# Patient Record
Sex: Male | Born: 1955
Health system: Southern US, Community
[De-identification: ages and names within clinical notes are randomized; demographics above are authoritative.]

## PROBLEM LIST (undated history)

## (undated) DIAGNOSIS — I10 Essential (primary) hypertension: Secondary | ICD-10-CM

## (undated) DIAGNOSIS — E785 Hyperlipidemia, unspecified: Secondary | ICD-10-CM

## (undated) DIAGNOSIS — R200 Anesthesia of skin: Secondary | ICD-10-CM

## (undated) DIAGNOSIS — I2510 Atherosclerotic heart disease of native coronary artery without angina pectoris: Secondary | ICD-10-CM

## (undated) DIAGNOSIS — R351 Nocturia: Secondary | ICD-10-CM

## (undated) DIAGNOSIS — E119 Type 2 diabetes mellitus without complications: Secondary | ICD-10-CM

## (undated) DIAGNOSIS — I519 Heart disease, unspecified: Secondary | ICD-10-CM

## (undated) DIAGNOSIS — N400 Enlarged prostate without lower urinary tract symptoms: Secondary | ICD-10-CM

## (undated) DIAGNOSIS — C189 Malignant neoplasm of colon, unspecified: Secondary | ICD-10-CM

## (undated) DIAGNOSIS — I219 Acute myocardial infarction, unspecified: Secondary | ICD-10-CM

## (undated) DIAGNOSIS — D649 Anemia, unspecified: Secondary | ICD-10-CM

## (undated) DIAGNOSIS — G629 Polyneuropathy, unspecified: Secondary | ICD-10-CM

## (undated) DIAGNOSIS — R911 Solitary pulmonary nodule: Secondary | ICD-10-CM

## (undated) DIAGNOSIS — K859 Acute pancreatitis without necrosis or infection, unspecified: Secondary | ICD-10-CM

## (undated) DIAGNOSIS — Z9289 Personal history of other medical treatment: Secondary | ICD-10-CM

## (undated) HISTORY — PX: CORONARY ANGIOPLASTY: SHX604

## (undated) HISTORY — DX: Malignant neoplasm of colon, unspecified: C18.9

## (undated) HISTORY — PX: COLONOSCOPY: SHX174

## (undated) HISTORY — PX: APPENDECTOMY: SHX54

## (undated) HISTORY — DX: Heart disease, unspecified: I51.9

## (undated) HISTORY — PX: OTHER SURGICAL HISTORY: SHX169

---

## 1989-12-24 HISTORY — PX: HERNIA REPAIR: SHX51

## 2012-12-24 DIAGNOSIS — I219 Acute myocardial infarction, unspecified: Secondary | ICD-10-CM

## 2012-12-24 HISTORY — DX: Acute myocardial infarction, unspecified: I21.9

## 2013-06-27 HISTORY — PX: CORONARY STENT PLACEMENT: SHX1402

## 2014-12-24 DIAGNOSIS — C189 Malignant neoplasm of colon, unspecified: Secondary | ICD-10-CM

## 2014-12-24 HISTORY — PX: LAPAROSCOPIC RIGHT COLECTOMY: SHX5925

## 2014-12-24 HISTORY — DX: Malignant neoplasm of colon, unspecified: C18.9

## 2015-03-07 ENCOUNTER — Encounter: Payer: Self-pay | Admitting: Neurology

## 2015-03-07 ENCOUNTER — Ambulatory Visit (INDEPENDENT_AMBULATORY_CARE_PROVIDER_SITE_OTHER): Payer: Managed Care, Other (non HMO) | Admitting: Neurology

## 2015-03-07 VITALS — BP 116/67 | HR 76 | Ht 75.0 in | Wt 183.0 lb

## 2015-03-07 DIAGNOSIS — M5416 Radiculopathy, lumbar region: Secondary | ICD-10-CM | POA: Diagnosis not present

## 2015-03-07 DIAGNOSIS — G609 Hereditary and idiopathic neuropathy, unspecified: Secondary | ICD-10-CM

## 2015-03-07 MED ORDER — GABAPENTIN 300 MG PO CAPS: 300.0000 mg | ORAL_CAPSULE | Freq: Three times a day (TID) | ORAL | Status: DC

## 2015-03-07 NOTE — Patient Instructions (Addendum)
Overall you are doing fairly well but I do want to suggest a few things today:   Remember to drink plenty of fluid, eat healthy meals and do not skip any meals. Try to eat protein with a every meal and eat a healthy snack such as fruit or nuts in between meals. Try to keep a regular sleep-wake schedule and try to exercise daily, particularly in the form of walking, 20-30 minutes a day, if you can.   As far as your medications are concerned, I would like to suggest: Neurontin 300mg  up to three times daily  As far as diagnostic testing: MRI of the lumbar spine  I would like to see you back in 4 months, sooner if we need to. Please call us with any interim questions, concerns, problems, updates or refill requests.   Please discuss right carotid with cardiologist  Please also call us for any test results so we can go over those with you on the phone.  My clinical assistant and will answer any of your questions and relay your messages to me and also relay most of my messages to you.   Our phone number is [phone]. We also have an after hours call service for urgent matters and there is a physician on-call for urgent questions. For any emergencies you know to call 911 or go to the nearest emergency room

## 2015-03-07 NOTE — Progress Notes (Signed)
[facility]    Provider:  Dr Jaynee Eagles Referring Provider: [name], MD Primary Care Physician:  [name], MD  CC:  Feet feel cold  HPI:  Allen Glenn is a 59 y.o. male here as a referral from Dr. Olen Pel for neuropathy, PMHx CAD s/p MI and an LAD stent on plavix, HTN.   His left foot feels cold, like on a block of ice at the toes. Doesn't feel cold to the touch. Color stays good. Right foot is fine. Started a year ago, getting worse. He tries to keep 3-4 socks on, doesn't help, heating pad doesn't help. Worse at night. Denies weakness. He has low back pain for years since a teenager. Progressing. Worsening weakness. He is going to a Restaurant manager, fast food. He has mid-upper back pain which is new. No burning, tingling, numbness. Some cramping in the hands last year, resolved. No cramping in the feet. Balance is good. +urinary frequency. +radicular symptoms. No significant weight loss. No FHx of neuropathy or neuromuscular disorders.   Review of Systems: Patient complains of symptoms per HPI as well as the following symptoms: chills, weight loss, fatigue, easy bruising, easy bleeding, feeling cold, joint pain, aching muscles, itching, runny nose, weakness, dizziness, insomnia, decreased energy. Pertinent negatives per HPI. All others negative.   History   Social History  . Marital Status: Married    Spouse Name: [name]  . Number of Children: 2  . Years of Education: Bachelor   Occupational History  . Minister    Social History Main Topics  . Smoking status: Never Smoker   . Smokeless tobacco: Not on file  . Alcohol Use: No  . Drug Use: No  . Sexual Activity: Not on file   Other Topics Concern  . Not on file   Social History Narrative   Live at home with wife.   Caffeine: very little soda, occasional tea. 12oz-16oz/per    History reviewed. No pertinent family history.  Past Medical History  Diagnosis Date  . Heart disease     Past Surgical History    Procedure Laterality Date  . Coronary stent placement  06-27-2013    Current Outpatient Prescriptions  Medication Sig Dispense Refill  . aspirin EC 81 MG tablet Take 81 mg by mouth daily.    . clopidogrel (PLAVIX) 75 MG tablet Take 75 mg by mouth daily.    . Gabapentin, Once-Daily, 600 MG TABS Take 600 mg by mouth at bedtime. 30 tablet 6  . lisinopril (PRINIVIL,ZESTRIL) 2.5 MG tablet Take 2.5 mg by mouth daily.    . Multiple Vitamin (MULTIVITAMIN) capsule Take 1 capsule by mouth.    . multivitamin (METANX) 3-35-2 MG TABS tablet Take 1 tablet by mouth 2 (two) times daily. 60 tablet 6  . nitroGLYCERIN (NITROSTAT) 0.4 MG SL tablet 1 TAB UNDER TONGUE EVERY 5 MINUTES FOR 3 DOSES AS NEEDED FOR CHEST PAIN. CALL 911 IF PAIN PERSISTS    . tamsulosin (FLOMAX) 0.4 MG CAPS capsule Take 0.4 mg by mouth daily.    Marland Kitchen zolpidem (AMBIEN) 10 MG tablet Take 10 mg by mouth daily.     No current facility-administered medications for this visit.    Allergies as of 03/07/2015  . (No Known Allergies)    Vitals: BP 116/67 mmHg  Pulse 76  Ht 6\' 3"  (1.905 m)  Wt 183 lb (83.008 kg)  BMI 22.87 kg/m2 Last Weight:  Wt Readings from Last 1 Encounters:  03/07/15 183 lb (83.008 kg)   Last Height:  Ht Readings from Last 1 Encounters:  03/07/15 6\' 3"  (1.905 m)   Physical exam: Exam: Gen: NAD, conversant, well nourised, well groomed                     CV: RRR, no MRG. Right carotid bruit. No peripheral edema, warm, nontender Eyes: Conjunctivae clear without exudates or hemorrhage  Neuro: Detailed Neurologic Exam  Speech:    Speech is normal; fluent and spontaneous with normal comprehension.  Cognition:    The patient is oriented to person, place, and time;     recent and remote memory intact;     language fluent;     normal attention, concentration,     fund of knowledge Cranial Nerves:    The pupils are equal, round, and reactive to light. The fundi are flat. Visual fields are full to finger  confrontation. Extraocular movements are intact. Trigeminal sensation is intact and the muscles of mastication are normal. The face is symmetric. The palate elevates in the midline. Hearing intact. Voice is normal. Shoulder shrug is normal. The tongue has normal motion without fasciculations.   Coordination:    Normal finger to nose and heel to shin.   Gait:    Heel-toe intact, difficulty with tandem  Motor Observation:    No asymmetry, no atrophy, and no involuntary movements noted. Tone:    Normal muscle tone.    Posture:    Posture is normal.    Strength:    Strength is V/V in the upper and lower limbs.      Sensation: Decreased to all modalities in glove-and stocking distribution     Reflex Exam:  DTR's:    Absent achilles Toes:    The toes are downgoing bilaterally.   Clonus:    Clonus is absent.    Assessment/Plan:    Allen Glenn is a 59 y.o. male here as a referral from Dr. Olen Pel for neuropathy, PMHx CAD s/p MI and an LAD stent on plavix, HTN. 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 However symptoms per patient are asymmetric with chronic low back pain so should evaluate MRi of the lumbar spine for radiculopathy.   Will order a serum neuropathy panel MRI of the lumbar spine Will see if Neurontin will help paresthesias Metanx and alpha-lipoic acid may help if diabetic polyneuropathy Right carotid bruit heard, patient will follow up with his cardiologist about it, he has an upcoming appointment.  Sarina Ill, MD  [facility] [address] [place], [place] [address]  Phone [phone] Fax [phone]

## 2015-03-08 LAB — PAN-ANCA
ANCA Proteinase 3: <3.5 U/mL (ref 0.0–3.5)
Atypical pANCA: <1:20 {titer}
C-ANCA: <1:20 {titer}
Myeloperoxidase Ab: <9 U/mL (ref 0.0–9.0)
P-ANCA: <1:20 {titer}

## 2015-03-08 LAB — B. BURGDORFI ANTIBODIES: Lyme IgG/IgM Ab: <0.91 {ISR} (ref 0.00–0.90)

## 2015-03-10 LAB — COMPREHENSIVE METABOLIC PANEL
A/G RATIO: 1.9 (ref 1.1–2.5)
ALBUMIN: 4.4 g/dL (ref 3.5–5.5)
ALT: 15 IU/L (ref 0–44)
AST: 14 IU/L (ref 0–40)
Alkaline Phosphatase: 71 IU/L (ref 39–117)
BUN/Creatinine Ratio: 15 (ref 9–20)
BUN: 12 mg/dL (ref 6–24)
Bilirubin Total: 0.3 mg/dL (ref 0.0–1.2)
CALCIUM: 8.6 mg/dL — AB (ref 8.7–10.2)
CO2: 23 mmol/L (ref 18–29)
CREATININE: 0.8 mg/dL (ref 0.76–1.27)
Chloride: 103 mmol/L (ref 97–108)
GFR calc Af Amer: 114 mL/min/{1.73_m2} (ref >59)
GFR calc non Af Amer: 98 mL/min/{1.73_m2} (ref >59)
GLOBULIN, TOTAL: 2.3 g/dL (ref 1.5–4.5)
Glucose: 114 mg/dL — ABNORMAL HIGH (ref 65–99)
Potassium: 4.6 mmol/L (ref 3.5–5.2)
Sodium: 141 mmol/L (ref 134–144)
Total Protein: 6.7 g/dL (ref 6.0–8.5)

## 2015-03-10 LAB — B12 AND FOLATE PANEL
Folate: >20 ng/mL (ref >3.0)
Vitamin B-12: 421 pg/mL (ref 211–946)

## 2015-03-10 LAB — VITAMIN B1: Thiamine: 126.5 nmol/L (ref 66.5–200.0)

## 2015-03-10 LAB — MULTIPLE MYELOMA PANEL, SERUM
ALPHA2 GLOB SERPL ELPH-MCNC: 0.6 g/dL (ref 0.4–1.2)
Albumin SerPl Elph-Mcnc: 4 g/dL (ref 3.2–5.6)
Albumin/Glob SerPl: 1.5 (ref 0.7–2.0)
Alpha 1: 0.3 g/dL (ref 0.1–0.4)
B-Globulin SerPl Elph-Mcnc: 1.1 g/dL (ref 0.6–1.3)
GAMMA GLOB SERPL ELPH-MCNC: 0.8 g/dL (ref 0.5–1.6)
Globulin, Total: 2.7 g/dL (ref 2.0–4.5)
IgA/Immunoglobulin A, Serum: 105 mg/dL (ref 90–386)
IgG (Immunoglobin G), Serum: 842 mg/dL (ref 700–1600)
IgM (Immunoglobulin M), Srm: 16 mg/dL — ABNORMAL LOW (ref 20–172)

## 2015-03-10 LAB — ANGIOTENSIN CONVERTING ENZYME: Angio Convert Enzyme: 18 U/L (ref 14–82)

## 2015-03-10 LAB — HEPATITIS C ANTIBODY: Hep C Virus Ab: <0.1 s/co ratio (ref 0.0–0.9)

## 2015-03-10 LAB — ANA W/REFLEX: ANA: NEGATIVE

## 2015-03-10 LAB — HEMOGLOBIN A1C
ESTIMATED AVERAGE GLUCOSE: 137 mg/dL
Hgb A1c MFr Bld: 6.4 % — ABNORMAL HIGH (ref 4.8–5.6)

## 2015-03-10 LAB — VITAMIN B6: Vitamin B6: 13.7 ug/L (ref 5.3–46.7)

## 2015-03-10 LAB — HIV ANTIBODY (ROUTINE TESTING W REFLEX): HIV SCREEN 4TH GENERATION: NONREACTIVE

## 2015-03-10 LAB — TSH: TSH: 1.77 u[IU]/mL (ref 0.450–4.500)

## 2015-03-10 LAB — RPR: RPR: NONREACTIVE

## 2015-03-10 LAB — RHEUMATOID FACTOR: RHEUMATOID FACTOR: 9 [IU]/mL (ref 0.0–13.9)

## 2015-03-12 ENCOUNTER — Other Ambulatory Visit: Payer: Self-pay | Admitting: Neurology

## 2015-03-12 ENCOUNTER — Encounter: Payer: Self-pay | Admitting: Neurology

## 2015-03-12 ENCOUNTER — Telehealth: Payer: Self-pay | Admitting: Neurology

## 2015-03-12 DIAGNOSIS — E0842 Diabetes mellitus due to underlying condition with diabetic polyneuropathy: Secondary | ICD-10-CM

## 2015-03-12 DIAGNOSIS — E539 Vitamin B deficiency, unspecified: Secondary | ICD-10-CM

## 2015-03-12 MED ORDER — GABAPENTIN (ONCE-DAILY) 600 MG PO TABS: 600.0000 mg | ORAL_TABLET | Freq: Every day | ORAL | Status: DC

## 2015-03-12 MED ORDER — METANX 3-35-2 MG PO TABS: 1.0000 | ORAL_TABLET | Freq: Two times a day (BID) | ORAL | Status: DC

## 2015-03-12 NOTE — Telephone Encounter (Signed)
Spoke with patient. Labwork negative except HgbA1c 6.4 which can be contributing to neuropathy. Neurontin is helping, difficult to take 3x a day and wears off in between. Will try Gralise extended release. Will also prescribe metanx for diabetic neuropathy. And advised alha-lipoic acid 400-600mg  daily.  Allen Glenn - can you please follow up on his MRI approval and also schedule a follow up in 4 months when you call him to let him know about the MRI. Thank you.

## 2015-03-14 ENCOUNTER — Telehealth: Payer: Self-pay | Admitting: Neurology

## 2015-03-14 NOTE — Telephone Encounter (Signed)
[facility] - Would you please call patient and let them know the insurance denied MRI of the lumbar spine. They want [name] to do an emg.ncs first which I am happy to do on his left leg. He has polyneuropathy but an emg/ncs  can provide additional information on whether there is a pinched nerve on the left contributing to his symptoms. If we want an mrI of the lumbar spine, insurance says we need to do this test first. Please ask patient. It involves some electrical stimuli to examine the nerves in his legs and then I take a small needle/filament and examine muscles to see if I can find a pinched nerve in his back. Up to the patient. I can call and discuss if he likes as well. Just let him know. thanks

## 2015-03-14 NOTE — Telephone Encounter (Signed)
Talked with patient about insurance not covering the MRI lumbar/spine because they would like him to do the emg/ncs first. I talked with his about the ncs/emg procedure per Dr. Jaynee Eagles notes to see if he was interested and he was. But he wants [name] to check and see if insurance will cover it first before he agrees to it. I told him I would check for him and call him back tomorrow. Pt verbalized understanding.

## 2015-03-14 NOTE — Telephone Encounter (Signed)
John, Pharmacist with [facility] @ [phone], questioning Rx for GABAPENTIN 600 mg 1 tab daily.  Questioning if Extended Release or Immediate Release is needed.  Please call and advise.

## 2015-03-14 NOTE — Telephone Encounter (Signed)
Last phone note says: Neurontin is helping, difficult to take 3x a day and wears off in between. Will try Gralise extended release I called the pharmacy back.  Spoke with Joe.  Relayed providers message.  He will note patient file.

## 2015-03-14 NOTE — Telephone Encounter (Signed)
Talked with patient and told him I got the diagnosis codes so he could call his insurance company to find out the cost. The diagnosis code for EMG is 95886. The diagnosis codes range from [address]10, [place], [phone], and [phone] depending on how many nerves they test. Pt said he would call back tomorrow to get the codes because he was on the road right now. Please give diagnosis codes if he calls back.

## 2015-03-15 ENCOUNTER — Other Ambulatory Visit: Payer: Self-pay | Admitting: Neurology

## 2015-03-15 DIAGNOSIS — R202 Paresthesia of skin: Secondary | ICD-10-CM

## 2015-03-15 NOTE — Telephone Encounter (Signed)
Thank you. I ordered the emg/ncs just in case. So he can call and schedule at his convenience if insurance will pay.

## 2015-03-16 ENCOUNTER — Telehealth: Payer: Self-pay | Admitting: *Deleted

## 2015-03-16 NOTE — Telephone Encounter (Signed)
Called pt and gave him the diagnosis codes to call insurance company and see how much ncs/emg will cost. I told him to call back with any questions. Pt verbalized understanding.

## 2015-03-28 ENCOUNTER — Telehealth: Payer: Self-pay | Admitting: Neurology

## 2015-03-28 NOTE — Telephone Encounter (Signed)
Talked with pt and advised him to continue taking gabapentin 600 per Dr. Jaynee Eagles. Pt verbalized understanding.

## 2015-03-28 NOTE — Telephone Encounter (Signed)
Patient is calling in regard to NCV-EMG test tomorrow and would like to know if he should discontinue Rx Gabapentin 600.  Please call.

## 2015-03-28 NOTE — Telephone Encounter (Signed)
Agreed, Thank you

## 2015-03-29 ENCOUNTER — Ambulatory Visit (INDEPENDENT_AMBULATORY_CARE_PROVIDER_SITE_OTHER): Payer: Self-pay | Admitting: Neurology

## 2015-03-29 ENCOUNTER — Ambulatory Visit (INDEPENDENT_AMBULATORY_CARE_PROVIDER_SITE_OTHER): Payer: Managed Care, Other (non HMO) | Admitting: Neurology

## 2015-03-29 DIAGNOSIS — R202 Paresthesia of skin: Secondary | ICD-10-CM

## 2015-03-29 DIAGNOSIS — M5416 Radiculopathy, lumbar region: Secondary | ICD-10-CM

## 2015-03-29 DIAGNOSIS — R0989 Other specified symptoms and signs involving the circulatory and respiratory systems: Secondary | ICD-10-CM

## 2015-03-29 MED ORDER — PREGABALIN 150 MG PO CAPS: ORAL_CAPSULE | ORAL | Status: DC

## 2015-03-29 NOTE — Patient Instructions (Addendum)
Overall you are doing fairly well but I do want to suggest a few things today:   Remember to drink plenty of fluid, eat healthy meals and do not skip any meals. Try to eat protein with a every meal and eat a healthy snack such as fruit or nuts in between meals. Try to keep a regular sleep-wake schedule and try to exercise daily, particularly in the form of walking, 20-30 minutes a day, if you can.   As far as your medications are concerned, I would like to suggest: try lyrica 100 - 150mg  twice daily. Do not take with gabapentin.   As far as diagnostic testing: carotid dopplers, mri lumbar spine. [facility].   I would like to see you back as needed, sooner if we need to. Please call us with any interim questions, concerns, problems, updates or refill requests.   Please also call us for any test results so we can go over those with you on the phone.  My clinical assistant and will answer any of your questions and relay your messages to me and also relay most of my messages to you.   Our phone number is [phone]. We also have an after hours call service for urgent matters and there is a physician on-call for urgent questions. For any emergencies you know to call 911 or go to the nearest emergency room

## 2015-03-29 NOTE — Procedures (Signed)
  [facility]    Provider:  Dr Jaynee Eagles Referring Provider: [name], MD Primary Care Physician:  [name], MD   HPI: Allen Glenn is a 59 y.o. male here as a referral from Dr. Olen Pel for neuropathy, PMHx CAD s/p MI and an LAD stent on plavix, HTN.   His left foot feels cold, like on a block of ice at the toes. Doesn't feel cold to the touch. Color stays good. Right foot is fine. Started a year ago, getting worse. He tries to keep 3-4 socks on, doesn't help, heating pad doesn't help. Worse at night. Denies weakness. He has low back pain for years since a teenager. Progressing. Worsening weakness. He is going to a Restaurant manager, fast food. He has mid-upper back pain which is new. No burning, tingling, numbness. Some cramping in the hands last year, resolved. No cramping in the feet. Balance is good. +urinary frequency. +radicular symptoms. No significant weight loss. No FHx of neuropathy or neuromuscular disorders.   Summary: Nerve conduction studies were performed on the bilateral lower extremities.  Bilateral Peroneal and Tibial motor conductions were within normal limits with normal F Wave latencies.  Bilateral Sural and Peroneal sensory conductions were within normal limits Bilateral H Reflexes were within normal limits  EMG needle study was performed on selected left lower extremity muscles: The left  Gastrocnemius showed mildly increased spontaneous activity and reduced motor unit recruitment. The left Vastus Medialis, left Anterior Tibialis, left Extensor Hallucis Longus, Left Abductor Hallucis,  left Biceps Femoris (long  head), left Gluteus Medius, left Gluteus maximus muscles were within normal limits. Could not test the paraspinal deep muscles as patient is on Plavix and Aspirin which increases risk of bleeding.    Conclusion: This is an abnormal study. There are acute/ongoing denervation and chronic neurogenic changes seen in the left Medial Gastrocnemius. This  implies possible left radiculopathy with  S1 localization or may be due to neuropathy. Because of multiple anti-platelet agents, could not perform EMG needle study on paraspinal muscles to confirm radiculopathy. Suggest MRI of the lumbar spine. Clinical correlation recommended.     Sarina Ill, MD  [facility] [address] [place], [place] [address]  Phone [phone] Fax [phone]

## 2015-03-29 NOTE — Progress Notes (Signed)
  [facility]    Provider:  Dr Jaynee Eagles Referring Provider: [name], MD Primary Care Physician:  [name], MD   HPI: Allen Glenn is a 59 y.o. male here as a referral from Dr. Olen Pel for neuropathy, PMHx CAD s/p MI and an LAD stent on plavix, HTN.   His left foot feels cold, like on a block of ice at the toes. Doesn't feel cold to the touch. Color stays good. Right foot is fine. Started a year ago, getting worse. He tries to keep 3-4 socks on, doesn't help, heating pad doesn't help. Worse at night. Denies weakness. He has low back pain for years since a teenager. Progressing. Worsening weakness. He is going to a Restaurant manager, fast food. He has mid-upper back pain which is new. No burning, tingling, numbness. Some cramping in the hands last year, resolved. No cramping in the feet. Balance is good. +urinary frequency. +radicular symptoms. No significant weight loss. No FHx of neuropathy or neuromuscular disorders.   Summary: Nerve conduction studies were performed on the bilateral lower extremities.  Bilateral Peroneal and Tibial motor conductions were within normal limits with normal F Wave latencies.  Bilateral Sural and Peroneal sensory conductions were within normal limits Bilateral H Reflexes were within normal limits  EMG needle study was performed on selected left lower extremity muscles: The left  Gastrocnemius showed mildly increased spontaneous activity and reduced motor unit recruitment. The left Vastus Medialis, left Anterior Tibialis, left Extensor Hallucis Longus, Left Abductor Hallucis,  left Biceps Femoris (long  head), left Gluteus Medius, left Gluteus maximus muscles were within normal limits. Could not test the paraspinal deep muscles as patient is on Plavix and Aspirin which increases risk of bleeding.    Conclusion: This is an abnormal study. There are acute/ongoing denervation and chronic neurogenic changes seen in the left Medial Gastrocnemius. This  implies possible left radiculopathy with  S1 localization or may be due to neuropathy. Because of multiple anti-platelet agents, could not perform EMG needle study on paraspinal muscles to confirm radiculopathy. Suggest MRI of the lumbar spine. Clinical correlation recommended.     Sarina Ill, MD  [facility] [address] [place], [place] [address]  Phone [phone] Fax [phone]

## 2015-03-29 NOTE — Progress Notes (Signed)
See procedure note.

## 2015-04-06 ENCOUNTER — Ambulatory Visit (INDEPENDENT_AMBULATORY_CARE_PROVIDER_SITE_OTHER): Payer: Managed Care, Other (non HMO)

## 2015-04-06 DIAGNOSIS — R0989 Other specified symptoms and signs involving the circulatory and respiratory systems: Secondary | ICD-10-CM

## 2015-04-06 DIAGNOSIS — R202 Paresthesia of skin: Secondary | ICD-10-CM

## 2015-04-06 DIAGNOSIS — M5416 Radiculopathy, lumbar region: Secondary | ICD-10-CM | POA: Diagnosis not present

## 2015-04-09 ENCOUNTER — Observation Stay (HOSPITAL_COMMUNITY)
Admission: EM | Admit: 2015-04-09 | Discharge: 2015-04-10 | Disposition: A | Discharge status: Home / Self Care | Payer: Managed Care, Other (non HMO) | Attending: Internal Medicine | Admitting: Internal Medicine

## 2015-04-09 ENCOUNTER — Encounter (HOSPITAL_COMMUNITY): Payer: Self-pay | Admitting: Nurse Practitioner

## 2015-04-09 DIAGNOSIS — Z7982 Long term (current) use of aspirin: Secondary | ICD-10-CM | POA: Insufficient documentation

## 2015-04-09 DIAGNOSIS — Z955 Presence of coronary angioplasty implant and graft: Secondary | ICD-10-CM | POA: Insufficient documentation

## 2015-04-09 DIAGNOSIS — D649 Anemia, unspecified: Secondary | ICD-10-CM | POA: Insufficient documentation

## 2015-04-09 DIAGNOSIS — T39395A Adverse effect of other nonsteroidal anti-inflammatory drugs [NSAID], initial encounter: Secondary | ICD-10-CM

## 2015-04-09 DIAGNOSIS — D509 Iron deficiency anemia, unspecified: Secondary | ICD-10-CM | POA: Diagnosis not present

## 2015-04-09 DIAGNOSIS — K922 Gastrointestinal hemorrhage, unspecified: Secondary | ICD-10-CM | POA: Diagnosis not present

## 2015-04-09 DIAGNOSIS — I252 Old myocardial infarction: Secondary | ICD-10-CM | POA: Diagnosis not present

## 2015-04-09 DIAGNOSIS — R112 Nausea with vomiting, unspecified: Secondary | ICD-10-CM | POA: Diagnosis present

## 2015-04-09 DIAGNOSIS — I2510 Atherosclerotic heart disease of native coronary artery without angina pectoris: Secondary | ICD-10-CM | POA: Diagnosis present

## 2015-04-09 DIAGNOSIS — G609 Hereditary and idiopathic neuropathy, unspecified: Secondary | ICD-10-CM | POA: Diagnosis not present

## 2015-04-09 DIAGNOSIS — R5383 Other fatigue: Secondary | ICD-10-CM | POA: Diagnosis present

## 2015-04-09 DIAGNOSIS — I10 Essential (primary) hypertension: Secondary | ICD-10-CM | POA: Diagnosis not present

## 2015-04-09 DIAGNOSIS — R0683 Snoring: Secondary | ICD-10-CM | POA: Insufficient documentation

## 2015-04-09 HISTORY — DX: Atherosclerotic heart disease of native coronary artery without angina pectoris: I25.10

## 2015-04-09 HISTORY — DX: Acute myocardial infarction, unspecified: I21.9

## 2015-04-09 LAB — CBC
HCT: 28.6 % — ABNORMAL LOW (ref 39.0–52.0)
Hemoglobin: 7.4 g/dL — ABNORMAL LOW (ref 13.0–17.0)
MCH: 16.2 pg — ABNORMAL LOW (ref 26.0–34.0)
MCHC: 25.9 g/dL — ABNORMAL LOW (ref 30.0–36.0)
MCV: 62.6 fL — ABNORMAL LOW (ref 78.0–100.0)
Platelets: 250 10*3/uL (ref 150–400)
RBC: 4.57 MIL/uL (ref 4.22–5.81)
RDW: 17.8 % — ABNORMAL HIGH (ref 11.5–15.5)
WBC: 4 10*3/uL (ref 4.0–10.5)

## 2015-04-09 LAB — POC OCCULT BLOOD, ED: FECAL OCCULT BLD: NEGATIVE

## 2015-04-09 LAB — RETICULOCYTES
RBC.: 4.57 MIL/uL (ref 4.22–5.81)
RETIC CT PCT: 1.3 % (ref 0.4–3.1)
Retic Count, Absolute: 59.4 10*3/uL (ref 19.0–186.0)

## 2015-04-09 LAB — COMPREHENSIVE METABOLIC PANEL
ALBUMIN: 3.9 g/dL (ref 3.5–5.2)
ALK PHOS: 63 U/L (ref 39–117)
ALT: 15 U/L (ref 0–53)
AST: 25 U/L (ref 0–37)
Anion gap: 9 (ref 5–15)
BILIRUBIN TOTAL: 0.5 mg/dL (ref 0.3–1.2)
BUN: 14 mg/dL (ref 6–23)
CALCIUM: 8.5 mg/dL (ref 8.4–10.5)
CHLORIDE: 103 mmol/L (ref 96–112)
CO2: 24 mmol/L (ref 19–32)
Creatinine, Ser: 0.76 mg/dL (ref 0.50–1.35)
GFR calc Af Amer: >90 mL/min (ref >90)
GFR calc non Af Amer: >90 mL/min (ref >90)
Glucose, Bld: 102 mg/dL — ABNORMAL HIGH (ref 70–99)
POTASSIUM: 4.1 mmol/L (ref 3.5–5.1)
SODIUM: 136 mmol/L (ref 135–145)
Total Protein: 6.7 g/dL (ref 6.0–8.3)

## 2015-04-09 LAB — IRON AND TIBC
IRON: 24 ug/dL — AB (ref 42–165)
Saturation Ratios: 5 % — ABNORMAL LOW (ref 20–55)
TIBC: 501 ug/dL — AB (ref 215–435)
UIBC: 477 ug/dL — ABNORMAL HIGH (ref 125–400)

## 2015-04-09 LAB — VITAMIN B12: Vitamin B-12: 420 pg/mL (ref 211–911)

## 2015-04-09 LAB — FOLATE

## 2015-04-09 LAB — PREPARE RBC (CROSSMATCH)

## 2015-04-09 LAB — ABO/RH: ABO/RH(D): A NEG

## 2015-04-09 LAB — FERRITIN: Ferritin: 4 ng/mL — ABNORMAL LOW (ref 22–322)

## 2015-04-09 MED ORDER — SODIUM CHLORIDE 0.9 % IV SOLN: 10.0000 mL/h | Freq: Once | INTRAVENOUS | Status: DC

## 2015-04-09 MED ORDER — PREGABALIN 75 MG PO CAPS: 150.0000 mg | ORAL_CAPSULE | Freq: Every evening | ORAL | Status: DC | PRN

## 2015-04-09 MED ORDER — ASPIRIN EC 81 MG PO TBEC
81.0000 mg | DELAYED_RELEASE_TABLET | Freq: Every day | ORAL | Status: DC
  Administered 2015-04-10: 81 mg via ORAL
  Filled 2015-04-09 (×3): qty 1

## 2015-04-09 MED ORDER — GABAPENTIN (ONCE-DAILY) 600 MG PO TABS: 600.0000 mg | ORAL_TABLET | Freq: Every day | ORAL | Status: DC

## 2015-04-09 MED ORDER — METANX 3-35-2 MG PO TABS: 1.0000 | ORAL_TABLET | Freq: Two times a day (BID) | ORAL | Status: DC

## 2015-04-09 MED ORDER — CLOPIDOGREL BISULFATE 75 MG PO TABS
75.0000 mg | ORAL_TABLET | Freq: Every day | ORAL | Status: DC
  Administered 2015-04-10: 75 mg via ORAL
  Filled 2015-04-09 (×3): qty 1

## 2015-04-09 MED ORDER — TROLAMINE SALICYLATE 10 % EX CREA: 1.0000 "application " | TOPICAL_CREAM | Freq: Every day | CUTANEOUS | Status: DC

## 2015-04-09 MED ORDER — TAMSULOSIN HCL 0.4 MG PO CAPS
0.4000 mg | ORAL_CAPSULE | Freq: Every day | ORAL | Status: DC
  Administered 2015-04-10: 0.4 mg via ORAL
  Filled 2015-04-09 (×3): qty 1

## 2015-04-09 MED ORDER — ZOLPIDEM TARTRATE 5 MG PO TABS
10.0000 mg | ORAL_TABLET | Freq: Every day | ORAL | Status: DC
  Administered 2015-04-09: 10 mg via ORAL
  Filled 2015-04-09: qty 2

## 2015-04-09 MED ORDER — LISINOPRIL 2.5 MG PO TABS
2.5000 mg | ORAL_TABLET | Freq: Every day | ORAL | Status: DC
  Administered 2015-04-10: 2.5 mg via ORAL
  Filled 2015-04-09 (×3): qty 1

## 2015-04-09 MED ORDER — SODIUM CHLORIDE 0.9 % IJ SOLN: 3.0000 mL | Freq: Two times a day (BID) | INTRAMUSCULAR | Status: DC

## 2015-04-09 MED ORDER — PANTOPRAZOLE SODIUM 40 MG PO TBEC
40.0000 mg | DELAYED_RELEASE_TABLET | Freq: Two times a day (BID) | ORAL | Status: DC
  Administered 2015-04-09 – 2015-04-10 (×3): 40 mg via ORAL
  Filled 2015-04-09 (×2): qty 1

## 2015-04-09 NOTE — ED Notes (Signed)
PA at bedside.

## 2015-04-09 NOTE — ED Notes (Signed)
Patient presents to ED for further evaluation of hemoglobin of 6.9 that was obtained in pre-cardiac catheterization blood work. Cath is scheduled for Friday for evaluation for intermittent chest pain, and worsening dyspnea and fatigue. Patient states symptoms have been progressive over the past five months. Patient endorses intermittent dizziness with position changes. Patient pale, able to speak full sentences, skin warm and dry    Had 1 stent placed in 2015

## 2015-04-09 NOTE — ED Notes (Signed)
Attempted report 

## 2015-04-09 NOTE — ED Provider Notes (Signed)
CSN: [id number]     Arrival date & time 04/09/15  0940 History   First MD Initiated Contact with Patient 04/09/15 [phone]     Chief Complaint  Patient presents with  . low hemoglobin      (Consider location/radiation/quality/duration/timing/severity/associated sxs/prior Treatment) The history is provided by the patient and medical records.    This is a 59 year old male with past medical history significant for coronary artery disease, MI status post stenting, hypertension, presenting to the ED for low hemoglobin. Patient states he was seen earlier this morning for pre-cardiac catheterization blood work and was noted that his hemoglobin was low at 6.9. His baseline is around 12-13.  He denies any melena or bright red blood per rectum. He is on aspirin and Plavix. No issues with anemia in the past. He had a colonoscopy a few years ago which was normal per his reports. Patient and wife report that over the past 2 months he has had generalized weakness, fatigue, and low energy.  He is having cardiac cath for exertional chest pain and dyspnea which is been ongoing for the past 2 years.  He denies any current chest pain, shortness of breath, abdominal pain, nausea, or vomiting.  Past Medical History  Diagnosis Date  . Heart disease   . Coronary artery disease   . Acute MI 2014    acute ST elevation MI   Past Surgical History  Procedure Laterality Date  . Coronary stent placement  06-27-2013   Family History  Problem Relation Age of Onset  . Neuropathy Neg Hx    History  Substance Use Topics  . Smoking status: Never Smoker   . Smokeless tobacco: Not on file  . Alcohol Use: No    Review of Systems  Constitutional: Positive for activity change and fatigue.  Neurological: Positive for weakness (general).  All other systems reviewed and are negative.     Allergies  Review of patient's allergies indicates no known allergies.  Home Medications   Prior to Admission medications    Medication Sig Start Date End Date Taking? Authorizing Provider  ALPHA LIPOIC ACID PO Take 1 capsule by mouth daily.   Yes Historical Provider, MD  aspirin EC 81 MG tablet Take 81 mg by mouth daily. 06/30/13  Yes Historical Provider, MD  Calcium-Magnesium-Vitamin D (CALCIUM MAGNESIUM PO) Take 1 tablet by mouth daily.   Yes Historical Provider, MD  clopidogrel (PLAVIX) 75 MG tablet Take 75 mg by mouth daily. 02/16/15  Yes Historical Provider, MD  lisinopril (PRINIVIL,ZESTRIL) 2.5 MG tablet Take 2.5 mg by mouth daily. 02/16/15  Yes Historical Provider, MD  Multiple Vitamin (MULTIVITAMIN) capsule Take 1 capsule by mouth.   Yes Historical Provider, MD  pregabalin (LYRICA) 50 MG capsule Take 50-150 mg by mouth 2 (two) times daily.   Yes Historical Provider, MD  tamsulosin (FLOMAX) 0.4 MG CAPS capsule Take 0.4 mg by mouth daily. 02/24/15  Yes Historical Provider, MD  trolamine salicylate (ASPERCREME) 10 % cream Apply 1 application topically daily. To back   Yes Historical Provider, MD  zolpidem (AMBIEN) 10 MG tablet Take 10 mg by mouth daily. 12/27/14  Yes Historical Provider, MD  Gabapentin, Once-Daily, 600 MG TABS Take 600 mg by mouth at bedtime. Patient not taking: Reported on 04/09/2015 03/12/15   Melvenia Beam, MD  multivitamin (METANX) 3-35-2 MG TABS tablet Take 1 tablet by mouth 2 (two) times daily. Patient not taking: Reported on 04/09/2015 03/12/15   Melvenia Beam, MD  nitroGLYCERIN (NITROSTAT) 0.4  MG SL tablet 1 TAB UNDER TONGUE EVERY 5 MINUTES FOR 3 DOSES AS NEEDED FOR CHEST PAIN. CALL 911 IF PAIN PERSISTS 11/24/14   Historical Provider, MD  pregabalin (LYRICA) 150 MG capsule Take 50 to 150mg  twice daily Patient not taking: Reported on 04/09/2015 03/29/15   Melvenia Beam, MD   BP 121/59 mmHg  Pulse 75  Temp(Src) 98 F (36.7 C) (Oral)  Resp 16  Ht 6\' 2"  (1.88 m)  Wt 182 lb (82.555 kg)  BMI 23.36 kg/m2  SpO2 100%   Physical Exam  Constitutional: He is oriented to person, place, and time. He  appears well-developed and well-nourished. No distress.  HENT:  Head: Normocephalic and atraumatic.  Mouth/Throat: Oropharynx is clear and moist.  Eyes: Conjunctivae and EOM are normal. Pupils are equal, round, and reactive to light.  Conjunctiva pale  Neck: Normal range of motion. Neck supple.  Cardiovascular: Normal rate, regular rhythm and normal heart sounds.   Pulmonary/Chest: Effort normal and breath sounds normal. No respiratory distress. He has no wheezes.  Abdominal: Soft. Bowel sounds are normal. There is no tenderness. There is no guarding.  Musculoskeletal: Normal range of motion. He exhibits no edema.  Neurological: He is alert and oriented to person, place, and time.  Skin: Skin is warm and dry. He is not diaphoretic.  Psychiatric: He has a normal mood and affect.  Nursing note and vitals reviewed.   ED Course  Procedures (including critical care time)  CRITICAL CARE Performed by: Larene Pickett   Total critical care time: 35  Critical care time was exclusive of separately billable procedures and treating other patients.  Critical care was necessary to treat or prevent imminent or life-threatening deterioration.  Critical care was time spent personally by me on the following activities: development of treatment plan with patient and/or surrogate as well as nursing, discussions with consultants, evaluation of patient's response to treatment, examination of patient, obtaining history from patient or surrogate, ordering and performing treatments and interventions, ordering and review of laboratory studies, ordering and review of radiographic studies, pulse oximetry and re-evaluation of patient's condition.  Medications  0.9 %  sodium chloride infusion (not administered)    Labs Review Labs Reviewed  CBC - Abnormal; Notable for the following:    Hemoglobin 7.4 (*)    HCT 28.6 (*)    MCV 62.6 (*)    MCH 16.2 (*)    MCHC 25.9 (*)    RDW 17.8 (*)    All other  components within normal limits  COMPREHENSIVE METABOLIC PANEL - Abnormal; Notable for the following:    Glucose, Bld 102 (*)    All other components within normal limits  RETICULOCYTES  VITAMIN B12  FOLATE  IRON AND TIBC  FERRITIN  POC OCCULT BLOOD, ED  TYPE AND SCREEN  ABO/RH  PREPARE RBC (CROSSMATCH)    Imaging Review No results found.   EKG Interpretation   Date/Time:  Saturday April 09 2015 09:48:52 EDT Ventricular Rate:  73 PR Interval:  201 QRS Duration: 93 QT Interval:  389 QTC Calculation: 429 R Axis:   49 Text Interpretation:  Poor data quality Sinus rhythm Baseline wander  Artifact No old tracing to compare Confirmed by [facility]  MD, Nunzio Cory  [phone]) on 04/09/2015 10:18:04 AM      MDM   Final diagnoses:  Symptomatic anemia  Other fatigue   59 year old male with low hemoglobin on outpatient labs prior to cardiac catherization. Hemoglobin was noted to be 6.9, his baseline  is around 12-13 based on prior lab values in chart. Patient denies any blood in stool. He does report fatigue, generalized weakness, and decreased energy. Labwork as above, hemoglobin 7.4. Hemoccult is negative. Patient is not orthostatic. Anemia panel pending. Given the patient is symptomatic and is a precipitous drop from his baseline, feel he would benefit from blood transfusion and admission for further management. This was discussed with patient and his wife who are in agreement. Questions regarding blood transfusion were answered, consent obtained.  2 units type and screened.  Case discussed with hospitalist, Dr. Verlon Au who will admit for observation.  Temp admission orders placed, VS remain stable.  Larene Pickett, PA-C 04/09/15 [place], PA-C 04/09/15 [place], DO 04/10/15 1356

## 2015-04-09 NOTE — H&P (Signed)
[facility] History and Physical  Allen Glenn MWN:[id number] DOB: 1956/01/01 DOA: 04/09/2015  Referring physician: ED PCP: [name], MD  Specialists:  none  Chief Complaint:  59 y/o ? 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 Congenital diastematomyelia + LDDD-follwed by Dr. Jaynee Eagles of Neurology [W/u in process] Went to [facility]. [facility] for pre-cardiac cath labs. He is been feeling weak and tired loss of energy and washed out with exertion over the past 6 months. He is been seen by another physician Dr. Maureen Chatters 2015 and had some lab work done which is not available. He actually underwent a stress test in December 2015 which was within normal limits His wife is an Therapist, sports and gives supplementary history. She states that he has been tired for "long time"  Hb 7.4, MCV 62 Chem-7 wnl   Review of Systems:  - Chest pain + fatigue as above - Hematemesis, - diarrhea, - blurred vision, - double vision, - unilateral loss of strength, - fever, - chills, - sinus issues,  - Lower extremity swelling, - orthopnea, - PND   + Occasional nausea-has come to the point where he has emesis 1-2 times a day for the past 6 months + Aversion to foods and smell causes the nausea - Dysphagia, - odynophagia, - dysphagia to various types of foods, - sore throat   Past Medical History  Diagnosis Date  . Heart disease   . Coronary artery disease   . Acute MI 2014    acute ST elevation MI   Past Surgical History  Procedure Laterality Date  . Coronary stent placement  06-27-2013   Social History:  History   Social History Narrative   Live at home with wife.   Caffeine: very little soda, occasional tea. 12oz-16oz/per    No Known Allergies  Family History  Problem Relation Age of Onset  . Neuropathy Neg Hx     Prior to Admission medications   Medication Sig Start Date End Date  Taking? Authorizing Provider  ALPHA LIPOIC ACID PO Take 1 capsule by mouth daily.   Yes Historical Provider, MD  aspirin EC 81 MG tablet Take 81 mg by mouth daily. 06/30/13  Yes Historical Provider, MD  Calcium-Magnesium-Vitamin D (CALCIUM MAGNESIUM PO) Take 1 tablet by mouth daily.   Yes Historical Provider, MD  clopidogrel (PLAVIX) 75 MG tablet Take 75 mg by mouth daily. 02/16/15  Yes Historical Provider, MD  lisinopril (PRINIVIL,ZESTRIL) 2.5 MG tablet Take 2.5 mg by mouth daily. 02/16/15  Yes Historical Provider, MD  Multiple Vitamin (MULTIVITAMIN) capsule Take 1 capsule by mouth.   Yes Historical Provider, MD  pregabalin (LYRICA) 50 MG capsule Take 50-150 mg by mouth 2 (two) times daily.   Yes Historical Provider, MD  tamsulosin (FLOMAX) 0.4 MG CAPS capsule Take 0.4 mg by mouth daily. 02/24/15  Yes Historical Provider, MD  trolamine salicylate (ASPERCREME) 10 % cream Apply 1 application topically daily. To back   Yes Historical Provider, MD  zolpidem (AMBIEN) 10 MG tablet Take 10 mg by mouth daily. 12/27/14  Yes Historical Provider, MD  Gabapentin, Once-Daily, 600 MG TABS Take 600 mg by mouth at bedtime. Patient not taking: Reported on 04/09/2015 03/12/15   Melvenia Beam, MD  multivitamin (METANX) 3-35-2 MG TABS tablet Take 1 tablet by mouth 2 (two) times daily. Patient not taking: Reported on 04/09/2015 03/12/15   Melvenia Beam, MD  nitroGLYCERIN (NITROSTAT)  0.4 MG SL tablet 1 TAB UNDER TONGUE EVERY 5 MINUTES FOR 3 DOSES AS NEEDED FOR CHEST PAIN. CALL 911 IF PAIN PERSISTS 11/24/14   Historical Provider, MD  pregabalin (LYRICA) 150 MG capsule Take 50 to 150mg  twice daily Patient not taking: Reported on 04/09/2015 03/29/15   Melvenia Beam, MD   Physical Exam: Filed Vitals:   04/09/15 0950 04/09/15 1100 04/09/15 1236 04/09/15 1238  BP: 121/59  126/52   Pulse: 75  71   Temp: 98 F (36.7 C)   97.8 F (36.6 C)  TempSrc: Oral Oral  Oral  Resp: 16  20   Height: 6\' 2"  (1.88 m)     Weight: 82.555 kg  (182 lb)     SpO2: 100%  100%     EOMI ruddy complexion, mild pallor, no icterus Neck soft supple, no bruit, no JVD S1-S2?murmur left lower sternal edge,  CTA B no added sound Abdomen soft nontender nondistended no rebound no epigastric tenderness no splenomegaly no organomegaly bowel sounds heard Lower extremities is soft supple  Labs on Admission:  Basic Metabolic Panel:  Recent Labs Lab 04/09/15 0957  NA 136  K 4.1  CL 103  CO2 24  GLUCOSE 102*  BUN 14  CREATININE 0.76  CALCIUM 8.5   Liver Function Tests:  Recent Labs Lab 04/09/15 0957  AST 25  ALT 15  ALKPHOS 63  BILITOT 0.5  PROT 6.7  ALBUMIN 3.9   No results for input(s): LIPASE, AMYLASE in the last 168 hours. No results for input(s): AMMONIA in the last 168 hours. CBC:  Recent Labs Lab 04/09/15 0957  WBC 4.0  HGB 7.4*  HCT 28.6*  MCV 62.6*  PLT 250   Cardiac Enzymes: No results for input(s): CKTOTAL, CKMB, CKMBINDEX, TROPONINI in the last 168 hours.  BNP (last 3 results) No results for input(s): BNP in the last 8760 hours.  ProBNP (last 3 results) No results for input(s): PROBNP in the last 8760 hours.  CBG: No results for input(s): GLUCAP in the last 168 hours.  Radiological Exams on Admission: No results found.  EKG: Independently reviewed. Sinus rhythm, PR interval 0.20, QRS axis 50, poor quality EKG and wandering baseline,  Assessment/Plan Principal Problem:   GI bleed due to NSAIDs-probably secondary to use of Plavix and aspirin. He does not take any other nonsteroidal's that I'm able to tell. We will transfuse him 2 units packed red blood cells over 4 hours each unit repeat labs in the morning I will place him on Protonix twice a day 40 mg to see if this makes any difference If he still has nausea or other issues he may need outpatient workup with gastroenterology for this nausea His LFTs are normal and I do not think he has any current concerns for GI or hepatic pathology such as  cholecystitis Active Problems:   Hereditary and idiopathic peripheral neuropathy--MRI copy from recent workup given to wife by nursing   CAD s/p PCI LAD 06/2013 [facility] continue lisinopril 2.5, Plavix, aspirin-consider outpatient metoprolol initiation if thought necessary   Nausea & vomiting--potential PUD with pyloric ulcer given that he states to me that  his symptoms are 4 at his hunger and discomfort and then he vomits 1-2 hours subsequent .    Snores--However on Lyrica/Ambien--outpatient follow-up    65 minutes  Full CODE STATUS  Long and detailed discussion with wife who is an Barista, [facility]   If 7PM-7AM, please contact night-coverage [email or web address] Password  [email or web address] 04/09/2015, 12:56 PM

## 2015-04-10 DIAGNOSIS — D509 Iron deficiency anemia, unspecified: Secondary | ICD-10-CM

## 2015-04-10 DIAGNOSIS — I25118 Atherosclerotic heart disease of native coronary artery with other forms of angina pectoris: Secondary | ICD-10-CM | POA: Diagnosis not present

## 2015-04-10 DIAGNOSIS — R112 Nausea with vomiting, unspecified: Secondary | ICD-10-CM | POA: Diagnosis not present

## 2015-04-10 DIAGNOSIS — G609 Hereditary and idiopathic neuropathy, unspecified: Secondary | ICD-10-CM

## 2015-04-10 LAB — TYPE AND SCREEN
ABO/RH(D): A NEG
Antibody Screen: NEGATIVE
Unit division: 0
Unit division: 0

## 2015-04-10 LAB — CBC
HCT: 29.1 % — ABNORMAL LOW (ref 39.0–52.0)
Hemoglobin: 8.1 g/dL — ABNORMAL LOW (ref 13.0–17.0)
MCH: 18.2 pg — ABNORMAL LOW (ref 26.0–34.0)
MCHC: 27.8 g/dL — ABNORMAL LOW (ref 30.0–36.0)
MCV: 65.4 fL — ABNORMAL LOW (ref 78.0–100.0)
Platelets: 205 10*3/uL (ref 150–400)
RBC: 4.45 MIL/uL (ref 4.22–5.81)
RDW: 20.2 % — ABNORMAL HIGH (ref 11.5–15.5)
WBC: 4.8 10*3/uL (ref 4.0–10.5)

## 2015-04-10 LAB — PROTIME-INR
INR: 1.03 (ref 0.00–1.49)
PROTHROMBIN TIME: 13.6 s (ref 11.6–15.2)

## 2015-04-10 MED ORDER — SODIUM CHLORIDE 0.9 % IV SOLN
25.0000 mg | Freq: Once | INTRAVENOUS | Status: AC
  Administered 2015-04-10: 25 mg via INTRAVENOUS
  Filled 2015-04-10: qty 0.5

## 2015-04-10 MED ORDER — IRON POLYSACCH CMPLX-B12-FA 150-0.025-1 MG PO CAPS: 1.0000 | ORAL_CAPSULE | Freq: Every day | ORAL | Status: DC

## 2015-04-10 MED ORDER — SODIUM CHLORIDE 0.9 % IV SOLN
100.0000 mg | Freq: Once | INTRAVENOUS | Status: DC
  Administered 2015-04-10: 100 mg via INTRAVENOUS
  Filled 2015-04-10: qty 2

## 2015-04-10 MED ORDER — SODIUM CHLORIDE 0.9 % IV SOLN
750.0000 mg | Freq: Every day | INTRAVENOUS | Status: AC
  Administered 2015-04-10 (×2): 750 mg via INTRAVENOUS
  Filled 2015-04-10: qty 15

## 2015-04-10 MED ORDER — OMEPRAZOLE MAGNESIUM 20 MG PO TBEC: 20.0000 mg | DELAYED_RELEASE_TABLET | Freq: Every day | ORAL | Status: DC

## 2015-04-10 NOTE — Progress Notes (Signed)
Discharged to home with family office visits in place teaching done  

## 2015-04-10 NOTE — Progress Notes (Signed)
UR completed 

## 2015-04-10 NOTE — Discharge Summary (Signed)
Discharge Summary  Allen Glenn [id number] DOB: [birth year]  PCP: [name], MD  Admit date: 04/09/2015 Discharge date: 04/10/2015  Time spent: 35 minutes  Recommendations for Outpatient Follow-up:  1. New medication: Prilosec 20 mg by mouth daily 2. New medication: Niferex 150 mg by mouth daily 3. Patient has a scheduled appointment with his PCP Dr. Doyle Askew tomorrow, 4/18. Recommending for patient to get referral for gastroenterology and possibly hematology workup.   Discharge Diagnoses:  Active Hospital Problems   Diagnosis Date Noted  . Iron deficiency anemia 04/09/2015  . CAD s/p PCI LAD 06/2013 [facility] 04/09/2015  . Nausea & vomiting 04/09/2015  . Hereditary and idiopathic peripheral neuropathy 03/07/2015    Resolved Hospital Problems   Diagnosis Date Noted Date Resolved  No resolved problems to display.    Discharge Condition: Improved, being discharged home  Diet recommendation: Heart healthy  Filed Weights   04/09/15 0950 04/10/15 0536  Weight: 82.555 kg (182 lb) 81 kg (178 lb 9.2 oz)    History of present illness:  Patient is a 59 year old male with past history of CAD status post PCI 2 years ago who according to the last documented hemoglobin in December 2014 had normal hemoglobin with normal MCV. Patient started to complain of overall feeling of fatigue and decreased exercise tolerance and overall functional decline in the last 6 months. Wife felt like he was getting more pale in the past few months. Labs were done including basic metabolic panel and liver function tests which were unrevealing last month. Patient*having chest pain and followed up with his cardiologist at [facility]. At that time CBC was done and patient's hemoglobin came back at 6.9. This was called and the patient and he was sent over to the emergency room. There hemoglobin was confirmed to be low at 7.4. Is also noted patient's MCV was 63. The rest of his labs including white blood cell  count, platelet count, electrolytes and renal function were all normal as well as. Patient's blood pressure and heart rate were also stable, indicating that this was more of a chronic process and not necessarily bleed. Hemoccult negative.  Hospital Course:  Principal Problem:   Iron deficiency anemia: Patient transfused 2 units packed red blood cells. His hemoglobin only improved at 8.1. Iron studies noted a low iron of 24, ferritin of 4 and TIBC of 501 CONSISTENT with severe iron deficiency. The patient underwent a colonoscopy 8 years ago which was normal other than a benign polyp. Suspect that either patient had a slow chronic bleeding process over the last year which is depleted his iron deficiency or this is more of a production problem. Spoke with hematology and prior to discharge, patient received IV iron infusion and will be started on an iron supplement. He has a follow-up  scheduled with his PCP tomorrow which he should keep, and at that time can get a referral to a gastroenterologist here. Prior to any hematology workup, patient needs a repeat scope to confirm no bleeding, even subacute or chronic.  If this is unrevealing, hematology workup would be in order. I am less concerned about cancer given benign screening 8 years ago. Also suspect the patient's chest pain may be from critical anemia causing mild ischemia. With blood transfusion and reversal, he may not have any further cardiac issues, but this can be decided by his cardiologist. Given no signs of acute GI bleeding, first keep patient on patent his aspirin and Plavix. Nevertheless, we'll add PPI   Active Problems:  Hereditary and idiopathic peripheral neuropathy: Annual problem that is in the process of being worked up. In the end, it may be that anemia may cause patient to have some vasospasm issues and secondary sensation of neuropathy. Would be interesting to see if his neuropathic symptoms resolve as his hemoglobin trends upward over  time   CAD s/p PCI LAD 06/2013 [facility]: As above   Nausea & vomiting: Likely secondary to critical anemia   Procedures:  None  Consultations:  None  Discharge Exam: BP 122/58 mmHg  Pulse 70  Temp(Src) 98.6 F (37 C) (Oral)  Resp 20  Ht 6\' 2"  (1.88 m)  Wt 81 kg (178 lb 9.2 oz)  BMI 22.92 kg/m2  SpO2 98%  General: Alert and oriented 3, no acute distress Cardiovascular: Regular rate and rhythm, S1-S2 Respiratory: Clear to auscultation bilaterally  Discharge Instructions You were cared for by a hospitalist during your hospital stay. If you have any questions about your discharge medications or the care you received while you were in the hospital after you are discharged, you can call the unit and asked to speak with the hospitalist on call if the hospitalist that took care of you is not available. Once you are discharged, your primary care physician will handle any further medical issues. Please note that NO REFILLS for any discharge medications will be authorized once you are discharged, as it is imperative that you return to your primary care physician (or establish a relationship with a primary care physician if you do not have one) for your aftercare needs so that they can reassess your need for medications and monitor your lab values.  Discharge Instructions    Diet - low sodium heart healthy    Complete by:  As directed      Increase activity slowly    Complete by:  As directed             Medication List    STOP taking these medications        Gabapentin (Once-Daily) 600 MG Tabs     multivitamin 3-35-2 MG Tabs tablet     pregabalin 150 MG capsule  Commonly known as:  LYRICA     pregabalin 50 MG capsule  Commonly known as:  LYRICA     trolamine salicylate 10 % cream  Commonly known as:  ASPERCREME      TAKE these medications        ALPHA LIPOIC ACID PO  Take 1 capsule by mouth daily.     aspirin EC 81 MG tablet  Take 81 mg by mouth daily.      CALCIUM MAGNESIUM PO  Take 1 tablet by mouth daily.     clopidogrel 75 MG tablet  Commonly known as:  PLAVIX  Take 75 mg by mouth daily.     Iron Polysacch Cmplx-B12-FA 150-0.025-1 MG Caps  Take 1 tablet by mouth daily.     lisinopril 2.5 MG tablet  Commonly known as:  PRINIVIL,ZESTRIL  Take 2.5 mg by mouth daily.     multivitamin capsule  Take 1 capsule by mouth.     nitroGLYCERIN 0.4 MG SL tablet  Commonly known as:  NITROSTAT  1 TAB UNDER TONGUE EVERY 5 MINUTES FOR 3 DOSES AS NEEDED FOR CHEST PAIN. CALL 911 IF PAIN PERSISTS     omeprazole 20 MG tablet  Commonly known as:  PRILOSEC OTC  Take 1 tablet (20 mg total) by mouth daily.     tamsulosin 0.4 MG Caps  capsule  Commonly known as:  FLOMAX  Take 0.4 mg by mouth daily.     zolpidem 10 MG tablet  Commonly known as:  AMBIEN  Take 10 mg by mouth daily.       No Known Allergies    The results of significant diagnostics from this hospitalization (including imaging, microbiology, ancillary and laboratory) are listed below for reference.    Significant Diagnostic Studies: Mr Lumbar Spine Wo Contrast  04/07/2015   [facility]  NEUROIMAGING REPORT   STUDY DATE: 04/06/15 PATIENT NAME: Allen Glenn DOB: Jan 29, 1956 MRN: [id number]  ORDERING CLINICIAN: Sarina Ill, MD  CLINICAL HISTORY: 59 year old male with back pain and numbness.  EXAM: MRI lumbar spine (without)  TECHNIQUE: MRI of the lumbar spine was obtained utilizing 4 mm sagittal  slices from Y77-41 down to the lower sacrum with T1, T2 and inversion  recovery views. In addition 4 mm axial slices from O8-7 down to L5-S1  level were included with T1 and T2 weighted views. CONTRAST: no IMAGING SITE: [facility] [address] (1.5 Tesla MRI)   FINDINGS:  On sagittal views the vertebral bodies have normal height and alignment.  Mild disc bulging from L1-2 to L5-S1. Small annular tears at L4-5 and  L5-S1. The conus medullaris terminates at the level of T12-L1. On  axial  views, there is suggestion of diastematomyelia (series 6 image 1) but this  is not definite. Small hemangioma at L3.   On axial views:  L1-2: no spinal stenosis or foraminal narrowing  L2-3: no spinal stenosis or foraminal narrowing  L3-4: no spinal stenosis or foraminal narrowing  L4-5: disc bulging and facet hypertrophy with mild biforaminal stenosis  L5-S1: disc bulging and facet hypertrophy with moderate right and mild  left foraminal stenosis   Limited views of the aorta, kidneys, iliopsoas muscles and sacroiliac  joints are unremarkable.    04/07/2015   Abnormal MRI lumbar spine (without) demonstrating: 1. At L5-S1: disc bulging and facet hypertrophy with moderate right and  mild left foraminal stenosis  2. At L4-5: disc bulging and facet hypertrophy with mild biforaminal  stenosis  3. 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 No  evidence of tethered cord, fatty filum terminale or other spinal  dysraphism.    INTERPRETING PHYSICIAN:  Penni Bombard, MD Certified in Neurology, Neurophysiology and Neuroimaging  [facility] [address] [place], [place] [address] [phone]    Microbiology: No results found for this or any previous visit (from the past 240 hour(s)).   Labs: Basic Metabolic Panel:  Recent Labs Lab 04/09/15 0957  NA 136  K 4.1  CL 103  CO2 24  GLUCOSE 102*  BUN 14  CREATININE 0.76  CALCIUM 8.5   Liver Function Tests:  Recent Labs Lab 04/09/15 0957  AST 25  ALT 15  ALKPHOS 63  BILITOT 0.5  PROT 6.7  ALBUMIN 3.9   No results for input(s): LIPASE, AMYLASE in the last 168 hours. No results for input(s): AMMONIA in the last 168 hours. CBC:  Recent Labs Lab 04/09/15 0957 04/10/15 0530  WBC 4.0 4.8  HGB 7.4* 8.1*  HCT 28.6* 29.1*  MCV 62.6* 65.4*  PLT 250 205   Cardiac Enzymes: No results for input(s): CKTOTAL,  CKMB, CKMBINDEX, TROPONINI in the last 168 hours. BNP: BNP (last 3 results) No results for input(s): BNP in the last 8760  hours.  ProBNP (last 3 results) No results for input(s): PROBNP in the last 8760 hours.  CBG: No results for input(s): GLUCAP in the last 168 hours.     Signed:  Annita Brod  [facility] 04/10/2015, 2:21 PM

## 2015-04-13 ENCOUNTER — Telehealth: Payer: Self-pay | Admitting: Neurology

## 2015-04-13 ENCOUNTER — Other Ambulatory Visit: Payer: Self-pay | Admitting: Neurology

## 2015-04-13 MED ORDER — PREGABALIN 50 MG PO CAPS: 50.0000 mg | ORAL_CAPSULE | Freq: Three times a day (TID) | ORAL | Status: DC

## 2015-04-13 NOTE — Telephone Encounter (Signed)
Discussed MRI l-spine. With patient   IMPRESSION:  Abnormal MRI lumbar spine (without) demonstrating: 1. At L5-S1: disc bulging and facet hypertrophy with moderate right and mild left foraminal stenosis  2. At L4-5: disc bulging and facet hypertrophy with mild biforaminal stenosis  3. 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 No evidence of tethered cord, fatty filum terminale or other spinal dysraphism.  The paresthesias in his left foot likely neuropathy. He is still having low back pain but without radiculopathy. Will increase lyrica.

## 2015-04-13 NOTE — Telephone Encounter (Signed)
Dr. Leonie Man - This patient had a carotid Doppler completed on the 13th. Do we have results yet? Thank you.

## 2015-04-14 NOTE — Telephone Encounter (Signed)
Faxed Rx for Lyrica 50mg  to pt pharmacy at 8:20am on 04/14/15. Received fax confirmation.

## 2015-04-21 ENCOUNTER — Other Ambulatory Visit: Payer: Managed Care, Other (non HMO)

## 2015-04-26 ENCOUNTER — Telehealth: Payer: Self-pay | Admitting: *Deleted

## 2015-04-26 NOTE — Telephone Encounter (Signed)
Spoke with patient about normal carotid duplex results/no plaque. Pt verbalized understanding. Pt also wanted to let Dr. Jaynee Eagles know he was recently diagnosed with colon cancer. He said he is scheduled for surgery. Dr. Jaynee Eagles is aware. I told patient if he needed anything to call and we would be happy to help. Pt verbalized understanding.

## 2015-05-03 DIAGNOSIS — I10 Essential (primary) hypertension: Secondary | ICD-10-CM | POA: Diagnosis present

## 2015-09-28 HISTORY — PX: LIVER LOBECTOMY: SHX1976

## 2015-11-10 ENCOUNTER — Encounter: Payer: Self-pay | Admitting: *Deleted

## 2015-11-10 ENCOUNTER — Other Ambulatory Visit: Payer: Self-pay | Admitting: Neurology

## 2015-11-10 NOTE — Telephone Encounter (Signed)
Dr Jaynee Eagles is out of the office.  Request forwarded to [facility] for review.

## 2015-11-10 NOTE — Progress Notes (Signed)
Lyrica rx. up front [facility]

## 2015-11-18 ENCOUNTER — Other Ambulatory Visit: Payer: Self-pay | Admitting: Neurology

## 2015-11-23 ENCOUNTER — Other Ambulatory Visit: Payer: Self-pay

## 2015-12-29 ENCOUNTER — Telehealth: Payer: Self-pay | Admitting: *Deleted

## 2015-12-29 NOTE — Telephone Encounter (Signed)
Rx for Lyrica printed 11/10/15 - never picked up by patient - script destroyed.

## 2016-04-13 MED FILL — TAMSULOSIN HCL 0.4 MG CAP: 0.4 | 30 days supply | Qty: 30 | Fill #0

## 2016-04-13 MED FILL — LYRICA 75 MG CAPSULE: 75 | 30 days supply | Qty: 60 | Fill #0

## 2016-04-13 MED FILL — LISINOPRIL 2.5 MG TABLET: 2.5 | 90 days supply | Qty: 90 | Fill #0

## 2016-04-13 MED FILL — CLOPIDOGREL 75 MG TABLET: 75 | 90 days supply | Qty: 90 | Fill #0

## 2016-04-17 ENCOUNTER — Other Ambulatory Visit (HOSPITAL_COMMUNITY): Payer: Self-pay | Admitting: Physician Assistant

## 2016-04-17 DIAGNOSIS — C189 Malignant neoplasm of colon, unspecified: Secondary | ICD-10-CM

## 2016-04-17 DIAGNOSIS — C787 Secondary malignant neoplasm of liver and intrahepatic bile duct: Principal | ICD-10-CM

## 2016-04-23 DIAGNOSIS — J309 Allergic rhinitis, unspecified: Secondary | ICD-10-CM | POA: Diagnosis not present

## 2016-04-30 DIAGNOSIS — N35013 Post-traumatic anterior urethral stricture: Secondary | ICD-10-CM | POA: Diagnosis not present

## 2016-04-30 DIAGNOSIS — N359 Urethral stricture, unspecified: Secondary | ICD-10-CM | POA: Diagnosis not present

## 2016-04-30 DIAGNOSIS — R39198 Other difficulties with micturition: Secondary | ICD-10-CM | POA: Diagnosis not present

## 2016-05-07 ENCOUNTER — Ambulatory Visit (HOSPITAL_COMMUNITY): Payer: Managed Care, Other (non HMO)

## 2016-05-07 ENCOUNTER — Ambulatory Visit (HOSPITAL_COMMUNITY)
Admission: RE | Admit: 2016-05-07 | Discharge: 2016-05-07 | Disposition: A | Discharge status: Home / Self Care | Payer: 59 | Source: Ambulatory Visit | Attending: Physician Assistant | Admitting: Physician Assistant

## 2016-05-07 ENCOUNTER — Encounter (HOSPITAL_COMMUNITY): Payer: Self-pay

## 2016-05-07 DIAGNOSIS — R160 Hepatomegaly, not elsewhere classified: Secondary | ICD-10-CM | POA: Diagnosis not present

## 2016-05-07 DIAGNOSIS — N289 Disorder of kidney and ureter, unspecified: Secondary | ICD-10-CM | POA: Insufficient documentation

## 2016-05-07 DIAGNOSIS — C787 Secondary malignant neoplasm of liver and intrahepatic bile duct: Secondary | ICD-10-CM | POA: Insufficient documentation

## 2016-05-07 DIAGNOSIS — K8020 Calculus of gallbladder without cholecystitis without obstruction: Secondary | ICD-10-CM | POA: Diagnosis not present

## 2016-05-07 DIAGNOSIS — I2510 Atherosclerotic heart disease of native coronary artery without angina pectoris: Secondary | ICD-10-CM | POA: Diagnosis not present

## 2016-05-07 DIAGNOSIS — C189 Malignant neoplasm of colon, unspecified: Secondary | ICD-10-CM | POA: Diagnosis not present

## 2016-05-07 DIAGNOSIS — K7689 Other specified diseases of liver: Secondary | ICD-10-CM | POA: Diagnosis not present

## 2016-05-07 DIAGNOSIS — R918 Other nonspecific abnormal finding of lung field: Secondary | ICD-10-CM | POA: Diagnosis not present

## 2016-05-07 MED ORDER — IOPAMIDOL (ISOVUE-300) INJECTION 61%
INTRAVENOUS | Status: AC
  Administered 2016-05-07: 30 mL
  Filled 2016-05-07: qty 75

## 2016-05-07 MED ORDER — IOPAMIDOL (ISOVUE-300) INJECTION 61%
INTRAVENOUS | Status: AC
  Administered 2016-05-07: 75 mL
  Filled 2016-05-07: qty 30

## 2016-05-14 DIAGNOSIS — C787 Secondary malignant neoplasm of liver and intrahepatic bile duct: Secondary | ICD-10-CM | POA: Diagnosis not present

## 2016-05-14 DIAGNOSIS — C189 Malignant neoplasm of colon, unspecified: Secondary | ICD-10-CM | POA: Diagnosis not present

## 2016-05-23 ENCOUNTER — Encounter: Payer: Self-pay | Admitting: Interventional Cardiology

## 2016-05-23 DIAGNOSIS — Z23 Encounter for immunization: Secondary | ICD-10-CM | POA: Diagnosis not present

## 2016-05-23 DIAGNOSIS — D649 Anemia, unspecified: Secondary | ICD-10-CM | POA: Diagnosis not present

## 2016-05-23 DIAGNOSIS — C787 Secondary malignant neoplasm of liver and intrahepatic bile duct: Secondary | ICD-10-CM | POA: Diagnosis not present

## 2016-05-23 DIAGNOSIS — C189 Malignant neoplasm of colon, unspecified: Secondary | ICD-10-CM | POA: Diagnosis not present

## 2016-05-23 DIAGNOSIS — E782 Mixed hyperlipidemia: Secondary | ICD-10-CM | POA: Diagnosis not present

## 2016-05-23 DIAGNOSIS — N400 Enlarged prostate without lower urinary tract symptoms: Secondary | ICD-10-CM | POA: Diagnosis not present

## 2016-05-23 DIAGNOSIS — I2510 Atherosclerotic heart disease of native coronary artery without angina pectoris: Secondary | ICD-10-CM | POA: Diagnosis not present

## 2016-05-23 DIAGNOSIS — M792 Neuralgia and neuritis, unspecified: Secondary | ICD-10-CM | POA: Diagnosis not present

## 2016-05-23 DIAGNOSIS — I10 Essential (primary) hypertension: Secondary | ICD-10-CM | POA: Diagnosis not present

## 2016-06-11 ENCOUNTER — Other Ambulatory Visit (HOSPITAL_COMMUNITY): Payer: Self-pay | Admitting: Hematology and Oncology

## 2016-06-11 DIAGNOSIS — C189 Malignant neoplasm of colon, unspecified: Secondary | ICD-10-CM

## 2016-06-11 DIAGNOSIS — C787 Secondary malignant neoplasm of liver and intrahepatic bile duct: Principal | ICD-10-CM

## 2016-06-12 NOTE — Progress Notes (Signed)
Cardiology Office Note    Date:  06/13/2016   ID:  Allen Glenn, DOB [birth year], MRN [id number]  PCP:  [name], MD  Cardiologist: Sinclair Grooms, MD   Chief Complaint  Patient presents with  . Coronary Artery Disease    History of Present Illness:  Allen Glenn is a 60 y.o. 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  The patient suffered acute anterior non-ST elevation myocardial infarction 06/27/2013. He was treated at [facility] by Dr. Clovia Cuff where he received a 3.0 x 16 Promus Premier DES reducing a 98% stenosis to 0% with TIMI grade 3 flow. 60% nonobstructive LAD lesion was noted just distal to the second diagonal. LVEF was 35% acutely but according to the wife improved to greater than 50% prior to discharge from the hospital by echo.  The patient has been asymptomatic since the acute intervention. He subsequently developed severe anemia which was worked up and found to be related to colon cancer. He underwent resection of the cancer but was also noted to have a solitary liver metastasis. This was treated with resection and chemotherapy. Last treatments were in November 2016. No recurrence of anemia. His physical activities are increasing. Appetite is normal. He has stopped taking statin therapy and is not been following a prudent diet.    Past Medical History  Diagnosis Date  . Heart disease   . Coronary artery disease   . Acute MI ([place]) 2014    acute ST elevation MI  . Colon cancer ([place]) 06/13/2016    Status post resection of colon mass is well as he panic metastasis.     Past Surgical History  Procedure Laterality Date  . Coronary stent placement  06-27-2013    Current Medications: Outpatient Prescriptions Prior to Visit  Medication Sig Dispense Refill  . aspirin  EC 81 MG tablet Take 81 mg by mouth daily.    . Calcium-Magnesium-Vitamin D (CALCIUM MAGNESIUM PO) Take 1 tablet by mouth daily.    . clopidogrel (PLAVIX) 75 MG tablet Take 75 mg by mouth daily.    . Iron Polysacch Cmplx-B12-FA 150-0.025-1 MG CAPS Take 1 tablet by mouth daily. 30 each 3  . lisinopril (PRINIVIL,ZESTRIL) 2.5 MG tablet Take 2.5 mg by mouth daily.    Marland Kitchen LYRICA 50 MG capsule TAKE ONE CAPSULE BY MOUTH 3 TIMES A DAY 90 capsule 1  . Multiple Vitamin (MULTIVITAMIN) capsule Take 1 capsule by mouth.    . nitroGLYCERIN (NITROSTAT) 0.4 MG SL tablet 1 TAB UNDER TONGUE EVERY 5 MINUTES FOR 3 DOSES AS NEEDED FOR CHEST PAIN. CALL 911 IF PAIN PERSISTS    . tamsulosin (FLOMAX) 0.4 MG CAPS capsule Take 0.4 mg by mouth daily.    . ALPHA LIPOIC ACID PO Take 1 capsule by mouth daily. Reported on 06/13/2016    . omeprazole (PRILOSEC OTC) 20 MG tablet Take 1 tablet (20 mg total) by mouth daily. (Patient not taking: Reported on 06/13/2016) 30 tablet 1  . zolpidem (AMBIEN) 10 MG tablet Take 10 mg by mouth daily. Reported on 06/13/2016     No facility-administered medications prior to visit.     Allergies:   Review of patient's allergies indicates no known allergies.   Social History   Social History  . Marital Status: Married    Spouse Name: [name]  .  Number of Children: 2  . Years of Education: Bachelor   Occupational History  . Minister    Social History Main Topics  . Smoking status: Never Smoker   . Smokeless tobacco: Never Used  . Alcohol Use: No  . Drug Use: No  . Sexual Activity: Not Asked   Other Topics Concern  . None   Social History Narrative   Live at home with wife.   Caffeine: very little soda, occasional tea. 12oz-16oz/per     Family History:  The patient's family history includes Heart disease in his father and mother; Hypertension in his father and mother. There is no history of Neuropathy.   ROS:   Please see the history of present illness.    Muscle aches and statin  therapy  All other systems reviewed and are negative.   PHYSICAL EXAM:   VS:  BP 116/68 mmHg  Pulse 75  Ht 6\' 3"  (1.905 m)  Wt 202 lb 12.8 oz (91.989 kg)  BMI 25.35 kg/m2   GEN: Well nourished, well developed, in no acute distress HEENT: normal Neck: no JVD, carotid bruits, or masses Cardiac: RRR; no murmurs, rubs, or gallops,no edema  Respiratory:  clear to auscultation bilaterally, normal work of breathing GI: soft, nontender, nondistended, + BS MS: no deformity or atrophy Skin: warm and dry, no rash Neuro:  Alert and Oriented x 3, Strength and sensation are intact Psych: euthymic mood, full affect  Wt Readings from Last 3 Encounters:  06/13/16 202 lb 12.8 oz (91.989 kg)  04/10/15 178 lb 9.2 oz (81 kg)  04/05/15 183 lb (83.008 kg)      Studies/Labs Reviewed:   EKG:  EKG  Normal sinus rhythm with overall normal appearance.  Recent Labs: No results found for requested labs within last 365 days.   Lipid Panel No results found for: CHOL, TRIG, HDL, CHOLHDL, VLDL, LDLCALC, LDLDIRECT  Additional studies/ records that were reviewed today include:   The most recent lipid panel performed on 05/23/16 [facility] a direct LDL cholesterol of 126 total cholesterol of 246, triglyceride of 526, an HDL of 41. Creatinine is normal at 0.77    ASSESSMENT:    1. Coronary artery disease involving native coronary artery with other forms of angina pectoris ([place])   2. Hyperlipidemia   3. Malignant neoplasm of ascending colon ([place])   4. Chronic diastolic heart failure (HCC)      PLAN:  In order of problems listed above:  1. Aerobic activity, aspirin 81 mg per day, and statin therapy needs to be reinstituted. He will also try diet alone before committing to statin therapy. His heart is been stressed by hemoglobin less than 5 and several gastrointestinal operations within the past 8 months without any cardiovascular complications. Overall left fill his heart is been doing quite well.  Plavix is been discontinued for quite some time. 2. Liver and lipid panel will be done fasting in 6 weeks. If LDL is still greater than 100*low-dose statin therapy and follow-up. 4.   No evidence of volume overload. No recent LVEF assessment.LVEF prior to discharge from the hospital was 55% by transthoracic echo haven't improved from 35% acutely.    Medication Adjustments/Labs and Tests Ordered: Current medicines are reviewed at length with the patient today.  Concerns regarding medicines are outlined above.  Medication changes, Labs and Tests ordered today are listed in the Patient Instructions below. Patient Instructions  Medication Instructions:   NO CHANGE  Labwork:  Your physician recommends that you  return for lab work in: 6 WEEKS= DO NOT EAT PRIOR TO LAB WORK  Follow-Up:  Your physician wants you to follow-up in: [place] will receive a reminder letter in the mail two months in advance. If you don't receive a letter, please call our office to schedule the follow-up appointment.          Signed, Sinclair Grooms, MD  06/13/2016 4:47 PM    [facility] [place], [place], [place]  [address] Phone: [phone]; Fax: [phone]

## 2016-06-13 ENCOUNTER — Encounter: Payer: Self-pay | Admitting: Interventional Cardiology

## 2016-06-13 ENCOUNTER — Ambulatory Visit (INDEPENDENT_AMBULATORY_CARE_PROVIDER_SITE_OTHER): Payer: 59 | Admitting: Interventional Cardiology

## 2016-06-13 VITALS — BP 116/68 | HR 75 | Ht 75.0 in | Wt 202.8 lb

## 2016-06-13 DIAGNOSIS — I5032 Chronic diastolic (congestive) heart failure: Secondary | ICD-10-CM | POA: Insufficient documentation

## 2016-06-13 DIAGNOSIS — E782 Mixed hyperlipidemia: Secondary | ICD-10-CM | POA: Insufficient documentation

## 2016-06-13 DIAGNOSIS — C189 Malignant neoplasm of colon, unspecified: Secondary | ICD-10-CM | POA: Insufficient documentation

## 2016-06-13 DIAGNOSIS — I25118 Atherosclerotic heart disease of native coronary artery with other forms of angina pectoris: Secondary | ICD-10-CM | POA: Diagnosis not present

## 2016-06-13 DIAGNOSIS — C182 Malignant neoplasm of ascending colon: Secondary | ICD-10-CM | POA: Diagnosis not present

## 2016-06-13 DIAGNOSIS — E785 Hyperlipidemia, unspecified: Secondary | ICD-10-CM | POA: Diagnosis not present

## 2016-06-13 NOTE — Patient Instructions (Signed)
Medication Instructions:   NO CHANGE  Labwork:  Your physician recommends that you return for lab work in: 6 WEEKS= DO NOT EAT PRIOR TO LAB WORK  Follow-Up:  Your physician wants you to follow-up in: [place] will receive a reminder letter in the mail two months in advance. If you don't receive a letter, please call our office to schedule the follow-up appointment.

## 2016-06-18 ENCOUNTER — Encounter (HOSPITAL_COMMUNITY): Payer: Self-pay | Admitting: Emergency Medicine

## 2016-06-18 ENCOUNTER — Ambulatory Visit (HOSPITAL_COMMUNITY)
Admission: EM | Admit: 2016-06-18 | Discharge: 2016-06-18 | Disposition: A | Discharge status: Home / Self Care | Payer: 59 | Attending: Family Medicine | Admitting: Family Medicine

## 2016-06-18 DIAGNOSIS — T148 Other injury of unspecified body region: Secondary | ICD-10-CM

## 2016-06-18 DIAGNOSIS — W57XXXA Bitten or stung by nonvenomous insect and other nonvenomous arthropods, initial encounter: Secondary | ICD-10-CM

## 2016-06-18 MED ORDER — FLUTICASONE PROPIONATE 0.05 % EX CREA: TOPICAL_CREAM | Freq: Two times a day (BID) | CUTANEOUS | Status: DC

## 2016-06-18 NOTE — ED Provider Notes (Signed)
CSN: [id number]     Arrival date & time 06/18/16  1534 History   None    Chief Complaint  Patient presents with  . Insect Bite  . Rash   (Consider location/radiation/quality/duration/timing/severity/associated sxs/prior Treatment) Patient is a 60 y.o. male presenting with rash. The history is provided by the patient and the spouse.  Rash Location:  Head/neck Head/neck rash location:  L neck Quality: blistering, dryness and itchiness   Severity:  Mild Onset quality:  Sudden Duration:  1 day Progression:  Spreading Chronicity:  New Context: insect bite/sting   Relieved by:  None tried Worsened by:  Nothing tried Ineffective treatments:  None tried Associated symptoms: no fever, no throat swelling and no tongue swelling     Past Medical History  Diagnosis Date  . Heart disease   . Coronary artery disease   . Acute MI ([place]) 2014    acute ST elevation MI  . Colon cancer ([place]) 06/13/2016    Status post resection of colon mass is well as he panic metastasis.    Past Surgical History  Procedure Laterality Date  . Coronary stent placement  06-27-2013   Family History  Problem Relation Age of Onset  . Neuropathy Neg Hx   . Hypertension Mother   . Heart disease Mother   . Hypertension Father   . Heart disease Father    Social History  Substance Use Topics  . Smoking status: Never Smoker   . Smokeless tobacco: Never Used  . Alcohol Use: No    Review of Systems  Constitutional: Negative.  Negative for fever.  Skin: Positive for rash.  All other systems reviewed and are negative.   Allergies  Review of patient's allergies indicates no known allergies.  Home Medications   Prior to Admission medications   Medication Sig Start Date End Date Taking? Authorizing Provider  aspirin EC 81 MG tablet Take 81 mg by mouth daily. 06/30/13  Yes Historical Provider, MD  clopidogrel (PLAVIX) 75 MG tablet Take 75 mg by mouth daily. 02/16/15  Yes Historical Provider, MD  lisinopril  (PRINIVIL,ZESTRIL) 2.5 MG tablet Take 2.5 mg by mouth daily. 02/16/15  Yes Historical Provider, MD  LYRICA 50 MG capsule TAKE ONE CAPSULE BY MOUTH 3 TIMES A DAY 11/10/15  Yes Marcial Pacas, MD  tamsulosin (FLOMAX) 0.4 MG CAPS capsule Take 0.4 mg by mouth daily. 02/24/15  Yes Historical Provider, MD  Calcium-Magnesium-Vitamin D (CALCIUM MAGNESIUM PO) Take 1 tablet by mouth daily.    Historical Provider, MD  fluticasone (CUTIVATE) 0.05 % cream Apply topically 2 (two) times daily. 06/18/16   Billy Fischer, MD  Iron Polysacch Cmplx-B12-FA 150-0.025-1 MG CAPS Take 1 tablet by mouth daily. 04/10/15   Annita Brod, MD  Multiple Vitamin (MULTIVITAMIN) capsule Take 1 capsule by mouth.    Historical Provider, MD  nitroGLYCERIN (NITROSTAT) 0.4 MG SL tablet 1 TAB UNDER TONGUE EVERY 5 MINUTES FOR 3 DOSES AS NEEDED FOR CHEST PAIN. CALL 911 IF PAIN PERSISTS 11/24/14   Historical Provider, MD   Meds Ordered and Administered this Visit  Medications - No data to display  BP 137/75 mmHg  Pulse 70  Temp(Src) 98 F (36.7 C) (Oral)  Resp 16  SpO2 97% No data found.   Physical Exam  Constitutional: He is oriented to person, place, and time. He appears well-developed and well-nourished. No distress.  Pulmonary/Chest: Effort normal and breath sounds normal.  Abdominal: Soft. Bowel sounds are normal.  Musculoskeletal: Normal range of motion.  Neurological:  He is alert and oriented to person, place, and time.  Skin: Skin is warm and dry. Rash noted. There is erythema.  Papular patchy dermatitis to left side of neck, no infection  Nursing note and vitals reviewed.   ED Course  Procedures (including critical care time)  Labs Review Labs Reviewed - No data to display  Imaging Review No results found.   Visual Acuity Review  Right Eye Distance:   Left Eye Distance:   Bilateral Distance:    Right Eye Near:   Left Eye Near:    Bilateral Near:         MDM   1. Multiple insect bites         Billy Fischer, MD 06/18/16 [phone]

## 2016-06-18 NOTE — ED Notes (Signed)
The patient presented to the [facility] with a complaint of a possible bug bite and associated rash that occurred on the back of his neck yesterday.

## 2016-07-09 ENCOUNTER — Ambulatory Visit (HOSPITAL_COMMUNITY)
Admission: RE | Admit: 2016-07-09 | Discharge: 2016-07-09 | Disposition: A | Discharge status: Home / Self Care | Payer: 59 | Source: Ambulatory Visit | Attending: Hematology and Oncology | Admitting: Hematology and Oncology

## 2016-07-09 DIAGNOSIS — N281 Cyst of kidney, acquired: Secondary | ICD-10-CM | POA: Insufficient documentation

## 2016-07-09 DIAGNOSIS — R160 Hepatomegaly, not elsewhere classified: Secondary | ICD-10-CM | POA: Insufficient documentation

## 2016-07-09 DIAGNOSIS — R932 Abnormal findings on diagnostic imaging of liver and biliary tract: Secondary | ICD-10-CM | POA: Insufficient documentation

## 2016-07-09 DIAGNOSIS — C787 Secondary malignant neoplasm of liver and intrahepatic bile duct: Secondary | ICD-10-CM | POA: Insufficient documentation

## 2016-07-09 DIAGNOSIS — C189 Malignant neoplasm of colon, unspecified: Secondary | ICD-10-CM | POA: Insufficient documentation

## 2016-07-09 LAB — POCT I-STAT CREATININE: Creatinine, Ser: 0.9 mg/dL (ref 0.61–1.24)

## 2016-07-09 MED ORDER — GADOBENATE DIMEGLUMINE 529 MG/ML IV SOLN
20.0000 mL | Freq: Once | INTRAVENOUS | Status: AC
  Administered 2016-07-09: 20 mL via INTRAVENOUS

## 2016-07-12 DIAGNOSIS — C787 Secondary malignant neoplasm of liver and intrahepatic bile duct: Secondary | ICD-10-CM | POA: Diagnosis not present

## 2016-07-12 DIAGNOSIS — C189 Malignant neoplasm of colon, unspecified: Secondary | ICD-10-CM | POA: Diagnosis not present

## 2016-07-25 ENCOUNTER — Encounter (INDEPENDENT_AMBULATORY_CARE_PROVIDER_SITE_OTHER): Payer: Self-pay

## 2016-07-25 ENCOUNTER — Other Ambulatory Visit (INDEPENDENT_AMBULATORY_CARE_PROVIDER_SITE_OTHER): Payer: 59

## 2016-07-25 ENCOUNTER — Telehealth: Payer: Self-pay | Admitting: Interventional Cardiology

## 2016-07-25 DIAGNOSIS — I25118 Atherosclerotic heart disease of native coronary artery with other forms of angina pectoris: Secondary | ICD-10-CM | POA: Diagnosis not present

## 2016-07-25 LAB — LIPID PANEL
Cholesterol: 203 mg/dL — ABNORMAL HIGH (ref 125–200)
HDL: 44 mg/dL (ref >=40)
LDL CALC: 123 mg/dL (ref <130)
Total CHOL/HDL Ratio: 4.6 Ratio (ref <=5.0)
Triglycerides: 178 mg/dL — ABNORMAL HIGH (ref <150)
VLDL: 36 mg/dL — ABNORMAL HIGH (ref <30)

## 2016-07-25 NOTE — Telephone Encounter (Signed)
New message         The pt wants to speak with a nurse about the lab results.

## 2016-07-26 ENCOUNTER — Telehealth: Payer: Self-pay | Admitting: Interventional Cardiology

## 2016-07-26 DIAGNOSIS — E785 Hyperlipidemia, unspecified: Secondary | ICD-10-CM

## 2016-07-26 MED ORDER — ATORVASTATIN CALCIUM 10 MG PO TABS: 10.0000 mg | ORAL_TABLET | Freq: Every day | ORAL | 6 refills | Status: DC

## 2016-07-26 NOTE — Telephone Encounter (Signed)
We forward back to Dr. Tamala Julian. Dr. Tamala Julian can you please put findings in this phone note  -- we cannot see your comments for lipid panel.  Please route comments to triage when completed. Thanks

## 2016-07-26 NOTE — Telephone Encounter (Signed)
Please see results from yesterday's tests. I've commented on them

## 2016-07-26 NOTE — Telephone Encounter (Signed)
Spoke with pt and reviewed results and recommendations.  Pt in agreement with plan.  Verified pharmacy and sent prescription.  Labs scheduled for 9/19.

## 2016-07-26 NOTE — Telephone Encounter (Signed)
New message     The pt is calling to get his lab results from yesterday

## 2016-07-26 NOTE — Telephone Encounter (Signed)
Advised patient that results not reviewed by physician. Pt given preliminary results. He understands someone will call him back with Dr. Thompson Caul review/advisement. Will forward to Dr. Tamala Julian to review lab result.

## 2016-07-26 NOTE — Telephone Encounter (Signed)
Start atorvastatin 10 mg per day. Repeat liver and lipid panel in 6-8 weeks.

## 2016-07-27 MED FILL — ATORVASTATIN 10 MG TABLET: 10 | 30 days supply | Qty: 30 | Fill #0

## 2016-07-27 NOTE — Telephone Encounter (Signed)
Pt has already been made aware of his lab results and Dr.Smith's recommendation. Rx has been sent to his pharmacy and 6 wk fasting lab appt has been scheduled.  Nothing further needed at this time

## 2016-07-27 NOTE — Telephone Encounter (Signed)
-----   Message from [name], MD sent at 07/26/2016  1:59 PM EDT ----- Regarding: Lipids I accidentally closed his lab without comment. He has elevated LDL and should be on statin, Atorvastatin 10 mg daily. Repeat a liver and lipid panel in 6 weeks.

## 2016-08-13 DIAGNOSIS — I10 Essential (primary) hypertension: Secondary | ICD-10-CM | POA: Diagnosis not present

## 2016-08-13 DIAGNOSIS — N281 Cyst of kidney, acquired: Secondary | ICD-10-CM | POA: Diagnosis not present

## 2016-08-13 DIAGNOSIS — Z7982 Long term (current) use of aspirin: Secondary | ICD-10-CM | POA: Diagnosis not present

## 2016-08-13 DIAGNOSIS — I252 Old myocardial infarction: Secondary | ICD-10-CM | POA: Diagnosis not present

## 2016-08-13 DIAGNOSIS — G629 Polyneuropathy, unspecified: Secondary | ICD-10-CM | POA: Diagnosis not present

## 2016-08-13 DIAGNOSIS — C189 Malignant neoplasm of colon, unspecified: Secondary | ICD-10-CM | POA: Diagnosis not present

## 2016-08-13 DIAGNOSIS — I2510 Atherosclerotic heart disease of native coronary artery without angina pectoris: Secondary | ICD-10-CM | POA: Diagnosis not present

## 2016-08-13 DIAGNOSIS — C787 Secondary malignant neoplasm of liver and intrahepatic bile duct: Secondary | ICD-10-CM | POA: Diagnosis not present

## 2016-08-13 DIAGNOSIS — R21 Rash and other nonspecific skin eruption: Secondary | ICD-10-CM | POA: Diagnosis not present

## 2016-08-13 DIAGNOSIS — G609 Hereditary and idiopathic neuropathy, unspecified: Secondary | ICD-10-CM | POA: Diagnosis not present

## 2016-08-14 MED FILL — CLOPIDOGREL 75 MG TABLET: 75 | 90 days supply | Qty: 90 | Fill #0

## 2016-08-20 MED FILL — TAMSULOSIN HCL 0.4 MG CAP: 0.4 | 90 days supply | Qty: 90 | Fill #0

## 2016-08-28 MED FILL — ATORVASTATIN 10 MG TABLET: 10 | 90 days supply | Qty: 90 | Fill #1

## 2016-09-11 ENCOUNTER — Other Ambulatory Visit: Payer: 59

## 2016-09-12 MED FILL — GAVILYTE-G SOLUTION: 236 | 1 days supply | Qty: 4000 | Fill #0

## 2016-09-17 MED FILL — LISINOPRIL 2.5 MG TABLET: 2.5 | 90 days supply | Qty: 90 | Fill #0

## 2016-09-19 DIAGNOSIS — I2510 Atherosclerotic heart disease of native coronary artery without angina pectoris: Secondary | ICD-10-CM | POA: Diagnosis not present

## 2016-09-19 DIAGNOSIS — I10 Essential (primary) hypertension: Secondary | ICD-10-CM | POA: Diagnosis not present

## 2016-09-19 DIAGNOSIS — D500 Iron deficiency anemia secondary to blood loss (chronic): Secondary | ICD-10-CM | POA: Diagnosis not present

## 2016-09-19 DIAGNOSIS — Z1211 Encounter for screening for malignant neoplasm of colon: Secondary | ICD-10-CM | POA: Diagnosis not present

## 2016-09-19 DIAGNOSIS — I252 Old myocardial infarction: Secondary | ICD-10-CM | POA: Diagnosis not present

## 2016-09-19 DIAGNOSIS — Z85038 Personal history of other malignant neoplasm of large intestine: Secondary | ICD-10-CM | POA: Diagnosis not present

## 2016-09-19 DIAGNOSIS — E785 Hyperlipidemia, unspecified: Secondary | ICD-10-CM | POA: Diagnosis not present

## 2016-09-19 DIAGNOSIS — C189 Malignant neoplasm of colon, unspecified: Secondary | ICD-10-CM | POA: Diagnosis not present

## 2016-09-19 DIAGNOSIS — N400 Enlarged prostate without lower urinary tract symptoms: Secondary | ICD-10-CM | POA: Diagnosis not present

## 2016-09-19 DIAGNOSIS — Z7982 Long term (current) use of aspirin: Secondary | ICD-10-CM | POA: Diagnosis not present

## 2016-10-05 DIAGNOSIS — Z23 Encounter for immunization: Secondary | ICD-10-CM | POA: Diagnosis not present

## 2016-10-09 ENCOUNTER — Other Ambulatory Visit: Payer: 59

## 2016-10-17 ENCOUNTER — Other Ambulatory Visit: Payer: 59

## 2016-10-23 ENCOUNTER — Encounter (INDEPENDENT_AMBULATORY_CARE_PROVIDER_SITE_OTHER): Payer: Self-pay

## 2016-10-23 ENCOUNTER — Other Ambulatory Visit: Payer: 59 | Admitting: *Deleted

## 2016-10-23 DIAGNOSIS — E785 Hyperlipidemia, unspecified: Secondary | ICD-10-CM

## 2016-10-23 LAB — HEPATIC FUNCTION PANEL
ALBUMIN: 4.7 g/dL (ref 3.6–5.1)
ALK PHOS: 65 U/L (ref 40–115)
ALT: 30 U/L (ref 9–46)
AST: 25 U/L (ref 10–35)
BILIRUBIN INDIRECT: 0.4 mg/dL (ref 0.2–1.2)
Bilirubin, Direct: 0.1 mg/dL (ref <=0.2)
TOTAL PROTEIN: 7 g/dL (ref 6.1–8.1)
Total Bilirubin: 0.5 mg/dL (ref 0.2–1.2)

## 2016-10-23 LAB — LIPID PANEL
CHOLESTEROL: 150 mg/dL (ref 125–200)
HDL: 42 mg/dL (ref >=40)
LDL Cholesterol: 66 mg/dL (ref <130)
TRIGLYCERIDES: 211 mg/dL — AB (ref <150)
Total CHOL/HDL Ratio: 3.6 Ratio (ref <=5.0)
VLDL: 42 mg/dL — ABNORMAL HIGH (ref <30)

## 2016-10-24 ENCOUNTER — Other Ambulatory Visit: Payer: Self-pay

## 2016-10-24 DIAGNOSIS — E7849 Other hyperlipidemia: Secondary | ICD-10-CM

## 2016-10-30 ENCOUNTER — Other Ambulatory Visit (HOSPITAL_COMMUNITY): Payer: Self-pay | Admitting: Hematology and Oncology

## 2016-10-30 DIAGNOSIS — C189 Malignant neoplasm of colon, unspecified: Secondary | ICD-10-CM

## 2016-11-08 ENCOUNTER — Ambulatory Visit (HOSPITAL_COMMUNITY)
Admission: RE | Admit: 2016-11-08 | Discharge: 2016-11-08 | Disposition: A | Discharge status: Home / Self Care | Payer: 59 | Source: Ambulatory Visit | Attending: Hematology and Oncology | Admitting: Hematology and Oncology

## 2016-11-08 ENCOUNTER — Encounter (HOSPITAL_COMMUNITY): Payer: Self-pay

## 2016-11-08 ENCOUNTER — Other Ambulatory Visit (HOSPITAL_COMMUNITY): Payer: Self-pay | Admitting: Hematology and Oncology

## 2016-11-08 DIAGNOSIS — K769 Liver disease, unspecified: Secondary | ICD-10-CM | POA: Diagnosis not present

## 2016-11-08 DIAGNOSIS — I700 Atherosclerosis of aorta: Secondary | ICD-10-CM | POA: Insufficient documentation

## 2016-11-08 DIAGNOSIS — I2510 Atherosclerotic heart disease of native coronary artery without angina pectoris: Secondary | ICD-10-CM | POA: Diagnosis not present

## 2016-11-08 DIAGNOSIS — C189 Malignant neoplasm of colon, unspecified: Secondary | ICD-10-CM

## 2016-11-08 DIAGNOSIS — R911 Solitary pulmonary nodule: Secondary | ICD-10-CM | POA: Insufficient documentation

## 2016-11-08 LAB — CREATININE, SERUM: CREATININE: 0.81 mg/dL (ref 0.61–1.24)

## 2016-11-08 MED ORDER — GADOBENATE DIMEGLUMINE 529 MG/ML IV SOLN
20.0000 mL | Freq: Once | INTRAVENOUS | Status: AC
  Administered 2016-11-08: 19 mL via INTRAVENOUS

## 2016-11-08 MED ORDER — IOPAMIDOL (ISOVUE-300) INJECTION 61%
75.0000 mL | Freq: Once | INTRAVENOUS | Status: AC | PRN
  Administered 2016-11-08: 75 mL via INTRAVENOUS

## 2016-11-08 MED ORDER — IOPAMIDOL (ISOVUE-300) INJECTION 61%
INTRAVENOUS | Status: DC
Start: 2016-11-08 — End: 2016-11-09
  Filled 2016-11-08: qty 75

## 2016-11-12 ENCOUNTER — Other Ambulatory Visit (HOSPITAL_COMMUNITY): Payer: Self-pay | Admitting: Hematology and Oncology

## 2016-11-12 DIAGNOSIS — C787 Secondary malignant neoplasm of liver and intrahepatic bile duct: Secondary | ICD-10-CM | POA: Diagnosis not present

## 2016-11-12 DIAGNOSIS — C189 Malignant neoplasm of colon, unspecified: Secondary | ICD-10-CM | POA: Diagnosis not present

## 2016-11-13 ENCOUNTER — Institutional Professional Consult (permissible substitution) (INDEPENDENT_AMBULATORY_CARE_PROVIDER_SITE_OTHER): Payer: 59 | Admitting: Cardiothoracic Surgery

## 2016-11-13 ENCOUNTER — Other Ambulatory Visit: Payer: Self-pay | Admitting: *Deleted

## 2016-11-13 ENCOUNTER — Encounter: Payer: Self-pay | Admitting: Cardiothoracic Surgery

## 2016-11-13 VITALS — BP 127/75 | HR 71 | Resp 16 | Ht 75.0 in | Wt 200.0 lb

## 2016-11-13 DIAGNOSIS — Z85038 Personal history of other malignant neoplasm of large intestine: Secondary | ICD-10-CM | POA: Diagnosis not present

## 2016-11-13 DIAGNOSIS — R911 Solitary pulmonary nodule: Secondary | ICD-10-CM

## 2016-11-13 DIAGNOSIS — K769 Liver disease, unspecified: Secondary | ICD-10-CM

## 2016-11-13 DIAGNOSIS — D491 Neoplasm of unspecified behavior of respiratory system: Secondary | ICD-10-CM | POA: Diagnosis not present

## 2016-11-13 NOTE — Progress Notes (Signed)
[address]       [place] [address]             [phone]                    Platon Wroblewski [place] Medical Record [id number] Date of Birth: [birth year]  Referring: [name], MD Primary Care: [name], MD Oncologist: Dr  Rushie Nyhan Chief Complaint:    Chief Complaint  Patient presents with  . Lung Lesion    LUL..CT CHEST 11/08/16.Marland KitchenMarland KitchenPET has been scheduled    History of Present Illness:    Allen Glenn 60 y.o. male is seen in the office  today for a new just under 1 cm pulmonary nodule in the lingula. This area was not present on a CT scan of the chest done in May 2017. The patient was being followed by oncology after a RIGHT HEMICOLECTOMY LAPAROSCOPIC N/A 05/04/2015  and HEPATECTOMY PARTIAL Right 09/28/2015.  Following his colon resection in May 2016 he underwent 4 cycles of chemotherapy . He is currently under surveillance without active treatment.   The patient also has a history of acute anterior myocardial infarction treated with a stent to the LAD in 2014.  Current Activity/ Functional Status:  Patient is independent with mobility/ambulation, transfers, ADL's, IADL's.   Zubrod Score: At the time of surgery this patient's most appropriate activity status/level should be described as: [x]     0    Normal activity, no symptoms []     1    Restricted in physical strenuous activity but ambulatory, able to do out light work []     2    Ambulatory and capable of self care, unable to do work activities, up and about               >50 % of waking hours                              []     3    Only limited self care, in bed greater than 50% of waking hours []     4    Completely disabled, no self care, confined to bed or chair []     5    Moribund   Past Medical History:  Diagnosis Date  . Acute MI 2014   acute ST elevation MI  . Colon cancer ([facility]) 06/13/2016   Status post resection of colon mass is well as he panic metastasis.   . Coronary artery  disease   . Heart disease     Past Surgical History:  Procedure Laterality Date  . CORONARY STENT PLACEMENT  06-27-2013   . COLON SURGERY  . COLONOSCOPY  . CORONARY ANGIOPLASTY WITH STENT PLACEMENT 2014  . HEPATECTOMY PARTIAL Right 09/28/2015  Procedure: HEPATECTOMY PARTIAL LAPAROSCOPIC ASSISTED; Surgeon: Patsy Baltimore, MD; Location: [facility]; Service: General; Laterality: Right;  . HERNIA REPAIR 1991  left inguinal  . LIVER RESECTION  . RIGHT HEMICOLECTOMY LAPAROSCOPIC N/A 05/04/2015  Procedure: RIGHT HEMICOLECTOMY LAPAROSCOPIC; Surgeon: Marland Kitchen, MD; Location: [facility] MAIN OR; Service: General; Laterality: N/A;  . UPPER GASTROINTESTINAL ENDOSCOPY       Family History  Problem Relation Age of Onset  . Neuropathy Neg Hx   . Hypertension Mother   . Heart disease Mother   . Hypertension Father   . Heart disease Father     Social History   Social History  .  Marital status: Married    Spouse name: [name]  . Number of children: 2  . Years of education: Bachelor   Occupational History  . Minister    Social History Main Topics  . Smoking status: Never Smoker  . Smokeless tobacco: Never Used  . Alcohol use No  . Drug use: No  . Sexual activity: Not on file   Other Topics Concern  . Not on file   Social History Narrative   Live at home with wife.   Caffeine: very little soda, occasional tea. 12oz-16oz/per    History  Smoking Status  . Never Smoker  Smokeless Tobacco  . Never Used    History  Alcohol Use No     No Known Allergies  Current Outpatient Prescriptions  Medication Sig Dispense Refill  . aspirin EC 81 MG tablet Take 81 mg by mouth daily.    Marland Kitchen atorvastatin (LIPITOR) 10 MG tablet Take 1 tablet (10 mg total) by mouth daily. 30 tablet 6  . Calcium-Magnesium-Vitamin D (CALCIUM MAGNESIUM PO) Take 1 tablet by mouth daily.    . clopidogrel (PLAVIX) 75 MG tablet Take 75 mg by mouth daily.    Marland Kitchen lisinopril (PRINIVIL,ZESTRIL) 2.5 MG tablet  Take 2.5 mg by mouth daily.    . Multiple Vitamin (MULTIVITAMIN) capsule Take 1 capsule by mouth.    . nitroGLYCERIN (NITROSTAT) 0.4 MG SL tablet 1 TAB UNDER TONGUE EVERY 5 MINUTES FOR 3 DOSES AS NEEDED FOR CHEST PAIN. CALL 911 IF PAIN PERSISTS    . tamsulosin (FLOMAX) 0.4 MG CAPS capsule Take 0.4 mg by mouth daily.     No current facility-administered medications for this visit.       Review of Systems:     Cardiac Review of Systems: Y or N  Chest Pain [  n  ]  Resting SOB [n   ] Exertional SOB  [n  ]  Orthopnea Florencio.Farrier  ]   Pedal Edema [  n ]    Palpitations [ n ] Syncope  [ n]   Presyncope [   n]  General Review of Systems: [Y] = yes [  ]=no Constitional: recent weight change n[  ];  Wt loss over the last 3 months [   ] anorexia [  ]; fatigue [  ]; nausea [  ]; night sweats [  ]; fever [  ]; or chills [  ];          Dental: poor dentition[  ]; Last Dentist visit:   Eye : blurred vision [  ]; diplopia [   ]; vision changes [  ];  Amaurosis fugax[  ]; Resp: cough [n  ];  wheezing[ n ];  hemoptysis[ n ]; shortness of breath[ n ]; paroxysmal nocturnal dyspnea[ n ]; dyspnea on exertion[ n ]; or orthopnea[  ];  GI:  gallstones[  ], vomiting[  ];  dysphagia[  ]; melena[  ];  hematochezia [  ]; heartburn[  ];   Hx of  Colonoscopy[  ]; GU: kidney stones [  ]; hematuria[  ];   dysuria [  ];  nocturia[  ];  history of     obstruction [  ]; urinary frequency [  ]             Skin: rash, swelling[  ];, hair loss[  ];  peripheral edema[  ];  or itching[  ]; Musculosketetal: myalgias[  ];  joint swelling[  ];  joint erythema[  ];  joint  pain[  ];  back pain[  ];  Heme/Lymph: bruising[n  ];  bleeding[  ];  anemia[  ];  Neuro: TIA[  n];  headaches[  ];  stroke[  ];  vertigo[  ];  seizures[n  ];   paresthesias[  ];  difficulty walkingn[  ];  Psych:depression[  ]; anxiety[  ];  Endocrine: diabetes[y  ];  thyroid dysfunction[ n ];  Immunizations: Flu up to date [  ]; Pneumococcal up to date [   ];  Other:  Physical Exam: BP 127/75 (BP Location: Left Arm, Patient Position: Sitting, Cuff Size: Large)   Pulse 71   Resp 16   Ht 6\' 3"  (1.905 m)   Wt 200 lb (90.7 kg)   SpO2 95% Comment: ON RA  BMI 25.00 kg/m   PHYSICAL EXAMINATION: General appearance: alert, cooperative and no distress Head: Normocephalic, without obvious abnormality, atraumatic Neck: no adenopathy, no carotid bruit, no JVD, supple, symmetrical, trachea midline and thyroid not enlarged, symmetric, no tenderness/mass/nodules Lymph nodes: Cervical, supraclavicular, and axillary nodes normal. Resp: clear to auscultation bilaterally Back: symmetric, no curvature. ROM normal. No CVA tenderness. Cardio: regular rate and rhythm, S1, S2 normal, no murmur, click, rub or gallop GI: soft, non-tender; bowel sounds normal; no masses,  no organomegaly and Patient has well healed mid abdominal incision and multiple port sites Extremities: extremities normal, atraumatic, no cyanosis or edema Neurologic: Grossly normal  Diagnostic Studies & Laboratory data:     Recent Radiology Findings:   Ct Chest W Contrast  Result Date: 11/09/2016 CLINICAL DATA:  Current history of colon cancer. EXAM: CT CHEST WITH CONTRAST TECHNIQUE: Multidetector CT imaging of the chest was performed during intravenous contrast administration. CONTRAST:  33mL ISOVUE-300 IOPAMIDOL (ISOVUE-300) INJECTION 61% COMPARISON:  CT scan of May 07, 2016. FINDINGS: Cardiovascular: Atherosclerosis of thoracic aorta is noted without aneurysm or dissection. Coronary artery calcifications are noted. Mediastinum/Nodes: No significant mediastinal mass or adenopathy is noted. Lungs/Pleura: No pneumothorax or pleural effusion is noted. Stable mild bibasilar scarring is noted. New 9 mm nodule is noted in left perihilar region in left upper lobe best seen on image number 82 of series 5. Upper Abdomen: Stable 2.2 cm low density is seen in dome of right hepatic lobe. Musculoskeletal:  No significant osseous abnormality is noted. IMPRESSION: Aortic atherosclerosis. Coronary artery calcifications are noted suggesting coronary artery disease. Stable 2.2 cm low density seen in right hepatic lobe concerning for possible metastatic disease. New 9 mm nodule seen in left upper lobe concerning for metastatic disease. These results will be called to the ordering clinician or representative by the Radiologist Assistant, and communication documented in the PACS or zVision Dashboard. Electronically Signed   By: Marijo Conception, M.D.   On: 11/09/2016 08:43   Mr Abdomen Wwo Contrast  Result Date: 11/09/2016 CLINICAL DATA:  Colon carcinoma. Follow-up indeterminate hepatic lesion seen on previous exams. EXAM: MRI ABDOMEN WITHOUT AND WITH CONTRAST TECHNIQUE: Multiplanar multisequence MR imaging of the abdomen was performed both before and after the administration of intravenous contrast. CONTRAST:  58mL MULTIHANCE GADOBENATE DIMEGLUMINE 529 MG/ML IV SOLN COMPARISON:  MRI on 07/09/2016 and CT on 05/07/2016 FINDINGS: Lower chest: No acute findings. Hepatobiliary: A well-circumscribed hypovascular lesion is again seen in the anterior liver dome near the junction of the right and left hepatic lobes. This measures 1.6 x 2.6 cm on image 38/1502, and is not significantly changed in size compared to prior exams. This shows mild internal enhancement and peripheral rim enhancement, but shows  near T2 isointensity to normal hepatic parenchyma. This has nonspecific characteristics. Although interval stability suggests a benign etiology, liver metastasis cannot be excluded. No other liver masses are identified. Pancreas: No mass or inflammatory changes. No evidence of pancreatic ductal dilatation. Incidental note is made of pancreas divisum. Spleen:  Within normal limits in size and appearance. Adrenals/Urinary Tract: No masses identified. Stable tiny benign right renal cyst. No evidence of hydronephrosis. Stomach/Bowel:  Visualized portions within the abdomen are unremarkable. Vascular/Lymphatic: No pathologically enlarged lymph nodes identified. No abdominal aortic aneurysm demonstrated. Other:  None. Musculoskeletal:  No suspicious bone lesions identified. IMPRESSION: Stable 2.6 cm hypovascular lesion in the anterior liver dome, which has indeterminate characteristics. Although this remains stable compared to recent studies, liver metastasis cannot definitely be excluded. This is in a difficult location to perform percutaneous biopsy ; consider continued followup by MRI in 6 months. No other liver lesions or evidence of metastatic disease within the abdomen. Electronically Signed   By: Earle Gell M.D.   On: 11/09/2016 10:14     I have independently reviewed the above radiologic studies.  Recent Lab Findings: Lab Results  Component Value Date   WBC 4.8 04/10/2015   HGB 8.1 (L) 04/10/2015   HCT 29.1 (L) 04/10/2015   PLT 205 04/10/2015   GLUCOSE 102 (H) 04/09/2015   CHOL 150 10/23/2016   TRIG 211 (H) 10/23/2016   HDL 42 10/23/2016   LDLCALC 66 10/23/2016   ALT 30 10/23/2016   AST 25 10/23/2016   NA 136 04/09/2015   K 4.1 04/09/2015   CL 103 04/09/2015   CREATININE 0.81 11/08/2016   BUN 14 04/09/2015   CO2 24 04/09/2015   TSH 1.770 03/07/2015   INR 1.03 04/10/2015   HGBA1C 6.4 (H) 03/07/2015      Assessment / Plan:     1/Stage IV carcinoma the colon, following colon resection and liver resection now with 9 mm nodule in the lingula medially Suspicious for metastatic disease .  2/Stable 2.6 cm hypovascular lesion in the anterior liver dome, which has indeterminate characteristics.- This may be persistent scarred area of the liver   from the previous resection.  3/history of previous anterior myocardial infarction treated acutely with stenting of the LAD 2014 currently on aspirin and Plavix   On review of the patient's current scans and previous history, the lesion in the lingula/ left lung is most  likely metastatic colon cancer. I reviewed the  Findings with the patient and his wife. He will keep his appointment early next week for PET scan, we'll also obtain pulmonary function studies at that time. Depending on the findings of the PET scan we proceed with navigation bronchoscopy and attempted biopsy of the left lung mass. I discussed with the patient treatment options if this was a metastatic deposit including possible stereotactic radiotherapy or lingulectomy. Patient's scans and history but presented at the multidisciplinary thoracic oncology conference also. The patient would like to tentatively proceed with biopsy on Wednesday, December 6  I  spent 40 minutes counseling the patient face to face and 50% or more the  time was spent in counseling and coordination of care. The total time spent in the appointment was 60 minutes.  [name] MD      [address] [place] [address] Office [phone]   Beeper [phone]  11/13/2016 3:04 PM

## 2016-11-19 ENCOUNTER — Ambulatory Visit (HOSPITAL_COMMUNITY)
Admission: RE | Admit: 2016-11-19 | Discharge: 2016-11-19 | Disposition: A | Discharge status: Home / Self Care | Payer: 59 | Source: Ambulatory Visit | Attending: Cardiothoracic Surgery | Admitting: Cardiothoracic Surgery

## 2016-11-19 ENCOUNTER — Encounter (HOSPITAL_COMMUNITY)
Admission: RE | Admit: 2016-11-19 | Discharge: 2016-11-19 | Disposition: A | Discharge status: Home / Self Care | Payer: 59 | Source: Ambulatory Visit | Attending: Hematology and Oncology | Admitting: Hematology and Oncology

## 2016-11-19 DIAGNOSIS — I700 Atherosclerosis of aorta: Secondary | ICD-10-CM | POA: Insufficient documentation

## 2016-11-19 DIAGNOSIS — R911 Solitary pulmonary nodule: Secondary | ICD-10-CM | POA: Insufficient documentation

## 2016-11-19 DIAGNOSIS — K8020 Calculus of gallbladder without cholecystitis without obstruction: Secondary | ICD-10-CM | POA: Insufficient documentation

## 2016-11-19 DIAGNOSIS — C189 Malignant neoplasm of colon, unspecified: Secondary | ICD-10-CM | POA: Diagnosis not present

## 2016-11-19 DIAGNOSIS — I2510 Atherosclerotic heart disease of native coronary artery without angina pectoris: Secondary | ICD-10-CM | POA: Diagnosis not present

## 2016-11-19 LAB — PULMONARY FUNCTION TEST
DL/VA % pred: 97 %
DL/VA: 4.7 ml/min/mmHg/L
DLCO unc % pred: 70 %
DLCO unc: 26.57 ml/min/mmHg
FEF 25-75 Post: 2.58 L/sec
FEF 25-75 Pre: 2.08 L/sec
FEF2575-%Change-Post: 23 %
FEF2575-%Pred-Post: 76 %
FEF2575-%Pred-Pre: 61 %
FEV1-%Change-Post: 4 %
FEV1-%Pred-Post: 73 %
FEV1-%Pred-Pre: 69 %
FEV1-Post: 3.03 L
FEV1-Pre: 2.9 L
FEV1FVC-%Change-Post: 5 %
FEV1FVC-%Pred-Pre: 95 %
FEV6-%Change-Post: 0 %
FEV6-%Pred-Post: 74 %
FEV6-%Pred-Pre: 74 %
FEV6-Post: 3.89 L
FEV6-Pre: 3.89 L
FEV6FVC-%Change-Post: 0 %
FEV6FVC-%Pred-Post: 103 %
FEV6FVC-%Pred-Pre: 102 %
FVC-%Change-Post: -1 %
FVC-%Pred-Post: 72 %
FVC-%Pred-Pre: 72 %
FVC-Post: 3.94 L
FVC-Pre: 3.98 L
Post FEV1/FVC ratio: 77 %
Post FEV6/FVC ratio: 99 %
Pre FEV1/FVC ratio: 73 %
Pre FEV6/FVC Ratio: 98 %
RV % pred: 78 %
RV: 1.93 L
TLC % pred: 77 %
TLC: 6.05 L

## 2016-11-19 LAB — GLUCOSE, CAPILLARY: GLUCOSE-CAPILLARY: 147 mg/dL — AB (ref 65–99)

## 2016-11-19 MED ORDER — FLUDEOXYGLUCOSE F - 18 (FDG) INJECTION
9.7100 | Freq: Once | INTRAVENOUS | Status: AC | PRN
  Administered 2016-11-19: 9.71 via INTRAVENOUS

## 2016-11-19 MED ORDER — ALBUTEROL SULFATE (2.5 MG/3ML) 0.083% IN NEBU
2.5000 mg | INHALATION_SOLUTION | Freq: Once | RESPIRATORY_TRACT | Status: AC
  Administered 2016-11-19: 2.5 mg via RESPIRATORY_TRACT

## 2016-11-20 ENCOUNTER — Telehealth: Payer: Self-pay | Admitting: Cardiothoracic Surgery

## 2016-11-20 ENCOUNTER — Other Ambulatory Visit: Payer: Self-pay | Admitting: *Deleted

## 2016-11-20 DIAGNOSIS — R911 Solitary pulmonary nodule: Secondary | ICD-10-CM

## 2016-11-20 DIAGNOSIS — J019 Acute sinusitis, unspecified: Secondary | ICD-10-CM | POA: Diagnosis not present

## 2016-11-20 MED FILL — AMOX-CLAV 875-125 MG TABLET: 875-125 | 10 days supply | Qty: 20 | Fill #0

## 2016-11-20 NOTE — Telephone Encounter (Signed)
Called and reviewed PET scan results with patient. Will continue with plan for ENB and EBUS dec 6 [name] MD      [address] [place] [address] Office [phone]   [place]

## 2016-11-23 MED FILL — ATORVASTATIN 10 MG TABLET: 10 | 90 days supply | Qty: 90 | Fill #2

## 2016-11-23 MED FILL — CLOPIDOGREL 75 MG TABLET: 75 | 90 days supply | Qty: 90 | Fill #1

## 2016-11-23 MED FILL — TAMSULOSIN HCL 0.4 MG CAP: 0.4 | 90 days supply | Qty: 90 | Fill #1

## 2016-11-27 ENCOUNTER — Encounter (HOSPITAL_COMMUNITY): Payer: Self-pay

## 2016-11-27 ENCOUNTER — Encounter (HOSPITAL_COMMUNITY)
Admission: RE | Admit: 2016-11-27 | Discharge: 2016-11-27 | Disposition: A | Discharge status: Home / Self Care | Payer: 59 | Source: Ambulatory Visit | Attending: Cardiothoracic Surgery | Admitting: Cardiothoracic Surgery

## 2016-11-27 ENCOUNTER — Other Ambulatory Visit: Payer: Self-pay

## 2016-11-27 ENCOUNTER — Ambulatory Visit (HOSPITAL_COMMUNITY)
Admission: RE | Admit: 2016-11-27 | Discharge: 2016-11-27 | Disposition: A | Discharge status: Home / Self Care | Payer: 59 | Source: Ambulatory Visit | Attending: Cardiothoracic Surgery | Admitting: Cardiothoracic Surgery

## 2016-11-27 DIAGNOSIS — J95811 Postprocedural pneumothorax: Secondary | ICD-10-CM | POA: Diagnosis not present

## 2016-11-27 DIAGNOSIS — J984 Other disorders of lung: Secondary | ICD-10-CM | POA: Diagnosis not present

## 2016-11-27 DIAGNOSIS — Z01812 Encounter for preprocedural laboratory examination: Secondary | ICD-10-CM

## 2016-11-27 DIAGNOSIS — R911 Solitary pulmonary nodule: Secondary | ICD-10-CM

## 2016-11-27 DIAGNOSIS — N400 Enlarged prostate without lower urinary tract symptoms: Secondary | ICD-10-CM | POA: Diagnosis not present

## 2016-11-27 DIAGNOSIS — I252 Old myocardial infarction: Secondary | ICD-10-CM | POA: Diagnosis not present

## 2016-11-27 DIAGNOSIS — D649 Anemia, unspecified: Secondary | ICD-10-CM | POA: Diagnosis not present

## 2016-11-27 DIAGNOSIS — C787 Secondary malignant neoplasm of liver and intrahepatic bile duct: Secondary | ICD-10-CM | POA: Diagnosis not present

## 2016-11-27 DIAGNOSIS — Z01818 Encounter for other preprocedural examination: Secondary | ICD-10-CM | POA: Diagnosis not present

## 2016-11-27 DIAGNOSIS — E785 Hyperlipidemia, unspecified: Secondary | ICD-10-CM | POA: Diagnosis not present

## 2016-11-27 DIAGNOSIS — Z01810 Encounter for preprocedural cardiovascular examination: Secondary | ICD-10-CM

## 2016-11-27 DIAGNOSIS — K8020 Calculus of gallbladder without cholecystitis without obstruction: Secondary | ICD-10-CM | POA: Diagnosis not present

## 2016-11-27 DIAGNOSIS — G629 Polyneuropathy, unspecified: Secondary | ICD-10-CM | POA: Diagnosis not present

## 2016-11-27 DIAGNOSIS — I700 Atherosclerosis of aorta: Secondary | ICD-10-CM | POA: Diagnosis not present

## 2016-11-27 DIAGNOSIS — Z955 Presence of coronary angioplasty implant and graft: Secondary | ICD-10-CM | POA: Diagnosis not present

## 2016-11-27 DIAGNOSIS — Z7982 Long term (current) use of aspirin: Secondary | ICD-10-CM | POA: Diagnosis not present

## 2016-11-27 DIAGNOSIS — I10 Essential (primary) hypertension: Secondary | ICD-10-CM | POA: Diagnosis not present

## 2016-11-27 DIAGNOSIS — I2510 Atherosclerotic heart disease of native coronary artery without angina pectoris: Secondary | ICD-10-CM | POA: Diagnosis not present

## 2016-11-27 DIAGNOSIS — Z9049 Acquired absence of other specified parts of digestive tract: Secondary | ICD-10-CM | POA: Diagnosis not present

## 2016-11-27 DIAGNOSIS — Z85038 Personal history of other malignant neoplasm of large intestine: Secondary | ICD-10-CM | POA: Diagnosis not present

## 2016-11-27 DIAGNOSIS — Z7902 Long term (current) use of antithrombotics/antiplatelets: Secondary | ICD-10-CM | POA: Diagnosis not present

## 2016-11-27 HISTORY — DX: Polyneuropathy, unspecified: G62.9

## 2016-11-27 HISTORY — DX: Essential (primary) hypertension: I10

## 2016-11-27 HISTORY — DX: Anemia, unspecified: D64.9

## 2016-11-27 HISTORY — DX: Nocturia: R35.1

## 2016-11-27 HISTORY — DX: Hyperlipidemia, unspecified: E78.5

## 2016-11-27 HISTORY — DX: Solitary pulmonary nodule: R91.1

## 2016-11-27 HISTORY — DX: Anesthesia of skin: R20.0

## 2016-11-27 HISTORY — DX: Benign prostatic hyperplasia without lower urinary tract symptoms: N40.0

## 2016-11-27 HISTORY — DX: Personal history of other medical treatment: Z92.89

## 2016-11-27 LAB — COMPREHENSIVE METABOLIC PANEL
ALT: 32 U/L (ref 17–63)
AST: 24 U/L (ref 15–41)
Albumin: 3.9 g/dL (ref 3.5–5.0)
Alkaline Phosphatase: 72 U/L (ref 38–126)
Anion gap: 8 (ref 5–15)
BUN: 12 mg/dL (ref 6–20)
CO2: 24 mmol/L (ref 22–32)
Calcium: 8.5 mg/dL — ABNORMAL LOW (ref 8.9–10.3)
Chloride: 105 mmol/L (ref 101–111)
Creatinine, Ser: 0.73 mg/dL (ref 0.61–1.24)
GFR calc Af Amer: >60 mL/min (ref >60)
GFR calc non Af Amer: >60 mL/min (ref >60)
Glucose, Bld: 116 mg/dL — ABNORMAL HIGH (ref 65–99)
Potassium: 3.9 mmol/L (ref 3.5–5.1)
Sodium: 137 mmol/L (ref 135–145)
Total Bilirubin: 0.6 mg/dL (ref 0.3–1.2)
Total Protein: 7 g/dL (ref 6.5–8.1)

## 2016-11-27 LAB — PROTIME-INR
INR: 0.94
Prothrombin Time: 12.5 seconds (ref 11.4–15.2)

## 2016-11-27 LAB — APTT: aPTT: 31 seconds (ref 24–36)

## 2016-11-27 LAB — CBC
HCT: 44.2 % (ref 39.0–52.0)
Hemoglobin: 14.9 g/dL (ref 13.0–17.0)
MCH: 30.1 pg (ref 26.0–34.0)
MCHC: 33.7 g/dL (ref 30.0–36.0)
MCV: 89.3 fL (ref 78.0–100.0)
Platelets: 210 10*3/uL (ref 150–400)
RBC: 4.95 MIL/uL (ref 4.22–5.81)
RDW: 12.3 % (ref 11.5–15.5)
WBC: 8.5 10*3/uL (ref 4.0–10.5)

## 2016-11-27 NOTE — Progress Notes (Addendum)
Cardiologist is Dr.H Tamala Julian with last visit in epic from 06-12-16  Medical Md is Dr.Stephen Olen Pel  Echo report in epic from 2014  Stress test in epic from 2015  Heart cath in epic from 2014  EKG in epic from 06-13-16

## 2016-11-27 NOTE — Pre-Procedure Instructions (Signed)
Allen Glenn  11/27/2016      [id number] - [place], [place] - [address] [place]  [address] Phone: [phone] Fax: [place], [place] - [address] [facility]. [address] [place] [address] Phone: [phone] Fax: [phone]    Your procedure is scheduled on Wed, Dec 6 @ 8:30 AM  Report to [facility] at 6:30 AM  Call this number if you have problems the morning of surgery:  [phone]   Remember:  Do not eat food or drink liquids after midnight.  Take these medicines the morning of surgery with A SIP OF WATER Flomax(Tamsulosin) and Claritin(Loratadine-if needed)             No Goody's,BC's,Aleve,Advil,Motrin,Ibuprofen,Fish Oil,or any Herbal Medications.    Do not wear jewelry.  Do not wear lotions, powders,colognes or deoderant.             Men may shave face and neck.  Do not bring valuables to the hospital.  [facility] is not responsible for any belongings or valuables.  Contacts, dentures or bridgework may not be worn into surgery.  Leave your suitcase in the car.  After surgery it may be brought to your room.  For patients admitted to the hospital, discharge time will be determined by your treatment team.  Patients discharged the day of surgery will not be allowed to drive home.   [facility] Pre[facility]  Before surgery, you can play an important role.  Because skin is not sterile, your skin needs to be as free of germs as possible.  You can reduce the number of germs on you skin by washing with CHG (chlorahexidine gluconate) soap before surgery.  CHG is an antiseptic cleaner which kills germs and bonds with the skin to continue killing germs even after washing.  Please DO NOT use if you have an allergy to CHG or antibacterial soaps.  If your skin becomes reddened/irritated stop using the CHG and inform  your nurse when you arrive at Short Stay.  Do not shave (including legs and underarms) for at least 48 hours prior to the first CHG shower.  You may shave your face.  Please follow these instructions carefully:   1.  Shower with CHG Soap the night before surgery and the                                morning of Surgery.  2.  If you choose to wash your hair, wash your hair first as usual with your       normal shampoo.  3.  After you shampoo, rinse your hair and body thoroughly to remove the                      Shampoo.  4.  Use CHG as you would any other liquid soap.  You can apply chg directly       to the skin and wash gently with scrungie or a clean washcloth.  5.  Apply the CHG Soap to your body ONLY FROM THE NECK DOWN.        Do not use on open wounds or open sores.  Avoid contact with your eyes,       ears, mouth and genitals (private parts).  Wash genitals (  private parts)       with your normal soap.  6.  Wash thoroughly, paying special attention to the area where your surgery        will be performed.  7.  Thoroughly rinse your body with warm water from the neck down.  8.  DO NOT shower/wash with your normal soap after using and rinsing off       the CHG Soap.  9.  Pat yourself dry with a clean towel.            10.  Wear clean pajamas.            11.  Place clean sheets on your bed the night of your first shower and do not        sleep with pets.  Day of Surgery  Do not apply any lotions/deoderants the morning of surgery.  Please wear clean clothes to the hospital/surgery center.    Please read over the following fact sheets that you were given. Coughing and Deep Breathing

## 2016-11-27 NOTE — Progress Notes (Signed)
Anesthesia Chart Review:  Pt is a 60 year old male scheduled for video bronchoscopy with endobronchial navigation and endobronchial ultrasound on 11/28/2016 with Lanelle Bal, M.D.  - Cardiologist is Daneen Schick, MD, last office visit 06/12/16 - PCP is [name], MD  PMH includes:  CAD (DES to LAD 2014), HTN, hyperlipidemia, colon cancer, lung nodule. Never smoker. BMI 25  Medications include: ASA, lipitor, plavix, lisinopril. Pt reports he stopped plavix about 6 days ago.   Preoperative labs reviewed.    CXR 11/27/16: No active cardiopulmonary disease.  EKG 06/13/16: NSR  Carotid duplex 04/06/15: This study is consistent with abnormally elevated blood flow velocity seen in the right/left proximal CCAs, mid/distal ICAs, right proximal ICA, left bulb, and left ECA inconsistent with stenotic narrowing of these vessels, but this finding was not supported by presence of plaques.  Nuclear stress test 12/15/14 (care everywhere): There appears to be homogenous distribution of Cardiolite on the stress images. There is no evidence of ischemia. There were no wall motion abnormalities noted. Ejection fraction is calculated at 66 %.  Echo 06/28/13 (care everywhere): - LV size is normal. Normal LV wall thickness. LV systolic function is normal. LV ejection fraction = 55%. LV filling pattern is normal. - Very mild distal anteroseptal and aplical hypokinesis. - RV is normal in size and function. - IVC size was mildly dilated. - There is no pericardial effusion.  Cardiac cath 06/27/13 (care everywhere): - LM: distal 20%  - LAD: proximal 98% thrombus, 30% ostial. Mid 60% just distal to second diagonal branch. S/p DES to proximal LAD - CX: Proximal 30% ostial. - RCA: Proximal 30% ostial.  If no changes, I anticipate pt can proceed with surgery as scheduled.   Willeen Cass, FNP-BC [facility] Stay Surgical [facility]/Anesthesiology Phone: [phone] 11/27/2016 2:29 PM

## 2016-11-28 ENCOUNTER — Encounter (HOSPITAL_COMMUNITY): Payer: Self-pay | Admitting: Urology

## 2016-11-28 ENCOUNTER — Ambulatory Visit (HOSPITAL_COMMUNITY): Payer: 59 | Admitting: Anesthesiology

## 2016-11-28 ENCOUNTER — Observation Stay (HOSPITAL_COMMUNITY): Payer: 59

## 2016-11-28 ENCOUNTER — Observation Stay (HOSPITAL_COMMUNITY)
Admission: RE | Admit: 2016-11-28 | Discharge: 2016-11-29 | Disposition: A | Discharge status: Home / Self Care | Payer: 59 | Source: Ambulatory Visit | Attending: Cardiothoracic Surgery | Admitting: Cardiothoracic Surgery

## 2016-11-28 ENCOUNTER — Ambulatory Visit (HOSPITAL_COMMUNITY): Payer: 59

## 2016-11-28 ENCOUNTER — Telehealth: Payer: Self-pay | Admitting: *Deleted

## 2016-11-28 ENCOUNTER — Encounter (HOSPITAL_COMMUNITY)
Admission: RE | Disposition: A | Discharge status: Home / Self Care | Payer: Self-pay | Source: Ambulatory Visit | Attending: Cardiothoracic Surgery

## 2016-11-28 ENCOUNTER — Ambulatory Visit (HOSPITAL_COMMUNITY): Payer: 59 | Admitting: Emergency Medicine

## 2016-11-28 DIAGNOSIS — M5416 Radiculopathy, lumbar region: Secondary | ICD-10-CM | POA: Diagnosis not present

## 2016-11-28 DIAGNOSIS — J939 Pneumothorax, unspecified: Secondary | ICD-10-CM | POA: Diagnosis not present

## 2016-11-28 DIAGNOSIS — D649 Anemia, unspecified: Secondary | ICD-10-CM | POA: Insufficient documentation

## 2016-11-28 DIAGNOSIS — R911 Solitary pulmonary nodule: Secondary | ICD-10-CM | POA: Diagnosis not present

## 2016-11-28 DIAGNOSIS — Z85038 Personal history of other malignant neoplasm of large intestine: Secondary | ICD-10-CM | POA: Insufficient documentation

## 2016-11-28 DIAGNOSIS — G629 Polyneuropathy, unspecified: Secondary | ICD-10-CM | POA: Insufficient documentation

## 2016-11-28 DIAGNOSIS — Z9049 Acquired absence of other specified parts of digestive tract: Secondary | ICD-10-CM | POA: Insufficient documentation

## 2016-11-28 DIAGNOSIS — E785 Hyperlipidemia, unspecified: Secondary | ICD-10-CM | POA: Insufficient documentation

## 2016-11-28 DIAGNOSIS — I2510 Atherosclerotic heart disease of native coronary artery without angina pectoris: Secondary | ICD-10-CM | POA: Insufficient documentation

## 2016-11-28 DIAGNOSIS — R05 Cough: Secondary | ICD-10-CM | POA: Diagnosis not present

## 2016-11-28 DIAGNOSIS — N400 Enlarged prostate without lower urinary tract symptoms: Secondary | ICD-10-CM | POA: Diagnosis not present

## 2016-11-28 DIAGNOSIS — J95811 Postprocedural pneumothorax: Secondary | ICD-10-CM | POA: Insufficient documentation

## 2016-11-28 DIAGNOSIS — Z7982 Long term (current) use of aspirin: Secondary | ICD-10-CM | POA: Insufficient documentation

## 2016-11-28 DIAGNOSIS — R0602 Shortness of breath: Secondary | ICD-10-CM | POA: Diagnosis not present

## 2016-11-28 DIAGNOSIS — C787 Secondary malignant neoplasm of liver and intrahepatic bile duct: Secondary | ICD-10-CM | POA: Insufficient documentation

## 2016-11-28 DIAGNOSIS — I700 Atherosclerosis of aorta: Secondary | ICD-10-CM | POA: Insufficient documentation

## 2016-11-28 DIAGNOSIS — J984 Other disorders of lung: Secondary | ICD-10-CM | POA: Diagnosis not present

## 2016-11-28 DIAGNOSIS — K8020 Calculus of gallbladder without cholecystitis without obstruction: Secondary | ICD-10-CM | POA: Insufficient documentation

## 2016-11-28 DIAGNOSIS — Z7902 Long term (current) use of antithrombotics/antiplatelets: Secondary | ICD-10-CM | POA: Insufficient documentation

## 2016-11-28 DIAGNOSIS — Z419 Encounter for procedure for purposes other than remedying health state, unspecified: Secondary | ICD-10-CM

## 2016-11-28 DIAGNOSIS — Z955 Presence of coronary angioplasty implant and graft: Secondary | ICD-10-CM | POA: Insufficient documentation

## 2016-11-28 DIAGNOSIS — I10 Essential (primary) hypertension: Secondary | ICD-10-CM | POA: Insufficient documentation

## 2016-11-28 DIAGNOSIS — J60 Coalworker's pneumoconiosis: Secondary | ICD-10-CM | POA: Diagnosis not present

## 2016-11-28 DIAGNOSIS — Z09 Encounter for follow-up examination after completed treatment for conditions other than malignant neoplasm: Secondary | ICD-10-CM

## 2016-11-28 DIAGNOSIS — I252 Old myocardial infarction: Secondary | ICD-10-CM | POA: Insufficient documentation

## 2016-11-28 HISTORY — PX: VIDEO BRONCHOSCOPY WITH ENDOBRONCHIAL NAVIGATION: SHX6175

## 2016-11-28 HISTORY — PX: LUNG BIOPSY: SHX5088

## 2016-11-28 HISTORY — PX: VIDEO BRONCHOSCOPY WITH ENDOBRONCHIAL ULTRASOUND: SHX6177

## 2016-11-28 SURGERY — VIDEO BRONCHOSCOPY WITH ENDOBRONCHIAL NAVIGATION
Anesthesia: General | Site: Chest

## 2016-11-28 MED ORDER — PROPOFOL 10 MG/ML IV BOLUS
INTRAVENOUS | Status: DC | PRN
  Administered 2016-11-28: 170 mg via INTRAVENOUS

## 2016-11-28 MED ORDER — POLYETHYLENE GLYCOL 3350 17 G PO PACK
17.0000 g | PACK | Freq: Every day | ORAL | Status: DC | PRN
Start: 2016-11-28 — End: 2016-11-29

## 2016-11-28 MED ORDER — LIDOCAINE HCL 4 % MT SOLN
OROMUCOSAL | Status: DC | PRN
  Administered 2016-11-28: 4 mL via TOPICAL

## 2016-11-28 MED ORDER — AMOXICILLIN 250 MG/5ML PO SUSR
875.0000 mg | Freq: Two times a day (BID) | ORAL | Status: DC
  Administered 2016-11-28: 875 mg via ORAL
  Filled 2016-11-28 (×2): qty 20

## 2016-11-28 MED ORDER — METOCLOPRAMIDE HCL 5 MG/ML IJ SOLN
INTRAMUSCULAR | Status: DC | PRN
  Administered 2016-11-28: 10 mg via INTRAVENOUS

## 2016-11-28 MED ORDER — ROCURONIUM BROMIDE 10 MG/ML (PF) SYRINGE
PREFILLED_SYRINGE | INTRAVENOUS | Status: AC
  Filled 2016-11-28: qty 10

## 2016-11-28 MED ORDER — ONDANSETRON HCL 4 MG/2ML IJ SOLN
4.0000 mg | Freq: Four times a day (QID) | INTRAMUSCULAR | Status: DC | PRN
Start: 2016-11-28 — End: 2016-11-29

## 2016-11-28 MED ORDER — SUGAMMADEX SODIUM 200 MG/2ML IV SOLN
INTRAVENOUS | Status: AC
  Filled 2016-11-28: qty 2

## 2016-11-28 MED ORDER — ARTIFICIAL TEARS OP OINT
TOPICAL_OINTMENT | OPHTHALMIC | Status: AC
  Filled 2016-11-28: qty 3.5

## 2016-11-28 MED ORDER — FENTANYL CITRATE (PF) 100 MCG/2ML IJ SOLN
INTRAMUSCULAR | Status: DC | PRN
  Administered 2016-11-28 (×2): 50 ug via INTRAVENOUS

## 2016-11-28 MED ORDER — METOCLOPRAMIDE HCL 5 MG/ML IJ SOLN
INTRAMUSCULAR | Status: AC
  Filled 2016-11-28: qty 2

## 2016-11-28 MED ORDER — LIDOCAINE 2% (20 MG/ML) 5 ML SYRINGE
INTRAMUSCULAR | Status: AC
  Filled 2016-11-28: qty 10

## 2016-11-28 MED ORDER — PROMETHAZINE HCL 25 MG/ML IJ SOLN: 6.2500 mg | INTRAMUSCULAR | Status: DC | PRN

## 2016-11-28 MED ORDER — FENTANYL CITRATE (PF) 100 MCG/2ML IJ SOLN
INTRAMUSCULAR | Status: AC
  Filled 2016-11-28: qty 2

## 2016-11-28 MED ORDER — 0.9 % SODIUM CHLORIDE (POUR BTL) OPTIME
TOPICAL | Status: DC | PRN
Start: 2016-11-28 — End: 2016-11-28
  Administered 2016-11-28: 1000 mL

## 2016-11-28 MED ORDER — ROCURONIUM BROMIDE 100 MG/10ML IV SOLN
INTRAVENOUS | Status: DC | PRN
  Administered 2016-11-28: 50 mg via INTRAVENOUS
  Administered 2016-11-28: 20 mg via INTRAVENOUS

## 2016-11-28 MED ORDER — PROPOFOL 10 MG/ML IV BOLUS
INTRAVENOUS | Status: AC
  Filled 2016-11-28: qty 20

## 2016-11-28 MED ORDER — TAMSULOSIN HCL 0.4 MG PO CAPS
0.4000 mg | ORAL_CAPSULE | Freq: Every day | ORAL | Status: DC
  Administered 2016-11-28: 0.4 mg via ORAL
  Filled 2016-11-28: qty 1

## 2016-11-28 MED ORDER — GUAIFENESIN ER 600 MG PO TB12: 600.0000 mg | ORAL_TABLET | Freq: Two times a day (BID) | ORAL | Status: DC | PRN

## 2016-11-28 MED ORDER — ACETAMINOPHEN 650 MG RE SUPP: 650.0000 mg | Freq: Four times a day (QID) | RECTAL | Status: DC | PRN

## 2016-11-28 MED ORDER — MIDAZOLAM HCL 5 MG/5ML IJ SOLN
INTRAMUSCULAR | Status: DC | PRN
  Administered 2016-11-28: 2 mg via INTRAVENOUS

## 2016-11-28 MED ORDER — DEXAMETHASONE SODIUM PHOSPHATE 10 MG/ML IJ SOLN
INTRAMUSCULAR | Status: AC
  Filled 2016-11-28: qty 1

## 2016-11-28 MED ORDER — LISINOPRIL 2.5 MG PO TABS
2.5000 mg | ORAL_TABLET | Freq: Every day | ORAL | Status: DC
  Administered 2016-11-28: 2.5 mg via ORAL
  Filled 2016-11-28: qty 1

## 2016-11-28 MED ORDER — TRAMADOL HCL 50 MG PO TABS
50.0000 mg | ORAL_TABLET | Freq: Four times a day (QID) | ORAL | Status: DC | PRN
  Administered 2016-11-28: 50 mg via ORAL
  Filled 2016-11-28: qty 1

## 2016-11-28 MED ORDER — SODIUM CHLORIDE 0.9% FLUSH
3.0000 mL | Freq: Two times a day (BID) | INTRAVENOUS | Status: DC
  Administered 2016-11-28: 3 mL via INTRAVENOUS

## 2016-11-28 MED ORDER — AMOXICILLIN 250 MG PO CHEW
875.0000 mg | CHEWABLE_TABLET | Freq: Two times a day (BID) | ORAL | Status: DC
  Filled 2016-11-28: qty 4

## 2016-11-28 MED ORDER — AMOXICILLIN 875 MG PO TABS: 875.0000 mg | ORAL_TABLET | Freq: Two times a day (BID) | ORAL | Status: DC

## 2016-11-28 MED ORDER — LIDOCAINE HCL (CARDIAC) 20 MG/ML IV SOLN
INTRAVENOUS | Status: DC | PRN
  Administered 2016-11-28: 60 mg via INTRAVENOUS

## 2016-11-28 MED ORDER — SODIUM CHLORIDE 0.9 % IV SOLN: 250.0000 mL | INTRAVENOUS | Status: DC | PRN

## 2016-11-28 MED ORDER — ARTIFICIAL TEARS OP OINT
TOPICAL_OINTMENT | OPHTHALMIC | Status: DC | PRN
  Administered 2016-11-28: 1 via OPHTHALMIC

## 2016-11-28 MED ORDER — FENTANYL CITRATE (PF) 100 MCG/2ML IJ SOLN: 25.0000 ug | INTRAMUSCULAR | Status: DC | PRN

## 2016-11-28 MED ORDER — SUGAMMADEX SODIUM 200 MG/2ML IV SOLN
INTRAVENOUS | Status: DC | PRN
  Administered 2016-11-28: 200 mg via INTRAVENOUS

## 2016-11-28 MED ORDER — SODIUM CHLORIDE 0.9% FLUSH: 3.0000 mL | INTRAVENOUS | Status: DC | PRN

## 2016-11-28 MED ORDER — ASPIRIN EC 81 MG PO TBEC: 81.0000 mg | DELAYED_RELEASE_TABLET | Freq: Every day | ORAL | Status: DC

## 2016-11-28 MED ORDER — ONDANSETRON HCL 4 MG/2ML IJ SOLN
INTRAMUSCULAR | Status: DC | PRN
Start: 2016-11-28 — End: 2016-11-28
  Administered 2016-11-28: 4 mg via INTRAVENOUS

## 2016-11-28 MED ORDER — LIDOCAINE 2% (20 MG/ML) 5 ML SYRINGE
INTRAMUSCULAR | Status: AC
  Filled 2016-11-28: qty 5

## 2016-11-28 MED ORDER — ONDANSETRON HCL 4 MG/2ML IJ SOLN
INTRAMUSCULAR | Status: AC
  Filled 2016-11-28: qty 2

## 2016-11-28 MED ORDER — ONDANSETRON HCL 4 MG PO TABS: 4.0000 mg | ORAL_TABLET | Freq: Four times a day (QID) | ORAL | Status: DC | PRN

## 2016-11-28 MED ORDER — ACETAMINOPHEN 325 MG PO TABS: 650.0000 mg | ORAL_TABLET | Freq: Four times a day (QID) | ORAL | Status: DC | PRN

## 2016-11-28 MED ORDER — MIDAZOLAM HCL 2 MG/2ML IJ SOLN
INTRAMUSCULAR | Status: AC
  Filled 2016-11-28: qty 2

## 2016-11-28 MED ORDER — CLOPIDOGREL BISULFATE 75 MG PO TABS: 75.0000 mg | ORAL_TABLET | Freq: Every day | ORAL | Status: DC

## 2016-11-28 MED ORDER — DEXAMETHASONE SODIUM PHOSPHATE 10 MG/ML IJ SOLN
INTRAMUSCULAR | Status: DC | PRN
  Administered 2016-11-28: 10 mg via INTRAVENOUS

## 2016-11-28 MED ORDER — LACTATED RINGERS IV SOLN
INTRAVENOUS | Status: DC | PRN
  Administered 2016-11-28 (×2): via INTRAVENOUS

## 2016-11-28 MED ORDER — ATORVASTATIN CALCIUM 10 MG PO TABS
10.0000 mg | ORAL_TABLET | Freq: Every day | ORAL | Status: DC
  Administered 2016-11-28: 10 mg via ORAL
  Filled 2016-11-28: qty 1

## 2016-11-28 SURGICAL SUPPLY — 39 items
ADAPTER BRONCH F/PENTAX (ADAPTER) ×3 IMPLANT
BRUSH BIOPSY BRONCH 10 SDTNB (MISCELLANEOUS) ×3 IMPLANT
BRUSH CYTOL CELLEBRITY 1.5X140 (MISCELLANEOUS) IMPLANT
BRUSH SUPERTRAX BIOPSY (INSTRUMENTS) IMPLANT
BRUSH SUPERTRAX NDL-TIP CYTO (INSTRUMENTS) IMPLANT
CANISTER SUCTION 2500CC (MISCELLANEOUS) ×6 IMPLANT
CHANNEL WORK EXTEND EDGE 180 (KITS) IMPLANT
CHANNEL WORK EXTEND EDGE 45 (KITS) IMPLANT
CHANNEL WORK EXTEND EDGE 90 (KITS) IMPLANT
CONT SPEC 4OZ CLIKSEAL STRL BL (MISCELLANEOUS) ×3 IMPLANT
COVER DOME SNAP 22 D (MISCELLANEOUS) ×3 IMPLANT
COVER TABLE BACK 60X90 (DRAPES) ×3 IMPLANT
DRSG AQUACEL AG ADV 3.5X14 (GAUZE/BANDAGES/DRESSINGS) ×3 IMPLANT
FILTER STRAW FLUID ASPIR (MISCELLANEOUS) IMPLANT
FORCEPS BIOP RJ4 1.8 (CUTTING FORCEPS) IMPLANT
FORCEPS BIOP SUPERTRX PREMAR (INSTRUMENTS) IMPLANT
GAUZE SPONGE 4X4 12PLY STRL (GAUZE/BANDAGES/DRESSINGS) ×6 IMPLANT
GLOVE BIO SURGEON STRL SZ 6.5 (GLOVE) ×6 IMPLANT
KIT CLEAN ENDO COMPLIANCE (KITS) ×12 IMPLANT
KIT PROCEDURE EDGE 180 (KITS) IMPLANT
KIT PROCEDURE EDGE 45 (KITS) IMPLANT
KIT PROCEDURE EDGE 90 (KITS) ×3 IMPLANT
KIT ROOM TURNOVER OR (KITS) ×6 IMPLANT
MARKER SKIN DUAL TIP RULER LAB (MISCELLANEOUS) ×3 IMPLANT
NEEDLE BIOPSY TRANSBRONCH 21G (NEEDLE) IMPLANT
NEEDLE BLUNT 18X1 FOR OR ONLY (NEEDLE) IMPLANT
NEEDLE EBUS SONO TIP PENTAX (NEEDLE) ×3 IMPLANT
NEEDLE SUPERTRX PREMARK BIOPSY (NEEDLE) IMPLANT
NS IRRIG 1000ML POUR BTL (IV SOLUTION) ×3 IMPLANT
OIL SILICONE PENTAX (PARTS (SERVICE/REPAIRS)) ×3 IMPLANT
PAD ARMBOARD 7.5X6 YLW CONV (MISCELLANEOUS) ×12 IMPLANT
PATCHES PATIENT (LABEL) ×9 IMPLANT
SYR 20CC LL (SYRINGE) ×3 IMPLANT
SYR 20ML ECCENTRIC (SYRINGE) ×3 IMPLANT
TOWEL OR 17X24 6PK STRL BLUE (TOWEL DISPOSABLE) ×3 IMPLANT
TRAP SPECIMEN MUCOUS 40CC (MISCELLANEOUS) ×3 IMPLANT
TUBE CONNECTING 20X1/4 (TUBING) ×3 IMPLANT
UNDERPAD 30X30 (UNDERPADS AND DIAPERS) IMPLANT
WATER STERILE IRR 1000ML POUR (IV SOLUTION) ×3 IMPLANT

## 2016-11-28 NOTE — Anesthesia Procedure Notes (Addendum)
Procedure Name: Intubation Date/Time: 11/28/2016 8:40 AM Performed by: Jacquiline Doe A Pre-anesthesia Checklist: Patient identified, Emergency Drugs available, Suction available and Patient being monitored Patient Re-evaluated:Patient Re-evaluated prior to inductionOxygen Delivery Method: Circle System Utilized and Circle system utilized Preoxygenation: Pre-oxygenation with 100% oxygen Intubation Type: IV induction and Cricoid Pressure applied Ventilation: Mask ventilation without difficulty Laryngoscope Size: Mac, 4 and Glidescope Grade View: Grade I Tube type: Oral Tube size: 8.5 mm Number of attempts: 1 Airway Equipment and Method: Oral airway,  Rigid stylet and Video-laryngoscopy Placement Confirmation: ETT inserted through vocal cords under direct vision,  positive ETCO2 and breath sounds checked- equal and bilateral Secured at: 23 cm Tube secured with: Tape Dental Injury: Teeth and Oropharynx as per pre-operative assessment  Difficulty Due To: Difficulty was unanticipated, Difficult Airway- due to reduced neck mobility, Difficult Airway- due to dentition and Difficult Airway- due to anterior larynx Future Recommendations: Recommend- induction with short-acting agent, and alternative techniques readily available Comments: Post induction , observed previous Anesthesia record 08/2015 , different campus ,  documenting difficult intubation requiring Glydescope for intubation . Recommend Glydescope for future intubation .

## 2016-11-28 NOTE — Anesthesia Preprocedure Evaluation (Signed)
Anesthesia Evaluation  Patient identified by MRN, date of birth, ID band Patient awake    Reviewed: Allergy & Precautions, NPO status , Patient's Chart, lab work & pertinent test results  Airway Mallampati: II  TM Distance: >3 FB Neck ROM: Full    Dental no notable dental hx.    Pulmonary neg pulmonary ROS,    Pulmonary exam normal breath sounds clear to auscultation       Cardiovascular hypertension, + CAD, + Past MI and + Cardiac Stents  negative cardio ROS Normal cardiovascular exam Rhythm:Regular Rate:Normal     Neuro/Psych negative neurological ROS  negative psych ROS   GI/Hepatic negative GI ROS, Neg liver ROS,   Endo/Other  negative endocrine ROS  Renal/GU negative Renal ROS  negative genitourinary   Musculoskeletal negative musculoskeletal ROS (+)   Abdominal   Peds negative pediatric ROS (+)  Hematology negative hematology ROS (+)   Anesthesia Other Findings   Reproductive/Obstetrics negative OB ROS                             Anesthesia Physical Anesthesia Plan  ASA: III  Anesthesia Plan: General   Post-op Pain Management:    Induction: Intravenous  Airway Management Planned: Oral ETT  Additional Equipment:   Intra-op Plan:   Post-operative Plan: Extubation in OR  Informed Consent: I have reviewed the patients History and Physical, chart, labs and discussed the procedure including the risks, benefits and alternatives for the proposed anesthesia with the patient or authorized representative who has indicated his/her understanding and acceptance.   Dental advisory given  Plan Discussed with: CRNA and Surgeon  Anesthesia Plan Comments:         Anesthesia Quick Evaluation

## 2016-11-28 NOTE — Addendum Note (Signed)
Addendum  created 11/28/16 1252 by Fidela Juneau, CRNA   Anesthesia Intra LDAs edited, LDA properties accepted

## 2016-11-28 NOTE — H&P (Signed)
[address]       [place] [address]             [phone]                                                   Allen Glenn [facility] Medical Record [id number] Date of Birth: 1956/10/09  Referring: No ref. provider found Primary Care: [name], MD Oncologist: Dr  Rushie Nyhan Chief Complaint:    Lung mass  History of Present Illness:    Allen Glenn 60 y.o. male is seen in the office for a new just under 1 cm pulmonary nodule in the lingula. This area was not present on a CT scan of the chest done in May 2017. The patient was being followed by oncology after a RIGHT HEMICOLECTOMY LAPAROSCOPIC N/A 05/04/2015  and HEPATECTOMY PARTIAL Right 09/28/2015.  Following his colon resection in May 2016 he underwent 4 cycles of chemotherapy . He is currently under surveillance without active treatment.   The patient also has a history of acute anterior myocardial infarction treated with a stent to the LAD in 2014.  Current Activity/ Functional Status:  Patient is independent with mobility/ambulation, transfers, ADL's, IADL's.   Zubrod Score: At the time of surgery this patient's most appropriate activity status/level should be described as: [x]     0    Normal activity, no symptoms []     1    Restricted in physical strenuous activity but ambulatory, able to do out light work []     2    Ambulatory and capable of self care, unable to do work activities, up and about               >50 % of waking hours                              []     3    Only limited self care, in bed greater than 50% of waking hours []     4    Completely disabled, no self care, confined to bed or chair []     5    Moribund       Past Medical History:  Diagnosis Date  . Acute MI 2014   acute ST elevation MI  . Anemia   . Colon cancer ([facility] [place]) 06/13/2016   Status post resection of colon mass is well as he panic metastasis.   . Coronary artery disease   . Enlarged prostate    slightly  and takes Flomax daily  . Heart disease   . History of blood transfusion   . Hyperlipidemia    takes Lipitor daily  . Hypertension    takes Lisinopril daily  . Lung nodule    left  . Nocturia   . Numbness    left foot  . Peripheral neuropathy [facility])          Past Surgical History:  Procedure Laterality Date  . 1/8 of liver removed    . APPENDECTOMY    . COLONOSCOPY    . CORONARY ANGIOPLASTY     1 stent  . CORONARY STENT PLACEMENT  06-27-2013  . HERNIA REPAIR Left 1991  . partial coloectomy     . COLON SURGERY  . COLONOSCOPY  .  CORONARY ANGIOPLASTY WITH STENT PLACEMENT 2014  . HEPATECTOMY PARTIAL Right 09/28/2015  Procedure: HEPATECTOMY PARTIAL LAPAROSCOPIC ASSISTED; Surgeon: Patsy Baltimore, MD; Location: [facility]; Service: General; Laterality: Right;  . HERNIA REPAIR 1991  left inguinal  . LIVER RESECTION  . RIGHT HEMICOLECTOMY LAPAROSCOPIC N/A 05/04/2015  Procedure: RIGHT HEMICOLECTOMY LAPAROSCOPIC; Surgeon: Marland Kitchen, MD; Location: [facility] MAIN OR; Service: General; Laterality: N/A;  . UPPER GASTROINTESTINAL ENDOSCOPY            Family History  Problem Relation Age of Onset  . Hypertension Mother   . Heart disease Mother   . Hypertension Father   . Heart disease Father   . Neuropathy Neg Hx     Social History        Social History  . Marital status: Married    Spouse name: [name]  . Number of children: 2  . Years of education: Bachelor       Occupational History  . Minister        Social History Main Topics  . Smoking status: Never Smoker  . Smokeless tobacco: Never Used  . Alcohol use No  . Drug use: No  . Sexual activity: Not on file       Other Topics Concern  . Not on file      Social History Narrative   Live at home with wife.   Caffeine: very little soda, occasional tea. 12oz-16oz/per    History  Smoking Status  . Never Smoker  Smokeless Tobacco  . Never Used         History  Alcohol Use No         Allergies  Allergen Reactions  . No Known Allergies     No current facility-administered medications for this encounter.       Review of Systems:               Cardiac Review of Systems: Y or N             Chest Pain [  n  ]         Resting SOB [n   ]      Exertional SOB  [n  ]   Orthopnea Florencio.Farrier  ]             Pedal Edema [  n ]      Palpitations [ n ]          Syncope  [ n]              Presyncope [   n]             General Review of Systems: [Y] = yes [  ]=no Constitional: recent weight change n[  ];  Wt loss over the last 3 months [   ] anorexia [  ]; fatigue [  ]; nausea [  ]; night sweats [  ]; fever [  ]; or chills [  ];          Dental: poor dentition[  ]; Last Dentist visit:              Eye : blurred vision [  ]; diplopia [   ]; vision changes [  ];  Amaurosis fugax[  ]; Resp: cough [n  ];  wheezing[ n ];  hemoptysis[ n ]; shortness of breath[ n ]; paroxysmal nocturnal dyspnea[ n ]; dyspnea on exertion[ n ]; or orthopnea[  ];  GI:  gallstones[  ],  vomiting[  ];  dysphagia[  ]; melena[  ];  hematochezia [  ]; heartburn[  ];   Hx of  Colonoscopy[  ]; GU: kidney stones [  ]; hematuria[  ];   dysuria [  ];  nocturia[  ];  history of     obstruction [  ]; urinary frequency [  ]             Skin: rash, swelling[  ];, hair loss[  ];  peripheral edema[  ];  or itching[  ]; Musculosketetal: myalgias[  ];  joint swelling[  ];  joint erythema[  ];  joint pain[  ];  back pain[  ];             Heme/Lymph: bruising[n  ];  bleeding[  ];  anemia[  ];  Neuro: TIA[  n];  headaches[  ];  stroke[  ];  vertigo[  ];  seizures[n  ];   paresthesias[  ];  difficulty walkingn[  ];             Psych:depression[  ]; anxiety[  ];             Endocrine: diabetes[y  ];  thyroid dysfunction[ n ];             Immunizations: Flu up to date [  ]; Pneumococcal up to date [  ];             Other:  Physical Exam: BP (!) 142/66   Pulse 65   Temp 98.2 F (36.8  C)   Resp 20   Wt 200 lb (90.7 kg)   SpO2 100%   BMI 25.00 kg/m   PHYSICAL EXAMINATION: General appearance: alert, cooperative and no distress Head: Normocephalic, without obvious abnormality, atraumatic Neck: no adenopathy, no carotid bruit, no JVD, supple, symmetrical, trachea midline and thyroid not enlarged, symmetric, no tenderness/mass/nodules Lymph nodes: Cervical, supraclavicular, and axillary nodes normal. Resp: clear to auscultation bilaterally Back: symmetric, no curvature. ROM normal. No CVA tenderness. Cardio: regular rate and rhythm, S1, S2 normal, no murmur, click, rub or gallop GI: soft, non-tender; bowel sounds normal; no masses,  no organomegaly and Patient has well healed mid abdominal incision and multiple port sites Extremities: extremities normal, atraumatic, no cyanosis or edema Neurologic: Grossly normal  Diagnostic Studies & Laboratory data:     Recent Radiology Findings:  Dg Chest 2 View  Result Date: 11/27/2016 CLINICAL DATA:  Pre admission prior to lung biopsy EXAM: CHEST  2 VIEW COMPARISON:  PET-CT November 19, 2016 FINDINGS: The heart size and mediastinal contours are within normal limits. Both lungs are clear. The PET-CT noted a hypermetabolic nodule in the left lung is not seen on the chest x-ray. Degenerative joint changes of the spine are noted. IMPRESSION: No active cardiopulmonary disease. Electronically Signed   By: Abelardo Diesel M.D.   On: 11/27/2016 14:08   Ct Chest Wo Contrast  Result Date: 11/27/2016 CLINICAL DATA:  Known left upper lobe mass lesion. EXAM: CT CHEST WITHOUT CONTRAST TECHNIQUE: Multidetector CT imaging of the chest was performed following the standard protocol without IV contrast. COMPARISON:  11/08/2016, 11/19/2016 FINDINGS: Cardiovascular: Somewhat limited with the lack of IV contrast. Aortic calcifications are seen without aneurysmal dilatation. Coronary calcifications are noted. Mediastinum/Nodes: The thoracic inlet is  within normal limits. There is again noted a pericardial lymph node best seen on image number 104 series 3 which measures 17 by 10 mm in greatest dimension. This is stable from the recent  PET-CT. Few small lymph nodes are noted adjacent to the carina and in the precarinal region which are stable from recent CT. No hilar adenopathy is noted. Lungs/Pleura: The lungs are again well aerated without focal infiltrate or sizable effusion. A left upper lobe nodule is again identified measuring 1 cm in greatest dimension. This corresponds to hypermetabolic lesions seen on recent PET-CT. No other focal nodules are noted. Upper Abdomen: The hypodense lesion in the anterior aspect of the liver is again visualized but less well so due to the lack of IV contrast. The remainder the upper abdomen appears within normal limits. Musculoskeletal: Degenerative changes of the thoracic spine are noted. IMPRESSION: Stable mediastinal and pericardial lymph nodes. Stable 1 cm left upper lobe nodule. Stable but less well visualized hypodense lesion within the liver. No new focal abnormality is seen. Electronically Signed   By: Inez Catalina M.D.   On: 11/27/2016 15:03    Nm Pet Image Restag (ps) Skull Base To Thigh  Result Date: 11/19/2016 CLINICAL DATA:  Initial treatment strategy for colon cancer. EXAM: NUCLEAR MEDICINE PET SKULL BASE TO THIGH TECHNIQUE: 9.7 mCi F-18 FDG was injected intravenously. Full-ring PET imaging was performed from the skull base to thigh after the radiotracer. CT data was obtained and used for attenuation correction and anatomic localization. FASTING BLOOD GLUCOSE:  Value: 147 mg/dl COMPARISON:  CT chest 11/08/2016, MR abdomen 11/08/2016 and 07/09/2016 and CT chest abdomen pelvis 05/07/2016. FINDINGS: NECK No hypermetabolic lymph nodes in the neck. There are air-fluid levels in the maxillary sinuses with scattered opacification of ethmoid air cells. CHEST Low left paratracheal lymph node measures 8 mm (CT  image 68) with an SUV max of 4.1. Infrahilar lingular nodule measures 11 mm (CT image 40) with an SUV max of 3.3, new from 05/07/2016. Pre pericardiac lymph node measures 7 mm (CT image 100), with an SUV max 4.3, new from 05/07/2016. No additional hypermetabolic mediastinal, hilar or axillary lymph nodes. CT images show atherosclerotic calcification of the arterial vasculature, including coronary arteries. No pericardial or pleural effusion. ABDOMEN/PELVIS There is hypermetabolism associated with a hypo attenuating lesion in the dome of the liver which measures roughly 2.0 cm (CT image 96) with SUV max of 6.4 (compared to background liver activity of 4.8). No abnormal hypermetabolism in the adrenal glands, spleen or pancreas. No hypermetabolic lymph nodes. Tiny stones are seen in the gallbladder. Adrenal glands, kidneys, spleen, pancreas, stomach and bowel are grossly unremarkable. There are postoperative changes of right hemicolectomy. SKELETON No abnormal osseous hypermetabolism. IMPRESSION: 1. Hypermetabolic left upper lobe nodule and pre pericardiac lymph node, new from 05/07/2016 and indicative of metastatic disease. 2. Hypermetabolism associated with a hypoattenuating lesion in the dome of the liver, also worrisome for metastatic disease. 3. Aortic atherosclerosis (ICD10-170.0). Coronary artery calcification. 4. Air-fluid levels in the maxillary sinuses with scattered opacification of the ethmoid air cells. 5. Cholelithiasis. Electronically Signed   By: Lorin Picket M.D.   On: 11/19/2016 09:26    Ct Chest W Contrast  Result Date: 11/09/2016 CLINICAL DATA:  Current history of colon cancer. EXAM: CT CHEST WITH CONTRAST TECHNIQUE: Multidetector CT imaging of the chest was performed during intravenous contrast administration. CONTRAST:  29mL ISOVUE-300 IOPAMIDOL (ISOVUE-300) INJECTION 61% COMPARISON:  CT scan of May 07, 2016. FINDINGS: Cardiovascular: Atherosclerosis of thoracic aorta is noted without  aneurysm or dissection. Coronary artery calcifications are noted. Mediastinum/Nodes: No significant mediastinal mass or adenopathy is noted. Lungs/Pleura: No pneumothorax or pleural effusion is noted. Stable mild bibasilar scarring  is noted. New 9 mm nodule is noted in left perihilar region in left upper lobe best seen on image number 82 of series 5. Upper Abdomen: Stable 2.2 cm low density is seen in dome of right hepatic lobe. Musculoskeletal: No significant osseous abnormality is noted. IMPRESSION: Aortic atherosclerosis. Coronary artery calcifications are noted suggesting coronary artery disease. Stable 2.2 cm low density seen in right hepatic lobe concerning for possible metastatic disease. New 9 mm nodule seen in left upper lobe concerning for metastatic disease. These results will be called to the ordering clinician or representative by the Radiologist Assistant, and communication documented in the PACS or zVision Dashboard. Electronically Signed   By: Marijo Conception, M.D.   On: 11/09/2016 08:43   Mr Abdomen Wwo Contrast  Result Date: 11/09/2016 CLINICAL DATA:  Colon carcinoma. Follow-up indeterminate hepatic lesion seen on previous exams. EXAM: MRI ABDOMEN WITHOUT AND WITH CONTRAST TECHNIQUE: Multiplanar multisequence MR imaging of the abdomen was performed both before and after the administration of intravenous contrast. CONTRAST:  63mL MULTIHANCE GADOBENATE DIMEGLUMINE 529 MG/ML IV SOLN COMPARISON:  MRI on 07/09/2016 and CT on 05/07/2016 FINDINGS: Lower chest: No acute findings. Hepatobiliary: A well-circumscribed hypovascular lesion is again seen in the anterior liver dome near the junction of the right and left hepatic lobes. This measures 1.6 x 2.6 cm on image 38/1502, and is not significantly changed in size compared to prior exams. This shows mild internal enhancement and peripheral rim enhancement, but shows near T2 isointensity to normal hepatic parenchyma. This has nonspecific  characteristics. Although interval stability suggests a benign etiology, liver metastasis cannot be excluded. No other liver masses are identified. Pancreas: No mass or inflammatory changes. No evidence of pancreatic ductal dilatation. Incidental note is made of pancreas divisum. Spleen:  Within normal limits in size and appearance. Adrenals/Urinary Tract: No masses identified. Stable tiny benign right renal cyst. No evidence of hydronephrosis. Stomach/Bowel: Visualized portions within the abdomen are unremarkable. Vascular/Lymphatic: No pathologically enlarged lymph nodes identified. No abdominal aortic aneurysm demonstrated. Other:  None. Musculoskeletal:  No suspicious bone lesions identified. IMPRESSION: Stable 2.6 cm hypovascular lesion in the anterior liver dome, which has indeterminate characteristics. Although this remains stable compared to recent studies, liver metastasis cannot definitely be excluded. This is in a difficult location to perform percutaneous biopsy ; consider continued followup by MRI in 6 months. No other liver lesions or evidence of metastatic disease within the abdomen. Electronically Signed   By: Earle Gell M.D.   On: 11/09/2016 10:14     I have independently reviewed the above radiologic studies.  Recent Lab Findings: Recent Labs  Lab Results  Component Value Date   WBC 8.5 11/27/2016   HGB 14.9 11/27/2016   HCT 44.2 11/27/2016   PLT 210 11/27/2016   GLUCOSE 116 (H) 11/27/2016   CHOL 150 10/23/2016   TRIG 211 (H) 10/23/2016   HDL 42 10/23/2016   LDLCALC 66 10/23/2016   ALT 32 11/27/2016   AST 24 11/27/2016   NA 137 11/27/2016   K 3.9 11/27/2016   CL 105 11/27/2016   CREATININE 0.73 11/27/2016   BUN 12 11/27/2016   CO2 24 11/27/2016   TSH 1.770 03/07/2015   INR 0.94 11/27/2016   HGBA1C 6.4 (H) 03/07/2015        Assessment / Plan:     1/Stage IV carcinoma the colon, following colon resection and liver resection now with 9 mm  nodule in the lingula medially Suspicious for metastatic disease .  2/Stable 2.6 cm hypovascular lesion in the anterior liver dome, which has indeterminate characteristics.- This may be persistent scarred area of the liver   from the previous resection.  3/history of previous anterior myocardial infarction treated acutely with stenting of the LAD 2014 currently on aspirin and Plavix   On review of the patient's current scans and previous history, the lesion in the lingula/ left lung is most likely metastatic colon cancer. I reviewed the  Will  proceed with navigation bronchoscopy EBUSD  and attempted biopsy of the left lung mass. I discussed with the patient treatment options if this was a metastatic deposit including possible stereotactic radiotherapy or lingulectomy. Patient's scans and history but presented at the multidisciplinary thoracic oncology conference also.   The goals risks and alternatives of the planned surgical procedure Bronch, navigation and ebus with biopsy   have been discussed with the patient in detail. The risks of the procedure including death, infection, stroke, myocardial infarction, bleeding, blood transfusion have all been discussed specifically.  I have quoted Allen Glenn a 1 % of perioperative mortality and a complication rate as high as 10 %. The patient's questions have been answered.Allen Glenn is willing  to proceed with the planned procedure.   [name] MD      [address] [place] [address] Office [phone]                Beeper [phone]  11/28/2016 6:44 AM

## 2016-11-28 NOTE — Progress Notes (Signed)
[address]       [place] [address]             [phone]                 Day of Surgery Procedure(s) (LRB): VIDEO BRONCHOSCOPY WITH ENDOBRONCHIAL NAVIGATION (N/A) VIDEO BRONCHOSCOPY WITH ENDOBRONCHIAL ULTRASOUND (N/A) LUNG BIOPSY, left upper lobe (Left)  LOS: 0 days   Subjective: Patient stable after bronch ebus, navigation bronchoscopy and bx, PTX noted on xray  Objective: Vital signs in last 24 hours: Patient Vitals for the past 24 hrs:  BP Temp Pulse Resp SpO2 Weight  11/28/16 1244 - - 68 14 96 % -  11/28/16 1232 - - 79 16 97 % -  11/28/16 1215 - - 78 16 98 % -  11/28/16 1145 - - 73 16 97 % -  11/28/16 1115 140/77 - 77 16 98 % -  11/28/16 1100 (!) 123/91 98 F (36.7 C) 71 12 97 % -  11/28/16 1045 129/72 - 68 15 98 % -  11/28/16 1031 124/65 97.6 F (36.4 C) - - - -  11/28/16 0639 - 98.2 F (36.8 C) - - - -  11/28/16 [id number] (!) 142/66 - 65 20 100 % 200 lb (90.7 kg)    Filed Weights   11/28/16 0638  Weight: 200 lb (90.7 kg)    Hemodynamic parameters for last 24 hours:    Intake/Output from previous day: No intake/output data recorded. Intake/Output this shift: Total I/O In: 1200 [I.V.:1200] Out: 300 [Urine:300]  Scheduled Meds: Continuous Infusions: PRN Meds:.fentaNYL (SUBLIMAZE) injection, promethazine  General appearance: alert and cooperative Neurologic: intact Heart: regular rate and rhythm, S1, S2 normal, no murmur, click, rub or gallop Lungs: clear to auscultation bilaterally Abdomen: soft, non-tender; bowel sounds normal; no masses,  no organomegaly Extremities: extremities normal, atraumatic, no cyanosis or edema and Homans sign is negative, no sign of DVT  Lab Results: CBC: Recent Labs  11/27/16 1324  WBC 8.5  HGB 14.9  HCT 44.2  PLT 210   BMET:  Recent Labs  11/27/16 1324  NA 137  K 3.9  CL 105  CO2 24  GLUCOSE 116*  BUN 12  CREATININE 0.73  CALCIUM 8.5*    PT/INR:  Recent Labs  11/27/16 1324    LABPROT 12.5  INR 0.94     Radiology Dg Chest 2 View  Result Date: 11/28/2016 CLINICAL DATA:  Shortness of breath, cough. EXAM: CHEST  2 VIEW COMPARISON:  11/28/2016 FINDINGS: Left pneumothorax has enlarged somewhat since prior study. There is left base atelectasis. Right lung is clear. Heart is normal size. IMPRESSION: Enlarging left pneumothorax, now approximately 25-30%. Critical Value/emergent results were called by telephone at the time of interpretation on 11/28/2016 at 12:32 pm to Dr. Lanelle Bal , who verbally acknowledged these results. Electronically Signed   By: Rolm Baptise M.D.   On: 11/28/2016 12:36   Ct Chest Wo Contrast  Result Date: 11/27/2016 CLINICAL DATA:  Known left upper lobe mass lesion. EXAM: CT CHEST WITHOUT CONTRAST TECHNIQUE: Multidetector CT imaging of the chest was performed following the standard protocol without IV contrast. COMPARISON:  11/08/2016, 11/19/2016 FINDINGS: Cardiovascular: Somewhat limited with the lack of IV contrast. Aortic calcifications are seen without aneurysmal dilatation. Coronary calcifications are noted. Mediastinum/Nodes: The thoracic inlet is within normal limits. There is again noted a pericardial lymph node best seen on image number 104 series 3 which measures 17 by 10 mm in  greatest dimension. This is stable from the recent PET-CT. Few small lymph nodes are noted adjacent to the carina and in the precarinal region which are stable from recent CT. No hilar adenopathy is noted. Lungs/Pleura: The lungs are again well aerated without focal infiltrate or sizable effusion. A left upper lobe nodule is again identified measuring 1 cm in greatest dimension. This corresponds to hypermetabolic lesions seen on recent PET-CT. No other focal nodules are noted. Upper Abdomen: The hypodense lesion in the anterior aspect of the liver is again visualized but less well so due to the lack of IV contrast. The remainder the upper abdomen appears within normal  limits. Musculoskeletal: Degenerative changes of the thoracic spine are noted. IMPRESSION: Stable mediastinal and pericardial lymph nodes. Stable 1 cm left upper lobe nodule. Stable but less well visualized hypodense lesion within the liver. No new focal abnormality is seen. Electronically Signed   By: Inez Catalina M.D.   On: 11/27/2016 15:03   Dg Chest Port 1 View  Result Date: 11/28/2016 CLINICAL DATA:  Post bronchoscopy with biopsy. EXAM: PORTABLE CHEST 1 VIEW COMPARISON:  11/27/2016 FINDINGS: Left base opacity, likely atelectasis. Right lung is clear. Heart is normal size. There is moderate-sized left pneumothorax noted at the left base and over the left upper lobe/apex. This is approximately 15-20%. IMPRESSION: Moderate-sized left pneumothorax, 15-20%. These results will be called to the ordering clinician or representative by the Radiologist Assistant, and communication documented in the PACS or zVision Dashboard. Electronically Signed   By: Rolm Baptise M.D.   On: 11/28/2016 11:21     Assessment/Plan: S/P Procedure(s) (LRB): VIDEO BRONCHOSCOPY WITH ENDOBRONCHIAL NAVIGATION (N/A) VIDEO BRONCHOSCOPY WITH ENDOBRONCHIAL ULTRASOUND (N/A) LUNG BIOPSY, left upper lobe (Left)  Post op PTX on portable and pa lat film shows PTX , patient without symptoms  On review of films likely not much change other then position  Will keep patient overnight for follow up films observation status . No chest tube at this point     [name] MD 11/28/2016 1:03 PM

## 2016-11-28 NOTE — Anesthesia Postprocedure Evaluation (Signed)
Anesthesia Post Note  Patient: Allen Glenn  Procedure(s) Performed: Procedure(s) (LRB): VIDEO BRONCHOSCOPY WITH ENDOBRONCHIAL NAVIGATION (N/A) VIDEO BRONCHOSCOPY WITH ENDOBRONCHIAL ULTRASOUND (N/A) LUNG BIOPSY, left upper lobe (Left)  Patient location during evaluation: PACU Anesthesia Type: General Level of consciousness: awake and alert Pain management: pain level controlled Vital Signs Assessment: post-procedure vital signs reviewed and stable Respiratory status: spontaneous breathing, nonlabored ventilation, respiratory function stable and patient connected to nasal cannula oxygen Cardiovascular status: blood pressure returned to baseline and stable Postop Assessment: no signs of nausea or vomiting Anesthetic complications: no    Last Vitals:  Vitals:   11/28/16 1100 11/28/16 1115  BP: (!) 123/91 140/77  Pulse: 71 77  Resp: 12 16  Temp: 36.7 C     Last Pain: There were no vitals filed for this visit.               Kirin Brandenburger S

## 2016-11-28 NOTE — Discharge Summary (Signed)
Physician Discharge Summary       [address]       [place] [address]             [phone]    Patient ID: Allen Glenn MRN: [id number] DOB/AGE: Nov 25, 1956 60 y.o.  Admit date: 11/28/2016 Discharge date: 11/29/2016  Admission Diagnoses: 1. Left upper lobe nodule 2. Colon cancer ([place])  Active Diagnoses:  1. Acute MI 2. Anemia 3. Coronary artery disease 4. Enlarged prostate 5. Hyperlipidemia 6. Hypertension 7. Peripheral neuropathy (HCC)   Procedure (s):  VIDEO BRONCHOSCOPY WITH ENDOBRONCHIAL NAVIGATION (N/A) VIDEO BRONCHOSCOPY WITH ENDOBRONCHIAL ULTRASOUND (N/A) With lung Biopsy by Dr. Servando Snare on 11/28/2016.  Pathology: Results pending  History of Presenting Illness: Allen Glenn y.o.maleis seen in the office for a new just under 1 cm pulmonary nodule in the lingula. This area was not present on a CT scan of the chest done in May 2017. The patient was being followed by oncology after a RIGHT HEMICOLECTOMY LAPAROSCOPIC N/A 05/04/2015 and HEPATECTOMY PARTIAL Right 09/28/2015. Following his colon resection in May 2016 he underwent 4 cycles of chemotherapy . He is currently under surveillance without active treatment.   The patient also has a history of acute anterior myocardial infarction treated with a stent to the LAD in 2014. 1/Stage IV carcinoma the colon, following colon resection and liver resection now with 9 mm nodule in the lingula medially Suspicious for metastatic disease . 2/Stable 2.6 cm hypovascular lesion in the anterior liver dome, which has indeterminate characteristics.- This may be persistent scarred area of the liver from the previous resection. 3/history of previous anterior myocardial infarction treated acutely with stenting of the LAD 2014 currently on aspirin and Plavix   On review of the patient's current scans and previous history, the lesion in the lingula/ left lung is most likely metastatic colon cancer. I reviewed  the  Dr. Servando Snare recommended proceeding with navigation bronchoscopy, EBUS, and attempted biopsy of the left lung mass. He discussed with the patient treatment options if this was a metastatic deposit including possible stereotactic radiotherapy or lingulectomy. Patient's scans and history but presented at the multidisciplinary thoracic oncology conference also. He presented to [facility] on [facility] in order to undergo the aforementioned procedure.   Brief Hospital Course:  He was found to have a left pneumothorax post operatively. Chest x ray later early afternoon showed an increase in this left pneumothorax. As a result, he is going to be kept overnight for observation. We will obtain another chest x ray at 1630 today. Another chest x ray has been ordered for the morning. This was stable in appearance.  The patient remains asymptomatic, will plan to discharge home today.    Latest Vital Signs: Blood pressure (!) 124/55, pulse 81, temperature 97.7 F (36.5 C), temperature source Oral, resp. rate 18, height 6\' 2"  (1.88 m), weight 203 lb 11.3 oz (92.4 kg), SpO2 97 %.  Physical Exam: General appearance: alert and cooperative Neurologic: intact Heart: regular rate and rhythm, S1, S2 normal, no murmur, click, rub or gallop Lungs: clear to auscultation bilaterally Abdomen: soft, non-tender; bowel sounds normal; no masses,  no organomegaly Extremities: extremities normal, atraumatic, no cyanosis or edema and Homans sign is negative, no sign of DVT  Discharge Condition:Stable and discharged to home.  Recent laboratory studies:  Lab Results  Component Value Date   WBC 8.5 11/27/2016   HGB 14.9 11/27/2016   HCT 44.2 11/27/2016   MCV 89.3 11/27/2016   PLT 210  11/27/2016   Lab Results  Component Value Date   NA 137 11/27/2016   K 3.9 11/27/2016   CL 105 11/27/2016   CO2 24 11/27/2016   CREATININE 0.73 11/27/2016   GLUCOSE 116 (H) 11/27/2016      Diagnostic Studies: X-ray Chest Pa And  Lateral  Result Date: 11/28/2016 CLINICAL DATA:  Follow-up left pneumothorax. EXAM: CHEST  2 VIEW COMPARISON:  Earlier this day at 1221 hour FINDINGS: Left pneumothorax has decreased in size, approximately 15-20% currently. Basilar component is no longer visualized. Left lung base atelectasis again seen. No mediastinal shift. Right lung is clear. Normal heart size. IMPRESSION: Decreasing size of moderate left pneumothorax since exam 6 hours earlier. Electronically Signed   By: Jeb Levering M.D.   On: 11/28/2016 19:54   Dg Chest 2 View  Result Date: 11/28/2016 CLINICAL DATA:  Shortness of breath, cough. EXAM: CHEST  2 VIEW COMPARISON:  11/28/2016 FINDINGS: Left pneumothorax has enlarged somewhat since prior study. There is left base atelectasis. Right lung is clear. Heart is normal size. IMPRESSION: Enlarging left pneumothorax, now approximately 25-30%. Critical Value/emergent results were called by telephone at the time of interpretation on 11/28/2016 at 12:32 pm to Dr. Lanelle Bal , who verbally acknowledged these results. Electronically Signed   By: Rolm Baptise M.D.   On: 11/28/2016 12:36   Dg Chest 2 View  Result Date: 11/27/2016 CLINICAL DATA:  Pre admission prior to lung biopsy EXAM: CHEST  2 VIEW COMPARISON:  PET-CT November 19, 2016 FINDINGS: The heart size and mediastinal contours are within normal limits. Both lungs are clear. The PET-CT noted a hypermetabolic nodule in the left lung is not seen on the chest x-ray. Degenerative joint changes of the spine are noted. IMPRESSION: No active cardiopulmonary disease. Electronically Signed   By: Abelardo Diesel M.D.   On: 11/27/2016 14:08   Ct Chest Wo Contrast  Result Date: 11/27/2016 CLINICAL DATA:  Known left upper lobe mass lesion. EXAM: CT CHEST WITHOUT CONTRAST TECHNIQUE: Multidetector CT imaging of the chest was performed following the standard protocol without IV contrast. COMPARISON:  11/08/2016, 11/19/2016 FINDINGS: Cardiovascular:  Somewhat limited with the lack of IV contrast. Aortic calcifications are seen without aneurysmal dilatation. Coronary calcifications are noted. Mediastinum/Nodes: The thoracic inlet is within normal limits. There is again noted a pericardial lymph node best seen on image number 104 series 3 which measures 17 by 10 mm in greatest dimension. This is stable from the recent PET-CT. Few small lymph nodes are noted adjacent to the carina and in the precarinal region which are stable from recent CT. No hilar adenopathy is noted. Lungs/Pleura: The lungs are again well aerated without focal infiltrate or sizable effusion. A left upper lobe nodule is again identified measuring 1 cm in greatest dimension. This corresponds to hypermetabolic lesions seen on recent PET-CT. No other focal nodules are noted. Upper Abdomen: The hypodense lesion in the anterior aspect of the liver is again visualized but less well so due to the lack of IV contrast. The remainder the upper abdomen appears within normal limits. Musculoskeletal: Degenerative changes of the thoracic spine are noted. IMPRESSION: Stable mediastinal and pericardial lymph nodes. Stable 1 cm left upper lobe nodule. Stable but less well visualized hypodense lesion within the liver. No new focal abnormality is seen. Electronically Signed   By: Inez Catalina M.D.   On: 11/27/2016 15:03   Ct Chest W Contrast  Result Date: 11/09/2016 CLINICAL DATA:  Current history of colon cancer. EXAM: CT  CHEST WITH CONTRAST TECHNIQUE: Multidetector CT imaging of the chest was performed during intravenous contrast administration. CONTRAST:  41mL ISOVUE-300 IOPAMIDOL (ISOVUE-300) INJECTION 61% COMPARISON:  CT scan of May 07, 2016. FINDINGS: Cardiovascular: Atherosclerosis of thoracic aorta is noted without aneurysm or dissection. Coronary artery calcifications are noted. Mediastinum/Nodes: No significant mediastinal mass or adenopathy is noted. Lungs/Pleura: No pneumothorax or pleural  effusion is noted. Stable mild bibasilar scarring is noted. New 9 mm nodule is noted in left perihilar region in left upper lobe best seen on image number 82 of series 5. Upper Abdomen: Stable 2.2 cm low density is seen in dome of right hepatic lobe. Musculoskeletal: No significant osseous abnormality is noted. IMPRESSION: Aortic atherosclerosis. Coronary artery calcifications are noted suggesting coronary artery disease. Stable 2.2 cm low density seen in right hepatic lobe concerning for possible metastatic disease. New 9 mm nodule seen in left upper lobe concerning for metastatic disease. These results will be called to the ordering clinician or representative by the Radiologist Assistant, and communication documented in the PACS or zVision Dashboard. Electronically Signed   By: Marijo Conception, M.D.   On: 11/09/2016 08:43   Mr Abdomen Wwo Contrast  Result Date: 11/09/2016 CLINICAL DATA:  Colon carcinoma. Follow-up indeterminate hepatic lesion seen on previous exams. EXAM: MRI ABDOMEN WITHOUT AND WITH CONTRAST TECHNIQUE: Multiplanar multisequence MR imaging of the abdomen was performed both before and after the administration of intravenous contrast. CONTRAST:  33mL MULTIHANCE GADOBENATE DIMEGLUMINE 529 MG/ML IV SOLN COMPARISON:  MRI on 07/09/2016 and CT on 05/07/2016 FINDINGS: Lower chest: No acute findings. Hepatobiliary: A well-circumscribed hypovascular lesion is again seen in the anterior liver dome near the junction of the right and left hepatic lobes. This measures 1.6 x 2.6 cm on image 38/1502, and is not significantly changed in size compared to prior exams. This shows mild internal enhancement and peripheral rim enhancement, but shows near T2 isointensity to normal hepatic parenchyma. This has nonspecific characteristics. Although interval stability suggests a benign etiology, liver metastasis cannot be excluded. No other liver masses are identified. Pancreas: No mass or inflammatory changes. No  evidence of pancreatic ductal dilatation. Incidental note is made of pancreas divisum. Spleen:  Within normal limits in size and appearance. Adrenals/Urinary Tract: No masses identified. Stable tiny benign right renal cyst. No evidence of hydronephrosis. Stomach/Bowel: Visualized portions within the abdomen are unremarkable. Vascular/Lymphatic: No pathologically enlarged lymph nodes identified. No abdominal aortic aneurysm demonstrated. Other:  None. Musculoskeletal:  No suspicious bone lesions identified. IMPRESSION: Stable 2.6 cm hypovascular lesion in the anterior liver dome, which has indeterminate characteristics. Although this remains stable compared to recent studies, liver metastasis cannot definitely be excluded. This is in a difficult location to perform percutaneous biopsy ; consider continued followup by MRI in 6 months. No other liver lesions or evidence of metastatic disease within the abdomen. Electronically Signed   By: Earle Gell M.D.   On: 11/09/2016 10:14   Nm Pet Image Restag (ps) Skull Base To Thigh  Result Date: 11/19/2016 CLINICAL DATA:  Initial treatment strategy for colon cancer. EXAM: NUCLEAR MEDICINE PET SKULL BASE TO THIGH TECHNIQUE: 9.7 mCi F-18 FDG was injected intravenously. Full-ring PET imaging was performed from the skull base to thigh after the radiotracer. CT data was obtained and used for attenuation correction and anatomic localization. FASTING BLOOD GLUCOSE:  Value: 147 mg/dl COMPARISON:  CT chest 11/08/2016, MR abdomen 11/08/2016 and 07/09/2016 and CT chest abdomen pelvis 05/07/2016. FINDINGS: NECK No hypermetabolic lymph nodes in the neck.  There are air-fluid levels in the maxillary sinuses with scattered opacification of ethmoid air cells. CHEST Low left paratracheal lymph node measures 8 mm (CT image 68) with an SUV max of 4.1. Infrahilar lingular nodule measures 11 mm (CT image 40) with an SUV max of 3.3, new from 05/07/2016. Pre pericardiac lymph node measures 7 mm  (CT image 100), with an SUV max 4.3, new from 05/07/2016. No additional hypermetabolic mediastinal, hilar or axillary lymph nodes. CT images show atherosclerotic calcification of the arterial vasculature, including coronary arteries. No pericardial or pleural effusion. ABDOMEN/PELVIS There is hypermetabolism associated with a hypo attenuating lesion in the dome of the liver which measures roughly 2.0 cm (CT image 96) with SUV max of 6.4 (compared to background liver activity of 4.8). No abnormal hypermetabolism in the adrenal glands, spleen or pancreas. No hypermetabolic lymph nodes. Tiny stones are seen in the gallbladder. Adrenal glands, kidneys, spleen, pancreas, stomach and bowel are grossly unremarkable. There are postoperative changes of right hemicolectomy. SKELETON No abnormal osseous hypermetabolism. IMPRESSION: 1. Hypermetabolic left upper lobe nodule and pre pericardiac lymph node, new from 05/07/2016 and indicative of metastatic disease. 2. Hypermetabolism associated with a hypoattenuating lesion in the dome of the liver, also worrisome for metastatic disease. 3. Aortic atherosclerosis (ICD10-170.0). Coronary artery calcification. 4. Air-fluid levels in the maxillary sinuses with scattered opacification of the ethmoid air cells. 5. Cholelithiasis. Electronically Signed   By: Lorin Picket M.D.   On: 11/19/2016 09:26   Dg Chest Port 1 View  Result Date: 11/28/2016 CLINICAL DATA:  Post bronchoscopy with biopsy. EXAM: PORTABLE CHEST 1 VIEW COMPARISON:  11/27/2016 FINDINGS: Left base opacity, likely atelectasis. Right lung is clear. Heart is normal size. There is moderate-sized left pneumothorax noted at the left base and over the left upper lobe/apex. This is approximately 15-20%. IMPRESSION: Moderate-sized left pneumothorax, 15-20%. These results will be called to the ordering clinician or representative by the Radiologist Assistant, and communication documented in the PACS or zVision Dashboard.  Electronically Signed   By: Rolm Baptise M.D.   On: 11/28/2016 11:21    Discharge Medications:   Medication List    TAKE these medications   acetaminophen 325 MG tablet Commonly known as:  TYLENOL Take 2 tablets (650 mg total) by mouth every 6 (six) hours as needed for mild pain (or Fever >/= 101).   amoxicillin 875 MG tablet Commonly known as:  AMOXIL Take 875 mg by mouth 2 (two) times daily. Has 7 days left to take given for sinus infection   aspirin EC 81 MG tablet Take 81 mg by mouth daily.   atorvastatin 10 MG tablet Commonly known as:  LIPITOR Take 10 mg by mouth daily at 6 PM. What changed:  Another medication with the same name was removed. Continue taking this medication, and follow the directions you see here.   CALCIUM MAGNESIUM PO Take 1 tablet by mouth daily.   clopidogrel 75 MG tablet Commonly known as:  PLAVIX Take 75 mg by mouth daily.   lisinopril 2.5 MG tablet Commonly known as:  PRINIVIL,ZESTRIL Take 2.5 mg by mouth daily.   loratadine 10 MG tablet Commonly known as:  CLARITIN Take 10 mg by mouth daily as needed for allergies or rhinitis.   MUCINEX PO Take 2 tablets by mouth 2 (two) times daily as needed (congestion).   multivitamin capsule Take 1 capsule by mouth.   nitroGLYCERIN 0.4 MG SL tablet Commonly known as:  NITROSTAT Place 0.4 mg under the tongue every  5 (five) minutes as needed for chest pain. CALL 911 IF PAIN PERSISTS   tamsulosin 0.4 MG Caps capsule Commonly known as:  FLOMAX Take 0.4 mg by mouth daily.   vitamin C 1000 MG tablet Take 1,000 mg by mouth daily.   Zinc 50 MG Caps Take 50 mg by mouth daily.       Follow Up Appointments: Follow-up Information    [name], MD. Schedule an appointment as soon as possible for a visit on 12/06/2016.   Specialty:  Cardiothoracic Surgery Why:  Appointment is at 9:00 Contact information: [address] [place] [address] [phone]            Signed: Cinda Quest 11/29/2016, 7:51 AM

## 2016-11-28 NOTE — Progress Notes (Signed)
Received patient from PACU. Patient placed on telemetry. CCMD notified. Patient oriented to room and equipment. Call bell in place. RN will continue to monitor. Cyndia Bent RN

## 2016-11-28 NOTE — Progress Notes (Signed)
[address]       [place] [address]             [phone]                    Karey Gosch [place] Medical Record [id number] Date of Birth: [birth year]  Referring: No ref. provider found Primary Care: [name], MD Oncologist: Dr  Rushie Nyhan Chief Complaint:    Lung mass  History of Present Illness:    Allen Glenn 60 y.o. male is seen in the office for a new just under 1 cm pulmonary nodule in the lingula. This area was not present on a CT scan of the chest done in May 2017. The patient was being followed by oncology after a RIGHT HEMICOLECTOMY LAPAROSCOPIC N/A 05/04/2015  and HEPATECTOMY PARTIAL Right 09/28/2015.  Following his colon resection in May 2016 he underwent 4 cycles of chemotherapy . He is currently under surveillance without active treatment.   The patient also has a history of acute anterior myocardial infarction treated with a stent to the LAD in 2014.  Current Activity/ Functional Status:  Patient is independent with mobility/ambulation, transfers, ADL's, IADL's.   Zubrod Score: At the time of surgery this patient's most appropriate activity status/level should be described as: [x]     0    Normal activity, no symptoms []     1    Restricted in physical strenuous activity but ambulatory, able to do out light work []     2    Ambulatory and capable of self care, unable to do work activities, up and about               >50 % of waking hours                              []     3    Only limited self care, in bed greater than 50% of waking hours []     4    Completely disabled, no self care, confined to bed or chair []     5    Moribund   Past Medical History:  Diagnosis Date  . Acute MI 2014   acute ST elevation MI  . Anemia   . Colon cancer ([place]) 06/13/2016   Status post resection of colon mass is well as he panic metastasis.   . Coronary artery disease   . Enlarged prostate    slightly and takes Flomax daily  . Heart disease   .  History of blood transfusion   . Hyperlipidemia    takes Lipitor daily  . Hypertension    takes Lisinopril daily  . Lung nodule    left  . Nocturia   . Numbness    left foot  . Peripheral neuropathy [facility])     Past Surgical History:  Procedure Laterality Date  . 1/8 of liver removed    . APPENDECTOMY    . COLONOSCOPY    . CORONARY ANGIOPLASTY     1 stent  . CORONARY STENT PLACEMENT  06-27-2013  . HERNIA REPAIR Left 1991  . partial coloectomy     . COLON SURGERY  . COLONOSCOPY  . CORONARY ANGIOPLASTY WITH STENT PLACEMENT 2014  . HEPATECTOMY PARTIAL Right 09/28/2015  Procedure: HEPATECTOMY PARTIAL LAPAROSCOPIC ASSISTED; Surgeon: Patsy Baltimore, MD; Location: [place]; Service: General; Laterality: Right;  . [place]  left  inguinal  . LIVER RESECTION  . RIGHT HEMICOLECTOMY LAPAROSCOPIC N/A 05/04/2015  Procedure: RIGHT HEMICOLECTOMY LAPAROSCOPIC; Surgeon: Marland Kitchen, MD; Location[facility] MAIN OR; Service: General; Laterality: N/A;  . UPPER GASTROINTESTINAL ENDOSCOPY       Family History  Problem Relation Age of Onset  . Hypertension Mother   . Heart disease Mother   . Hypertension Father   . Heart disease Father   . Neuropathy Neg Hx     Social History   Social History  . Marital status: Married    Spouse name: [name]  . Number of children: 2  . Years of education: Bachelor   Occupational History  . Minister    Social History Main Topics  . Smoking status: Never Smoker  . Smokeless tobacco: Never Used  . Alcohol use No  . Drug use: No  . Sexual activity: Not on file   Other Topics Concern  . Not on file   Social History Narrative   Live at home with wife.   Caffeine: very little soda, occasional tea. 12oz-16oz/per    History  Smoking Status  . Never Smoker  Smokeless Tobacco  . Never Used    History  Alcohol Use No     Allergies  Allergen Reactions  . No Known Allergies     No current facility-administered  medications for this encounter.       Review of Systems:     Cardiac Review of Systems: Y or N  Chest Pain [  n  ]  Resting SOB [n   ] Exertional SOB  [n  ]  Orthopnea Florencio.[facility]  ]   Pedal Edema [  n ]    Palpitations [ n ] Syncope  [ n]   Presyncope [   n]  General Review of Systems: [Y] = yes [  ]=no Constitional: recent weight change n[  ];  Wt loss over the last 3 months [   ] anorexia [  ]; fatigue [  ]; nausea [  ]; night sweats [  ]; fever [  ]; or chills [  ];          Dental: poor dentition[  ]; Last Dentist visit:   Eye : blurred vision [  ]; diplopia [   ]; vision changes [  ];  Amaurosis fugax[  ]; Resp: cough [n  ];  wheezing[ n ];  hemoptysis[ n ]; shortness of breath[ n ]; paroxysmal nocturnal dyspnea[ n ]; dyspnea on exertion[ n ]; or orthopnea[  ];  GI:  gallstones[  ], vomiting[  ];  dysphagia[  ]; melena[  ];  hematochezia [  ]; heartburn[  ];   Hx of  Colonoscopy[  ]; GU: kidney stones [  ]; hematuria[  ];   dysuria [  ];  nocturia[  ];  history of     obstruction [  ]; urinary frequency [  ]             Skin: rash, swelling[  ];, hair loss[  ];  peripheral edema[  ];  or itching[  ]; Musculosketetal: myalgias[  ];  joint swelling[  ];  joint erythema[  ];  joint pain[  ];  back pain[  ];  Heme/Lymph: bruising[n  ];  bleeding[  ];  anemia[  ];  Neuro: TIA[  n];  headaches[  ];  stroke[  ];  vertigo[  ];  seizures[n  ];   paresthesias[  ];  difficulty walkingn[  ];  Psych:depression[  ]; anxiety[  ];  Endocrine: diabetes[y  ];  thyroid dysfunction[ n ];  Immunizations: Flu up to date [  ]; Pneumococcal up to date [  ];  Other:  Physical Exam: BP (!) 142/66   Pulse 65   Temp 98.2 F (36.8 C)   Resp 20   Wt 200 lb (90.7 kg)   SpO2 100%   BMI 25.00 kg/m   PHYSICAL EXAMINATION: General appearance: alert, cooperative and no distress Head: Normocephalic, without obvious abnormality, atraumatic Neck: no adenopathy, no carotid bruit, no JVD, supple, symmetrical, trachea  midline and thyroid not enlarged, symmetric, no tenderness/mass/nodules Lymph nodes: Cervical, supraclavicular, and axillary nodes normal. Resp: clear to auscultation bilaterally Back: symmetric, no curvature. ROM normal. No CVA tenderness. Cardio: regular rate and rhythm, S1, S2 normal, no murmur, click, rub or gallop GI: soft, non-tender; bowel sounds normal; no masses,  no organomegaly and Patient has well healed mid abdominal incision and multiple port sites Extremities: extremities normal, atraumatic, no cyanosis or edema Neurologic: Grossly normal  Diagnostic Studies & Laboratory data:     Recent Radiology Findings:  Dg Chest 2 View  Result Date: 11/27/2016 CLINICAL DATA:  Pre admission prior to lung biopsy EXAM: CHEST  2 VIEW COMPARISON:  PET-CT November 19, 2016 FINDINGS: The heart size and mediastinal contours are within normal limits. Both lungs are clear. The PET-CT noted a hypermetabolic nodule in the left lung is not seen on the chest x-ray. Degenerative joint changes of the spine are noted. IMPRESSION: No active cardiopulmonary disease. Electronically Signed   By: Abelardo Diesel M.D.   On: 11/27/2016 14:08   Ct Chest Wo Contrast  Result Date: 11/27/2016 CLINICAL DATA:  Known left upper lobe mass lesion. EXAM: CT CHEST WITHOUT CONTRAST TECHNIQUE: Multidetector CT imaging of the chest was performed following the standard protocol without IV contrast. COMPARISON:  11/08/2016, 11/19/2016 FINDINGS: Cardiovascular: Somewhat limited with the lack of IV contrast. Aortic calcifications are seen without aneurysmal dilatation. Coronary calcifications are noted. Mediastinum/Nodes: The thoracic inlet is within normal limits. There is again noted a pericardial lymph node best seen on image number 104 series 3 which measures 17 by 10 mm in greatest dimension. This is stable from the recent PET-CT. Few small lymph nodes are noted adjacent to the carina and in the precarinal region which are stable  from recent CT. No hilar adenopathy is noted. Lungs/Pleura: The lungs are again well aerated without focal infiltrate or sizable effusion. A left upper lobe nodule is again identified measuring 1 cm in greatest dimension. This corresponds to hypermetabolic lesions seen on recent PET-CT. No other focal nodules are noted. Upper Abdomen: The hypodense lesion in the anterior aspect of the liver is again visualized but less well so due to the lack of IV contrast. The remainder the upper abdomen appears within normal limits. Musculoskeletal: Degenerative changes of the thoracic spine are noted. IMPRESSION: Stable mediastinal and pericardial lymph nodes. Stable 1 cm left upper lobe nodule. Stable but less well visualized hypodense lesion within the liver. No new focal abnormality is seen. Electronically Signed   By: Inez Catalina M.D.   On: 11/27/2016 15:03    Nm Pet Image Restag (ps) Skull Base To Thigh  Result Date: 11/19/2016 CLINICAL DATA:  Initial treatment strategy for colon cancer. EXAM: NUCLEAR MEDICINE PET SKULL BASE TO THIGH TECHNIQUE: 9.7 mCi F-18 FDG was injected intravenously. Full-ring PET imaging was performed from the skull base to thigh after the radiotracer. CT data was obtained  and used for attenuation correction and anatomic localization. FASTING BLOOD GLUCOSE:  Value: 147 mg/dl COMPARISON:  CT chest 11/08/2016, MR abdomen 11/08/2016 and 07/09/2016 and CT chest abdomen pelvis 05/07/2016. FINDINGS: NECK No hypermetabolic lymph nodes in the neck. There are air-fluid levels in the maxillary sinuses with scattered opacification of ethmoid air cells. CHEST Low left paratracheal lymph node measures 8 mm (CT image 68) with an SUV max of 4.1. Infrahilar lingular nodule measures 11 mm (CT image 40) with an SUV max of 3.3, new from 05/07/2016. Pre pericardiac lymph node measures 7 mm (CT image 100), with an SUV max 4.3, new from 05/07/2016. No additional hypermetabolic mediastinal, hilar or axillary lymph  nodes. CT images show atherosclerotic calcification of the arterial vasculature, including coronary arteries. No pericardial or pleural effusion. ABDOMEN/PELVIS There is hypermetabolism associated with a hypo attenuating lesion in the dome of the liver which measures roughly 2.0 cm (CT image 96) with SUV max of 6.4 (compared to background liver activity of 4.8). No abnormal hypermetabolism in the adrenal glands, spleen or pancreas. No hypermetabolic lymph nodes. Tiny stones are seen in the gallbladder. Adrenal glands, kidneys, spleen, pancreas, stomach and bowel are grossly unremarkable. There are postoperative changes of right hemicolectomy. SKELETON No abnormal osseous hypermetabolism. IMPRESSION: 1. Hypermetabolic left upper lobe nodule and pre pericardiac lymph node, new from 05/07/2016 and indicative of metastatic disease. 2. Hypermetabolism associated with a hypoattenuating lesion in the dome of the liver, also worrisome for metastatic disease. 3. Aortic atherosclerosis (ICD10-170.0). Coronary artery calcification. 4. Air-fluid levels in the maxillary sinuses with scattered opacification of the ethmoid air cells. 5. Cholelithiasis. Electronically Signed   By: Lorin Picket M.D.   On: 11/19/2016 09:26    Ct Chest W Contrast  Result Date: 11/09/2016 CLINICAL DATA:  Current history of colon cancer. EXAM: CT CHEST WITH CONTRAST TECHNIQUE: Multidetector CT imaging of the chest was performed during intravenous contrast administration. CONTRAST:  82mL ISOVUE-300 IOPAMIDOL (ISOVUE-300) INJECTION 61% COMPARISON:  CT scan of May 07, 2016. FINDINGS: Cardiovascular: Atherosclerosis of thoracic aorta is noted without aneurysm or dissection. Coronary artery calcifications are noted. Mediastinum/Nodes: No significant mediastinal mass or adenopathy is noted. Lungs/Pleura: No pneumothorax or pleural effusion is noted. Stable mild bibasilar scarring is noted. New 9 mm nodule is noted in left perihilar region in left  upper lobe best seen on image number 82 of series 5. Upper Abdomen: Stable 2.2 cm low density is seen in dome of right hepatic lobe. Musculoskeletal: No significant osseous abnormality is noted. IMPRESSION: Aortic atherosclerosis. Coronary artery calcifications are noted suggesting coronary artery disease. Stable 2.2 cm low density seen in right hepatic lobe concerning for possible metastatic disease. New 9 mm nodule seen in left upper lobe concerning for metastatic disease. These results will be called to the ordering clinician or representative by the Radiologist Assistant, and communication documented in the PACS or zVision Dashboard. Electronically Signed   By: Marijo Conception, M.D.   On: 11/09/2016 08:43   Mr Abdomen Wwo Contrast  Result Date: 11/09/2016 CLINICAL DATA:  Colon carcinoma. Follow-up indeterminate hepatic lesion seen on previous exams. EXAM: MRI ABDOMEN WITHOUT AND WITH CONTRAST TECHNIQUE: Multiplanar multisequence MR imaging of the abdomen was performed both before and after the administration of intravenous contrast. CONTRAST:  24mL MULTIHANCE GADOBENATE DIMEGLUMINE 529 MG/ML IV SOLN COMPARISON:  MRI on 07/09/2016 and CT on 05/07/2016 FINDINGS: Lower chest: No acute findings. Hepatobiliary: A well-circumscribed hypovascular lesion is again seen in the anterior liver dome near the junction  of the right and left hepatic lobes. This measures 1.6 x 2.6 cm on image 38/1502, and is not significantly changed in size compared to prior exams. This shows mild internal enhancement and peripheral rim enhancement, but shows near T2 isointensity to normal hepatic parenchyma. This has nonspecific characteristics. Although interval stability suggests a benign etiology, liver metastasis cannot be excluded. No other liver masses are identified. Pancreas: No mass or inflammatory changes. No evidence of pancreatic ductal dilatation. Incidental note is made of pancreas divisum. Spleen:  Within normal limits in  size and appearance. Adrenals/Urinary Tract: No masses identified. Stable tiny benign right renal cyst. No evidence of hydronephrosis. Stomach/Bowel: Visualized portions within the abdomen are unremarkable. Vascular/Lymphatic: No pathologically enlarged lymph nodes identified. No abdominal aortic aneurysm demonstrated. Other:  None. Musculoskeletal:  No suspicious bone lesions identified. IMPRESSION: Stable 2.6 cm hypovascular lesion in the anterior liver dome, which has indeterminate characteristics. Although this remains stable compared to recent studies, liver metastasis cannot definitely be excluded. This is in a difficult location to perform percutaneous biopsy ; consider continued followup by MRI in 6 months. No other liver lesions or evidence of metastatic disease within the abdomen. Electronically Signed   By: Earle Gell M.D.   On: 11/09/2016 10:14     I have independently reviewed the above radiologic studies.  Recent Lab Findings: Lab Results  Component Value Date   WBC 8.5 11/27/2016   HGB 14.9 11/27/2016   HCT 44.2 11/27/2016   PLT 210 11/27/2016   GLUCOSE 116 (H) 11/27/2016   CHOL 150 10/23/2016   TRIG 211 (H) 10/23/2016   HDL 42 10/23/2016   LDLCALC 66 10/23/2016   ALT 32 11/27/2016   AST 24 11/27/2016   NA 137 11/27/2016   K 3.9 11/27/2016   CL 105 11/27/2016   CREATININE 0.73 11/27/2016   BUN 12 11/27/2016   CO2 24 11/27/2016   TSH 1.770 03/07/2015   INR 0.94 11/27/2016   HGBA1C 6.4 (H) 03/07/2015      Assessment / Plan:     1/Stage IV carcinoma the colon, following colon resection and liver resection now with 9 mm nodule in the lingula medially Suspicious for metastatic disease .  2/Stable 2.6 cm hypovascular lesion in the anterior liver dome, which has indeterminate characteristics.- This may be persistent scarred area of the liver   from the previous resection.  3/history of previous anterior myocardial infarction treated acutely with stenting of the LAD 2014  currently on aspirin and Plavix   On review of the patient's current scans and previous history, the lesion in the lingula/ left lung is most likely metastatic colon cancer. I reviewed the  Will  proceed with navigation bronchoscopy EBUSD  and attempted biopsy of the left lung mass. I discussed with the patient treatment options if this was a metastatic deposit including possible stereotactic radiotherapy or lingulectomy. Patient's scans and history but presented at the multidisciplinary thoracic oncology conference also. The patient would like to tentatively proceed with biopsy on Wednesday, December 6 The goals risks and alternatives of the planned surgical procedure Bronch, navigation and ebus with biopsy   have been discussed with the patient in detail. The risks of the procedure including death, infection, stroke, myocardial infarction, bleeding, blood transfusion have all been discussed specifically.  I have quoted Allen Glenn a 1 % of perioperative mortality and a complication rate as high as 10 %. The patient's questions have been answered.Allen Glenn is willing  to proceed with the planned procedure.  [name] MD      [facility] [place] [address] Office [phone]   Beeper [phone]  11/28/2016 6:44 AM

## 2016-11-28 NOTE — Progress Notes (Signed)
Patient returned from xray via wheelchair.

## 2016-11-28 NOTE — Addendum Note (Signed)
Addendum  created 11/28/16 1232 by Fidela Juneau, CRNA   Anesthesia Intra Flowsheets edited

## 2016-11-28 NOTE — Progress Notes (Signed)
Patient ID: Allen Glenn, male   DOB: May 16, 1956, 60 y.o.   MRN: [id number]     [address]       ,Allen Glenn [address]             [phone]                                                   Allen Glenn Medical Record [id number] Date of Birth: [birth year]  Referring: No ref. provider found Primary Care: [name], MD Oncologist: Dr  Rushie Nyhan Chief Complaint:    Lung mass  History of Present Illness:    Allen Glenn 60 y.o. male is seen in the office for a new just under 1 cm pulmonary nodule in the lingula. This area was not present on a CT scan of the chest done in May 2017. The patient was being followed by oncology after a RIGHT HEMICOLECTOMY LAPAROSCOPIC N/A 05/04/2015  and HEPATECTOMY PARTIAL Right 09/28/2015.  Following his colon resection in May 2016 he underwent 4 cycles of chemotherapy . He is currently under surveillance without active treatment.   The patient also has a history of acute anterior myocardial infarction treated with a stent to the LAD in 2014.  Current Activity/ Functional Status:  Patient is independent with mobility/ambulation, transfers, ADL's, IADL's.   Zubrod Score: At the time of surgery this patient's most appropriate activity status/level should be described as: [x]     0    Normal activity, no symptoms []     1    Restricted in physical strenuous activity but ambulatory, able to do out light work []     2    Ambulatory and capable of self care, unable to do work activities, up and about               >50 % of waking hours                              []     3    Only limited self care, in bed greater than 50% of waking hours []     4    Completely disabled, no self care, confined to bed or chair []     5    Moribund       Past Medical History:  Diagnosis Date  . Acute MI 2014   acute ST elevation MI  . Anemia   . Colon cancer ([place]) 06/13/2016   Status post resection of colon mass is well as he panic  metastasis.   . Coronary artery disease   . Enlarged prostate    slightly and takes Flomax daily  . Heart disease   . History of blood transfusion   . Hyperlipidemia    takes Lipitor daily  . Hypertension    takes Lisinopril daily  . Lung nodule    left  . Nocturia   . Numbness    left foot  . Peripheral neuropathy [facility])          Past Surgical History:  Procedure Laterality Date  . 1/8 of liver removed    . APPENDECTOMY    . COLONOSCOPY    . CORONARY ANGIOPLASTY     1 stent  . CORONARY STENT PLACEMENT  06-27-2013  . HERNIA REPAIR Left  1991  . partial coloectomy     . COLON SURGERY  . COLONOSCOPY  . CORONARY ANGIOPLASTY WITH STENT PLACEMENT 2014  . HEPATECTOMY PARTIAL Right 09/28/2015  Procedure: HEPATECTOMY PARTIAL LAPAROSCOPIC ASSISTED; Surgeon: Patsy Baltimore, MD; Location: [place]; Service: General; Laterality: Right;  . HERNIA REPAIR 1991  left inguinal  . LIVER RESECTION  . RIGHT HEMICOLECTOMY LAPAROSCOPIC N/A 05/04/2015  Procedure: RIGHT HEMICOLECTOMY LAPAROSCOPIC; Surgeon: Marland Kitchen, MD; Location[facility] MAIN OR; Service: General; Laterality: N/A;  . UPPER GASTROINTESTINAL ENDOSCOPY            Family History  Problem Relation Age of Onset  . Hypertension Mother   . Heart disease Mother   . Hypertension Father   . Heart disease Father   . Neuropathy Neg Hx     Social History        Social History  . Marital status: Married    Spouse name: [name]  . Number of children: 2  . Years of education: Bachelor       Occupational History  . Minister        Social History Main Topics  . Smoking status: Never Smoker  . Smokeless tobacco: Never Used  . Alcohol use No  . Drug use: No  . Sexual activity: Not on file       Other Topics Concern  . Not on file      Social History Narrative   Live at home with wife.   Caffeine: very little soda, occasional tea. 12oz-16oz/per     History  Smoking Status  . Never Smoker  Smokeless Tobacco  . Never Used       History  Alcohol Use No         Allergies  Allergen Reactions  . No Known Allergies     No current facility-administered medications for this encounter.       Review of Systems:               Cardiac Review of Systems: Y or N             Chest Pain [  n  ]         Resting SOB [n   ]      Exertional SOB  [n  ]   Orthopnea Florencio.Farrier  ]             Pedal Edema [  n ]      Palpitations [ n ]          Syncope  [ n]              Presyncope [   n]             General Review of Systems: [Y] = yes [  ]=no Constitional: recent weight change n[  ];  Wt loss over the last 3 months [   ] anorexia [  ]; fatigue [  ]; nausea [  ]; night sweats [  ]; fever [  ]; or chills [  ];          Dental: poor dentition[  ]; Last Dentist visit:              Eye : blurred vision [  ]; diplopia [   ]; vision changes [  ];  Amaurosis fugax[  ]; Resp: cough [n  ];  wheezing[ n ];  hemoptysis[ n ]; shortness of breath[ n ]; paroxysmal nocturnal dyspnea[ n ];  dyspnea on exertion[ n ]; or orthopnea[  ];  GI:  gallstones[  ], vomiting[  ];  dysphagia[  ]; melena[  ];  hematochezia [  ]; heartburn[  ];   Hx of  Colonoscopy[  ]; GU: kidney stones [  ]; hematuria[  ];   dysuria [  ];  nocturia[  ];  history of     obstruction [  ]; urinary frequency [  ]             Skin: rash, swelling[  ];, hair loss[  ];  peripheral edema[  ];  or itching[  ]; Musculosketetal: myalgias[  ];  joint swelling[  ];  joint erythema[  ];  joint pain[  ];  back pain[  ];             Heme/Lymph: bruising[n  ];  bleeding[  ];  anemia[  ];  Neuro: TIA[  n];  headaches[  ];  stroke[  ];  vertigo[  ];  seizures[n  ];   paresthesias[  ];  difficulty walkingn[  ];             Psych:depression[  ]; anxiety[  ];             Endocrine: diabetes[y  ];  thyroid dysfunction[ n ];             Immunizations: Flu up to date [  ]; Pneumococcal up to date [   ];             Other:  Physical Exam: BP (!) 142/66   Pulse 65   Temp 98.2 F (36.8 C)   Resp 20   Wt 200 lb (90.7 kg)   SpO2 100%   BMI 25.00 kg/m   PHYSICAL EXAMINATION: General appearance: alert, cooperative and no distress Head: Normocephalic, without obvious abnormality, atraumatic Neck: no adenopathy, no carotid bruit, no JVD, supple, symmetrical, trachea midline and thyroid not enlarged, symmetric, no tenderness/mass/nodules Lymph nodes: Cervical, supraclavicular, and axillary nodes normal. Resp: clear to auscultation bilaterally Back: symmetric, no curvature. ROM normal. No CVA tenderness. Cardio: regular rate and rhythm, S1, S2 normal, no murmur, click, rub or gallop GI: soft, non-tender; bowel sounds normal; no masses,  no organomegaly and Patient has well healed mid abdominal incision and multiple port sites Extremities: extremities normal, atraumatic, no cyanosis or edema Neurologic: Grossly normal  Diagnostic Studies & Laboratory data:     Recent Radiology Findings:  Dg Chest 2 View  Result Date: 11/27/2016 CLINICAL DATA:  Pre admission prior to lung biopsy EXAM: CHEST  2 VIEW COMPARISON:  PET-CT November 19, 2016 FINDINGS: The heart size and mediastinal contours are within normal limits. Both lungs are clear. The PET-CT noted a hypermetabolic nodule in the left lung is not seen on the chest x-ray. Degenerative joint changes of the spine are noted. IMPRESSION: No active cardiopulmonary disease. Electronically Signed   By: Abelardo Diesel M.D.   On: 11/27/2016 14:08   Ct Chest Wo Contrast  Result Date: 11/27/2016 CLINICAL DATA:  Known left upper lobe mass lesion. EXAM: CT CHEST WITHOUT CONTRAST TECHNIQUE: Multidetector CT imaging of the chest was performed following the standard protocol without IV contrast. COMPARISON:  11/08/2016, 11/19/2016 FINDINGS: Cardiovascular: Somewhat limited with the lack of IV contrast. Aortic calcifications are seen without aneurysmal  dilatation. Coronary calcifications are noted. Mediastinum/Nodes: The thoracic inlet is within normal limits. There is again noted a pericardial lymph node best seen on image number 104 series 3  which measures 17 by 10 mm in greatest dimension. This is stable from the recent PET-CT. Few small lymph nodes are noted adjacent to the carina and in the precarinal region which are stable from recent CT. No hilar adenopathy is noted. Lungs/Pleura: The lungs are again well aerated without focal infiltrate or sizable effusion. A left upper lobe nodule is again identified measuring 1 cm in greatest dimension. This corresponds to hypermetabolic lesions seen on recent PET-CT. No other focal nodules are noted. Upper Abdomen: The hypodense lesion in the anterior aspect of the liver is again visualized but less well so due to the lack of IV contrast. The remainder the upper abdomen appears within normal limits. Musculoskeletal: Degenerative changes of the thoracic spine are noted. IMPRESSION: Stable mediastinal and pericardial lymph nodes. Stable 1 cm left upper lobe nodule. Stable but less well visualized hypodense lesion within the liver. No new focal abnormality is seen. Electronically Signed   By: Inez Catalina M.D.   On: 11/27/2016 15:03    Nm Pet Image Restag (ps) Skull Base To Thigh  Result Date: 11/19/2016 CLINICAL DATA:  Initial treatment strategy for colon cancer. EXAM: NUCLEAR MEDICINE PET SKULL BASE TO THIGH TECHNIQUE: 9.7 mCi F-18 FDG was injected intravenously. Full-ring PET imaging was performed from the skull base to thigh after the radiotracer. CT data was obtained and used for attenuation correction and anatomic localization. FASTING BLOOD GLUCOSE:  Value: 147 mg/dl COMPARISON:  CT chest 11/08/2016, MR abdomen 11/08/2016 and 07/09/2016 and CT chest abdomen pelvis 05/07/2016. FINDINGS: NECK No hypermetabolic lymph nodes in the neck. There are air-fluid levels in the maxillary sinuses with scattered  opacification of ethmoid air cells. CHEST Low left paratracheal lymph node measures 8 mm (CT image 68) with an SUV max of 4.1. Infrahilar lingular nodule measures 11 mm (CT image 40) with an SUV max of 3.3, new from 05/07/2016. Pre pericardiac lymph node measures 7 mm (CT image 100), with an SUV max 4.3, new from 05/07/2016. No additional hypermetabolic mediastinal, hilar or axillary lymph nodes. CT images show atherosclerotic calcification of the arterial vasculature, including coronary arteries. No pericardial or pleural effusion. ABDOMEN/PELVIS There is hypermetabolism associated with a hypo attenuating lesion in the dome of the liver which measures roughly 2.0 cm (CT image 96) with SUV max of 6.4 (compared to background liver activity of 4.8). No abnormal hypermetabolism in the adrenal glands, spleen or pancreas. No hypermetabolic lymph nodes. Tiny stones are seen in the gallbladder. Adrenal glands, kidneys, spleen, pancreas, stomach and bowel are grossly unremarkable. There are postoperative changes of right hemicolectomy. SKELETON No abnormal osseous hypermetabolism. IMPRESSION: 1. Hypermetabolic left upper lobe nodule and pre pericardiac lymph node, new from 05/07/2016 and indicative of metastatic disease. 2. Hypermetabolism associated with a hypoattenuating lesion in the dome of the liver, also worrisome for metastatic disease. 3. Aortic atherosclerosis (ICD10-170.0). Coronary artery calcification. 4. Air-fluid levels in the maxillary sinuses with scattered opacification of the ethmoid air cells. 5. Cholelithiasis. Electronically Signed   By: Lorin Picket M.D.   On: 11/19/2016 09:26    Ct Chest W Contrast  Result Date: 11/09/2016 CLINICAL DATA:  Current history of colon cancer. EXAM: CT CHEST WITH CONTRAST TECHNIQUE: Multidetector CT imaging of the chest was performed during intravenous contrast administration. CONTRAST:  79mL ISOVUE-300 IOPAMIDOL (ISOVUE-300) INJECTION 61% COMPARISON:  CT scan of  May 07, 2016. FINDINGS: Cardiovascular: Atherosclerosis of thoracic aorta is noted without aneurysm or dissection. Coronary artery calcifications are noted. Mediastinum/Nodes: No significant mediastinal mass or  adenopathy is noted. Lungs/Pleura: No pneumothorax or pleural effusion is noted. Stable mild bibasilar scarring is noted. New 9 mm nodule is noted in left perihilar region in left upper lobe best seen on image number 82 of series 5. Upper Abdomen: Stable 2.2 cm low density is seen in dome of right hepatic lobe. Musculoskeletal: No significant osseous abnormality is noted. IMPRESSION: Aortic atherosclerosis. Coronary artery calcifications are noted suggesting coronary artery disease. Stable 2.2 cm low density seen in right hepatic lobe concerning for possible metastatic disease. New 9 mm nodule seen in left upper lobe concerning for metastatic disease. These results will be called to the ordering clinician or representative by the Radiologist Assistant, and communication documented in the PACS or zVision Dashboard. Electronically Signed   By: Marijo Conception, M.D.   On: 11/09/2016 08:43   Mr Abdomen Wwo Contrast  Result Date: 11/09/2016 CLINICAL DATA:  Colon carcinoma. Follow-up indeterminate hepatic lesion seen on previous exams. EXAM: MRI ABDOMEN WITHOUT AND WITH CONTRAST TECHNIQUE: Multiplanar multisequence MR imaging of the abdomen was performed both before and after the administration of intravenous contrast. CONTRAST:  61mL MULTIHANCE GADOBENATE DIMEGLUMINE 529 MG/ML IV SOLN COMPARISON:  MRI on 07/09/2016 and CT on 05/07/2016 FINDINGS: Lower chest: No acute findings. Hepatobiliary: A well-circumscribed hypovascular lesion is again seen in the anterior liver dome near the junction of the right and left hepatic lobes. This measures 1.6 x 2.6 cm on image 38/1502, and is not significantly changed in size compared to prior exams. This shows mild internal enhancement and peripheral rim enhancement, but  shows near T2 isointensity to normal hepatic parenchyma. This has nonspecific characteristics. Although interval stability suggests a benign etiology, liver metastasis cannot be excluded. No other liver masses are identified. Pancreas: No mass or inflammatory changes. No evidence of pancreatic ductal dilatation. Incidental note is made of pancreas divisum. Spleen:  Within normal limits in size and appearance. Adrenals/Urinary Tract: No masses identified. Stable tiny benign right renal cyst. No evidence of hydronephrosis. Stomach/Bowel: Visualized portions within the abdomen are unremarkable. Vascular/Lymphatic: No pathologically enlarged lymph nodes identified. No abdominal aortic aneurysm demonstrated. Other:  None. Musculoskeletal:  No suspicious bone lesions identified. IMPRESSION: Stable 2.6 cm hypovascular lesion in the anterior liver dome, which has indeterminate characteristics. Although this remains stable compared to recent studies, liver metastasis cannot definitely be excluded. This is in a difficult location to perform percutaneous biopsy ; consider continued followup by MRI in 6 months. No other liver lesions or evidence of metastatic disease within the abdomen. Electronically Signed   By: Earle Gell M.D.   On: 11/09/2016 10:14     I have independently reviewed the above radiologic studies.  Recent Lab Findings: Recent Labs  Lab Results  Component Value Date   WBC 8.5 11/27/2016   HGB 14.9 11/27/2016   HCT 44.2 11/27/2016   PLT 210 11/27/2016   GLUCOSE 116 (H) 11/27/2016   CHOL 150 10/23/2016   TRIG 211 (H) 10/23/2016   HDL 42 10/23/2016   LDLCALC 66 10/23/2016   ALT 32 11/27/2016   AST 24 11/27/2016   NA 137 11/27/2016   K 3.9 11/27/2016   CL 105 11/27/2016   CREATININE 0.73 11/27/2016   BUN 12 11/27/2016   CO2 24 11/27/2016   TSH 1.770 03/07/2015   INR 0.94 11/27/2016   HGBA1C 6.4 (H) 03/07/2015        Assessment / Plan:     1/Stage IV  carcinoma the colon, following colon resection and liver resection  now with 9 mm nodule in the lingula medially Suspicious for metastatic disease .  2/Stable 2.6 cm hypovascular lesion in the anterior liver dome, which has indeterminate characteristics.- This may be persistent scarred area of the liver   from the previous resection.  3/history of previous anterior myocardial infarction treated acutely with stenting of the LAD 2014 currently on aspirin and Plavix   On review of the patient's current scans and previous history, the lesion in the lingula/ left lung is most likely metastatic colon cancer. I reviewed the  Will  proceed with navigation bronchoscopy EBUSD  and attempted biopsy of the left lung mass. I discussed with the patient treatment options if this was a metastatic deposit including possible stereotactic radiotherapy or lingulectomy. Patient's scans and history but presented at the multidisciplinary thoracic oncology conference also. The patient would like to tentatively proceed with biopsy on Wednesday, December 6 The goals risks and alternatives of the planned surgical procedure Bronch, navigation and ebus with biopsy   have been discussed with the patient in detail. The risks of the procedure including death, infection, stroke, myocardial infarction, bleeding, blood transfusion have all been discussed specifically.  I have quoted [name] Ruffner a 1 % of perioperative mortality and a complication rate as high as 10 %. The patient's questions have been answered.Jahan Landino is willing  to proceed with the planned procedure.   [name] MD      [address] [place] [address] Office [phone]                Beeper [phone]  11/28/2016 6:44 AM

## 2016-11-28 NOTE — Brief Op Note (Signed)
      [address]       [place] [id number]             [phone]      11/28/2016  10:32 AM  PATIENT:  Allen Glenn  60 y.o. male  PRE-OPERATIVE DIAGNOSIS:  Left Upper Lobe Lung  nodule  POST-OPERATIVE DIAGNOSIS:  Left Upper Lobe Lung  nodule  PROCEDURE:  Procedure(s): VIDEO BRONCHOSCOPY WITH ENDOBRONCHIAL NAVIGATION (N/A) VIDEO BRONCHOSCOPY WITH ENDOBRONCHIAL ULTRASOUND (N/A) With lung Biopsy    SURGEON:  Surgeon(s) and Role:    * [name], MD - Primary   ANESTHESIA:   general  EBL:  Total I/O In: 1000 [I.V.:1000] Out: 0   BLOOD ADMINISTERED:none  DRAINS: none   LOCAL MEDICATIONS USED:  NONE  SPECIMEN:  Source of Specimen:  lingular lunf nodule and 4l node   DISPOSITION OF SPECIMEN:  PATHOLOGY  COUNTS:  YES  TOURNIQUET:  * No tourniquets in log *  DICTATION: .Dragon Dictation  PLAN OF CARE: Discharge to home after PACU  PATIENT DISPOSITION:  PACU - hemodynamically stable.   Delay start of Pharmacological VTE agent (>24hrs) due to surgical blood loss or risk of bleeding: yes

## 2016-11-28 NOTE — Progress Notes (Signed)
Patient transported to x-ray via transport staff from x-ray

## 2016-11-28 NOTE — Telephone Encounter (Signed)
Oncology Nurse Navigator Documentation  Oncology Nurse Navigator Flowsheets 11/28/2016  Navigator Location [facility]  Referral date to RadOnc/MedOnc 11/28/2016--transfer care from [facility] Encounter Type Introductory phone call  Confirmed Diagnosis Date 04/25/2015  Surgery Date 05/04/2015  Treatment Initiated Date 06/13/2015 w/ last chemo 02/16/16 at [facility] with Mrs. Bauman and provided new patient appointment for 12/04/16 at 2pm with [name]. Informed of location of [place], valet service, and registration process. Reminded to bring photo ID,  insurance cards and a current medication list, including supplements. wife verbalizes understanding. Is transferring care from [facility] to [name] due to logistics. They see Dr. Rushie Nyhan on 12/11 and she will requested needed records and films on CD when they are there and will bring them with them to the appointment. Emailed her a list of documents to obtain since they are not yet on Mychart. Inquired if he still has his port and she states it was removed and expressed surprise that his cancer has returned. Appointment scheduled and notified HIM to do initiate intake process.

## 2016-11-28 NOTE — Transfer of Care (Signed)
Immediate Anesthesia Transfer of Care Note  Patient: Allen Glenn  Procedure(s) Performed: Procedure(s): VIDEO BRONCHOSCOPY WITH ENDOBRONCHIAL NAVIGATION (N/A) VIDEO BRONCHOSCOPY WITH ENDOBRONCHIAL ULTRASOUND (N/A) LUNG BIOPSY, left upper lobe (Left)  Patient Location: PACU  Anesthesia Type:General  Level of Consciousness: awake, oriented, sedated, patient cooperative and responds to stimulation  Airway & Oxygen Therapy: Patient Spontanous Breathing and Patient connected to nasal cannula oxygen  Post-op Assessment: Report given to RN, Post -op Vital signs reviewed and stable, Patient moving all extremities and Patient moving all extremities X 4  Post vital signs: Reviewed and stable  Last Vitals:  Vitals:   11/28/16 0639 11/28/16 1031  BP:  124/65  Pulse:    Resp:    Temp: 36.8 C 36.4 C    Last Pain: There were no vitals filed for this visit.       Complications: No apparent anesthesia complications

## 2016-11-29 ENCOUNTER — Encounter (HOSPITAL_COMMUNITY): Payer: Self-pay | Admitting: Cardiothoracic Surgery

## 2016-11-29 ENCOUNTER — Observation Stay (HOSPITAL_COMMUNITY): Payer: 59

## 2016-11-29 DIAGNOSIS — J984 Other disorders of lung: Secondary | ICD-10-CM | POA: Diagnosis not present

## 2016-11-29 DIAGNOSIS — R911 Solitary pulmonary nodule: Secondary | ICD-10-CM | POA: Diagnosis not present

## 2016-11-29 DIAGNOSIS — E785 Hyperlipidemia, unspecified: Secondary | ICD-10-CM | POA: Diagnosis not present

## 2016-11-29 DIAGNOSIS — I2510 Atherosclerotic heart disease of native coronary artery without angina pectoris: Secondary | ICD-10-CM | POA: Diagnosis not present

## 2016-11-29 DIAGNOSIS — N400 Enlarged prostate without lower urinary tract symptoms: Secondary | ICD-10-CM | POA: Diagnosis not present

## 2016-11-29 DIAGNOSIS — J95811 Postprocedural pneumothorax: Secondary | ICD-10-CM | POA: Diagnosis not present

## 2016-11-29 DIAGNOSIS — J939 Pneumothorax, unspecified: Secondary | ICD-10-CM | POA: Diagnosis not present

## 2016-11-29 DIAGNOSIS — D649 Anemia, unspecified: Secondary | ICD-10-CM | POA: Diagnosis not present

## 2016-11-29 DIAGNOSIS — I252 Old myocardial infarction: Secondary | ICD-10-CM | POA: Diagnosis not present

## 2016-11-29 DIAGNOSIS — C787 Secondary malignant neoplasm of liver and intrahepatic bile duct: Secondary | ICD-10-CM | POA: Diagnosis not present

## 2016-11-29 MED ORDER — ACETAMINOPHEN 325 MG PO TABS: 650.0000 mg | ORAL_TABLET | Freq: Four times a day (QID) | ORAL | Status: DC | PRN

## 2016-11-29 NOTE — Progress Notes (Addendum)
Allen SheldrakeSuite 411       ,[place] [address]             [phone]      1 Day Post-Op Procedure(s) (LRB): VIDEO BRONCHOSCOPY WITH ENDOBRONCHIAL NAVIGATION (N/A) VIDEO BRONCHOSCOPY WITH ENDOBRONCHIAL ULTRASOUND (N/A) LUNG BIOPSY, left upper lobe (Left)   Subjective:  Allen Glenn says he is doing as well as a "frog's hair split 10 ways."   Objective: Vital signs in last 24 hours: Temp:  [97.6 F (36.4 C)-98.4 F (36.9 C)] 97.7 F (36.5 C) (12/07 0550) Pulse Rate:  [68-98] 81 (12/07 0550) Cardiac Rhythm: Normal sinus rhythm (12/06 1921) Resp:  [12-18] 18 (12/07 0550) BP: (123-144)/(55-91) 124/55 (12/07 0550) SpO2:  [87 %-98 %] 97 % (12/07 0550) Weight:  [203 lb 11.3 oz (92.4 kg)] 203 lb 11.3 oz (92.4 kg) (12/06 1445)   Intake/Output from previous day: 12/06 0701 - 12/07 0700 In: 1680 [P.O.:480; I.V.:1200] Out: 600 [Urine:600]  General appearance: alert, cooperative and no distress Heart: regular rate and rhythm Lungs: clear to auscultation bilaterally Abdomen: soft, non-tender; bowel sounds normal; no masses,  no organomegaly Extremities: extremities normal, atraumatic, no cyanosis or edema  Lab Results:  Recent Labs  11/27/16 1324  WBC 8.5  HGB 14.9  HCT 44.2  PLT 210   BMET:  Recent Labs  11/27/16 1324  NA 137  K 3.9  CL 105  CO2 24  GLUCOSE 116*  BUN 12  CREATININE 0.73  CALCIUM 8.5*    PT/INR:  Recent Labs  11/27/16 1324  LABPROT 12.5  INR 0.94   ABG No results found for: PHART, HCO3, TCO2, ACIDBASEDEF, O2SAT CBG (last 3)  No results for input(s): GLUCAP in the last 72 hours.  Dg Chest 2 View  Result Date: 11/29/2016 CLINICAL DATA:  Left lung biopsy yesterday, followup of left-sided pneumothorax EXAM: CHEST  2 VIEW COMPARISON:  Chest x-ray of 11/28/2016 FINDINGS: The volume of the left apical pneumothorax has not changed significantly. Bibasilar atelectasis remains. Heart size is stable. IMPRESSION: No change in left  apical pneumothorax. Electronically Signed   By: Ivar Drape M.D.   On: 11/29/2016 08:01   X-ray Chest Pa And Lateral  Result Date: 11/28/2016 CLINICAL DATA:  Follow-up left pneumothorax. EXAM: CHEST  2 VIEW COMPARISON:  Earlier this day at 1221 hour FINDINGS: Left pneumothorax has decreased in size, approximately 15-20% currently. Basilar component is no longer visualized. Left lung base atelectasis again seen. No mediastinal shift. Right lung is clear. Normal heart size. IMPRESSION: Decreasing size of moderate left pneumothorax since exam 6 hours earlier. Electronically Signed   By: Jeb Levering M.D.   On: 11/28/2016 19:54   Dg Chest 2 View  Result Date: 11/28/2016 CLINICAL DATA:  Shortness of breath, cough. EXAM: CHEST  2 VIEW COMPARISON:  11/28/2016 FINDINGS: Left pneumothorax has enlarged somewhat since prior study. There is left base atelectasis. Right lung is clear. Heart is normal size. IMPRESSION: Enlarging left pneumothorax, now approximately 25-30%. Critical Value/emergent results were called by telephone at the time of interpretation on 11/28/2016 at 12:32 pm to Dr. Lanelle Bal , who verbally acknowledged these results. Electronically Signed   By: Rolm Baptise M.D.   On: 11/28/2016 12:36   Dg Chest Port 1 View  Result Date: 11/28/2016 CLINICAL DATA:  Post bronchoscopy with biopsy. EXAM: PORTABLE CHEST 1 VIEW COMPARISON:  11/27/2016 FINDINGS: Left base opacity, likely atelectasis. Right lung is clear. Heart is normal size. There is moderate-sized  left pneumothorax noted at the left base and over the left upper lobe/apex. This is approximately 15-20%. IMPRESSION: Moderate-sized left pneumothorax, 15-20%. These results will be called to the ordering clinician or representative by the Radiologist Assistant, and communication documented in the PACS or zVision Dashboard. Electronically Signed   By: Rolm Baptise M.D.   On: 11/28/2016 11:21    Assessment/Plan: S/P Procedure(s)  (LRB): VIDEO BRONCHOSCOPY WITH ENDOBRONCHIAL NAVIGATION (N/A) VIDEO BRONCHOSCOPY WITH ENDOBRONCHIAL ULTRASOUND (N/A) LUNG BIOPSY, left upper lobe (Left)  1. Pneumothorax- post biopsy, CXR is stable- will plan to d/c patient home today 2. Dispo- patient stable, will d/c home today, f/u in 1 week   LOS: 0 days    BARRETT, ERIN 11/29/2016  Chest xray stable, patient asymptomatic Path still pending  I have seen and examined Allen Glenn and agree with the above assessment  and plan.  [name] MD Bee[facility] [phone] Office [phone] 11/29/2016 8:18 AM

## 2016-11-29 NOTE — Progress Notes (Signed)
Patient discharged per MD order. IV removed, telemetry d/c, CCMD notified. AVS completed and went over with patient and spouse. No further questions at this time. To home with wife.  Cyndia Bent  RN

## 2016-11-29 NOTE — Discharge Instructions (Signed)
Flexible Bronchoscopy, Care After Refer to this sheet in the next few weeks. These instructions provide you with information on caring for yourself after your procedure. Your health care provider may also give you more specific instructions. Your treatment has been planned according to current medical practices, but problems sometimes occur. Call your health care provider if you have any problems or questions after your procedure. What can I expect after the procedure? It is normal to have the following symptoms for 24-48 hours after the procedure:  Increased cough.  Low-grade fever.  Sore throat or hoarse voice.  Small streaks of blood in your thick spit (sputum) if tissue samples were taken (biopsy). Follow these instructions at home:  Do not eat or drink anything for 2 hours after your procedure. Your nose and throat were numbed by medicine. If you try to eat or drink before the medicine wears off, food or drink could go into your lungs or you could burn yourself. After the numbness is gone and your cough and gag reflexes have returned, you may eat soft food and drink liquids slowly.  The day after the procedure, you can go back to your normal diet.  You may resume normal activities.  Keep all follow-up visits as directed by your health care provider. It is important to keep all your appointments, especially if tissue samples were taken for testing (biopsy). Get help right away if:  You have increasing shortness of breath.  You become light-headed or faint.  You have chest pain.  You have any new concerning symptoms.  You cough up more than a small amount of blood.  The amount of blood you cough up increases. This information is not intended to replace advice given to you by your health care provider. Make sure you discuss any questions you have with your health care provider. Document Released: 06/29/2005 Document Revised: 05/17/2016 Document Reviewed: 08/14/2013 Elsevier  Interactive Patient Education  2017 [employer]   Pneumothorax Introduction A pneumothorax, commonly called a collapsed lung, is a condition in which air leaks from a lung and builds up in the space between the lung and the chest wall (pleural space). The air in a pneumothorax is trapped outside the lung and takes up space, preventing the lung from fully expanding. This is a condition that usually occurs suddenly. The buildup of air may be small or large. A small pneumothorax may go away on its own. When a pneumothorax is larger, it will often require medical treatment and hospitalization. What are the causes? A pneumothorax can sometimes happen quickly with no apparent cause. People with underlying lung problems, particularly COPD or emphysema, are at higher risk of pneumothorax. However, pneumothorax can happen quickly even in people with no prior known lung problems. Trauma, surgery, medical procedures, or injury to the chest wall can also cause a pneumothorax. What are the signs or symptoms? Sometimes a pneumothorax will have no symptoms. When symptoms are present, they can include:  Chest pain.  Shortness of breath.  Increased rate of breathing.  Bluish color to your lips or skin (cyanosis). How is this diagnosed? Pneumothorax is usually diagnosed by a chest X-ray or chest CT scan. Your health care provider will also take a medical history and perform a physical exam to determine why you may have a pneumothorax. How is this treated? A small pneumothorax may go away on its own without treatment. Extra oxygen can sometimes help a small pneumothorax go away more quickly. For a larger pneumothorax or a  pneumothorax that is causing symptoms, a procedure is usually needed to drain the air.In some cases, the health care provider may drain the air using a needle. In other cases, a chest tube may be inserted into the pleural space. A chest tube is a small tube placed between the ribs and into  the pleural space. This removes the extra air and allows the lung to expand back to its normal size. A large pneumothorax will usually require a hospital stay. If there is ongoing air leakage into the pleural space, then the chest tube may need to remain in place for several days until the air leak has healed. In some cases, surgery may be needed. Follow these instructions at home:  Only take over-the-counter or prescription medicines as directed by your health care provider.  If a cough or pain makes it difficult for you to sleep at night, try sleeping in a semi-upright position in a recliner or by using 2 or 3 pillows.  Rest and limit activity as directed by your health care provider.  If you had a chest tube and it was removed, ask your health care provider when it is okay to remove the dressing. Until your health care provider says you can remove the dressing, do not allow it to get wet.  Do not smoke. Smoking is a risk factor for pneumothorax.  Do not fly in an airplane or scuba dive until your health care provider says it is okay.  Follow up with your health care provider as directed. Get help right away if:  You have increasing chest pain or shortness of breath.  You have a cough that is not controlled with suppressants.  You begin coughing up blood.  You have pain that is getting worse or is not controlled with medicines.  You cough up thick, discolored mucus (sputum) that is yellow to green in color.  You have redness, increasing pain, or discharge at the site where a chest tube had been in place (if your pneumothorax was treated with a chest tube).  The site where your chest tube was located opens up.  You feel air coming out of the site where the chest tube was placed.  You have a fever or persistent symptoms for more than 2-3 days.  You have a fever and your symptoms suddenly get worse. This information is not intended to replace advice given to you by your health  care provider. Make sure you discuss any questions you have with your health care provider. Document Released: 12/10/2005 Document Revised: 05/17/2016 Document Reviewed: 05/05/2014  2017 Elsevier

## 2016-12-03 DIAGNOSIS — C189 Malignant neoplasm of colon, unspecified: Secondary | ICD-10-CM | POA: Diagnosis not present

## 2016-12-03 DIAGNOSIS — C787 Secondary malignant neoplasm of liver and intrahepatic bile duct: Secondary | ICD-10-CM | POA: Diagnosis not present

## 2016-12-03 DIAGNOSIS — G629 Polyneuropathy, unspecified: Secondary | ICD-10-CM | POA: Diagnosis not present

## 2016-12-04 ENCOUNTER — Encounter: Payer: Self-pay | Admitting: *Deleted

## 2016-12-04 ENCOUNTER — Ambulatory Visit (HOSPITAL_BASED_OUTPATIENT_CLINIC_OR_DEPARTMENT_OTHER): Payer: 59 | Admitting: Oncology

## 2016-12-04 ENCOUNTER — Encounter: Payer: Self-pay | Admitting: Radiation Oncology

## 2016-12-04 VITALS — BP 135/61 | HR 83 | Temp 98.4°F | Resp 18 | Ht 74.0 in | Wt 202.3 lb

## 2016-12-04 DIAGNOSIS — R911 Solitary pulmonary nodule: Secondary | ICD-10-CM | POA: Diagnosis not present

## 2016-12-04 DIAGNOSIS — C182 Malignant neoplasm of ascending colon: Secondary | ICD-10-CM

## 2016-12-04 DIAGNOSIS — C787 Secondary malignant neoplasm of liver and intrahepatic bile duct: Secondary | ICD-10-CM

## 2016-12-04 NOTE — Op Note (Signed)
NAMEOLOF, FADELY                  ACCOUNT NO.:  [id number]  MEDICAL RECORD NO.:  [id number]  LOCATION:                                 FACILITY:  PHYSICIAN:  Lanelle Bal, MD    DATE OF BIRTH:  11-17-1956  DATE OF PROCEDURE:  11/28/2016 DATE OF DISCHARGE:  11/29/2016                              OPERATIVE REPORT   PREOPERATIVE DIAGNOSES:  Left upper lobe lung nodule.  History of stage IV carcinoma of the colon.  POSTOPERATIVE DIAGNOSES:  Left upper lobe lung nodule.  History of stage IV carcinoma of the colon.  SURGICAL PROCEDURE:  Video bronchoscopy with navigation bronchoscopy and biopsy and also endobronchial ultrasound with node biopsy.  SURGEON:  Lanelle Bal, MD.  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  On screening CT, he was noted to have development of a new 1 cm lingular nodule that was not present on previous scans.  A followup PET scan showed this area to be hypermetabolic.  In addition, his retrosternal lymph node was also hypermetabolic and mild hypermetabolic activity along the left side of the trachea though the nodes in this area were not enlarged on CT.  To obtain a tissue diagnosis, the patient was referred to Thoracic Surgery.  It was recommended to the patient that we proceed with bronchoscopy and navigation bronchoscopy with biopsy with the target of the lingular nodule and also EBUS.  Risks and options were discussed with the patient who agreed and signed informed consent.  DESCRIPTION OF PROCEDURE:  The patient underwent general endotracheal anesthesia without incident.  Appropriate time-out was performed and then we proceeded with a 2.8-mm video bronchoscope through the endotracheal tube and examined the tracheobronchial tree to the subsegmental level, both the left and right tracheobronchial trees. There were no obvious endobronchial  lesions.  A previous plan based on the Super D CT of the chest had been developed with 2 pathways to the target in the lingula.  We proceeded with registration of the plan with the Super D computer.  The registration went well.  We then proceeded with the working channel and used pathway to the lingual nodule.  A working channel was placed within just less than 1 cm from the target with fluoroscopic confirmation.  We then proceeded with needle brush of the area several passes.  In addition, a triple brush was placed. Intermittently, we re-calibrated our navigation.  Cytologic smears were made.  We also then proceeded with small biopsy forceps through the working channel and biopsied that area.  To obtain further tissue, we then re-navigated using a second pathway to the same lesion and again passed the triple brush and created cytologic slides.  With numerous passes made we then removed the working channel and navigation equipment and proceeded with placing the EBUS scope.  We were targeting a 4 L lymph node though not particularly enlarged was slightly hypermetabolic. With EBUS scope, lymph nodes along the left trachea as the left upper lobe bronchus comes off were identified and were not particularly enlarged, but we did do a transbronchial  needle biopsy with several passes to this area labeled 4 L nodes.  Initial with minimal blood loss and the patient stable, the scopes were removed.  Fluoroscopy did not demonstrate evidence of a pneumothorax. The patient was extubated in the operating room and transferred to the recovery room for further postoperative care.  He tolerated the procedure without obvious complications.  Initial smears were negative for tumor.     Lanelle Bal, MD     EG/MEDQ  D:  12/03/2016  T:  12/04/2016  Job:  [id number]

## 2016-12-04 NOTE — Progress Notes (Signed)
Oncology Nurse Navigator Documentation  Oncology Nurse Navigator Flowsheets 12/04/2016  Navigator Location [facility]  Referral date to RadOnc/MedOnc -  Navigator Encounter Type Initial MedOnc  Abnormal Finding Date 04/25/2015  Confirmed Diagnosis Date 04/25/2015  Surgery Date -  Treatment Initiated Date -  Patient Visit Type MedOnc;Initial  Treatment Phase Abnormal Scans  Barriers/Navigation Needs Education;Coordination of Care  Education Accessing Care/ Finding Providers  Interventions Coordination of Care--referral to radiation oncology  Coordination of Care Appts--scheduled w/Dr. Lisbeth Renshaw on 12/20/16 at 0830/0900  Support Groups/Services GI Support Group;[place]  Acuity Level 2  Time Spent with Patient 36  Met with patient and wife, [name] during new patient visit. Explained the role of the GI Nurse Navigator and provided New Patient Packet with information on: 1.Support groups 2. Advanced Directives 3. Fall Safety Plan Answered questions, reviewed current treatment plan using TEACH back and provided emotional support. Provided copy of current treatment plan. [name] is likely transferring his care from [place] to [place] since they now live in [place]. He and his wife [name] have one son who has ulcerative colitis. 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 He has resumed fairly normal activity now and has been going back to the gym. He had a MI w/stent in 2014 and is still on Plavix. Is in process of changing cardiologist as well. Case will be presented at tumor board on 12/27 prior to visit with Dr. Lisbeth Renshaw and return to [name]. Scheduled his follow up and printed his calendar.  Merceda Elks, RN, BSN GI Oncology [place]

## 2016-12-04 NOTE — Patient Instructions (Signed)
Care Plan Summary- 12/04/2016 Name: Allen Glenn       DOB: [birth year] Your Medical Team:  Medical Oncologist:  Dr. Ma Rings Radiation Oncologist:  Dr. Kyung Rudd  Surgeon:   Dr. Prudence Davidson. Allen Glenn Type of Cancer: Metastatic Colon Cancer--adenocarcinoma  Stage/Grade: Stage IV *Exact staging of your cancer is based on size of the tumor, depth of invasion, involvement of lymph nodes or not, and whether or not the cancer has spread beyond the primary site.   Recommendations: Based on information available as of today's consult. Recommendations may change depending on the results of further tests or exams. 1) Referral to Dr. Kyung Rudd to consider stereotactic radiosurgery       2)  Rescan in Jan/Feb       3) 2nd line chemotherapy with FOLFIRI/Avastin       4) [name] will discuss possibility of mediastinoscopy to biopsy node _______________________________________________________________________ Next Steps:  1) Dr. Lisbeth Renshaw on 12/20/16 at 0830/0900 2) [name] on 12/26/16 at [place] 3) We will present case in tumor board on 12/19/16 Questions? Merceda Elks, RN, BSN at [phone]. Allen Glenn is your Oncology Nurse Navigator and is available to assist you while you're receiving your [place].

## 2016-12-04 NOTE — Patient Instructions (Signed)
Care Plan Summary- 12/04/2016 Name: Allen Glenn       DOB: 1956-02-05 Your Medical Team:  Medical Oncologist:  Dr. Ma Rings Radiation Oncologist:  Dr. Kyung Rudd  Surgeon:   Dr. Prudence Davidson. Carlis Abbott Type of Cancer: Metastatic Colon Cancer--adenocarcinoma  Stage/Grade: Stage IV *Exact staging of your cancer is based on size of the tumor, depth of invasion, involvement of lymph nodes or not, and whether or not the cancer has spread beyond the primary site.   Recommendations: Based on information available as of today's consult. Recommendations may change depending on the results of further tests or exams. 1) Referral to Dr. Kyung Rudd to consider stereotactic radiosurgery       2)  Rescan in Jan/Feb       3) 2nd line chemotherapy with FOLFIRI/Avastin       4) [name] will discuss possibility of mediastinoscopy to biopsy node _______________________________________________________________________ Next Steps:  1) Dr. Lisbeth Renshaw on 12/20/16 at 0830/0900 2) [name] on 12/26/16 at [place] 3) We will present case in tumor board on 12/19/16 Questions? Merceda Elks, RN, BSN at [phone]. Allen Glenn is your Oncology Nurse Navigator and is available to assist you while you're receiving your [place].

## 2016-12-04 NOTE — Progress Notes (Signed)
[place] Patient Consult   Referring MD: Deklin Bieler 60 y.o.  03-Nov-1956    Reason for Referral: Colon cancer   HPI: Mr. Kluth reports he was found to have severe anemia when he underwent preprocedure labs for a cardiac catheterization. He developed chest pain and malaise prior to being found to have a hemoglobin of 6.9. He was transfused with red blood cells and received IV iron. A colonoscopy on 04/25/2015 revealed a mass in the right colon. He was referred to [name]. CT images revealed a solitary 2.9 cm liver metastasis. He was taken to operating room 05/04/2015 and underwent a right colectomy and liver biopsy. The pathology revealed a moderately differentiated adenocarcinoma of the right colon. Tumor invaded into the subserosal adipose tissue. Lymphovascular invasion was present. Metastatic carcinoma was identified in 7 of 22 lymph nodes. The liver biopsy was consistent with metastatic adenocarcinoma from a colon primary. One of 2 additional ileocolic lymph nodes contained metastatic carcinoma. He was seen by medical oncology a PET scan revealed an isolated segment 4A liver metastasis. No other evidence of metastatic disease. He was treated with FOLFOX beginning 06/13/2015. Restaging CTs after 4 cycles revealed an interval decrease in the size of the hepatic metastasis. No new lesions. A borderline enlarged ileocolic node decreased in size. He completed a fifth cycle of FOLFOX and was referred to [name] for a liver resection procedure. He was taken the operating room for a laparoscopic assisted left partial hepatectomy on 09/28/2015. The pathology returned consistent with metastatic colon cancer. The resection margin was negative.  He restarted adjuvant FOLFOX 11/08/2015 with oxaliplatin eliminated beginning 11/29 2016 secondary to neuropathy. He completed adjuvant therapy 02/16/2016.  Mr. Canady reports feeling well. A restaging chest CT  11/08/2016 compared to 05/07/2016 revealed a new 9 mm nodule in the left upper lobe. A stable 2.2cm density was seen in the right hepatic lobe. A PET scan 11/19/2016 confirmed a hypermetabolic left upper lobe nodule, left peritracheal node, and a hypermetabolic pre-cardiac lymph node, new from 05/07/2016. Hypermetabolism was also associated with the hypoattenuating lesion in the dome of the liver.  He was referred to Dr. Servando Snare and was taken to the operating for a bronchoscopy and EBUS biopsy on 11/29/2016. He left lingula nodule was biopsied as was a level 4L node no endobronchial lesions.  The biopsies from the left lingula revealed no evidence of malignancy. Biopsies of the level 4L node revealed no malignant cells. Bronchial washings were negative.  Mr. Jerger would like to transfer his Oncology care to [facility]. He feels well at present.   Past Medical History:  Diagnosis Date  . Acute MI 2014   acute ST elevation MI  . Anemia May 2016   . Colon cancer (HCC)-Stage IV  06/13/2016   Status post resection of colon mass is well as Hepatic metastasis.   . Coronary artery disease   . Enlarged prostate    slightly and takes Flomax daily  .    Marland Kitchen History of blood transfusion   . Hyperlipidemia    takes Lipitor daily  . Hypertension    takes Lisinopril daily  . Lung nodule    left  . Nocturia   . Numbness    left foot  . Peripheral neuropathy [facility])     Past Surgical History:  Procedure Laterality Date  . 1/8 of liver removed    . APPENDECTOMY    . COLONOSCOPY    . CORONARY ANGIOPLASTY  1 stent  . CORONARY STENT PLACEMENT  06-27-2013  . HERNIA REPAIR Left 1991  . LUNG BIOPSY Left 11/28/2016   Procedure: LUNG BIOPSY, left upper lobe;  Surgeon: [name], MD;  Location: [place];  Service: Thoracic;  Laterality: Left;  . partial coloectomy    . VIDEO BRONCHOSCOPY WITH ENDOBRONCHIAL NAVIGATION N/A 11/28/2016   Procedure: VIDEO BRONCHOSCOPY WITH ENDOBRONCHIAL NAVIGATION;   Surgeon: [name], MD;  Location: [place];  Service: Thoracic;  Laterality: N/A;  . VIDEO BRONCHOSCOPY WITH ENDOBRONCHIAL ULTRASOUND N/A 11/28/2016   Procedure: VIDEO BRONCHOSCOPY WITH ENDOBRONCHIAL ULTRASOUND;  Surgeon: [name], MD;  Location: [place];  Service: Thoracic;  Laterality: N/A;    Medications: Reviewed  Allergies:  Allergies  Allergen Reactions  . No Known Allergies     Family history: His paternal grandmother had "cancer ". A maternal cousin has pancreas cancer. No other family history of cancer.  Social History:   He lives with his wife in [place]. He is a Company secretary. He is currently disabled. He does not use cigarettes or alcohol. No risk factor for HIV or hepatitis.    ROS:   Positives include: Sinus infection 3 weeks ago  A complete ROS was otherwise negative.  Physical Exam:  Blood pressure 135/61, pulse 83, temperature 98.4 F (36.9 C), temperature source Oral, resp. rate 18, height 6' 2" (1.88 m), weight 202 lb 4.8 oz (91.8 kg), SpO2 99 %.  HEENT: Oropharynx without visible mass, neck without mass Lungs: Clear bilaterally Cardiac: Regular rate and rhythm Abdomen: No hepatosplenomegaly, no mass, nontender GU: Testes without mass  Vascular: No leg edema Lymph nodes: No cervical, supraclavicular, axillary, or inguinal nodes Neurologic: Alert and oriented, the motor exam appears intact in the upper and lower extremities Skin: No rash Musculoskeletal: No spine tenderness   LAB:  CBC  Lab Results  Component Value Date   WBC 8.5 11/27/2016   HGB 14.9 11/27/2016   HCT 44.2 11/27/2016   MCV 89.3 11/27/2016   PLT 210 11/27/2016     CMP      Component Value Date/Time   NA 137 11/27/2016 1324   NA 141 03/07/2015 1103   K 3.9 11/27/2016 1324   CL 105 11/27/2016 1324   CO2 24 11/27/2016 1324   GLUCOSE 116 (H) 11/27/2016 1324   BUN 12 11/27/2016 1324   BUN 12 03/07/2015 1103   CREATININE 0.73 11/27/2016 1324   CALCIUM 8.5 (L)  11/27/2016 1324   PROT 7.0 11/27/2016 1324   PROT 6.7 03/07/2015 1103   ALBUMIN 3.9 11/27/2016 1324   ALBUMIN 4.4 03/07/2015 1103   AST 24 11/27/2016 1324   ALT 32 11/27/2016 1324   ALKPHOS 72 11/27/2016 1324   BILITOT 0.6 11/27/2016 1324   BILITOT 0.3 03/07/2015 1103   GFRNONAA >60 11/27/2016 1324   GFRAA >60 11/27/2016 1324    11/12/2016: CEA-2.4   Imaging:  As per history of present illness, I reviewed the 11/19/2016 PET images with Mr. Vore and his wife   Assessment/Plan:   1. Stage IV (pT3,pN2b,M1) sees moderately differentiated adenocarcinoma of the right colon, status post a right colectomy 05/04/2015, Foundation 1 testing-MSI-stable, K-ras G12C mutations. No BRAF mutation  Liver biopsy 05/04/2015-metastatic adenocarcinoma consistent with a colon primary  Staging PET scan 06/08/2015-isolated segment 4A liver lesion  Initiation of adjuvant FOLFOX 06/13/2015  Restaging CT 08/09/2015 revealed a slight decrease in a borderline ileocolic node, decrease in the hepatic dome metastasis, no new lesions  Liver resection 09/28/2015-pathology  consistent with metastatic colon cancer, negative margins  Adjuvant FOLFOX resumed 11/08/2015, oxaliplatin eliminated beginning 11/22/2015 secondary to neuropathy. He completed adjuvant chemotherapy 02/16/2016  Restaging chest CT 11/08/2016, compared to 05/07/2016 revealed a new 9 mm left upper lobe nodule, stable 2.2 cm right hepatic lesion  PET scan 11/19/2016 confirmed a hypermetabolic left upper lobe nodule, hypermetabolic left paratracheal and pericardiac lymph nodes, and hypermetabolism associated with the hypoattenuating lesion in the dome of the liver  Status post EBUS biopsies of the left lingula nodule and a level 4L node on 11/28/2016-no evidence of bleeding as he   2.    Coronary artery disease status post a myocardial infarction in 2014  3.    Hypertension  4.    Hyperlipidemia  Disposition:   Mr. Ryant has metastatic  colon cancer. He was diagnosed with metastatic disease when he presented with a right colon mass in May 2016. He underwent resection of the right colon and an isolated liver metastases. He completed an adjuvant course of FOLFOX chemotherapy.  A restaging CT and PET evaluation reveals evidence of recurrent disease involving a lingula nodule and chest lymph nodes. I suspect the "lesion" noted in the liver dome represents the metastectomy site.  An EBUS biopsy of the lingula nodule and a mediastinal lymph node was nondiagnostic.  I discussed treatment options at length with Mr. Nephew and his wife. He understands  the PET findings likely represent progression of the metastatic colon cancer. The differential diagnosis includes a new lung primary, but he is a never smoker.  He will be referred to Dr. Lisbeth Renshaw to consider SRS to the lingula nodule, but this would not be curative if the chest lymph nodes harbor metastatic disease.  I think it is very unlikely he will undergo curative therapy based on the initial clinical presentation with a right colon tumor with multiple positive lymph nodes and a liver metastasis.  I recommend observation for now. He will see Dr. Lisbeth Renshaw and I will present his case at the GI tumor conference. We can discuss the possibility of a diagnostic mediastinoscopy with Dr. Servando Snare. I favor observation and a three-month interval follow-up CT.  The tumor is K-ras mutated. I will recommend FOLFIRI/Avastin if he require systemic therapy in the future.  Approximately 60 minutes were spent with the patient today. The majority of the time was used for counseling and coordination of care.  Betsy Coder, MD  12/04/2016, 2:28 PM

## 2016-12-05 ENCOUNTER — Other Ambulatory Visit: Payer: Self-pay | Admitting: Cardiothoracic Surgery

## 2016-12-05 DIAGNOSIS — R911 Solitary pulmonary nodule: Secondary | ICD-10-CM

## 2016-12-06 ENCOUNTER — Ambulatory Visit
Admission: RE | Admit: 2016-12-06 | Discharge: 2016-12-06 | Disposition: A | Discharge status: Home / Self Care | Payer: 59 | Source: Ambulatory Visit | Attending: Oncology | Admitting: Oncology

## 2016-12-06 ENCOUNTER — Encounter: Payer: Self-pay | Admitting: Cardiothoracic Surgery

## 2016-12-06 ENCOUNTER — Ambulatory Visit
Admission: RE | Admit: 2016-12-06 | Discharge: 2016-12-06 | Disposition: A | Discharge status: Home / Self Care | Payer: 59 | Source: Ambulatory Visit | Attending: Cardiothoracic Surgery | Admitting: Cardiothoracic Surgery

## 2016-12-06 ENCOUNTER — Other Ambulatory Visit: Payer: Self-pay | Admitting: Oncology

## 2016-12-06 ENCOUNTER — Ambulatory Visit (INDEPENDENT_AMBULATORY_CARE_PROVIDER_SITE_OTHER): Payer: 59 | Admitting: Cardiothoracic Surgery

## 2016-12-06 VITALS — BP 124/71 | HR 82 | Resp 16 | Ht 74.0 in | Wt 202.0 lb

## 2016-12-06 DIAGNOSIS — D491 Neoplasm of unspecified behavior of respiratory system: Secondary | ICD-10-CM | POA: Diagnosis not present

## 2016-12-06 DIAGNOSIS — C801 Malignant (primary) neoplasm, unspecified: Secondary | ICD-10-CM

## 2016-12-06 DIAGNOSIS — K769 Liver disease, unspecified: Secondary | ICD-10-CM | POA: Diagnosis not present

## 2016-12-06 DIAGNOSIS — Z85038 Personal history of other malignant neoplasm of large intestine: Secondary | ICD-10-CM

## 2016-12-06 DIAGNOSIS — R911 Solitary pulmonary nodule: Secondary | ICD-10-CM

## 2016-12-06 DIAGNOSIS — J939 Pneumothorax, unspecified: Secondary | ICD-10-CM | POA: Diagnosis not present

## 2016-12-06 NOTE — Progress Notes (Signed)
[address]       [place] [address]             [phone]                    Shadi Rickey D[place] Medical Record [id number] Date of Birth: 03-25-1956  Referring: Hoover Browns* Primary Care: [name], MD Oncologist: Dr  Rushie Nyhan Chief Complaint:    Chief Complaint  Patient presents with  . Routine Post Op    s/p BRONCH/EBUS/BX 11/28/16    History of Present Illness:    Allen Glenn 60 y.o. male is seen in the office  today for a new just under 1 cm pulmonary nodule in the lingula. This area was not present on a CT scan of the chest done in May 2017. The patient was being followed by oncology after a RIGHT HEMICOLECTOMY LAPAROSCOPIC N/A 05/04/2015  and HEPATECTOMY PARTIAL Right 09/28/2015.  Following his colon resection in May 2016 he underwent 4 cycles of chemotherapy . He is currently under surveillance without active treatment.   The patient also has a history of acute anterior myocardial infarction treated with a stent to the LAD in 2014.   Patient returns to office today after recent navigation bronchoscopy and ebus of the left lung nodule and mass- unfortunately no definitive tissue diagnosis could be made on the limited material obtained. The patient did develop a pneumothorax after surgery and was kept for observation overnight. Since then he has had no difficulties with breathing. He comes in today for follow-up chest x-ray. Since discharge he said been seen by his oncologist at [facility] and also by [name].   Current Activity/ Functional Status:  Patient is independent with mobility/ambulation, transfers, ADL's, IADL's.   Zubrod Score: At the time of surgery this patient's most appropriate activity status/level should be described as: [x]     0    Normal activity, no symptoms []     1    Restricted in physical strenuous activity but ambulatory, able to do out light work []     2    Ambulatory and capable of self care, unable to  do work activities, up and about               >50 % of waking hours                              []     3    Only limited self care, in bed greater than 50% of waking hours []     4    Completely disabled, no self care, confined to bed or chair []     5    Moribund   Past Medical History:  Diagnosis Date  . Acute MI 2014   acute ST elevation MI  . Anemia   . Colon cancer ([place]) 06/13/2016   Status post resection of colon mass is well as he panic metastasis.   . Coronary artery disease   . Enlarged prostate    slightly and takes Flomax daily  . Heart disease   . History of blood transfusion   . Hyperlipidemia    takes Lipitor daily  . Hypertension    takes Lisinopril daily  . Lung nodule    left  . Nocturia   . Numbness    left foot  . Peripheral neuropathy [facility])     Past Surgical History:  Procedure Laterality Date  . 1/8 of liver removed    . APPENDECTOMY    . COLONOSCOPY    . CORONARY ANGIOPLASTY     1 stent  . CORONARY STENT PLACEMENT  06-27-2013  . HERNIA REPAIR Left 1991  . LUNG BIOPSY Left 11/28/2016   Procedure: LUNG BIOPSY, left upper lobe;  Surgeon: [name], MD;  Location: [place];  Service: Thoracic;  Laterality: Left;  . partial coloectomy    . VIDEO BRONCHOSCOPY WITH ENDOBRONCHIAL NAVIGATION N/A 11/28/2016   Procedure: VIDEO BRONCHOSCOPY WITH ENDOBRONCHIAL NAVIGATION;  Surgeon: [name], MD;  Location: [place];  Service: Thoracic;  Laterality: N/A;  . VIDEO BRONCHOSCOPY WITH ENDOBRONCHIAL ULTRASOUND N/A 11/28/2016   Procedure: VIDEO BRONCHOSCOPY WITH ENDOBRONCHIAL ULTRASOUND;  Surgeon: [name], MD;  Location: [place];  Service: Thoracic;  Laterality: N/A;   . COLON SURGERY  . COLONOSCOPY  . CORONARY ANGIOPLASTY WITH STENT PLACEMENT 2014  . HEPATECTOMY PARTIAL Right 09/28/2015  Procedure: HEPATECTOMY PARTIAL LAPAROSCOPIC ASSISTED; Surgeon: Patsy Baltimore, MD; Location: [place]; Service: General; Laterality: Right;  . HERNIA REPAIR  1991  left inguinal  . LIVER RESECTION  . RIGHT HEMICOLECTOMY LAPAROSCOPIC N/A 05/04/2015  Procedure: RIGHT HEMICOLECTOMY LAPAROSCOPIC; Surgeon: Marland Kitchen, MD; Location: [facility] MAIN OR; Service: General; Laterality: N/A;  . UPPER GASTROINTESTINAL ENDOSCOPY       Family History  Problem Relation Age of Onset  . Hypertension Mother   . Heart disease Mother   . Hypertension Father   . Heart disease Father   . Neuropathy Neg Hx     Social History   Social History  . Marital status: Married    Spouse name: [name]  . Number of children: 2  . Years of education: Bachelor   Occupational History  . Minister    Social History Main Topics  . Smoking status: Never Smoker  . Smokeless tobacco: Never Used  . Alcohol use No  . Drug use: No  . Sexual activity: Not on file   Other Topics Concern  . Not on file   Social History Narrative   Live at home with wife, [name]   Patient is a [employer] manager, active and goes to gym   Caffeine: very little soda, occasional tea. 12oz-16oz/per    History  Smoking Status  . Never Smoker  Smokeless Tobacco  . Never Used    History  Alcohol Use No     Allergies  Allergen Reactions  . No Known Allergies     Current Outpatient Prescriptions  Medication Sig Dispense Refill  . aspirin EC 81 MG tablet Take 81 mg by mouth daily.    Marland Kitchen atorvastatin (LIPITOR) 10 MG tablet Take 10 mg by mouth daily at 6 PM.     . Calcium-Magnesium-Vitamin D (CALCIUM MAGNESIUM PO) Take 1 tablet by mouth daily.    . clopidogrel (PLAVIX) 75 MG tablet Take 75 mg by mouth daily.    . GuaiFENesin (MUCINEX PO) Take 2 tablets by mouth 2 (two) times daily as needed (congestion).    Marland Kitchen lisinopril (PRINIVIL,ZESTRIL) 2.5 MG tablet Take 2.5 mg by mouth daily.    . Multiple Vitamin (MULTIVITAMIN) capsule Take 1 capsule by mouth.    . nitroGLYCERIN (NITROSTAT) 0.4 MG SL tablet Place 0.4 mg under the tongue every 5 (five) minutes as needed for chest pain. CALL 911  IF PAIN PERSISTS    . tamsulosin (FLOMAX) 0.4 MG CAPS capsule Take 0.4 mg by mouth daily.    Marland Kitchen  Zinc 50 MG CAPS Take 50 mg by mouth daily.     No current facility-administered medications for this visit.       Review of Systems:     Cardiac Review of Systems: Y or N  Chest Pain [  n  ]  Resting SOB [n   ] Exertional SOB  [n  ]  Orthopnea Florencio.Farrier  ]   Pedal Edema [  n ]    Palpitations [ n ] Syncope  [ n]   Presyncope [   n]  General Review of Systems: [Y] = yes [  ]=no Constitional: recent weight change n[  ];  Wt loss over the last 3 months [   ] anorexia [  ]; fatigue [  ]; nausea [  ]; night sweats [  ]; fever [  ]; or chills [  ];          Dental: poor dentition[  ]; Last Dentist visit:   Eye : blurred vision [  ]; diplopia [   ]; vision changes [  ];  Amaurosis fugax[  ]; Resp: cough [n  ];  wheezing[ n ];  hemoptysis[ n ]; shortness of breath[ n ]; paroxysmal nocturnal dyspnea[ n ]; dyspnea on exertion[ n ]; or orthopnea[  ];  GI:  gallstones[  ], vomiting[  ];  dysphagia[  ]; melena[  ];  hematochezia [  ]; heartburn[  ];   Hx of  Colonoscopy[  ]; GU: kidney stones [  ]; hematuria[  ];   dysuria [  ];  nocturia[  ];  history of     obstruction [  ]; urinary frequency [  ]             Skin: rash, swelling[  ];, hair loss[  ];  peripheral edema[  ];  or itching[  ]; Musculosketetal: myalgias[  ];  joint swelling[  ];  joint erythema[  ];  joint pain[  ];  back pain[  ];  Heme/Lymph: bruising[n  ];  bleeding[  ];  anemia[  ];  Neuro: TIA[  n];  headaches[  ];  stroke[  ];  vertigo[  ];  seizures[n  ];   paresthesias[  ];  difficulty walkingn[  ];  Psych:depression[  ]; anxiety[  ];  Endocrine: diabetes[y  ];  thyroid dysfunction[ n ];  Immunizations: Flu up to date [  ]; Pneumococcal up to date [  ];  Other:  Physical Exam: BP 124/71   Pulse 82   Resp 16   SpO2 96% Comment: ON RA  PHYSICAL EXAMINATION: General appearance: alert, cooperative and no distress Head: Normocephalic,  without obvious abnormality, atraumatic Neck: no adenopathy, no carotid bruit, no JVD, supple, symmetrical, trachea midline and thyroid not enlarged, symmetric, no tenderness/mass/nodules Lymph nodes: Cervical, supraclavicular, and axillary nodes normal. Resp: clear to auscultation bilaterally Back: symmetric, no curvature. ROM normal. No CVA tenderness. Cardio: regular rate and rhythm, S1, S2 normal, no murmur, click, rub or gallop GI: soft, non-tender; bowel sounds normal; no masses,  no organomegaly and Patient has well healed mid abdominal incision and multiple port sites Extremities: extremities normal, atraumatic, no cyanosis or edema Neurologic: Grossly normal  Diagnostic Studies & Laboratory data:     Recent Radiology Findings:   Ct Chest W Contrast  Result Date: 11/09/2016 CLINICAL DATA:  Current history of colon cancer. EXAM: CT CHEST WITH CONTRAST TECHNIQUE: Multidetector CT imaging of the chest was performed during intravenous contrast administration. CONTRAST:  40mL ISOVUE-300 IOPAMIDOL (ISOVUE-300) INJECTION 61% COMPARISON:  CT scan of May 07, 2016. FINDINGS: Cardiovascular: Atherosclerosis of thoracic aorta is noted without aneurysm or dissection. Coronary artery calcifications are noted. Mediastinum/Nodes: No significant mediastinal mass or adenopathy is noted. Lungs/Pleura: No pneumothorax or pleural effusion is noted. Stable mild bibasilar scarring is noted. New 9 mm nodule is noted in left perihilar region in left upper lobe best seen on image number 82 of series 5. Upper Abdomen: Stable 2.2 cm low density is seen in dome of right hepatic lobe. Musculoskeletal: No significant osseous abnormality is noted. IMPRESSION: Aortic atherosclerosis. Coronary artery calcifications are noted suggesting coronary artery disease. Stable 2.2 cm low density seen in right hepatic lobe concerning for possible metastatic disease. New 9 mm nodule seen in left upper lobe concerning for metastatic  disease. These results will be called to the ordering clinician or representative by the Radiologist Assistant, and communication documented in the PACS or zVision Dashboard. Electronically Signed   By: Marijo Conception, M.D.   On: 11/09/2016 08:43   Mr Abdomen Wwo Contrast  Result Date: 11/09/2016 CLINICAL DATA:  Colon carcinoma. Follow-up indeterminate hepatic lesion seen on previous exams. EXAM: MRI ABDOMEN WITHOUT AND WITH CONTRAST TECHNIQUE: Multiplanar multisequence MR imaging of the abdomen was performed both before and after the administration of intravenous contrast. CONTRAST:  7mL MULTIHANCE GADOBENATE DIMEGLUMINE 529 MG/ML IV SOLN COMPARISON:  MRI on 07/09/2016 and CT on 05/07/2016 FINDINGS: Lower chest: No acute findings. Hepatobiliary: A well-circumscribed hypovascular lesion is again seen in the anterior liver dome near the junction of the right and left hepatic lobes. This measures 1.6 x 2.6 cm on image 38/1502, and is not significantly changed in size compared to prior exams. This shows mild internal enhancement and peripheral rim enhancement, but shows near T2 isointensity to normal hepatic parenchyma. This has nonspecific characteristics. Although interval stability suggests a benign etiology, liver metastasis cannot be excluded. No other liver masses are identified. Pancreas: No mass or inflammatory changes. No evidence of pancreatic ductal dilatation. Incidental note is made of pancreas divisum. Spleen:  Within normal limits in size and appearance. Adrenals/Urinary Tract: No masses identified. Stable tiny benign right renal cyst. No evidence of hydronephrosis. Stomach/Bowel: Visualized portions within the abdomen are unremarkable. Vascular/Lymphatic: No pathologically enlarged lymph nodes identified. No abdominal aortic aneurysm demonstrated. Other:  None. Musculoskeletal:  No suspicious bone lesions identified. IMPRESSION: Stable 2.6 cm hypovascular lesion in the anterior liver dome, which  has indeterminate characteristics. Although this remains stable compared to recent studies, liver metastasis cannot definitely be excluded. This is in a difficult location to perform percutaneous biopsy ; consider continued followup by MRI in 6 months. No other liver lesions or evidence of metastatic disease within the abdomen. Electronically Signed   By: Earle Gell M.D.   On: 11/09/2016 10:14     I have independently reviewed the above radiologic studies.  Recent Lab Findings: Lab Results  Component Value Date   WBC 8.5 11/27/2016   HGB 14.9 11/27/2016   HCT 44.2 11/27/2016   PLT 210 11/27/2016   GLUCOSE 116 (H) 11/27/2016   CHOL 150 10/23/2016   TRIG 211 (H) 10/23/2016   HDL 42 10/23/2016   LDLCALC 66 10/23/2016   ALT 32 11/27/2016   AST 24 11/27/2016   NA 137 11/27/2016   K 3.9 11/27/2016   CL 105 11/27/2016   CREATININE 0.73 11/27/2016   BUN 12 11/27/2016   CO2 24 11/27/2016   TSH 1.770 03/07/2015  INR 0.94 11/27/2016   HGBA1C 6.4 (H) 03/07/2015      Assessment / Plan:     1/Stage IV carcinoma the colon, following colon resection and liver resection now with 9 mm nodule in the lingula medially Suspicious for metastatic disease .  2/Stable 2.6 cm hypovascular lesion in the anterior liver dome, which has indeterminate characteristics.- This may be persistent scarred area of the liver   from the previous resection.  3/history of previous anterior myocardial infarction treated acutely with stenting of the LAD 2014 currently on aspirin and Plavix   On review of the patient's current scans and previous history, the lesion in the lingula/ left lung is most likely metastatic colon cancer. I reviewed the Patient the fact that the pathology was negative, but in this situation a negative biopsy is not by any means definitive. The patient has seen [name] and considering options of chemotherapy and/or stereotactic radiotherapy to the left upper lobe lesion and monitoring the  nodal situation.   The postop pneumothorax is completely resolved and is without any sequelae.  I'll see the patient acted his or Dr. [name] requested anytime  [name] MD      [place] [place] [address] Office [phone]   Beeper [phone]  12/06/2016 9:26 AM

## 2016-12-10 ENCOUNTER — Encounter (INDEPENDENT_AMBULATORY_CARE_PROVIDER_SITE_OTHER): Payer: Self-pay

## 2016-12-10 ENCOUNTER — Ambulatory Visit (INDEPENDENT_AMBULATORY_CARE_PROVIDER_SITE_OTHER): Payer: 59 | Admitting: Interventional Cardiology

## 2016-12-10 ENCOUNTER — Encounter: Payer: Self-pay | Admitting: Interventional Cardiology

## 2016-12-10 VITALS — BP 126/78 | HR 76 | Ht 74.0 in | Wt 199.8 lb

## 2016-12-10 DIAGNOSIS — E7849 Other hyperlipidemia: Secondary | ICD-10-CM

## 2016-12-10 DIAGNOSIS — C182 Malignant neoplasm of ascending colon: Secondary | ICD-10-CM

## 2016-12-10 DIAGNOSIS — I25118 Atherosclerotic heart disease of native coronary artery with other forms of angina pectoris: Secondary | ICD-10-CM | POA: Diagnosis not present

## 2016-12-10 DIAGNOSIS — I5032 Chronic diastolic (congestive) heart failure: Secondary | ICD-10-CM

## 2016-12-10 DIAGNOSIS — E784 Other hyperlipidemia: Secondary | ICD-10-CM

## 2016-12-10 NOTE — Progress Notes (Signed)
Cardiology Office Note    Date:  12/10/2016   ID:  Allen Glenn, DOB [birth year], MRN [id number]  PCP:  [name], MD  Cardiologist: Sinclair Grooms, MD   Chief Complaint  Patient presents with  . Coronary Artery Disease    History of Present Illness:  Allen Glenn is a 60 y.o. 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  He is having progression of metastatic colon cancer. He has no cardiac complaints. Had a recent lung biopsy with pneumothorax. Plavix was discontinued for week prior to the procedure and started 4 days after the procedure. No bleeding complications on Plavix.  Past Medical History:  Diagnosis Date  . Acute MI 2014   acute ST elevation MI  . Anemia   . Colon cancer ([place]) 06/13/2016   Status post resection of colon mass is well as he panic metastasis.   . Coronary artery disease   . Enlarged prostate    slightly and takes Flomax daily  . Heart disease   . History of blood transfusion   . Hyperlipidemia    takes Lipitor daily  . Hypertension    takes Lisinopril daily  . Lung nodule    left  . Nocturia   . Numbness    left foot  . Peripheral neuropathy [facility])     Past Surgical History:  Procedure Laterality Date  . 1/8 of liver removed    . APPENDECTOMY    . COLONOSCOPY    . CORONARY ANGIOPLASTY     1 stent  . CORONARY STENT PLACEMENT  06-27-2013  . HERNIA REPAIR Left 1991  . LUNG BIOPSY Left 11/28/2016   Procedure: LUNG BIOPSY, left upper lobe;  Surgeon: [name], MD;  Location: [facility];  Service: Thoracic;  Laterality: Left;  . partial coloectomy    . VIDEO BRONCHOSCOPY WITH ENDOBRONCHIAL NAVIGATION N/A 11/28/2016   Procedure: VIDEO BRONCHOSCOPY WITH ENDOBRONCHIAL NAVIGATION;  Surgeon: [name], MD;  Location: [facility];  Service: Thoracic;  Laterality: N/A;    . VIDEO BRONCHOSCOPY WITH ENDOBRONCHIAL ULTRASOUND N/A 11/28/2016   Procedure: VIDEO BRONCHOSCOPY WITH ENDOBRONCHIAL ULTRASOUND;  Surgeon: [name], MD;  Location: [facility];  Service: Thoracic;  Laterality: N/A;    Current Medications: Outpatient Medications Prior to Visit  Medication Sig Dispense Refill  . aspirin EC 81 MG tablet Take 81 mg by mouth daily.    Marland Kitchen atorvastatin (LIPITOR) 10 MG tablet Take 10 mg by mouth daily at 6 PM.     . Calcium-Magnesium-Vitamin D (CALCIUM MAGNESIUM PO) Take 1 tablet by mouth daily.    . GuaiFENesin (MUCINEX PO) Take 2 tablets by mouth 2 (two) times daily as needed (congestion).    Marland Kitchen lisinopril (PRINIVIL,ZESTRIL) 2.5 MG tablet Take 2.5 mg by mouth daily.    . Multiple Vitamin (MULTIVITAMIN) capsule Take 1 capsule by mouth daily.     . nitroGLYCERIN (NITROSTAT) 0.4 MG SL tablet Place 0.4 mg under the tongue every 5 (five) minutes as needed for chest pain. CALL 911 IF PAIN PERSISTS    . tamsulosin (FLOMAX) 0.4 MG CAPS capsule Take 0.4 mg by mouth daily.    . Zinc 50 MG CAPS Take 50 mg by mouth daily.    . clopidogrel (PLAVIX) 75 MG tablet Take 75 mg by mouth daily.     No facility-administered medications  prior to visit.      Allergies:   No known allergies   Social History   Social History  . Marital status: Married    Spouse name: [name]  . Number of children: 2  . Years of education: Bachelor   Occupational History  . Minister    Social History Main Topics  . Smoking status: Never Smoker  . Smokeless tobacco: Never Used  . Alcohol use No  . Drug use: No  . Sexual activity: Not Asked   Other Topics Concern  . None   Social History Narrative   Live at home with wife, [name]   Patient is a [employer] manager, active and goes to gym   Caffeine: very little soda, occasional tea. 12oz-16oz/per     Family History:  The patient's family history includes Heart disease in his father and mother; Hypertension in his father and mother.   ROS:    Please see the history of present illness.    He has no complaints. A spot is been found in his lungs on chest CT. This led to lung biopsy. It was negative but they are still suspicious that it is metastatic.  All other systems reviewed and are negative.   PHYSICAL EXAM:   VS:  BP 126/78 (BP Location: Left Arm)   Pulse 76   Ht 6\' 2"  (1.88 m)   Wt 199 lb 12.8 oz (90.6 kg)   BMI 25.65 kg/m    GEN: Well nourished, well developed, in no acute distress  HEENT: normal  Neck: no JVD, carotid bruits, or masses Cardiac: RRR; no murmurs, rubs, or gallops,no edema  Respiratory:  clear to auscultation bilaterally, normal work of breathing GI: soft, nontender, nondistended, + BS MS: no deformity or atrophy  Skin: warm and dry, no rash Neuro:  Alert and Oriented x 3, Strength and sensation are intact Psych: euthymic mood, full affect  Wt Readings from Last 3 Encounters:  12/10/16 199 lb 12.8 oz (90.6 kg)  12/06/16 202 lb (91.6 kg)  12/04/16 202 lb 4.8 oz (91.8 kg)      Studies/Labs Reviewed:   EKG:  EKG  Not repeated  Recent Labs: 11/27/2016: ALT 32; BUN 12; Creatinine, Ser 0.73; Hemoglobin 14.9; Platelets 210; Potassium 3.9; Sodium 137   Lipid Panel    Component Value Date/Time   CHOL 150 10/23/2016 0807   TRIG 211 (H) 10/23/2016 0807   HDL 42 10/23/2016 0807   CHOLHDL 3.6 10/23/2016 0807   VLDL 42 (H) 10/23/2016 0807   LDLCALC 66 10/23/2016 0807    Additional studies/ records that were reviewed today include:  No new data    ASSESSMENT:    1. Coronary artery disease of native artery of native heart with stable angina pectoris ([place])   2. Chronic diastolic heart failure (HCC)   3. Other hyperlipidemia   4. Malignant neoplasm of ascending colon (HCC)      PLAN:  In order of problems listed above:  1. He has mid LAD drug-eluting stent placed in 2014. There are no cardiopulmonary complaints. We will discontinue Plavix. He should continue baby aspirin daily. 2. No  evidence of volume overload. 3. Continue statin therapy. 4. Metastatic. Now being followed by [name].    Medication Adjustments/Labs and Tests Ordered: Current medicines are reviewed at length with the patient today.  Concerns regarding medicines are outlined above.  Medication changes, Labs and Tests ordered today are listed in the Patient Instructions below. Patient Instructions  Medication Instructions:  1)  DISCONTINUE Plavix  Labwork: None  Testing/Procedures: None  Follow-Up: Your physician wants you to follow-up in: 9-12 months with Dr. Tamala Julian.  You will receive a reminder letter in the mail two months in advance. If you don't receive a letter, please call our office to schedule the follow-up appointment.   Any Other Special Instructions Will Be Listed Below (If Applicable).     If you need a refill on your cardiac medications before your next appointment, please call your pharmacy.      Signed, Sinclair Grooms, MD  12/10/2016 11:46 AM    [facility] [place], [place], [place]  [address] Phone: [phone]; Fax: [phone]

## 2016-12-10 NOTE — Patient Instructions (Signed)
Medication Instructions:  1) DISCONTINUE Plavix  Labwork: None  Testing/Procedures: None  Follow-Up: Your physician wants you to follow-up in: 9-12 months with Dr. Tamala Julian.  You will receive a reminder letter in the mail two months in advance. If you don't receive a letter, please call our office to schedule the follow-up appointment.   Any Other Special Instructions Will Be Listed Below (If Applicable).     If you need a refill on your cardiac medications before your next appointment, please call your pharmacy.

## 2016-12-11 ENCOUNTER — Institutional Professional Consult (permissible substitution): Payer: 59 | Admitting: Pulmonary Disease

## 2016-12-11 DIAGNOSIS — J019 Acute sinusitis, unspecified: Secondary | ICD-10-CM | POA: Diagnosis not present

## 2016-12-11 MED FILL — levoFLOXacin 500 MG TABS: 500 | 10 days supply | Qty: 10 | Fill #0

## 2016-12-12 MED FILL — LISINOPRIL 2.5 MG TABLET: 2.5 | 90 days supply | Qty: 90 | Fill #1

## 2016-12-13 NOTE — Progress Notes (Signed)
GI Location of Tumor / Histology: Metastatic colon cancer   Progression,    Now Biopsy  Left lingula nodule  Level 4 node no endobronchial lesions  Allen Glenn presented months ago with symptoms of: none  Biopsies of Diagnosis 11/28/2016: BRONCHIAL WASHING (D), LEFT LUNG (SPECIMEN 4 OF 4 COLLECTED 11-28-2016) NO MALIGNANT CELLS IDENTIFIED Diagnosis FINE NEEDLE ASPIRATION, ENDOSCOPIC EBUS (C), 4L NODE (SPECIMEN 3 OF 4 COLLECTED 11-28-2016) NO MALIGNANT CELLS IDENTIFIED Diagnosis TRANSBRONCHIAL NEEDLE ASPIRATION SPECIMEN B, NAVIGATION, LEFT LINGULAR MASS BRUSHING #2 (SPECIMEN 2 OF 4, COLLECTED ON 11/28/2016): NO MALIGNANT CELLS IDENTIFIED Diagnosis Lung, biopsy, Left Lingular - BENIGN LUNG PARENCHYMA WITH MILD INFLAMMATION, FIBROSIS, AND ANTHRACOSIS. - THERE IS NO EVIDENCE OF MALIGNANCY Diagnosis TRANSBRONCHIAL NEEDLE ASPIRATION SPECIMEN A, NAVIGATION, LEFT LINGULAR MASS BRUSHING (SPECIMEN 1 OF 4, COLLECTED ON 11/28/2016): NO MALIGNANT CELLS IDENTIFIED   Past/Anticipated interventions by surgeon, if any: Dr. Servando Snare  EBUS /bronchoscopy biopsy 11/28/2016 05/04/2015 Right colectomy and liver bx=liver mets,       Past/Anticipated interventions by medical oncology, if any: [name] note 12/04/16 , FOLFOX  chemotherapy,and oxlip latin, completed February 2017 Referral to Dr. Lisbeth Renshaw to consider SRS to the lingula nodule,but not curative if chest lymph nodes harbor metastatic disease, recommend observation    Weight changes, if any: no  Bowel/Bladder complaints, if any: normal bowels and bladder, last colonoscopy May 2017, clear   Nausea / Vomiting, if any: no  Pain issues, if any:  no   SAFETY ISSUES: yes  Prior radiation? NO  Pacemaker/ICD? NO  Is the patient on methotrexate? NO  Current Complaints/Details:Gi Conference,  Married, Company secretary, disabled, HX MI, with stent, 214, coronary angioplasty,  1/8 of liver removed , enlarged prostate Paternal grandmother cancer,  maternal cousin pancreas cancer, BP 137/72 (BP Location: Right Arm, Patient Position: Sitting, Cuff Size: Normal)   Pulse 72   Temp 97.6 F (36.4 C) (Oral)   Resp 20   Ht 6\' 2"  (1.88 m)   Wt 200 lb 9.6 oz (91 kg)   SpO2 100% Comment: room air  BMI 25.76 kg/m   Wt Readings from Last 3 Encounters:  12/20/16 200 lb 9.6 oz (91 kg)  12/10/16 199 lb 12.8 oz (90.6 kg)  12/06/16 202 lb (91.6 kg)

## 2016-12-20 ENCOUNTER — Ambulatory Visit
Admission: RE | Admit: 2016-12-20 | Discharge: 2016-12-20 | Disposition: A | Discharge status: Home / Self Care | Payer: 59 | Source: Ambulatory Visit | Attending: Radiation Oncology | Admitting: Radiation Oncology

## 2016-12-20 ENCOUNTER — Encounter: Payer: Self-pay | Admitting: Radiation Oncology

## 2016-12-20 VITALS — BP 137/72 | HR 72 | Temp 97.6°F | Resp 20 | Ht 74.0 in | Wt 200.6 lb

## 2016-12-20 DIAGNOSIS — C787 Secondary malignant neoplasm of liver and intrahepatic bile duct: Secondary | ICD-10-CM | POA: Insufficient documentation

## 2016-12-20 DIAGNOSIS — Z9049 Acquired absence of other specified parts of digestive tract: Secondary | ICD-10-CM | POA: Diagnosis not present

## 2016-12-20 DIAGNOSIS — I10 Essential (primary) hypertension: Secondary | ICD-10-CM | POA: Insufficient documentation

## 2016-12-20 DIAGNOSIS — I252 Old myocardial infarction: Secondary | ICD-10-CM | POA: Diagnosis not present

## 2016-12-20 DIAGNOSIS — Z9221 Personal history of antineoplastic chemotherapy: Secondary | ICD-10-CM | POA: Insufficient documentation

## 2016-12-20 DIAGNOSIS — Z9889 Other specified postprocedural states: Secondary | ICD-10-CM | POA: Diagnosis not present

## 2016-12-20 DIAGNOSIS — Z955 Presence of coronary angioplasty implant and graft: Secondary | ICD-10-CM | POA: Insufficient documentation

## 2016-12-20 DIAGNOSIS — C182 Malignant neoplasm of ascending colon: Secondary | ICD-10-CM | POA: Insufficient documentation

## 2016-12-20 DIAGNOSIS — Z79899 Other long term (current) drug therapy: Secondary | ICD-10-CM | POA: Diagnosis not present

## 2016-12-20 DIAGNOSIS — G629 Polyneuropathy, unspecified: Secondary | ICD-10-CM | POA: Diagnosis not present

## 2016-12-20 DIAGNOSIS — Z510 Encounter for antineoplastic radiation therapy: Secondary | ICD-10-CM | POA: Diagnosis not present

## 2016-12-20 DIAGNOSIS — Z7982 Long term (current) use of aspirin: Secondary | ICD-10-CM | POA: Diagnosis not present

## 2016-12-20 DIAGNOSIS — C7802 Secondary malignant neoplasm of left lung: Secondary | ICD-10-CM

## 2016-12-20 DIAGNOSIS — Z8249 Family history of ischemic heart disease and other diseases of the circulatory system: Secondary | ICD-10-CM | POA: Insufficient documentation

## 2016-12-20 DIAGNOSIS — E785 Hyperlipidemia, unspecified: Secondary | ICD-10-CM | POA: Diagnosis not present

## 2016-12-20 DIAGNOSIS — I2510 Atherosclerotic heart disease of native coronary artery without angina pectoris: Secondary | ICD-10-CM | POA: Insufficient documentation

## 2016-12-20 DIAGNOSIS — N400 Enlarged prostate without lower urinary tract symptoms: Secondary | ICD-10-CM | POA: Insufficient documentation

## 2016-12-20 DIAGNOSIS — C7800 Secondary malignant neoplasm of unspecified lung: Secondary | ICD-10-CM | POA: Insufficient documentation

## 2016-12-20 DIAGNOSIS — Z85038 Personal history of other malignant neoplasm of large intestine: Secondary | ICD-10-CM | POA: Diagnosis not present

## 2016-12-20 NOTE — Progress Notes (Signed)
Radiation Oncology         ([phone]) [phone] ________________________________  Name: Allen Glenn MRN: [id number]  Date: 12/20/2016  DOB: 04/16/1956  [id number], Allen Main, MD  Allen Pier, MD     REFERRING PHYSICIAN: Ladell Pier, MD   DIAGNOSIS: The encounter diagnosis was Malignant neoplasm metastatic to left lung [facility]).  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 No BRAF mutation  HISTORY OF PRESENT ILLNESS::Allen Glenn is a 60 y.o. male who is seen for an initial consultation visit regarding the patient's diagnosis of metastatic colon cancer.  The patient was found to have severe anemia when he underwent pre-procedure labs for a cardiac catheterization. He developed chest pain and malaise prior to this pre-procedure and was found to have a hemoglobin of 6.9. He was transfused with red blood cells and received IV iron. A colonoscopy on 04/25/2015 revealed a mass in the right colon and he was subsequently referred to Dr. Morton Glenn. CT images revealed a solitary 2.9 cm liver metastasis.  He was taken to operating room 05/04/2015 and underwent a right colectomy and liver biopsy. The pathology revealed moderately differentiated adenocarcinoma of the right colon. The tumor invaded into the subserosal adipose tissue and lymphovascular invasion was present. Metastatic carcinoma was identified in 7 of 22 lymph nodes. The liver biopsy was consistent with metastatic adenocarcinoma from a colon primary. One of 2 additional ileocolic lymph nodes contained metastatic carcinoma. He was seen by medical oncology and a PET scan on 06/08/2015 revealed an isolated segment 4A liver metastasis. No other evidence of metastatic disease was noted.  He was treated with FOLFOX beginning 06/13/2015. Restaging CTs after 4 cycles revealed an interval decrease in the size of the hepatic  metastasis. No new lesions. A borderline enlarged ileocolic node decreased in size. He completed a fifth cycle of FOLFOX and was referred to Dr. Carlis Glenn for a liver resection procedure. He was taken the operating room for a laparoscopic assisted left partial hepatectomy on 09/28/2015. The pathology returned consistent with metastatic colon cancer. The resection margin was negative.  He restarted adjuvant FOLFOX 11/08/2015 with oxaliplatin eliminated beginning 11/22/2015 secondary to neuropathy. He completed adjuvant therapy on 02/16/2016.  A restaging chest CT on 11/08/2016 compared to 05/07/2016 revealed a new 0.9 cm nodule in the left upper lobe. A stable 2.2 cm density was seen in the right hepatic lobe. A PET scan 11/19/2016 confirmed a hypermetabolic infrahilar lingular nodule, left peritracheal node, and a hypermetabolic pre-cardiac lymph node, new from 05/07/2016. Hypermetabolism was also associated with a hypoattenuating lesion in the dome of the liver.  He was referred to Dr. Servando Glenn and underwent a bronchoscopy and EBUS biopsy on 11/28/2016. A left lingular nodule was biopsied as was a level 4L node. The biopsies from the left lingula revealed no evidence of malignancy. Biopsy of the level 4L node revealed no malignant cells. A left lung bronchial washing was negative.  Allen Glenn decided to transfer his oncology care to [facility] and had a consultation with Dr. Benay Glenn on 12/04/2016. Dr. Benay Glenn discussed the PET scan and that the findings suggest progression of the metastatic colon cancer. The differential diagnosis could be a new lung primary, but the patient was never a smoker. He recommended FOLFIRI/Avastin if the patient required systemic therapy in the future, but observation for now and a three month interval follow-up CT.  The patient has been referred today to discuss stereotactic  body radiation therapy (SBRT) to the lingular nodule. He presents today with his wife. The patient's  case was presented in GI tumor conference.  PREVIOUS RADIATION THERAPY: No   PAST MEDICAL HISTORY:  has a past medical history of Acute MI (2014); Anemia; Colon cancer ([place]) (06/13/2016); Coronary artery disease; Enlarged prostate; Heart disease; History of blood transfusion; Hyperlipidemia; Hypertension; Lung nodule; Nocturia; Numbness; and Peripheral neuropathy ([place]).     PAST SURGICAL HISTORY: Past Surgical History:  Procedure Laterality Date  . 1/8 of liver removed    . APPENDECTOMY    . COLONOSCOPY    . CORONARY ANGIOPLASTY     1 stent  . CORONARY STENT PLACEMENT  06-27-2013  . HERNIA REPAIR Left 1991  . LUNG BIOPSY Left 11/28/2016   Procedure: LUNG BIOPSY, left upper lobe;  Surgeon: Allen Isaac, MD;  Location: [place];  Service: Thoracic;  Laterality: Left;  . partial coloectomy    . VIDEO BRONCHOSCOPY WITH ENDOBRONCHIAL NAVIGATION N/A 11/28/2016   Procedure: VIDEO BRONCHOSCOPY WITH ENDOBRONCHIAL NAVIGATION;  Surgeon: Allen Isaac, MD;  Location: [facility] OR;  Service: Thoracic;  Laterality: N/A;  . VIDEO BRONCHOSCOPY WITH ENDOBRONCHIAL ULTRASOUND N/A 11/28/2016   Procedure: VIDEO BRONCHOSCOPY WITH ENDOBRONCHIAL ULTRASOUND;  Surgeon: Allen Isaac, MD;  Location: [place];  Service: Thoracic;  Laterality: N/A;     FAMILY HISTORY: family history includes Heart disease in his father and mother; Hypertension in his father and mother.   SOCIAL HISTORY:  reports that he has never smoked. He has never used smokeless tobacco. He reports that he does not drink alcohol or use drugs.   ALLERGIES: No known allergies   MEDICATIONS:  Current Outpatient Prescriptions  Medication Sig Dispense Refill  . aspirin EC 81 MG tablet Take 81 mg by mouth daily.    Marland Kitchen atorvastatin (LIPITOR) 10 MG tablet Take 10 mg by mouth daily at 6 PM.     . levofloxacin (LEVAQUIN) 500 MG tablet Take 500 mg by mouth daily.    Marland Kitchen lisinopril (PRINIVIL,ZESTRIL) 2.5 MG tablet Take 2.5 mg by mouth daily.    .  tamsulosin (FLOMAX) 0.4 MG CAPS capsule Take 0.4 mg by mouth daily.    . Calcium-Magnesium-Vitamin D (CALCIUM MAGNESIUM PO) Take 1 tablet by mouth daily.    . GuaiFENesin (MUCINEX PO) Take 2 tablets by mouth 2 (two) times daily as needed (congestion).    . Multiple Vitamin (MULTIVITAMIN) capsule Take 1 capsule by mouth daily.     . nitroGLYCERIN (NITROSTAT) 0.4 MG SL tablet Place 0.4 mg under the tongue every 5 (five) minutes as needed for chest pain. CALL 911 IF PAIN PERSISTS    . Zinc 50 MG CAPS Take 50 mg by mouth daily.     No current facility-administered medications for this encounter.      REVIEW OF SYSTEMS:  A 15 point review of systems is documented in the electronic medical record. This was obtained by the nursing staff. However, I reviewed this with the patient to discuss relevant findings and make appropriate changes.  Pertinent items noted in HPI and remainder of comprehensive ROS otherwise negative.   The patient states he feels that he is doing well. Denies abdominal pain or difficulty breathing. He fells better now that he had in years. States chemo was "ok" and that the side effects were manageable.  PHYSICAL EXAM:  height is 6' 2"  (1.88 m) and weight is 200 lb 9.6 oz (91 kg). His oral temperature is 97.6 F (36.4 C).  His blood pressure is 137/72 and his pulse is 72. His respiration is 20 and oxygen saturation is 100%.   General: Well-developed, in no acute distress HEENT: Normocephalic, atraumatic; oral cavity clear Neck: Supple without any lymphadenopathy Cardiovascular: Regular rate and rhythm Respiratory: Clear to auscultation bilaterally GI: Soft, nontender, normal bowel sounds Extremities: No edema present Neuro: No focal deficits  ECOG = 0  0 - Asymptomatic (Fully active, able to carry on all predisease activities without restriction)  1 - Symptomatic but completely ambulatory (Restricted in physically strenuous activity but ambulatory and able to carry out work  of a light or sedentary nature. For example, light housework, office work)  2 - Symptomatic, <50% in bed during the day (Ambulatory and capable of all self care but unable to carry out any work activities. Up and about more than 50% of waking hours)  3 - Symptomatic, >50% in bed, but not bedbound (Capable of only limited self-care, confined to bed or chair 50% or more of waking hours)  4 - Bedbound (Completely disabled. Cannot carry on any self-care. Totally confined to bed or chair)  5 - Death   Eustace Pen MM, Creech RH, Tormey DC, et al. [phone]). "Toxicity and response criteria of the [facility]". [place] Oncol. 5 (6): 649-55   LABORATORY DATA:  Lab Results  Component Value Date   WBC 8.5 11/27/2016   HGB 14.9 11/27/2016   HCT 44.2 11/27/2016   MCV 89.3 11/27/2016   PLT 210 11/27/2016   Lab Results  Component Value Date   NA 137 11/27/2016   K 3.9 11/27/2016   CL 105 11/27/2016   CO2 24 11/27/2016   Lab Results  Component Value Date   ALT 32 11/27/2016   AST 24 11/27/2016   ALKPHOS 72 11/27/2016   BILITOT 0.6 11/27/2016      RADIOGRAPHY: Dg Chest 2 View  Result Date: 12/06/2016 CLINICAL DATA:  Pulmonary nodule, history of lung biopsy EXAM: CHEST  2 VIEW COMPARISON:  11/29/2016 FINDINGS: Normal heart size, mediastinal contours, and pulmonary vascularity. Atherosclerotic calcification aorta. Lungs demonstrate emphysematous changes but are clear. Resolved LEFT basilar atelectasis since previous exam. Resolved LEFT apex pneumothorax since previous exam. No acute infiltrate, pleural effusion or pneumothorax. No acute osseous findings. IMPRESSION: Resolution of previously identified LEFT apex pneumothorax and LEFT bibasilar atelectasis. Electronically Signed   By: Lavonia Dana M.D.   On: 12/06/2016 08:44   Dg Chest 2 View  Result Date: 11/29/2016 CLINICAL DATA:  Left lung biopsy yesterday, followup of left-sided pneumothorax EXAM: CHEST  2 VIEW  COMPARISON:  Chest x-ray of 11/28/2016 FINDINGS: The volume of the left apical pneumothorax has not changed significantly. Bibasilar atelectasis remains. Heart size is stable. IMPRESSION: No change in left apical pneumothorax. Electronically Signed   By: Ivar Drape M.D.   On: 11/29/2016 08:01   X-ray Chest Pa And Lateral  Result Date: 11/28/2016 CLINICAL DATA:  Follow-up left pneumothorax. EXAM: CHEST  2 VIEW COMPARISON:  Earlier this day at 1221 hour FINDINGS: Left pneumothorax has decreased in size, approximately 15-20% currently. Basilar component is no longer visualized. Left lung base atelectasis again seen. No mediastinal shift. Right lung is clear. Normal heart size. IMPRESSION: Decreasing size of moderate left pneumothorax since exam 6 hours earlier. Electronically Signed   By: Jeb Levering M.D.   On: 11/28/2016 19:54   Dg Chest 2 View  Result Date: 11/28/2016 CLINICAL DATA:  Shortness of breath, cough. EXAM: CHEST  2 VIEW COMPARISON:  11/28/2016 FINDINGS: Left pneumothorax has enlarged somewhat since prior study. There is left base atelectasis. Right lung is clear. Heart is normal size. IMPRESSION: Enlarging left pneumothorax, now approximately 25-30%. Critical Value/emergent results were called by telephone at the time of interpretation on 11/28/2016 at 12:32 pm to Dr. Lanelle Bal , who verbally acknowledged these results. Electronically Signed   By: Rolm Baptise M.D.   On: 11/28/2016 12:36   Dg Chest 2 View  Result Date: 11/27/2016 CLINICAL DATA:  Pre admission prior to lung biopsy EXAM: CHEST  2 VIEW COMPARISON:  PET-CT November 19, 2016 FINDINGS: The heart size and mediastinal contours are within normal limits. Both lungs are clear. The PET-CT noted a hypermetabolic nodule in the left lung is not seen on the chest x-ray. Degenerative joint changes of the spine are noted. IMPRESSION: No active cardiopulmonary disease. Electronically Signed   By: Abelardo Diesel M.D.   On: 11/27/2016  14:08   Ct Chest Wo Contrast  Result Date: 11/27/2016 CLINICAL DATA:  Known left upper lobe mass lesion. EXAM: CT CHEST WITHOUT CONTRAST TECHNIQUE: Multidetector CT imaging of the chest was performed following the standard protocol without IV contrast. COMPARISON:  11/08/2016, 11/19/2016 FINDINGS: Cardiovascular: Somewhat limited with the lack of IV contrast. Aortic calcifications are seen without aneurysmal dilatation. Coronary calcifications are noted. Mediastinum/Nodes: The thoracic inlet is within normal limits. There is again noted a pericardial lymph node best seen on image number 104 series 3 which measures 17 by 10 mm in greatest dimension. This is stable from the recent PET-CT. Few small lymph nodes are noted adjacent to the carina and in the precarinal region which are stable from recent CT. No hilar adenopathy is noted. Lungs/Pleura: The lungs are again well aerated without focal infiltrate or sizable effusion. A left upper lobe nodule is again identified measuring 1 cm in greatest dimension. This corresponds to hypermetabolic lesions seen on recent PET-CT. No other focal nodules are noted. Upper Abdomen: The hypodense lesion in the anterior aspect of the liver is again visualized but less well so due to the lack of IV contrast. The remainder the upper abdomen appears within normal limits. Musculoskeletal: Degenerative changes of the thoracic spine are noted. IMPRESSION: Stable mediastinal and pericardial lymph nodes. Stable 1 cm left upper lobe nodule. Stable but less well visualized hypodense lesion within the liver. No new focal abnormality is seen. Electronically Signed   By: Inez Catalina M.D.   On: 11/27/2016 15:03   Dg Chest Port 1 View  Result Date: 11/28/2016 CLINICAL DATA:  Post bronchoscopy with biopsy. EXAM: PORTABLE CHEST 1 VIEW COMPARISON:  11/27/2016 FINDINGS: Left base opacity, likely atelectasis. Right lung is clear. Heart is normal size. There is moderate-sized left pneumothorax  noted at the left base and over the left upper lobe/apex. This is approximately 15-20%. IMPRESSION: Moderate-sized left pneumothorax, 15-20%. These results will be called to the ordering clinician or representative by the Radiologist Assistant, and communication documented in the PACS or zVision Dashboard. Electronically Signed   By: Rolm Baptise M.D.   On: 11/28/2016 11:21       IMPRESSION: Stage IV (pT3, pN2b, M1) moderately differentiated adenocarcinoma of the right colon, status post a right colectomy 05/04/2015, Foundation 1 testing-MSI-stable, K-ras G12C mutations. No BRAF mutation  We discussed that it is likely the suspicious lingular nodule is metastatic disease, even though the biopsies were non-diagnostic. SBRT might be an option since it is small. However, the patient also has a hypermetabolic left peritracheal node and  a hypermetabolic pre-cardiac lymph node. Providing coverage to the single lingular nodule would provide some benefit, but not as well if all were treated. If the other nodes were also treated, then the patient would have 5 fractions of SBRT.  We also discussed that if I provided radiation to all of these areas and Dr. Benay Glenn then decides to provide systemic treatment, then it would be difficult to tell if one or the other affected the disease if the spots decreased in size. It would be helpful to coordinate with Dr. Benay Glenn on how radiation would benefit in terms of working around systemic treatment if it's given.  PLAN: The patient will have a CT scan scheduled by Dr. Benay Glenn in January. I will coordinate with Dr. Benay Glenn and further consider SBRT to the lung after his next scan. He will follow-up therefore after his next set of scans - tentatively scheduled in 6 weeks but this can be changed if necessary. The patient expressed a possible interest in SBRT, and ideally would be interested in treatment to lymph nodes as well. This may be feasible, but would likely require a  longer course of radiation than an anticipated 5 fraction course to the left lung tumor. ________________________________   Jodelle Gross, MD, PhD  This document serves as a record of services personally performed by Kyung Rudd, MD. It was created on his behalf by Darcus Austin, a trained medical scribe. The creation of this record is based on the scribe's personal observations and the provider's statements to them. This document has been checked and approved by the attending provider.

## 2016-12-20 NOTE — Addendum Note (Signed)
Encounter addended by: Doreen Beam, RN on: 12/20/2016 10:13 AM<BR>    Actions taken: Visit diagnoses modified

## 2016-12-20 NOTE — Progress Notes (Signed)
Please see the Nurse Progress Note in the MD Initial Consult Encounter for this patient. 

## 2016-12-20 NOTE — Addendum Note (Signed)
Encounter addended by: Doreen Beam, RN on: 12/20/2016 10:13 AM<BR>    Actions taken: Charge Capture section accepted

## 2016-12-26 ENCOUNTER — Ambulatory Visit (HOSPITAL_BASED_OUTPATIENT_CLINIC_OR_DEPARTMENT_OTHER): Payer: 59 | Admitting: Oncology

## 2016-12-26 ENCOUNTER — Telehealth: Payer: Self-pay | Admitting: Oncology

## 2016-12-26 VITALS — BP 147/68 | HR 79 | Temp 98.2°F | Resp 19 | Ht 74.0 in | Wt 199.9 lb

## 2016-12-26 DIAGNOSIS — R911 Solitary pulmonary nodule: Secondary | ICD-10-CM | POA: Diagnosis not present

## 2016-12-26 DIAGNOSIS — I2510 Atherosclerotic heart disease of native coronary artery without angina pectoris: Secondary | ICD-10-CM

## 2016-12-26 DIAGNOSIS — E785 Hyperlipidemia, unspecified: Secondary | ICD-10-CM

## 2016-12-26 DIAGNOSIS — C182 Malignant neoplasm of ascending colon: Secondary | ICD-10-CM | POA: Diagnosis not present

## 2016-12-26 DIAGNOSIS — I252 Old myocardial infarction: Secondary | ICD-10-CM

## 2016-12-26 DIAGNOSIS — C787 Secondary malignant neoplasm of liver and intrahepatic bile duct: Secondary | ICD-10-CM | POA: Diagnosis not present

## 2016-12-26 DIAGNOSIS — I10 Essential (primary) hypertension: Secondary | ICD-10-CM

## 2016-12-26 NOTE — Progress Notes (Signed)
  [facility] OFFICE PROGRESS NOTE   Diagnosis: Colon cancer  INTERVAL HISTORY:   Allen Glenn returns as scheduled. He feels well. No complaint. He saw Dr. Lisbeth Renshaw to discuss radiation options.  Objective:  Vital signs in last 24 hours:  Blood pressure (!) 147/68, pulse 79, temperature 98.2 F (36.8 C), temperature source Oral, resp. rate 19, height _0  (1.88 m), weight 199 lb 14.4 oz (90.7 kg), SpO2 100 %.    HEENT: Neck without mass Resp: Lungs clear bilaterally Cardio: Regular rate and rhythm GI: No hepatomegaly, nontender Vascular: No leg edema   Medications: I have reviewed the patient's current medications.  Assessment/Plan: 1. Stage IV (pT3,pN2b,M1) sees moderately differentiated adenocarcinoma of the right colon, status post a right colectomy 05/04/2015, Foundation 1 testing-MSI-stable, K-ras G12C mutations. No BRAF mutation  Liver biopsy 05/04/2015-metastatic adenocarcinoma consistent with a colon primary ? Staging PET scan 06/08/2015-isolated segment 4A liver lesion ? Initiation of adjuvant FOLFOX 06/13/2015 ? Restaging CT 08/09/2015 revealed a slight decrease in a borderline ileocolic node, decrease in the hepatic dome metastasis, no new lesions ? Liver resection 09/28/2015-pathology consistent with metastatic colon cancer, negative margins ? Adjuvant FOLFOX resumed 11/08/2015, oxaliplatin eliminated beginning 11/22/2015 secondary to neuropathy. He completed adjuvant chemotherapy 02/16/2016 ? Restaging chest CT 11/08/2016, compared to 05/07/2016 revealed a new 9 mm left upper lobe nodule, stable 2.2 cm right hepatic lesion ? PET scan 11/19/2016 confirmed a hypermetabolic left upper lobe nodule, hypermetabolic left paratracheal and pericardiac lymph nodes, and hypermetabolism associated with the hypoattenuating lesion in the dome of the liver ? Status post EBUS biopsies of the left lingula nodule and a level 4L node on 11/28/2016-no evidence of  malignancy   2.    Coronary artery disease status post a myocardial infarction in 2014  3.    Hypertension  4.    Hyperlipidemia   Disposition:  [name] appears stable. He understands the high likelihood of the lung nodule and chest lymph nodes representing metastatic colon cancer. There is a very small chance any therapy will be curative. We decided to continue observation and obtain a restaging chest CT 02/11/2017. He will return for an office visit 02/13/2017. We will follow-up on the Foundation 1 results obtained at [place], [place], MD  12/26/2016  9:18 AM

## 2016-12-26 NOTE — Telephone Encounter (Signed)
Appointments scheduled per 12/26/16 los.  Patient was given a copy of the AVS report and appointment schedule, per 12/26/16 los. °

## 2017-01-21 DIAGNOSIS — I10 Essential (primary) hypertension: Secondary | ICD-10-CM | POA: Diagnosis not present

## 2017-01-21 DIAGNOSIS — N400 Enlarged prostate without lower urinary tract symptoms: Secondary | ICD-10-CM | POA: Diagnosis not present

## 2017-01-21 DIAGNOSIS — I2510 Atherosclerotic heart disease of native coronary artery without angina pectoris: Secondary | ICD-10-CM | POA: Diagnosis not present

## 2017-01-21 DIAGNOSIS — E782 Mixed hyperlipidemia: Secondary | ICD-10-CM | POA: Diagnosis not present

## 2017-01-21 DIAGNOSIS — C189 Malignant neoplasm of colon, unspecified: Secondary | ICD-10-CM | POA: Diagnosis not present

## 2017-01-21 DIAGNOSIS — C787 Secondary malignant neoplasm of liver and intrahepatic bile duct: Secondary | ICD-10-CM | POA: Diagnosis not present

## 2017-01-31 ENCOUNTER — Ambulatory Visit: Payer: Self-pay | Admitting: Radiation Oncology

## 2017-02-08 ENCOUNTER — Telehealth: Payer: Self-pay | Admitting: *Deleted

## 2017-02-08 NOTE — Telephone Encounter (Signed)
Message from [place] at [facility]. Pt needs BUN and creatinine for chest CT on 2/19. Reviewed with [name]: Verbal order given for BUN and creatinine to be done at [facility]. No other labs needed.

## 2017-02-11 ENCOUNTER — Ambulatory Visit
Admission: RE | Admit: 2017-02-11 | Discharge: 2017-02-11 | Disposition: A | Discharge status: Home / Self Care | Payer: 59 | Source: Ambulatory Visit | Attending: Oncology | Admitting: Oncology

## 2017-02-11 DIAGNOSIS — R911 Solitary pulmonary nodule: Secondary | ICD-10-CM | POA: Diagnosis not present

## 2017-02-11 DIAGNOSIS — C189 Malignant neoplasm of colon, unspecified: Secondary | ICD-10-CM | POA: Diagnosis not present

## 2017-02-11 DIAGNOSIS — C182 Malignant neoplasm of ascending colon: Secondary | ICD-10-CM

## 2017-02-11 MED ORDER — IOPAMIDOL (ISOVUE-300) INJECTION 61%
75.0000 mL | Freq: Once | INTRAVENOUS | Status: AC | PRN
  Administered 2017-02-11: 75 mL via INTRAVENOUS

## 2017-02-11 NOTE — Progress Notes (Signed)
Follow up New Consult   Metastatic Stage IV  Colon to Lung   CT Chest 02/11/17"  FINDINGS: Cardiovascular: The heart size appears normal. No pericardial effusion. Aortic atherosclerosis. Metallic stent within the LAD coronary artery noted.  Mediastinum/Nodes: The trachea appears patent and is midline. Normal appearance of the esophagus. 8 mm left paratracheal lymph node identified. Stable from previous exam. Epicardial lymph node measures 2.6 x 1.0 cm, image 89 of series 3. On the previous exam this measured 1.7 x 0.9 cm.  Lungs/Pleura: There is a 1.2 cm nodule within the central left upper lobe, image 62 of series 4. Previously 1 cm.  Upper Abdomen: Along the anterior dome of the liver is a 2.1 cm low-attenuation, indeterminate structure which is unchanged from 11/08/2016, image 91 of series 3. No acute abnormality identified within the upper abdomen.  Musculoskeletal: There is no aggressive lytic or sclerotic bone lesions identified. Degenerative disc disease identified within the thoracic spine.  IMPRESSION: 1. The pulmonary nodule in the central left upper lobe has increased in size from previous exam. Epicardial lymph node is also increased in size in the interval.   [name] appt 02/13/17 @ 9am Vitals done I Med/Onc 98.3,129/72,61,18 , weight=198.4lb Wt Readings from Last 3 Encounters:  02/13/17 198 lb 4.8 oz (89.9 kg)  12/26/16 199 lb 14.4 oz (90.7 kg)  12/20/16 200 lb 9.6 oz (91 kg)

## 2017-02-13 ENCOUNTER — Ambulatory Visit
Admission: RE | Admit: 2017-02-13 | Discharge: 2017-02-13 | Disposition: A | Discharge status: Home / Self Care | Payer: 59 | Source: Ambulatory Visit | Attending: Radiation Oncology | Admitting: Radiation Oncology

## 2017-02-13 ENCOUNTER — Ambulatory Visit (HOSPITAL_BASED_OUTPATIENT_CLINIC_OR_DEPARTMENT_OTHER): Payer: 59 | Admitting: Oncology

## 2017-02-13 VITALS — BP 129/72 | HR 61 | Temp 98.0°F | Resp 18 | Ht 73.0 in | Wt 198.3 lb

## 2017-02-13 DIAGNOSIS — E785 Hyperlipidemia, unspecified: Secondary | ICD-10-CM | POA: Diagnosis not present

## 2017-02-13 DIAGNOSIS — Z510 Encounter for antineoplastic radiation therapy: Secondary | ICD-10-CM | POA: Diagnosis not present

## 2017-02-13 DIAGNOSIS — C787 Secondary malignant neoplasm of liver and intrahepatic bile duct: Secondary | ICD-10-CM | POA: Diagnosis not present

## 2017-02-13 DIAGNOSIS — I10 Essential (primary) hypertension: Secondary | ICD-10-CM

## 2017-02-13 DIAGNOSIS — C182 Malignant neoplasm of ascending colon: Secondary | ICD-10-CM | POA: Diagnosis not present

## 2017-02-13 DIAGNOSIS — R590 Localized enlarged lymph nodes: Secondary | ICD-10-CM | POA: Diagnosis not present

## 2017-02-13 DIAGNOSIS — Z85038 Personal history of other malignant neoplasm of large intestine: Secondary | ICD-10-CM | POA: Diagnosis not present

## 2017-02-13 DIAGNOSIS — C7800 Secondary malignant neoplasm of unspecified lung: Secondary | ICD-10-CM

## 2017-02-13 DIAGNOSIS — C7802 Secondary malignant neoplasm of left lung: Secondary | ICD-10-CM | POA: Diagnosis not present

## 2017-02-13 DIAGNOSIS — N400 Enlarged prostate without lower urinary tract symptoms: Secondary | ICD-10-CM | POA: Diagnosis not present

## 2017-02-13 DIAGNOSIS — I2510 Atherosclerotic heart disease of native coronary artery without angina pectoris: Secondary | ICD-10-CM | POA: Diagnosis not present

## 2017-02-13 DIAGNOSIS — R911 Solitary pulmonary nodule: Secondary | ICD-10-CM

## 2017-02-13 DIAGNOSIS — I252 Old myocardial infarction: Secondary | ICD-10-CM | POA: Diagnosis not present

## 2017-02-13 DIAGNOSIS — Z9049 Acquired absence of other specified parts of digestive tract: Secondary | ICD-10-CM | POA: Diagnosis not present

## 2017-02-13 NOTE — Progress Notes (Signed)
Please see the Nurse Progress Note in the MD Initial Consult Encounter for this patient. 

## 2017-02-13 NOTE — Progress Notes (Signed)
[place] OFFICE PROGRESS NOTE   Diagnosis: Colon cancer  INTERVAL HISTORY:   Allen Glenn returns as scheduled. He feels well. No complaint. Good appetite and energy level.  Objective:  Vital signs in last 24 hours:  Blood pressure 129/72, pulse 61, temperature 98 F (36.7 C), temperature source Oral, resp. rate 18, height 6' 1"  (1.854 m), weight 198 lb 4.8 oz (89.9 kg), SpO2 99 %.    HEENT: Neck without mass Lymphatics: No cervical, supraclavicular, axillary, or inguinal nodes Resp: Lungs clear bilaterally Cardio: Regular rate and rhythm GI: No hepatosplenomegaly, no mass, nontender Vascular: No leg edema     Imaging:  Ct Chest W Contrast  Result Date: 02/11/2017 CLINICAL DATA:  Followup colon cancer.  Pulmonary nodule. EXAM: CT CHEST WITH CONTRAST TECHNIQUE: Multidetector CT imaging of the chest was performed during intravenous contrast administration. CONTRAST:  29m ISOVUE-300 IOPAMIDOL (ISOVUE-300) INJECTION 61% Creat[place] [facility] [place] [facility] [address] Results: Creatinine 0.6 mg/dL. COMPARISON:  11/27/2016. FINDINGS: Cardiovascular: The heart size appears normal. No pericardial effusion. Aortic atherosclerosis. Metallic stent within the LAD coronary artery noted. Mediastinum/Nodes: The trachea appears patent and is midline. Normal appearance of the esophagus. 8 mm left paratracheal lymph node identified. Stable from previous exam. Epicardial lymph node measures 2.6 x 1.0 cm, image 89 of series 3. On the previous exam this measured 1.7 x 0.9 cm. Lungs/Pleura: There is a 1.2 cm nodule within the central left upper lobe, image 62 of series 4. Previously 1 cm. Upper Abdomen: Along the anterior dome of the liver is a 2.1 cm low-attenuation, indeterminate structure which is unchanged from 11/08/2016, image 91 of series 3. No acute abnormality identified within the upper abdomen. Musculoskeletal: There is no aggressive lytic or  sclerotic bone lesions identified. Degenerative disc disease identified within the thoracic spine. IMPRESSION: 1. The pulmonary nodule in the central left upper lobe has increased in size from previous exam. Epicardial lymph node is also increased in size in the interval. Electronically Signed   By: TKerby MoorsM.D.   On: 02/11/2017 09:50    Medications: I have reviewed the patient's current medications.  Assessment/Plan: 1. 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 No BRAFmutation  Liver biopsy 05/04/2015-metastatic adenocarcinoma consistent with a colon primary ? Staging PET scan 06/08/2015-isolated segment 4A liver lesion ? Initiation of adjuvant FOLFOX 06/13/2015 ? Restaging CT 08/09/2015 revealed a slight decrease in a borderline ileocolic node, decrease in the hepatic dome metastasis, no new lesions ? Liver resection 09/28/2015-pathology consistent with metastatic colon cancer, negative margins ? Adjuvant FOLFOX resumed 11/08/2015, oxaliplatin eliminated beginning 11/22/2015 secondary to neuropathy. He completed adjuvant chemotherapy 02/16/2016 ? Restaging chest CT 11/08/2016, compared to 05/07/2016 revealed a new 9 mm left upper lobe nodule, stable 2.2 cm right hepatic lesion ? PET scan 11/19/2016 confirmed a hypermetabolic left upper lobe nodule, hypermetabolic left paratracheal and pericardiac lymph nodes, and hypermetabolism associated with the hypoattenuating lesion in the dome of the liver ? Status post EBUSbiopsies of the left lingula nodule and a level 4Lnode on 11/28/2016-no evidence of malignancy ? CT chest 02/11/2017-increase in size of the left pulmonary nodule and epicardial lymph node   2. Coronary artery disease status post a myocardial infarction in 2014  3. Hypertension  4.  Hyperlipidemia    Disposition:  Mr. HKinzlerhas metastatic colon cancer. He appears asymptomatic. I reviewed the chest CT  images and discussed treatment options with Allen Glenn and his wife. I explained no therapy will be curative. We discussed observation, systemic therapy, and palliative radiation. I do not recommend chemotherapy at present with his lack of symptoms, the limited tumor burden, and the potential for toxicity with chemotherapy. He will see Dr. Lisbeth Renshaw today to consider the indication for radiation to the left lung nodule and epicardial lymph node. This treatment will not be curative.  Allen Glenn will return for an office visit in 3 months. I will see him sooner as needed. We will schedule a restaging chest CT based on the decision for radiation.  25 minutes were spent with the patient today. The majority of the time was used for counseling and coordination of care.  Betsy Coder, MD  02/13/2017  9:25 AM

## 2017-02-13 NOTE — Progress Notes (Addendum)
Radiation Oncology         [phone]) [phone] ________________________________  Name: Allen Glenn MRN: [id number]  Date: 02/13/2017  DOB: June 26, 1956  [id number], Annie Main, MD  Ladell Pier, MD     REFERRING PHYSICIAN: Ladell Pier, MD   DIAGNOSIS: The primary encounter diagnosis was Malignant neoplasm of ascending colon ([place]). A diagnosis of Malignant neoplasm metastatic to lung, unspecified laterality [facility]) was also pertinent to this visit. Stage IV (pT3, pN2b, M1) moderately differentiated adenocarcinoma of the right colon  HISTORY OF PRESENT ILLNESS:Allen Glenn is a 61 y.o. male with a history of metastatic colon cancer. The patient was diagnosed with stage IV disease in 2016 surgery when he was found to have liver involvement. He has received FOLFOX chemotherapy between the summer of 2016 and the spring of 2017. In November 2017 he underwent surveillance imaging which revealed a new 0.9 cm nodule in the left upper lobe. A stable 2.2 cm density was seen in the right hepatic lobe. A PET scan 11/19/2016 confirmed a hypermetabolic infrahilar lingular nodule, left peritracheal node, and a hypermetabolic pre-cardiac lymph node, new from 05/07/2016. Hypermetabolism was also associated with a hypoattenuating lesion in the dome of the liver. He was referred to Dr. Servando Snare and underwent EBUS with biopsy on 11/28/2016. A left lingular nodule was biopsied as was a level 4L node. The biopsies from the left lingula revealed no evidence of malignancy. Biopsy of the level 4L node revealed no malignant cells. A left lung bronchial washing was negative.  He transferred care to [name] in December 2017, and recommended palliative chemotherapy versus continued observation for now and a three month interval follow-up scans. He elected for surveillance, and had a repeat  CT on 02/11/17. This revealed an increase in size of the pulmonary nodule from 1.0 cm to 1.2 cm, as well as an increase in size of the  epicardial lymph node from 1.7 x 0.9 cm to 2.6 x 1.0 cm. He comes today to discuss the options for treatment.    PREVIOUS RADIATION THERAPY: No   Past Medical History:  Past Medical History:  Diagnosis Date  . Acute MI 2014   acute ST elevation MI  . Anemia   . Colon cancer ([place]) 06/13/2016   Status post resection of colon mass is well as he panic metastasis.   . Coronary artery disease   . Enlarged prostate    slightly and takes Flomax daily  . Heart disease   . History of blood transfusion   . Hyperlipidemia    takes Lipitor daily  . Hypertension    takes Lisinopril daily  . Lung nodule    left  . Nocturia   . Numbness    left foot  . Peripheral neuropathy [facility])     Past Surgical History: Past Surgical History:  Procedure Laterality Date  . 1/8 of liver removed    . APPENDECTOMY    . COLONOSCOPY    . CORONARY ANGIOPLASTY     1 stent  . CORONARY STENT PLACEMENT  06-27-2013  . HERNIA REPAIR Left 1991  . LUNG BIOPSY Left 11/28/2016   Procedure: LUNG BIOPSY, left upper lobe;  Surgeon: [name], MD;  Location: [place];  Service: Thoracic;  Laterality: Left;  . partial coloectomy    . VIDEO BRONCHOSCOPY WITH ENDOBRONCHIAL NAVIGATION N/A 11/28/2016   Procedure: VIDEO BRONCHOSCOPY WITH ENDOBRONCHIAL NAVIGATION;  Surgeon: [name], MD;  Location: [place];  Service: Thoracic;  Laterality: N/A;  . VIDEO  BRONCHOSCOPY WITH ENDOBRONCHIAL ULTRASOUND N/A 11/28/2016   Procedure: VIDEO BRONCHOSCOPY WITH ENDOBRONCHIAL ULTRASOUND;  Surgeon: [name], MD;  Location: [place];  Service: Thoracic;  Laterality: N/A;    Social History:  Social History   Social History  . Marital status: Married    Spouse name: Allen Glenn  . Number of children: 2  . Years of education: Bachelor   Occupational History  . Minister    Social History Main Topics  . Smoking status: Never Smoker  . Smokeless tobacco: Never Used  . Alcohol use No  . Drug use: No  . Sexual activity: Not  on file   Other Topics Concern  . Not on file   Social History Narrative   Live at home with wife, Allen Glenn   Patient is a [employer] manager, active and goes to gym   Caffeine: very little soda, occasional tea. 12oz-16oz/per    Family History: Family History  Problem Relation Age of Onset  . Hypertension Mother   . Heart disease Mother   . Hypertension Father   . Heart disease Father   . Neuropathy Neg Hx     ALLERGIES: No known allergies   MEDICATIONS:  Current Outpatient Prescriptions  Medication Sig Dispense Refill  . aspirin EC 81 MG tablet Take 81 mg by mouth daily.    Marland Kitchen atorvastatin (LIPITOR) 10 MG tablet Take 10 mg by mouth daily at 6 PM.     . Calcium-Magnesium-Vitamin D (CALCIUM MAGNESIUM PO) Take 1 tablet by mouth daily.    Marland Kitchen lisinopril (PRINIVIL,ZESTRIL) 2.5 MG tablet Take 2.5 mg by mouth daily.    . Multiple Vitamin (MULTIVITAMIN) capsule Take 1 capsule by mouth daily.     . nitroGLYCERIN (NITROSTAT) 0.4 MG SL tablet Place 0.4 mg under the tongue every 5 (five) minutes as needed for chest pain. CALL 911 IF PAIN PERSISTS    . tamsulosin (FLOMAX) 0.4 MG CAPS capsule Take 0.4 mg by mouth daily.    . Zinc 50 MG CAPS Take 50 mg by mouth daily.     No current facility-administered medications for this encounter.      REVIEW OF SYSTEMS:  On review of systems, the patient reports that he is doing well overall. He denies any chest pain, shortness of breath, cough, fevers, chills, night sweats, unintended weight changes. He denies any bowel or bladder disturbances, and denies abdominal pain, nausea or vomiting. He denies any new musculoskeletal or joint aches or pains, new skin lesions or concerns. A complete review of systems is obtained and is otherwise negative.   PHYSICAL EXAM:  Vitals with BMI 02/13/2017  Height 6\' 1"   Weight 198 lbs 5 oz  BMI [id number]  Systolic [id number]  Diastolic 72  Pulse 61  Respirations 18  In general this is a well appearing caucasian male in no acute  distress. He is alert and oriented x4 and appropriate throughout the examination. HEENT reveals that the patient is normocephalic, atraumatic. EOMs are intact. PERRLA. Skin is intact without any evidence of gross lesions. Cardiovascular exam reveals a regular rate and rhythm, no clicks rubs or murmurs are auscultated. Chest is clear to auscultation bilaterally. Lymphatic assessment is performed and does not reveal any adenopathy in the cervical, supraclavicular, axillary, or inguinal chains. Abdomen has active bowel sounds in all quadrants and is intact. The abdomen is soft, non tender, non distended. Lower extremities are negative for pretibial pitting edema, deep calf tenderness, cyanosis or clubbing.   ECOG = 0  0 - Asymptomatic (  Fully active, able to carry on all predisease activities without restriction)  1 - Symptomatic but completely ambulatory (Restricted in physically strenuous activity but ambulatory and able to carry out work of a light or sedentary nature. For example, light housework, office work)  2 - Symptomatic, <50% in bed during the day (Ambulatory and capable of all self care but unable to carry out any work activities. Up and about more than 50% of waking hours)  3 - Symptomatic, >50% in bed, but not bedbound (Capable of only limited self-care, confined to bed or chair 50% or more of waking hours)  4 - Bedbound (Completely disabled. Cannot carry on any self-care. Totally confined to bed or chair)  5 - Death   Eustace Pen MM, Creech RH, Tormey DC, et al. [phone]). "Toxicity and response criteria of the [facility]". [place] Oncol. 5 (6): 649-55   LABORATORY DATA:  Lab Results  Component Value Date   WBC 8.5 11/27/2016   HGB 14.9 11/27/2016   HCT 44.2 11/27/2016   MCV 89.3 11/27/2016   PLT 210 11/27/2016   Lab Results  Component Value Date   NA 137 11/27/2016   K 3.9 11/27/2016   CL 105 11/27/2016   CO2 24 11/27/2016   Lab Results  Component  Value Date   ALT 32 11/27/2016   AST 24 11/27/2016   ALKPHOS 72 11/27/2016   BILITOT 0.6 11/27/2016      RADIOGRAPHY: Ct Chest W Contrast  Result Date: 02/11/2017 CLINICAL DATA:  Followup colon cancer.  Pulmonary nodule. EXAM: CT CHEST WITH CONTRAST TECHNIQUE: Multidetector CT imaging of the chest was performed during intravenous contrast administration. CONTRAST:  80mL ISOVUE-300 IOPAMIDOL (ISOVUE-300) INJECTION 61% Creatinine was obtained on site at [place] at [address] Results: Creatinine 0.6 mg/dL. COMPARISON:  11/27/2016. FINDINGS: Cardiovascular: The heart size appears normal. No pericardial effusion. Aortic atherosclerosis. Metallic stent within the LAD coronary artery noted. Mediastinum/Nodes: The trachea appears patent and is midline. Normal appearance of the esophagus. 8 mm left paratracheal lymph node identified. Stable from previous exam. Epicardial lymph node measures 2.6 x 1.0 cm, image 89 of series 3. On the previous exam this measured 1.7 x 0.9 cm. Lungs/Pleura: There is a 1.2 cm nodule within the central left upper lobe, image 62 of series 4. Previously 1 cm. Upper Abdomen: Along the anterior dome of the liver is a 2.1 cm low-attenuation, indeterminate structure which is unchanged from 11/08/2016, image 91 of series 3. No acute abnormality identified within the upper abdomen. Musculoskeletal: There is no aggressive lytic or sclerotic bone lesions identified. Degenerative disc disease identified within the thoracic spine. IMPRESSION: 1. The pulmonary nodule in the central left upper lobe has increased in size from previous exam. Epicardial lymph node is also increased in size in the interval. Electronically Signed   By: Kerby Moors M.D.   On: 02/11/2017 09:50       IMPRESSION/PLAN: 1.  Stage IV pT3, pN2b, M1, moderately differentiated adenocarcinoma of the right colon with nodal, and pulmonary metastases. The patient's case was discussed in conference, and Dr.  Lisbeth Renshaw reviews his imaging studies to date. He offers the patient the option to consider either continued evaluation versus definitive treatment with SBRT to the left pulmonary lesion as well as the epicardial node. Although his pathology from EBUS in December did not confirm disease, the spread pattern is most consistent with his known history of colon cancer, although early stage lung cancer could also be  the underlying diagnosis. As SBRT would be an appropriate way to manage either etiology, we discussed proceeding with treatment without additional need for tissue sampling. We discussed the risks, benefits, short, and long term effects of therapy. We reviewed the delivery and logistics of treatment and Dr. Lisbeth Renshaw has recommended a course of 5 fractions.  He is interested in proceeding and has signed written consent. He is scheduled for simulation on Monday 02/18/17.  The above documentation reflects my direct findings during this shared patient visit. Please see the separate note by Dr. Lisbeth Renshaw on this date for the remainder of the patient's plan of care.     Carola Rhine, PAC   This document serves as a record of services personally performed by Shona Simpson, PA-C and Kyung Rudd, MD. It was created on their behalf by Bethann Humble, a trained medical scribe. The creation of this record is based on the scribe's personal observations and the provider's statements to them. This document has been checked and approved by the attending provider.

## 2017-02-14 ENCOUNTER — Telehealth: Payer: Self-pay | Admitting: Radiation Oncology

## 2017-02-14 ENCOUNTER — Other Ambulatory Visit: Payer: Self-pay | Admitting: Radiation Oncology

## 2017-02-14 MED FILL — OSELTAMIVIR PHOSPHATE 75 MG: 75 | 10 days supply | Qty: 10 | Fill #0

## 2017-02-14 NOTE — Telephone Encounter (Signed)
I spoke with the patient's wife today. Yesterday evening they saw a family member who today was diagnosed with the flu. They requested our perspective on tamiflu. I called back and let her know that we would not recommend taking this unless he had symptoms, taking into consideration the risks and side effects of the medication as he is currently asymptomatic. His PCP did call this in for him, and they will fill this and let us know if he has had symptoms prior to his next visit.

## 2017-02-18 ENCOUNTER — Ambulatory Visit
Admission: RE | Admit: 2017-02-18 | Discharge: 2017-02-18 | Disposition: A | Discharge status: Home / Self Care | Payer: 59 | Source: Ambulatory Visit | Attending: Radiation Oncology | Admitting: Radiation Oncology

## 2017-02-18 ENCOUNTER — Encounter: Payer: Self-pay | Admitting: Radiation Oncology

## 2017-02-18 DIAGNOSIS — C7802 Secondary malignant neoplasm of left lung: Secondary | ICD-10-CM

## 2017-02-18 DIAGNOSIS — I252 Old myocardial infarction: Secondary | ICD-10-CM | POA: Diagnosis not present

## 2017-02-18 DIAGNOSIS — Z9049 Acquired absence of other specified parts of digestive tract: Secondary | ICD-10-CM | POA: Diagnosis not present

## 2017-02-18 DIAGNOSIS — C182 Malignant neoplasm of ascending colon: Secondary | ICD-10-CM | POA: Diagnosis not present

## 2017-02-18 DIAGNOSIS — Z510 Encounter for antineoplastic radiation therapy: Secondary | ICD-10-CM | POA: Diagnosis not present

## 2017-02-18 DIAGNOSIS — C787 Secondary malignant neoplasm of liver and intrahepatic bile duct: Secondary | ICD-10-CM | POA: Diagnosis not present

## 2017-02-18 DIAGNOSIS — N400 Enlarged prostate without lower urinary tract symptoms: Secondary | ICD-10-CM | POA: Diagnosis not present

## 2017-02-18 DIAGNOSIS — I2510 Atherosclerotic heart disease of native coronary artery without angina pectoris: Secondary | ICD-10-CM | POA: Diagnosis not present

## 2017-02-18 DIAGNOSIS — I10 Essential (primary) hypertension: Secondary | ICD-10-CM | POA: Diagnosis not present

## 2017-02-25 MED FILL — TAMSULOSIN HCL 0.4 MG CAP: 0.4 | 90 days supply | Qty: 90 | Fill #0

## 2017-02-27 DIAGNOSIS — I10 Essential (primary) hypertension: Secondary | ICD-10-CM | POA: Diagnosis not present

## 2017-02-27 DIAGNOSIS — Z9049 Acquired absence of other specified parts of digestive tract: Secondary | ICD-10-CM | POA: Diagnosis not present

## 2017-02-27 DIAGNOSIS — C787 Secondary malignant neoplasm of liver and intrahepatic bile duct: Secondary | ICD-10-CM | POA: Diagnosis not present

## 2017-02-27 DIAGNOSIS — I252 Old myocardial infarction: Secondary | ICD-10-CM | POA: Diagnosis not present

## 2017-02-27 DIAGNOSIS — I2510 Atherosclerotic heart disease of native coronary artery without angina pectoris: Secondary | ICD-10-CM | POA: Diagnosis not present

## 2017-02-27 DIAGNOSIS — Z510 Encounter for antineoplastic radiation therapy: Secondary | ICD-10-CM | POA: Diagnosis not present

## 2017-02-27 DIAGNOSIS — N400 Enlarged prostate without lower urinary tract symptoms: Secondary | ICD-10-CM | POA: Diagnosis not present

## 2017-02-27 DIAGNOSIS — C182 Malignant neoplasm of ascending colon: Secondary | ICD-10-CM | POA: Diagnosis not present

## 2017-02-27 DIAGNOSIS — C7802 Secondary malignant neoplasm of left lung: Secondary | ICD-10-CM | POA: Diagnosis not present

## 2017-03-04 ENCOUNTER — Ambulatory Visit
Admission: RE | Admit: 2017-03-04 | Discharge: 2017-03-04 | Disposition: A | Discharge status: Home / Self Care | Payer: 59 | Source: Ambulatory Visit | Attending: Radiation Oncology | Admitting: Radiation Oncology

## 2017-03-04 DIAGNOSIS — N400 Enlarged prostate without lower urinary tract symptoms: Secondary | ICD-10-CM | POA: Diagnosis not present

## 2017-03-04 DIAGNOSIS — Z9049 Acquired absence of other specified parts of digestive tract: Secondary | ICD-10-CM | POA: Diagnosis not present

## 2017-03-04 DIAGNOSIS — C7802 Secondary malignant neoplasm of left lung: Secondary | ICD-10-CM | POA: Diagnosis not present

## 2017-03-04 DIAGNOSIS — C182 Malignant neoplasm of ascending colon: Secondary | ICD-10-CM | POA: Diagnosis not present

## 2017-03-04 DIAGNOSIS — I252 Old myocardial infarction: Secondary | ICD-10-CM | POA: Diagnosis not present

## 2017-03-04 DIAGNOSIS — I2510 Atherosclerotic heart disease of native coronary artery without angina pectoris: Secondary | ICD-10-CM | POA: Diagnosis not present

## 2017-03-04 DIAGNOSIS — Z510 Encounter for antineoplastic radiation therapy: Secondary | ICD-10-CM | POA: Diagnosis not present

## 2017-03-04 DIAGNOSIS — C787 Secondary malignant neoplasm of liver and intrahepatic bile duct: Secondary | ICD-10-CM | POA: Diagnosis not present

## 2017-03-04 DIAGNOSIS — I10 Essential (primary) hypertension: Secondary | ICD-10-CM | POA: Diagnosis not present

## 2017-03-06 ENCOUNTER — Ambulatory Visit
Admission: RE | Admit: 2017-03-06 | Discharge: 2017-03-06 | Disposition: A | Discharge status: Home / Self Care | Payer: 59 | Source: Ambulatory Visit | Attending: Radiation Oncology | Admitting: Radiation Oncology

## 2017-03-06 DIAGNOSIS — I2510 Atherosclerotic heart disease of native coronary artery without angina pectoris: Secondary | ICD-10-CM | POA: Diagnosis not present

## 2017-03-06 DIAGNOSIS — Z9049 Acquired absence of other specified parts of digestive tract: Secondary | ICD-10-CM | POA: Diagnosis not present

## 2017-03-06 DIAGNOSIS — I10 Essential (primary) hypertension: Secondary | ICD-10-CM | POA: Diagnosis not present

## 2017-03-06 DIAGNOSIS — C787 Secondary malignant neoplasm of liver and intrahepatic bile duct: Secondary | ICD-10-CM | POA: Diagnosis not present

## 2017-03-06 DIAGNOSIS — N400 Enlarged prostate without lower urinary tract symptoms: Secondary | ICD-10-CM | POA: Diagnosis not present

## 2017-03-06 DIAGNOSIS — Z510 Encounter for antineoplastic radiation therapy: Secondary | ICD-10-CM | POA: Diagnosis not present

## 2017-03-06 DIAGNOSIS — I252 Old myocardial infarction: Secondary | ICD-10-CM | POA: Diagnosis not present

## 2017-03-06 DIAGNOSIS — C182 Malignant neoplasm of ascending colon: Secondary | ICD-10-CM | POA: Diagnosis not present

## 2017-03-06 DIAGNOSIS — C7802 Secondary malignant neoplasm of left lung: Secondary | ICD-10-CM | POA: Diagnosis not present

## 2017-03-08 ENCOUNTER — Ambulatory Visit
Admission: RE | Admit: 2017-03-08 | Discharge: 2017-03-08 | Disposition: A | Discharge status: Home / Self Care | Payer: 59 | Source: Ambulatory Visit | Attending: Radiation Oncology | Admitting: Radiation Oncology

## 2017-03-08 DIAGNOSIS — I10 Essential (primary) hypertension: Secondary | ICD-10-CM | POA: Diagnosis not present

## 2017-03-08 DIAGNOSIS — C7802 Secondary malignant neoplasm of left lung: Secondary | ICD-10-CM

## 2017-03-08 DIAGNOSIS — I252 Old myocardial infarction: Secondary | ICD-10-CM | POA: Diagnosis not present

## 2017-03-08 DIAGNOSIS — C787 Secondary malignant neoplasm of liver and intrahepatic bile duct: Secondary | ICD-10-CM | POA: Diagnosis not present

## 2017-03-08 DIAGNOSIS — C182 Malignant neoplasm of ascending colon: Secondary | ICD-10-CM | POA: Diagnosis not present

## 2017-03-08 DIAGNOSIS — N400 Enlarged prostate without lower urinary tract symptoms: Secondary | ICD-10-CM | POA: Diagnosis not present

## 2017-03-08 DIAGNOSIS — Z9049 Acquired absence of other specified parts of digestive tract: Secondary | ICD-10-CM | POA: Diagnosis not present

## 2017-03-08 DIAGNOSIS — Z510 Encounter for antineoplastic radiation therapy: Secondary | ICD-10-CM | POA: Diagnosis not present

## 2017-03-08 DIAGNOSIS — I2510 Atherosclerotic heart disease of native coronary artery without angina pectoris: Secondary | ICD-10-CM | POA: Diagnosis not present

## 2017-03-08 NOTE — Progress Notes (Deleted)
[facility]iation [facility] Simulation and Treatment Planning Note   Name:  Samarth Ogle MRN: [id number]   Date: 03/08/2017  DOB: 1956/08/21  Status:outpatient    DIAGNOSIS:    ICD-9-CM ICD-10-CM   1. Malignant neoplasm metastatic to left lung ([place]) 197.0 C78.02      CONSENT VERIFIED:yes   SET UP: Patient is setup supine   IMMOBILIZATION: The patient was immobilized using a Vac Loc bag and Abdominal Compression.   NARRATIVE:The patient was brought to the [facility].  Identity was confirmed.  All relevant records and images related to the planned course of therapy were reviewed.  Then, the patient was positioned in a stable reproducible clinical set-up for radiation therapy. Abdominal compression was applied by me.  4D CT images were obtained and reproducible breathing pattern was confirmed. Free breathing CT images were obtained.  Skin markings were placed.  The CT images were loaded into the planning software where the target and avoidance structures were contoured.  The radiation prescription was entered and confirmed.    TREATMENT PLANNING NOTE:  Treatment planning then occurred. I have requested : MLC's, isodose plan, basic dose calculation.  3 dimensional simulation is performed and dose volume histogram of the gross tumor volume, planning tumor volume and criticial normal structures including the spinal cord and lungs were analyzed and requested.  Special treatment procedure was performed due to high dose per fraction.  The patient will be monitored for increased risk of toxicity.  Daily imaging using cone beam CT will be used for target localization.  I anticipate that the patient will receive 60 Gy in 5 fractions to target volume Within the left lung and a dose of 50 gray in 5 fractions to the anterior mediastinum tumor. Further adjustments will be made based on the planning process is  necessary.  ------------------------------------------------  Jodelle Gross, MD, PhD

## 2017-03-08 NOTE — Progress Notes (Signed)
Progress Notes Date of Service: 02/18/2017 4:44 PM Kyung Rudd, MD  Radiation Oncology    [] Hide copied text [] Hover for attribution information [facility] Radiation Oncology Simulation and Treatment Planning Note   Name:  Allen Glenn     MRN: [id number]              Date: 02/18/2017                      DOB: [birth year]  Status:outpatient    DIAGNOSIS:    ICD-9-CM ICD-10-CM   1. Malignant neoplasm metastatic to left lung ([place]) 197.0 C78.02      CONSENT VERIFIED:yes   SET UP: Patient is setup supine   IMMOBILIZATION: The patient was immobilized using a Vac Loc bag and Abdominal Compression.   NARRATIVE:The patient was brought to the [facility]. Identity was confirmed. All relevant records and images related to the planned course of therapy were reviewed. Then, the patient was positioned in a stable reproducible clinical set-up for radiation therapy.Abdominal compression was applied by me.  4D CT images were obtained and reproducible breathing pattern was confirmed. Free breathing CT images were obtained. Skin markings were placed. The CT images were loaded into the planning software where the target and avoidance structures were contoured. The radiation prescription was entered and confirmed.    TREATMENT PLANNING NOTE:  Treatment planning then occurred. I have requested : MLC's, isodose plan, basic dose calculation.  3 dimensional simulation is performed and dose volume histogram of the gross tumor volume, planning tumor volume and criticial normal structures including the spinal cord and lungs were analyzed and requested.  Special treatment procedure was performed due to high dose per fraction.  The patient will be monitored for increased risk of toxicity.  Daily imaging using cone beam CT will be used for target localization.  I anticipate that the patient will receive 60 Gy in 5 fractions to target volume  Within the left lung and a dose of 50 gray in 5 fractions to the anterior mediastinum tumor. Further adjustments will be made based on the planning process is necessary.  ------------------------------------------------  Jodelle Gross, MD, PhD

## 2017-03-08 NOTE — Addendum Note (Signed)
Encounter addended by: Kyung Rudd, MD on: 03/08/2017  4:49 PM<BR>    Actions taken: Delete clinical note

## 2017-03-11 ENCOUNTER — Ambulatory Visit: Payer: 59 | Admitting: Radiation Oncology

## 2017-03-12 ENCOUNTER — Ambulatory Visit
Admission: RE | Admit: 2017-03-12 | Discharge: 2017-03-12 | Disposition: A | Discharge status: Home / Self Care | Payer: 59 | Source: Ambulatory Visit | Attending: Radiation Oncology | Admitting: Radiation Oncology

## 2017-03-12 DIAGNOSIS — I10 Essential (primary) hypertension: Secondary | ICD-10-CM | POA: Diagnosis not present

## 2017-03-12 DIAGNOSIS — N400 Enlarged prostate without lower urinary tract symptoms: Secondary | ICD-10-CM | POA: Diagnosis not present

## 2017-03-12 DIAGNOSIS — C7802 Secondary malignant neoplasm of left lung: Secondary | ICD-10-CM | POA: Diagnosis not present

## 2017-03-12 DIAGNOSIS — C787 Secondary malignant neoplasm of liver and intrahepatic bile duct: Secondary | ICD-10-CM | POA: Diagnosis not present

## 2017-03-12 DIAGNOSIS — I2510 Atherosclerotic heart disease of native coronary artery without angina pectoris: Secondary | ICD-10-CM | POA: Diagnosis not present

## 2017-03-12 DIAGNOSIS — Z9049 Acquired absence of other specified parts of digestive tract: Secondary | ICD-10-CM | POA: Diagnosis not present

## 2017-03-12 DIAGNOSIS — C182 Malignant neoplasm of ascending colon: Secondary | ICD-10-CM | POA: Diagnosis not present

## 2017-03-12 DIAGNOSIS — I252 Old myocardial infarction: Secondary | ICD-10-CM | POA: Diagnosis not present

## 2017-03-12 DIAGNOSIS — Z510 Encounter for antineoplastic radiation therapy: Secondary | ICD-10-CM | POA: Diagnosis not present

## 2017-03-13 ENCOUNTER — Ambulatory Visit: Payer: 59 | Admitting: Radiation Oncology

## 2017-03-13 MED FILL — LISINOPRIL 2.5 MG TABLET: 2.5 | 90 days supply | Qty: 90 | Fill #0

## 2017-03-14 ENCOUNTER — Ambulatory Visit
Admission: RE | Admit: 2017-03-14 | Discharge: 2017-03-14 | Disposition: A | Discharge status: Home / Self Care | Payer: 59 | Source: Ambulatory Visit | Attending: Radiation Oncology | Admitting: Radiation Oncology

## 2017-03-14 ENCOUNTER — Encounter: Payer: Self-pay | Admitting: Radiation Oncology

## 2017-03-14 DIAGNOSIS — N400 Enlarged prostate without lower urinary tract symptoms: Secondary | ICD-10-CM | POA: Diagnosis not present

## 2017-03-14 DIAGNOSIS — I252 Old myocardial infarction: Secondary | ICD-10-CM | POA: Diagnosis not present

## 2017-03-14 DIAGNOSIS — I10 Essential (primary) hypertension: Secondary | ICD-10-CM | POA: Diagnosis not present

## 2017-03-14 DIAGNOSIS — Z510 Encounter for antineoplastic radiation therapy: Secondary | ICD-10-CM | POA: Diagnosis not present

## 2017-03-14 DIAGNOSIS — C7802 Secondary malignant neoplasm of left lung: Secondary | ICD-10-CM | POA: Diagnosis not present

## 2017-03-14 DIAGNOSIS — C787 Secondary malignant neoplasm of liver and intrahepatic bile duct: Secondary | ICD-10-CM | POA: Diagnosis not present

## 2017-03-14 DIAGNOSIS — I2510 Atherosclerotic heart disease of native coronary artery without angina pectoris: Secondary | ICD-10-CM | POA: Diagnosis not present

## 2017-03-14 DIAGNOSIS — C182 Malignant neoplasm of ascending colon: Secondary | ICD-10-CM | POA: Diagnosis not present

## 2017-03-14 DIAGNOSIS — Z9049 Acquired absence of other specified parts of digestive tract: Secondary | ICD-10-CM | POA: Diagnosis not present

## 2017-03-18 NOTE — Progress Notes (Signed)
  Radiation Oncology         [phone] ________________________________  Name: Allen Glenn MRN: [id number]  Date: 03/14/2017  DOB: [birth year]  End of Treatment Note  Diagnosis:   Stage IV (pT3, pN2b, M1) moderately differentiated adenocarcinoma of the right colon with nodal and pulmonary metastases  Indication for treatment:  Palliative   Radiation treatment dates:   03/04/17 - 03/14/17  Site/dose:    1) Left Lung: 60 Gy in 5 fractions 2) Left Chest: 50 Gy in 5 fractions  Beams/energy:    1) SBRT/SRT-3D // 6FFF Photon 2) SBRT/SRT-3D // 10FFF Photon  Narrative: The patient tolerated radiation treatment relatively well. During treatment, the patient denies cough, difficulty swallowing, or pain. He reported mild fatigue and leg cramps. He had a good appetite.  Plan: The patient has completed radiation treatment. The patient will return to radiation oncology clinic for routine followup in one month. I advised them to call or return sooner if they have any questions or concerns related to their recovery or treatment.  ------------------------------------------------  Jodelle Gross, MD, PhD  This document serves as a record of services personally performed by Kyung Rudd, MD. It was created on his behalf by Darcus Austin, a trained medical scribe. The creation of this record is based on the scribe's personal observations and the provider's statements to them. This document has been checked and approved by the attending provider.

## 2017-04-15 NOTE — Progress Notes (Signed)
Allen Glenn 35 y,o.  man with adenocarcinoma of the right colon with nodal and pulmonary metastases radiation completed 03-14-17, one month F.U.  Weight changes, if any: Wt Readings from Last 3 Encounters:  04/16/17 204 lb (92.5 kg)  02/13/17 198 lb 4.8 oz (89.9 kg)  12/26/16 199 lb 14.4 oz (90.7 kg)   Respiratory complaints, if any: Denies any respiratory issues. Hemoptysis, if any: none  Swallowing Problems/Pain/Difficulty swallowing:None Smoking Tobacco/Marijuana/Snuff/ETOH use: Never a smoker, no drug or alcohol usage Appetite :Good, eating three meals per day with snacks Pain:None When is next chemo scheduled?:No Lab work from of chart:N/A Imaging:N/A 02-13-17 Saw Dr. Betsy Coder  Will return for an office visit in 3 months BP 135/70   Pulse 84   Temp 98.2 F (36.8 C) (Oral)   Resp 18   Ht 6\' 1"  (1.854 m)   Wt 204 lb (92.5 kg)   SpO2 98%   BMI 26.91 kg/m

## 2017-04-16 ENCOUNTER — Ambulatory Visit
Admission: RE | Admit: 2017-04-16 | Discharge: 2017-04-16 | Disposition: A | Discharge status: Home / Self Care | Payer: 59 | Source: Ambulatory Visit | Attending: Radiation Oncology | Admitting: Radiation Oncology

## 2017-04-16 ENCOUNTER — Encounter: Payer: Self-pay | Admitting: Radiation Oncology

## 2017-04-16 VITALS — BP 135/70 | HR 84 | Temp 98.2°F | Resp 18 | Ht 73.0 in | Wt 204.0 lb

## 2017-04-16 DIAGNOSIS — C7802 Secondary malignant neoplasm of left lung: Secondary | ICD-10-CM

## 2017-04-16 DIAGNOSIS — C779 Secondary and unspecified malignant neoplasm of lymph node, unspecified: Secondary | ICD-10-CM | POA: Insufficient documentation

## 2017-04-16 DIAGNOSIS — C182 Malignant neoplasm of ascending colon: Secondary | ICD-10-CM | POA: Insufficient documentation

## 2017-04-16 DIAGNOSIS — Z7982 Long term (current) use of aspirin: Secondary | ICD-10-CM | POA: Insufficient documentation

## 2017-04-16 DIAGNOSIS — C7800 Secondary malignant neoplasm of unspecified lung: Secondary | ICD-10-CM | POA: Insufficient documentation

## 2017-04-16 NOTE — Addendum Note (Signed)
Encounter addended by: Malena Edman, RN on: 04/16/2017  3:27 PM<BR>    Actions taken: Charge Capture section accepted

## 2017-04-16 NOTE — Progress Notes (Signed)
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 Left Lung: 60 Gy in 5 fractions 2. Left Chest: 50 Gy in 5 fractions  Narrative:  The patient returns today for routine follow-up. The patient tolerated radiotherapy well without disruption in his course. He is complaining follow-up with [name]. He has questions regarding when his first set of imaging studies will be ordered.                          On review of systems, the patient states he is doing very well overall and denies any concerns with shortness of breath or chest pain. He denies any fevers, or skin disruption. No other complaints or verbalized.  ALLERGIES:  is allergic to no known allergies.  Meds: Current Outpatient Prescriptions  Medication Sig Dispense Refill  . aspirin EC 81 MG tablet Take 81 mg by mouth daily.    Marland Kitchen atorvastatin (LIPITOR) 10 MG tablet Take 10 mg by mouth daily at 6 PM.     . Calcium-Magnesium-Vitamin D (CALCIUM MAGNESIUM PO) Take 1 tablet by mouth daily.    Marland Kitchen lisinopril (PRINIVIL,ZESTRIL) 2.5 MG tablet Take 2.5 mg by mouth daily.    . Multiple Vitamin (MULTIVITAMIN) capsule Take 1 capsule by mouth daily.     . tamsulosin (FLOMAX) 0.4 MG CAPS capsule Take 0.4 mg by mouth daily.    . Zinc 50 MG CAPS Take 50 mg by mouth daily.    . nitroGLYCERIN (NITROSTAT) 0.4 MG SL tablet Place 0.4 mg under the tongue every 5 (five) minutes as needed for chest pain. CALL 911 IF PAIN PERSISTS     No current facility-administered medications for this encounter.     Physical Findings:  height is 6\' 1"  (1.854 m) and weight is 204 lb (92.5  kg). His oral temperature is 98.2 F (36.8 C). His blood pressure is 135/70 and his pulse is 84. His respiration is 18 and oxygen saturation is 98%.  Pain Assessment Pain Score: 0-No pain/10 In general this is a well appearing caucasian male in no acute distress. He's alert and oriented x4 and appropriate throughout the examination. Cardiopulmonary assessment is negative for acute distress and he exhibits normal effort.   Lab Findings: Lab Results  Component Value Date   WBC 8.5 11/27/2016   HGB 14.9 11/27/2016   HCT 44.2 11/27/2016   MCV 89.3 11/27/2016   PLT 210 11/27/2016     Radiographic Findings: No results found.  Impression/Plan: 1.  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 The patient has tolerated radiotherapy well and will move forward in surveillance with [name]. I will contact [name] to determine when the patient will move forward with CT imaging as a new post treatment baseline of his lungs. The patient was counseled that we would be happy to see him back if he has questions or concerns regarding his previous treatment, and was given precautions to call if he develops symptoms of pneumonitis.     Carola Rhine, PAC

## 2017-04-29 ENCOUNTER — Telehealth: Payer: Self-pay

## 2017-04-29 NOTE — Telephone Encounter (Signed)
Pt saw Allen Glenn in April. [name] told them that Dr Benay Spice will probably want a CT CAP before pt's visit with Dr Benay Spice on 5/22. No CT has been ordered. Pt would prefer GI at [facility] b/c wife works at [employer] and can walk across the street when he has it done.

## 2017-04-29 NOTE — Telephone Encounter (Signed)
Timing of cts per Dr. Lisbeth Renshaw Usually at least 3-4 months after xrt

## 2017-04-30 NOTE — Telephone Encounter (Signed)
Nunzio Cory,  Can you let him know Dr. Bernette Redbird thoughts for 3-4 months after treatment? I don't plan to follow him long term so he can discuss with [name] when he sees him next. Thanks, Bryson Ha

## 2017-05-02 NOTE — Telephone Encounter (Signed)
s/w wife per Dr Benay Spice and Worthy Flank responses.

## 2017-05-14 ENCOUNTER — Ambulatory Visit (HOSPITAL_BASED_OUTPATIENT_CLINIC_OR_DEPARTMENT_OTHER): Payer: 59 | Admitting: Oncology

## 2017-05-14 ENCOUNTER — Other Ambulatory Visit (HOSPITAL_BASED_OUTPATIENT_CLINIC_OR_DEPARTMENT_OTHER): Payer: 59

## 2017-05-14 ENCOUNTER — Telehealth: Payer: Self-pay | Admitting: Oncology

## 2017-05-14 VITALS — BP 125/74 | HR 72 | Temp 97.7°F | Resp 18 | Ht 73.0 in | Wt 203.1 lb

## 2017-05-14 DIAGNOSIS — R911 Solitary pulmonary nodule: Secondary | ICD-10-CM | POA: Diagnosis not present

## 2017-05-14 DIAGNOSIS — C182 Malignant neoplasm of ascending colon: Secondary | ICD-10-CM

## 2017-05-14 DIAGNOSIS — I252 Old myocardial infarction: Secondary | ICD-10-CM

## 2017-05-14 DIAGNOSIS — C787 Secondary malignant neoplasm of liver and intrahepatic bile duct: Secondary | ICD-10-CM

## 2017-05-14 DIAGNOSIS — I2510 Atherosclerotic heart disease of native coronary artery without angina pectoris: Secondary | ICD-10-CM | POA: Diagnosis not present

## 2017-05-14 DIAGNOSIS — I10 Essential (primary) hypertension: Secondary | ICD-10-CM

## 2017-05-14 DIAGNOSIS — E785 Hyperlipidemia, unspecified: Secondary | ICD-10-CM

## 2017-05-14 LAB — BASIC METABOLIC PANEL
Anion Gap: 11 mEq/L (ref 3–11)
BUN: 12.5 mg/dL (ref 7.0–26.0)
CHLORIDE: 103 meq/L (ref 98–109)
CO2: 25 meq/L (ref 22–29)
Calcium: 9.3 mg/dL (ref 8.4–10.4)
Creatinine: 0.9 mg/dL (ref 0.7–1.3)
Glucose: 102 mg/dl (ref 70–140)
POTASSIUM: 4.6 meq/L (ref 3.5–5.1)
SODIUM: 140 meq/L (ref 136–145)

## 2017-05-14 NOTE — Telephone Encounter (Signed)
Gave patient AVS and calender per 5/22 los. GI - [facility] scheduled appt for CT -

## 2017-05-14 NOTE — Progress Notes (Signed)
  [facility] OFFICE PROGRESS NOTE   Diagnosis: Colon cancer  INTERVAL HISTORY:   Mr. Girvan returns as scheduled. He completed SBRT to the left lung and mediastinum on 03/14/2017. He reports tolerating treatment well. No dyspnea, cough, or fever. Good appetite. No specific complaint.  Objective:  Vital signs in last 24 hours:  Blood pressure 125/74, pulse 72, temperature 97.7 F (36.5 C), temperature source Oral, resp. rate 18, height _0  (1.854 m), weight 203 lb 1.6 oz (92.1 kg), SpO2 99 %.    HEENT: Neck without mass Lymphatics: No cervical, supraclavicular, axillary, or inguinal nodes Resp: Lungs clear bilaterally Cardio: Regular rate and rhythm GI: No hepatosplenomegaly, no mass, nontender Vascular: No leg edema  Medications: I have reviewed the patient's current medications.  Assessment/Plan: 1. 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 No BRAFmutation  Liver biopsy 05/04/2015-metastatic adenocarcinoma consistent with a colon primary ? Staging PET scan 06/08/2015-isolated segment 4A liver lesion ? Initiation of adjuvant FOLFOX 06/13/2015 ? Restaging CT 08/09/2015 revealed a slight decrease in a borderline ileocolic node, decrease in the hepatic dome metastasis, no new lesions ? Liver resection 09/28/2015-pathology consistent with metastatic colon cancer, negative margins ? Adjuvant FOLFOX resumed 11/08/2015, oxaliplatin eliminated beginning 11/22/2015 secondary to neuropathy. He completed adjuvant chemotherapy 02/16/2016 ? Restaging chest CT 11/08/2016, compared to 05/07/2016 revealed a new 9 mm left upper lobe nodule, stable 2.2 cm right hepatic lesion ? PET scan 11/19/2016 confirmed a hypermetabolic left upper lobe nodule, hypermetabolic left paratracheal and pericardiac lymph nodes, and hypermetabolism associated  with the hypoattenuating lesion in the dome of the liver ? Status post EBUSbiopsies of the left lingula nodule and a level 4Lnode on 11/28/2016-no evidence of malignancy ? CT chest 02/11/2017-increase in size of the left pulmonary nodule and epicardial lymph node ? Status post SBRT 2 the left lung nodule and mediastinum completed 03/14/2017    2. Coronary artery disease status post a myocardial infarction in 2014  3. Hypertension  4. Hyperlipidemia   Disposition:  Mr. Yarberry tolerated the radiation well. There is no clinical evidence of disease progression. He will undergo a restaging CT evaluation in July. We checked the CEA today. He will return for an office visit a few days after the restaging CTs.  15 minutes were spent with the patient today. The majority of the time was used for counseling and coordination of care.   Betsy Coder, MD  05/14/2017  3:35 PM

## 2017-05-15 LAB — CEA (IN HOUSE-CHCC): CEA (CHCC-IN HOUSE): 2.3 ng/mL (ref 0.00–5.00)

## 2017-05-24 MED FILL — NITROGLYCERIN 0.4 MG TAB SL: 0.4 | 25 days supply | Qty: 25 | Fill #0

## 2017-05-24 MED FILL — ATORVASTATIN 10 MG TABLET: 10 | 90 days supply | Qty: 90 | Fill #0

## 2017-06-06 MED FILL — TAMSULOSIN HCL 0.4 MG CAP: 0.4 | 90 days supply | Qty: 90 | Fill #1

## 2017-06-12 MED FILL — LISINOPRIL 2.5 MG TABLET: 2.5 | 90 days supply | Qty: 90 | Fill #1

## 2017-06-28 ENCOUNTER — Telehealth: Payer: Self-pay | Admitting: *Deleted

## 2017-06-28 NOTE — Telephone Encounter (Signed)
"  We're in [place] on vacation.  We rescheduled the Scans to July 04, 2017.  We've been trying to call to reschedule Dr. [name] appointment from 07-02-2017 to after scans are done on 07-04-2017.  We also need to figure out how to get the contrast for the scans."  Answered questions in reference to provider's next available appointment.    They have called, rescheduled scans to 07-01-2017 in order to keep F/U on 07-02-2017.

## 2017-07-01 ENCOUNTER — Ambulatory Visit
Admission: RE | Admit: 2017-07-01 | Discharge: 2017-07-01 | Disposition: A | Discharge status: Home / Self Care | Payer: 59 | Source: Ambulatory Visit | Attending: Oncology | Admitting: Oncology

## 2017-07-01 ENCOUNTER — Other Ambulatory Visit: Payer: 59

## 2017-07-01 DIAGNOSIS — C182 Malignant neoplasm of ascending colon: Secondary | ICD-10-CM

## 2017-07-01 DIAGNOSIS — C189 Malignant neoplasm of colon, unspecified: Secondary | ICD-10-CM | POA: Diagnosis not present

## 2017-07-01 DIAGNOSIS — R911 Solitary pulmonary nodule: Secondary | ICD-10-CM | POA: Diagnosis not present

## 2017-07-01 MED ORDER — IOPAMIDOL (ISOVUE-300) INJECTION 61%
125.0000 mL | Freq: Once | INTRAVENOUS | Status: AC | PRN
  Administered 2017-07-01: 100 mL via INTRAVENOUS

## 2017-07-02 ENCOUNTER — Ambulatory Visit (HOSPITAL_BASED_OUTPATIENT_CLINIC_OR_DEPARTMENT_OTHER): Payer: 59 | Admitting: Oncology

## 2017-07-02 ENCOUNTER — Telehealth: Payer: Self-pay | Admitting: Oncology

## 2017-07-02 VITALS — BP 145/77 | HR 65 | Temp 98.5°F | Resp 20 | Ht 73.0 in | Wt 205.1 lb

## 2017-07-02 DIAGNOSIS — E785 Hyperlipidemia, unspecified: Secondary | ICD-10-CM

## 2017-07-02 DIAGNOSIS — C787 Secondary malignant neoplasm of liver and intrahepatic bile duct: Secondary | ICD-10-CM

## 2017-07-02 DIAGNOSIS — R911 Solitary pulmonary nodule: Secondary | ICD-10-CM

## 2017-07-02 DIAGNOSIS — I10 Essential (primary) hypertension: Secondary | ICD-10-CM

## 2017-07-02 DIAGNOSIS — I2510 Atherosclerotic heart disease of native coronary artery without angina pectoris: Secondary | ICD-10-CM

## 2017-07-02 DIAGNOSIS — I252 Old myocardial infarction: Secondary | ICD-10-CM | POA: Diagnosis not present

## 2017-07-02 DIAGNOSIS — C182 Malignant neoplasm of ascending colon: Secondary | ICD-10-CM

## 2017-07-02 NOTE — Telephone Encounter (Signed)
Scheduled appt per 7/10 los - Gave patient AVS and calender per los. Lab and f/u in 2 months.

## 2017-07-02 NOTE — Progress Notes (Signed)
[facility] OFFICE PROGRESS NOTE   Diagnosis: Colon cancer  INTERVAL HISTORY:   Allen Glenn returns as scheduled. He feels well. No complaint. Good appetite and energy level.  Objective:  Vital signs in last 24 hours:  Blood pressure (!) 145/77, pulse 65, temperature 98.5 F (36.9 C), temperature source Oral, resp. rate 20, height 6' 1"  (1.854 m), weight 205 lb 1.6 oz (93 kg), SpO2 100 %.    HEENT: Neck without mass Lymphatics: No cervical, supraclavicular, axillary, or inguinal nodes Resp: Lungs clear bilaterally Cardio: Regular rate and rhythm GI: No hepatosplenomegaly, no mass, nontender Vascular: No leg edema   Lab Results:    Lab Results  Component Value Date   CEA1 2.30 05/14/2017     Imaging:  Ct Chest W Contrast  Result Date: 07/01/2017 CLINICAL DATA:  Colon cancer restaging. EXAM: CT CHEST, ABDOMEN, AND PELVIS WITH CONTRAST TECHNIQUE: Multidetector CT imaging of the chest, abdomen and pelvis was performed following the standard protocol during bolus administration of intravenous contrast. CONTRAST:  115m ISOVUE-300 IOPAMIDOL (ISOVUE-300) INJECTION 61% COMPARISON:  PET-CT 11/19/2016. Chest CT 11/08/2016. Chest abdomen and pelvis CT 05/07/2016 FINDINGS: CT CHEST FINDINGS Cardiovascular: The heart size is normal. No pericardial effusion. Coronary artery calcification is noted. Atherosclerotic calcification is noted in the wall of the thoracic aorta. Mediastinum/Nodes: No mediastinal lymphadenopathy. There is no hilar lymphadenopathy. The esophagus has normal imaging features. There is no axillary lymphadenopathy. The pre pericardial lymph node is stable when comparing to prior diagnostic CT of 11/08/2016 , measuring 7-8 mm short axis. Lungs/Pleura: The posterior left upper lobe pulmonary nodule measured previously at 11 mm is not discernible today as it has become incorporated into interstitial and alveolar lung opacity compatible with s b r t. no new  pulmonary nodule or mass. No pleural effusion. Musculoskeletal: Bone windows reveal no worrisome lytic or sclerotic osseous lesions. CT ABDOMEN PELVIS FINDINGS Hepatobiliary: The subcapsular lesion in the dome of the left liver is similar to prior studies. Eight measures today which is slightly larger than on the prior study but this lesion was measured at 2.5 cm on the exam from 05/07/2016. Given the subcapsular, oblique position, differential slice acquisition can impact lesion measurement. If the lesion is reviewed on sagittal reconstructions, it measures smaller than it did on 05/07/2016. For example, compare image 54 of sagittal series 602 on today's exam to sagittal image 65 of series 5 on the diagnostic CT of 05/07/2016. A new 9 mm focus of soft tissue is identified along the capsule of the inferior right liver (image 71 series 3) not seen on prior imaging studies. Pancreas: No focal mass lesion. No dilatation of the main duct. No intraparenchymal cyst. No peripancreatic edema. Spleen: No splenomegaly. No focal mass lesion. Adrenals/Urinary Tract: No adrenal nodule or mass. 8 mm probable cyst lower pole right kidney stable since 05/07/2016. Left kidney unremarkable. No evidence for hydroureter. The urinary bladder appears normal for the degree of distention. Stomach/Bowel: Stomach is nondistended. No gastric wall thickening. No evidence of outlet obstruction. Duodenum is normally positioned as is the ligament of Treitz. No small bowel wall thickening. No small bowel dilatation. Status post right hemicolectomy. Vascular/Lymphatic: There is abdominal aortic atherosclerosis without aneurysm. There is no gastrohepatic or hepatoduodenal ligament lymphadenopathy. No intraperitoneal or retroperitoneal lymphadenopathy. Small lymph nodes in the root of the small bowel mesentery are stable No pelvic sidewall lymphadenopathy. Reproductive: The prostate gland and seminal vesicles have normal imaging features. Other: No  intraperitoneal free fluid. Musculoskeletal: Bone  windows reveal no worrisome lytic or sclerotic osseous lesions. IMPRESSION: 1. Posterior left upper lobe pulmonary nodule now incorporated into evolving post radiation change. The pre pericardial lymph node identified on prior imaging studies is stable. No new or progressive findings in the chest. 2. New 9 mm focus of soft tissue along the capsule of the inferior right liver, nonspecific, but close attention on follow-up imaging recommended. 3. The low-density lesion in the subcapsular aspect of the left hepatic dome measures similar size on axial imaging to the study from 05/07/2016, but when sagittal images are reviewed, the lesion measures smaller. 4.  Aortic Atherosclerois (ICD10-170.0) Electronically Signed   By: Misty Stanley M.D.   On: 07/01/2017 17:09   Ct Abdomen Pelvis W Contrast  Result Date: 07/01/2017 CLINICAL DATA:  Colon cancer restaging. EXAM: CT CHEST, ABDOMEN, AND PELVIS WITH CONTRAST TECHNIQUE: Multidetector CT imaging of the chest, abdomen and pelvis was performed following the standard protocol during bolus administration of intravenous contrast. CONTRAST:  127m ISOVUE-300 IOPAMIDOL (ISOVUE-300) INJECTION 61% COMPARISON:  PET-CT 11/19/2016. Chest CT 11/08/2016. Chest abdomen and pelvis CT 05/07/2016 FINDINGS: CT CHEST FINDINGS Cardiovascular: The heart size is normal. No pericardial effusion. Coronary artery calcification is noted. Atherosclerotic calcification is noted in the wall of the thoracic aorta. Mediastinum/Nodes: No mediastinal lymphadenopathy. There is no hilar lymphadenopathy. The esophagus has normal imaging features. There is no axillary lymphadenopathy. The pre pericardial lymph node is stable when comparing to prior diagnostic CT of 11/08/2016 , measuring 7-8 mm short axis. Lungs/Pleura: The posterior left upper lobe pulmonary nodule measured previously at 11 mm is not discernible today as it has become incorporated into  interstitial and alveolar lung opacity compatible with s b r t. no new pulmonary nodule or mass. No pleural effusion. Musculoskeletal: Bone windows reveal no worrisome lytic or sclerotic osseous lesions. CT ABDOMEN PELVIS FINDINGS Hepatobiliary: The subcapsular lesion in the dome of the left liver is similar to prior studies. Eight measures today which is slightly larger than on the prior study but this lesion was measured at 2.5 cm on the exam from 05/07/2016. Given the subcapsular, oblique position, differential slice acquisition can impact lesion measurement. If the lesion is reviewed on sagittal reconstructions, it measures smaller than it did on 05/07/2016. For example, compare image 54 of sagittal series 602 on today's exam to sagittal image 65 of series 5 on the diagnostic CT of 05/07/2016. A new 9 mm focus of soft tissue is identified along the capsule of the inferior right liver (image 71 series 3) not seen on prior imaging studies. Pancreas: No focal mass lesion. No dilatation of the main duct. No intraparenchymal cyst. No peripancreatic edema. Spleen: No splenomegaly. No focal mass lesion. Adrenals/Urinary Tract: No adrenal nodule or mass. 8 mm probable cyst lower pole right kidney stable since 05/07/2016. Left kidney unremarkable. No evidence for hydroureter. The urinary bladder appears normal for the degree of distention. Stomach/Bowel: Stomach is nondistended. No gastric wall thickening. No evidence of outlet obstruction. Duodenum is normally positioned as is the ligament of Treitz. No small bowel wall thickening. No small bowel dilatation. Status post right hemicolectomy. Vascular/Lymphatic: There is abdominal aortic atherosclerosis without aneurysm. There is no gastrohepatic or hepatoduodenal ligament lymphadenopathy. No intraperitoneal or retroperitoneal lymphadenopathy. Small lymph nodes in the root of the small bowel mesentery are stable No pelvic sidewall lymphadenopathy. Reproductive: The  prostate gland and seminal vesicles have normal imaging features. Other: No intraperitoneal free fluid. Musculoskeletal: Bone windows reveal no worrisome lytic or sclerotic osseous  lesions. IMPRESSION: 1. Posterior left upper lobe pulmonary nodule now incorporated into evolving post radiation change. The pre pericardial lymph node identified on prior imaging studies is stable. No new or progressive findings in the chest. 2. New 9 mm focus of soft tissue along the capsule of the inferior right liver, nonspecific, but close attention on follow-up imaging recommended. 3. The low-density lesion in the subcapsular aspect of the left hepatic dome measures similar size on axial imaging to the study from 05/07/2016, but when sagittal images are reviewed, the lesion measures smaller. 4.  Aortic Atherosclerois (ICD10-170.0) Electronically Signed   By: Misty Stanley M.D.   On: 07/01/2017 17:09    Medications: I have reviewed the patient's current medications.  Assessment/Plan: 1. 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 No BRAFmutation  Liver biopsy 05/04/2015-metastatic adenocarcinoma consistent with a colon primary ? Staging PET scan 06/08/2015-isolated segment 4A liver lesion ? Initiation of adjuvant FOLFOX 06/13/2015 ? Restaging CT 08/09/2015 revealed a slight decrease in a borderline ileocolic node, decrease in the hepatic dome metastasis, no new lesions ? Liver resection 09/28/2015-pathology consistent with metastatic colon cancer, negative margins ? Adjuvant FOLFOX resumed 11/08/2015, oxaliplatin eliminated beginning 11/22/2015 secondary to neuropathy. He completed adjuvant chemotherapy 02/16/2016 ? Restaging chest CT 11/08/2016, compared to 05/07/2016 revealed a new 9 mm left upper lobe nodule, stable 2.2 cm right hepatic lesion ? PET scan 11/19/2016 confirmed  a hypermetabolic left upper lobe nodule, hypermetabolic left paratracheal and pericardiac lymph nodes, and hypermetabolism associated with the hypoattenuating lesion in the dome of the liver ? Status post EBUSbiopsies of the left lingula nodule and a level 4Lnode on 11/28/2016-no evidence of malignancy ? CT chest 02/11/2017-increase in size of the left pulmonary nodule and epicardial lymph node ? Status post SBRT 2 the left lung nodule and mediastinum completed 03/14/2017  ? CTs 07/01/2017-new 9 mm focus along the right liver capsule, stable left subcapsular liver lesion, radiation change at site of left upper lobe nodule, stable pericardial and   2. Coronary artery disease status post a myocardial infarction in 2014  3. Hypertension  4. Hyperlipidemia   Disposition:  Allen Glenn appears well. The restaging CTs revealed no clear evidence of disease progression. The plan is to continue observation. I discussed the CT findings with Allen Glenn and his wife. He will return for an office visit and CEA in 2 months.  15 minutes were spent with the patient today. The majority of the time was used for counseling and coordination of care.  Donneta Romberg, MD  07/02/2017  8:31 AM

## 2017-07-04 ENCOUNTER — Other Ambulatory Visit: Payer: 59

## 2017-07-04 ENCOUNTER — Telehealth: Payer: Self-pay | Admitting: *Deleted

## 2017-07-04 NOTE — Telephone Encounter (Signed)
Message from pt's wife reporting a draining lesion above his ear. Requesting office visit to have it evaluated. Returned call, she reports area is "puffy and pink" with clear drainage. Instructed her to contact PCP to have this evaluated. She agreed to do so.

## 2017-07-18 ENCOUNTER — Telehealth: Payer: Self-pay | Admitting: Interventional Cardiology

## 2017-07-18 DIAGNOSIS — E7849 Other hyperlipidemia: Secondary | ICD-10-CM

## 2017-07-18 NOTE — Telephone Encounter (Signed)
Wife states that pt would prefer to come before appt so labs will be available to discuss at appt.  Scheduled pt to have fasting labs 09/03/17.  Wife verbalized understanding and was in agreement with this plan.

## 2017-07-18 NOTE — Telephone Encounter (Signed)
New message    Pt wife wants to know if pt needs fasting labs before appt in September?

## 2017-07-18 NOTE — Telephone Encounter (Signed)
F/U Call:  Patient wife calling, states that patient would like to come another time to have a labs completed. Patient wife requesting a call back.

## 2017-07-18 NOTE — Telephone Encounter (Signed)
Left detailed message, ok per DPR.  Left message stating fasting labs were done in October and did not necessarily need to be done prior to appt.  Advised pt can come to appt fasting and we can do labs or if they prefer to come before then just call back and let me know.

## 2017-07-19 DIAGNOSIS — C785 Secondary malignant neoplasm of large intestine and rectum: Secondary | ICD-10-CM | POA: Diagnosis not present

## 2017-07-19 DIAGNOSIS — I2510 Atherosclerotic heart disease of native coronary artery without angina pectoris: Secondary | ICD-10-CM | POA: Diagnosis not present

## 2017-07-19 DIAGNOSIS — N400 Enlarged prostate without lower urinary tract symptoms: Secondary | ICD-10-CM | POA: Diagnosis not present

## 2017-07-19 DIAGNOSIS — E782 Mixed hyperlipidemia: Secondary | ICD-10-CM | POA: Diagnosis not present

## 2017-07-19 DIAGNOSIS — I10 Essential (primary) hypertension: Secondary | ICD-10-CM | POA: Diagnosis not present

## 2017-08-23 NOTE — Progress Notes (Addendum)
  Radiation Oncology         [phone]) [phone] ________________________________  Name: Allen Glenn MRN: [id number]  Date: 02/18/2017  DOB: 1956-09-13  Letter of medical necessity  To whom it may concern,  The patient has been seen regarding his diagnosis of stage IV colon cancer. The patient previously was seen for suspicion of pulmonary metastasis. Biopsy at that time did not conclusively demonstrate what was suspected to be the case based on his prior imaging including a PET scan which showed hypermetabolic activity within the chest consistent with metastasis. We discussed at that time that his pathology likely represented a false negative report, with cancer still likely being the most likely diagnosis. We discussed possible options at that time and ultimately decided to confirm this suspicion with repeat imaging.   The patient's recent CT imaging demonstrates two enlarging areas of concern consistent with our prior conversation, related to a pulmonary nodule within the left lung as well as an anterior mediastinal lymph node. Otherwise, systemically and clinically, the patient is doing well. We discussed that there is not a viable alternative explanation for this continued growth pattern other than pulmonary metastasis. With further growth, this will ultimately compromise his breathing with compression of the pulmonary structures on the left and also may cause pain and encroachment into the mediastinal structures in terms of the anterior lymph node within the chest. The patient therefore on clinical grounds has a diagnosis of pulmonary metastasis, representing oligometastatic disease, and he wishes to proceed with aggressive treatment to this for the prevention of further growth and local problems at these sites. The patient therefore is an appropriate candidate for stereotactic body radiation treatment. The diagnosis of metastatic colon cancer has been pathologically proven previously, and his imaging  confirms pulmonary metastasis. He will receive 5 treatments to a dose of 60 Gy to the left lung pulmonary metastasis and also receive 50 Gy in 5 fractions to the anterior mediastinal nodal metastasis.   ------------------------------------------------  Jodelle Gross, MD, PhD

## 2017-09-03 ENCOUNTER — Telehealth: Payer: Self-pay

## 2017-09-03 ENCOUNTER — Other Ambulatory Visit: Payer: 59

## 2017-09-03 ENCOUNTER — Other Ambulatory Visit (HOSPITAL_BASED_OUTPATIENT_CLINIC_OR_DEPARTMENT_OTHER): Payer: 59

## 2017-09-03 ENCOUNTER — Ambulatory Visit (HOSPITAL_BASED_OUTPATIENT_CLINIC_OR_DEPARTMENT_OTHER): Payer: 59 | Admitting: Oncology

## 2017-09-03 VITALS — BP 120/59 | HR 51 | Temp 97.8°F | Resp 18 | Ht 73.0 in | Wt 205.2 lb

## 2017-09-03 DIAGNOSIS — E785 Hyperlipidemia, unspecified: Secondary | ICD-10-CM

## 2017-09-03 DIAGNOSIS — I10 Essential (primary) hypertension: Secondary | ICD-10-CM | POA: Diagnosis not present

## 2017-09-03 DIAGNOSIS — C787 Secondary malignant neoplasm of liver and intrahepatic bile duct: Secondary | ICD-10-CM

## 2017-09-03 DIAGNOSIS — I252 Old myocardial infarction: Secondary | ICD-10-CM | POA: Diagnosis not present

## 2017-09-03 DIAGNOSIS — C182 Malignant neoplasm of ascending colon: Secondary | ICD-10-CM

## 2017-09-03 DIAGNOSIS — I2510 Atherosclerotic heart disease of native coronary artery without angina pectoris: Secondary | ICD-10-CM

## 2017-09-03 DIAGNOSIS — E784 Other hyperlipidemia: Secondary | ICD-10-CM | POA: Diagnosis not present

## 2017-09-03 DIAGNOSIS — E7849 Other hyperlipidemia: Secondary | ICD-10-CM

## 2017-09-03 LAB — CEA (IN HOUSE-CHCC): CEA (CHCC-In House): 2.87 ng/mL (ref 0.00–5.00)

## 2017-09-03 LAB — HEPATIC FUNCTION PANEL
ALBUMIN: 4.4 g/dL (ref 3.6–4.8)
ALT: 39 IU/L (ref 0–44)
AST: 24 IU/L (ref 0–40)
Alkaline Phosphatase: 81 IU/L (ref 39–117)
Bilirubin Total: 0.3 mg/dL (ref 0.0–1.2)
Bilirubin, Direct: 0.1 mg/dL (ref 0.00–0.40)
TOTAL PROTEIN: 6.5 g/dL (ref 6.0–8.5)

## 2017-09-03 LAB — LIPID PANEL
CHOL/HDL RATIO: 5 ratio (ref 0.0–5.0)
Cholesterol, Total: 159 mg/dL (ref 100–199)
HDL: 32 mg/dL — AB (ref >39)
LDL Calculated: 60 mg/dL (ref 0–99)
Triglycerides: 337 mg/dL — ABNORMAL HIGH (ref 0–149)
VLDL CHOLESTEROL CAL: 67 mg/dL — AB (ref 5–40)

## 2017-09-03 MED FILL — LISINOPRIL 2.5 MG TABLET: 2.5 | 90 days supply | Qty: 90 | Fill #0

## 2017-09-03 NOTE — Telephone Encounter (Signed)
Scheduled appointment per 9/11 los. Printed avs and calender for 11/30 12/4

## 2017-09-03 NOTE — Progress Notes (Signed)
  [place] OFFICE PROGRESS NOTE   Diagnosis: Colon cancer  INTERVAL HISTORY:   Mr. Urieta returns as scheduled. He feels well. Good appetite. He had 4 days of diarrhea last week. This has resolved. No nausea or vomiting.  Objective:  Vital signs in last 24 hours:  Blood pressure (!) 120/59, pulse (!) 51, temperature 97.8 F (36.6 C), temperature source Oral, resp. rate 18, height '6\' 1"'$  (1.854 m), weight 205 lb 3.2 oz (93.1 kg), SpO2 100 %.    HEENT: Neck without mass Lymphatics: No cervical, supraclavicular, or axillary nodes Resp: Lungs clear bilaterally Cardio: Regular rate and rhythm GI: No mass, nontender, no hepatosplenomegaly Vascular: No leg edema   Lab Results:    Lab Results  Component Value Date   CEA1 2.87 09/03/2017     Medications: I have reviewed the patient's current medications.  Assessment/Plan: 1. 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 No BRAFmutation  Liver biopsy 05/04/2015-metastatic adenocarcinoma consistent with a colon primary ? Staging PET scan 06/08/2015-isolated segment 4A liver lesion ? Initiation of adjuvant FOLFOX 06/13/2015 ? Restaging CT 08/09/2015 revealed a slight decrease in a borderline ileocolic node, decrease in the hepatic dome metastasis, no new lesions ? Liver resection 09/28/2015-pathology consistent with metastatic colon cancer, negative margins ? Adjuvant FOLFOX resumed 11/08/2015, oxaliplatin eliminated beginning 11/22/2015 secondary to neuropathy. He completed adjuvant chemotherapy 02/16/2016 ? Restaging chest CT 11/08/2016, compared to 05/07/2016 revealed a new 9 mm left upper lobe nodule, stable 2.2 cm right hepatic lesion ? PET scan 11/19/2016 confirmed a hypermetabolic left upper lobe nodule, hypermetabolic left paratracheal and pericardiac lymph nodes, and  hypermetabolism associated with the hypoattenuating lesion in the dome of the liver ? Status post EBUSbiopsies of the left lingula nodule and a level 4Lnode on 11/28/2016-no evidence of malignancy ? CT chest 02/11/2017-increase in size of the left pulmonary nodule and epicardial lymph node ? Status post SBRT 2 the left lung nodule and mediastinum completed 03/14/2017 ? CTs 07/01/2017-new 9 mm focus along the right liver capsule, stable left subcapsular liver lesion, radiation change at site of left upper lobe nodule, stable pericardial lymph node   2. Coronary artery disease status post a myocardial infarction in 2014  3. Hypertension  4. Hyperlipidemia   Disposition:  Mr. Dogan appears asymptomatic from the metastatic colon cancer. No clinical evidence of disease progression. He will return for an office visit and restaging CT in 3 months.  15 minutes were spent with the patient today. The majority of the time was used for counseling and coordination of care.  Donneta Romberg, MD  09/03/2017  4:10 PM

## 2017-09-04 ENCOUNTER — Encounter: Payer: Self-pay | Admitting: Oncology

## 2017-09-04 NOTE — Telephone Encounter (Signed)
Pt's wife called for CEA result.  Informed that it was negative.

## 2017-09-06 ENCOUNTER — Telehealth: Payer: Self-pay | Admitting: *Deleted

## 2017-09-06 DIAGNOSIS — Z79899 Other long term (current) drug therapy: Secondary | ICD-10-CM

## 2017-09-06 MED ORDER — OMEGA-3-ACID ETHYL ESTERS 1 G PO CAPS: 1.0000 g | ORAL_CAPSULE | Freq: Every day | ORAL | 3 refills | Status: DC

## 2017-09-06 NOTE — Telephone Encounter (Signed)
-----   Message from [name], MD sent at 09/05/2017  5:20 PM EDT ----- Let the patient know there is adequate control of cholesterol with LDL of 60. Triglycerides little high. 1 g of omega-3 Fish all per day may be helpful for the elevated triglycerides. Recheck lipid and liver panel in one year. A copy will be sent to [name], MD

## 2017-09-09 NOTE — Progress Notes (Signed)
Cardiology Office Note    Date:  09/10/2017   ID:  Allen Glenn, DOB 1956/01/12, MRN [id number]  PCP:  [name], MD  Cardiologist: Sinclair Grooms, MD   Chief Complaint  Patient presents with  . Coronary Artery Disease    History of Present Illness:  Allen Glenn is a 61 y.o. male with anterior non-ST elevation myocardial infarction in July 2014 treated with LAD DES hyperlipidemia, recent history of colon cancer metastatic to the liver, and severe anemia associated with the colon cancer  He is doing well. His wife has a question about diastolic dysfunction that was noted on his echo report. He denies shortness of breath. He has not had orthopnea or lower extremity swelling. Adequate exertional tolerance allows him to have a good quality of life. He denies angina. No palpitations or syncope has occurred.   Past Medical History:  Diagnosis Date  . Acute MI ([facility]) 2014   acute ST elevation MI  . Anemia   . Colon cancer ([facility]) 06/13/2016   Status post resection of colon mass is well as he panic metastasis.   . Coronary artery disease   . Enlarged prostate    slightly and takes Flomax daily  . Heart disease   . History of blood transfusion   . Hyperlipidemia    takes Lipitor daily  . Hypertension    takes Lisinopril daily  . Lung nodule    left  . Nocturia   . Numbness    left foot  . Peripheral neuropathy     Past Surgical History:  Procedure Laterality Date  . 1/8 of liver removed    . APPENDECTOMY    . COLONOSCOPY    . CORONARY ANGIOPLASTY     1 stent  . CORONARY STENT PLACEMENT  06-27-2013  . HERNIA REPAIR Left 1991  . LUNG BIOPSY Left 11/28/2016   Procedure: LUNG BIOPSY, left upper lobe;  Surgeon: [name], MD;  Location: [facility];  Service: Thoracic;  Laterality: Left;  . partial coloectomy    . VIDEO BRONCHOSCOPY WITH ENDOBRONCHIAL NAVIGATION N/A 11/28/2016   Procedure: VIDEO BRONCHOSCOPY WITH ENDOBRONCHIAL NAVIGATION;  Surgeon: [name],  MD;  Location: [facility];  Service: Thoracic;  Laterality: N/A;  . VIDEO BRONCHOSCOPY WITH ENDOBRONCHIAL ULTRASOUND N/A 11/28/2016   Procedure: VIDEO BRONCHOSCOPY WITH ENDOBRONCHIAL ULTRASOUND;  Surgeon: [name], MD;  Location: [facility];  Service: Thoracic;  Laterality: N/A;    Current Medications: Outpatient Medications Prior to Visit  Medication Sig Dispense Refill  . aspirin EC 81 MG tablet Take 81 mg by mouth daily.    Marland Kitchen atorvastatin (LIPITOR) 10 MG tablet Take 10 mg by mouth daily at 6 PM.     . Calcium-Magnesium-Vitamin D (CALCIUM MAGNESIUM PO) Take 1 tablet by mouth daily.    Marland Kitchen lisinopril (PRINIVIL,ZESTRIL) 2.5 MG tablet Take 2.5 mg by mouth daily.    . Multiple Vitamin (MULTIVITAMIN) capsule Take 1 capsule by mouth daily.     . nitroGLYCERIN (NITROSTAT) 0.4 MG SL tablet Place 0.4 mg under the tongue every 5 (five) minutes as needed for chest pain. CALL 911 IF PAIN PERSISTS    . omega-3 acid ethyl esters (LOVAZA) 1 g capsule Take 1 capsule (1 g total) by mouth daily. 90 capsule 3  . tamsulosin (FLOMAX) 0.4 MG CAPS capsule Take 0.4 mg by mouth daily.    . Zinc 50 MG CAPS Take 50 mg by mouth daily.     No facility-administered medications  prior to visit.      Allergies:   No known allergies   Social History   Social History  . Marital status: Married    Spouse name: [name]  . Number of children: 2  . Years of education: Bachelor   Occupational History  . Minister    Social History Main Topics  . Smoking status: Never Smoker  . Smokeless tobacco: Never Used  . Alcohol use No  . Drug use: No  . Sexual activity: Not Asked   Other Topics Concern  . None   Social History Narrative   Live at home with wife, [name]   Patient is a [employer] manager, active and goes to gym   Caffeine: very little soda, occasional tea. 12oz-16oz/per     Family History:  The patient's family history includes Heart disease in his father and mother; Hypertension in his father and mother.    ROS:   Please see the history of present illness.    Appetite is stable. Weight is been stable. There is no peripheral edema. He has not had hemoptysis or blood in his stool. He has metastatic colon cancer. CEA is 2.87. Cancer process has been stable.  All other systems reviewed and are negative.   PHYSICAL EXAM:   VS:  BP 124/62   Pulse 60   Ht 6\' 2"  (1.88 m)   Wt 204 lb (92.5 kg)   SpO2 98%   BMI 26.19 kg/m    GEN: Well nourished, well developed, in no acute distress  HEENT: normal  Neck: no JVD, carotid bruits, or masses Cardiac: RRR; no murmurs, rubs, or gallops,no edema  Respiratory:  clear to auscultation bilaterally, normal work of breathing GI: soft, nontender, nondistended, + BS MS: no deformity or atrophy  Skin: warm and dry, no rash Neuro:  Alert and Oriented x 3, Strength and sensation are intact Psych: euthymic mood, full affect  Wt Readings from Last 3 Encounters:  09/10/17 204 lb (92.5 kg)  09/03/17 205 lb 3.2 oz (93.1 kg)  07/02/17 205 lb 1.6 oz (93 kg)      Studies/Labs Reviewed:   EKG:  EKG  Sinus rhythm. Poor R-wave progression. No change compared to prior.  Recent Labs: 11/27/2016: Hemoglobin 14.9; Platelets 210 05/14/2017: BUN 12.5; Creatinine 0.9; Potassium 4.6; Sodium 140 09/03/2017: ALT 39   Lipid Panel    Component Value Date/Time   CHOL 159 09/03/2017 0804   TRIG 337 (H) 09/03/2017 0804   HDL 32 (L) 09/03/2017 0804   CHOLHDL 5.0 09/03/2017 0804   CHOLHDL 3.6 10/23/2016 0807   VLDL 42 (H) 10/23/2016 0807   LDLCALC 60 09/03/2017 0804    Additional studies/ records that were reviewed today include:  None    ASSESSMENT:    1. Coronary artery disease of native artery of native heart with stable angina pectoris ([facility])   2. Chronic diastolic heart failure (H[facility])   3. Other hyperlipidemia   4. Other iron deficiency anemia   5. Malignant neoplasm of ascending colon ([facility])      PLAN:  In order of problems listed above:  1. Coronary  artery disease is stable without recurrent angina. Status post PCI greater than 5 years ago. Maintain an active lifestyle and report any symptoms. 2. Stable without volume overload or evidence of heart failure. 3. LDL cholesterol less than or equal to 60 when evaluated earlier this month. 4. Not addressed 5. Not addressed other than to discuss with patient his overall clinical state.  Clinical follow-up  in one year. No medication changes other than discontinuation of Lovaza and okayed the use of over-the-counter omega-3 fatty acids.    Medication Adjustments/Labs and Tests Ordered: Current medicines are reviewed at length with the patient today.  Concerns regarding medicines are outlined above.  Medication changes, Labs and Tests ordered today are listed in the Patient Instructions below. There are no Patient Instructions on file for this visit.   Signed, Sinclair Grooms, MD  09/10/2017 8:41 AM    [facility] [address], [place], [place]  [address] Phone: [phone]; Fax: [phone]

## 2017-09-10 ENCOUNTER — Ambulatory Visit (INDEPENDENT_AMBULATORY_CARE_PROVIDER_SITE_OTHER): Payer: 59 | Admitting: Interventional Cardiology

## 2017-09-10 ENCOUNTER — Encounter: Payer: Self-pay | Admitting: Interventional Cardiology

## 2017-09-10 VITALS — BP 124/62 | HR 60 | Ht 74.0 in | Wt 204.0 lb

## 2017-09-10 DIAGNOSIS — I25118 Atherosclerotic heart disease of native coronary artery with other forms of angina pectoris: Secondary | ICD-10-CM | POA: Diagnosis not present

## 2017-09-10 DIAGNOSIS — I5032 Chronic diastolic (congestive) heart failure: Secondary | ICD-10-CM | POA: Diagnosis not present

## 2017-09-10 DIAGNOSIS — E7849 Other hyperlipidemia: Secondary | ICD-10-CM

## 2017-09-10 DIAGNOSIS — C182 Malignant neoplasm of ascending colon: Secondary | ICD-10-CM

## 2017-09-10 DIAGNOSIS — D508 Other iron deficiency anemias: Secondary | ICD-10-CM

## 2017-09-10 DIAGNOSIS — E784 Other hyperlipidemia: Secondary | ICD-10-CM

## 2017-09-10 NOTE — Patient Instructions (Signed)

## 2017-09-11 MED FILL — TAMSULOSIN HCL 0.4 MG CAP: 0.4 | 90 days supply | Qty: 90 | Fill #0

## 2017-10-01 DIAGNOSIS — J069 Acute upper respiratory infection, unspecified: Secondary | ICD-10-CM | POA: Diagnosis not present

## 2017-10-01 MED FILL — AZITHROMYCIN 250 MG TABLET: 250 | 5 days supply | Qty: 6 | Fill #0

## 2017-10-01 MED FILL — FLUTICASONE PROP 50 MCG SPR: 50 | 30 days supply | Qty: 16 | Fill #0

## 2017-10-07 MED FILL — ATORVASTATIN 10 MG TABLET: 10 | 90 days supply | Qty: 90 | Fill #1

## 2017-10-24 DIAGNOSIS — Z23 Encounter for immunization: Secondary | ICD-10-CM | POA: Diagnosis not present

## 2017-10-25 IMAGING — CR DG CHEST 1V PORT
1 series · 1 of 1 positions shown · non-contrast
Comparison: 11/27/2016

CLINICAL DATA: Post bronchoscopy with biopsy.

EXAM:
PORTABLE CHEST 1 VIEW

[portable]
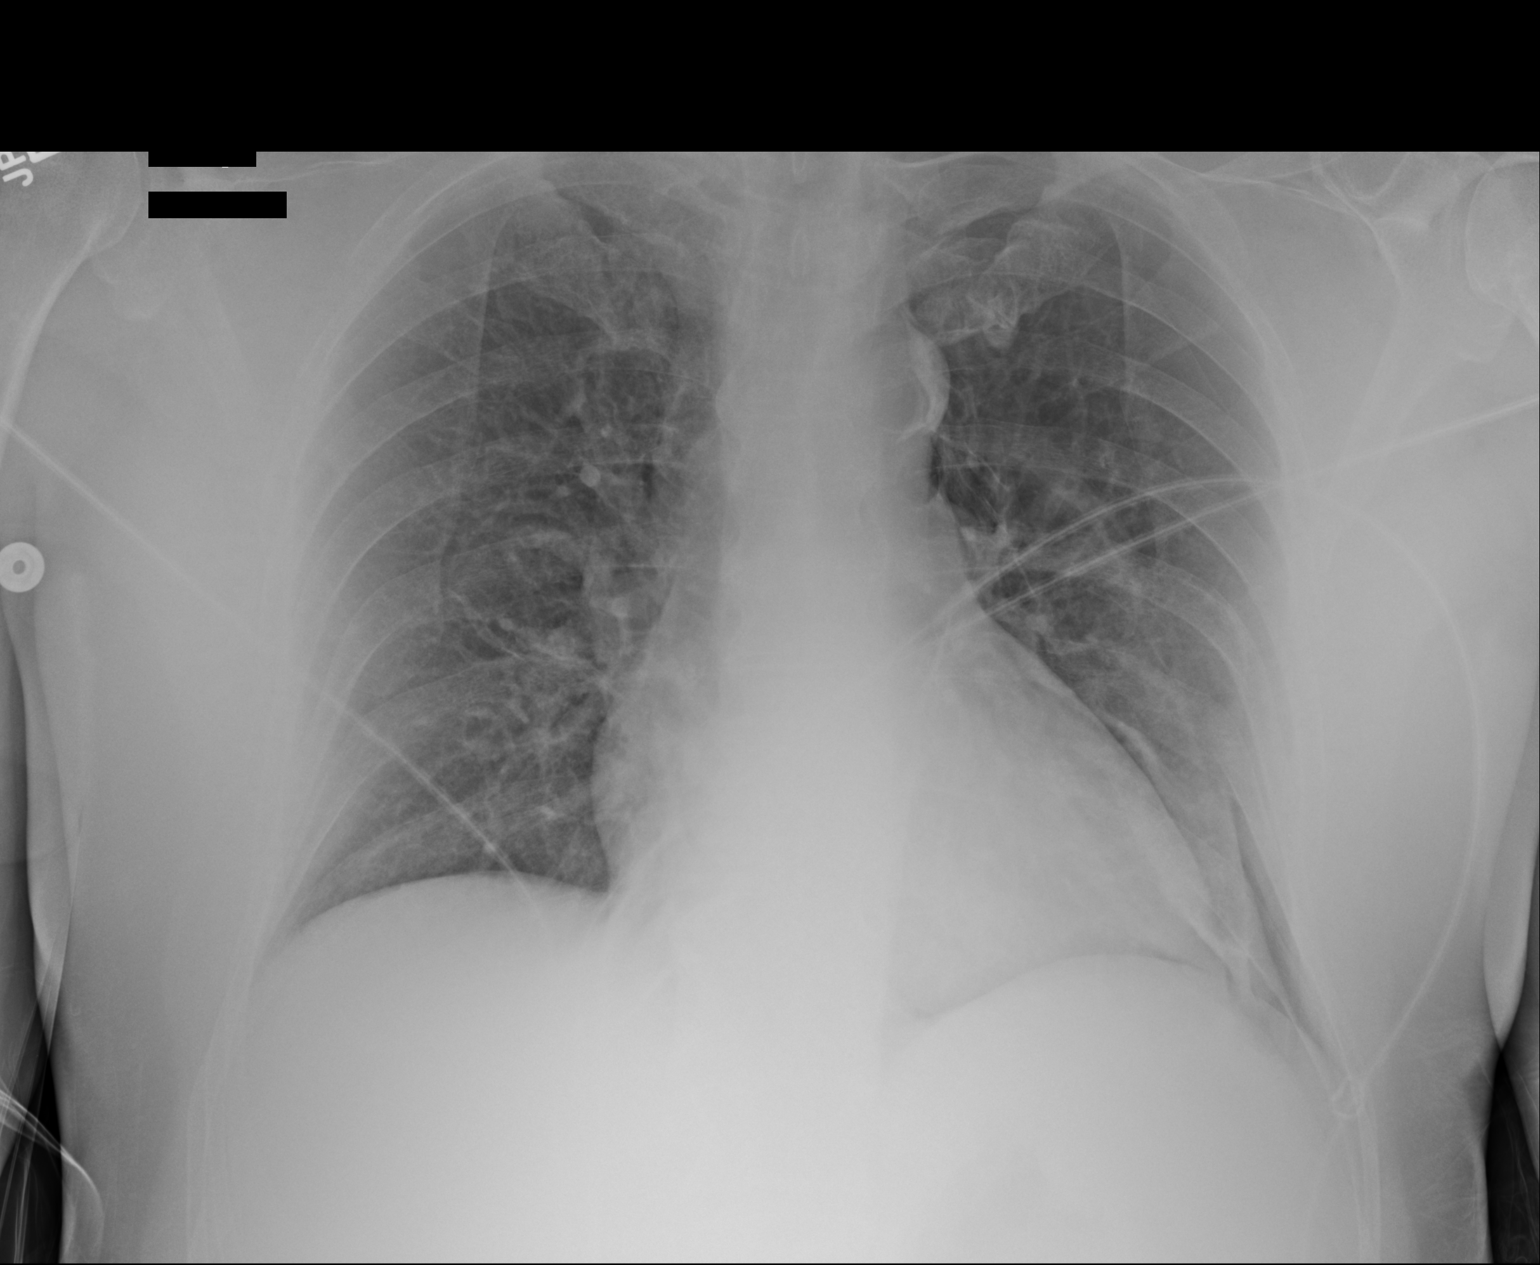

[1 of 1 positions shown; findings below may reference images not displayed]

FINDINGS: Left base opacity, likely atelectasis. Right lung is clear. Heart is
normal size. There is moderate-sized left pneumothorax noted at the
left base and over the left upper lobe/apex. This is approximately
15-20%.
IMPRESSION: Moderate-sized left pneumothorax, 15-20%.

These results will be called to the ordering clinician or
representative by the Radiologist Assistant, and communication
documented in the PACS or zVision Dashboard.

## 2017-10-26 IMAGING — DX DG CHEST 2V
2 series · 2 of 2 positions shown · non-contrast
Comparison: Chest x-ray of 11/28/2016

CLINICAL DATA: Left lung biopsy yesterday, followup of left-sided
pneumothorax

EXAM:
CHEST  2 VIEW

[chest pa]
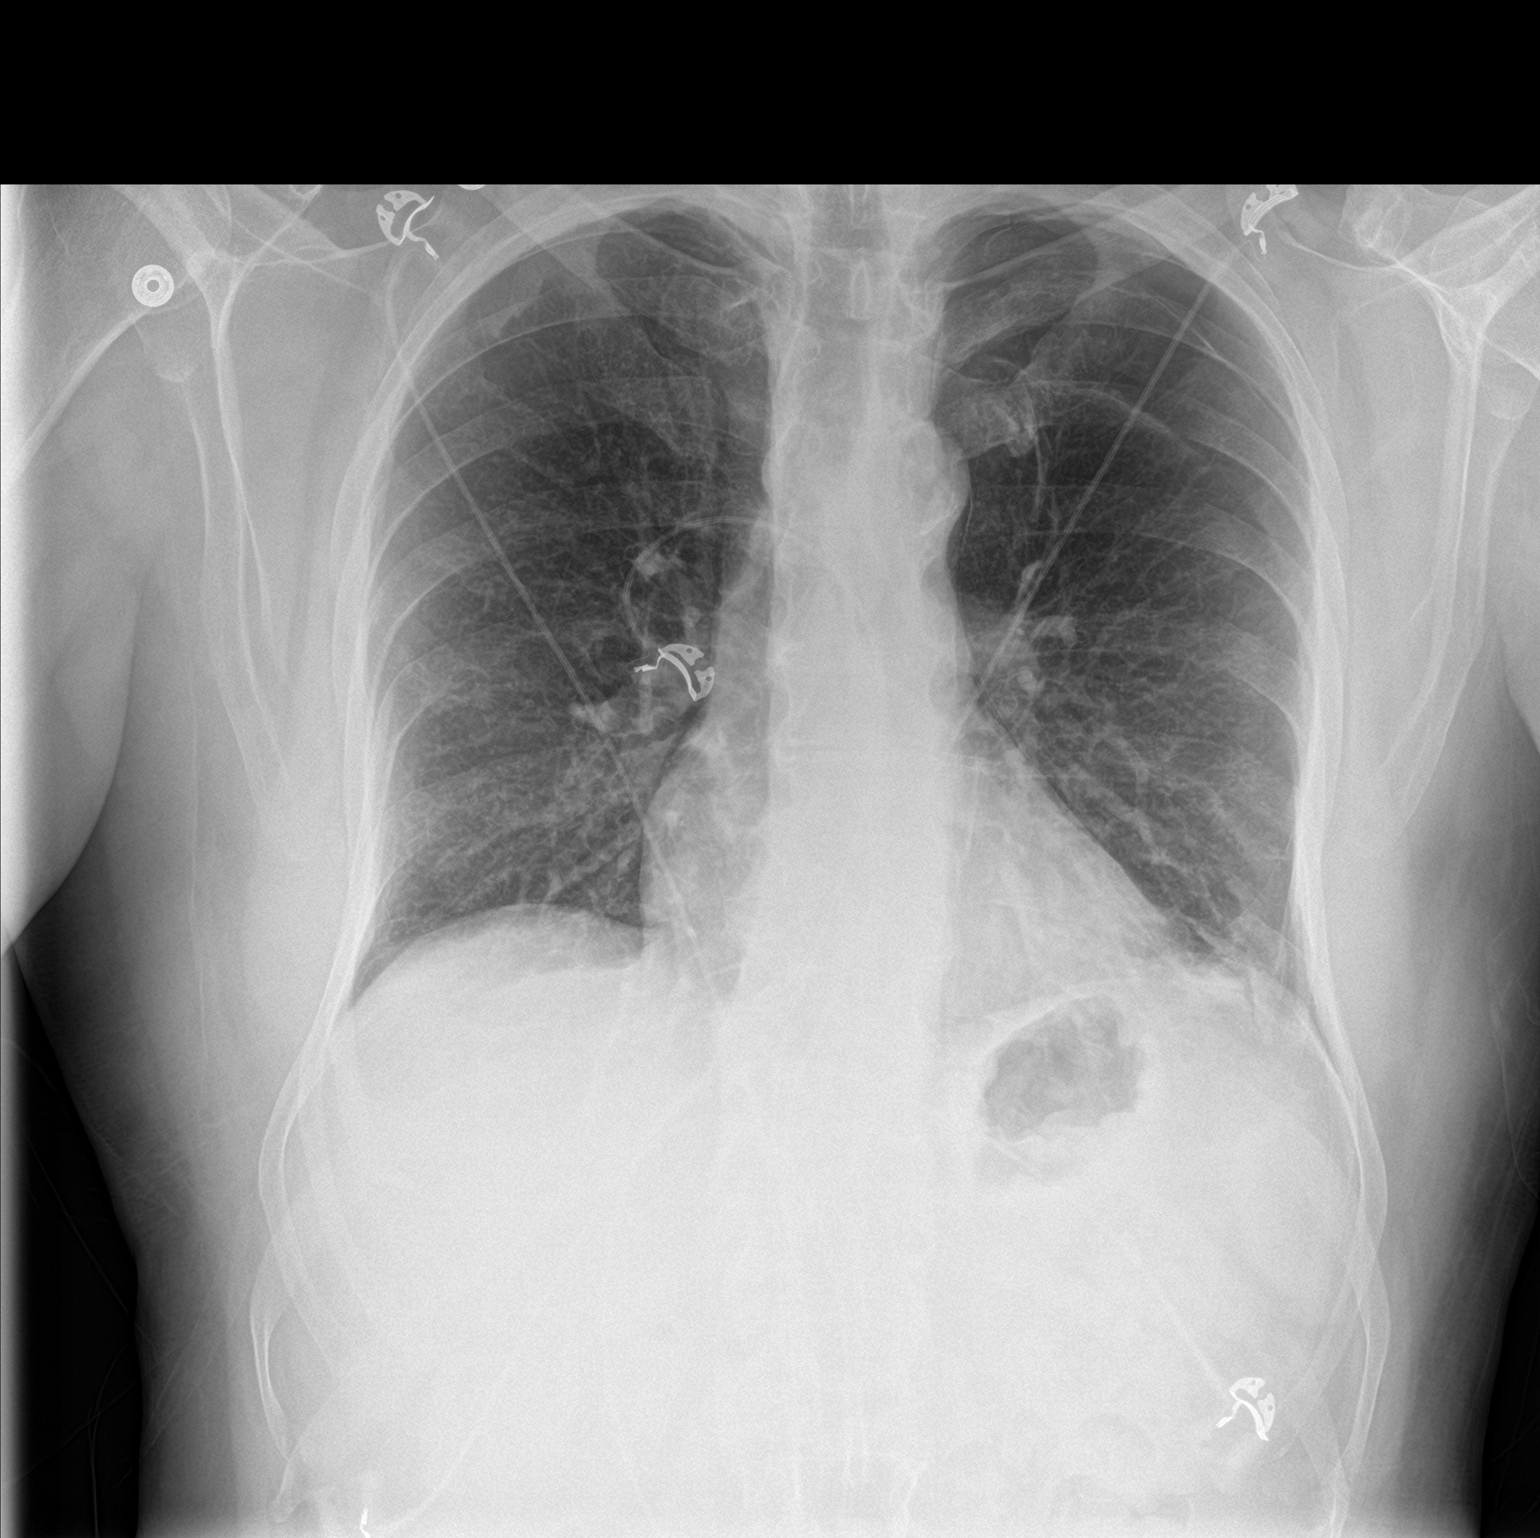

[chest lat]
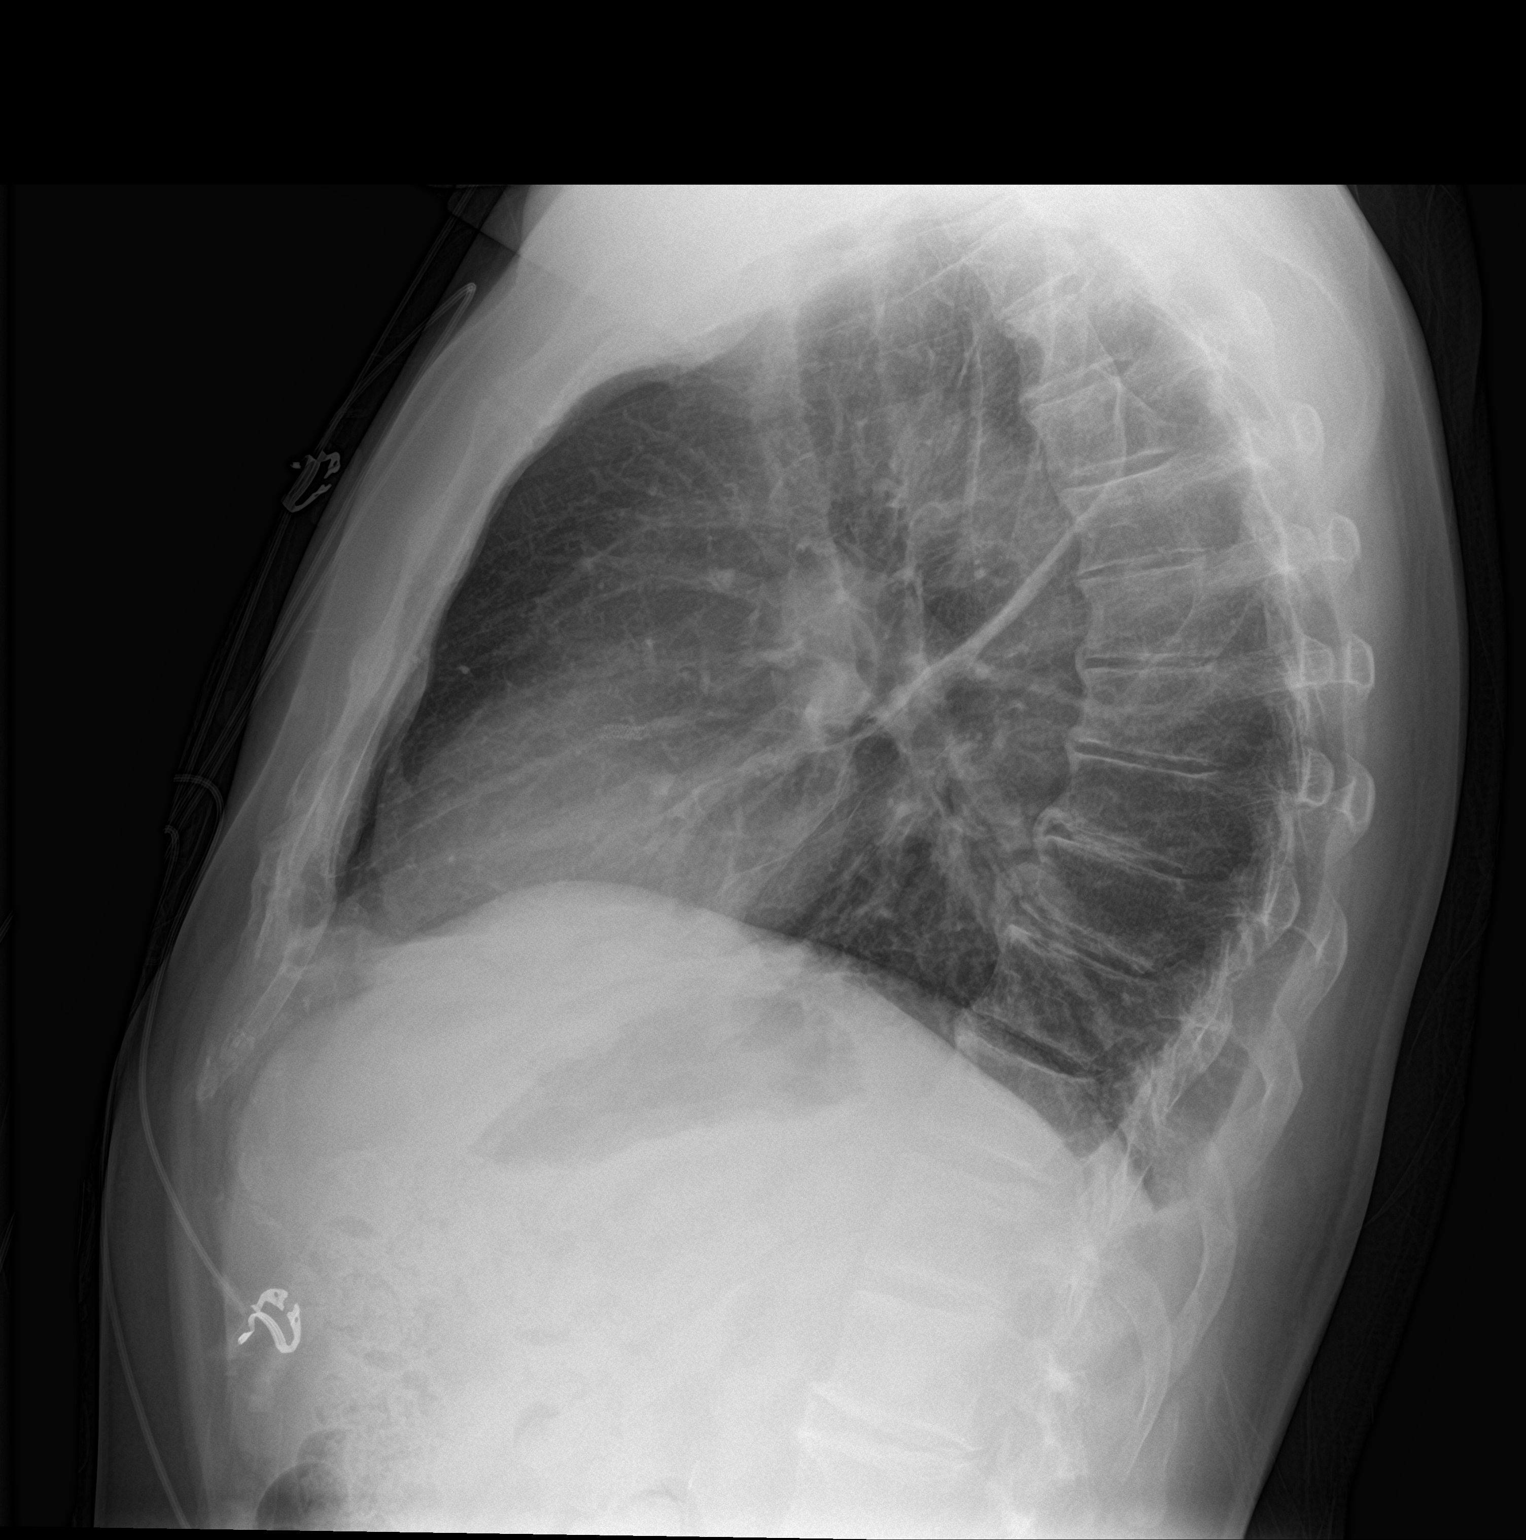

[2 of 2 positions shown; findings below may reference images not displayed]

FINDINGS: The volume of the left apical pneumothorax has not changed
significantly. Bibasilar atelectasis remains. Heart size is stable.
IMPRESSION: No change in left apical pneumothorax.

## 2017-11-12 ENCOUNTER — Encounter: Payer: Self-pay | Admitting: Oncology

## 2017-11-12 ENCOUNTER — Ambulatory Visit (HOSPITAL_BASED_OUTPATIENT_CLINIC_OR_DEPARTMENT_OTHER): Payer: 59 | Admitting: Oncology

## 2017-11-12 VITALS — BP 157/81 | HR 88 | Temp 97.7°F | Resp 18 | Ht 74.0 in | Wt 209.7 lb

## 2017-11-12 DIAGNOSIS — R911 Solitary pulmonary nodule: Secondary | ICD-10-CM | POA: Diagnosis not present

## 2017-11-12 DIAGNOSIS — R079 Chest pain, unspecified: Secondary | ICD-10-CM | POA: Diagnosis not present

## 2017-11-12 DIAGNOSIS — C182 Malignant neoplasm of ascending colon: Secondary | ICD-10-CM | POA: Diagnosis not present

## 2017-11-12 DIAGNOSIS — C787 Secondary malignant neoplasm of liver and intrahepatic bile duct: Secondary | ICD-10-CM

## 2017-11-12 NOTE — Progress Notes (Signed)
[facility] OFFICE PROGRESS NOTE   Diagnosis: Colon cancer  INTERVAL HISTORY:   Allen Glenn returns prior to his scheduled visit.  He complains of a dull constant discomfort at the mid anterior chest for the past 2 weeks.  The pain is worse with a deep breath and certain movements.  Aleve and antacids have not helped.  No nausea or vomiting.  No fever.  No shortness of breath.  The pain began after he fertilized his yard.  He lifted a 40 pound bags of fertilizer.  No known trauma.  Objective:  Vital signs in last 24 hours:  Blood pressure (!) 157/81, pulse 88, temperature 97.7 F (36.5 C), temperature source Oral, resp. rate 18, height _0  (1.88 m), weight 209 lb 11.2 oz (95.1 kg), SpO2 98 %.    HEENT: Neck without mass Lymphatics: No cervical or supraclavicular nodes Resp: Lungs with end inspiratory rhonchi at the right posterior base, otherwise clear, no respiratory distress Cardio: Regular rate and rhythm GI: No hepatomegaly, no mass, nontender Vascular: No leg edema Musculoskeletal: No rash, mass, or tenderness at the anterior chest wall, the area of discomfort localizes to the lower sternum    Lab Results:  Lab Results  Component Value Date   WBC 8.5 11/27/2016   HGB 14.9 11/27/2016   HCT 44.2 11/27/2016   MCV 89.3 11/27/2016   PLT 210 11/27/2016    CMP     Component Value Date/Time   NA 140 05/14/2017 1605   K 4.6 05/14/2017 1605   CL 105 11/27/2016 1324   CO2 25 05/14/2017 1605   GLUCOSE 102 05/14/2017 1605   BUN 12.5 05/14/2017 1605   CREATININE 0.9 05/14/2017 1605   CALCIUM 9.3 05/14/2017 1605   PROT 6.5 09/03/2017 0804   ALBUMIN 4.4 09/03/2017 0804   AST 24 09/03/2017 0804   ALT 39 09/03/2017 0804   ALKPHOS 81 09/03/2017 0804   BILITOT 0.3 09/03/2017 0804   GFRNONAA >60 11/27/2016 1324   GFRAA >60 11/27/2016 1324    Lab Results  Component Value Date   CEA1 2.87 09/03/2017    Lab Results  Component Value Date   INR 0.94  11/27/2016     Medications: I have reviewed the patient's current medications.  Assessment/Plan: 1. 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 No BRAFmutation  Liver biopsy 05/04/2015-metastatic adenocarcinoma consistent with a colon primary ? Staging PET scan 06/08/2015-isolated segment 4A liver lesion ? Initiation of adjuvant FOLFOX 06/13/2015 ? Restaging CT 08/09/2015 revealed a slight decrease in a borderline ileocolic node, decrease in the hepatic dome metastasis, no new lesions ? Liver resection 09/28/2015-pathology consistent with metastatic colon cancer, negative margins ? Adjuvant FOLFOX resumed 11/08/2015, oxaliplatin eliminated beginning 11/22/2015 secondary to neuropathy. He completed adjuvant chemotherapy 02/16/2016 ? Restaging chest CT 11/08/2016, compared to 05/07/2016 revealed a new 9 mm left upper lobe nodule, stable 2.2 cm right hepatic lesion ? PET scan 11/19/2016 confirmed a hypermetabolic left upper lobe nodule, hypermetabolic left paratracheal and pericardiac lymph nodes, and hypermetabolism associated with the hypoattenuating lesion in the dome of the liver ? Status post EBUSbiopsies of the left lingula nodule and a level 4Lnode on 11/28/2016-no evidence of malignancy ? CT chest 02/11/2017-increase in size of the left pulmonary nodule and epicardial lymph node ? Status post SBRT 2 the left lung nodule and mediastinum completed 03/14/2017 ? CTs 07/01/2017-new 9 mm focus along the right liver  capsule, stable left subcapsular liver lesion, radiation change at site of left upper lobe nodule, stable pericardial lymph node  Disposition:  [name] appears unchanged.  The chest discomfort is most likely related to a benign musculoskeletal condition, potentially secondary to an injury experienced while working in his yard.  The pain  may be related to toxicity from radiation.  He will contact us for increased pain or new symptoms.  He will return as scheduled for an office visit and restaging CT evaluation in 2 weeks.  It is possible the pain is related to metastatic colon cancer, but I think this is unlikely.  15 minutes were spent with the patient today.  The majority of the time was used for counseling and coordination of care.  Betsy Coder, MD  11/12/2017  11:51 AM

## 2017-11-12 NOTE — Telephone Encounter (Signed)
Called pt, he reports mid sternal pain began about a week ago. He notices it on deep inspiration or coughing. Denies fever or dyspnea. He has tried Aleve for past few days with no relief. Reviewed with [name], will work pt in to be seen today.

## 2017-11-19 ENCOUNTER — Other Ambulatory Visit (HOSPITAL_BASED_OUTPATIENT_CLINIC_OR_DEPARTMENT_OTHER): Payer: 59

## 2017-11-19 ENCOUNTER — Other Ambulatory Visit: Payer: Self-pay | Admitting: Emergency Medicine

## 2017-11-19 DIAGNOSIS — C182 Malignant neoplasm of ascending colon: Secondary | ICD-10-CM

## 2017-11-19 LAB — COMPREHENSIVE METABOLIC PANEL
ALK PHOS: 94 U/L (ref 40–150)
ALT: 58 U/L — ABNORMAL HIGH (ref 0–55)
AST: 34 U/L (ref 5–34)
Albumin: 4.1 g/dL (ref 3.5–5.0)
Anion Gap: 10 mEq/L (ref 3–11)
BUN: 13 mg/dL (ref 7.0–26.0)
CO2: 24 meq/L (ref 22–29)
Calcium: 8.9 mg/dL (ref 8.4–10.4)
Chloride: 103 mEq/L (ref 98–109)
Creatinine: 0.8 mg/dL (ref 0.7–1.3)
GLUCOSE: 189 mg/dL — AB (ref 70–140)
POTASSIUM: 4.2 meq/L (ref 3.5–5.1)
SODIUM: 137 meq/L (ref 136–145)
Total Bilirubin: 0.42 mg/dL (ref 0.20–1.20)
Total Protein: 7.1 g/dL (ref 6.4–8.3)

## 2017-11-19 LAB — CEA (IN HOUSE-CHCC): CEA (CHCC-In House): 4.66 ng/mL (ref 0.00–5.00)

## 2017-11-22 ENCOUNTER — Other Ambulatory Visit: Payer: 59

## 2017-11-22 MED FILL — TAMSULOSIN HCL 0.4 MG CAP: 0.4 | 90 days supply | Qty: 90 | Fill #1

## 2017-11-25 ENCOUNTER — Ambulatory Visit
Admission: RE | Admit: 2017-11-25 | Discharge: 2017-11-25 | Disposition: A | Discharge status: Home / Self Care | Payer: 59 | Source: Ambulatory Visit | Attending: Oncology | Admitting: Oncology

## 2017-11-25 DIAGNOSIS — R911 Solitary pulmonary nodule: Secondary | ICD-10-CM | POA: Diagnosis not present

## 2017-11-25 DIAGNOSIS — K8020 Calculus of gallbladder without cholecystitis without obstruction: Secondary | ICD-10-CM | POA: Diagnosis not present

## 2017-11-25 DIAGNOSIS — C182 Malignant neoplasm of ascending colon: Secondary | ICD-10-CM

## 2017-11-25 MED ORDER — IOPAMIDOL (ISOVUE-300) INJECTION 61%
100.0000 mL | Freq: Once | INTRAVENOUS | Status: AC | PRN
  Administered 2017-11-25: 100 mL via INTRAVENOUS

## 2017-11-26 ENCOUNTER — Telehealth: Payer: Self-pay | Admitting: Oncology

## 2017-11-26 ENCOUNTER — Ambulatory Visit (HOSPITAL_BASED_OUTPATIENT_CLINIC_OR_DEPARTMENT_OTHER): Payer: 59 | Admitting: Oncology

## 2017-11-26 ENCOUNTER — Encounter: Payer: Self-pay | Admitting: Oncology

## 2017-11-26 ENCOUNTER — Other Ambulatory Visit: Payer: Self-pay

## 2017-11-26 VITALS — BP 133/70 | HR 82 | Temp 97.8°F | Resp 18 | Ht 74.0 in | Wt 207.1 lb

## 2017-11-26 DIAGNOSIS — C182 Malignant neoplasm of ascending colon: Secondary | ICD-10-CM

## 2017-11-26 DIAGNOSIS — C787 Secondary malignant neoplasm of liver and intrahepatic bile duct: Secondary | ICD-10-CM | POA: Diagnosis not present

## 2017-11-26 DIAGNOSIS — E785 Hyperlipidemia, unspecified: Secondary | ICD-10-CM

## 2017-11-26 DIAGNOSIS — C7800 Secondary malignant neoplasm of unspecified lung: Secondary | ICD-10-CM | POA: Diagnosis not present

## 2017-11-26 DIAGNOSIS — I10 Essential (primary) hypertension: Secondary | ICD-10-CM

## 2017-11-26 DIAGNOSIS — I2510 Atherosclerotic heart disease of native coronary artery without angina pectoris: Secondary | ICD-10-CM

## 2017-11-26 DIAGNOSIS — I252 Old myocardial infarction: Secondary | ICD-10-CM

## 2017-11-26 NOTE — Progress Notes (Signed)
Patient here for follow up and results no changes since last appt.

## 2017-11-26 NOTE — Telephone Encounter (Signed)
Gave avs and calendar for March 2019 °

## 2017-11-26 NOTE — Progress Notes (Signed)
[facility] OFFICE PROGRESS NOTE   Diagnosis: Colon cancer  INTERVAL HISTORY:   [facility] Glenn returns as scheduled.  The chest discomfort he experienced last month has resolved.  He feels well.  No complaint.  Objective:  Vital signs in last 24 hours:  Blood pressure 133/70, pulse 82, temperature 97.8 F (36.6 C), temperature source Oral, resp. rate 18, height _0  (1.88 m), weight 207 lb 1.6 oz (93.9 kg), SpO2 100 %.    HEENT: Neck without mass Resp: Lungs clear bilaterally Cardio: Regular rate and rhythm GI: No hepatomegaly, nontender Vascular: No leg edema  Lab Results:   CMP     Component Value Date/Time   NA 137 11/19/2017 0822   K 4.2 11/19/2017 0822   CL 105 11/27/2016 1324   CO2 24 11/19/2017 0822   GLUCOSE 189 (H) 11/19/2017 0822   BUN 13.0 11/19/2017 0822   CREATININE 0.8 11/19/2017 0822   CALCIUM 8.9 11/19/2017 0822   PROT 7.1 11/19/2017 0822   ALBUMIN 4.1 11/19/2017 0822   AST 34 11/19/2017 0822   ALT 58 (H) 11/19/2017 0822   ALKPHOS 94 11/19/2017 0822   BILITOT 0.42 11/19/2017 0822   GFRNONAA >60 11/27/2016 1324   GFRAA >60 11/27/2016 1324    Lab Results  Component Value Date   CEA1 4.66 11/19/2017     Imaging:  Ct Chest W Contrast  Result Date: 11/25/2017 CLINICAL DATA:  Ascending colonic neoplasm. Left upper lobe lung nodule. Liver lesion. Colon resection 06/13/2016. Radiation therapy in 2018. Partial hepatectomy. EXAM: CT CHEST, ABDOMEN, AND PELVIS WITH CONTRAST TECHNIQUE: Multidetector CT imaging of the chest, abdomen and pelvis was performed following the standard protocol during bolus administration of intravenous contrast. CONTRAST:  124m ISOVUE-300 IOPAMIDOL (ISOVUE-300) INJECTION 61% COMPARISON:  07/01/2017 FINDINGS: CT CHEST FINDINGS Cardiovascular: Aortic and branch vessel atherosclerosis. Normal heart size, without pericardial effusion. Lad stent. No central pulmonary embolism, on this non-dedicated study.  Mediastinum/Nodes: No supraclavicular adenopathy. No mediastinal or hilar adenopathy. Tiny hiatal hernia. Left cardiophrenic angle node measures 7 mm today versus 8 mm on the prior. Lungs/Pleura: No pleural fluid. Mild centrilobular emphysema. Progressive inferior left upper lobe radiation change with volume loss. A new lingular 5 mm pulmonary nodule on image 77/ series 6. Musculoskeletal: No acute osseous abnormality. CT ABDOMEN PELVIS FINDINGS Hepatobiliary: Moderate to marked hepatic steatosis. Hepatomegaly at 21.5 cm. This makes evaluation for focal liver lesions challenging. Capsular based right liver lobe lesion is enlarged, 1.7 cm on image 110/series 2. Compare 11 mm on the prior exam (when remeasured). Immediately inferior capsular based right liver lobe lesion measures 12 mm on image 108/series 2 and is new. Possible capsular based lesions along the hepatic dome including on image 66/series 2. Tiny gallstone without acute cholecystitis or biliary duct dilatation. Pancreas: Normal, without mass or ductal dilatation. Spleen: Normal in size, without focal abnormality. Adrenals/Urinary Tract: Normal adrenal glands. An interpolar right renal lesion is too small to characterize at 9 mm but likely a cyst or minimally complex cyst. Normal left kidney. Apparent mild bladder wall thickening is favored to be due to underdistention. Stomach/Bowel: Normal remainder of the stomach. Right hemicolectomy. Normal small bowel. Vascular/Lymphatic: Aortic and branch vessel atherosclerosis. No abdominopelvic adenopathy. Reproductive: Normal prostate. Other: No significant free fluid. Musculoskeletal: No acute osseous abnormality. IMPRESSION: CT CHEST IMPRESSION 1. Evolving radiation change in the inferior left upper lobe. A lingular 5 mm nodule is new since the prior exam and highly suspicious for new pulmonary metastasis. 2. No  thoracic adenopathy. 3. Coronary artery atherosclerosis. Aortic Atherosclerosis (ICD10-I70.0). 4.   Tiny hiatal hernia. CT ABDOMEN AND PELVIS IMPRESSION 1. Progressive capsular based hepatic metastasis. 2. Cholelithiasis. 3. Hepatic steatosis and hepatomegaly. This makes evaluation of focal liver lesions challenging. If optimal surveillance for liver metastatic burden is desired, the test of choice is pre and postcontrast MRI. Electronically Signed   By: Abigail Miyamoto M.D.   On: 11/25/2017 08:48   Ct Abdomen Pelvis W Contrast  Result Date: 11/25/2017 CLINICAL DATA:  Ascending colonic neoplasm. Left upper lobe lung nodule. Liver lesion. Colon resection 06/13/2016. Radiation therapy in 2018. Partial hepatectomy. EXAM: CT CHEST, ABDOMEN, AND PELVIS WITH CONTRAST TECHNIQUE: Multidetector CT imaging of the chest, abdomen and pelvis was performed following the standard protocol during bolus administration of intravenous contrast. CONTRAST:  164m ISOVUE-300 IOPAMIDOL (ISOVUE-300) INJECTION 61% COMPARISON:  07/01/2017 FINDINGS: CT CHEST FINDINGS Cardiovascular: Aortic and branch vessel atherosclerosis. Normal heart size, without pericardial effusion. Lad stent. No central pulmonary embolism, on this non-dedicated study. Mediastinum/Nodes: No supraclavicular adenopathy. No mediastinal or hilar adenopathy. Tiny hiatal hernia. Left cardiophrenic angle node measures 7 mm today versus 8 mm on the prior. Lungs/Pleura: No pleural fluid. Mild centrilobular emphysema. Progressive inferior left upper lobe radiation change with volume loss. A new lingular 5 mm pulmonary nodule on image 77/ series 6. Musculoskeletal: No acute osseous abnormality. CT ABDOMEN PELVIS FINDINGS Hepatobiliary: Moderate to marked hepatic steatosis. Hepatomegaly at 21.5 cm. This makes evaluation for focal liver lesions challenging. Capsular based right liver lobe lesion is enlarged, 1.7 cm on image 110/series 2. Compare 11 mm on the prior exam (when remeasured). Immediately inferior capsular based right liver lobe lesion measures 12 mm on image  108/series 2 and is new. Possible capsular based lesions along the hepatic dome including on image 66/series 2. Tiny gallstone without acute cholecystitis or biliary duct dilatation. Pancreas: Normal, without mass or ductal dilatation. Spleen: Normal in size, without focal abnormality. Adrenals/Urinary Tract: Normal adrenal glands. An interpolar right renal lesion is too small to characterize at 9 mm but likely a cyst or minimally complex cyst. Normal left kidney. Apparent mild bladder wall thickening is favored to be due to underdistention. Stomach/Bowel: Normal remainder of the stomach. Right hemicolectomy. Normal small bowel. Vascular/Lymphatic: Aortic and branch vessel atherosclerosis. No abdominopelvic adenopathy. Reproductive: Normal prostate. Other: No significant free fluid. Musculoskeletal: No acute osseous abnormality. IMPRESSION: CT CHEST IMPRESSION 1. Evolving radiation change in the inferior left upper lobe. A lingular 5 mm nodule is new since the prior exam and highly suspicious for new pulmonary metastasis. 2. No thoracic adenopathy. 3. Coronary artery atherosclerosis. Aortic Atherosclerosis (ICD10-I70.0). 4.  Tiny hiatal hernia. CT ABDOMEN AND PELVIS IMPRESSION 1. Progressive capsular based hepatic metastasis. 2. Cholelithiasis. 3. Hepatic steatosis and hepatomegaly. This makes evaluation of focal liver lesions challenging. If optimal surveillance for liver metastatic burden is desired, the test of choice is pre and postcontrast MRI. Electronically Signed   By: KAbigail MiyamotoM.D.   On: 11/25/2017 08:48    Medications: I have reviewed the patient's current medications.  Assessment/Plan: 1. 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 No BRAFmutation  Liver biopsy 05/04/2015-metastatic adenocarcinoma consistent with a colon primary ? Staging PET scan  06/08/2015-isolated segment 4A liver lesion ? Initiation of adjuvant FOLFOX 06/13/2015 ? Restaging CT 08/09/2015 revealed a slight decrease in a borderline ileocolic node, decrease in the hepatic dome metastasis, no new lesions ?  Liver resection 09/28/2015-pathology consistent with metastatic colon cancer, negative margins ? Adjuvant FOLFOX resumed 11/08/2015, oxaliplatin eliminated beginning 11/22/2015 secondary to neuropathy. He completed adjuvant chemotherapy 02/16/2016 ? Restaging chest CT 11/08/2016, compared to 05/07/2016 revealed a new 9 mm left upper lobe nodule, stable 2.2 cm right hepatic lesion ? PET scan 11/19/2016 confirmed a hypermetabolic left upper lobe nodule, hypermetabolic left paratracheal and pericardiac lymph nodes, and hypermetabolism associated with the hypoattenuating lesion in the dome of the liver ? Status post EBUSbiopsies of the left lingula nodule and a level 4Lnode on 11/28/2016-no evidence of malignancy ? CT chest 02/11/2017-increase in size of the left pulmonary nodule and epicardial lymph node ? Status post SBRT 2 the left lung nodule and mediastinum completed 03/14/2017 ? CTs 07/01/2017-new 9 mm focus along the right liver capsule, stable left subcapsular liver lesion, radiation change at site of left upper lobe nodule, stable pericardiallymph node ? CTs 11/25/2017-new 5 mm lingular nodule, enlargement of capsular-based right liver lesion, new capsular based right liver lesion capsular lesion at the hepatic dome 2. Coronary artery disease status post a myocardial infarction in 2014  3. Hypertension  4. Hyperlipidemia     Disposition:  Allen Glenn has metastatic colon cancer.  The CT 11/25/2017 reveals evidence of disease progression in the lungs and liver.  He is asymptomatic and has a small tumor burden.  I reviewed the CT images and discussed treatment options with Allen Glenn and his wife.  There are multiple systemic treatment options, though no  therapy will be curative.  We decided to follow him with observation.  He will return for an office visit and restaging CT evaluation in 3 months.  He will contact us in the interim for new symptoms.  25 minutes were spent with the patient today.  The majority of the time was used for counseling and coordination of care.  Betsy Coder, MD  11/26/2017  8:49 AM

## 2017-11-28 DIAGNOSIS — J069 Acute upper respiratory infection, unspecified: Secondary | ICD-10-CM | POA: Diagnosis not present

## 2017-11-28 MED FILL — AMOXICILLIN 500 MG CAPSULE: 500 | 10 days supply | Qty: 20 | Fill #0

## 2017-12-03 MED FILL — LISINOPRIL 2.5 MG TABLET: 2.5 | 90 days supply | Qty: 90 | Fill #1

## 2017-12-09 DIAGNOSIS — H52223 Regular astigmatism, bilateral: Secondary | ICD-10-CM | POA: Diagnosis not present

## 2018-01-09 MED FILL — ATORVASTATIN 10 MG TABLET: 10 | 90 days supply | Qty: 90 | Fill #0

## 2018-01-09 MED FILL — NITROGLYCERIN 0.4 MG TAB SL: 0.4 | 25 days supply | Qty: 25 | Fill #0

## 2018-02-03 ENCOUNTER — Encounter: Payer: Self-pay | Admitting: Oncology

## 2018-02-03 ENCOUNTER — Encounter: Payer: Self-pay | Admitting: Interventional Cardiology

## 2018-02-24 ENCOUNTER — Inpatient Hospital Stay: Payer: 59 | Attending: Oncology

## 2018-02-24 DIAGNOSIS — C787 Secondary malignant neoplasm of liver and intrahepatic bile duct: Secondary | ICD-10-CM | POA: Insufficient documentation

## 2018-02-24 DIAGNOSIS — I252 Old myocardial infarction: Secondary | ICD-10-CM | POA: Insufficient documentation

## 2018-02-24 DIAGNOSIS — I2510 Atherosclerotic heart disease of native coronary artery without angina pectoris: Secondary | ICD-10-CM | POA: Insufficient documentation

## 2018-02-24 DIAGNOSIS — C182 Malignant neoplasm of ascending colon: Secondary | ICD-10-CM | POA: Insufficient documentation

## 2018-02-24 DIAGNOSIS — E785 Hyperlipidemia, unspecified: Secondary | ICD-10-CM | POA: Diagnosis not present

## 2018-02-24 DIAGNOSIS — I10 Essential (primary) hypertension: Secondary | ICD-10-CM | POA: Insufficient documentation

## 2018-02-24 LAB — COMPREHENSIVE METABOLIC PANEL
ALK PHOS: 121 U/L (ref 40–150)
ALT: 35 U/L (ref 0–55)
ANION GAP: 11 (ref 3–11)
AST: 23 U/L (ref 5–34)
Albumin: 4 g/dL (ref 3.5–5.0)
BUN: 11 mg/dL (ref 7–26)
CALCIUM: 9.5 mg/dL (ref 8.4–10.4)
CO2: 25 mmol/L (ref 22–29)
CREATININE: 0.93 mg/dL (ref 0.70–1.30)
Chloride: 101 mmol/L (ref 98–109)
Glucose, Bld: 206 mg/dL — ABNORMAL HIGH (ref 70–140)
Potassium: 4.5 mmol/L (ref 3.5–5.1)
Sodium: 137 mmol/L (ref 136–145)
Total Bilirubin: 0.5 mg/dL (ref 0.2–1.2)
Total Protein: 7.2 g/dL (ref 6.4–8.3)

## 2018-02-24 LAB — CEA (IN HOUSE-CHCC): CEA (CHCC-IN HOUSE): 9.4 ng/mL — AB (ref 0.00–5.00)

## 2018-02-25 ENCOUNTER — Ambulatory Visit
Admission: RE | Admit: 2018-02-25 | Discharge: 2018-02-25 | Disposition: A | Discharge status: Home / Self Care | Payer: 59 | Source: Ambulatory Visit | Attending: Oncology | Admitting: Oncology

## 2018-02-25 DIAGNOSIS — K8020 Calculus of gallbladder without cholecystitis without obstruction: Secondary | ICD-10-CM | POA: Diagnosis not present

## 2018-02-25 DIAGNOSIS — C182 Malignant neoplasm of ascending colon: Secondary | ICD-10-CM

## 2018-02-25 DIAGNOSIS — R918 Other nonspecific abnormal finding of lung field: Secondary | ICD-10-CM | POA: Diagnosis not present

## 2018-02-25 MED ORDER — IOPAMIDOL (ISOVUE-300) INJECTION 61%
100.0000 mL | Freq: Once | INTRAVENOUS | Status: AC | PRN
  Administered 2018-02-25: 100 mL via INTRAVENOUS

## 2018-02-27 ENCOUNTER — Inpatient Hospital Stay (HOSPITAL_BASED_OUTPATIENT_CLINIC_OR_DEPARTMENT_OTHER): Payer: 59 | Admitting: Oncology

## 2018-02-27 ENCOUNTER — Telehealth: Payer: Self-pay | Admitting: Oncology

## 2018-02-27 VITALS — BP 138/84 | HR 83 | Temp 98.0°F | Resp 18 | Ht 74.0 in | Wt 199.5 lb

## 2018-02-27 DIAGNOSIS — I10 Essential (primary) hypertension: Secondary | ICD-10-CM | POA: Diagnosis not present

## 2018-02-27 DIAGNOSIS — C182 Malignant neoplasm of ascending colon: Secondary | ICD-10-CM | POA: Diagnosis not present

## 2018-02-27 DIAGNOSIS — C787 Secondary malignant neoplasm of liver and intrahepatic bile duct: Secondary | ICD-10-CM | POA: Diagnosis not present

## 2018-02-27 DIAGNOSIS — E785 Hyperlipidemia, unspecified: Secondary | ICD-10-CM

## 2018-02-27 DIAGNOSIS — I2510 Atherosclerotic heart disease of native coronary artery without angina pectoris: Secondary | ICD-10-CM | POA: Diagnosis not present

## 2018-02-27 DIAGNOSIS — I252 Old myocardial infarction: Secondary | ICD-10-CM | POA: Diagnosis not present

## 2018-02-27 NOTE — Progress Notes (Signed)
[facility] OFFICE PROGRESS NOTE   Diagnosis: Colon cancer  INTERVAL HISTORY:   [facility] Glenn returns as scheduled.  He reports feeling well aside from a current "cold ".  Good appetite and energy level.  He reports intentional weight loss.  Objective:  Vital signs in last 24 hours:  Blood pressure 138/84, pulse 83, temperature 98 F (36.7 C), temperature source Oral, resp. rate 18, height '6\' 2"'$  (1.88 m), weight 199 lb 8 oz (90.5 kg), SpO2 100 %.    HEENT: Neck without mass Lymphatics: No cervical or supraclavicular nodes Resp: Lungs clear bilaterally Cardio: Regular rate and rhythm GI: No hepatomegaly, no mass, nontender Vascular: No leg edema   Lab Results:    Lab Results  Component Value Date   CEA1 9.40 (H) 02/24/2018    Imaging:  Ct Chest W Contrast  Result Date: 02/25/2018 CLINICAL DATA:  Patient with history of colon cancer. Follow-up evaluation. EXAM: CT CHEST, ABDOMEN, AND PELVIS WITH CONTRAST TECHNIQUE: Multidetector CT imaging of the chest, abdomen and pelvis was performed following the standard protocol during bolus administration of intravenous contrast. Creatinine was obtained on site at [facility] at [address] Results: Creatinine 0.8 mg/dL. CONTRAST:  156m ISOVUE-300 IOPAMIDOL (ISOVUE-300) INJECTION 61% COMPARISON:  CT CAP 11/25/2017. FINDINGS: CT CHEST FINDINGS Cardiovascular: Normal heart size. No pericardial effusion. Coronary arterial vascular calcifications. Thoracic aortic vascular calcifications. Mediastinum/Nodes: No enlarged axillary, mediastinal or hilar lymphadenopathy. Small hiatal hernia. Normal esophagus. Lungs/Pleura: Central airways are patent. Mild centrilobular emphysematous change. We demonstrated volume loss within the lingula. Interval increase in size of lingular nodule measuring 7 mm (image 87; series 6), previously 4 mm. New 3 mm subpleural left upper lobe nodule (image 66; series 6). Similar 2 mm left upper  lobe nodule (image 78; series 6). Dependent atelectasis right lower lobe. Interval increase in size of 5 mm right lower lobe nodule (image 68; series 6), previously 2 mm. Interval increase in size of right upper lobe nodule measuring 6 mm (image 60; series 6), previously 3 mm. No pleural effusion or pneumothorax. Musculoskeletal: Thoracic spine degenerative changes. No aggressive or acute appearing osseous lesions. CT ABDOMEN PELVIS FINDINGS Hepatobiliary: Slight interval increase in size of subcapsular right hepatic lobe lesion measuring 2.3 cm (image 67; series 2), previously 1.7 cm. Interval increase in size of subcapsular lesion overlying the left hepatic lobe measuring 2.0 x 1.9 cm (image 46; series 2), previously 1.3 x 1.1 cm. Interval increase in size of lesion within the hepatic dome measuring 1.7 x 1.8 cm (image 45; series 2), previously 1.7 x 1.2 cm. Pancreas: Small stones within the gallbladder lumen. No intrahepatic or extrahepatic biliary ductal dilatation. Spleen: Unremarkable Adrenals/Urinary Tract: Adrenal glands are normal. Kidneys enhance symmetrically with contrast. No hydronephrosis. Stable subcentimeter low-attenuation lesion inferior pole right kidney. Urinary bladder is unremarkable. No hydronephrosis. Stomach/Bowel: Oral contrast material to the level of the rectum. Patient status post right hemicolectomy. Normal stomach. No free fluid or free intraperitoneal air. Vascular/Lymphatic: Normal caliber abdominal aorta. Peripheral calcified atherosclerotic plaque. No retroperitoneal lymphadenopathy. Slight interval increase in size of right lower quadrant mesenteric node measuring 9 mm (image 60; series 3), previously 7 mm. Reproductive: Unremarkable prostate. Other: Small bilateral fat containing inguinal hernias. Musculoskeletal: Lumbar spine degenerative changes. No aggressive or acute appearing osseous lesions. IMPRESSION: 1. Interval increase in size of bilateral pulmonary nodules concerning  for metastatic disease. 2. Interval increase in size of capsular based hepatic metastatic disease. 3. Mild interval increase in size of  right lower quadrant mesenteric lymph node. Recommend attention on follow-up. 4. Cholelithiasis. Electronically Signed   By: Lovey Newcomer M.D.   On: 02/25/2018 10:23   Ct Abdomen Pelvis W Contrast  Result Date: 02/25/2018 CLINICAL DATA:  Patient with history of colon cancer. Follow-up evaluation. EXAM: CT CHEST, ABDOMEN, AND PELVIS WITH CONTRAST TECHNIQUE: Multidetector CT imaging of the chest, abdomen and pelvis was performed following the standard protocol during bolus administration of intravenous contrast. Creatinine was obtained on site at [facility] at [address] Results: Creatinine 0.8 mg/dL. CONTRAST:  149m ISOVUE-300 IOPAMIDOL (ISOVUE-300) INJECTION 61% COMPARISON:  CT CAP 11/25/2017. FINDINGS: CT CHEST FINDINGS Cardiovascular: Normal heart size. No pericardial effusion. Coronary arterial vascular calcifications. Thoracic aortic vascular calcifications. Mediastinum/Nodes: No enlarged axillary, mediastinal or hilar lymphadenopathy. Small hiatal hernia. Normal esophagus. Lungs/Pleura: Central airways are patent. Mild centrilobular emphysematous change. We demonstrated volume loss within the lingula. Interval increase in size of lingular nodule measuring 7 mm (image 87; series 6), previously 4 mm. New 3 mm subpleural left upper lobe nodule (image 66; series 6). Similar 2 mm left upper lobe nodule (image 78; series 6). Dependent atelectasis right lower lobe. Interval increase in size of 5 mm right lower lobe nodule (image 68; series 6), previously 2 mm. Interval increase in size of right upper lobe nodule measuring 6 mm (image 60; series 6), previously 3 mm. No pleural effusion or pneumothorax. Musculoskeletal: Thoracic spine degenerative changes. No aggressive or acute appearing osseous lesions. CT ABDOMEN PELVIS FINDINGS Hepatobiliary: Slight interval  increase in size of subcapsular right hepatic lobe lesion measuring 2.3 cm (image 67; series 2), previously 1.7 cm. Interval increase in size of subcapsular lesion overlying the left hepatic lobe measuring 2.0 x 1.9 cm (image 46; series 2), previously 1.3 x 1.1 cm. Interval increase in size of lesion within the hepatic dome measuring 1.7 x 1.8 cm (image 45; series 2), previously 1.7 x 1.2 cm. Pancreas: Small stones within the gallbladder lumen. No intrahepatic or extrahepatic biliary ductal dilatation. Spleen: Unremarkable Adrenals/Urinary Tract: Adrenal glands are normal. Kidneys enhance symmetrically with contrast. No hydronephrosis. Stable subcentimeter low-attenuation lesion inferior pole right kidney. Urinary bladder is unremarkable. No hydronephrosis. Stomach/Bowel: Oral contrast material to the level of the rectum. Patient status post right hemicolectomy. Normal stomach. No free fluid or free intraperitoneal air. Vascular/Lymphatic: Normal caliber abdominal aorta. Peripheral calcified atherosclerotic plaque. No retroperitoneal lymphadenopathy. Slight interval increase in size of right lower quadrant mesenteric node measuring 9 mm (image 60; series 3), previously 7 mm. Reproductive: Unremarkable prostate. Other: Small bilateral fat containing inguinal hernias. Musculoskeletal: Lumbar spine degenerative changes. No aggressive or acute appearing osseous lesions. IMPRESSION: 1. Interval increase in size of bilateral pulmonary nodules concerning for metastatic disease. 2. Interval increase in size of capsular based hepatic metastatic disease. 3. Mild interval increase in size of right lower quadrant mesenteric lymph node. Recommend attention on follow-up. 4. Cholelithiasis. Electronically Signed   By: DLovey NewcomerM.D.   On: 02/25/2018 10:23    Medications: I have reviewed the patient's current medications.   Assessment/Plan: 1. Stage IV (pT3,pN2b,M1) sees moderately differentiated adenocarcinoma of the  right colon, status post a right colectomy 05/04/2015, Foundation 1 testing-MSI-stable, K-ras G12Cmutations. No BRAFmutation  Liver biopsy 05/04/2015-metastatic adenocarcinoma consistent with a colon primary ? Staging PET scan 06/08/2015-isolated segment 4A liver lesion ? Initiation of adjuvant FOLFOX 06/13/2015 ? Restaging CT 08/09/2015 revealed a slight decrease in a borderline ileocolic node, decrease in the hepatic dome metastasis, no new lesions ?  Liver resection 09/28/2015-pathology consistent with metastatic colon cancer, negative margins ? Adjuvant FOLFOX resumed 11/08/2015, oxaliplatin eliminated beginning 11/22/2015 secondary to neuropathy. He completed adjuvant chemotherapy 02/16/2016 ? Restaging chest CT 11/08/2016, compared to 05/07/2016 revealed a new 9 mm left upper lobe nodule, stable 2.2 cm right hepatic lesion ? PET scan 11/19/2016 confirmed a hypermetabolic left upper lobe nodule, hypermetabolic left paratracheal and pericardiac lymph nodes, and hypermetabolism associated with the hypoattenuating lesion in the dome of the liver ? Status post EBUSbiopsies of the left lingula nodule and a level 4Lnode on 11/28/2016-no evidence of malignancy ? CT chest 02/11/2017-increase in size of the left pulmonary nodule and epicardial lymph node ? Status post SBRT 2 the left lung nodule and mediastinum completed 03/14/2017 ? CTs 07/01/2017-new 9 mm focus along the right liver capsule, stable left subcapsular liver lesion, radiation change at site of left upper lobe nodule, stable pericardiallymph node ? CTs 11/25/2017-new 5 mm lingular nodule, enlargement of capsular-based right liver lesion, new capsular based right liver lesion capsular lesion at the hepatic dome ? CTs 02/25/2018- enlargement of small lung nodules, liver lesions and small mesenteric lymph node 2. Coronary artery disease status post a myocardial infarction in 2014  3. Hypertension  4.  Hyperlipidemia  Disposition: Allen Glenn appears well.  He appears to have slow progression of metastatic colon cancer.  I discussed the CT findings and reviewed the images with Allen Glenn and his wife.  We discussed continued observation versus salvage systemic therapy. He appears asymptomatic from the colon cancer.  The lesions in the lungs and liver are small.  We decided to continue observation.  He will return for office visit and restaging CT evaluation in approximately 3-1/2 months.  25 minutes were spent with the patient today.  The majority of the time was used for counseling and coordination of care.  Betsy Coder, MD  02/27/2018  8:28 AM

## 2018-02-27 NOTE — Telephone Encounter (Signed)
Gave patient calendar with appts per 3/7 los.  °

## 2018-03-04 MED FILL — LISINOPRIL 2.5 MG TABLET: 2.5 | 90 days supply | Qty: 90 | Fill #0

## 2018-03-07 DIAGNOSIS — J069 Acute upper respiratory infection, unspecified: Secondary | ICD-10-CM | POA: Diagnosis not present

## 2018-03-07 DIAGNOSIS — I10 Essential (primary) hypertension: Secondary | ICD-10-CM | POA: Diagnosis not present

## 2018-03-07 DIAGNOSIS — N400 Enlarged prostate without lower urinary tract symptoms: Secondary | ICD-10-CM | POA: Diagnosis not present

## 2018-03-07 MED FILL — TAMSULOSIN HCL 0.4 MG CAP: 0.4 | 90 days supply | Qty: 90 | Fill #0

## 2018-03-19 DIAGNOSIS — I10 Essential (primary) hypertension: Secondary | ICD-10-CM | POA: Diagnosis not present

## 2018-03-19 DIAGNOSIS — Z0000 Encounter for general adult medical examination without abnormal findings: Secondary | ICD-10-CM | POA: Diagnosis not present

## 2018-03-19 DIAGNOSIS — I2510 Atherosclerotic heart disease of native coronary artery without angina pectoris: Secondary | ICD-10-CM | POA: Diagnosis not present

## 2018-03-19 DIAGNOSIS — E782 Mixed hyperlipidemia: Secondary | ICD-10-CM | POA: Diagnosis not present

## 2018-03-19 DIAGNOSIS — N400 Enlarged prostate without lower urinary tract symptoms: Secondary | ICD-10-CM | POA: Diagnosis not present

## 2018-03-19 DIAGNOSIS — Z125 Encounter for screening for malignant neoplasm of prostate: Secondary | ICD-10-CM | POA: Diagnosis not present

## 2018-03-19 DIAGNOSIS — Z131 Encounter for screening for diabetes mellitus: Secondary | ICD-10-CM | POA: Diagnosis not present

## 2018-03-19 DIAGNOSIS — M62838 Other muscle spasm: Secondary | ICD-10-CM | POA: Diagnosis not present

## 2018-03-20 DIAGNOSIS — R7309 Other abnormal glucose: Secondary | ICD-10-CM | POA: Diagnosis not present

## 2018-03-26 MED FILL — METFORMIN HCL ER 500 MG TAB: 500 | 30 days supply | Qty: 60 | Fill #0

## 2018-04-22 ENCOUNTER — Telehealth: Payer: Self-pay | Admitting: Interventional Cardiology

## 2018-04-22 NOTE — Telephone Encounter (Signed)
Spoke with [name] (spouse), DPR on file.  She states pt had an episode on Sunday where he became pale, was weak and had some neck pain.  Resolved after a little while and did not occur again until today.  Pt recently diagnosed with diabetes.  Takes Metformin for this.  Wife states blood sugars have been fine.  Denies CP, SOB, dizziness, lightheadedness, cough or fever.  Advised wife to reach out to pt's PCP to see if he can be seen.  Wife concerned about pt's stents.  Advised sx do not indicate stent issue.  Advised wife to contact PCP and once they eval pt, if they feel he needs to see cardiology, let us know.

## 2018-04-22 NOTE — Telephone Encounter (Signed)
New message    Patient spouse calling with concerns about neck pain, weakness and pallor. No chest pain, no shortness

## 2018-04-24 DIAGNOSIS — M62838 Other muscle spasm: Secondary | ICD-10-CM | POA: Diagnosis not present

## 2018-04-25 ENCOUNTER — Ambulatory Visit: Payer: 59 | Admitting: *Deleted

## 2018-04-25 NOTE — Telephone Encounter (Signed)
Agree 

## 2018-05-02 DIAGNOSIS — E1165 Type 2 diabetes mellitus with hyperglycemia: Secondary | ICD-10-CM | POA: Diagnosis not present

## 2018-05-02 DIAGNOSIS — Z1389 Encounter for screening for other disorder: Secondary | ICD-10-CM | POA: Diagnosis not present

## 2018-05-02 DIAGNOSIS — I255 Ischemic cardiomyopathy: Secondary | ICD-10-CM | POA: Diagnosis not present

## 2018-05-02 DIAGNOSIS — C189 Malignant neoplasm of colon, unspecified: Secondary | ICD-10-CM | POA: Diagnosis not present

## 2018-05-02 DIAGNOSIS — J984 Other disorders of lung: Secondary | ICD-10-CM | POA: Diagnosis not present

## 2018-05-02 DIAGNOSIS — Z6826 Body mass index (BMI) 26.0-26.9, adult: Secondary | ICD-10-CM | POA: Diagnosis not present

## 2018-05-02 DIAGNOSIS — I2510 Atherosclerotic heart disease of native coronary artery without angina pectoris: Secondary | ICD-10-CM | POA: Diagnosis not present

## 2018-05-02 DIAGNOSIS — E7849 Other hyperlipidemia: Secondary | ICD-10-CM | POA: Diagnosis not present

## 2018-05-02 DIAGNOSIS — I10 Essential (primary) hypertension: Secondary | ICD-10-CM | POA: Diagnosis not present

## 2018-05-02 MED FILL — metFORMIN HCL 1000 MG TABS: 1000 | 90 days supply | Qty: 180 | Fill #0

## 2018-05-12 DIAGNOSIS — R110 Nausea: Secondary | ICD-10-CM | POA: Diagnosis not present

## 2018-05-14 ENCOUNTER — Other Ambulatory Visit: Payer: Self-pay | Admitting: Endocrinology

## 2018-05-14 DIAGNOSIS — C189 Malignant neoplasm of colon, unspecified: Secondary | ICD-10-CM

## 2018-05-15 ENCOUNTER — Ambulatory Visit
Admission: RE | Admit: 2018-05-15 | Discharge: 2018-05-15 | Disposition: A | Discharge status: Home / Self Care | Payer: 59 | Source: Ambulatory Visit | Attending: Endocrinology | Admitting: Endocrinology

## 2018-05-15 DIAGNOSIS — C189 Malignant neoplasm of colon, unspecified: Secondary | ICD-10-CM

## 2018-05-15 MED ORDER — IOPAMIDOL (ISOVUE-300) INJECTION 61%: 75.0000 mL | Freq: Once | INTRAVENOUS | Status: DC | PRN

## 2018-05-15 MED ORDER — IOPAMIDOL (ISOVUE-300) INJECTION 61%
100.0000 mL | Freq: Once | INTRAVENOUS | Status: AC | PRN
  Administered 2018-05-15: 100 mL via INTRAVENOUS

## 2018-05-16 ENCOUNTER — Telehealth: Payer: Self-pay | Admitting: *Deleted

## 2018-05-16 DIAGNOSIS — R110 Nausea: Secondary | ICD-10-CM | POA: Diagnosis not present

## 2018-05-16 NOTE — Telephone Encounter (Signed)
"  My husband needs an appointment scheduled for next week.  Dr. Forde Dandy has referred him back to Dr Benay Spice after being diagnosed with pancreatitis and more by PCP.  Has Dr.Sherrill received this information?"  Forwarded to collaborative to assist with this request.

## 2018-05-19 ENCOUNTER — Other Ambulatory Visit: Payer: Self-pay

## 2018-05-19 ENCOUNTER — Encounter: Payer: Self-pay | Admitting: Oncology

## 2018-05-19 ENCOUNTER — Encounter (HOSPITAL_BASED_OUTPATIENT_CLINIC_OR_DEPARTMENT_OTHER): Payer: Self-pay | Admitting: Emergency Medicine

## 2018-05-19 ENCOUNTER — Inpatient Hospital Stay (HOSPITAL_BASED_OUTPATIENT_CLINIC_OR_DEPARTMENT_OTHER)
Admission: EM | Admit: 2018-05-19 | Discharge: 2018-05-26 | DRG: 054 | Disposition: A | Discharge status: Home / Self Care | Payer: 59 | Attending: Internal Medicine | Admitting: Internal Medicine

## 2018-05-19 DIAGNOSIS — Z955 Presence of coronary angioplasty implant and graft: Secondary | ICD-10-CM

## 2018-05-19 DIAGNOSIS — C189 Malignant neoplasm of colon, unspecified: Secondary | ICD-10-CM | POA: Diagnosis not present

## 2018-05-19 DIAGNOSIS — I252 Old myocardial infarction: Secondary | ICD-10-CM | POA: Diagnosis not present

## 2018-05-19 DIAGNOSIS — R748 Abnormal levels of other serum enzymes: Secondary | ICD-10-CM | POA: Diagnosis not present

## 2018-05-19 DIAGNOSIS — Z794 Long term (current) use of insulin: Secondary | ICD-10-CM | POA: Diagnosis not present

## 2018-05-19 DIAGNOSIS — K8020 Calculus of gallbladder without cholecystitis without obstruction: Secondary | ICD-10-CM

## 2018-05-19 DIAGNOSIS — K861 Other chronic pancreatitis: Secondary | ICD-10-CM | POA: Diagnosis not present

## 2018-05-19 DIAGNOSIS — E785 Hyperlipidemia, unspecified: Secondary | ICD-10-CM | POA: Diagnosis not present

## 2018-05-19 DIAGNOSIS — E1142 Type 2 diabetes mellitus with diabetic polyneuropathy: Secondary | ICD-10-CM | POA: Diagnosis not present

## 2018-05-19 DIAGNOSIS — K851 Biliary acute pancreatitis without necrosis or infection: Secondary | ICD-10-CM | POA: Diagnosis not present

## 2018-05-19 DIAGNOSIS — I10 Essential (primary) hypertension: Secondary | ICD-10-CM | POA: Diagnosis not present

## 2018-05-19 DIAGNOSIS — E860 Dehydration: Secondary | ICD-10-CM

## 2018-05-19 DIAGNOSIS — K449 Diaphragmatic hernia without obstruction or gangrene: Secondary | ICD-10-CM | POA: Diagnosis not present

## 2018-05-19 DIAGNOSIS — E876 Hypokalemia: Secondary | ICD-10-CM | POA: Diagnosis not present

## 2018-05-19 DIAGNOSIS — K859 Acute pancreatitis without necrosis or infection, unspecified: Secondary | ICD-10-CM | POA: Diagnosis not present

## 2018-05-19 DIAGNOSIS — N400 Enlarged prostate without lower urinary tract symptoms: Secondary | ICD-10-CM | POA: Diagnosis present

## 2018-05-19 DIAGNOSIS — I2510 Atherosclerotic heart disease of native coronary artery without angina pectoris: Secondary | ICD-10-CM | POA: Diagnosis present

## 2018-05-19 DIAGNOSIS — Z9049 Acquired absence of other specified parts of digestive tract: Secondary | ICD-10-CM | POA: Diagnosis not present

## 2018-05-19 DIAGNOSIS — K210 Gastro-esophageal reflux disease with esophagitis: Secondary | ICD-10-CM | POA: Diagnosis present

## 2018-05-19 DIAGNOSIS — G43A1 Cyclical vomiting, intractable: Secondary | ICD-10-CM | POA: Diagnosis not present

## 2018-05-19 DIAGNOSIS — C787 Secondary malignant neoplasm of liver and intrahepatic bile duct: Secondary | ICD-10-CM | POA: Diagnosis not present

## 2018-05-19 DIAGNOSIS — R930 Abnormal findings on diagnostic imaging of skull and head, not elsewhere classified: Secondary | ICD-10-CM

## 2018-05-19 DIAGNOSIS — M549 Dorsalgia, unspecified: Secondary | ICD-10-CM | POA: Diagnosis not present

## 2018-05-19 DIAGNOSIS — C182 Malignant neoplasm of ascending colon: Secondary | ICD-10-CM | POA: Diagnosis not present

## 2018-05-19 DIAGNOSIS — Z79899 Other long term (current) drug therapy: Secondary | ICD-10-CM

## 2018-05-19 DIAGNOSIS — G936 Cerebral edema: Secondary | ICD-10-CM | POA: Diagnosis not present

## 2018-05-19 DIAGNOSIS — C7931 Secondary malignant neoplasm of brain: Principal | ICD-10-CM | POA: Diagnosis present

## 2018-05-19 DIAGNOSIS — D860 Sarcoidosis of lung: Secondary | ICD-10-CM | POA: Diagnosis not present

## 2018-05-19 DIAGNOSIS — I159 Secondary hypertension, unspecified: Secondary | ICD-10-CM | POA: Diagnosis not present

## 2018-05-19 DIAGNOSIS — E119 Type 2 diabetes mellitus without complications: Secondary | ICD-10-CM | POA: Diagnosis not present

## 2018-05-19 DIAGNOSIS — Z7982 Long term (current) use of aspirin: Secondary | ICD-10-CM | POA: Diagnosis not present

## 2018-05-19 DIAGNOSIS — R112 Nausea with vomiting, unspecified: Secondary | ICD-10-CM | POA: Diagnosis not present

## 2018-05-19 DIAGNOSIS — I25118 Atherosclerotic heart disease of native coronary artery with other forms of angina pectoris: Secondary | ICD-10-CM | POA: Diagnosis not present

## 2018-05-19 LAB — CBC WITH DIFFERENTIAL/PLATELET
BASOS PCT: 0 %
Basophils Absolute: 0 10*3/uL (ref 0.0–0.1)
EOS ABS: 0.1 10*3/uL (ref 0.0–0.7)
Eosinophils Relative: 1 %
HCT: 46.7 % (ref 39.0–52.0)
HEMOGLOBIN: 16.1 g/dL (ref 13.0–17.0)
Lymphocytes Relative: 13 %
Lymphs Abs: 1.2 10*3/uL (ref 0.7–4.0)
MCH: 29.6 pg (ref 26.0–34.0)
MCHC: 34.5 g/dL (ref 30.0–36.0)
MCV: 85.8 fL (ref 78.0–100.0)
MONOS PCT: 11 %
Monocytes Absolute: 0.9 10*3/uL (ref 0.1–1.0)
NEUTROS PCT: 75 %
Neutro Abs: 6.4 10*3/uL (ref 1.7–7.7)
PLATELETS: 251 10*3/uL (ref 150–400)
RBC: 5.44 MIL/uL (ref 4.22–5.81)
RDW: 12.6 % (ref 11.5–15.5)
WBC: 8.6 10*3/uL (ref 4.0–10.5)

## 2018-05-19 LAB — COMPREHENSIVE METABOLIC PANEL
ALBUMIN: 4.2 g/dL (ref 3.5–5.0)
ALK PHOS: 299 U/L — AB (ref 38–126)
ALT: 17 U/L (ref 17–63)
ANION GAP: 10 (ref 5–15)
AST: 22 U/L (ref 15–41)
BUN: 10 mg/dL (ref 6–20)
CALCIUM: 9 mg/dL (ref 8.9–10.3)
CHLORIDE: 102 mmol/L (ref 101–111)
CO2: 25 mmol/L (ref 22–32)
Creatinine, Ser: 0.88 mg/dL (ref 0.61–1.24)
GFR calc Af Amer: >60 mL/min (ref >60)
GFR calc non Af Amer: >60 mL/min (ref >60)
GLUCOSE: 173 mg/dL — AB (ref 65–99)
POTASSIUM: 4.4 mmol/L (ref 3.5–5.1)
SODIUM: 137 mmol/L (ref 135–145)
Total Bilirubin: 0.7 mg/dL (ref 0.3–1.2)
Total Protein: 8.1 g/dL (ref 6.5–8.1)

## 2018-05-19 LAB — URINALYSIS, ROUTINE W REFLEX MICROSCOPIC
Bilirubin Urine: NEGATIVE
GLUCOSE, UA: NEGATIVE mg/dL
Hgb urine dipstick: NEGATIVE
KETONES UR: 15 mg/dL — AB
LEUKOCYTES UA: NEGATIVE
NITRITE: NEGATIVE
PH: 6 (ref 5.0–8.0)
Protein, ur: NEGATIVE mg/dL

## 2018-05-19 LAB — LIPASE, BLOOD: Lipase: 581 U/L — ABNORMAL HIGH (ref 11–51)

## 2018-05-19 LAB — TROPONIN I: Troponin I: <0.03 ng/mL (ref <0.03)

## 2018-05-19 LAB — CBG MONITORING, ED: Glucose-Capillary: 162 mg/dL — ABNORMAL HIGH (ref 65–99)

## 2018-05-19 MED ORDER — ONDANSETRON HCL 4 MG/2ML IJ SOLN
4.0000 mg | Freq: Once | INTRAMUSCULAR | Status: AC
  Administered 2018-05-19: 4 mg via INTRAVENOUS
  Filled 2018-05-19: qty 2

## 2018-05-19 MED ORDER — SODIUM CHLORIDE 0.9 % IV BOLUS
1000.0000 mL | Freq: Once | INTRAVENOUS | Status: AC
  Administered 2018-05-19: 1000 mL via INTRAVENOUS

## 2018-05-19 MED ORDER — SODIUM CHLORIDE 0.9 % IV BOLUS (SEPSIS)
1000.0000 mL | Freq: Once | INTRAVENOUS | Status: AC
  Administered 2018-05-19: 1000 mL via INTRAVENOUS

## 2018-05-19 MED ORDER — ONDANSETRON HCL 4 MG/2ML IJ SOLN
4.0000 mg | Freq: Four times a day (QID) | INTRAMUSCULAR | Status: DC | PRN
  Administered 2018-05-20 (×2): 4 mg via INTRAVENOUS
  Filled 2018-05-19 (×3): qty 2

## 2018-05-19 MED ORDER — SODIUM CHLORIDE 0.9 % IV SOLN
1000.0000 mL | INTRAVENOUS | Status: DC
  Administered 2018-05-19 – 2018-05-26 (×18): 1000 mL via INTRAVENOUS

## 2018-05-19 NOTE — ED Provider Notes (Signed)
[facility] EMERGENCY DEPARTMENT Provider Note   CSN: [id number] Arrival date & time: 05/19/18  1803     History   Chief Complaint Chief Complaint  Patient presents with  . Emesis  . Weakness    HPI Allen Glenn is a 62 y.o. male.  HPI Patient has metastatic adenocarcinoma of the colon.  Per Dr. [name] note, there is slow progression.  Patient started developing very poor appetite with nausea and intermittent vomiting last week.  He became increasingly weak.  Lab work identified pancreatitis.  Have been trying home management with clear liquids.  Last week there was some improvement and he was approved to try advancing the diet slightly.  His wife reports that they never really could advance the diet and he has had difficulty taking in fluids.  This morning he started vomiting.  He has become increasingly weak.  Denies he is having active pain or fever. Past Medical History:  Diagnosis Date  . Acute MI ([place]) 2014   acute ST elevation MI  . Anemia   . Colon cancer ([place]) 06/13/2016   Status post resection of colon mass is well as he panic metastasis.   . Coronary artery disease   . Enlarged prostate    slightly and takes Flomax daily  . Heart disease   . History of blood transfusion   . Hyperlipidemia    takes Lipitor daily  . Hypertension    takes Lisinopril daily  . Lung nodule    left  . Nocturia   . Numbness    left foot  . Peripheral neuropathy     Patient Active Problem List   Diagnosis Date Noted  . Pulmonary metastasis ([place]) 12/20/2016  . Pulmonary nodule 11/28/2016  . Colon cancer ([place]) 06/13/2016  . Hyperlipidemia 06/13/2016  . Chronic diastolic heart failure ([place]) 06/13/2016  . Symptomatic anemia 04/09/2015  . Iron deficiency anemia 04/09/2015  . CAD s/p PCI LAD 06/2013 [facility] 04/09/2015  . Nausea & vomiting 04/09/2015  . Snores--However on Lyrica/Ambien 04/09/2015  . Lumbar radiculopathy 03/07/2015  . Hereditary and idiopathic  peripheral neuropathy 03/07/2015    Past Surgical History:  Procedure Laterality Date  . 1/8 of liver removed    . APPENDECTOMY    . COLONOSCOPY    . CORONARY ANGIOPLASTY     1 stent  . CORONARY STENT PLACEMENT  06-27-2013  . HERNIA REPAIR Left 1991  . LUNG BIOPSY Left 11/28/2016   Procedure: LUNG BIOPSY, left upper lobe;  Surgeon: [name], MD;  Location: [facility];  Service: Thoracic;  Laterality: Left;  . partial coloectomy    . VIDEO BRONCHOSCOPY WITH ENDOBRONCHIAL NAVIGATION N/A 11/28/2016   Procedure: VIDEO BRONCHOSCOPY WITH ENDOBRONCHIAL NAVIGATION;  Surgeon: [name], MD;  Location: [facility];  Service: Thoracic;  Laterality: N/A;  . VIDEO BRONCHOSCOPY WITH ENDOBRONCHIAL ULTRASOUND N/A 11/28/2016   Procedure: VIDEO BRONCHOSCOPY WITH ENDOBRONCHIAL ULTRASOUND;  Surgeon: [name], MD;  Location: [facility];  Service: Thoracic;  Laterality: N/A;        Home Medications    Prior to Admission medications   Medication Sig Start Date End Date Taking? Authorizing Provider  ondansetron (ZOFRAN-ODT) 4 MG disintegrating tablet Take 4 mg by mouth every 8 (eight) hours as needed for nausea or vomiting.   Yes [provider]  aspirin EC 81 MG tablet Take 81 mg by mouth daily. 06/30/13   [provider]  atorvastatin (LIPITOR) 10 MG tablet Take 10 mg by mouth daily  at 6 PM.     [provider]  Calcium-Magnesium-Vitamin D (CALCIUM MAGNESIUM PO) Take 1 tablet by mouth daily.    [provider]  lisinopril (PRINIVIL,ZESTRIL) 2.5 MG tablet Take 2.5 mg by mouth daily. 02/16/15   [provider]  Multiple Vitamin (MULTIVITAMIN) capsule Take 1 capsule by mouth daily.     [provider]  nitroGLYCERIN (NITROSTAT) 0.4 MG SL tablet Place 0.4 mg under the tongue every 5 (five) minutes as needed for chest pain. CALL 911 IF PAIN PERSISTS 11/24/14   [provider]  omega-3 acid ethyl esters (LOVAZA) 1 g capsule Take 1 capsule (1 g  total) by mouth daily. 09/06/17   [name], MD  tamsulosin (FLOMAX) 0.4 MG CAPS capsule Take 0.4 mg by mouth daily. 02/24/15   [provider]  vitamin B-12 (CYANOCOBALAMIN) 1000 MCG tablet Take 1,000 mcg by mouth daily.     [provider]    Family History Family History  Problem Relation Age of Onset  . Hypertension Mother   . Heart disease Mother   . Hypertension Father   . Heart disease Father   . Neuropathy Neg Hx     Social History Social History   Tobacco Use  . Smoking status: Never Smoker  . Smokeless tobacco: Never Used  Substance Use Topics  . Alcohol use: No    Alcohol/week: 0.0 oz  . Drug use: No     Allergies   No known allergies   Review of Systems Review of Systems 10 Systems reviewed and are negative for acute change except as noted in the HPI.   Physical Exam Updated Vital Signs BP (!) 152/76   Pulse 72   Temp 98.2 F (36.8 C) (Oral)   Resp 16   Ht 6\' 2"  (1.88 m)   Wt 82.1 kg (181 lb)   SpO2 97%   BMI 23.24 kg/m   Physical Exam  Constitutional: He is oriented to person, place, and time.  Patient has fatigue but nontoxic appearance.  He is alert.  No respiratory distress.  HENT:  Head: Normocephalic and atraumatic.  Mouth/Throat: Oropharynx is clear and moist.  Eyes: EOM are normal.  Cardiovascular: Normal rate, regular rhythm, normal heart sounds and intact distal pulses.  Pulmonary/Chest: Effort normal and breath sounds normal.  Abdominal: Soft. He exhibits no distension. There is no tenderness.  Musculoskeletal: Normal range of motion. He exhibits no edema or tenderness.  Neurological: He is alert and oriented to person, place, and time. He exhibits normal muscle tone. Coordination normal.  Skin: Skin is warm and dry. There is pallor.  Psychiatric: He has a normal mood and affect.     ED Treatments / Results  Labs (all labs ordered are listed, but only abnormal results are displayed) Labs Reviewed    COMPREHENSIVE METABOLIC PANEL - Abnormal; Notable for the following components:      Result Value   Glucose, Bld 173 (*)    Alkaline Phosphatase 299 (*)    All other components within normal limits  LIPASE, BLOOD - Abnormal; Notable for the following components:   Lipase 581 (*)    All other components within normal limits  URINALYSIS, ROUTINE W REFLEX MICROSCOPIC - Abnormal; Notable for the following components:   Specific Gravity, Urine >1.030 (*)    Ketones, ur 15 (*)    All other components within normal limits  CBG MONITORING, ED - Abnormal; Notable for the following components:   Glucose-Capillary 162 (*)  All other components within normal limits  CBC WITH DIFFERENTIAL/PLATELET  TROPONIN I    EKG None  Radiology No results found.  Procedures Procedures (including critical care time)  Medications Ordered in ED Medications  sodium chloride 0.9 % bolus 1,000 mL (1,000 mLs Intravenous New Bag/Given 05/19/18 1939)    Followed by  0.9 %  sodium chloride infusion (has no administration in time range)  sodium chloride 0.9 % bolus 1,000 mL (0 mLs Intravenous Stopped 05/19/18 1903)  ondansetron (ZOFRAN) injection 4 mg (4 mg Intravenous Given 05/19/18 1941)     Initial Impression / Assessment and Plan / ED Course  I have reviewed the triage vital signs and the nursing notes.  Pertinent labs & imaging results that were available during my care of the patient were reviewed by me and considered in my medical decision making (see chart for details).     Final Clinical Impressions(s) / ED Diagnoses   Final diagnoses:  Dehydration  Other chronic pancreatitis ([place])  Metastatic colon cancer to liver [facility])   Findings consistent with dehydration.  Patient is weak and has had poor oral intake.  He did develop vomiting this morning.  Patient does have elevated specific gravity and some ketones in the urine.  Suspected hemoconcentration on CBC with hemoglobin at 16.  Patient's  lipase elevation has been waxing and waning.  It has increased again.  He did have a CT scan midweek.  Some minor progression of underlying disease without other acutely identified etiologies.  Question whether there is any tumor or mass that may be associated with the pancreas or hepatobiliary ducts that is not yet visualized on CT causing patient with smoldering pancreatitis.  Plan for admission for continued hydration with history of metastatic adeno colon cancer and more recent pancreatitis. ED Discharge Orders    None       Charlesetta Shanks, MD 05/19/18 1958

## 2018-05-19 NOTE — ED Triage Notes (Signed)
Pt has poor appetite, N/V for several weeks.  Pt has been worsening of symptoms last few days.  Very weak, unable to eat much.  Pt diabetic.  Recently diagnosed with pancreatitis.

## 2018-05-19 NOTE — ED Notes (Signed)
Spouse provided with patient bed assignment at [facility] [phone])

## 2018-05-20 ENCOUNTER — Encounter (HOSPITAL_COMMUNITY): Payer: Self-pay | Admitting: Internal Medicine

## 2018-05-20 ENCOUNTER — Observation Stay (HOSPITAL_COMMUNITY): Payer: 59

## 2018-05-20 ENCOUNTER — Telehealth: Payer: Self-pay | Admitting: *Deleted

## 2018-05-20 DIAGNOSIS — I10 Essential (primary) hypertension: Secondary | ICD-10-CM

## 2018-05-20 DIAGNOSIS — C182 Malignant neoplasm of ascending colon: Secondary | ICD-10-CM

## 2018-05-20 DIAGNOSIS — C189 Malignant neoplasm of colon, unspecified: Secondary | ICD-10-CM

## 2018-05-20 DIAGNOSIS — E119 Type 2 diabetes mellitus without complications: Secondary | ICD-10-CM

## 2018-05-20 DIAGNOSIS — K861 Other chronic pancreatitis: Secondary | ICD-10-CM

## 2018-05-20 DIAGNOSIS — K859 Acute pancreatitis without necrosis or infection, unspecified: Secondary | ICD-10-CM

## 2018-05-20 DIAGNOSIS — C787 Secondary malignant neoplasm of liver and intrahepatic bile duct: Secondary | ICD-10-CM

## 2018-05-20 DIAGNOSIS — R112 Nausea with vomiting, unspecified: Secondary | ICD-10-CM | POA: Diagnosis not present

## 2018-05-20 LAB — HEPATIC FUNCTION PANEL
ALBUMIN: 3.4 g/dL — AB (ref 3.5–5.0)
ALT: 14 U/L — ABNORMAL LOW (ref 17–63)
AST: 15 U/L (ref 15–41)
Alkaline Phosphatase: 224 U/L — ABNORMAL HIGH (ref 38–126)
BILIRUBIN TOTAL: 0.9 mg/dL (ref 0.3–1.2)
Bilirubin, Direct: 0.1 mg/dL (ref 0.1–0.5)
Indirect Bilirubin: 0.8 mg/dL (ref 0.3–0.9)
TOTAL PROTEIN: 6.6 g/dL (ref 6.5–8.1)

## 2018-05-20 LAB — BASIC METABOLIC PANEL
Anion gap: 11 (ref 5–15)
BUN: 8 mg/dL (ref 6–20)
CALCIUM: 8.4 mg/dL — AB (ref 8.9–10.3)
CO2: 22 mmol/L (ref 22–32)
Chloride: 108 mmol/L (ref 101–111)
Creatinine, Ser: 0.7 mg/dL (ref 0.61–1.24)
GFR calc Af Amer: >60 mL/min (ref >60)
GLUCOSE: 143 mg/dL — AB (ref 65–99)
Potassium: 3.7 mmol/L (ref 3.5–5.1)
Sodium: 141 mmol/L (ref 135–145)

## 2018-05-20 LAB — GLUCOSE, CAPILLARY
GLUCOSE-CAPILLARY: 102 mg/dL — AB (ref 65–99)
GLUCOSE-CAPILLARY: 121 mg/dL — AB (ref 65–99)
GLUCOSE-CAPILLARY: 138 mg/dL — AB (ref 65–99)
GLUCOSE-CAPILLARY: 152 mg/dL — AB (ref 65–99)
Glucose-Capillary: 138 mg/dL — ABNORMAL HIGH (ref 65–99)
Glucose-Capillary: 142 mg/dL — ABNORMAL HIGH (ref 65–99)

## 2018-05-20 LAB — CBC
HCT: 41.5 % (ref 39.0–52.0)
Hemoglobin: 13.5 g/dL (ref 13.0–17.0)
MCH: 28.7 pg (ref 26.0–34.0)
MCHC: 32.5 g/dL (ref 30.0–36.0)
MCV: 88.1 fL (ref 78.0–100.0)
Platelets: 211 10*3/uL (ref 150–400)
RBC: 4.71 MIL/uL (ref 4.22–5.81)
RDW: 12.5 % (ref 11.5–15.5)
WBC: 6.1 10*3/uL (ref 4.0–10.5)

## 2018-05-20 LAB — HIV ANTIBODY (ROUTINE TESTING W REFLEX): HIV SCREEN 4TH GENERATION: NONREACTIVE

## 2018-05-20 LAB — LIPASE, BLOOD: LIPASE: 179 U/L — AB (ref 11–51)

## 2018-05-20 MED ORDER — INSULIN GLARGINE 100 UNIT/ML ~~LOC~~ SOLN
10.0000 [IU] | Freq: Every day | SUBCUTANEOUS | Status: DC
  Administered 2018-05-20 – 2018-05-24 (×6): 10 [IU] via SUBCUTANEOUS
  Filled 2018-05-20 (×6): qty 0.1

## 2018-05-20 MED ORDER — LISINOPRIL 5 MG PO TABS
2.5000 mg | ORAL_TABLET | Freq: Every day | ORAL | Status: DC
  Administered 2018-05-20 – 2018-05-26 (×4): 2.5 mg via ORAL
  Filled 2018-05-20 (×7): qty 1

## 2018-05-20 MED ORDER — IOPAMIDOL (ISOVUE-300) INJECTION 61%
100.0000 mL | Freq: Once | INTRAVENOUS | Status: AC | PRN
  Administered 2018-05-20: 100 mL via INTRAVENOUS

## 2018-05-20 MED ORDER — ONDANSETRON HCL 4 MG PO TABS: 4.0000 mg | ORAL_TABLET | Freq: Four times a day (QID) | ORAL | Status: DC | PRN

## 2018-05-20 MED ORDER — NITROGLYCERIN 0.4 MG SL SUBL: 0.4000 mg | SUBLINGUAL_TABLET | SUBLINGUAL | Status: DC | PRN

## 2018-05-20 MED ORDER — ENOXAPARIN SODIUM 40 MG/0.4ML ~~LOC~~ SOLN
40.0000 mg | Freq: Every day | SUBCUTANEOUS | Status: DC
  Administered 2018-05-20 – 2018-05-26 (×6): 40 mg via SUBCUTANEOUS
  Filled 2018-05-20 (×6): qty 0.4

## 2018-05-20 MED ORDER — ACETAMINOPHEN 650 MG RE SUPP: 650.0000 mg | Freq: Four times a day (QID) | RECTAL | Status: DC | PRN

## 2018-05-20 MED ORDER — TAMSULOSIN HCL 0.4 MG PO CAPS
0.4000 mg | ORAL_CAPSULE | Freq: Every day | ORAL | Status: DC
  Administered 2018-05-20 – 2018-05-26 (×4): 0.4 mg via ORAL
  Filled 2018-05-20 (×7): qty 1

## 2018-05-20 MED ORDER — INSULIN ASPART 100 UNIT/ML ~~LOC~~ SOLN
0.0000 [IU] | Freq: Three times a day (TID) | SUBCUTANEOUS | Status: DC
  Administered 2018-05-20 – 2018-05-22 (×4): 1 [IU] via SUBCUTANEOUS
  Administered 2018-05-23: 2 [IU] via SUBCUTANEOUS
  Administered 2018-05-24: 7 [IU] via SUBCUTANEOUS
  Administered 2018-05-24: 3 [IU] via SUBCUTANEOUS
  Administered 2018-05-24: 9 [IU] via SUBCUTANEOUS
  Administered 2018-05-25: 2 [IU] via SUBCUTANEOUS

## 2018-05-20 MED ORDER — PROMETHAZINE HCL 25 MG/ML IJ SOLN
12.5000 mg | Freq: Four times a day (QID) | INTRAMUSCULAR | Status: DC | PRN
  Administered 2018-05-20 – 2018-05-21 (×3): 12.5 mg via INTRAVENOUS
  Filled 2018-05-20 (×3): qty 1

## 2018-05-20 MED ORDER — ONDANSETRON HCL 4 MG/2ML IJ SOLN: 4.0000 mg | Freq: Four times a day (QID) | INTRAMUSCULAR | Status: DC | PRN

## 2018-05-20 MED ORDER — IOPAMIDOL (ISOVUE-300) INJECTION 61%
INTRAVENOUS | Status: AC
  Filled 2018-05-20: qty 100

## 2018-05-20 MED ORDER — ACETAMINOPHEN 325 MG PO TABS
650.0000 mg | ORAL_TABLET | Freq: Four times a day (QID) | ORAL | Status: DC | PRN
  Administered 2018-05-24 – 2018-05-26 (×4): 650 mg via ORAL
  Filled 2018-05-20 (×4): qty 2

## 2018-05-20 MED ORDER — ASPIRIN EC 81 MG PO TBEC
81.0000 mg | DELAYED_RELEASE_TABLET | Freq: Every day | ORAL | Status: DC
  Administered 2018-05-20 – 2018-05-26 (×4): 81 mg via ORAL
  Filled 2018-05-20 (×6): qty 1

## 2018-05-20 NOTE — Progress Notes (Addendum)
The patient was admitted early this morning after midnight and H&P has been reviewed.  I am in agreement with current assessment and plan done by Dr. Gean Birchwood.  Additional changes the plan of care been made accordingly.  Patient is a 62 year old Caucasian male with a history of metastatic colon cancer with slow progression followed by [name] as an outpatient, history of CAD status post stenting and history of ST elevation MI, history of hypertension, hyperlipidemia, history of lung nodules, history of peripheral neuropathy, BPH, history of anemia and other comorbidities who presented to [facility] emergency room with chief complaint of intractable nausea and vomiting.  Last week patient had a CT scan of the Abdomen/Pelvis and had elevated lipase and was diagnosed with pancreatitis.  CT scan of the abdomen done did not reveal any pancreatitis on imaging however did show progression of his metastasis.  Since his CT scan on Thursday patient has progressive nausea, vomiting and weakness morning he had some episodes of diarrhea.  Patient presented to the emergency room in [facility] and was transferred to [facility] for dehydration and elevated lipase level as well as intractable nausea and vomiting.  Case was discussed with Dr. Collene Mares of gastroenterology who states that Dr. Benson Norway will see the patient in consultation and will continue full liquid diet.  I also discussed the case with patient's primary oncologist Dr. Julieanne Manson will see the patient in consultation today as well.  We will continue antiemetics for the patient and continue to monitor his clinical response to intervention.  We will repeat blood work in a.m.Marland Kitchen

## 2018-05-20 NOTE — Telephone Encounter (Signed)
Voicemail received "Allen Glenn calling to notify of Allen Glenn's admission to room 1602 at [facility].  He wanted me to call today for IVF and GI referral but got to sick."   Message forwarded to collaborative for further communication with family as needed.

## 2018-05-20 NOTE — H&P (Signed)
History and Physical    Isrrael Glenn STM:[id number] DOB: [birth year] DOA: 05/19/2018  PCP: [name], MD  Patient coming from: Home.  Chief Complaint: Nausea vomiting.  HPI: Allen Glenn is a 62 y.o. male with history of metastatic colon cancer with slow progression being followed by Dr. [name], oncologist, CAD status post stenting presents to the ER admits in [facility] with complaints of persistent nausea vomiting.  Patient was diagnosed with pancreatitis with elevated lipase by patient's primary care physician last week.  Also had a subsequent CAT scan done which showed progression of his metastasis.  Since then patient has been having progressive nausea vomiting and weakness.  Presents to the ER admits in [facility].  Denies any chest pain or shortness of breath fever or chills.  ED Course: In the ER patient lipase was found to be around 500.  Patient appeared dehydrated and was started on fluids.  Given the persistent nausea vomiting patient is admitted for further observation.  Denies any abdominal pain at this time.  Review of Systems: As per HPI, rest all negative.   Past Medical History:  Diagnosis Date  . Acute MI ([place]) 2014   acute ST elevation MI  . Anemia   . Colon cancer ([place]) 06/13/2016   Status post resection of colon mass is well as he panic metastasis.   . Coronary artery disease   . Enlarged prostate    slightly and takes Flomax daily  . Heart disease   . History of blood transfusion   . Hyperlipidemia    takes Lipitor daily  . Hypertension    takes Lisinopril daily  . Lung nodule    left  . Nocturia   . Numbness    left foot  . Peripheral neuropathy     Past Surgical History:  Procedure Laterality Date  . 1/8 of liver removed    . APPENDECTOMY    . COLONOSCOPY    . CORONARY ANGIOPLASTY     1 stent  . CORONARY STENT PLACEMENT  06-27-2013  . HERNIA REPAIR Left 1991  . LUNG BIOPSY Left 11/28/2016   Procedure: LUNG BIOPSY, left upper lobe;  Surgeon:  [name], MD;  Location: [place];  Service: Thoracic;  Laterality: Left;  . partial coloectomy    . VIDEO BRONCHOSCOPY WITH ENDOBRONCHIAL NAVIGATION N/A 11/28/2016   Procedure: VIDEO BRONCHOSCOPY WITH ENDOBRONCHIAL NAVIGATION;  Surgeon: [name], MD;  Location: [place];  Service: Thoracic;  Laterality: N/A;  . VIDEO BRONCHOSCOPY WITH ENDOBRONCHIAL ULTRASOUND N/A 11/28/2016   Procedure: VIDEO BRONCHOSCOPY WITH ENDOBRONCHIAL ULTRASOUND;  Surgeon: [name], MD;  Location: [place];  Service: Thoracic;  Laterality: N/A;     reports that he has never smoked. He has never used smokeless tobacco. He reports that he does not drink alcohol or use drugs.  Allergies  Allergen Reactions  . No Known Allergies     Family History  Problem Relation Age of Onset  . Hypertension Mother   . Heart disease Mother   . Hypertension Father   . Heart disease Father   . Neuropathy Neg Hx     Prior to Admission medications   Medication Sig Start Date End Date Taking? Authorizing Provider  aspirin EC 81 MG tablet Take 81 mg by mouth daily. 06/30/13  Yes [provider]  atorvastatin (LIPITOR) 10 MG tablet Take 10 mg by mouth daily at 6 PM.    Yes [provider]  Coenzyme Q10 (CO Q 10  PO) Take 1 capsule by mouth daily.   Yes [provider]  Insulin Glargine (BASAGLAR KWIKPEN) 100 UNIT/ML SOPN Inject 10 Units into the skin at bedtime.   Yes [provider]  lisinopril (PRINIVIL,ZESTRIL) 2.5 MG tablet Take 2.5 mg by mouth daily. 02/16/15  Yes [provider]  nitroGLYCERIN (NITROSTAT) 0.4 MG SL tablet Place 0.4 mg under the tongue every 5 (five) minutes as needed for chest pain. CALL 911 IF PAIN PERSISTS 11/24/14  Yes [provider]  ondansetron (ZOFRAN-ODT) 4 MG disintegrating tablet Take 4 mg by mouth every 8 (eight) hours as needed for nausea or vomiting.   Yes [provider]  tamsulosin (FLOMAX) 0.4 MG CAPS capsule Take 0.4 mg by  mouth daily. 02/24/15  Yes [provider]  omega-3 acid ethyl esters (LOVAZA) 1 g capsule Take 1 capsule (1 g total) by mouth daily. Patient not taking: Reported on 05/19/2018 09/06/17   [name], MD    Physical Exam: Vitals:   05/19/18 2125 05/19/18 2130 05/19/18 2145 05/19/18 2250  BP: (!) 142/79 (!) 151/81 (!) 145/80 (!) 154/82  Pulse: 80 80 80 72  Resp: 18   20  Temp: 98.2 F (36.8 C)   98.6 F (37 C)  TempSrc:    Oral  SpO2: 100% 99% 99% 99%  Weight:    83.5 kg (184 lb 1.4 oz)  Height:    6\' 2"  (1.88 m)      Constitutional: Moderately built and nourished. Vitals:   05/19/18 2125 05/19/18 2130 05/19/18 2145 05/19/18 2250  BP: (!) 142/79 (!) 151/81 (!) 145/80 (!) 154/82  Pulse: 80 80 80 72  Resp: 18   20  Temp: 98.2 F (36.8 C)   98.6 F (37 C)  TempSrc:    Oral  SpO2: 100% 99% 99% 99%  Weight:    83.5 kg (184 lb 1.4 oz)  Height:    6\' 2"  (1.88 m)   Eyes: Anicteric no pallor. ENMT: No discharge from the ears eyes nose or mouth. Neck: No mass felt.  No neck rigidity but no JVD appreciated. Respiratory: No rhonchi or crepitations. Cardiovascular: S1-S2 heard no murmurs appreciated. Abdomen: Soft nontender bowel sounds present.  No guarding or rigidity. Musculoskeletal: No edema.  No joint effusion. Skin: No rash.  Skin appears warm. Neurologic: Alert awake oriented to time place and person.  Moves all extremities. Psychiatric: Appears normal.  Normal affect.   Labs on Admission: I have personally reviewed following labs and imaging studies  CBC: Recent Labs  Lab 05/19/18 1821  WBC 8.6  NEUTROABS 6.4  HGB 16.1  HCT 46.7  MCV 85.8  PLT 086   Basic Metabolic Panel: Recent Labs  Lab 05/19/18 1821  NA 137  K 4.4  CL 102  CO2 25  GLUCOSE 173*  BUN 10  CREATININE 0.88  CALCIUM 9.0   GFR: Estimated Creatinine Clearance: 102.5 mL/min (by C-G formula based on SCr of 0.88 mg/dL). Liver Function Tests: Recent Labs  Lab 05/19/18 1821    AST 22  ALT 17  ALKPHOS 299*  BILITOT 0.7  PROT 8.1  ALBUMIN 4.2   Recent Labs  Lab 05/19/18 1821  LIPASE 581*   No results for input(s): AMMONIA in the last 168 hours. Coagulation Profile: No results for input(s): INR, PROTIME in the last 168 hours. Cardiac Enzymes: Recent Labs  Lab 05/19/18 1821  TROPONINI <0.03   BNP (last 3 results) No results for input(s): PROBNP in the last 8760  hours. HbA1C: No results for input(s): HGBA1C in the last 72 hours. CBG: Recent Labs  Lab 05/19/18 1812  GLUCAP 162*   Lipid Profile: No results for input(s): CHOL, HDL, LDLCALC, TRIG, CHOLHDL, LDLDIRECT in the last 72 hours. Thyroid Function Tests: No results for input(s): TSH, T4TOTAL, FREET4, T3FREE, THYROIDAB in the last 72 hours. Anemia Panel: No results for input(s): VITAMINB12, FOLATE, FERRITIN, TIBC, IRON, RETICCTPCT in the last 72 hours. Urine analysis:    Component Value Date/Time   COLORURINE YELLOW 05/19/2018 1825   APPEARANCEUR CLEAR 05/19/2018 1825   LABSPEC >1.030 (H) 05/19/2018 1825   PHURINE 6.0 05/19/2018 1825   GLUCOSEU NEGATIVE 05/19/2018 1825   HGBUR NEGATIVE 05/19/2018 1825   BILIRUBINUR NEGATIVE 05/19/2018 1825   KETONESUR 15 (A) 05/19/2018 1825   PROTEINUR NEGATIVE 05/19/2018 1825   NITRITE NEGATIVE 05/19/2018 1825   LEUKOCYTESUR NEGATIVE 05/19/2018 1825   Sepsis Labs: @LABRCNTIP (procalcitonin:4,lacticidven:4) )No results found for this or any previous visit (from the past 240 hour(s)).   Radiological Exams on Admission: No results found.    Assessment/Plan Principal Problem:   Nausea & vomiting Active Problems:   CAD s/p PCI LAD 06/2013 [facility]   Other chronic pancreatitis ([facility])   Metastatic colon cancer to liver [facility])   Essential hypertension    1. Nausea vomiting with possible pancreatitis -we will keep patient on full liquid diet and gently hydrate.  Consult gastroenterologist in a.m. for further input. 2. Diabetes mellitus type 2  was recently placed on Lantus 10 units.  We will continue this for now and closely follow CBGs. 3. CAD status post stenting and hypertension on aspirin lisinopril.  Has not taken statins for last few days due to weakness. 4. Metastatic colon cancer being followed by Dr. [name], oncologist.   DVT prophylaxis: Lovenox. Code Status: Full code. Family Communication: Patient's wife. Disposition Plan: Home. Consults called: None. Admission status: Observation.   Rise Patience MD [facility] Pager [phone].  If 7PM-7AM, please contact night-coverage [email or web address] Password [email or web address]  05/20/2018, 1:20 AM

## 2018-05-20 NOTE — Consult Note (Signed)
Reason for Consult: Nausea, vomiting, pancreatitis, and metastatic colon cancer Referring Physician: [facility]  Julez Ferryman HPI: This is a 62 year old male with a PMH of Stage IV colon cancer that is slowly progressive.  He was initially noted to have an HGB of 6.9 g/dfL and MCV of 54 by his cardiologist at [facility].  At that time he was complaining of chest pain and SOB and he is s/p MI with stent placement on 06/2013.  The patient was taking Plavix and ASA at that time.  He was referred to Dr. Jacelyn Grip at [facility] and an EGD/Colonoscopy performed on 04/26/2015 revealed a 5 cm moderately differentiated adenocarcinoma in the ascending colon.  A right hemicolectomy was performed on 05/04/2015 with 7/22 positive lymph nodes, clear margins, and ymphovascular invasion.  Preoperative scans did revealed a 2.9 cm isolated left hepatic lobe lesion confirmed to be a met.  He underwent preoperative chemotherapy followed by a left hepatectomy.  Since that time he has had slow progressive disease and he is treated by [name].  The last CT scan was on 05/15/2018 confirming the progression of his cancer.  The patient recently presented to the ER and he was found to have an elevated lipase at 581 as well as an elevated AP at 299.  The patient was symptomatic with nausea and vomiting.  The AP on 02/24/2018 was at 121.  His wife reports that he was not feeling well on 05/07/2018.  Prior to this time he denied any symptoms with nausea and vomiting.  Over the interval time period his symptoms continued to worsen.  He was evaluated by Dr. Forde Dandy last week and his lipase was noted to be elevated in the 400 range.  Additionally, he was recently diagnosed with DM, approximately 1 month ago, and he was started on metformin.  Past Medical History:  Diagnosis Date  . Acute MI ([place]) 2014   acute ST elevation MI  . Anemia   . Colon cancer ([place]) 06/13/2016   Status post resection of colon mass is well  as he panic metastasis.   . Coronary artery disease   . Enlarged prostate    slightly and takes Flomax daily  . Heart disease   . History of blood transfusion   . Hyperlipidemia    takes Lipitor daily  . Hypertension    takes Lisinopril daily  . Lung nodule    left  . Nocturia   . Numbness    left foot  . Peripheral neuropathy     Past Surgical History:  Procedure Laterality Date  . 1/8 of liver removed    . APPENDECTOMY    . COLONOSCOPY    . CORONARY ANGIOPLASTY     1 stent  . CORONARY STENT PLACEMENT  06-27-2013  . HERNIA REPAIR Left 1991  . LUNG BIOPSY Left 11/28/2016   Procedure: LUNG BIOPSY, left upper lobe;  Surgeon: [name], MD;  Location: [facility];  Service: Thoracic;  Laterality: Left;  . partial coloectomy    . VIDEO BRONCHOSCOPY WITH ENDOBRONCHIAL NAVIGATION N/A 11/28/2016   Procedure: VIDEO BRONCHOSCOPY WITH ENDOBRONCHIAL NAVIGATION;  Surgeon: [name], MD;  Location: [facility] OR;  Service: Thoracic;  Laterality: N/A;  . VIDEO BRONCHOSCOPY WITH ENDOBRONCHIAL ULTRASOUND N/A 11/28/2016   Procedure: VIDEO BRONCHOSCOPY WITH ENDOBRONCHIAL ULTRASOUND;  Surgeon: [name], MD;  Location: [facility] OR;  Service: Thoracic;  Laterality: N/A;    Family History  Problem Relation Age of Onset  .  Hypertension Mother   . Heart disease Mother   . Hypertension Father   . Heart disease Father   . Neuropathy Neg Hx     Social History:  reports that he has never smoked. He has never used smokeless tobacco. He reports that he does not drink alcohol or use drugs.  Allergies:  Allergies  Allergen Reactions  . No Known Allergies     Medications:  Scheduled: . aspirin EC  81 mg Oral Daily  . enoxaparin (LOVENOX) injection  40 mg Subcutaneous Daily  . insulin aspart  0-9 Units Subcutaneous TID WC  . insulin glargine  10 Units Subcutaneous QHS  . lisinopril  2.5 mg Oral Daily  . tamsulosin  0.4 mg Oral Daily   Continuous: . sodium chloride 1,000 mL (05/20/18  1138)    Results for orders placed or performed during the hospital encounter of 05/19/18 (from the past 24 hour(s))  CBG monitoring, ED     Status: Abnormal   Collection Time: 05/19/18  6:12 PM  Result Value Ref Range   Glucose-Capillary 162 (H) 65 - 99 mg/dL  CBC with Differential     Status: None   Collection Time: 05/19/18  6:21 PM  Result Value Ref Range   WBC 8.6 4.0 - 10.5 K/uL   RBC 5.44 4.22 - 5.81 MIL/uL   Hemoglobin 16.1 13.0 - 17.0 g/dL   HCT 46.7 39.0 - 52.0 %   MCV 85.8 78.0 - 100.0 fL   MCH 29.6 26.0 - 34.0 pg   MCHC 34.5 30.0 - 36.0 g/dL   RDW 12.6 11.5 - 15.5 %   Platelets 251 150 - 400 K/uL   Neutrophils Relative % 75 %   Neutro Abs 6.4 1.7 - 7.7 K/uL   Lymphocytes Relative 13 %   Lymphs Abs 1.2 0.7 - 4.0 K/uL   Monocytes Relative 11 %   Monocytes Absolute 0.9 0.1 - 1.0 K/uL   Eosinophils Relative 1 %   Eosinophils Absolute 0.1 0.0 - 0.7 K/uL   Basophils Relative 0 %   Basophils Absolute 0.0 0.0 - 0.1 K/uL  Comprehensive metabolic panel     Status: Abnormal   Collection Time: 05/19/18  6:21 PM  Result Value Ref Range   Sodium 137 135 - 145 mmol/L   Potassium 4.4 3.5 - 5.1 mmol/L   Chloride 102 101 - 111 mmol/L   CO2 25 22 - 32 mmol/L   Glucose, Bld 173 (H) 65 - 99 mg/dL   BUN 10 6 - 20 mg/dL   Creatinine, Ser 0.88 0.61 - 1.24 mg/dL   Calcium 9.0 8.9 - 10.3 mg/dL   Total Protein 8.1 6.5 - 8.1 g/dL   Albumin 4.2 3.5 - 5.0 g/dL   AST 22 15 - 41 U/L   ALT 17 17 - 63 U/L   Alkaline Phosphatase 299 (H) 38 - 126 U/L   Total Bilirubin 0.7 0.3 - 1.2 mg/dL   GFR calc non Af Amer >60 >60 mL/min   GFR calc Af Amer >60 >60 mL/min   Anion gap 10 5 - 15  Troponin I     Status: None   Collection Time: 05/19/18  6:21 PM  Result Value Ref Range   Troponin I <0.03 <0.03 ng/mL  Lipase, blood     Status: Abnormal   Collection Time: 05/19/18  6:21 PM  Result Value Ref Range   Lipase 581 (H) 11 - 51 U/L  Urinalysis, Routine w reflex microscopic  Status:  Abnormal   Collection Time: 05/19/18  6:25 PM  Result Value Ref Range   Color, Urine YELLOW YELLOW   APPearance CLEAR CLEAR   Specific Gravity, Urine >1.030 (H) 1.005 - 1.030   pH 6.0 5.0 - 8.0   Glucose, UA NEGATIVE NEGATIVE mg/dL   Hgb urine dipstick NEGATIVE NEGATIVE   Bilirubin Urine NEGATIVE NEGATIVE   Ketones, ur 15 (A) NEGATIVE mg/dL   Protein, ur NEGATIVE NEGATIVE mg/dL   Nitrite NEGATIVE NEGATIVE   Leukocytes, UA NEGATIVE NEGATIVE  Glucose, capillary     Status: Abnormal   Collection Time: 05/20/18 12:38 AM  Result Value Ref Range   Glucose-Capillary 152 (H) 65 - 99 mg/dL   Comment 1 Notify RN    Comment 2 Document in Chart   Glucose, capillary     Status: Abnormal   Collection Time: 05/20/18  2:43 AM  Result Value Ref Range   Glucose-Capillary 138 (H) 65 - 99 mg/dL   Comment 1 Notify RN    Comment 2 Document in Chart   Basic metabolic panel     Status: Abnormal   Collection Time: 05/20/18  4:18 AM  Result Value Ref Range   Sodium 141 135 - 145 mmol/L   Potassium 3.7 3.5 - 5.1 mmol/L   Chloride 108 101 - 111 mmol/L   CO2 22 22 - 32 mmol/L   Glucose, Bld 143 (H) 65 - 99 mg/dL   BUN 8 6 - 20 mg/dL   Creatinine, Ser 0.70 0.61 - 1.24 mg/dL   Calcium 8.4 (L) 8.9 - 10.3 mg/dL   GFR calc non Af Amer >60 >60 mL/min   GFR calc Af Amer >60 >60 mL/min   Anion gap 11 5 - 15  CBC     Status: None   Collection Time: 05/20/18  4:18 AM  Result Value Ref Range   WBC 6.1 4.0 - 10.5 K/uL   RBC 4.71 4.22 - 5.81 MIL/uL   Hemoglobin 13.5 13.0 - 17.0 g/dL   HCT 41.5 39.0 - 52.0 %   MCV 88.1 78.0 - 100.0 fL   MCH 28.7 26.0 - 34.0 pg   MCHC 32.5 30.0 - 36.0 g/dL   RDW 12.5 11.5 - 15.5 %   Platelets 211 150 - 400 K/uL  Hepatic function panel     Status: Abnormal   Collection Time: 05/20/18  4:18 AM  Result Value Ref Range   Total Protein 6.6 6.5 - 8.1 g/dL   Albumin 3.4 (L) 3.5 - 5.0 g/dL   AST 15 15 - 41 U/L   ALT 14 (L) 17 - 63 U/L   Alkaline Phosphatase 224 (H) 38 - 126  U/L   Total Bilirubin 0.9 0.3 - 1.2 mg/dL   Bilirubin, Direct 0.1 0.1 - 0.5 mg/dL   Indirect Bilirubin 0.8 0.3 - 0.9 mg/dL  Lipase, blood     Status: Abnormal   Collection Time: 05/20/18  4:18 AM  Result Value Ref Range   Lipase 179 (H) 11 - 51 U/L  Glucose, capillary     Status: Abnormal   Collection Time: 05/20/18  7:18 AM  Result Value Ref Range   Glucose-Capillary 138 (H) 65 - 99 mg/dL   Comment 1 Notify RN    Comment 2 Document in Chart   Glucose, capillary     Status: Abnormal   Collection Time: 05/20/18 11:38 AM  Result Value Ref Range   Glucose-Capillary 142 (H) 65 - 99 mg/dL   Comment 1 Notify  RN    Comment 2 Document in Chart      No results found.  ROS:  As stated above in the HPI otherwise negative.  Blood pressure (!) 149/80, pulse 71, temperature 97.9 F (36.6 C), temperature source Oral, resp. rate 20, height 6' 2" (1.88 m), weight 83.5 kg (184 lb 1.4 oz), SpO2 100 %.    PE: Gen: NAD, Alert and Oriented HEENT:  [place]/AT, EOMI Neck: Supple, no LAD Lungs: CTA Bilaterally CV: RRR without M/G/R ABM: Soft, NTND, +BS Ext: No C/C/E  Assessment/Plan: 1) Pancreatitis.  Mild.  ? Biliary 2) Slowly progressive metastatic colon cancer. 3) Nausea and vomiting. 4) CAD s/p stent.   His most recent CT scan was reviewed with radiology and there was evidence of some gallbladder sludge.  It was not significant, but certainly present.  There is a suspicion that his current symptoms and findings are consistent with a biliary pancreatitis.  His BUN and HCT are normal.    Plan: 1) Continue with IV hydration.  Uncertain about his current EF, which precludes more aggressive hydration.   2) Follow liver panel, specifically AP.  Damonica Chopra D 05/20/2018, 1:06 PM

## 2018-05-20 NOTE — Progress Notes (Signed)
IP PROGRESS NOTE  Subjective:   Allen Glenn has a history of metastatic colon cancer, currently maintained off of specific therapy.  He reports being diagnosed with diabetes approximately 2 months ago.  He was placed on metformin. For the past 2 weeks he has nausea and intermittent vomiting.  He has been able to eat or drink very little.  He saw Dr. Forde Dandy and was diagnosed with "pancreatitis ".  A CT of the abdomen/pelvis on 05/15/2018 revealed a few small nodules at the lung bases, slight enlargement of liver lesions, and an interval increase in the size of soft tissue implants in the right lower quadrant. He presented to the emergency room yesterday with persistent vomiting and weakness.  He denies pain and fever.  He had one episode of diarrhea this morning after vomiting.  He reports mild headaches.   Objective: Vital signs in last 24 hours: Blood pressure (!) 149/80, pulse 71, temperature 97.9 F (36.6 C), temperature source Oral, resp. rate 20, height 6' 2"  (1.88 m), weight 184 lb 1.4 oz (83.5 kg), SpO2 100 %.  Intake/Output from previous day: 05/27 0701 - 05/28 0700 In: 2956.3 [I.V.:956.3; IV Piggyback:2000] Out: 250 [Urine:250]  Physical Exam:  HEENT: Neck without mass Lungs: Clear bilaterally Cardiac: Regular rate and rhythm Abdomen: Soft and nontender, no hepatosplenomegaly Extremities: No leg edema Lymph nodes: No cervical, supraclavicular, axillary, or inguinal nodes Neurologic: Alert and oriented, the motor exam appears grossly intact in the upper and lower extremities.  Finger-to-nose testing is normal.    Lab Results: Recent Labs    05/19/18 1821 05/20/18 0418  WBC 8.6 6.1  HGB 16.1 13.5  HCT 46.7 41.5  PLT 251 211    BMET Recent Labs    05/19/18 1821 05/20/18 0418  NA 137 141  K 4.4 3.7  CL 102 108  CO2 25 22  GLUCOSE 173* 143*  BUN 10 8  CREATININE 0.88 0.70  CALCIUM 9.0 8.4*   05/20/2018: Alkaline phosphatase 224, lipase 179, AST 15, ALT 14,  bilirubin 0.9  Lab Results  Component Value Date   CEA1 9.40 (H) 02/24/2018   CT: CT images from 05/15/2018-reviewed  Medications: I have reviewed the patient's current medications.  Assessment/plan: 1. 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 No BRAFmutation  Liver biopsy 05/04/2015-metastatic adenocarcinoma consistent with a colon primary ? Staging PET scan 06/08/2015-isolated segment 4A liver lesion ? Initiation of adjuvant FOLFOX 06/13/2015 ? Restaging CT 08/09/2015 revealed a slight decrease in a borderline ileocolic node, decrease in the hepatic dome metastasis, no new lesions ? Liver resection 09/28/2015-pathology consistent with metastatic colon cancer, negative margins ? Adjuvant FOLFOX resumed 11/08/2015, oxaliplatin eliminated beginning 11/22/2015 secondary to neuropathy. He completed adjuvant chemotherapy 02/16/2016 ? Restaging chest CT 11/08/2016, compared to 05/07/2016 revealed a new 9 mm left upper lobe nodule, stable 2.2 cm right hepatic lesion ? PET scan 11/19/2016 confirmed a hypermetabolic left upper lobe nodule, hypermetabolic left paratracheal and pericardiac lymph nodes, and hypermetabolism associated with the hypoattenuating lesion in the dome of the liver ? Status post EBUSbiopsies of the left lingula nodule and a level 4Lnode on 11/28/2016-no evidence of malignancy ? CT chest 02/11/2017-increase in size of the left pulmonary nodule and epicardial lymph node ? Status post SBRT 2 the left lung nodule and mediastinum completed 03/14/2017 ? CTs 07/01/2017-new 9 mm focus along the right liver capsule, stable left subcapsular liver lesion, radiation change at site  of left upper lobe nodule, stable pericardiallymph node ? CTs 11/25/2017-new 5 mm lingular nodule, enlargement of capsular-based right liver lesion, new capsular  based right liver lesion capsular lesion at the hepatic dome ? CTs 02/25/2018- enlargement of small lung nodules, liver lesions and small mesenteric lymph node ? CT abdomen/pelvis 05/15/2018- new lower lobe pulmonary nodules, enlargement of liver lesions and right lower quadrant soft tissue nodules 2. Coronary artery disease status post a myocardial infarction in 2014  3. Hypertension  4. Hyperlipidemia  5.    Diabetes  6.    Admission 05/20/2018 with nausea/vomiting   Allen Glenn has metastatic colon cancer.  There has been slow disease progression in the chest and abdomen over the past few years.  There is no cancer related explanation for the nausea/vomiting on the CT abdomen, but the nausea could be related to progressive carcinomatosis or tumor involving the GI tract.  The nausea/vomiting may be related to pancreatitis, or biliary disease.  I have a low suspicion for a CNS cause for the nausea.  Recommendations: 1.  Gastroenterology consult 2.  Intravenous hydration, antiemetics 3.  I will continue following him in the hospital with the GI and medicine services   LOS: 0 days   Betsy Coder, MD   05/20/2018, 1:57 PM

## 2018-05-20 NOTE — Progress Notes (Signed)
Pt arrived on floor, MD notified.

## 2018-05-21 ENCOUNTER — Inpatient Hospital Stay (HOSPITAL_COMMUNITY): Payer: 59

## 2018-05-21 ENCOUNTER — Encounter (HOSPITAL_COMMUNITY): Payer: Self-pay | Admitting: General Surgery

## 2018-05-21 DIAGNOSIS — K859 Acute pancreatitis without necrosis or infection, unspecified: Secondary | ICD-10-CM | POA: Diagnosis not present

## 2018-05-21 DIAGNOSIS — E119 Type 2 diabetes mellitus without complications: Secondary | ICD-10-CM | POA: Diagnosis not present

## 2018-05-21 DIAGNOSIS — K208 Other esophagitis: Secondary | ICD-10-CM | POA: Diagnosis not present

## 2018-05-21 DIAGNOSIS — I25118 Atherosclerotic heart disease of native coronary artery with other forms of angina pectoris: Secondary | ICD-10-CM | POA: Diagnosis not present

## 2018-05-21 DIAGNOSIS — K851 Biliary acute pancreatitis without necrosis or infection: Secondary | ICD-10-CM | POA: Diagnosis not present

## 2018-05-21 DIAGNOSIS — K209 Esophagitis, unspecified: Secondary | ICD-10-CM | POA: Diagnosis not present

## 2018-05-21 DIAGNOSIS — G43A1 Cyclical vomiting, intractable: Secondary | ICD-10-CM | POA: Diagnosis not present

## 2018-05-21 DIAGNOSIS — R748 Abnormal levels of other serum enzymes: Secondary | ICD-10-CM | POA: Diagnosis not present

## 2018-05-21 DIAGNOSIS — C189 Malignant neoplasm of colon, unspecified: Secondary | ICD-10-CM | POA: Diagnosis not present

## 2018-05-21 DIAGNOSIS — E1142 Type 2 diabetes mellitus with diabetic polyneuropathy: Secondary | ICD-10-CM | POA: Diagnosis present

## 2018-05-21 DIAGNOSIS — Z7982 Long term (current) use of aspirin: Secondary | ICD-10-CM | POA: Diagnosis not present

## 2018-05-21 DIAGNOSIS — N400 Enlarged prostate without lower urinary tract symptoms: Secondary | ICD-10-CM | POA: Diagnosis present

## 2018-05-21 DIAGNOSIS — E785 Hyperlipidemia, unspecified: Secondary | ICD-10-CM | POA: Diagnosis present

## 2018-05-21 DIAGNOSIS — G936 Cerebral edema: Secondary | ICD-10-CM | POA: Diagnosis present

## 2018-05-21 DIAGNOSIS — K2950 Unspecified chronic gastritis without bleeding: Secondary | ICD-10-CM | POA: Diagnosis not present

## 2018-05-21 DIAGNOSIS — K210 Gastro-esophageal reflux disease with esophagitis: Secondary | ICD-10-CM | POA: Diagnosis present

## 2018-05-21 DIAGNOSIS — K861 Other chronic pancreatitis: Secondary | ICD-10-CM | POA: Diagnosis not present

## 2018-05-21 DIAGNOSIS — C787 Secondary malignant neoplasm of liver and intrahepatic bile duct: Secondary | ICD-10-CM | POA: Diagnosis not present

## 2018-05-21 DIAGNOSIS — I10 Essential (primary) hypertension: Secondary | ICD-10-CM | POA: Diagnosis not present

## 2018-05-21 DIAGNOSIS — K8020 Calculus of gallbladder without cholecystitis without obstruction: Secondary | ICD-10-CM | POA: Diagnosis not present

## 2018-05-21 DIAGNOSIS — I110 Hypertensive heart disease with heart failure: Secondary | ICD-10-CM | POA: Diagnosis not present

## 2018-05-21 DIAGNOSIS — E876 Hypokalemia: Secondary | ICD-10-CM | POA: Diagnosis not present

## 2018-05-21 DIAGNOSIS — Z79899 Other long term (current) drug therapy: Secondary | ICD-10-CM | POA: Diagnosis not present

## 2018-05-21 DIAGNOSIS — I2510 Atherosclerotic heart disease of native coronary artery without angina pectoris: Secondary | ICD-10-CM | POA: Diagnosis present

## 2018-05-21 DIAGNOSIS — I252 Old myocardial infarction: Secondary | ICD-10-CM | POA: Diagnosis not present

## 2018-05-21 DIAGNOSIS — R930 Abnormal findings on diagnostic imaging of skull and head, not elsewhere classified: Secondary | ICD-10-CM | POA: Diagnosis not present

## 2018-05-21 DIAGNOSIS — I5032 Chronic diastolic (congestive) heart failure: Secondary | ICD-10-CM | POA: Diagnosis not present

## 2018-05-21 DIAGNOSIS — Z955 Presence of coronary angioplasty implant and graft: Secondary | ICD-10-CM | POA: Diagnosis not present

## 2018-05-21 DIAGNOSIS — M549 Dorsalgia, unspecified: Secondary | ICD-10-CM | POA: Diagnosis present

## 2018-05-21 DIAGNOSIS — K449 Diaphragmatic hernia without obstruction or gangrene: Secondary | ICD-10-CM | POA: Diagnosis not present

## 2018-05-21 DIAGNOSIS — C7931 Secondary malignant neoplasm of brain: Secondary | ICD-10-CM | POA: Diagnosis not present

## 2018-05-21 DIAGNOSIS — E860 Dehydration: Secondary | ICD-10-CM | POA: Diagnosis not present

## 2018-05-21 DIAGNOSIS — R112 Nausea with vomiting, unspecified: Secondary | ICD-10-CM | POA: Diagnosis not present

## 2018-05-21 DIAGNOSIS — I159 Secondary hypertension, unspecified: Secondary | ICD-10-CM | POA: Diagnosis not present

## 2018-05-21 DIAGNOSIS — Z794 Long term (current) use of insulin: Secondary | ICD-10-CM | POA: Diagnosis not present

## 2018-05-21 DIAGNOSIS — C716 Malignant neoplasm of cerebellum: Secondary | ICD-10-CM | POA: Diagnosis not present

## 2018-05-21 DIAGNOSIS — C182 Malignant neoplasm of ascending colon: Secondary | ICD-10-CM | POA: Diagnosis not present

## 2018-05-21 DIAGNOSIS — K2980 Duodenitis without bleeding: Secondary | ICD-10-CM | POA: Diagnosis not present

## 2018-05-21 DIAGNOSIS — Z9049 Acquired absence of other specified parts of digestive tract: Secondary | ICD-10-CM | POA: Diagnosis not present

## 2018-05-21 LAB — CBC WITH DIFFERENTIAL/PLATELET
BASOS PCT: 0 %
Basophils Absolute: 0 10*3/uL (ref 0.0–0.1)
EOS ABS: 0.1 10*3/uL (ref 0.0–0.7)
EOS PCT: 1 %
HCT: 39.5 % (ref 39.0–52.0)
Hemoglobin: 12.7 g/dL — ABNORMAL LOW (ref 13.0–17.0)
LYMPHS ABS: 1.2 10*3/uL (ref 0.7–4.0)
Lymphocytes Relative: 24 %
MCH: 28.1 pg (ref 26.0–34.0)
MCHC: 32.2 g/dL (ref 30.0–36.0)
MCV: 87.4 fL (ref 78.0–100.0)
MONOS PCT: 13 %
Monocytes Absolute: 0.6 10*3/uL (ref 0.1–1.0)
NEUTROS PCT: 62 %
Neutro Abs: 3.1 10*3/uL (ref 1.7–7.7)
Platelets: 205 10*3/uL (ref 150–400)
RBC: 4.52 MIL/uL (ref 4.22–5.81)
RDW: 12.7 % (ref 11.5–15.5)
WBC: 5.1 10*3/uL (ref 4.0–10.5)

## 2018-05-21 LAB — COMPREHENSIVE METABOLIC PANEL
ALBUMIN: 3.1 g/dL — AB (ref 3.5–5.0)
ALK PHOS: 192 U/L — AB (ref 38–126)
ALT: 12 U/L — ABNORMAL LOW (ref 17–63)
AST: 15 U/L (ref 15–41)
Anion gap: 7 (ref 5–15)
BUN: 7 mg/dL (ref 6–20)
CALCIUM: 8.3 mg/dL — AB (ref 8.9–10.3)
CO2: 25 mmol/L (ref 22–32)
Chloride: 108 mmol/L (ref 101–111)
Creatinine, Ser: 0.72 mg/dL (ref 0.61–1.24)
GFR calc non Af Amer: >60 mL/min (ref >60)
GLUCOSE: 127 mg/dL — AB (ref 65–99)
Potassium: 3.5 mmol/L (ref 3.5–5.1)
SODIUM: 140 mmol/L (ref 135–145)
Total Bilirubin: 0.6 mg/dL (ref 0.3–1.2)
Total Protein: 6.1 g/dL — ABNORMAL LOW (ref 6.5–8.1)

## 2018-05-21 LAB — GASTROINTESTINAL PANEL BY PCR, STOOL (REPLACES STOOL CULTURE)
ADENOVIRUS F40/41: NOT DETECTED
Astrovirus: NOT DETECTED
CAMPYLOBACTER SPECIES: NOT DETECTED
CRYPTOSPORIDIUM: NOT DETECTED
Cyclospora cayetanensis: NOT DETECTED
ENTEROPATHOGENIC E COLI (EPEC): NOT DETECTED
Entamoeba histolytica: NOT DETECTED
Enteroaggregative E coli (EAEC): NOT DETECTED
Enterotoxigenic E coli (ETEC): NOT DETECTED
GIARDIA LAMBLIA: NOT DETECTED
Norovirus GI/GII: NOT DETECTED
PLESIMONAS SHIGELLOIDES: NOT DETECTED
ROTAVIRUS A: NOT DETECTED
SALMONELLA SPECIES: NOT DETECTED
SHIGELLA/ENTEROINVASIVE E COLI (EIEC): NOT DETECTED
Sapovirus (I, II, IV, and V): NOT DETECTED
Shiga like toxin producing E coli (STEC): NOT DETECTED
VIBRIO SPECIES: NOT DETECTED
Vibrio cholerae: NOT DETECTED
YERSINIA ENTEROCOLITICA: NOT DETECTED

## 2018-05-21 LAB — GLUCOSE, CAPILLARY
GLUCOSE-CAPILLARY: 92 mg/dL (ref 65–99)
GLUCOSE-CAPILLARY: 172 mg/dL — AB (ref 65–99)
Glucose-Capillary: 125 mg/dL — ABNORMAL HIGH (ref 65–99)
Glucose-Capillary: 112 mg/dL — ABNORMAL HIGH (ref 65–99)

## 2018-05-21 LAB — LIPASE, BLOOD: LIPASE: 272 U/L — AB (ref 11–51)

## 2018-05-21 LAB — MAGNESIUM: Magnesium: 2.3 mg/dL (ref 1.7–2.4)

## 2018-05-21 LAB — PHOSPHORUS: PHOSPHORUS: 3.5 mg/dL (ref 2.5–4.6)

## 2018-05-21 MED ORDER — BOOST / RESOURCE BREEZE PO LIQD CUSTOM
1.0000 | Freq: Two times a day (BID) | ORAL | Status: DC
  Administered 2018-05-21: 1 via ORAL

## 2018-05-21 MED ORDER — ONDANSETRON HCL 4 MG/2ML IJ SOLN
4.0000 mg | Freq: Four times a day (QID) | INTRAMUSCULAR | Status: DC | PRN
  Administered 2018-05-22 – 2018-05-23 (×2): 4 mg via INTRAVENOUS
  Filled 2018-05-21 (×2): qty 2

## 2018-05-21 MED ORDER — PROMETHAZINE HCL 25 MG/ML IJ SOLN
12.5000 mg | Freq: Four times a day (QID) | INTRAMUSCULAR | Status: DC | PRN
Start: 2018-05-21 — End: 2018-05-26
  Administered 2018-05-21 – 2018-05-23 (×3): 12.5 mg via INTRAVENOUS
  Filled 2018-05-21 (×4): qty 1

## 2018-05-21 MED ORDER — ONDANSETRON HCL 4 MG PO TABS: 4.0000 mg | ORAL_TABLET | Freq: Four times a day (QID) | ORAL | Status: DC | PRN

## 2018-05-21 NOTE — Progress Notes (Signed)
PROGRESS NOTE    Allen Glenn  JHE:[id number] DOB: 12-09-1956 DOA: 05/19/2018 PCP: [name], MD   Brief Narrative:  HPI On 05/20/2018 by Dr. Lu Duffel is a 62 y.o. male with history of metastatic colon cancer with slow progression being followed by Dr. [name], oncologist, CAD status post stenting presents to the ER admits in [facility] with complaints of persistent nausea vomiting.  Patient was diagnosed with pancreatitis with elevated lipase by patient's primary care physician last week.  Also had a subsequent CAT scan done which showed progression of his metastasis.  Since then patient has been having progressive nausea vomiting and weakness.  Presents to the ER admits in [facility].  Denies any chest pain or shortness of breath fever or chills.  Assessment & Plan   Nausea, vomiting, possible biliary pancreatitis -CT scan on 05/20/2018 showed stable CT appearance of the abdomen compared to recent prior study.  No new/acute findings.  Suspect small gallstone or possible polyp in the gallbladder.  No findings of acute cholecystitis.  No biliary dilatation. -Lipase on admission 581, improved to 272 today -Gastroenterology, Dr. Almyra Free, consulted and appreciated, recommended surgical consult -GI pathogen panel pending -General surgery consulted and appreciated.  Plan to obtain ultrasound to determine if patient has cholelithiasis.  Given patient's current metastatic disease as well as previous operations, surgical intervention may be very difficult.  Diabetes mellitus, type II -Continue Lantus, insulin sliding scale, CBG monitoring  Coronary artery disease -Stable, no complaints of chest pain -Continue aspirin  Essential hypertension -BP appears to be stable -Continue lisinopril  Metastatic colon cancer -Followed by [name], oncology  BPH -Continue Flomax  DVT Prophylaxis  lovenox  Code Status: Full  Family Communication: Wife at bedside  Disposition  Plan: Admitted, pending further surgical recommendations  Consultants Gastroenterology, Dr. Benson Norway General surgery  Procedures  None  Antibiotics   Anti-infectives (From admission, onward)   None      Subjective:   [name] seen and examined today.  Feels tired this morning.  Denies abdominal pain or vomiting at this time.  Does have some nausea.  Denies current chest pain, shortness of breath, dizziness or headache.   Objective:   Vitals:   05/20/18 0511 05/20/18 1420 05/20/18 2146 05/21/18 0627  BP: (!) 149/80 128/67 (!) 111/58 134/68  Pulse: 71 79 63 67  Resp: 20 20 14 16   Temp: 97.9 F (36.6 C) 99.2 F (37.3 C) 98 F (36.7 C) 98.1 F (36.7 C)  TempSrc: Oral Oral Oral Oral  SpO2: 100% 100% 99% 98%  Weight:      Height:        Intake/Output Summary (Last 24 hours) at 05/21/2018 1421 Last data filed at 05/21/2018 0316 Gross per 24 hour  Intake 2752.08 ml  Output 550 ml  Net 2202.08 ml   Filed Weights   05/19/18 1812 05/19/18 2250  Weight: 82.1 kg (181 lb) 83.5 kg (184 lb 1.4 oz)    Exam  General: Well developed, well nourished, NAD, appears stated age  HEENT: NCAT, mucous membranes moist.   Neck: Supple  Cardiovascular: S1 S2 auscultated, no rubs, murmurs or gallops. Regular rate and rhythm.  Respiratory: Clear to auscultation bilaterally with equal chest rise  Abdomen: Soft, nontender, nondistended, + bowel sounds  Extremities: warm dry without cyanosis clubbing or edema  Neuro: AAOx3, nonfocal  Psych: Pleasant, appropriate mood and affect   Data Reviewed: I have personally reviewed following labs and imaging studies  CBC: Recent Labs  Lab 05/19/18 1821 05/20/18 0418 05/21/18 0359  WBC 8.6 6.1 5.1  NEUTROABS 6.4  --  3.1  HGB 16.1 13.5 12.7*  HCT 46.7 41.5 39.5  MCV 85.8 88.1 87.4  PLT 251 211 382   Basic Metabolic Panel: Recent Labs  Lab 05/19/18 1821 05/20/18 0418 05/21/18 0359  NA 137 141 140  K 4.4 3.7 3.5  CL 102 108  108  CO2 25 22 25   GLUCOSE 173* 143* 127*  BUN 10 8 7   CREATININE 0.88 0.70 0.72  CALCIUM 9.0 8.4* 8.3*  MG  --   --  2.3  PHOS  --   --  3.5   GFR: Estimated Creatinine Clearance: 112.7 mL/min (by C-G formula based on SCr of 0.72 mg/dL). Liver Function Tests: Recent Labs  Lab 05/19/18 1821 05/20/18 0418 05/21/18 0359  AST 22 15 15   ALT 17 14* 12*  ALKPHOS 299* 224* 192*  BILITOT 0.7 0.9 0.6  PROT 8.1 6.6 6.1*  ALBUMIN 4.2 3.4* 3.1*   Recent Labs  Lab 05/19/18 1821 05/20/18 0418 05/21/18 0359  LIPASE 581* 179* 272*   No results for input(s): AMMONIA in the last 168 hours. Coagulation Profile: No results for input(s): INR, PROTIME in the last 168 hours. Cardiac Enzymes: Recent Labs  Lab 05/19/18 1821  TROPONINI <0.03   BNP (last 3 results) No results for input(s): PROBNP in the last 8760 hours. HbA1C: No results for input(s): HGBA1C in the last 72 hours. CBG: Recent Labs  Lab 05/20/18 1138 05/20/18 1726 05/20/18 2148 05/21/18 0731 05/21/18 1132  GLUCAP 142* 102* 121* 125* 112*   Lipid Profile: No results for input(s): CHOL, HDL, LDLCALC, TRIG, CHOLHDL, LDLDIRECT in the last 72 hours. Thyroid Function Tests: No results for input(s): TSH, T4TOTAL, FREET4, T3FREE, THYROIDAB in the last 72 hours. Anemia Panel: No results for input(s): VITAMINB12, FOLATE, FERRITIN, TIBC, IRON, RETICCTPCT in the last 72 hours. Urine analysis:    Component Value Date/Time   COLORURINE YELLOW 05/19/2018 1825   APPEARANCEUR CLEAR 05/19/2018 1825   LABSPEC >1.030 (H) 05/19/2018 1825   PHURINE 6.0 05/19/2018 1825   GLUCOSEU NEGATIVE 05/19/2018 1825   HGBUR NEGATIVE 05/19/2018 1825   BILIRUBINUR NEGATIVE 05/19/2018 1825   KETONESUR 15 (A) 05/19/2018 1825   PROTEINUR NEGATIVE 05/19/2018 1825   NITRITE NEGATIVE 05/19/2018 1825   LEUKOCYTESUR NEGATIVE 05/19/2018 1825   Sepsis Labs: @LABRCNTIP (procalcitonin:4,lacticidven:4)  )No results found for this or any previous visit  (from the past 240 hour(s)).    Radiology Studies: Ct Abdomen W Contrast  Result Date: 05/20/2018 CLINICAL DATA:  New onset nausea and vomiting. EXAM: CT ABDOMEN WITH CONTRAST TECHNIQUE: Multidetector CT imaging of the abdomen was performed using the standard protocol following bolus administration of intravenous contrast. CONTRAST:  153mL ISOVUE-300 IOPAMIDOL (ISOVUE-300) INJECTION 61% COMPARISON:  CT scan 05/15/2018 FINDINGS: Lower chest: Stable pulmonary nodules since the recent CT scan. No acute findings at the lung bases. No pleural effusions. Hepatobiliary: Stable hepatic metastatic disease. No acute hepatic findings. Possible small gallstone dependently in the gallbladder. No CT findings to suggest acute cholecystitis. No common bile duct dilatation. Pancreas: No mass, inflammation or ductal dilatation. Spleen: Normal size.  No focal lesions. Adrenals/Urinary Tract: The adrenal glands and kidneys are unremarkable and stable. Stomach/Bowel: The stomach, duodenum, visualized small bowel and visualize colon are grossly normal. Stable appearance of the colonic anastomosis. Stable omental implant on image number 28. Vascular/Lymphatic: The aorta and branch vessels are stable. Scattered atherosclerotic calcifications. The major venous structures are patent.  Small scattered mesenteric and retroperitoneal lymph nodes. Other: No ascites or abdominal wall hernia. Musculoskeletal: No significant bony findings. IMPRESSION: Stable CT appearance of the abdomen when compared to recent prior study. No new/acute findings. Suspect small gallstone or possible polyp in the gallbladder. No CT findings for acute cholecystitis. No biliary dilatation. Electronically Signed   By: Marijo Sanes M.D.   On: 05/20/2018 16:19     Scheduled Meds: . aspirin EC  81 mg Oral Daily  . enoxaparin (LOVENOX) injection  40 mg Subcutaneous Daily  . feeding supplement  1 Container Oral BID BM  . insulin aspart  0-9 Units Subcutaneous  TID WC  . insulin glargine  10 Units Subcutaneous QHS  . lisinopril  2.5 mg Oral Daily  . tamsulosin  0.4 mg Oral Daily   Continuous Infusions: . sodium chloride 1,000 mL (05/21/18 1105)     LOS: 0 days   Time Spent in minutes   30 minutes  Naod Sweetland D.O. on 05/21/2018 at 2:21 PM  Between 7am to 7pm - Pager - [phone]  After 7pm go to [email or web address] - password [email or web address]  And look for the night coverage person covering for me after hours  [facility] Office  [phone]

## 2018-05-21 NOTE — Progress Notes (Addendum)
Initial Nutrition Assessment  DOCUMENTATION CODES:   (Will assess for malnutrition at follow-up)  INTERVENTION:  - Will order Boost Breeze BID, each supplement provides 250 kcal and 9 grams of protein. - Diet advancement as medically feasible. - Will attempt NFPE at follow-up. - Will continue to monitor for nutrition-related interventions and needs.    NUTRITION DIAGNOSIS:   Increased nutrient needs related to chronic illness, cancer and cancer related treatments, acute illness(pancreatitis) as evidenced by estimated needs.  GOAL:   Patient will meet greater than or equal to 90% of their needs  MONITOR:   Diet advancement, PO intake, Supplement acceptance, Weight trends, Labs, I & O's  REASON FOR ASSESSMENT:   Malnutrition Screening Tool  ASSESSMENT:   62 y.o. male with history of metastatic colon cancer with slow progression being followed by Dr. [name], oncologist, CAD status post stenting presents to the ER admits in [facility] with complaints of persistent nausea vomiting.  Patient was diagnosed with pancreatitis with elevated lipase by patient's primary care physician last week.  Also had a subsequent CAT scan done which showed progression of his metastasis.  Since then patient has been having progressive nausea vomiting and weakness.   BMI indicates normal weight. Spoke with pt and wife, who was at bedside. Pt was feeling very nauseated and was resting with his eyes closed so all information was provided by his wife. The last time pt ate solid food was on 5/19 and after the meal he felt nauseated. After that nausea became persistent and severe and he was noted to have elevated lipase. Pt dx with pancreatitis on 5/21 and was started on CLD. He was progressed to FLD yesterday and was able to consume a few bites of pudding and yogurt.   Pt has felt nauseated with all intakes but has been willing to try as much as he is able. His wife states he is currently NPO pending an  ultrasound and that they are waiting for the surgeon to visit to discuss whether or not pt will undergo surgery. Talked with pt's wife about trying Boost Breeze and she and pt are in agreement with trial of this supplement when diet re-advanced.   Pt's wife reports that on the day of admission pt weighed 181 lbs. Current weight is 184 lbs, but suspect this is falsely elevated by items on the bed during weighing. She states that he has lost a significant amount of weight in the past 1-2 months d/t decreasing appetite prior to acute illness. She states that during that time frame pt had also started having episodes of becoming nauseated when smelling food. PTA he had initially been consuming bone broth frequently throughout the day, but in the few days PTA the smell of it made him too nauseated.   Per chart review, he has lost 23 lbs (11% body weight) in the past 6 months. This is significant for time frame. Of this, he has lost 15 lbs (7.5% body weight) in the past 2.5 months; this is also significant for time frame.   Medications reviewed; sliding scale Novolog, 10 units Lantus/day.  Labs reviewed; CBGs: 125 and 112 mg/dL today, Ca: 8.3 mg/dL, Alk Phos elevated, lipase: 272 units/L.  IVF: NS @ 125 mL/hr.     NUTRITION - FOCUSED PHYSICAL EXAM:  Unable to complete at this time d/t severe nausea.   Diet Order:   Diet Order           Diet full liquid Room service appropriate? Yes; Fluid  consistency: Thin  Diet effective now          EDUCATION NEEDS:   Not appropriate for education at this time  Skin:  Skin Assessment: Reviewed RN Assessment  Last BM:  5/29  Height:   Ht Readings from Last 1 Encounters:  05/19/18 _0  (1.88 m)    Weight:   Wt Readings from Last 1 Encounters:  05/19/18 184 lb 1.4 oz (83.5 kg)    Ideal Body Weight:  86.36 kg  BMI:  Body mass index is 23.64 kg/m.  Estimated Nutritional Needs:   Kcal:  2505-2755 (30-33 kcal/kg)  Protein:  125-140 grams  (1.5 grams/kg)  Fluid:  >/= 2.5 L/day      Jarome Matin, MS, RD, LDN, [facility] Inpatient Clinical Dietitian Pager # [phone] After hours/weekend pager # [phone]

## 2018-05-21 NOTE — Progress Notes (Signed)
Subjective: Feeling better.  Nausea has improved.  Objective: Vital signs in last 24 hours: Temp:  [98 F (36.7 C)-99.2 F (37.3 C)] 98.1 F (36.7 C) (05/29 0627) Pulse Rate:  [63-79] 67 (05/29 0627) Resp:  [14-20] 16 (05/29 0627) BP: (111-134)/(58-68) 134/68 (05/29 0627) SpO2:  [98 %-100 %] 98 % (05/29 0627) Last BM Date: 05/20/18  Intake/Output from previous day: 05/28 0701 - 05/29 0700 In: 2752.1 [I.V.:2752.1] Out: 550 [Urine:550] Intake/Output this shift: No intake/output data recorded.  General appearance: alert and no distress GI: soft, non-tender; bowel sounds normal; no masses,  no organomegaly  Lab Results: Recent Labs    05/19/18 1821 05/20/18 0418 05/21/18 0359  WBC 8.6 6.1 5.1  HGB 16.1 13.5 12.7*  HCT 46.7 41.5 39.5  PLT 251 211 205   BMET Recent Labs    05/19/18 1821 05/20/18 0418 05/21/18 0359  NA 137 141 140  K 4.4 3.7 3.5  CL 102 108 108  CO2 25 22 25   GLUCOSE 173* 143* 127*  BUN 10 8 7   CREATININE 0.88 0.70 0.72  CALCIUM 9.0 8.4* 8.3*   LFT Recent Labs    05/20/18 0418 05/21/18 0359  PROT 6.6 6.1*  ALBUMIN 3.4* 3.1*  AST 15 15  ALT 14* 12*  ALKPHOS 224* 192*  BILITOT 0.9 0.6  BILIDIR 0.1  --   IBILI 0.8  --    PT/INR No results for input(s): LABPROT, INR in the last 72 hours. Hepatitis Panel No results for input(s): HEPBSAG, HCVAB, HEPAIGM, HEPBIGM in the last 72 hours. C-Diff No results for input(s): CDIFFTOX in the last 72 hours. Fecal Lactopherrin No results for input(s): FECLLACTOFRN in the last 72 hours.  Studies/Results: Ct Abdomen W Contrast  Result Date: 05/20/2018 CLINICAL DATA:  New onset nausea and vomiting. EXAM: CT ABDOMEN WITH CONTRAST TECHNIQUE: Multidetector CT imaging of the abdomen was performed using the standard protocol following bolus administration of intravenous contrast. CONTRAST:  134mL ISOVUE-300 IOPAMIDOL (ISOVUE-300) INJECTION 61% COMPARISON:  CT scan 05/15/2018 FINDINGS: Lower chest: Stable  pulmonary nodules since the recent CT scan. No acute findings at the lung bases. No pleural effusions. Hepatobiliary: Stable hepatic metastatic disease. No acute hepatic findings. Possible small gallstone dependently in the gallbladder. No CT findings to suggest acute cholecystitis. No common bile duct dilatation. Pancreas: No mass, inflammation or ductal dilatation. Spleen: Normal size.  No focal lesions. Adrenals/Urinary Tract: The adrenal glands and kidneys are unremarkable and stable. Stomach/Bowel: The stomach, duodenum, visualized small bowel and visualize colon are grossly normal. Stable appearance of the colonic anastomosis. Stable omental implant on image number 28. Vascular/Lymphatic: The aorta and branch vessels are stable. Scattered atherosclerotic calcifications. The major venous structures are patent. Small scattered mesenteric and retroperitoneal lymph nodes. Other: No ascites or abdominal wall hernia. Musculoskeletal: No significant bony findings. IMPRESSION: Stable CT appearance of the abdomen when compared to recent prior study. No new/acute findings. Suspect small gallstone or possible polyp in the gallbladder. No CT findings for acute cholecystitis. No biliary dilatation. Electronically Signed   By: Marijo Sanes M.D.   On: 05/20/2018 16:19    Medications:  Scheduled: . aspirin EC  81 mg Oral Daily  . enoxaparin (LOVENOX) injection  40 mg Subcutaneous Daily  . insulin aspart  0-9 Units Subcutaneous TID WC  . insulin glargine  10 Units Subcutaneous QHS  . lisinopril  2.5 mg Oral Daily  . tamsulosin  0.4 mg Oral Daily   Continuous: . sodium chloride 1,000 mL (05/21/18 0316)  Assessment/Plan: 1) Probable biliary pancreatitis. 2) Cholelithiasis. 3) Stage IV colon cancer - slowly progressive.   Clinically he is better.  He was able to consume soft foods/full liquids yesterday.  His AP has declined and the CT scan was revealing for cholelithiasis without biliary ductal dilation.   At this time it is prudent to request a surgical consultation to consider lap chole.    Plan: 1) Continue with IV hydration. 2) Surgical consultation.   LOS: 0 days   Hiyab Nhem D 05/21/2018, 9:02 AM

## 2018-05-21 NOTE — Consult Note (Addendum)
Allen Glenn [birth year]  [id number].    Requesting MD: [name] Chief Complaint/Reason for Consult: pancreatitis, gallstones  HPI:  This is a 62 yo white male with a history of CAD, MI in 2014, s/p stenting, no longer on plavix, recently diagnosed DM, and a history of stage IV colonic adenocarcinoma.  He underwent a right colectomy in 2016 as well as a left hepatectomy.  Both of these operation were done at [facility] by [name] and [name].  The colectomy was in May and then hepatectomy in October per the wife.  He was treated with chemotherapy at that time by [name].  Apparently due to the evidence of recurrence with carcinomatosis and liver lesions, but its slow progression, the patient has not been on any type of treatment with Q55monthCT scans for the last 2 years.    2 months ago the patient was diagnosed with diabetes mellitus and was started on metformin by his PCP [name]  He tolerated this well and about a month ago was asked to increase in his dose by 1 additional tablet.  The wife states that due to a misunderstanding in his dose when they took an extra pill, it ended up being twice as much as he was supposed to take.  This happened a couple of weeks ago.  They then stopped his metformin to given him a chance to reset.  About a week and a half ago he developed nausea, rare emesis, but no abdominal pain.  He tried to eat some on and off and would get nausea.  His PCP checked his labs and found last week to have a lipase of around 500.  He was placed on clear liquids and seemed to be improving some.  His follow up lipase later in the week was down to 200.  He was told to advance his diet as tolerates.  On Sunday, he ate a biscuit with apple butter and tolerated this, but then woke up Monday with worsening nausea.  He still denies any pain.  Because of persistent nausea, he was referred to the hospital where he was admitted.  He had a CT scan that new pulmonary nodules,  but small, stable and slightly large hepatic met lesions, and increase in size of 3 soft tissues implants in the RLQ.  He was also noted to have a suspected small gallstone or polyp in the gallbladder.  His new lipase was noted to be 581 and now down to 179 yesterday.  GI was consulted and felt he may have biliary pancreatitis and so have asked [place] see him in consultation.  ROS: ROS: please see HPI, otherwise negative.  Family History  Problem Relation Age of Onset  . Hypertension Mother   . Heart disease Mother   . Hypertension Father   . Heart disease Father   . Neuropathy Neg Hx     Past Medical History:  Diagnosis Date  . Acute MI ([place] 2014   acute ST elevation MI  . Anemia   . Colon cancer ([place] 06/13/2016   Status post resection of colon mass is well as he panic metastasis.   . Coronary artery disease   . Enlarged prostate    slightly and takes Flomax daily  . Heart disease   . History of blood transfusion   . Hyperlipidemia    takes Lipitor daily  . Hypertension    takes Lisinopril daily  . Lung nodule  left  . Nocturia   . Numbness    left foot  . Peripheral neuropathy     Past Surgical History:  Procedure Laterality Date  . 1/8 of liver removed    . APPENDECTOMY    . COLONOSCOPY    . CORONARY ANGIOPLASTY     1 stent  . CORONARY STENT PLACEMENT  06-27-2013  . HERNIA REPAIR Left 1991  . LUNG BIOPSY Left 11/28/2016   Procedure: LUNG BIOPSY, left upper lobe;  Surgeon: [name], MD;  Location: [facility];  Service: Thoracic;  Laterality: Left;  . partial coloectomy    . VIDEO BRONCHOSCOPY WITH ENDOBRONCHIAL NAVIGATION N/A 11/28/2016   Procedure: VIDEO BRONCHOSCOPY WITH ENDOBRONCHIAL NAVIGATION;  Surgeon: [name], MD;  Location: [place];  Service: Thoracic;  Laterality: N/A;  . VIDEO BRONCHOSCOPY WITH ENDOBRONCHIAL ULTRASOUND N/A 11/28/2016   Procedure: VIDEO BRONCHOSCOPY WITH ENDOBRONCHIAL ULTRASOUND;  Surgeon: [name], MD;  Location: [facility];  Service: Thoracic;  Laterality: N/A;    Social History:  reports that he has never smoked. He has never used smokeless tobacco. He reports that he does not drink alcohol or use drugs.  Allergies:  Allergies  Allergen Reactions  . No Known Allergies     Medications Prior to Admission  Medication Sig Dispense Refill  . aspirin EC 81 MG tablet Take 81 mg by mouth daily.    Marland Kitchen atorvastatin (LIPITOR) 10 MG tablet Take 10 mg by mouth daily at 6 PM.     . Coenzyme Q10 (CO Q 10 PO) Take 1 capsule by mouth daily.    . Insulin Glargine (BASAGLAR KWIKPEN) 100 UNIT/ML SOPN Inject 10 Units into the skin at bedtime.    Marland Kitchen lisinopril (PRINIVIL,ZESTRIL) 2.5 MG tablet Take 2.5 mg by mouth daily.    . nitroGLYCERIN (NITROSTAT) 0.4 MG SL tablet Place 0.4 mg under the tongue every 5 (five) minutes as needed for chest pain. CALL 911 IF PAIN PERSISTS    . ondansetron (ZOFRAN-ODT) 4 MG disintegrating tablet Take 4 mg by mouth every 8 (eight) hours as needed for nausea or vomiting.    . tamsulosin (FLOMAX) 0.4 MG CAPS capsule Take 0.4 mg by mouth daily.    Marland Kitchen omega-3 acid ethyl esters (LOVAZA) 1 g capsule Take 1 capsule (1 g total) by mouth daily. (Patient not taking: Reported on 05/19/2018) 90 capsule 3     Physical Exam: Blood pressure 134/68, pulse 67, temperature 98.1 F (36.7 C), temperature source Oral, resp. rate 16, height 6' 2"  (1.88 m), weight 83.5 kg (184 lb 1.4 oz), SpO2 98 %. General: pleasant, WD, WN white male who is laying in bed in NAD HEENT: head is normocephalic, atraumatic.  Sclera are noninjected.  PERRL.  Ears and nose without any masses or lesions.  Mouth is pink and moist Heart: regular, rate, and rhythm.  Normal s1,s2. No obvious murmurs, gallops, or rubs noted.  Palpable radial and pedal pulses bilaterally Lungs: CTAB, no wheezes, rhonchi, or rales noted.  Respiratory effort nonlabored Abd: soft, NT, ND, +BS, no masses, hernias, or organomegaly MS: all 4 extremities are  symmetrical with no cyanosis, clubbing, or edema. Skin: warm and dry with no masses, lesions, or rashes Psych: A&Ox3 with a flattened affect, but did receive sedating phenergan this morning.   Results for orders placed or performed during the hospital encounter of 05/19/18 (from the past 48 hour(s))  CBG monitoring, ED     Status: Abnormal   Collection Time: 05/19/18  6:12 PM  Result Value Ref Range   Glucose-Capillary 162 (H) 65 - 99 mg/dL  CBC with Differential     Status: None   Collection Time: 05/19/18  6:21 PM  Result Value Ref Range   WBC 8.6 4.0 - 10.5 K/uL   RBC 5.44 4.22 - 5.81 MIL/uL   Hemoglobin 16.1 13.0 - 17.0 g/dL   HCT 46.7 39.0 - 52.0 %   MCV 85.8 78.0 - 100.0 fL   MCH 29.6 26.0 - 34.0 pg   MCHC 34.5 30.0 - 36.0 g/dL   RDW 12.6 11.5 - 15.5 %   Platelets 251 150 - 400 K/uL   Neutrophils Relative % 75 %   Neutro Abs 6.4 1.7 - 7.7 K/uL   Lymphocytes Relative 13 %   Lymphs Abs 1.2 0.7 - 4.0 K/uL   Monocytes Relative 11 %   Monocytes Absolute 0.9 0.1 - 1.0 K/uL   Eosinophils Relative 1 %   Eosinophils Absolute 0.1 0.0 - 0.7 K/uL   Basophils Relative 0 %   Basophils Absolute 0.0 0.0 - 0.1 K/uL    Comment: Performed at [facility], [address] [place], [place] [address]  Comprehensive metabolic panel     Status: Abnormal   Collection Time: 05/19/18  6:21 PM  Result Value Ref Range   Sodium 137 135 - 145 mmol/L   Potassium 4.4 3.5 - 5.1 mmol/L   Chloride 102 101 - 111 mmol/L   CO2 25 22 - 32 mmol/L   Glucose, Bld 173 (H) 65 - 99 mg/dL   BUN 10 6 - 20 mg/dL   Creatinine, Ser 0.88 0.61 - 1.24 mg/dL   Calcium 9.0 8.9 - 10.3 mg/dL   Total Protein 8.1 6.5 - 8.1 g/dL   Albumin 4.2 3.5 - 5.0 g/dL   AST 22 15 - 41 U/L   ALT 17 17 - 63 U/L   Alkaline Phosphatase 299 (H) 38 - 126 U/L   Total Bilirubin 0.7 0.3 - 1.2 mg/dL   GFR calc non Af Amer >60 >60 mL/min   GFR calc Af Amer >60 >60 mL/min    Comment: (NOTE) The eGFR has been calculated using the  CKD EPI equation. This calculation has not been validated in all clinical situations. eGFR's persistently <60 mL/min signify possible Chronic Kidney Disease.    Anion gap 10 5 - 15    Comment: Performed at [facility], [address] [place], [place] [address]  Troponin I     Status: None   Collection Time: 05/19/18  6:21 PM  Result Value Ref Range   Troponin I <0.03 <0.03 ng/mL    Comment: Performed at [facility], [address] [place], [place] [address]  Lipase, blood     Status: Abnormal   Collection Time: 05/19/18  6:21 PM  Result Value Ref Range   Lipase 581 (H) 11 - 51 U/L    Comment: RESULTS CONFIRMED BY MANUAL DILUTION Performed at [facility], [address] [place], [place] [address]   Urinalysis, Routine w reflex microscopic     Status: Abnormal   Collection Time: 05/19/18  6:25 PM  Result Value Ref Range   Color, Urine YELLOW YELLOW   APPearance CLEAR CLEAR   Specific Gravity, Urine >1.030 (H) 1.005 - 1.030   pH 6.0 5.0 - 8.0   Glucose, UA NEGATIVE NEGATIVE mg/dL   Hgb urine dipstick NEGATIVE NEGATIVE   Bilirubin  Urine NEGATIVE NEGATIVE   Ketones, ur 15 (A) NEGATIVE mg/dL   Protein, ur NEGATIVE NEGATIVE mg/dL   Nitrite NEGATIVE NEGATIVE   Leukocytes, UA NEGATIVE NEGATIVE    Comment: Microscopic not done on urines with negative protein, blood, leukocytes, nitrite, or glucose < 500 mg/dL. Performed at [facility], [address] [place], [place] [address]   Glucose, capillary     Status: Abnormal   Collection Time: 05/20/18 12:38 AM  Result Value Ref Range   Glucose-Capillary 152 (H) 65 - 99 mg/dL   Comment 1 Notify RN    Comment 2 Document in Chart   Glucose, capillary     Status: Abnormal   Collection Time: 05/20/18  2:43 AM  Result Value Ref Range   Glucose-Capillary 138 (H) 65 - 99 mg/dL   Comment 1 Notify RN    Comment 2 Document in Chart   HIV antibody (Routine Testing)     Status: None   Collection  Time: 05/20/18  4:18 AM  Result Value Ref Range   HIV Screen 4th Generation wRfx Non Reactive Non Reactive    Comment: (NOTE) Performed At: [facility] [place], [place] [id number] Rush [name] MD HM:[id number] Performed at [facility], [place] [address] [place], [place] [address]   Basic metabolic panel     Status: Abnormal   Collection Time: 05/20/18  4:18 AM  Result Value Ref Range   Sodium 141 135 - 145 mmol/L   Potassium 3.7 3.5 - 5.1 mmol/L   Chloride 108 101 - 111 mmol/L   CO2 22 22 - 32 mmol/L   Glucose, Bld 143 (H) 65 - 99 mg/dL   BUN 8 6 - 20 mg/dL   Creatinine, Ser 0.70 0.61 - 1.24 mg/dL   Calcium 8.4 (L) 8.9 - 10.3 mg/dL   GFR calc non Af Amer >60 >60 mL/min   GFR calc Af Amer >60 >60 mL/min    Comment: (NOTE) The eGFR has been calculated using the CKD EPI equation. This calculation has not been validated in all clinical situations. eGFR's persistently <60 mL/min signify possible Chronic Kidney Disease.    Anion gap 11 5 - 15    Comment: Performed at [facility], [place] [address], [place], [place] [address]  CBC     Status: None   Collection Time: 05/20/18  4:18 AM  Result Value Ref Range   WBC 6.1 4.0 - 10.5 K/uL   RBC 4.71 4.22 - 5.81 MIL/uL   Hemoglobin 13.5 13.0 - 17.0 g/dL   HCT 41.5 39.0 - 52.0 %   MCV 88.1 78.0 - 100.0 fL   MCH 28.7 26.0 - 34.0 pg   MCHC 32.5 30.0 - 36.0 g/dL   RDW 12.5 11.5 - 15.5 %   Platelets 211 150 - 400 K/uL    Comment: Performed at [facility], [place] [address] [place], [place] [address]  Hepatic function panel     Status: Abnormal   Collection Time: 05/20/18  4:18 AM  Result Value Ref Range   Total Protein 6.6 6.5 - 8.1 g/dL   Albumin 3.4 (L) 3.5 - 5.0 g/dL   AST 15 15 - 41 U/L   ALT 14 (L) 17 - 63 U/L   Alkaline Phosphatase 224 (H) 38 - 126 U/L   Total Bilirubin 0.9 0.3 - 1.2 mg/dL   Bilirubin, Direct 0.1 0.1 - 0.5 mg/dL   Indirect  Bilirubin 0.8 0.3 - 0.9  mg/dL    Comment: Performed at [facility], [place] [address] [place], [place] [address]  Lipase, blood     Status: Abnormal   Collection Time: 05/20/18  4:18 AM  Result Value Ref Range   Lipase 179 (H) 11 - 51 U/L    Comment: Performed at [facility], [place] [address], [place], [place] [address]  Glucose, capillary     Status: Abnormal   Collection Time: 05/20/18  7:18 AM  Result Value Ref Range   Glucose-Capillary 138 (H) 65 - 99 mg/dL   Comment 1 Notify RN    Comment 2 Document in Chart   Glucose, capillary     Status: Abnormal   Collection Time: 05/20/18 11:38 AM  Result Value Ref Range   Glucose-Capillary 142 (H) 65 - 99 mg/dL   Comment 1 Notify RN    Comment 2 Document in Chart   Glucose, capillary     Status: Abnormal   Collection Time: 05/20/18  5:26 PM  Result Value Ref Range   Glucose-Capillary 102 (H) 65 - 99 mg/dL   Comment 1 Notify RN    Comment 2 Document in Chart   Glucose, capillary     Status: Abnormal   Collection Time: 05/20/18  9:48 PM  Result Value Ref Range   Glucose-Capillary 121 (H) 65 - 99 mg/dL  CBC with Differential/Platelet     Status: Abnormal   Collection Time: 05/21/18  3:59 AM  Result Value Ref Range   WBC 5.1 4.0 - 10.5 K/uL   RBC 4.52 4.22 - 5.81 MIL/uL   Hemoglobin 12.7 (L) 13.0 - 17.0 g/dL   HCT 39.5 39.0 - 52.0 %   MCV 87.4 78.0 - 100.0 fL   MCH 28.1 26.0 - 34.0 pg   MCHC 32.2 30.0 - 36.0 g/dL   RDW 12.7 11.5 - 15.5 %   Platelets 205 150 - 400 K/uL   Neutrophils Relative % 62 %   Neutro Abs 3.1 1.7 - 7.7 K/uL   Lymphocytes Relative 24 %   Lymphs Abs 1.2 0.7 - 4.0 K/uL   Monocytes Relative 13 %   Monocytes Absolute 0.6 0.1 - 1.0 K/uL   Eosinophils Relative 1 %   Eosinophils Absolute 0.1 0.0 - 0.7 K/uL   Basophils Relative 0 %   Basophils Absolute 0.0 0.0 - 0.1 K/uL    Comment: Performed at [facility], [place] [address], [place], [place] [address]    Comprehensive metabolic panel     Status: Abnormal   Collection Time: 05/21/18  3:59 AM  Result Value Ref Range   Sodium 140 135 - 145 mmol/L   Potassium 3.5 3.5 - 5.1 mmol/L   Chloride 108 101 - 111 mmol/L   CO2 25 22 - 32 mmol/L   Glucose, Bld 127 (H) 65 - 99 mg/dL   BUN 7 6 - 20 mg/dL   Creatinine, Ser 0.72 0.61 - 1.24 mg/dL   Calcium 8.3 (L) 8.9 - 10.3 mg/dL   Total Protein 6.1 (L) 6.5 - 8.1 g/dL   Albumin 3.1 (L) 3.5 - 5.0 g/dL   AST 15 15 - 41 U/L   ALT 12 (L) 17 - 63 U/L   Alkaline Phosphatase 192 (H) 38 - 126 U/L   Total Bilirubin 0.6 0.3 - 1.2 mg/dL   GFR calc non Af Amer >60 >60 mL/min   GFR calc Af Amer >60 >60 mL/min    Comment: (NOTE) The eGFR has been calculated  using the CKD EPI equation. This calculation has not been validated in all clinical situations. eGFR's persistently <60 mL/min signify possible Chronic Kidney Disease.    Anion gap 7 5 - 15    Comment: Performed at [facility], [place] [address], [place], [place] [address]  Magnesium     Status: None   Collection Time: 05/21/18  3:59 AM  Result Value Ref Range   Magnesium 2.3 1.7 - 2.4 mg/dL    Comment: Performed at [facility], [place] [address] [place], [place] [address]  Phosphorus     Status: None   Collection Time: 05/21/18  3:59 AM  Result Value Ref Range   Phosphorus 3.5 2.5 - 4.6 mg/dL    Comment: Performed at [facility], [place] [address] [place], [place] [address]  Glucose, capillary     Status: Abnormal   Collection Time: 05/21/18  7:31 AM  Result Value Ref Range   Glucose-Capillary 125 (H) 65 - 99 mg/dL   Comment 1 Notify RN    Comment 2 Document in Chart    Ct Abdomen W Contrast  Result Date: 05/20/2018 CLINICAL DATA:  New onset nausea and vomiting. EXAM: CT ABDOMEN WITH CONTRAST TECHNIQUE: Multidetector CT imaging of the abdomen was performed using the standard protocol following bolus administration of intravenous contrast.  CONTRAST:  125m ISOVUE-300 IOPAMIDOL (ISOVUE-300) INJECTION 61% COMPARISON:  CT scan 05/15/2018 FINDINGS: Lower chest: Stable pulmonary nodules since the recent CT scan. No acute findings at the lung bases. No pleural effusions. Hepatobiliary: Stable hepatic metastatic disease. No acute hepatic findings. Possible small gallstone dependently in the gallbladder. No CT findings to suggest acute cholecystitis. No common bile duct dilatation. Pancreas: No mass, inflammation or ductal dilatation. Spleen: Normal size.  No focal lesions. Adrenals/Urinary Tract: The adrenal glands and kidneys are unremarkable and stable. Stomach/Bowel: The stomach, duodenum, visualized small bowel and visualize colon are grossly normal. Stable appearance of the colonic anastomosis. Stable omental implant on image number 28. Vascular/Lymphatic: The aorta and branch vessels are stable. Scattered atherosclerotic calcifications. The major venous structures are patent. Small scattered mesenteric and retroperitoneal lymph nodes. Other: No ascites or abdominal wall hernia. Musculoskeletal: No significant bony findings. IMPRESSION: Stable CT appearance of the abdomen when compared to recent prior study. No new/acute findings. Suspect small gallstone or possible polyp in the gallbladder. No CT findings for acute cholecystitis. No biliary dilatation. Electronically Signed   By: PMarijo SanesM.D.   On: 05/20/2018 16:19      Assessment/Plan Pancreatitis This patient has by labs been noted to have an elevated lipase for the last week and a half that is fluctuating at times.  He has nausea as well, but never had abdominal pain.  He has had a CT scan that does not reveal any evidence of pancreatitis either.  He does possibly have a small stone on his CT Scan vs polyp.  We will first obtain an [place] determine if he truly has cholelithiasis.  However, the patient's story is not straightforward and definitively consistent with biliary pancreatitis.   His situation is complicated by his numerous open abdominal operations including a hepatectomy.  He also has increasing metastatic disease in his abdomen, all of which would make surgical intervention very difficult.  It is also possible that surgery may not resolve his symptoms.  These could be coming from medication induced pancreatitis from his metformin or from increasing mets in his abdomen.  If he ultimately required an operation,  we would first ask if they would prefer to return to [facility] to his previous surgeon.  If they do not wish for this and surgery is felt indicated, then we would need to have a long discussion about possible risks and complications from an operation that would not be straightforward.  We will follow the patient and make further recommendations after his [name] returns and after Dr. Johney Maine' evaluation.  Metastatic colonic adenocarcinoma Increase in metastatic disease in abdomen and lungs, per [name]  DM -per IM  CAD -stable  Henreitta Cea, [facility] 05/21/2018, 11:01 AM Pager: [phone]

## 2018-05-22 ENCOUNTER — Encounter (HOSPITAL_COMMUNITY): Payer: Self-pay

## 2018-05-22 ENCOUNTER — Encounter (HOSPITAL_COMMUNITY)
Admission: EM | Disposition: A | Discharge status: Home / Self Care | Payer: Self-pay | Source: Home / Self Care | Attending: Internal Medicine

## 2018-05-22 ENCOUNTER — Inpatient Hospital Stay (HOSPITAL_COMMUNITY): Payer: 59 | Admitting: Registered Nurse

## 2018-05-22 HISTORY — PX: ESOPHAGOGASTRODUODENOSCOPY: SHX5428

## 2018-05-22 HISTORY — PX: BIOPSY: SHX5522

## 2018-05-22 LAB — COMPREHENSIVE METABOLIC PANEL
ALBUMIN: 3.4 g/dL — AB (ref 3.5–5.0)
ALT: 12 U/L — ABNORMAL LOW (ref 17–63)
ANION GAP: 10 (ref 5–15)
AST: 16 U/L (ref 15–41)
Alkaline Phosphatase: 204 U/L — ABNORMAL HIGH (ref 38–126)
BUN: 7 mg/dL (ref 6–20)
CHLORIDE: 109 mmol/L (ref 101–111)
CO2: 23 mmol/L (ref 22–32)
Calcium: 8.5 mg/dL — ABNORMAL LOW (ref 8.9–10.3)
Creatinine, Ser: 0.67 mg/dL (ref 0.61–1.24)
GFR calc non Af Amer: >60 mL/min (ref >60)
GLUCOSE: 106 mg/dL — AB (ref 65–99)
POTASSIUM: 3.3 mmol/L — AB (ref 3.5–5.1)
Sodium: 142 mmol/L (ref 135–145)
Total Bilirubin: 0.8 mg/dL (ref 0.3–1.2)
Total Protein: 6.3 g/dL — ABNORMAL LOW (ref 6.5–8.1)

## 2018-05-22 LAB — LIPASE, BLOOD: LIPASE: 119 U/L — AB (ref 11–51)

## 2018-05-22 LAB — GLUCOSE, CAPILLARY
GLUCOSE-CAPILLARY: 128 mg/dL — AB (ref 65–99)
Glucose-Capillary: 91 mg/dL (ref 65–99)
Glucose-Capillary: 111 mg/dL — ABNORMAL HIGH (ref 65–99)

## 2018-05-22 LAB — LIPID PANEL
CHOL/HDL RATIO: 5.5 ratio
Cholesterol: 180 mg/dL (ref 0–200)
HDL: 33 mg/dL — AB (ref >40)
LDL CALC: 115 mg/dL — AB (ref 0–99)
TRIGLYCERIDES: 159 mg/dL — AB (ref <150)
VLDL: 32 mg/dL (ref 0–40)

## 2018-05-22 SURGERY — EGD (ESOPHAGOGASTRODUODENOSCOPY)
Anesthesia: Monitor Anesthesia Care | Laterality: Left

## 2018-05-22 MED ORDER — PROPOFOL 10 MG/ML IV BOLUS
INTRAVENOUS | Status: DC | PRN
  Administered 2018-05-22 (×2): 20 mg via INTRAVENOUS

## 2018-05-22 MED ORDER — ONDANSETRON HCL 4 MG/2ML IJ SOLN
INTRAMUSCULAR | Status: DC | PRN
  Administered 2018-05-22: 4 mg via INTRAVENOUS

## 2018-05-22 MED ORDER — LACTATED RINGERS IV SOLN
INTRAVENOUS | Status: DC
  Administered 2018-05-22: 13:00:00 via INTRAVENOUS

## 2018-05-22 MED ORDER — PROPOFOL 500 MG/50ML IV EMUL
INTRAVENOUS | Status: DC | PRN
  Administered 2018-05-22: 120 ug/kg/min via INTRAVENOUS

## 2018-05-22 MED ORDER — POTASSIUM CHLORIDE CRYS ER 20 MEQ PO TBCR: 40.0000 meq | EXTENDED_RELEASE_TABLET | Freq: Once | ORAL | Status: DC

## 2018-05-22 MED ORDER — PANTOPRAZOLE SODIUM 40 MG PO TBEC
40.0000 mg | DELAYED_RELEASE_TABLET | Freq: Every day | ORAL | Status: DC
  Administered 2018-05-22 – 2018-05-26 (×4): 40 mg via ORAL
  Filled 2018-05-22 (×5): qty 1

## 2018-05-22 MED ORDER — ONDANSETRON HCL 4 MG/2ML IJ SOLN
INTRAMUSCULAR | Status: AC
  Filled 2018-05-22: qty 2

## 2018-05-22 MED ORDER — PROPOFOL 10 MG/ML IV BOLUS
INTRAVENOUS | Status: AC
  Filled 2018-05-22: qty 40

## 2018-05-22 MED ORDER — FAMOTIDINE IN NACL 20-0.9 MG/50ML-% IV SOLN
20.0000 mg | Freq: Two times a day (BID) | INTRAVENOUS | Status: DC
  Administered 2018-05-22 – 2018-05-26 (×8): 20 mg via INTRAVENOUS
  Filled 2018-05-22 (×9): qty 50

## 2018-05-22 MED ORDER — SODIUM CHLORIDE 0.9 % IV SOLN: INTRAVENOUS | Status: DC

## 2018-05-22 MED ORDER — POTASSIUM CHLORIDE 10 MEQ/100ML IV SOLN
10.0000 meq | INTRAVENOUS | Status: AC
  Administered 2018-05-22 (×3): 10 meq via INTRAVENOUS
  Filled 2018-05-22 (×2): qty 100

## 2018-05-22 NOTE — Progress Notes (Signed)
Subjective: No better.  Objective: Vital signs in last 24 hours: Temp:  [98.4 F (36.9 C)-98.8 F (37.1 C)] 98.4 F (36.9 C) (05/30 0443) Pulse Rate:  [73-76] 73 (05/30 0443) Resp:  [18-20] 20 (05/30 0443) BP: (151-156)/(62-82) 156/82 (05/30 0443) SpO2:  [98 %-99 %] 98 % (05/30 0443) Last BM Date: 05/21/18  Intake/Output from previous day: No intake/output data recorded. Intake/Output this shift: No intake/output data recorded.  General appearance: alert and no distress GI: soft, non-tender; bowel sounds normal; no masses,  no organomegaly  Lab Results: Recent Labs    05/19/18 1821 05/20/18 0418 05/21/18 0359  WBC 8.6 6.1 5.1  HGB 16.1 13.5 12.7*  HCT 46.7 41.5 39.5  PLT 251 211 205   BMET Recent Labs    05/20/18 0418 05/21/18 0359 05/22/18 0331  NA 141 140 142  K 3.7 3.5 3.3*  CL 108 108 109  CO2 22 25 23   GLUCOSE 143* 127* 106*  BUN 8 7 7   CREATININE 0.70 0.72 0.67  CALCIUM 8.4* 8.3* 8.5*   LFT Recent Labs    05/20/18 0418  05/22/18 0331  PROT 6.6   < > 6.3*  ALBUMIN 3.4*   < > 3.4*  AST 15   < > 16  ALT 14*   < > 12*  ALKPHOS 224*   < > 204*  BILITOT 0.9   < > 0.8  BILIDIR 0.1  --   --   IBILI 0.8  --   --    < > = values in this interval not displayed.   PT/INR No results for input(s): LABPROT, INR in the last 72 hours. Hepatitis Panel No results for input(s): HEPBSAG, HCVAB, HEPAIGM, HEPBIGM in the last 72 hours. C-Diff No results for input(s): CDIFFTOX in the last 72 hours. Fecal Lactopherrin No results for input(s): FECLLACTOFRN in the last 72 hours.  Studies/Results: Ct Abdomen W Contrast  Result Date: 05/20/2018 CLINICAL DATA:  New onset nausea and vomiting. EXAM: CT ABDOMEN WITH CONTRAST TECHNIQUE: Multidetector CT imaging of the abdomen was performed using the standard protocol following bolus administration of intravenous contrast. CONTRAST:  116mL ISOVUE-300 IOPAMIDOL (ISOVUE-300) INJECTION 61% COMPARISON:  CT scan 05/15/2018  FINDINGS: Lower chest: Stable pulmonary nodules since the recent CT scan. No acute findings at the lung bases. No pleural effusions. Hepatobiliary: Stable hepatic metastatic disease. No acute hepatic findings. Possible small gallstone dependently in the gallbladder. No CT findings to suggest acute cholecystitis. No common bile duct dilatation. Pancreas: No mass, inflammation or ductal dilatation. Spleen: Normal size.  No focal lesions. Adrenals/Urinary Tract: The adrenal glands and kidneys are unremarkable and stable. Stomach/Bowel: The stomach, duodenum, visualized small bowel and visualize colon are grossly normal. Stable appearance of the colonic anastomosis. Stable omental implant on image number 28. Vascular/Lymphatic: The aorta and branch vessels are stable. Scattered atherosclerotic calcifications. The major venous structures are patent. Small scattered mesenteric and retroperitoneal lymph nodes. Other: No ascites or abdominal wall hernia. Musculoskeletal: No significant bony findings. IMPRESSION: Stable CT appearance of the abdomen when compared to recent prior study. No new/acute findings. Suspect small gallstone or possible polyp in the gallbladder. No CT findings for acute cholecystitis. No biliary dilatation. Electronically Signed   By: Marijo Sanes M.D.   On: 05/20/2018 16:19   US Abdomen Limited Ruq  Result Date: 05/21/2018 CLINICAL DATA:  Gallstone or polyp noted on recent CT. Metastatic colon cancer. EXAM: ULTRASOUND ABDOMEN LIMITED RIGHT UPPER QUADRANT COMPARISON:  CT from previous day FINDINGS: Gallbladder: No  gallstones or wall thickening visualized. No polyp identified. No sonographic Murphy sign noted by sonographer. Common bile duct: Diameter: 5.1 mm, unremarkable Liver: 2 hypoechoic right lobe lesions 1 measuring 2.7 x 2.5 cm, the other 3.8 x 3.7 x 2.7 cm. Background parenchyma unremarkable. No biliary ductal dilatation. Portal vein is patent on color Doppler imaging with normal direction  of blood flow towards the liver. IMPRESSION: 1. Normal gallbladder. 2. Liver metastases as previously demonstrated on CT. Electronically Signed   By: Lucrezia Europe M.D.   On: 05/21/2018 17:31    Medications:  Scheduled: . aspirin EC  81 mg Oral Daily  . enoxaparin (LOVENOX) injection  40 mg Subcutaneous Daily  . feeding supplement  1 Container Oral BID BM  . insulin aspart  0-9 Units Subcutaneous TID WC  . insulin glargine  10 Units Subcutaneous QHS  . lisinopril  2.5 mg Oral Daily  . pantoprazole  40 mg Oral Daily  . potassium chloride  40 mEq Oral Once  . tamsulosin  0.4 mg Oral Daily   Continuous: . sodium chloride 1,000 mL (05/22/18 0313)  . famotidine (PEPCID) IV      Assessment/Plan: 1) Persistent nausea and vomiting. 2) Pancreatitis - ? Etiology. 3) Stage IV colon cancer.   The RUQ U/S was negative for any stones.  Overall his AP has trended downwards.  There is progression of mets in his liver, which could be the source of the elevated AP.  The source of his pancreatitis is unclear, however, this diagnosis is with his lipase and some symptoms consistent with pancreatitis.  CT scan was performed before adequate hydration and it was read as no pancreatitis.  A biliary etiology seems less likely at this time.  Plan: 1) EGD today. 2) Start IV Pepcid. 3) He will try to take pantoprazole orally.  (There is a Producer, television/film/video of IV pantoprazole. 4) He will need to have TPN if the EGD is unrevealing for any correctable causes of his symptoms.  LOS: 1 day   Vangie Henthorn D 05/22/2018, 9:09 AM

## 2018-05-22 NOTE — Anesthesia Preprocedure Evaluation (Addendum)
Anesthesia Evaluation  Patient identified by MRN, date of birth, ID band Patient awake    Reviewed: Allergy & Precautions, NPO status , Patient's Chart, lab work & pertinent test results  Airway Mallampati: II  TM Distance: >3 FB Neck ROM: Full    Dental   Pulmonary neg pulmonary ROS,    breath sounds clear to auscultation       Cardiovascular hypertension, Pt. on medications + CAD, + Past MI and + Cardiac Stents   Rhythm:Regular Rate:Normal     Neuro/Psych  Neuromuscular disease    GI/Hepatic Neg liver ROS, Metastatic colon CA   Endo/Other  negative endocrine ROS  Renal/GU negative Renal ROS     Musculoskeletal   Abdominal   Peds  Hematology  (+) anemia ,   Anesthesia Other Findings   Reproductive/Obstetrics                             Lab Results  Component Value Date   WBC 5.1 05/21/2018   HGB 12.7 (L) 05/21/2018   HCT 39.5 05/21/2018   MCV 87.4 05/21/2018   PLT 205 05/21/2018   Lab Results  Component Value Date   CREATININE 0.67 05/22/2018   BUN 7 05/22/2018   NA 142 05/22/2018   K 3.3 (L) 05/22/2018   CL 109 05/22/2018   CO2 23 05/22/2018    Anesthesia Physical Anesthesia Plan  ASA: III  Anesthesia Plan: MAC   Post-op Pain Management:    Induction: Intravenous  PONV Risk Score and Plan: 1 and Propofol infusion, Ondansetron and Treatment may vary due to age or medical condition  Airway Management Planned: Natural Airway and Nasal Cannula  Additional Equipment:   Intra-op Plan:   Post-operative Plan:   Informed Consent: I have reviewed the patients History and Physical, chart, labs and discussed the procedure including the risks, benefits and alternatives for the proposed anesthesia with the patient or authorized representative who has indicated his/her understanding and acceptance.     Plan Discussed with:   Anesthesia Plan Comments:          Anesthesia Quick Evaluation

## 2018-05-22 NOTE — Progress Notes (Addendum)
PROGRESS NOTE    Allen Glenn  URK:[id number] DOB: [birth year] DOA: 05/19/2018 PCP: [name], MD   Brief Narrative:  HPI On 05/20/2018 by Dr. Lu Duffel is a 62 y.o. male with history of metastatic colon cancer with slow progression being followed by Dr. [name], oncologist, CAD status post stenting presents to the ER admits in [facility] with complaints of persistent nausea vomiting.  Patient was diagnosed with pancreatitis with elevated lipase by patient's primary care physician last week.  Also had a subsequent CAT scan done which showed progression of his metastasis.  Since then patient has been having progressive nausea vomiting and weakness.  Presents to the ER admits in [facility].  Denies any chest pain or shortness of breath fever or chills.  Assessment & Plan   Nausea, vomiting, possible pancreatitis -CT scan on 05/20/2018 showed stable CT appearance of the abdomen compared to recent prior study.  No new/acute findings.  Suspect small gallstone or possible polyp in the gallbladder.  No findings of acute cholecystitis.  No biliary dilatation. -Lipase on admission 581, improved to 119 today -Gastroenterology, Dr. Almyra Free, consulted and appreciated, recommended surgical consult -GI pathogen panel pending -General surgery consulted and appreciated.  Plan to obtain ultrasound to determine if patient has cholelithiasis.  Given patient's current metastatic disease as well as previous operations, surgical intervention may be very difficult. -RUQ Abd ultrasound: Gallbladder.  Liver metastasis as previously demonstrated on CT -Check triglycerides, 159 -At this time, unknown cause of patient's pancreatitis. -GI planning for EGD today, general surgery has ordered HIDA scan for today as well  Diabetes mellitus, type II -Continue Lantus, insulin sliding scale, CBG monitoring  Coronary artery disease -Stable, no complaints of chest pain -Continue aspirin  Essential  hypertension -BP appears to be stable -Continue lisinopril  Metastatic colon cancer -Followed by [name], oncology  BPH -Continue Flomax  Hypokalemia -will replace and continue to monitor   DVT Prophylaxis  lovenox  Code Status: Full  Family Communication: Wife at bedside  Disposition Plan: Admitted, pending further work up  Consultants Gastroenterology, Dr. Benson Norway General surgery  Procedures  RUQ Abd [name]  Antibiotics   Anti-infectives (From admission, onward)   None      Subjective:   Allen Glenn seen and examined today.  Continues to have nausea with some vomiting this morning.  Recently received Phenergan this morning.  Denies current chest pain, shortness of breath, abdominal pain, dizziness or headache.  Objective:   Vitals:   05/21/18 0627 05/21/18 1502 05/21/18 2012 05/22/18 0443  BP: 134/68 (!) 151/62 (!) 151/79 (!) 156/82  Pulse: 67 73 76 73  Resp: 16 18 20 20   Temp: 98.1 F (36.7 C) 98.8 F (37.1 C) 98.7 F (37.1 C) 98.4 F (36.9 C)  TempSrc: Oral Oral Oral Oral  SpO2: 98% 99% 99% 98%  Weight:      Height:       No intake or output data in the 24 hours ending 05/22/18 1047 Filed Weights   05/19/18 1812 05/19/18 2250  Weight: 82.1 kg (181 lb) 83.5 kg (184 lb 1.4 oz)   Exam  General: Well developed, well nourished, NAD, appears stated age  HEENT: NCAT, mucous membranes moist.   Neck: Supple  Cardiovascular: S1 S2 auscultated, RRR, no murmur   Respiratory: Clear to auscultation bilaterally with equal chest rise  Abdomen: Soft, nontender, nondistended, + bowel sounds  Extremities: warm dry without cyanosis clubbing or edema  Neuro: AAOx3, nonfocal   Psych: Pleasant,  appropriate mood and affect  Data Reviewed: I have personally reviewed following labs and imaging studies  CBC: Recent Labs  Lab 05/19/18 1821 05/20/18 0418 05/21/18 0359  WBC 8.6 6.1 5.1  NEUTROABS 6.4  --  3.1  HGB 16.1 13.5 12.7*  HCT 46.7 41.5 39.5  MCV  85.8 88.1 87.4  PLT 251 211 229   Basic Metabolic Panel: Recent Labs  Lab 05/19/18 1821 05/20/18 0418 05/21/18 0359 05/22/18 0331  NA 137 141 140 142  K 4.4 3.7 3.5 3.3*  CL 102 108 108 109  CO2 25 22 25 23   GLUCOSE 173* 143* 127* 106*  BUN 10 8 7 7   CREATININE 0.88 0.70 0.72 0.67  CALCIUM 9.0 8.4* 8.3* 8.5*  MG  --   --  2.3  --   PHOS  --   --  3.5  --    GFR: Estimated Creatinine Clearance: 112.7 mL/min (by C-G formula based on SCr of 0.67 mg/dL). Liver Function Tests: Recent Labs  Lab 05/19/18 1821 05/20/18 0418 05/21/18 0359 05/22/18 0331  AST 22 15 15 16   ALT 17 14* 12* 12*  ALKPHOS 299* 224* 192* 204*  BILITOT 0.7 0.9 0.6 0.8  PROT 8.1 6.6 6.1* 6.3*  ALBUMIN 4.2 3.4* 3.1* 3.4*   Recent Labs  Lab 05/19/18 1821 05/20/18 0418 05/21/18 0359 05/22/18 0331  LIPASE 581* 179* 272* 119*   No results for input(s): AMMONIA in the last 168 hours. Coagulation Profile: No results for input(s): INR, PROTIME in the last 168 hours. Cardiac Enzymes: Recent Labs  Lab 05/19/18 1821  TROPONINI <0.03   BNP (last 3 results) No results for input(s): PROBNP in the last 8760 hours. HbA1C: No results for input(s): HGBA1C in the last 72 hours. CBG: Recent Labs  Lab 05/21/18 0731 05/21/18 1132 05/21/18 1641 05/21/18 2054 05/22/18 0739  GLUCAP 125* 112* 92 172* 128*   Lipid Profile: Recent Labs    05/22/18 0828  CHOL 180  HDL 33*  LDLCALC 115*  TRIG 159*  CHOLHDL 5.5   Thyroid Function Tests: No results for input(s): TSH, T4TOTAL, FREET4, T3FREE, THYROIDAB in the last 72 hours. Anemia Panel: No results for input(s): VITAMINB12, FOLATE, FERRITIN, TIBC, IRON, RETICCTPCT in the last 72 hours. Urine analysis:    Component Value Date/Time   COLORURINE YELLOW 05/19/2018 1825   APPEARANCEUR CLEAR 05/19/2018 1825   LABSPEC >1.030 (H) 05/19/2018 1825   PHURINE 6.0 05/19/2018 1825   GLUCOSEU NEGATIVE 05/19/2018 1825   HGBUR NEGATIVE 05/19/2018 1825    BILIRUBINUR NEGATIVE 05/19/2018 1825   KETONESUR 15 (A) 05/19/2018 1825   PROTEINUR NEGATIVE 05/19/2018 1825   NITRITE NEGATIVE 05/19/2018 1825   LEUKOCYTESUR NEGATIVE 05/19/2018 1825   Sepsis Labs: @LABRCNTIP (procalcitonin:4,lacticidven:4)  ) Recent Results (from the past 240 hour(s))  Gastrointestinal Panel by PCR , Stool     Status: None   Collection Time: 05/21/18 11:09 AM  Result Value Ref Range Status   Campylobacter species NOT DETECTED NOT DETECTED Final   Plesimonas shigelloides NOT DETECTED NOT DETECTED Final   Salmonella species NOT DETECTED NOT DETECTED Final   Yersinia enterocolitica NOT DETECTED NOT DETECTED Final   Vibrio species NOT DETECTED NOT DETECTED Final   Vibrio cholerae NOT DETECTED NOT DETECTED Final   Enteroaggregative E coli (EAEC) NOT DETECTED NOT DETECTED Final   Enteropathogenic E coli (EPEC) NOT DETECTED NOT DETECTED Final   Enterotoxigenic E coli (ETEC) NOT DETECTED NOT DETECTED Final   Shiga like toxin producing E coli (STEC) NOT DETECTED  NOT DETECTED Final   Shigella/Enteroinvasive E coli (EIEC) NOT DETECTED NOT DETECTED Final   Cryptosporidium NOT DETECTED NOT DETECTED Final   Cyclospora cayetanensis NOT DETECTED NOT DETECTED Final   Entamoeba histolytica NOT DETECTED NOT DETECTED Final   Giardia lamblia NOT DETECTED NOT DETECTED Final   Adenovirus F40/41 NOT DETECTED NOT DETECTED Final   Astrovirus NOT DETECTED NOT DETECTED Final   Norovirus GI/GII NOT DETECTED NOT DETECTED Final   Rotavirus A NOT DETECTED NOT DETECTED Final   Sapovirus (I, II, IV, and V) NOT DETECTED NOT DETECTED Final    Comment: Performed at [facility], [address], [place], [place] [address]      Radiology Studies: Ct Abdomen W Contrast  Result Date: 05/20/2018 CLINICAL DATA:  New onset nausea and vomiting. EXAM: CT ABDOMEN WITH CONTRAST TECHNIQUE: Multidetector CT imaging of the abdomen was performed using the standard protocol following bolus  administration of intravenous contrast. CONTRAST:  158mL ISOVUE-300 IOPAMIDOL (ISOVUE-300) INJECTION 61% COMPARISON:  CT scan 05/15/2018 FINDINGS: Lower chest: Stable pulmonary nodules since the recent CT scan. No acute findings at the lung bases. No pleural effusions. Hepatobiliary: Stable hepatic metastatic disease. No acute hepatic findings. Possible small gallstone dependently in the gallbladder. No CT findings to suggest acute cholecystitis. No common bile duct dilatation. Pancreas: No mass, inflammation or ductal dilatation. Spleen: Normal size.  No focal lesions. Adrenals/Urinary Tract: The adrenal glands and kidneys are unremarkable and stable. Stomach/Bowel: The stomach, duodenum, visualized small bowel and visualize colon are grossly normal. Stable appearance of the colonic anastomosis. Stable omental implant on image number 28. Vascular/Lymphatic: The aorta and branch vessels are stable. Scattered atherosclerotic calcifications. The major venous structures are patent. Small scattered mesenteric and retroperitoneal lymph nodes. Other: No ascites or abdominal wall hernia. Musculoskeletal: No significant bony findings. IMPRESSION: Stable CT appearance of the abdomen when compared to recent prior study. No new/acute findings. Suspect small gallstone or possible polyp in the gallbladder. No CT findings for acute cholecystitis. No biliary dilatation. Electronically Signed   By: Marijo Sanes M.D.   On: 05/20/2018 16:19   US Abdomen Limited Ruq  Result Date: 05/21/2018 CLINICAL DATA:  Gallstone or polyp noted on recent CT. Metastatic colon cancer. EXAM: ULTRASOUND ABDOMEN LIMITED RIGHT UPPER QUADRANT COMPARISON:  CT from previous day FINDINGS: Gallbladder: No gallstones or wall thickening visualized. No polyp identified. No sonographic Murphy sign noted by sonographer. Common bile duct: Diameter: 5.1 mm, unremarkable Liver: 2 hypoechoic right lobe lesions 1 measuring 2.7 x 2.5 cm, the other 3.8 x 3.7 x 2.7  cm. Background parenchyma unremarkable. No biliary ductal dilatation. Portal vein is patent on color Doppler imaging with normal direction of blood flow towards the liver. IMPRESSION: 1. Normal gallbladder. 2. Liver metastases as previously demonstrated on CT. Electronically Signed   By: Lucrezia Europe M.D.   On: 05/21/2018 17:31     Scheduled Meds: . aspirin EC  81 mg Oral Daily  . enoxaparin (LOVENOX) injection  40 mg Subcutaneous Daily  . feeding supplement  1 Container Oral BID BM  . insulin aspart  0-9 Units Subcutaneous TID WC  . insulin glargine  10 Units Subcutaneous QHS  . lisinopril  2.5 mg Oral Daily  . pantoprazole  40 mg Oral Daily  . potassium chloride  40 mEq Oral Once  . tamsulosin  0.4 mg Oral Daily   Continuous Infusions: . sodium chloride 1,000 mL (05/22/18 0313)  . famotidine (PEPCID) IV 20 mg (05/22/18 1018)  LOS: 1 day   Time Spent in minutes   30 minutes  Tuyen Uncapher D.O. on 05/22/2018 at 10:47 AM  Between 7am to 7pm - Pager - [phone]  After 7pm go to [email or web address] - password [email or web address]  And look for the night coverage person covering for me after hours  [facility] Office  [phone]

## 2018-05-22 NOTE — Transfer of Care (Signed)
Immediate Anesthesia Transfer of Care Note  Patient: Allen Glenn  Procedure(s) Performed: ESOPHAGOGASTRODUODENOSCOPY (EGD) (Left ) BIOPSY  Patient Location: PACU and Endoscopy Unit  Anesthesia Type:MAC  Level of Consciousness: awake, alert , oriented and patient cooperative  Airway & Oxygen Therapy: Patient Spontanous Breathing and Patient connected to nasal cannula oxygen  Post-op Assessment: Report given to RN, Post -op Vital signs reviewed and stable and Patient moving all extremities  Post vital signs: Reviewed and stable  Last Vitals:  Vitals Value Taken Time  BP    Temp    Pulse 74 05/22/2018  1:39 PM  Resp 18 05/22/2018  1:39 PM  SpO2 99 % 05/22/2018  1:39 PM  Vitals shown include unvalidated device data.  Last Pain:  Vitals:   05/22/18 1225  TempSrc: Oral  PainSc: 0-No pain         Complications: No apparent anesthesia complications

## 2018-05-22 NOTE — Interval H&P Note (Signed)
History and Physical Interval Note:  05/22/2018 1:14 PM  Allen Glenn  has presented today for surgery, with the diagnosis of Nausea and vomiting  The various methods of treatment have been discussed with the patient and family. After consideration of risks, benefits and other options for treatment, the patient has consented to  Procedure(s): ESOPHAGOGASTRODUODENOSCOPY (EGD) (Left) as a surgical intervention .  The patient's history has been reviewed, patient examined, no change in status, stable for surgery.  I have reviewed the patient's chart and labs.  Questions were answered to the patient's satisfaction.     Yavier Snider D

## 2018-05-22 NOTE — Anesthesia Postprocedure Evaluation (Signed)
Anesthesia Post Note  Patient: Allen Glenn  Procedure(s) Performed: ESOPHAGOGASTRODUODENOSCOPY (EGD) (Left ) BIOPSY     Patient location during evaluation: PACU Anesthesia Type: MAC Level of consciousness: awake and alert Pain management: pain level controlled Vital Signs Assessment: post-procedure vital signs reviewed and stable Respiratory status: spontaneous breathing, nonlabored ventilation, respiratory function stable and patient connected to nasal cannula oxygen Cardiovascular status: stable and blood pressure returned to baseline Postop Assessment: no apparent nausea or vomiting Anesthetic complications: no    Last Vitals:  Vitals:   05/22/18 1350 05/22/18 1410  BP:  (!) 155/77  Pulse: 66   Resp: (!) 23   Temp:    SpO2: 99%     Last Pain:  Vitals:   05/22/18 1410  TempSrc:   PainSc: 0-No pain                 Tiajuana Amass

## 2018-05-22 NOTE — Op Note (Signed)
[facility] Patient Name: Allen Glenn Procedure Date: 05/22/2018 MRN: [id number] Attending MD: Carol Ada , MD Date of Birth: [birth year] CSN: [id number] Age: 62 Admit Type: Inpatient Procedure:                Upper GI endoscopy Indications:              Nausea with vomiting Providers:                Carol Ada, MD, Angus Seller, Janie Billups,                            Technician, Courtney Heys. Arm[place], CRNA Referring MD:              Medicines:                Propofol per Anesthesia Complications:            No immediate complications. Estimated Blood Loss:     Estimated blood loss was minimal. Procedure:                Pre-Anesthesia Assessment:                           - Prior to the procedure, a History and Physical                            was performed, and patient medications and                            allergies were reviewed. The patient's tolerance of                            previous anesthesia was also reviewed. The risks                            and benefits of the procedure and the sedation                            options and risks were discussed with the patient.                            All questions were answered, and informed consent                            was obtained. Prior Anticoagulants: The patient has                            taken no previous anticoagulant or antiplatelet                            agents. ASA Grade Assessment: III - A patient with                            severe systemic disease. After reviewing the risks  and benefits, the patient was deemed in                            satisfactory condition to undergo the procedure.                           - Sedation was administered by an anesthesia                            professional. Deep sedation was attained.                           After obtaining informed consent, the endoscope was                            passed under  direct vision. Throughout the                            procedure, the patient's blood pressure, pulse, and                            oxygen saturations were monitored continuously. The                            EG-[id number] ([id number]) scope was introduced through the                            mouth, and advanced to the second part of duodenum.                            The upper GI endoscopy was accomplished without                            difficulty. The patient tolerated the procedure                            well. Scope In: Scope Out: Findings:      Mildly severe esophagitis with no bleeding was found at the       gastroesophageal junction.      A 3 cm hiatal hernia was present.      The entire examined stomach was normal. Biopsies were taken with a cold       forceps for Helicobacter pylori testing.      Patchy mild inflammation characterized by erythema was found in the       duodenal bulb.      There is evidence to suggest mucosal irritation consistent with a reflux       esophagitis, but it was very mild. It is unclear if this is a primary       GERD issue versus secondary, i.e., the vomiting causing the changes at       the GE junction. Impression:               - Mildly severe reflux esophagitis.                           -  3 cm hiatal hernia.                           - Normal stomach. Biopsied.                           - Duodenitis. Moderate Sedation:      N/A- Per Anesthesia Care Recommendation:           - Return patient to hospital ward for ongoing care.                           - Clear liquid diet.                           - Continue present medications.                           - Await pathology results.                           - Okay with HIDA scan.                           - Start TPN. Procedure Code(s):        --- Professional ---                           [phone], Esophagogastroduodenoscopy, flexible,                            transoral; with  biopsy, single or multiple Diagnosis Code(s):        --- Professional ---                           K21.0, Gastro-esophageal reflux disease with                            esophagitis                           K44.9, Diaphragmatic hernia without obstruction or                            gangrene                           K29.80, Duodenitis without bleeding                           R11.2, Nausea with vomiting, unspecified CPT copyright 2017 [employer]. All rights reserved. The codes documented in this report are preliminary and upon coder review may  be revised to meet current compliance requirements. Carol Ada, MD Carol Ada, MD 05/22/2018 1:40:41 PM This report has been signed electronically. Number of Addenda: 0

## 2018-05-22 NOTE — H&P (View-Only) (Signed)
Subjective: No better.  Objective: Vital signs in last 24 hours: Temp:  [98.4 F (36.9 C)-98.8 F (37.1 C)] 98.4 F (36.9 C) (05/30 0443) Pulse Rate:  [73-76] 73 (05/30 0443) Resp:  [18-20] 20 (05/30 0443) BP: (151-156)/(62-82) 156/82 (05/30 0443) SpO2:  [98 %-99 %] 98 % (05/30 0443) Last BM Date: 05/21/18  Intake/Output from previous day: No intake/output data recorded. Intake/Output this shift: No intake/output data recorded.  General appearance: alert and no distress GI: soft, non-tender; bowel sounds normal; no masses,  no organomegaly  Lab Results: Recent Labs    05/19/18 1821 05/20/18 0418 05/21/18 0359  WBC 8.6 6.1 5.1  HGB 16.1 13.5 12.7*  HCT 46.7 41.5 39.5  PLT 251 211 205   BMET Recent Labs    05/20/18 0418 05/21/18 0359 05/22/18 0331  NA 141 140 142  K 3.7 3.5 3.3*  CL 108 108 109  CO2 22 25 23   GLUCOSE 143* 127* 106*  BUN 8 7 7   CREATININE 0.70 0.72 0.67  CALCIUM 8.4* 8.3* 8.5*   LFT Recent Labs    05/20/18 0418  05/22/18 0331  PROT 6.6   < > 6.3*  ALBUMIN 3.4*   < > 3.4*  AST 15   < > 16  ALT 14*   < > 12*  ALKPHOS 224*   < > 204*  BILITOT 0.9   < > 0.8  BILIDIR 0.1  --   --   IBILI 0.8  --   --    < > = values in this interval not displayed.   PT/INR No results for input(s): LABPROT, INR in the last 72 hours. Hepatitis Panel No results for input(s): HEPBSAG, HCVAB, HEPAIGM, HEPBIGM in the last 72 hours. C-Diff No results for input(s): CDIFFTOX in the last 72 hours. Fecal Lactopherrin No results for input(s): FECLLACTOFRN in the last 72 hours.  Studies/Results: Ct Abdomen W Contrast  Result Date: 05/20/2018 CLINICAL DATA:  New onset nausea and vomiting. EXAM: CT ABDOMEN WITH CONTRAST TECHNIQUE: Multidetector CT imaging of the abdomen was performed using the standard protocol following bolus administration of intravenous contrast. CONTRAST:  138mL ISOVUE-300 IOPAMIDOL (ISOVUE-300) INJECTION 61% COMPARISON:  CT scan 05/15/2018  FINDINGS: Lower chest: Stable pulmonary nodules since the recent CT scan. No acute findings at the lung bases. No pleural effusions. Hepatobiliary: Stable hepatic metastatic disease. No acute hepatic findings. Possible small gallstone dependently in the gallbladder. No CT findings to suggest acute cholecystitis. No common bile duct dilatation. Pancreas: No mass, inflammation or ductal dilatation. Spleen: Normal size.  No focal lesions. Adrenals/Urinary Tract: The adrenal glands and kidneys are unremarkable and stable. Stomach/Bowel: The stomach, duodenum, visualized small bowel and visualize colon are grossly normal. Stable appearance of the colonic anastomosis. Stable omental implant on image number 28. Vascular/Lymphatic: The aorta and branch vessels are stable. Scattered atherosclerotic calcifications. The major venous structures are patent. Small scattered mesenteric and retroperitoneal lymph nodes. Other: No ascites or abdominal wall hernia. Musculoskeletal: No significant bony findings. IMPRESSION: Stable CT appearance of the abdomen when compared to recent prior study. No new/acute findings. Suspect small gallstone or possible polyp in the gallbladder. No CT findings for acute cholecystitis. No biliary dilatation. Electronically Signed   By: Marijo Sanes M.D.   On: 05/20/2018 16:19   US Abdomen Limited Ruq  Result Date: 05/21/2018 CLINICAL DATA:  Gallstone or polyp noted on recent CT. Metastatic colon cancer. EXAM: ULTRASOUND ABDOMEN LIMITED RIGHT UPPER QUADRANT COMPARISON:  CT from previous day FINDINGS: Gallbladder: No  gallstones or wall thickening visualized. No polyp identified. No sonographic Murphy sign noted by sonographer. Common bile duct: Diameter: 5.1 mm, unremarkable Liver: 2 hypoechoic right lobe lesions 1 measuring 2.7 x 2.5 cm, the other 3.8 x 3.7 x 2.7 cm. Background parenchyma unremarkable. No biliary ductal dilatation. Portal vein is patent on color Doppler imaging with normal direction  of blood flow towards the liver. IMPRESSION: 1. Normal gallbladder. 2. Liver metastases as previously demonstrated on CT. Electronically Signed   By: Lucrezia Europe M.D.   On: 05/21/2018 17:31    Medications:  Scheduled: . aspirin EC  81 mg Oral Daily  . enoxaparin (LOVENOX) injection  40 mg Subcutaneous Daily  . feeding supplement  1 Container Oral BID BM  . insulin aspart  0-9 Units Subcutaneous TID WC  . insulin glargine  10 Units Subcutaneous QHS  . lisinopril  2.5 mg Oral Daily  . pantoprazole  40 mg Oral Daily  . potassium chloride  40 mEq Oral Once  . tamsulosin  0.4 mg Oral Daily   Continuous: . sodium chloride 1,000 mL (05/22/18 0313)  . famotidine (PEPCID) IV      Assessment/Plan: 1) Persistent nausea and vomiting. 2) Pancreatitis - ? Etiology. 3) Stage IV colon cancer.   The RUQ U/S was negative for any stones.  Overall his AP has trended downwards.  There is progression of mets in his liver, which could be the source of the elevated AP.  The source of his pancreatitis is unclear, however, this diagnosis is with his lipase and some symptoms consistent with pancreatitis.  CT scan was performed before adequate hydration and it was read as no pancreatitis.  A biliary etiology seems less likely at this time.  Plan: 1) EGD today. 2) Start IV Pepcid. 3) He will try to take pantoprazole orally.  (There is a Producer, television/film/video of IV pantoprazole. 4) He will need to have TPN if the EGD is unrevealing for any correctable causes of his symptoms.  LOS: 1 day   Tarhonda Hollenberg D 05/22/2018, 9:09 AM

## 2018-05-22 NOTE — Progress Notes (Signed)
Patient ID: Allen Glenn, male   DOB: 04-27-1956, 62 y.o.   MRN: [id number]       Subjective: Patient still with nausea and some emesis this morning.  Feels run down.  Objective: Vital signs in last 24 hours: Temp:  [98.4 F (36.9 C)-98.8 F (37.1 C)] 98.4 F (36.9 C) (05/30 0443) Pulse Rate:  [73-76] 73 (05/30 0443) Resp:  [18-20] 20 (05/30 0443) BP: (151-156)/(62-82) 156/82 (05/30 0443) SpO2:  [98 %-99 %] 98 % (05/30 0443) Last BM Date: 05/21/18  Intake/Output from previous day: No intake/output data recorded. Intake/Output this shift: No intake/output data recorded.  PE: Heart: regular Lungs: CTAB Abd: soft, NT, ND, +BS  Lab Results:  Recent Labs    05/20/18 0418 05/21/18 0359  WBC 6.1 5.1  HGB 13.5 12.7*  HCT 41.5 39.5  PLT 211 205   BMET Recent Labs    05/21/18 0359 05/22/18 0331  NA 140 142  K 3.5 3.3*  CL 108 109  CO2 25 23  GLUCOSE 127* 106*  BUN 7 7  CREATININE 0.72 0.67  CALCIUM 8.3* 8.5*   PT/INR No results for input(s): LABPROT, INR in the last 72 hours. CMP     Component Value Date/Time   NA 142 05/22/2018 0331   NA 137 11/19/2017 0822   K 3.3 (L) 05/22/2018 0331   K 4.2 11/19/2017 0822   CL 109 05/22/2018 0331   CO2 23 05/22/2018 0331   CO2 24 11/19/2017 0822   GLUCOSE 106 (H) 05/22/2018 0331   GLUCOSE 189 (H) 11/19/2017 0822   BUN 7 05/22/2018 0331   BUN 13.0 11/19/2017 0822   CREATININE 0.67 05/22/2018 0331   CREATININE 0.8 11/19/2017 0822   CALCIUM 8.5 (L) 05/22/2018 0331   CALCIUM 8.9 11/19/2017 0822   PROT 6.3 (L) 05/22/2018 0331   PROT 7.1 11/19/2017 0822   ALBUMIN 3.4 (L) 05/22/2018 0331   ALBUMIN 4.1 11/19/2017 0822   AST 16 05/22/2018 0331   AST 34 11/19/2017 0822   ALT 12 (L) 05/22/2018 0331   ALT 58 (H) 11/19/2017 0822   ALKPHOS 204 (H) 05/22/2018 0331   ALKPHOS 94 11/19/2017 0822   BILITOT 0.8 05/22/2018 0331   BILITOT 0.42 11/19/2017 0822   GFRNONAA >60 05/22/2018 0331   GFRAA >60 05/22/2018 0331   Lipase       Component Value Date/Time   LIPASE 119 (H) 05/22/2018 0331       Studies/Results: Ct Abdomen W Contrast  Result Date: 05/20/2018 CLINICAL DATA:  New onset nausea and vomiting. EXAM: CT ABDOMEN WITH CONTRAST TECHNIQUE: Multidetector CT imaging of the abdomen was performed using the standard protocol following bolus administration of intravenous contrast. CONTRAST:  112mL ISOVUE-300 IOPAMIDOL (ISOVUE-300) INJECTION 61% COMPARISON:  CT scan 05/15/2018 FINDINGS: Lower chest: Stable pulmonary nodules since the recent CT scan. No acute findings at the lung bases. No pleural effusions. Hepatobiliary: Stable hepatic metastatic disease. No acute hepatic findings. Possible small gallstone dependently in the gallbladder. No CT findings to suggest acute cholecystitis. No common bile duct dilatation. Pancreas: No mass, inflammation or ductal dilatation. Spleen: Normal size.  No focal lesions. Adrenals/Urinary Tract: The adrenal glands and kidneys are unremarkable and stable. Stomach/Bowel: The stomach, duodenum, visualized small bowel and visualize colon are grossly normal. Stable appearance of the colonic anastomosis. Stable omental implant on image number 28. Vascular/Lymphatic: The aorta and branch vessels are stable. Scattered atherosclerotic calcifications. The major venous structures are patent. Small scattered mesenteric and retroperitoneal lymph nodes. Other: No ascites  or abdominal wall hernia. Musculoskeletal: No significant bony findings. IMPRESSION: Stable CT appearance of the abdomen when compared to recent prior study. No new/acute findings. Suspect small gallstone or possible polyp in the gallbladder. No CT findings for acute cholecystitis. No biliary dilatation. Electronically Signed   By: Marijo Sanes M.D.   On: 05/20/2018 16:19   US Abdomen Limited Ruq  Result Date: 05/21/2018 CLINICAL DATA:  Gallstone or polyp noted on recent CT. Metastatic colon cancer. EXAM: ULTRASOUND ABDOMEN LIMITED  RIGHT UPPER QUADRANT COMPARISON:  CT from previous day FINDINGS: Gallbladder: No gallstones or wall thickening visualized. No polyp identified. No sonographic Murphy sign noted by sonographer. Common bile duct: Diameter: 5.1 mm, unremarkable Liver: 2 hypoechoic right lobe lesions 1 measuring 2.7 x 2.5 cm, the other 3.8 x 3.7 x 2.7 cm. Background parenchyma unremarkable. No biliary ductal dilatation. Portal vein is patent on color Doppler imaging with normal direction of blood flow towards the liver. IMPRESSION: 1. Normal gallbladder. 2. Liver metastases as previously demonstrated on CT. Electronically Signed   By: Lucrezia Europe M.D.   On: 05/21/2018 17:31    Anti-infectives: Anti-infectives (From admission, onward)   None       Assessment/Plan Pancreatitis -US reveals no gallstones or polyp as indicated on CT scan.  -do not feel his gb is the source of his pancreatitis. -will order HIDA scan to rule out dyskinesia as source of his nausea, although, still suspect that is secondary to his pancreatitis and dyskinesia certainly does not cause pancreatitis. -wife states he used to have elevated triglycerides, over 300.  These have come down though with low dose lipitor.  Lipid panel pending though to rule this out as a possibility. -could potentially have idiopathic pancreatitis?? -lipase and alkphos are waxing and waning it seems each day.  Unclear why this is the case.  Metastatic colonic adenoca  -per [name]  FEN - NPO for HIDA scan VTE - SCDs/Lovenox ID - none   LOS: 1 day    Henreitta Cea , [facility] 05/22/2018, 8:10 AM Pager: [phone]

## 2018-05-23 ENCOUNTER — Inpatient Hospital Stay (HOSPITAL_COMMUNITY): Payer: 59

## 2018-05-23 ENCOUNTER — Inpatient Hospital Stay: Payer: 59 | Admitting: Nurse Practitioner

## 2018-05-23 ENCOUNTER — Inpatient Hospital Stay: Payer: Self-pay

## 2018-05-23 ENCOUNTER — Encounter (HOSPITAL_COMMUNITY): Payer: Self-pay | Admitting: Radiology

## 2018-05-23 DIAGNOSIS — C7931 Secondary malignant neoplasm of brain: Principal | ICD-10-CM

## 2018-05-23 LAB — GLUCOSE, CAPILLARY
GLUCOSE-CAPILLARY: 156 mg/dL — AB (ref 65–99)
Glucose-Capillary: 86 mg/dL (ref 65–99)
Glucose-Capillary: 97 mg/dL (ref 65–99)
Glucose-Capillary: 229 mg/dL — ABNORMAL HIGH (ref 65–99)

## 2018-05-23 LAB — COMPREHENSIVE METABOLIC PANEL
ALT: 14 U/L — ABNORMAL LOW (ref 17–63)
AST: 15 U/L (ref 15–41)
Albumin: 3.4 g/dL — ABNORMAL LOW (ref 3.5–5.0)
Alkaline Phosphatase: 190 U/L — ABNORMAL HIGH (ref 38–126)
Anion gap: 10 (ref 5–15)
BUN: 7 mg/dL (ref 6–20)
CO2: 24 mmol/L (ref 22–32)
Calcium: 8.5 mg/dL — ABNORMAL LOW (ref 8.9–10.3)
Chloride: 107 mmol/L (ref 101–111)
Creatinine, Ser: 0.65 mg/dL (ref 0.61–1.24)
GFR calc Af Amer: >60 mL/min (ref >60)
GFR calc non Af Amer: >60 mL/min (ref >60)
Glucose, Bld: 94 mg/dL (ref 65–99)
Potassium: 3.5 mmol/L (ref 3.5–5.1)
Sodium: 141 mmol/L (ref 135–145)
Total Bilirubin: 1 mg/dL (ref 0.3–1.2)
Total Protein: 6.3 g/dL — ABNORMAL LOW (ref 6.5–8.1)

## 2018-05-23 LAB — LIPASE, BLOOD: Lipase: 56 U/L — ABNORMAL HIGH (ref 11–51)

## 2018-05-23 LAB — PHOSPHORUS: PHOSPHORUS: 4 mg/dL (ref 2.5–4.6)

## 2018-05-23 LAB — CORTISOL-AM, BLOOD: Cortisol - AM: 6.9 ug/dL (ref 6.7–22.6)

## 2018-05-23 LAB — MAGNESIUM: Magnesium: 2.2 mg/dL (ref 1.7–2.4)

## 2018-05-23 MED ORDER — IOHEXOL 300 MG/ML  SOLN
75.0000 mL | Freq: Once | INTRAMUSCULAR | Status: AC | PRN
  Administered 2018-05-23: 75 mL via INTRAVENOUS

## 2018-05-23 MED ORDER — TECHNETIUM TC 99M MEBROFENIN IV KIT
4.9000 | PACK | Freq: Once | INTRAVENOUS | Status: AC | PRN
  Administered 2018-05-23: 4.9 via INTRAVENOUS

## 2018-05-23 MED ORDER — DEXAMETHASONE SODIUM PHOSPHATE 4 MG/ML IJ SOLN
6.0000 mg | Freq: Four times a day (QID) | INTRAMUSCULAR | Status: DC
  Administered 2018-05-23 – 2018-05-26 (×10): 6 mg via INTRAVENOUS
  Filled 2018-05-23 (×11): qty 2

## 2018-05-23 MED ORDER — DEXAMETHASONE SODIUM PHOSPHATE 4 MG/ML IJ SOLN
10.0000 mg | Freq: Four times a day (QID) | INTRAMUSCULAR | Status: DC
  Administered 2018-05-23: 10 mg via INTRAVENOUS
  Filled 2018-05-23: qty 3

## 2018-05-23 NOTE — Progress Notes (Signed)
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  This is likely the source of his currently systems.  HIDA scan has been discontinued.  No further abdominal work up or surgical intervention.  We will sign off.  Allen Glenn 12:03 PM 05/23/2018

## 2018-05-23 NOTE — Progress Notes (Addendum)
Nutrition Follow-up  DOCUMENTATION CODES:   (Will assess for malnutrition at follow-up)  INTERVENTION:  - Will monitor for nutrition-related plan.  - ADDENDUM: spoke with Dr. Benay Glenn who reports plan to cancel TPN and PICC line order  NUTRITION DIAGNOSIS:   Increased nutrient needs related to chronic illness, cancer and cancer related treatments, acute illness(pancreatitis) as evidenced by estimated needs. -ongoing  GOAL:   Patient will meet greater than or equal to 90% of their needs -unmet/unable to meet.   MONITOR:   Weight trends, Labs, I & O's, Other (Comment)(TPN initiation)  REASON FOR ASSESSMENT:   Consult New TPN/TNA  ASSESSMENT:   62 y.o. male with history of metastatic colon cancer with slow progression being followed by Dr. Learta Glenn, oncologist, CAD status post stenting presents to the ER admits in [facility] with complaints of persistent nausea vomiting.  Patient was diagnosed with pancreatitis with elevated lipase by patient's primary care physician last week.  Also had a subsequent CAT scan done which showed progression of his metastasis.  Since then patient has been having progressive nausea vomiting and weakness.   Weight now back to weight reported to wife on the day PTA. Diet was advanced from NPO to CLD yesterday at 2:36 PM and then from CLD to [place] yesterday at 2:45 PM. Per Allen Glenn's, Surgery PA, note this AM, pt reported have a little bit for dinner last night followed by nausea and a small amount of emesis. Pt had returned from brain CT at a few minutes prior to RD visit; his wife was not in the room at the time of visit.   Pt was feeling very nauseated and although he was very appreciative of RD visit and involvement, he was not feeling well enough to have a prolonged conversation. Informed pt of plan for TPN (IV nutrition) initiation. He does not currently have access. Pt requests Dr. Gearldine Glenn input prior to "adding anything else on." He goes on to request  that TPN not be started until Dr. Benay Glenn determines a comprehensive plan following the results of brain CT.   Per notes, pt does not have any evidence of gallstones on ultrasound. Possible plan for HIDA scan to r/o biliary dyskinesia is on hold d/t results of preliminary head CT should a mass near the cerebellum. Possible plan for gastric emptying study to r/o gastroparesis 2/2 DM and cancer.   Pt had EGD yesterday and findings were: - mildly severe esophagitis - hiatal hernia - normal stomach, biopsy taken - duodenitis   Medications reviewed; 20 mg IV Pepcid BID, sliding scale Novolog, 10 units Lantus/day, 40 mg oral Protonix/day. Labs reviewed; CBG: 86 mg/dL, Cas: 8.5 mg/dL, Alk Phos elevated but trending down, lipase down to 56 units/L, triglycerides 159 mg/dL.   IVF: NS @ 125 mL/hr.    Diet Order:   Diet Order           Diet NPO time specified Except for: Sips with Meds  Diet effective midnight          EDUCATION NEEDS:   No education needs have been identified at this time  Skin:  Skin Assessment: Reviewed RN Assessment  Last BM:  5/29  Height:   Ht Readings from Last 1 Encounters:  05/19/18 '6\' 2"'$  (1.88 m)    Weight:   Wt Readings from Last 1 Encounters:  05/23/18 180 lb 9.6 oz (81.9 kg)    Ideal Body Weight:  86.36 kg  BMI:  Body mass index is 23.19 kg/m.  Estimated Nutritional  Needs:   Kcal:  2505-2755 (30-33 kcal/kg)  Protein:  125-140 grams (1.5 grams/kg)  Fluid:  >/= 2.5 L/day      Allen Matin, MS, RD, LDN, [facility] Inpatient Clinical Dietitian Pager # [phone] After hours/weekend pager # [phone]

## 2018-05-23 NOTE — Progress Notes (Signed)
Subjective: Overall no significant change.  Objective: Vital signs in last 24 hours: Temp:  [97.7 F (36.5 C)-98.8 F (37.1 C)] 98.1 F (36.7 C) (05/31 0640) Pulse Rate:  [64-76] 76 (05/31 0640) Resp:  [16-23] 20 (05/31 0640) BP: (136-175)/(74-82) 155/79 (05/31 0640) SpO2:  [97 %-100 %] 99 % (05/31 0640) Last BM Date: 05/21/18  Intake/Output from previous day: 05/30 0701 - 05/31 0700 In: 3565.6 [P.O.:50; I.V.:3015.6; IV Piggyback:100] Out: 400 [Urine:400] Intake/Output this shift: No intake/output data recorded.  General appearance: alert and no distress GI: soft, non-tender; bowel sounds normal; no masses,  no organomegaly  Lab Results: Recent Labs    05/21/18 0359  WBC 5.1  HGB 12.7*  HCT 39.5  PLT 205   BMET Recent Labs    05/21/18 0359 05/22/18 0331 05/23/18 0324  NA 140 142 141  K 3.5 3.3* 3.5  CL 108 109 107  CO2 25 23 24   GLUCOSE 127* 106* 94  BUN 7 7 7   CREATININE 0.72 0.67 0.65  CALCIUM 8.3* 8.5* 8.5*   LFT Recent Labs    05/23/18 0324  PROT 6.3*  ALBUMIN 3.4*  AST 15  ALT 14*  ALKPHOS 190*  BILITOT 1.0   PT/INR No results for input(s): LABPROT, INR in the last 72 hours. Hepatitis Panel No results for input(s): HEPBSAG, HCVAB, HEPAIGM, HEPBIGM in the last 72 hours. C-Diff No results for input(s): CDIFFTOX in the last 72 hours. Fecal Lactopherrin No results for input(s): FECLLACTOFRN in the last 72 hours.  Studies/Results: [name]  Result Date: 05/23/2018 If [employer] not attached, placement could not be confirmed due to current cardiac rhythm.  US Abdomen Limited Ruq  Result Date: 05/21/2018 CLINICAL DATA:  Gallstone or polyp noted on recent CT. Metastatic colon cancer. EXAM: ULTRASOUND ABDOMEN LIMITED RIGHT UPPER QUADRANT COMPARISON:  CT from previous day FINDINGS: Gallbladder: No gallstones or wall thickening visualized. No polyp identified. No sonographic Murphy sign noted by sonographer. Common bile duct:  Diameter: 5.1 mm, unremarkable Liver: 2 hypoechoic right lobe lesions 1 measuring 2.7 x 2.5 cm, the other 3.8 x 3.7 x 2.7 cm. Background parenchyma unremarkable. No biliary ductal dilatation. Portal vein is patent on color Doppler imaging with normal direction of blood flow towards the liver. IMPRESSION: 1. Normal gallbladder. 2. Liver metastases as previously demonstrated on CT. Electronically Signed   By: Lucrezia Europe M.D.   On: 05/21/2018 17:31    Medications:  Scheduled: . aspirin EC  81 mg Oral Daily  . enoxaparin (LOVENOX) injection  40 mg Subcutaneous Daily  . feeding supplement  1 Container Oral BID BM  . insulin aspart  0-9 Units Subcutaneous TID WC  . insulin glargine  10 Units Subcutaneous QHS  . lisinopril  2.5 mg Oral Daily  . pantoprazole  40 mg Oral Daily  . tamsulosin  0.4 mg Oral Daily   Continuous: . sodium chloride 1,000 mL (05/23/18 0614)  . famotidine (PEPCID) IV Stopped (05/22/18 2126)    Assessment/Plan: 1) Persistent nausea and vomiting. 2) Minimal esophagitis.   The source of the patient's N/V it not clear.  There were some times of improvement yesterday, but overall there does not appear to be a change.  Phenergan is still helping, but it causes him drowsiness.  Plan: 1) Continue with IV Pepcid and PO pantoprazole. 2) Await HIDA scan. 3) Initiate TPN. 4) If HIDA is negative, it may be reasonable to obtain a head CT to evaluate for any metastatic lesions. 5)  As mentioned by Dr. Johney Maine, a gastric emptying scan may also be necessary.  LOS: 2 days   Allen Glenn D 05/23/2018, 7:28 AM

## 2018-05-23 NOTE — Progress Notes (Signed)
Patient ID: Allen Glenn, male   DOB: [birth year], 62 y.o.   MRN: [id number]    1 Day Post-Op  Subjective: Patient ate a little last night for dinner, but then had some nausea and small amount of emesis.  Woke up nauseated still this morning.  Denies abdominal pain.  Objective: Vital signs in last 24 hours: Temp:  [97.7 F (36.5 C)-98.8 F (37.1 C)] 98.1 F (36.7 C) (05/31 0640) Pulse Rate:  [64-76] 76 (05/31 0640) Resp:  [16-23] 20 (05/31 0640) BP: (136-175)/(74-82) 155/79 (05/31 0640) SpO2:  [97 %-100 %] 99 % (05/31 0640) Last BM Date: 05/21/18  Intake/Output from previous day: 05/30 0701 - 05/31 0700 In: 3565.6 [P.O.:50; I.V.:3015.6; IV Piggyback:100] Out: 400 [Urine:400] Intake/Output this shift: No intake/output data recorded.  PE: Heart: regular Lungs: CTAB Abd: soft, NT, +BS, ND  Lab Results:  Recent Labs    05/21/18 0359  WBC 5.1  HGB 12.7*  HCT 39.5  PLT 205   BMET Recent Labs    05/22/18 0331 05/23/18 0324  NA 142 141  K 3.3* 3.5  CL 109 107  CO2 23 24  GLUCOSE 106* 94  BUN 7 7  CREATININE 0.67 0.65  CALCIUM 8.5* 8.5*   PT/INR No results for input(s): LABPROT, INR in the last 72 hours. CMP     Component Value Date/Time   NA 141 05/23/2018 0324   NA 137 11/19/2017 0822   K 3.5 05/23/2018 0324   K 4.2 11/19/2017 0822   CL 107 05/23/2018 0324   CO2 24 05/23/2018 0324   CO2 24 11/19/2017 0822   GLUCOSE 94 05/23/2018 0324   GLUCOSE 189 (H) 11/19/2017 0822   BUN 7 05/23/2018 0324   BUN 13.0 11/19/2017 0822   CREATININE 0.65 05/23/2018 0324   CREATININE 0.8 11/19/2017 0822   CALCIUM 8.5 (L) 05/23/2018 0324   CALCIUM 8.9 11/19/2017 0822   PROT 6.3 (L) 05/23/2018 0324   PROT 7.1 11/19/2017 0822   ALBUMIN 3.4 (L) 05/23/2018 0324   ALBUMIN 4.1 11/19/2017 0822   AST 15 05/23/2018 0324   AST 34 11/19/2017 0822   ALT 14 (L) 05/23/2018 0324   ALT 58 (H) 11/19/2017 0822   ALKPHOS 190 (H) 05/23/2018 0324   ALKPHOS 94 11/19/2017 0822   BILITOT  1.0 05/23/2018 0324   BILITOT 0.42 11/19/2017 0822   GFRNONAA >60 05/23/2018 0324   GFRAA >60 05/23/2018 0324   Lipase     Component Value Date/Time   LIPASE 56 (H) 05/23/2018 0324       Studies/Results: [name] [employer] Site Rite  Result Date: 05/23/2018 If Site Rite image not attached, placement could not be confirmed due to current cardiac rhythm.  US Abdomen Limited Ruq  Result Date: 05/21/2018 CLINICAL DATA:  Gallstone or polyp noted on recent CT. Metastatic colon cancer. EXAM: ULTRASOUND ABDOMEN LIMITED RIGHT UPPER QUADRANT COMPARISON:  CT from previous day FINDINGS: Gallbladder: No gallstones or wall thickening visualized. No polyp identified. No sonographic Murphy sign noted by sonographer. Common bile duct: Diameter: 5.1 mm, unremarkable Liver: 2 hypoechoic right lobe lesions 1 measuring 2.7 x 2.5 cm, the other 3.8 x 3.7 x 2.7 cm. Background parenchyma unremarkable. No biliary ductal dilatation. Portal vein is patent on color Doppler imaging with normal direction of blood flow towards the liver. IMPRESSION: 1. Normal gallbladder. 2. Liver metastases as previously demonstrated on CT. Electronically Signed   By: Lucrezia Europe M.D.   On: 05/21/2018 17:31    Anti-infectives: Anti-infectives (From admission, onward)  None       Assessment/Plan Pancreatitis -US reveals no gallstones or polyp as indicated on CT scan.  -do not feel his gb is the source of his pancreatitis. -HIDA scan to rule out dyskinesia as source of his nausea, although dyskinesia certainly does not cause pancreatitis. -triglycerides only 170s range -patient clinically doesn't act like pancreatitis.  Unsure cause of his persistent nausea -lipase has essentially normalized today -check CEA level today to see where this is in relation to his recent baselines. -gastric emptying study may be appropriate  Metastatic colonic adenoca  -per [name] -check cea level to see if this is trending up.  Could have more met  disease than scan shows?    FEN - NPO for HIDA scan VTE - SCDs/Lovenox ID - none     LOS: 2 days    Henreitta Cea , [facility] 05/23/2018, 7:48 AM Pager: [phone]

## 2018-05-23 NOTE — Progress Notes (Addendum)
IP PROGRESS NOTE  Subjective:   Allen Glenn continues to have nausea and vomiting when he eats.  He also reports nausea when he ambulates.  He has mild balance difficulty.  No pain.   Objective: Vital signs in last 24 hours: Blood pressure (!) 155/79, pulse 76, temperature 98.1 F (36.7 C), temperature source Oral, resp. rate 20, height _0  (1.88 m), weight 180 lb 9.6 oz (81.9 kg), SpO2 99 %.  Intake/Output from previous day: 05/30 0701 - 05/31 0700 In: 3565.6 [P.O.:50; I.V.:3015.6; IV Piggyback:100] Out: 400 [Urine:400]  Physical Exam:   Lungs: Clear anteriorly Cardiac: Regular rate and rhythm Abdomen: Soft and nontender, no hepatosplenomegaly Extremities: No leg edema Neurologic: Finger-to-nose testing is normal    Lab Results: Recent Labs    05/21/18 0359  WBC 5.1  HGB 12.7*  HCT 39.5  PLT 205    BMET Recent Labs    05/22/18 0331 05/23/18 0324  NA 142 141  K 3.3* 3.5  CL 109 107  CO2 23 24  GLUCOSE 106* 94  BUN 7 7  CREATININE 0.67 0.65  CALCIUM 8.5* 8.5*   05/23/2018: Alkaline phosphatase 190, lipase 56, bilirubin 1.0  Lab Results  Component Value Date   CEA1 9.40 (H) 02/24/2018     Medications: I have reviewed the patient's current medications.  Assessment/plan: 1. 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 No BRAFmutation  Liver biopsy 05/04/2015-metastatic adenocarcinoma consistent with a colon primary ? Staging PET scan 06/08/2015-isolated segment 4A liver lesion ? Initiation of adjuvant FOLFOX 06/13/2015 ? Restaging CT 08/09/2015 revealed a slight decrease in a borderline ileocolic node, decrease in the hepatic dome metastasis, no new lesions ? Liver resection 09/28/2015-pathology consistent with metastatic colon cancer, negative margins ? Adjuvant FOLFOX resumed 11/08/2015, oxaliplatin eliminated  beginning 11/22/2015 secondary to neuropathy. He completed adjuvant chemotherapy 02/16/2016 ? Restaging chest CT 11/08/2016, compared to 05/07/2016 revealed a new 9 mm left upper lobe nodule, stable 2.2 cm right hepatic lesion ? PET scan 11/19/2016 confirmed a hypermetabolic left upper lobe nodule, hypermetabolic left paratracheal and pericardiac lymph nodes, and hypermetabolism associated with the hypoattenuating lesion in the dome of the liver ? Status post EBUSbiopsies of the left lingula nodule and a level 4Lnode on 11/28/2016-no evidence of malignancy ? CT chest 02/11/2017-increase in size of the left pulmonary nodule and epicardial lymph node ? Status post SBRT 2 the left lung nodule and mediastinum completed 03/14/2017 ? CTs 07/01/2017-new 9 mm focus along the right liver capsule, stable left subcapsular liver lesion, radiation change at site of left upper lobe nodule, stable pericardiallymph node ? CTs 11/25/2017-new 5 mm lingular nodule, enlargement of capsular-based right liver lesion, new capsular based right liver lesion capsular lesion at the hepatic dome ? CTs 02/25/2018- enlargement of small lung nodules, liver lesions and small mesenteric lymph node ? CT abdomen/pelvis 05/15/2018- new lower lobe pulmonary nodules, enlargement of liver lesions and right lower quadrant soft tissue nodules 2. Coronary artery disease status post a myocardial infarction in 2014  3. Hypertension  4. Hyperlipidemia  5.    Diabetes  6.    Admission 05/20/2018 with nausea/vomiting   Allen Glenn continues to have nausea and vomiting of unclear etiology.  An extensive diagnostic work-up including repeat abdominal CTs, a gallbladder ultrasound, and upper endoscopy have not revealed a cause for the nausea.  I doubt the nausea is related to the liver or peritoneal metastases.  I think we should obtain a brain CT today.  Recommendations: 1.  Continue supportive care as recommended by Dr. Benson Norway 2.  CT  brain 3.  Please call Oncology as needed over the weekend.  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  I discussed the CT findings with Allen Glenn and his wife.  He will begin Decadron.  I discussed the case with Dr. Vertell Limber from neurosurgery.  He will plan to see Allen Glenn on 05/26/2018.  Dr. Lisbeth Renshaw will see him today.  The preliminary plan is to proceed with a brain MRI to be followed by SBRT and then resection of the cerebellar mass.  Hopefully the nausea/vomiting will improve with Decadron and he will not require TNA.  Recommendations: 1.  Decadron 10 mg IV, then 6 mg IV every 6 hours 2.  Radiation oncology consult 3.  Neurosurgery consult-Dr. Vertell Limber to see him on 05/26/2018, call on call neurosurgeon if neurosurgical input is needed sooner  Please call Oncology as needed over the weekend.

## 2018-05-23 NOTE — Progress Notes (Signed)
PROGRESS NOTE    Allen Glenn  RWE:[id number] DOB: 06-19-1956 DOA: 05/19/2018 PCP: [name], MD   Brief Narrative:  HPI On 05/20/2018 by Dr. Lu Duffel is a 62 y.o. male with history of metastatic colon cancer with slow progression being followed by Dr. [name], oncologist, CAD status post stenting presents to the ER admits in [facility] with complaints of persistent nausea vomiting.  Patient was diagnosed with pancreatitis with elevated lipase by patient's primary care physician last week.  Also had a subsequent CAT scan done which showed progression of his metastasis.  Since then patient has been having progressive nausea vomiting and weakness.  Presents to the ER admits in [facility].  Denies any chest pain or shortness of breath fever or chills.  Interim history  Assessment & Plan   Persistent Nausea, vomiting -CT scan on 05/20/2018 showed stable CT appearance of the abdomen compared to recent prior study.  No new/acute findings.  Suspect small gallstone or possible polyp in the gallbladder.  No findings of acute cholecystitis.  No biliary dilatation. -Lipase on admission 581, improved to 56 today -Gastroenterology, Dr. Benson Norway, consulted and appreciated, recommended surgical consult -General surgery consulted and appreciated.  Plan to obtain ultrasound to determine if patient has cholelithiasis.  Given patient's current metastatic disease as well as previous operations, surgical intervention may be very difficult. -RUQ Abd ultrasound: Gallbladder.  Liver metastasis as previously demonstrated on CT -Check triglycerides, 159 -S/p EGD -CT head obtained: 3.4 cm.  Left cerebellar metastasis with extensive edema.  Fourth ventricular narrowing without hydrocephalus. -Discussed with oncology, started on IV Decadron.  Plans to consult neurosurgery. -Patient was first thought to have pancreatitis given his elevated lipase on admission however work-up thus far has been negative  and no stones have been noted on imaging.  Diabetes mellitus, type II -Continue Lantus, insulin sliding scale, CBG monitoring -Patient will likely need adjustments made to sliding scale as well as Lantus as he will be on IV Decadron  Coronary artery disease -Stable, no complaints of chest pain -Continue aspirin  Essential hypertension -BP appears to be stable -Continue lisinopril  Metastatic colon cancer -Followed by [name], oncology -CT head as listed above, decadron started, and plan to consult neurosurgery  BPH -Continue Flomax  Hypokalemia -Resolved, continue to monitor BMP  DVT Prophylaxis  lovenox  Code Status: Full  Family Communication: Wife at bedside  Disposition Plan: Admitted, pending further recommendations from oncology.  Consultants Gastroenterology, Dr. Benson Norway General surgery Oncology  Procedures  RUQ Abd [name]  Antibiotics   Anti-infectives (From admission, onward)   None      Subjective:   Allen Glenn seen and examined today.  Continues to have nausea.  Denies current chest pain, shortness of breath, abdominal pain, diarrhea or constipation.  Objective:   Vitals:   05/22/18 1652 05/22/18 2109 05/23/18 0640 05/23/18 0816  BP: (!) 154/74 (!) 149/82 (!) 155/79   Pulse: 65 64 76   Resp: 18 20 20    Temp: 98.8 F (37.1 C) 98.3 F (36.8 C) 98.1 F (36.7 C)   TempSrc: Oral Oral Oral   SpO2: 100% 99% 99%   Weight:    81.9 kg (180 lb 9.6 oz)  Height:        Intake/Output Summary (Last 24 hours) at 05/23/2018 1319 Last data filed at 05/23/2018 0637 Gross per 24 hour  Intake 3565.6 ml  Output 400 ml  Net 3165.6 ml   Filed Weights   05/19/18 1812 05/19/18 2250  05/23/18 0816  Weight: 82.1 kg (181 lb) 83.5 kg (184 lb 1.4 oz) 81.9 kg (180 lb 9.6 oz)   Exam  General: Well developed, well nourished, NAD, appears stated age  HEENT: NCAT, Pmucous membranes moist.   Neck: Supple  Cardiovascular: S1 S2 auscultated, no rubs, murmurs or  gallops. Regular rate and rhythm.  Respiratory: Clear to auscultation bilaterally with equal chest rise  Abdomen: Soft, nontender, nondistended, + bowel sounds  Extremities: warm dry without cyanosis clubbing or edema  Neuro: AAOx3, nonfocal  Skin: Without rashes exudates or nodules  Psych: appropriate mood and affect, pleasant  Data Reviewed: I have personally reviewed following labs and imaging studies  CBC: Recent Labs  Lab 05/19/18 1821 05/20/18 0418 05/21/18 0359  WBC 8.6 6.1 5.1  NEUTROABS 6.4  --  3.1  HGB 16.1 13.5 12.7*  HCT 46.7 41.5 39.5  MCV 85.8 88.1 87.4  PLT 251 211 242   Basic Metabolic Panel: Recent Labs  Lab 05/19/18 1821 05/20/18 0418 05/21/18 0359 05/22/18 0331 05/23/18 0324  NA 137 141 140 142 141  K 4.4 3.7 3.5 3.3* 3.5  CL 102 108 108 109 107  CO2 25 22 25 23 24   GLUCOSE 173* 143* 127* 106* 94  BUN 10 8 7 7 7   CREATININE 0.88 0.70 0.72 0.67 0.65  CALCIUM 9.0 8.4* 8.3* 8.5* 8.5*  MG  --   --  2.3  --  2.2  PHOS  --   --  3.5  --  4.0   GFR: Estimated Creatinine Clearance: 112.3 mL/min (by C-G formula based on SCr of 0.65 mg/dL). Liver Function Tests: Recent Labs  Lab 05/19/18 1821 05/20/18 0418 05/21/18 0359 05/22/18 0331 05/23/18 0324  AST 22 15 15 16 15   ALT 17 14* 12* 12* 14*  ALKPHOS 299* 224* 192* 204* 190*  BILITOT 0.7 0.9 0.6 0.8 1.0  PROT 8.1 6.6 6.1* 6.3* 6.3*  ALBUMIN 4.2 3.4* 3.1* 3.4* 3.4*   Recent Labs  Lab 05/19/18 1821 05/20/18 0418 05/21/18 0359 05/22/18 0331 05/23/18 0324  LIPASE 581* 179* 272* 119* 56*   No results for input(s): AMMONIA in the last 168 hours. Coagulation Profile: No results for input(s): INR, PROTIME in the last 168 hours. Cardiac Enzymes: Recent Labs  Lab 05/19/18 1821  TROPONINI <0.03   BNP (last 3 results) No results for input(s): PROBNP in the last 8760 hours. HbA1C: No results for input(s): HGBA1C in the last 72 hours. CBG: Recent Labs  Lab 05/22/18 0739  05/22/18 1650 05/22/18 2106 05/23/18 0727 05/23/18 1145  GLUCAP 128* 91 111* 86 97   Lipid Profile: Recent Labs    05/22/18 0828  CHOL 180  HDL 33*  LDLCALC 115*  TRIG 159*  CHOLHDL 5.5   Thyroid Function Tests: No results for input(s): TSH, T4TOTAL, FREET4, T3FREE, THYROIDAB in the last 72 hours. Anemia Panel: No results for input(s): VITAMINB12, FOLATE, FERRITIN, TIBC, IRON, RETICCTPCT in the last 72 hours. Urine analysis:    Component Value Date/Time   COLORURINE YELLOW 05/19/2018 1825   APPEARANCEUR CLEAR 05/19/2018 1825   LABSPEC >1.030 (H) 05/19/2018 1825   PHURINE 6.0 05/19/2018 1825   GLUCOSEU NEGATIVE 05/19/2018 1825   HGBUR NEGATIVE 05/19/2018 1825   BILIRUBINUR NEGATIVE 05/19/2018 1825   KETONESUR 15 (A) 05/19/2018 1825   PROTEINUR NEGATIVE 05/19/2018 1825   NITRITE NEGATIVE 05/19/2018 1825   LEUKOCYTESUR NEGATIVE 05/19/2018 1825   Sepsis Labs: @LABRCNTIP (procalcitonin:4,lacticidven:4)  ) Recent Results (from the past 240 hour(s))  Gastrointestinal Panel  by PCR , Stool     Status: None   Collection Time: 05/21/18 11:09 AM  Result Value Ref Range Status   Campylobacter species NOT DETECTED NOT DETECTED Final   Plesimonas shigelloides NOT DETECTED NOT DETECTED Final   Salmonella species NOT DETECTED NOT DETECTED Final   Yersinia enterocolitica NOT DETECTED NOT DETECTED Final   Vibrio species NOT DETECTED NOT DETECTED Final   Vibrio cholerae NOT DETECTED NOT DETECTED Final   Enteroaggregative E coli (EAEC) NOT DETECTED NOT DETECTED Final   Enteropathogenic E coli (EPEC) NOT DETECTED NOT DETECTED Final   Enterotoxigenic E coli (ETEC) NOT DETECTED NOT DETECTED Final   Shiga like toxin producing E coli (STEC) NOT DETECTED NOT DETECTED Final   Shigella/Enteroinvasive E coli (EIEC) NOT DETECTED NOT DETECTED Final   Cryptosporidium NOT DETECTED NOT DETECTED Final   Cyclospora cayetanensis NOT DETECTED NOT DETECTED Final   Entamoeba histolytica NOT DETECTED  NOT DETECTED Final   Giardia lamblia NOT DETECTED NOT DETECTED Final   Adenovirus F40/41 NOT DETECTED NOT DETECTED Final   Astrovirus NOT DETECTED NOT DETECTED Final   Norovirus GI/GII NOT DETECTED NOT DETECTED Final   Rotavirus A NOT DETECTED NOT DETECTED Final   Sapovirus (I, II, IV, and V) NOT DETECTED NOT DETECTED Final    Comment: Performed at [facility], [address] [place], [place] [address]      Radiology Studies: Ct Head W & Wo Contrast  Result Date: 05/23/2018 CLINICAL DATA:  Metastatic colon cancer with intractable nausea. Weakness and dizziness for 2 weeks. EXAM: CT HEAD WITHOUT AND WITH CONTRAST TECHNIQUE: Contiguous axial images were obtained from the base of the skull through the vertex without and with intravenous contrast CONTRAST:  21mL OMNIPAQUE IOHEXOL 300 MG/ML  SOLN COMPARISON:  PET-CT 11/19/2016 FINDINGS: Brain: Enhancing mass in the lateral left cerebellum measuring 3.4 cm, with extensive vasogenic edema crossing midline and narrowing the fourth ventricle. No hydrocephalus. No evidence of infarct or hemorrhage. Chronic small vessel ischemic type change in the cerebral white matter. Vascular: Visible vessels are patent. Skull: No acute finding or evidence of metastasis. Sinuses/Orbits: Negative These results were called by telephone at the time of interpretation on 05/23/2018 at 10:23 am to Dr. Betsy Coder , who verbally acknowledged these results. 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 There is fourth ventricular narrowing without hydrocephalus. Electronically Signed   By: Monte Fantasia M.D.   On: 05/23/2018 10:23   [name] Ekg Site Rite  Result Date: 05/23/2018 If Site Rite image not attached, placement could not be confirmed due to current cardiac rhythm.  US Abdomen Limited Ruq  Result Date: 05/21/2018 CLINICAL DATA:  Gallstone or polyp noted on recent CT. Metastatic colon cancer. EXAM: ULTRASOUND ABDOMEN LIMITED  RIGHT UPPER QUADRANT COMPARISON:  CT from previous day FINDINGS: Gallbladder: No gallstones or wall thickening visualized. No polyp identified. No sonographic Murphy sign noted by sonographer. Common bile duct: Diameter: 5.1 mm, unremarkable Liver: 2 hypoechoic right lobe lesions 1 measuring 2.7 x 2.5 cm, the other 3.8 x 3.7 x 2.7 cm. Background parenchyma unremarkable. No biliary ductal dilatation. Portal vein is patent on color Doppler imaging with normal direction of blood flow towards the liver. IMPRESSION: 1. Normal gallbladder. 2. Liver metastases as previously demonstrated on CT. Electronically Signed   By: Lucrezia Europe M.D.   On: 05/21/2018 17:31     Scheduled Meds: . aspirin EC  81 mg Oral Daily  . dexamethasone  10 mg Intravenous Q6H  .  enoxaparin (LOVENOX) injection  40 mg Subcutaneous Daily  . feeding supplement  1 Container Oral BID BM  . insulin aspart  0-9 Units Subcutaneous TID WC  . insulin glargine  10 Units Subcutaneous QHS  . lisinopril  2.5 mg Oral Daily  . pantoprazole  40 mg Oral Daily  . tamsulosin  0.4 mg Oral Daily   Continuous Infusions: . sodium chloride 1,000 mL (05/23/18 0614)  . famotidine (PEPCID) IV Stopped (05/22/18 2126)     LOS: 2 days   Time Spent in minutes   30 minutes  Seabron Iannello D.O. on 05/23/2018 at 1:19 PM  Between 7am to 7pm - Pager - [phone]  After 7pm go to [email or web address] - password [email or web address]  And look for the night coverage person covering for me after hours  [facility] Office  [phone]

## 2018-05-24 DIAGNOSIS — R930 Abnormal findings on diagnostic imaging of skull and head, not elsewhere classified: Secondary | ICD-10-CM

## 2018-05-24 DIAGNOSIS — R748 Abnormal levels of other serum enzymes: Secondary | ICD-10-CM

## 2018-05-24 DIAGNOSIS — R112 Nausea with vomiting, unspecified: Secondary | ICD-10-CM

## 2018-05-24 LAB — GLUCOSE, CAPILLARY
GLUCOSE-CAPILLARY: 357 mg/dL — AB (ref 65–99)
GLUCOSE-CAPILLARY: 281 mg/dL — AB (ref 65–99)
Glucose-Capillary: 227 mg/dL — ABNORMAL HIGH (ref 65–99)
Glucose-Capillary: 312 mg/dL — ABNORMAL HIGH (ref 65–99)

## 2018-05-24 LAB — CEA: CEA: 20.3 ng/mL — ABNORMAL HIGH (ref 0.0–4.7)

## 2018-05-24 LAB — CANCER ANTIGEN 19-9: CAN 19-9: 33 U/mL (ref 0–35)

## 2018-05-24 MED ORDER — ONDANSETRON HCL 4 MG/2ML IJ SOLN
4.0000 mg | Freq: Four times a day (QID) | INTRAMUSCULAR | Status: DC
  Administered 2018-05-24 – 2018-05-26 (×8): 4 mg via INTRAVENOUS
  Filled 2018-05-24 (×8): qty 2

## 2018-05-24 MED ORDER — TROLAMINE SALICYLATE 10 % EX CREA
TOPICAL_CREAM | CUTANEOUS | Status: DC | PRN
  Filled 2018-05-24: qty 85

## 2018-05-24 MED ORDER — MUSCLE RUB 10-15 % EX CREA
TOPICAL_CREAM | CUTANEOUS | Status: DC | PRN
  Filled 2018-05-24: qty 85

## 2018-05-24 MED ORDER — ONDANSETRON HCL 4 MG PO TABS
4.0000 mg | ORAL_TABLET | Freq: Four times a day (QID) | ORAL | Status: DC
Start: 2018-05-24 — End: 2018-05-26
  Administered 2018-05-26: 4 mg via ORAL
  Filled 2018-05-24 (×3): qty 1

## 2018-05-24 MED ORDER — ZOLPIDEM TARTRATE 5 MG PO TABS
5.0000 mg | ORAL_TABLET | Freq: Every evening | ORAL | Status: DC | PRN
  Administered 2018-05-24 – 2018-05-25 (×2): 5 mg via ORAL
  Filled 2018-05-24 (×2): qty 1

## 2018-05-24 MED ORDER — METHOCARBAMOL 500 MG PO TABS
750.0000 mg | ORAL_TABLET | Freq: Three times a day (TID) | ORAL | Status: DC | PRN
  Administered 2018-05-24 – 2018-05-26 (×5): 750 mg via ORAL
  Filled 2018-05-24 (×5): qty 2

## 2018-05-24 NOTE — Progress Notes (Signed)
PROGRESS NOTE    Allen Glenn  EXH:[id number] DOB: 07-02-1956 DOA: 05/19/2018 PCP: [name], MD   Brief Narrative:  HPI On 05/20/2018 by Dr. Lu Duffel is a 62 y.o. male with history of metastatic colon cancer with slow progression being followed by Dr. [name], oncologist, CAD status post stenting presents to the ER admits in [facility] with complaints of persistent nausea vomiting.  Patient was diagnosed with pancreatitis with elevated lipase by patient's primary care physician last week.  Also had a subsequent CAT scan done which showed progression of his metastasis.  Since then patient has been having progressive nausea vomiting and weakness.  Presents to the ER admits in [facility]  Denies any chest pain or shortness of breath fever or chills.  Interim history Admitted for persistent nausea and vomiting.  Thought to have pancreatitis however this was ruled out as patient had no gallstones noted on imaging.  Lipase has improved.  CT head was obtained which did show metastasis which could be the cause of patient's persistent nausea and vomiting.  He has been started on IV Decadron.  Radiation oncology also consulted. Assessment & Plan   Persistent Nausea, vomiting -CT scan on 05/20/2018 showed stable CT appearance of the abdomen compared to recent prior study.  No new/acute findings.  Suspect small gallstone or possible polyp in the gallbladder.  No findings of acute cholecystitis.  No biliary dilatation. -Lipase on admission 581, improved to 56 today -Gastroenterology, Dr. Benson Norway, consulted and appreciated, recommended surgical consult -General surgery consulted and appreciated.  Plan to obtain ultrasound to determine if patient has cholelithiasis.  Given patient's current metastatic disease as well as previous operations, surgical intervention may be very difficult. -RUQ Abd ultrasound: Gallbladder.  Liver metastasis as previously demonstrated on CT -Check triglycerides,  159 -S/p EGD: Mild severe reflux otitis, 3 cm hiatal hernia, duodenitis.  Normal stomach, biopsied. -HIDA scan was ordered however patient could not tolerate contrast. -CT head obtained: 3.4 cm.  Left cerebellar metastasis with extensive edema.  Fourth ventricular narrowing without hydrocephalus. -Discussed with oncology, started on IV Decadron.  Plans to consult neurosurgery-official consult 05/26/2018. -Patient was first thought to have pancreatitis given his elevated lipase on admission however work-up thus far has been negative and no stones have been noted on imaging. -Patient placed on regular diet and was able to tolerate well. -Continue antiemetics as needed as well as scheduled.  Diabetes mellitus, type II -Continue Lantus, insulin sliding scale, CBG monitoring -CBGs have been uncontrolled, likely secondary to Decadron -Change insulin sliding scale to moderate  Coronary artery disease -Stable, no complaints of chest pain -Continue aspirin  Essential hypertension -BP appears to be stable -Continue lisinopril  Metastatic colon cancer -Followed by [name], oncology -CT head as listed above, decadron started, and plan to consult neurosurgery  BPH -Continue Flomax  Hypokalemia -Resolved, continue to monitor BMP  DVT Prophylaxis  lovenox  Code Status: Full  Family Communication: Family at bedside  Disposition Plan: Admitted, pending further recommendations from oncology/neurosurgery  Consultants Gastroenterology, Dr. Benson Norway General surgery Oncology  Procedures  RUQ Abd [name]  Antibiotics   Anti-infectives (From admission, onward)   None      Subjective:   Allen Glenn seen and examined today.  Feels nausea has improved.  Able to tolerate diet.  Denies current vomiting, abdominal pain, diarrhea or constipation, dizziness or headache, chest pain or shortness of breath.  Objective:   Vitals:   05/23/18 3810 05/23/18 1332 05/23/18 2139 05/24/18 1751  BP:  (!)  153/72 (!) 150/77 (!) 154/73  Pulse:  70 83 72  Resp:  16 15 16   Temp:  97.9 F (36.6 C) 98.9 F (37.2 C) 98 F (36.7 C)  TempSrc:  Oral Oral Oral  SpO2:  100% 97% 99%  Weight: 81.9 kg (180 lb 9.6 oz)     Height:        Intake/Output Summary (Last 24 hours) at 05/24/2018 1248 Last data filed at 05/24/2018 0957 Gross per 24 hour  Intake 720 ml  Output -  Net 720 ml   Filed Weights   05/19/18 1812 05/19/18 2250 05/23/18 0816  Weight: 82.1 kg (181 lb) 83.5 kg (184 lb 1.4 oz) 81.9 kg (180 lb 9.6 oz)   Exam  General: Well developed, well nourished, NAD, appears stated age  HEENT: NCAT, mucous membranes moist.   Neck: Supple  Cardiovascular: S1 S2 auscultated, no rubs, murmurs or gallops. Regular rate and rhythm.  Respiratory: Clear to auscultation bilaterally with equal chest rise  Abdomen: Soft, nontender, nondistended, + bowel sounds  Extremities: warm dry without cyanosis clubbing or edema  Neuro: AAOx3, nonfocal  Psych:, appropriate mood and affect  Data Reviewed: I have personally reviewed following labs and imaging studies  CBC: Recent Labs  Lab 05/19/18 1821 05/20/18 0418 05/21/18 0359  WBC 8.6 6.1 5.1  NEUTROABS 6.4  --  3.1  HGB 16.1 13.5 12.7*  HCT 46.7 41.5 39.5  MCV 85.8 88.1 87.4  PLT 251 211 366   Basic Metabolic Panel: Recent Labs  Lab 05/19/18 1821 05/20/18 0418 05/21/18 0359 05/22/18 0331 05/23/18 0324  NA 137 141 140 142 141  K 4.4 3.7 3.5 3.3* 3.5  CL 102 108 108 109 107  CO2 25 22 25 23 24   GLUCOSE 173* 143* 127* 106* 94  BUN 10 8 7 7 7   CREATININE 0.88 0.70 0.72 0.67 0.65  CALCIUM 9.0 8.4* 8.3* 8.5* 8.5*  MG  --   --  2.3  --  2.2  PHOS  --   --  3.5  --  4.0   GFR: Estimated Creatinine Clearance: 112.3 mL/min (by C-G formula based on SCr of 0.65 mg/dL). Liver Function Tests: Recent Labs  Lab 05/19/18 1821 05/20/18 0418 05/21/18 0359 05/22/18 0331 05/23/18 0324  AST 22 15 15 16 15   ALT 17 14* 12* 12* 14*  ALKPHOS  299* 224* 192* 204* 190*  BILITOT 0.7 0.9 0.6 0.8 1.0  PROT 8.1 6.6 6.1* 6.3* 6.3*  ALBUMIN 4.2 3.4* 3.1* 3.4* 3.4*   Recent Labs  Lab 05/19/18 1821 05/20/18 0418 05/21/18 0359 05/22/18 0331 05/23/18 0324  LIPASE 581* 179* 272* 119* 56*   No results for input(s): AMMONIA in the last 168 hours. Coagulation Profile: No results for input(s): INR, PROTIME in the last 168 hours. Cardiac Enzymes: Recent Labs  Lab 05/19/18 1821  TROPONINI <0.03   BNP (last 3 results) No results for input(s): PROBNP in the last 8760 hours. HbA1C: No results for input(s): HGBA1C in the last 72 hours. CBG: Recent Labs  Lab 05/23/18 1145 05/23/18 1708 05/23/18 2137 05/24/18 0735 05/24/18 1146  GLUCAP 97 156* 229* 227* 357*   Lipid Profile: Recent Labs    05/22/18 0828  CHOL 180  HDL 33*  LDLCALC 115*  TRIG 159*  CHOLHDL 5.5   Thyroid Function Tests: No results for input(s): TSH, T4TOTAL, FREET4, T3FREE, THYROIDAB in the last 72 hours. Anemia Panel: No results for input(s): VITAMINB12, FOLATE, FERRITIN, TIBC, IRON, RETICCTPCT  in the last 72 hours. Urine analysis:    Component Value Date/Time   COLORURINE YELLOW 05/19/2018 1825   APPEARANCEUR CLEAR 05/19/2018 1825   LABSPEC >1.030 (H) 05/19/2018 1825   PHURINE 6.0 05/19/2018 1825   GLUCOSEU NEGATIVE 05/19/2018 1825   HGBUR NEGATIVE 05/19/2018 1825   BILIRUBINUR NEGATIVE 05/19/2018 1825   KETONESUR 15 (A) 05/19/2018 1825   PROTEINUR NEGATIVE 05/19/2018 1825   NITRITE NEGATIVE 05/19/2018 1825   LEUKOCYTESUR NEGATIVE 05/19/2018 1825   Sepsis Labs: @LABRCNTIP (procalcitonin:4,lacticidven:4)  ) Recent Results (from the past 240 hour(s))  Gastrointestinal Panel by PCR , Stool     Status: None   Collection Time: 05/21/18 11:09 AM  Result Value Ref Range Status   Campylobacter species NOT DETECTED NOT DETECTED Final   Plesimonas shigelloides NOT DETECTED NOT DETECTED Final   Salmonella species NOT DETECTED NOT DETECTED Final    Yersinia enterocolitica NOT DETECTED NOT DETECTED Final   Vibrio species NOT DETECTED NOT DETECTED Final   Vibrio cholerae NOT DETECTED NOT DETECTED Final   Enteroaggregative E coli (EAEC) NOT DETECTED NOT DETECTED Final   Enteropathogenic E coli (EPEC) NOT DETECTED NOT DETECTED Final   Enterotoxigenic E coli (ETEC) NOT DETECTED NOT DETECTED Final   Shiga like toxin producing E coli (STEC) NOT DETECTED NOT DETECTED Final   Shigella/Enteroinvasive E coli (EIEC) NOT DETECTED NOT DETECTED Final   Cryptosporidium NOT DETECTED NOT DETECTED Final   Cyclospora cayetanensis NOT DETECTED NOT DETECTED Final   Entamoeba histolytica NOT DETECTED NOT DETECTED Final   Giardia lamblia NOT DETECTED NOT DETECTED Final   Adenovirus F40/41 NOT DETECTED NOT DETECTED Final   Astrovirus NOT DETECTED NOT DETECTED Final   Norovirus GI/GII NOT DETECTED NOT DETECTED Final   Rotavirus A NOT DETECTED NOT DETECTED Final   Sapovirus (I, II, IV, and V) NOT DETECTED NOT DETECTED Final    Comment: Performed at [facility], [address] [place] [employer], [place] [address]      Radiology Studies: Ct Head W & Wo Contrast  Result Date: 05/23/2018 CLINICAL DATA:  Metastatic colon cancer with intractable nausea. Weakness and dizziness for 2 weeks. EXAM: CT HEAD WITHOUT AND WITH CONTRAST TECHNIQUE: Contiguous axial images were obtained from the base of the skull through the vertex without and with intravenous contrast CONTRAST:  31mL OMNIPAQUE IOHEXOL 300 MG/ML  SOLN COMPARISON:  PET-CT 11/19/2016 FINDINGS: Brain: Enhancing mass in the lateral left cerebellum measuring 3.4 cm, with extensive vasogenic edema crossing midline and narrowing the fourth ventricle. No hydrocephalus. No evidence of infarct or hemorrhage. Chronic small vessel ischemic type change in the cerebral white matter. Vascular: Visible vessels are patent. Skull: No acute finding or evidence of metastasis. Sinuses/Orbits: Negative These results were called by  telephone at the time of interpretation on 05/23/2018 at 10:23 am to Dr. Betsy Coder , who verbally acknowledged these results. 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 There is fourth ventricular narrowing without hydrocephalus. Electronically Signed   By: Monte Fantasia M.D.   On: 05/23/2018 10:23   [name] Ekg Site Rite  Result Date: 05/23/2018 If Site Rite image not attached, placement could not be confirmed due to current cardiac rhythm.    Scheduled Meds: . aspirin EC  81 mg Oral Daily  . dexamethasone  6 mg Intravenous Q6H  . enoxaparin (LOVENOX) injection  40 mg Subcutaneous Daily  . feeding supplement  1 Container Oral BID BM  . insulin aspart  0-9 Units Subcutaneous TID WC  . insulin  glargine  10 Units Subcutaneous QHS  . lisinopril  2.5 mg Oral Daily  . ondansetron (ZOFRAN) IV  4 mg Intravenous Q6H   Or  . ondansetron  4 mg Oral Q6H  . pantoprazole  40 mg Oral Daily  . tamsulosin  0.4 mg Oral Daily   Continuous Infusions: . sodium chloride 1,000 mL (05/23/18 1401)  . famotidine (PEPCID) IV Stopped (05/24/18 1052)     LOS: 3 days   Time Spent in minutes   30 minutes  Jessilyn Catino D.O. on 05/24/2018 at 12:48 PM  Between 7am to 7pm - Pager - [phone]  After 7pm go to [email or web address] - password [email or web address]  And look for the night coverage person covering for me after hours  [facility] Office  [phone]

## 2018-05-24 NOTE — Progress Notes (Addendum)
Patient ID: Allen Glenn, male   DOB: 08-24-1956, 62 y.o.   MRN: [id number]    Progress Note   Subjective   CT head - 3.4 cm mass lateral left cerebellum  With extensive vasogenic edema crossing midline , and narrowing 4th ventricle  Steroids started - lass nauseated today , ate a bagel     Objective   Vital signs in last 24 hours: Temp:  [97.9 F (36.6 C)-98.9 F (37.2 C)] 98 F (36.7 C) (06/01 0617) Pulse Rate:  [70-83] 72 (06/01 0617) Resp:  [15-16] 16 (06/01 0617) BP: (150-154)/(72-77) 154/73 (06/01 0617) SpO2:  [97 %-100 %] 99 % (06/01 0617) Last BM Date: 05/21/18 General:    white male in NAD, ill appearing, wife at bedside Heart:  Regular rate and rhythm; no murmurs Lungs: Respirations even and unlabored, lungs CTA bilaterally Abdomen:  Soft, nontender and nondistended. Normal bowel sounds. Extremities:  Without edema. Neurologic:  Alert and oriented,  grossly normal neurologically. Psych:  Cooperative. Normal mood and affect.  Intake/Output from previous day: 05/31 0701 - 06/01 0700 In: 240 [P.O.:240] Out: -  Intake/Output this shift: Total I/O In: 480 [P.O.:480] Out: -   Lab Results: No results for input(s): WBC, HGB, HCT, PLT in the last 72 hours. BMET Recent Labs    05/22/18 0331 05/23/18 0324  NA 142 141  K 3.3* 3.5  CL 109 107  CO2 23 24  GLUCOSE 106* 94  BUN 7 7  CREATININE 0.67 0.65  CALCIUM 8.5* 8.5*   LFT Recent Labs    05/23/18 0324  PROT 6.3*  ALBUMIN 3.4*  AST 15  ALT 14*  ALKPHOS 190*  BILITOT 1.0   PT/INR No results for input(s): LABPROT, INR in the last 72 hours.  Studies/Results: Ct Head W & Wo Contrast  Result Date: 05/23/2018 CLINICAL DATA:  Metastatic colon cancer with intractable nausea. Weakness and dizziness for 2 weeks. EXAM: CT HEAD WITHOUT AND WITH CONTRAST TECHNIQUE: Contiguous axial images were obtained from the base of the skull through the vertex without and with intravenous contrast CONTRAST:  43mL OMNIPAQUE  IOHEXOL 300 MG/ML  SOLN COMPARISON:  PET-CT 11/19/2016 FINDINGS: Brain: Enhancing mass in the lateral left cerebellum measuring 3.4 cm, with extensive vasogenic edema crossing midline and narrowing the fourth ventricle. No hydrocephalus. No evidence of infarct or hemorrhage. Chronic small vessel ischemic type change in the cerebral white matter. Vascular: Visible vessels are patent. Skull: No acute finding or evidence of metastasis. Sinuses/Orbits: Negative These results were called by telephone at the time of interpretation on 05/23/2018 at 10:23 am to Dr. Betsy Coder , who verbally acknowledged these results. 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 There is fourth ventricular narrowing without hydrocephalus. Electronically Signed   By: Monte Fantasia M.D.   On: 05/23/2018 10:23   [name] Ekg Site Rite  Result Date: 05/23/2018 If Site Rite image not attached, placement could not be confirmed due to current cardiac rhythm.      Assessment / Plan:     #1 62 yo WM with metastatic Colon Ca with persistent Nausea/vomtiing - unfortunately CT of head shows large metastatic lesion left cerebellum with extensive surrounding edema  Dr Benay Spice aware, steroids started, Neuosurgery has seen as well     Will change Zofran to around the clock and leave Phenergan prn  GI will be available     Contact  Amy Esterwood, P.A.-C               [phone]   Principal Problem:   Nausea & vomiting Active Problems:   CAD s/p PCI LAD 06/2013 [facility]   Other chronic pancreatitis (HCC)   Metastatic colon cancer to liver (HCC)   HTN (hypertension)     LOS: 3 days   Amy Esterwood  05/24/2018, 10:23 AM    GI ATTENDING - covering for Dr. Benson Norway  Case discussed with Dr. Benson Norway. Agree with interval progress note as outlined above. Unfortunately the patient has brain metastases to explain his nausea and vomiting. Agree with plans as outlined above. Oncology on board. GI  will sign off. Follow-up with Dr. Benson Norway as needed.  Docia Chuck. Geri Seminole., M.D. [facility] Division of Gastroenterology

## 2018-05-25 LAB — COMPREHENSIVE METABOLIC PANEL
ALBUMIN: 3.2 g/dL — AB (ref 3.5–5.0)
ALK PHOS: 251 U/L — AB (ref 38–126)
ALT: 18 U/L (ref 17–63)
ANION GAP: 9 (ref 5–15)
AST: 31 U/L (ref 15–41)
BILIRUBIN TOTAL: 0.5 mg/dL (ref 0.3–1.2)
BUN: 15 mg/dL (ref 6–20)
CALCIUM: 8.2 mg/dL — AB (ref 8.9–10.3)
CO2: 21 mmol/L — AB (ref 22–32)
Chloride: 111 mmol/L (ref 101–111)
Creatinine, Ser: 0.75 mg/dL (ref 0.61–1.24)
GFR calc Af Amer: >60 mL/min (ref >60)
GFR calc non Af Amer: >60 mL/min (ref >60)
Glucose, Bld: 239 mg/dL — ABNORMAL HIGH (ref 65–99)
Potassium: 4.1 mmol/L (ref 3.5–5.1)
SODIUM: 141 mmol/L (ref 135–145)
TOTAL PROTEIN: 5.6 g/dL — AB (ref 6.5–8.1)

## 2018-05-25 LAB — GLUCOSE, CAPILLARY
GLUCOSE-CAPILLARY: 306 mg/dL — AB (ref 65–99)
Glucose-Capillary: 199 mg/dL — ABNORMAL HIGH (ref 65–99)
Glucose-Capillary: 364 mg/dL — ABNORMAL HIGH (ref 65–99)
Glucose-Capillary: 361 mg/dL — ABNORMAL HIGH (ref 65–99)

## 2018-05-25 MED ORDER — TROLAMINE SALICYLATE 10 % EX CREA
TOPICAL_CREAM | CUTANEOUS | Status: DC | PRN
  Filled 2018-05-25: qty 85

## 2018-05-25 MED ORDER — INSULIN ASPART 100 UNIT/ML ~~LOC~~ SOLN
0.0000 [IU] | Freq: Three times a day (TID) | SUBCUTANEOUS | Status: DC
  Administered 2018-05-25 (×2): 15 [IU] via SUBCUTANEOUS
  Administered 2018-05-26: 5 [IU] via SUBCUTANEOUS
  Administered 2018-05-26: 11 [IU] via SUBCUTANEOUS

## 2018-05-25 MED ORDER — INSULIN GLARGINE 100 UNIT/ML ~~LOC~~ SOLN
13.0000 [IU] | Freq: Every day | SUBCUTANEOUS | Status: DC
  Administered 2018-05-25: 13 [IU] via SUBCUTANEOUS
  Filled 2018-05-25 (×2): qty 0.13

## 2018-05-25 NOTE — Progress Notes (Signed)
PROGRESS NOTE    Allen Glenn  CBJ:[id number] DOB: Apr 18, 1956 DOA: 05/19/2018 PCP: [name], MD   Brief Narrative:  HPI On 05/20/2018 by Dr. Lu Duffel is a 62 y.o. male with history of metastatic colon cancer with slow progression being followed by Dr. [name], oncologist, CAD status post stenting presents to the ER admits in [facility] with complaints of persistent nausea vomiting.  Patient was diagnosed with pancreatitis with elevated lipase by patient's primary care physician last week.  Also had a subsequent CAT scan done which showed progression of his metastasis.  Since then patient has been having progressive nausea vomiting and weakness.  Presents to the ER admits in [facility]  Denies any chest pain or shortness of breath fever or chills.  Interim history Admitted for persistent nausea and vomiting.  Thought to have pancreatitis however this was ruled out as patient had no gallstones noted on imaging.  Lipase has improved.  CT head was obtained which did show metastasis which could be the cause of patient's persistent nausea and vomiting.  He has been started on IV Decadron.  Radiation oncology also consulted. Assessment & Plan   Persistent Nausea, vomiting -CT scan on 05/20/2018 showed stable CT appearance of the abdomen compared to recent prior study.  No new/acute findings.  Suspect small gallstone or possible polyp in the gallbladder.  No findings of acute cholecystitis.  No biliary dilatation. -Lipase on admission 581, improved to 56 today -Gastroenterology, Dr. Benson Norway, consulted and appreciated, recommended surgical consult -General surgery consulted and appreciated.  Plan to obtain ultrasound to determine if patient has cholelithiasis.  Given patient's current metastatic disease as well as previous operations, surgical intervention may be very difficult. -RUQ Abd ultrasound: Gallbladder.  Liver metastasis as previously demonstrated on CT -Check triglycerides,  159 -S/p EGD: Mild severe reflux otitis, 3 cm hiatal hernia, duodenitis.  Normal stomach, biopsied. -HIDA scan was ordered however patient could not tolerate contrast. -CT head obtained: 3.4 cm.  Left cerebellar metastasis with extensive edema.  Fourth ventricular narrowing without hydrocephalus. -Discussed with oncology, started on IV Decadron.  Plans to consult neurosurgery-official consult 05/26/2018. -Patient was first thought to have pancreatitis given his elevated lipase on admission however work-up thus far has been negative and no stones have been noted on imaging. -Patient placed on regular diet and was able to tolerate well. -Continue antiemetics scheduled and PRN  Diabetes mellitus, type II -Continue Lantus, insulin sliding scale, CBG monitoring -CBGs have been uncontrolled, likely secondary to Decadron -Change insulin sliding scale to moderate and increased lantus to 13u  Coronary artery disease -Stable, no complaints of chest pain -Continue aspirin  Essential hypertension -BP appears to be stable -Continue lisinopril  Metastatic colon cancer -Followed by [name], oncology -CT head as listed above, decadron started, and plan to consult neurosurgery -plan for MRI  BPH -Continue Flomax  Hypokalemia -Resolved, continue to monitor BMP  Back pain -Aspercreme and kpad ordered  DVT Prophylaxis  lovenox  Code Status: Full  Family Communication: Family at bedside  Disposition Plan: Admitted, pending further recommendations from oncology/neurosurgery  Consultants Gastroenterology, Dr. Benson Norway General surgery Oncology  Procedures  RUQ Abd [name]  Antibiotics   Anti-infectives (From admission, onward)   None      Subjective:   [name] seen and examined today.  Feeling better and has been able to tolerate diet. Denies current chest pain, shortness of breath, abdominal pain, N/V/D. Has not had a bowel movement.   Objective:  Vitals:   05/24/18 0617  05/24/18 1332 05/24/18 2200 05/25/18 0602  BP: (!) 154/73 (!) 142/71 128/68 127/72  Pulse: 72 61 (!) 51 (!) 51  Resp: 16 16 14 14   Temp: 98 F (36.7 C) 98.6 F (37 C) 97.8 F (36.6 C) 97.7 F (36.5 C)  TempSrc: Oral Oral Oral Oral  SpO2: 99% 100% 99% 99%  Weight:      Height:        Intake/Output Summary (Last 24 hours) at 05/25/2018 1111 Last data filed at 05/25/2018 0854 Gross per 24 hour  Intake 1080 ml  Output 1125 ml  Net -45 ml   Filed Weights   05/19/18 1812 05/19/18 2250 05/23/18 0816  Weight: 82.1 kg (181 lb) 83.5 kg (184 lb 1.4 oz) 81.9 kg (180 lb 9.6 oz)   Exam  General: Well developed, well nourished, NAD, appears stated age  HEENT: NCAT,  mucous membranes moist.   Neck: Supple  Cardiovascular: S1 S2 auscultated, no rubs, murmurs or gallops. Regular rate and rhythm.  Respiratory: Clear to auscultation bilaterally with equal chest rise  Abdomen: Soft, nontender, nondistended, + bowel sounds  Extremities: warm dry without cyanosis clubbing or edema  Neuro: AAOx3, nonfocal  Psych: Normal affect and demeanor with intact judgement and insight  Data Reviewed: I have personally reviewed following labs and imaging studies  CBC: Recent Labs  Lab 05/19/18 1821 05/20/18 0418 05/21/18 0359  WBC 8.6 6.1 5.1  NEUTROABS 6.4  --  3.1  HGB 16.1 13.5 12.7*  HCT 46.7 41.5 39.5  MCV 85.8 88.1 87.4  PLT 251 211 009   Basic Metabolic Panel: Recent Labs  Lab 05/20/18 0418 05/21/18 0359 05/22/18 0331 05/23/18 0324 05/25/18 0455  NA 141 140 142 141 141  K 3.7 3.5 3.3* 3.5 4.1  CL 108 108 109 107 111  CO2 22 25 23 24  21*  GLUCOSE 143* 127* 106* 94 239*  BUN 8 7 7 7 15   CREATININE 0.70 0.72 0.67 0.65 0.75  CALCIUM 8.4* 8.3* 8.5* 8.5* 8.2*  MG  --  2.3  --  2.2  --   PHOS  --  3.5  --  4.0  --    GFR: Estimated Creatinine Clearance: 112.3 mL/min (by C-G formula based on SCr of 0.75 mg/dL). Liver Function Tests: Recent Labs  Lab 05/20/18 0418  05/21/18 0359 05/22/18 0331 05/23/18 0324 05/25/18 0455  AST 15 15 16 15 31   ALT 14* 12* 12* 14* 18  ALKPHOS 224* 192* 204* 190* 251*  BILITOT 0.9 0.6 0.8 1.0 0.5  PROT 6.6 6.1* 6.3* 6.3* 5.6*  ALBUMIN 3.4* 3.1* 3.4* 3.4* 3.2*   Recent Labs  Lab 05/19/18 1821 05/20/18 0418 05/21/18 0359 05/22/18 0331 05/23/18 0324  LIPASE 581* 179* 272* 119* 56*   No results for input(s): AMMONIA in the last 168 hours. Coagulation Profile: No results for input(s): INR, PROTIME in the last 168 hours. Cardiac Enzymes: Recent Labs  Lab 05/19/18 1821  TROPONINI <0.03   BNP (last 3 results) No results for input(s): PROBNP in the last 8760 hours. HbA1C: No results for input(s): HGBA1C in the last 72 hours. CBG: Recent Labs  Lab 05/24/18 0735 05/24/18 1146 05/24/18 1701 05/24/18 2201 05/25/18 0756  GLUCAP 227* 357* 312* 281* 199*   Lipid Profile: No results for input(s): CHOL, HDL, LDLCALC, TRIG, CHOLHDL, LDLDIRECT in the last 72 hours. Thyroid Function Tests: No results for input(s): TSH, T4TOTAL, FREET4, T3FREE, THYROIDAB in the last 72 hours. Anemia Panel: No  results for input(s): VITAMINB12, FOLATE, FERRITIN, TIBC, IRON, RETICCTPCT in the last 72 hours. Urine analysis:    Component Value Date/Time   COLORURINE YELLOW 05/19/2018 1825   APPEARANCEUR CLEAR 05/19/2018 1825   LABSPEC >1.030 (H) 05/19/2018 1825   PHURINE 6.0 05/19/2018 1825   GLUCOSEU NEGATIVE 05/19/2018 1825   HGBUR NEGATIVE 05/19/2018 1825   BILIRUBINUR NEGATIVE 05/19/2018 1825   KETONESUR 15 (A) 05/19/2018 1825   PROTEINUR NEGATIVE 05/19/2018 1825   NITRITE NEGATIVE 05/19/2018 1825   LEUKOCYTESUR NEGATIVE 05/19/2018 1825   Sepsis Labs: @LABRCNTIP (procalcitonin:4,lacticidven:4)  ) Recent Results (from the past 240 hour(s))  Gastrointestinal Panel by PCR , Stool     Status: None   Collection Time: 05/21/18 11:09 AM  Result Value Ref Range Status   Campylobacter species NOT DETECTED NOT DETECTED Final    Plesimonas shigelloides NOT DETECTED NOT DETECTED Final   Salmonella species NOT DETECTED NOT DETECTED Final   Yersinia enterocolitica NOT DETECTED NOT DETECTED Final   Vibrio species NOT DETECTED NOT DETECTED Final   Vibrio cholerae NOT DETECTED NOT DETECTED Final   Enteroaggregative E coli (EAEC) NOT DETECTED NOT DETECTED Final   Enteropathogenic E coli (EPEC) NOT DETECTED NOT DETECTED Final   Enterotoxigenic E coli (ETEC) NOT DETECTED NOT DETECTED Final   Shiga like toxin producing E coli (STEC) NOT DETECTED NOT DETECTED Final   Shigella/Enteroinvasive E coli (EIEC) NOT DETECTED NOT DETECTED Final   Cryptosporidium NOT DETECTED NOT DETECTED Final   Cyclospora cayetanensis NOT DETECTED NOT DETECTED Final   Entamoeba histolytica NOT DETECTED NOT DETECTED Final   Giardia lamblia NOT DETECTED NOT DETECTED Final   Adenovirus F40/41 NOT DETECTED NOT DETECTED Final   Astrovirus NOT DETECTED NOT DETECTED Final   Norovirus GI/GII NOT DETECTED NOT DETECTED Final   Rotavirus A NOT DETECTED NOT DETECTED Final   Sapovirus (I, II, IV, and V) NOT DETECTED NOT DETECTED Final    Comment: Performed at [facility], [address] [place], [place] [address]      Radiology Studies: No results found.   Scheduled Meds: . aspirin EC  81 mg Oral Daily  . dexamethasone  6 mg Intravenous Q6H  . enoxaparin (LOVENOX) injection  40 mg Subcutaneous Daily  . feeding supplement  1 Container Oral BID BM  . insulin aspart  0-9 Units Subcutaneous TID WC  . insulin glargine  10 Units Subcutaneous QHS  . lisinopril  2.5 mg Oral Daily  . ondansetron (ZOFRAN) IV  4 mg Intravenous Q6H   Or  . ondansetron  4 mg Oral Q6H  . pantoprazole  40 mg Oral Daily  . tamsulosin  0.4 mg Oral Daily   Continuous Infusions: . sodium chloride 1,000 mL (05/25/18 0528)  . famotidine (PEPCID) IV 20 mg (05/25/18 0942)     LOS: 4 days   Time Spent in minutes   30 minutes  Kushal Saunders D.O. on 05/25/2018 at  11:11 AM  Between 7am to 7pm - Pager - [phone]  After 7pm go to [email or web address] - password [email or web address]  And look for the night coverage person covering for me after hours  [facility] Office  [phone]

## 2018-05-26 ENCOUNTER — Encounter (HOSPITAL_COMMUNITY): Payer: Self-pay | Admitting: Gastroenterology

## 2018-05-26 ENCOUNTER — Other Ambulatory Visit: Payer: Self-pay | Admitting: Neurosurgery

## 2018-05-26 ENCOUNTER — Ambulatory Visit (HOSPITAL_COMMUNITY)
Admit: 2018-05-26 | Discharge: 2018-05-26 | Disposition: A | Discharge status: Home / Self Care | Payer: 59 | Attending: Oncology | Admitting: Oncology

## 2018-05-26 ENCOUNTER — Other Ambulatory Visit: Payer: Self-pay | Admitting: *Deleted

## 2018-05-26 DIAGNOSIS — C7931 Secondary malignant neoplasm of brain: Secondary | ICD-10-CM

## 2018-05-26 DIAGNOSIS — C716 Malignant neoplasm of cerebellum: Secondary | ICD-10-CM | POA: Diagnosis not present

## 2018-05-26 DIAGNOSIS — G43A1 Cyclical vomiting, intractable: Secondary | ICD-10-CM

## 2018-05-26 LAB — BASIC METABOLIC PANEL
Anion gap: 8 (ref 5–15)
BUN: 12 mg/dL (ref 6–20)
CHLORIDE: 111 mmol/L (ref 101–111)
CO2: 21 mmol/L — AB (ref 22–32)
Calcium: 8 mg/dL — ABNORMAL LOW (ref 8.9–10.3)
Creatinine, Ser: 0.64 mg/dL (ref 0.61–1.24)
GFR calc non Af Amer: >60 mL/min (ref >60)
Glucose, Bld: 247 mg/dL — ABNORMAL HIGH (ref 65–99)
POTASSIUM: 4 mmol/L (ref 3.5–5.1)
SODIUM: 140 mmol/L (ref 135–145)

## 2018-05-26 LAB — GLUCOSE, CAPILLARY
Glucose-Capillary: 240 mg/dL — ABNORMAL HIGH (ref 65–99)
Glucose-Capillary: 326 mg/dL — ABNORMAL HIGH (ref 65–99)

## 2018-05-26 LAB — CBC
HEMATOCRIT: 37.3 % — AB (ref 39.0–52.0)
HEMOGLOBIN: 12.2 g/dL — AB (ref 13.0–17.0)
MCH: 28.6 pg (ref 26.0–34.0)
MCHC: 32.7 g/dL (ref 30.0–36.0)
MCV: 87.4 fL (ref 78.0–100.0)
Platelets: 203 10*3/uL (ref 150–400)
RBC: 4.27 MIL/uL (ref 4.22–5.81)
RDW: 13.2 % (ref 11.5–15.5)
WBC: 11.8 10*3/uL — ABNORMAL HIGH (ref 4.0–10.5)

## 2018-05-26 MED ORDER — METHOCARBAMOL 750 MG PO TABS: 750.0000 mg | ORAL_TABLET | Freq: Three times a day (TID) | ORAL | 0 refills | Status: DC | PRN

## 2018-05-26 MED ORDER — INSULIN ASPART 100 UNIT/ML FLEXPEN: PEN_INJECTOR | SUBCUTANEOUS | 2 refills | Status: DC

## 2018-05-26 MED ORDER — DEXAMETHASONE 4 MG PO TABS
8.0000 mg | ORAL_TABLET | Freq: Two times a day (BID) | ORAL | Status: DC
  Administered 2018-05-26: 8 mg via ORAL
  Filled 2018-05-26: qty 2

## 2018-05-26 MED ORDER — BASAGLAR KWIKPEN 100 UNIT/ML ~~LOC~~ SOPN: 12.0000 [IU] | PEN_INJECTOR | Freq: Every day | SUBCUTANEOUS | Status: DC

## 2018-05-26 MED ORDER — ZOLPIDEM TARTRATE 5 MG PO TABS: 5.0000 mg | ORAL_TABLET | Freq: Every evening | ORAL | 0 refills | Status: DC | PRN

## 2018-05-26 MED ORDER — DEXAMETHASONE 4 MG PO TABS: 8.0000 mg | ORAL_TABLET | Freq: Two times a day (BID) | ORAL | 0 refills | Status: DC

## 2018-05-26 MED ORDER — INSULIN PEN NEEDLE 31G X 5 MM MISC: 0 refills | Status: DC

## 2018-05-26 MED ORDER — BASAGLAR KWIKPEN 100 UNIT/ML ~~LOC~~ SOPN: 12.0000 [IU] | PEN_INJECTOR | Freq: Every day | SUBCUTANEOUS | 1 refills | Status: DC

## 2018-05-26 MED ORDER — GADOBENATE DIMEGLUMINE 529 MG/ML IV SOLN
20.0000 mL | Freq: Once | INTRAVENOUS | Status: AC | PRN
  Administered 2018-05-26: 18 mL via INTRAVENOUS

## 2018-05-26 MED ORDER — ONDANSETRON 4 MG PO TBDP: 4.0000 mg | ORAL_TABLET | Freq: Three times a day (TID) | ORAL | 0 refills | Status: DC | PRN

## 2018-05-26 MED FILL — ONDANSETRON ODT 4 MG TABLET: 4 | 15 days supply | Qty: 45 | Fill #0

## 2018-05-26 MED FILL — METHOCARBAMOL 750 MG TABS: 750 | 10 days supply | Qty: 30 | Fill #0

## 2018-05-26 MED FILL — HUMALOG 100 UNITS/ML KWIKPE: 100 | 25 days supply | Qty: 15 | Fill #0

## 2018-05-26 MED FILL — LANTUS SOLOSTAR 100 UNITS/M: 100 | 25 days supply | Qty: 3 | Fill #0

## 2018-05-26 MED FILL — UNIFINE PENTIPS 32GX5/32: 32G X 4 MM | 90 days supply | Qty: 100 | Fill #0

## 2018-05-26 MED FILL — UNIFINE PENTIPS 32GX5/32": 32G X 4 MM | 90 days supply | Qty: 100 | Fill #0

## 2018-05-26 MED FILL — NITROGLYCERIN 0.4 MG TAB SL: 0.4 | 25 days supply | Qty: 25 | Fill #0

## 2018-05-26 MED FILL — DEXAMETHASONE 4 MG TABLET: 4 | 15 days supply | Qty: 60 | Fill #0

## 2018-05-26 MED FILL — ZOLPIDEM TARTRATE 5 MG TAB: 5 | 30 days supply | Qty: 30 | Fill #0

## 2018-05-26 NOTE — Progress Notes (Addendum)
IP PROGRESS NOTE  Subjective:   Allen Glenn reports feeling better since starting Decadron.  No further nausea or vomiting.  He had discomfort in the right shoulder yesterday.  This is better today.  He wants to go home.   Objective: Vital signs in last 24 hours: Blood pressure 121/69, pulse (!) 50, temperature 98 F (36.7 C), temperature source Oral, resp. rate 18, height 6' 2"  (1.88 m), weight 180 lb 9.6 oz (81.9 kg), SpO2 99 %.  Intake/Output from previous day: 06/02 0701 - 06/03 0700 In: 240 [P.O.:240] Out: 1025 [Urine:1025]  Physical Exam:   Musculoskeletal: No pain with motion at the right shoulder Extremities: No leg edema Neurologic: Alert and oriented    Lab Results: Recent Labs    05/26/18 0518  WBC 11.8*  HGB 12.2*  HCT 37.3*  PLT 203    BMET Recent Labs    05/25/18 0455 05/26/18 0518  NA 141 140  K 4.1 4.0  CL 111 111  CO2 21* 21*  GLUCOSE 239* 247*  BUN 15 12  CREATININE 0.75 0.64  CALCIUM 8.2* 8.0*   05/23/2018: Alkaline phosphatase 190, lipase 56, bilirubin 1.0  Lab Results  Component Value Date   CEA1 20.3 (H) 05/23/2018     Medications: I have reviewed the patient's current medications.  Assessment/plan: 1. 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 No BRAFmutation  Liver biopsy 05/04/2015-metastatic adenocarcinoma consistent with a colon primary ? Staging PET scan 06/08/2015-isolated segment 4A liver lesion ? Initiation of adjuvant FOLFOX 06/13/2015 ? Restaging CT 08/09/2015 revealed a slight decrease in a borderline ileocolic node, decrease in the hepatic dome metastasis, no new lesions ? Liver resection 09/28/2015-pathology consistent with metastatic colon cancer, negative margins ? Adjuvant FOLFOX resumed 11/08/2015, oxaliplatin eliminated beginning 11/22/2015 secondary to neuropathy. He  completed adjuvant chemotherapy 02/16/2016 ? Restaging chest CT 11/08/2016, compared to 05/07/2016 revealed a new 9 mm left upper lobe nodule, stable 2.2 cm right hepatic lesion ? PET scan 11/19/2016 confirmed a hypermetabolic left upper lobe nodule, hypermetabolic left paratracheal and pericardiac lymph nodes, and hypermetabolism associated with the hypoattenuating lesion in the dome of the liver ? Status post EBUSbiopsies of the left lingula nodule and a level 4Lnode on 11/28/2016-no evidence of malignancy ? CT chest 02/11/2017-increase in size of the left pulmonary nodule and epicardial lymph node ? Status post SBRT 2 the left lung nodule and mediastinum completed 03/14/2017 ? CTs 07/01/2017-new 9 mm focus along the right liver capsule, stable left subcapsular liver lesion, radiation change at site of left upper lobe nodule, stable pericardiallymph node ? CTs 11/25/2017-new 5 mm lingular nodule, enlargement of capsular-based right liver lesion, new capsular based right liver lesion capsular lesion at the hepatic dome ? CTs 02/25/2018- enlargement of small lung nodules, liver lesions and small mesenteric lymph node ? CT abdomen/pelvis 05/15/2018- new lower lobe pulmonary nodules, enlargement of liver lesions and right lower quadrant soft tissue nodules ? CT brain 05/23/2018-solitary left cerebellar metastasis with edema and narrowing of the fourth ventricle 2. Coronary artery disease status post a myocardial infarction in 2014  3. Hypertension  4. Hyperlipidemia  5.    Diabetes  6.    Admission 05/20/2018 with nausea/vomiting secondary to a cerebellar metastasis   Allen Glenn has experienced marked clinical improvement while on Decadron.  He is scheduled for a brain MRI today.  Drs. Moody and Vertell Limber will coordinate [facility] and surgery for management  of the cerebellar lesion.  I will see Allen Glenn in 2 to 3 weeks to discuss systemic treatment options for progressive metastatic colon  cancer. Recommendations: 1.  Change Decadron to p.o. 2.  Brain MRI-SRS protocol 3.  Discharge to home, outpatient follow-up with radiation oncology and neurosurgery this week 4.  Outpatient follow-up in medical oncology in 2-3 weeks 5.  Insulin sliding scale while on Decadron   LOS: 5 days   Betsy Coder, MD   05/26/2018, 8:11 AM

## 2018-05-26 NOTE — Consult Note (Signed)
Reason for Consult:cerebellar metastasis Referring Physician: Exavier Glenn is an 62 y.o. male.  HPI: Allen Glenn a 62 y.o.malewithhistory of metastatic colon cancer with slow progression being followed by Allen Glenn, oncologist, CAD status post stenting presents to the ER admits in [facility] with complaints of persistent nausea vomiting. Patient was diagnosed with pancreatitis with elevated lipase by patient's primary care physician last week. Also had a subsequent CAT scan done which showed progression of his metastasis. Since then patient has been having progressive nausea vomiting and weakness. Presented to the ER admits in [facility]. Denies any chest pain or shortness of breath fever or chills.  Interim history Admitted for persistent nausea and vomiting.  Thought to have pancreatitis however this was ruled out as patient had no gallstones noted on imaging.  Lipase has improved.  CT head was obtained which did show metastasis which could be the cause of patient's persistent nausea and vomiting.  He has been started on IV Decadron.  Radiation oncology also consulted.  Patient is an established patient of Dr. Lisbeth Glenn, who saw him at the end of the week.  I spoke with Dr. Benay Glenn and went to see the patient and his wife this morning after discussing his case at our multi-disciplinary brain tumor conference this morning.   His head CT shows an enhancing lesion, most consistent with a metastasis from his colon cancer, in the left cerebellar hemisphere, measuring 3.4 cm in diameter with peri-tumoral edema and effacement of the 4th ventricle.  There is no hydrocephalus or suggestion of any other metastatic lesions.  Since starting on decadron, the patient has had resolution of headache and nausea.  He was having gait incoordination (falling to the left) and coordination problems using his left side and this has also improved.  His CBGs have been elevated and this is being corrected  with insulin.  His wife is a nurse who specializes in diabetes care and feels very comfortable managing his sliding scale with insulin coverage at home.    Past Medical History:  Diagnosis Date  . Acute MI ([place]) 2014   acute ST elevation MI  . Anemia   . Colon cancer ([place]) 06/13/2016   Status post resection of colon mass is well as he panic metastasis.   . Coronary artery disease   . Enlarged prostate    slightly and takes Flomax daily  . Heart disease   . History of blood transfusion   . Hyperlipidemia    takes Lipitor daily  . Hypertension    takes Lisinopril daily  . Lung nodule    left  . Nocturia   . Numbness    left foot  . Peripheral neuropathy     Past Surgical History:  Procedure Laterality Date  . 1/8 of liver removed    . APPENDECTOMY    . COLONOSCOPY    . CORONARY ANGIOPLASTY     1 stent  . CORONARY STENT PLACEMENT  06-27-2013  . HERNIA REPAIR Left 1991  . LAPAROSCOPIC RIGHT COLECTOMY  2016   [place]  . LIVER LOBECTOMY Right 09/28/2015    Right partial hepate[facility] at [facility]  . LUNG BIOPSY Left 11/28/2016   Procedure: LUNG BIOPSY, left upper lobe;  Surgeon: Allen Isaac, MD;  Location: [facility];  Service: Thoracic;  Laterality: Left;  Marland Kitchen VIDEO BRONCHOSCOPY WITH ENDOBRONCHIAL NAVIGATION N/A 11/28/2016   Procedure: VIDEO BRONCHOSCOPY WITH ENDOBRONCHIAL NAVIGATION;  Surgeon: Allen Isaac, MD;  Location: [place] [facility];  Service: Thoracic;  Laterality:  N/A;  . VIDEO BRONCHOSCOPY WITH ENDOBRONCHIAL ULTRASOUND N/A 11/28/2016   Procedure: VIDEO BRONCHOSCOPY WITH ENDOBRONCHIAL ULTRASOUND;  Surgeon: Allen Isaac, MD;  Location: [facility] OR;  Service: Thoracic;  Laterality: N/A;    Family History  Problem Relation Age of [place]  . Hypertension Mother   . Heart disease Mother   . Hypertension Father   . Heart disease Father   . Neuropathy Neg Hx     Social History:  reports that he has never smoked. He has never used smokeless tobacco. He reports that he does not drink  alcohol or use drugs.  Allergies:  Allergies  Allergen Reactions  . No Known Allergies     Medications: I have reviewed the patient's current medications.  Results for orders placed or performed during the hospital encounter of 05/19/18 (from the past 48 hour(s))  Glucose, capillary     Status: Abnormal   Collection Time: 05/24/18 11:46 AM  Result Value Ref Range   Glucose-Capillary 357 (H) 65 - 99 mg/dL   Comment 1 Notify RN    Comment 2 Document in Chart   Glucose, capillary     Status: Abnormal   Collection Time: 05/24/18  5:01 PM  Result Value Ref Range   Glucose-Capillary 312 (H) 65 - 99 mg/dL   Comment 1 Notify RN    Comment 2 Document in Chart   Glucose, capillary     Status: Abnormal   Collection Time: 05/24/18 10:01 PM  Result Value Ref Range   Glucose-Capillary 281 (H) 65 - 99 mg/dL   Comment 1 Notify RN   Comprehensive metabolic panel     Status: Abnormal   Collection Time: 05/25/18  4:55 AM  Result Value Ref Range   Sodium 141 135 - 145 mmol/L   Potassium 4.1 3.5 - 5.1 mmol/L   Chloride 111 101 - 111 mmol/L   CO2 21 (L) 22 - 32 mmol/L   Glucose, Bld 239 (H) 65 - 99 mg/dL   BUN 15 6 - 20 mg/dL   Creatinine, Ser 0.75 0.61 - 1.24 mg/dL   Calcium 8.2 (L) 8.9 - 10.3 mg/dL   Total Protein 5.6 (L) 6.5 - 8.1 g/dL   Albumin 3.2 (L) 3.5 - 5.0 g/dL   AST 31 15 - 41 U/L   ALT 18 17 - 63 U/L   Alkaline Phosphatase 251 (H) 38 - 126 U/L   Total Bilirubin 0.5 0.3 - 1.2 mg/dL   GFR calc non Af Amer >60 >60 mL/min   GFR calc Af Amer >60 >60 mL/min    Comment: (NOTE) The eGFR has been calculated using the CKD EPI equation. This calculation has not been validated in all clinical situations. eGFR's persistently <60 mL/min signify possible Chronic Kidney Disease.    Anion gap 9 5 - 15    Comment: Performed at [facility], [place] [address], [place], [place] [address]  Glucose, capillary     Status: Abnormal   Collection Time: 05/25/18  7:56 AM  Result  Value Ref Range   Glucose-Capillary 199 (H) 65 - 99 mg/dL   Comment 1 Notify RN    Comment 2 Document in Chart   Glucose, capillary     Status: Abnormal   Collection Time: 05/25/18 12:00 PM  Result Value Ref Range   Glucose-Capillary 364 (H) 65 - 99 mg/dL   Comment 1 Notify RN    Comment 2 Document in Chart   Glucose, capillary     Status: Abnormal  Collection Time: 05/25/18  4:43 PM  Result Value Ref Range   Glucose-Capillary 361 (H) 65 - 99 mg/dL  Glucose, capillary     Status: Abnormal   Collection Time: 05/25/18  7:38 PM  Result Value Ref Range   Glucose-Capillary 306 (H) 65 - 99 mg/dL   Comment 1 Notify RN    Comment 2 Document in Chart   CBC     Status: Abnormal   Collection Time: 05/26/18  5:18 AM  Result Value Ref Range   WBC 11.8 (H) 4.0 - 10.5 K/uL   RBC 4.27 4.22 - 5.81 MIL/uL   Hemoglobin 12.2 (L) 13.0 - 17.0 g/dL   HCT 37.3 (L) 39.0 - 52.0 %   MCV 87.4 78.0 - 100.0 fL   MCH 28.6 26.0 - 34.0 pg   MCHC 32.7 30.0 - 36.0 g/dL   RDW 13.2 11.5 - 15.5 %   Platelets 203 150 - 400 K/uL    Comment: Performed at [facility], [place] [address], [place], [place] [address]  Basic metabolic panel     Status: Abnormal   Collection Time: 05/26/18  5:18 AM  Result Value Ref Range   Sodium 140 135 - 145 mmol/L   Potassium 4.0 3.5 - 5.1 mmol/L   Chloride 111 101 - 111 mmol/L   CO2 21 (L) 22 - 32 mmol/L   Glucose, Bld 247 (H) 65 - 99 mg/dL   BUN 12 6 - 20 mg/dL   Creatinine, Ser 0.64 0.61 - 1.24 mg/dL   Calcium 8.0 (L) 8.9 - 10.3 mg/dL   GFR calc non Af Amer >60 >60 mL/min   GFR calc Af Amer >60 >60 mL/min    Comment: (NOTE) The eGFR has been calculated using the CKD EPI equation. This calculation has not been validated in all clinical situations. eGFR's persistently <60 mL/min signify possible Chronic Kidney Disease.    Anion gap 8 5 - 15    Comment: Performed at [facility], [place] [address], [place], [place] [address]  Glucose,  capillary     Status: Abnormal   Collection Time: 05/26/18  7:20 AM  Result Value Ref Range   Glucose-Capillary 240 (H) 65 - 99 mg/dL   Comment 1 Notify RN    Comment 2 Document in Chart     No results found.  Review of Systems - Negative except headache, nausea, left and right shoulder pain and muscular discomfort, all improved on steroids    Blood pressure 121/69, pulse (!) 50, temperature 98 F (36.7 C), temperature source Oral, resp. rate 18, height _0  (1.88 m), weight 81.9 kg (180 lb 9.6 oz), SpO2 99 %. Physical Exam  Constitutional: He is oriented to person, place, and time. He appears well-developed and well-nourished.  HENT:  Head: Normocephalic and atraumatic.  Eyes: Pupils are equal, round, and reactive to light. EOM are normal.  Neck: Normal range of motion. Neck supple.  Musculoskeletal: Normal range of motion.  Neurological: He is alert and oriented to person, place, and time. He has normal strength and normal reflexes. No cranial nerve deficit or sensory deficit. Coordination abnormal. Gait normal. GCS eye subscore is 4. GCS verbal subscore is 5. GCS motor subscore is 6.  Patient has slight dysmetria on the left and incoordination on the left with heel-shin testing.  He has no pronator drift.      Assessment/Plan:  The patient has, what appears to be a solitary left cerebellar metastasis on head CT, measuring  3.4 cm in diameter.  He presented with protracted nausea nad vomiting.  This has improved on decadron.  He has not vomited for two days and no longer is complaining of nausea.  His gait imbalance has also improved.  He is to get an SRS protocol MRI of the brain this morning at [facility].  He will require treatment for this lesion.  Our considered opinion from the multi-disciplinary brain tumor conference is that treatment should consist of preoperative SRS followed by suboccipital craniectomy and tumor resection using Brainlab for neuro-navigation.  The lesion is too  large for SRS alone.  He wishes to attend his grandchild's pre-school graduation this Friday.  Since he is doing so much better on decadron, we will discharge him home on steroids (4 mg QID) with insulin sliding scale for diabetes.  His wife will manage his sliding scale.  He will undergo SIM this week with SRS next Monday (6/10) and we will tentatively plan on surgery that Wednesday (6/12).  He hopes to go on a family vacation at the end of the month and I told him this seems like a realistic plan.  Patient and his wife are well aware of risks of therapy (and the risks of no treatment) and wish to proceed with this plan.  Peggyann Shoals, MD 05/26/2018, 9:13 AM

## 2018-05-26 NOTE — Progress Notes (Signed)
Patient discharged to home, all discharge medications and instructions reviewed and questions answered.  Patient to be assisted to vehicle by wheelchair.  

## 2018-05-26 NOTE — Progress Notes (Signed)
Inpatient Diabetes Program Recommendations  AACE/ADA: New Consensus Statement on Inpatient Glycemic Control (2015)  Target Ranges:  Prepandial:   less than 140 mg/dL      Peak postprandial:   less than 180 mg/dL (1-2 hours)      Critically ill patients:  140 - 180 mg/dL   Lab Results  Component Value Date   GLUCAP 240 (H) 05/26/2018   HGBA1C 6.4 (H) 03/07/2015    Review of Glycemic Control  Diabetes history: DM2 Outpatient Diabetes medications: Lantus 10 units QHS, metformin discontinued Current orders for Inpatient glycemic control: Lantus 13 units QHS, Novolog 0-15 units tidwc  Inpatient Diabetes Program Recommendations:     Add HS correction Increase Lantus to 12 units QHS  Follow.  Thank you. Lorenda Peck, RD, LDN, CDE Inpatient Diabetes Coordinator [phone]

## 2018-05-26 NOTE — Discharge Instructions (Signed)
Metastatic Brain Tumor  A metastatic brain tumor is a growth in your brain. It is made of cancer cells from another part of your body that traveled to your brain through your bloodstream, along the nerves of your brain (cranial nerves), or through the opening at the base of your skull.  What are the causes?  The most common cause of a metastatic brain tumor in men is lung cancer. In women, it is breast cancer. Other common causes include:   Unknown types of cancers.   Skin cancer (melanoma).   Colon cancer.   Kidney cancer.    What are the signs or symptoms?  Most signs and symptoms of metastatic brain tumors are caused by increased pressure inside your brain. A headache is often the first symptom. Eventually, almost everyone with a metastatic brain tumor has a headache. Other signs and symptoms include:   Vomiting.   Weakness or fatigue.   Seizures.   Sensory problems, like tingling or numbness.   Problems walking.   Clumsiness.   Emotional changes.   Personality changes.   Changes in speech or vision.    How is this diagnosed?  Your health care provider can diagnose a metastatic brain tumor from your medical history and physical exam. You may also have some additional tests, including:   A nervous system function test (neurologic exam).   Imaging studies of your brain, such as an MRI and CT scan.   Having a small piece of tissue removed (biopsy) from your brain to be checked under a microscope.    How is this treated?  Treatment depends on the type of cancer you have and your overall health. It also depends on the size of your tumor and the number of brain tumors you have. The most common treatments include:   Medicines to control pain, nausea, and seizures.   Cancer-killing drugs (chemotherapy).   An X-ray treatment called radiation therapy to kill brain tumor cells.   A type of radiation therapy that is targeted to exact locations in the brain (stereotactic radiotherapy).   Surgery to remove  brain tumors.    Follow these instructions at home:   Only take medicines as directed by your health care provider.   Do not drive if:  ? You are taking strong pain medicines.  ? Your vision or thinking is not clear.   Take two 10-minute walks every day if you are able. Exercising is a good way to relieve stress. It can also help you sleep.   Be sure to get enough calories and protein in your diet.  ? If you have a poor appetite or feel nauseous, eat small meals often.  ? Supplement your diet with protein shakes or milk shakes.   It is common to have anxiety and depression when you are coping with cancer.  ? Talk to friends and loved ones about your feelings.  ? Have a good support system at home.  Contact a health care provider if:   You have chills or fever.   Your medicine is not controlling your symptoms.   Your symptoms get worse or you develop new symptoms.   You are having trouble caring for yourself or being cared for at home.   You are struggling with anxiety or depression.  Get help right away if:   You cannot stop vomiting.   You cannot keep down any foods or fluids.   You have sudden changes in speech or vision.   You have   a seizure.   You can no longer take care of yourself at home.  This information is not intended to replace advice given to you by your health care provider. Make sure you discuss any questions you have with your health care provider.  Document Released: 09/05/2004 Document Revised: 05/17/2016 Document Reviewed: 12/01/2013  [employer]vier Interactive Patient Education  2017 [employer]

## 2018-05-26 NOTE — Discharge Summary (Signed)
Physician Discharge Summary  Allen Glenn YKD:[id number] DOB: 02-Feb-1956 DOA: 05/19/2018  PCP: [name], MD  Admit date: 05/19/2018 Discharge date: 05/26/2018  Time spent: 45 minutes  Recommendations for Outpatient Follow-up:  Patient will be discharged to home.  Patient will need to follow up with primary care provider within one week of discharge.  Follow up with radiation oncology and Dr. Vertell Limber, neurosurgery. Patient should continue medications as prescribed.  Patient should follow a heart healthy/carb modified diet.   Discharge Diagnoses:  Persistent Nausea, vomiting Diabetes mellitus, type II Coronary artery disease Essential hypertension Metastatic colon cancer BPH Hypokalemia Back pain  Discharge Condition: Stable  Diet recommendation: Heart healthy/carb modified   Filed Weights   05/19/18 1812 05/19/18 2250 05/23/18 0816  Weight: 82.1 kg (181 lb) 83.5 kg (184 lb 1.4 oz) 81.9 kg (180 lb 9.6 oz)    History of present illness:  On 05/20/2018 by Dr. Jessy Oto Hanksis a 62 y.o.malewithhistory of metastatic colon cancer with slow progression being followed by Dr.Sherill, oncologist, CAD status post stenting presents to the ER admits in [facility] with complaints of persistent nausea vomiting. Patient was diagnosed with pancreatitis with elevated lipase by patient's primary care physician last week. Also had a subsequent CAT scan done which showed progression of his metastasis. Since then patient has been having progressive nausea vomiting and weakness. Presents to the ER admits in [facility]. Denies any chest pain or shortness of breath fever or chills.  Hospital Course:  Persistent Nausea, vomiting -CT scan on 05/20/2018 showed stable CT appearance of the abdomen compared to recent prior study.  No new/acute findings.  Suspect small gallstone or possible polyp in the gallbladder.  No findings of acute cholecystitis.  No biliary dilatation. -Lipase on  admission 581, improved to 56 today -Gastroenterology, Dr. Benson Norway, consulted and appreciated, recommended surgical consult -General surgery consulted and appreciated.  Plan to obtain ultrasound to determine if patient has cholelithiasis.  Given patient's current metastatic disease as well as previous operations, surgical intervention may be very difficult. -RUQ Abd ultrasound: Gallbladder.  Liver metastasis as previously demonstrated on CT -Check triglycerides, 159 -S/p EGD: Mild severe reflux otitis, 3 cm hiatal hernia, duodenitis.  Normal stomach, biopsied. -HIDA scan was ordered however patient could not tolerate contrast. -CT head obtained: 3.4 cm.  Left cerebellar metastasis with extensive edema.  Fourth ventricular narrowing without hydrocephalus. -Discussed with oncology, started on IV Decadron.  Plans to consult neurosurgery-official consult 05/26/2018- see discussion below -Patient was first thought to have pancreatitis given his elevated lipase on admission however work-up thus far has been negative and no stones have been noted on imaging. -Patient placed on regular diet and was able to tolerate well. 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, oncology -CT head as listed above -Neurosurgery consulted and appreciated, recommended SRS protocol MRI, discharging the patient on Decadron 4mg  QID, with SRS on 6/10, and tentative surgery on 6/12 -MRI brain: There is strong evidence that the lobulated and enhancing 35 mm mass of the left posterior fossa. 4 additional  subcentimeter faintly enhancing brain lesions.  3 are in the cerebellum, one left thalamus  -Discussed  finding with Dr. Vertell Limber, plan for treatment to all lesions, but surgery on larger one  BPH -Continue Flomax  Hypokalemia -Resolved, continue to monitor BMP  Back pain -Aspercreme and kpad ordered  Consultants Gastroenterology, Dr. Benson Norway General surgery Oncology  Procedures  RUQ Abd [name]  Discharge Exam: Vitals:   05/26/18 0513 05/26/18 1349  BP: 121/69 128/63  Pulse: (!) 50 (!) 53  Resp: 18 18  Temp: 98 F (36.7 C) 97.9 F (36.6 C)  SpO2: 99% 98%   Patient feeling much better. Denies further nausea or vomiting. Has been able to tolerate diet. Denies chest pain, shortness of breath.    General: Well developed, well nourished, NAD, appears stated age  60: NCAT,  mucous membranes moist.  Neck: Supple  Cardiovascular: S1 S2 auscultated, no murmurs, RRR  Respiratory: Clear to auscultation bilaterally with equal chest rise  Abdomen: Soft, nontender, nondistended, + bowel sounds  Extremities: warm dry without cyanosis clubbing or edema  Neuro: AAOx3, nonfocal  Psych: Normal affect and demeanor, pleasant   Discharge Instructions Discharge Instructions    Discharge instructions   Complete by:  As directed    Patient will be discharged to home.  Patient will need to follow up with primary care provider within one week of discharge.  Follow up with radiation oncology and Dr. Vertell Limber, neurosurgery. Patient should continue medications as prescribed.  Patient should follow a heart healthy/carb modified diet.     Allergies as of 05/26/2018      Reactions   No Known Allergies       Medication List    STOP taking these medications   omega-3 acid ethyl esters 1 g capsule Commonly known as:  LOVAZA     TAKE these medications   aspirin EC 81 MG tablet Take 81 mg by mouth daily.   atorvastatin 10 MG tablet Commonly known as:  LIPITOR Take 10 mg by mouth daily  at 6 PM.   BASAGLAR KWIKPEN 100 UNIT/ML Sopn Inject 0.12 mLs (12 Units total) into the skin at bedtime. What changed:  how much to take   CO Q 10 PO Take 1 capsule by mouth daily.   dexamethasone 4 MG tablet Commonly known as:  DECADRON Take 2 tablets (8 mg total) by mouth 2 (two) times daily.   insulin aspart 100 UNIT/ML FlexPen Commonly known as:  NOVOLOG FLEXPEN Use with meals and QHS. UDJ497-026: 2u; CBG151-200: 3 u; CBG201-250: 5 u; CBG251-300: 8 u; CBG301-350: 11 u; CBG351-400: 15u   Insulin Pen Needle 31G X 5 MM Misc Use with insulin pen.   lisinopril 2.5 MG tablet Commonly known as:  PRINIVIL,ZESTRIL Take 2.5 mg by mouth daily.   methocarbamol 750 MG tablet Commonly known as:  ROBAXIN Take 1 tablet (750 mg total) by mouth every 8 (eight) hours as needed for muscle spasms.   nitroGLYCERIN 0.4 MG SL tablet Commonly known as:  NITROSTAT Place 0.4 mg under the tongue every 5 (five) minutes as needed for chest pain. CALL 911 IF PAIN PERSISTS   ondansetron 4 MG disintegrating tablet Commonly known as:  ZOFRAN-ODT Take 1 tablet (4 mg total) by mouth every 8 (eight) hours as needed for nausea or vomiting.   tamsulosin 0.4 MG Caps capsule Commonly known as:  FLOMAX Take 0.4 mg by mouth daily.   zolpidem 5 MG tablet Commonly known as:  AMBIEN Take 1 tablet (5 mg total) by mouth at bedtime as needed for sleep.      Allergies  Allergen Reactions  .  No Known Allergies    Follow-up Information    [name], MD. Schedule an appointment as soon as possible for a visit in 1 week(s).   Specialty:  Family Medicine Why:  Hospital follow up Contact information: [address] [place] [address] [phone]        Erline Levine, MD. Go to.   Specialty:  Neurosurgery Why:  Tentative surgery on 06/04/2018 Contact information: [address] [place] [address] [place] [address] [phone]        Kyung Rudd, MD. Go on 06/02/2018.   Specialty:   Radiation Oncology Contact information: [address] [place] [address] [phone]        Ladell Pier, MD. Schedule an appointment as soon as possible for a visit in 3 week(s).   Specialty:  Oncology Why:  Hospital follow up Contact information: [place] [address] [phone]            The results of significant diagnostics from this hospitalization (including imaging, microbiology, ancillary and laboratory) are listed below for reference.    Significant Diagnostic Studies: Ct Head W & Wo Contrast  Result Date: 05/23/2018 CLINICAL DATA:  Metastatic colon cancer with intractable nausea. Weakness and dizziness for 2 weeks. EXAM: CT HEAD WITHOUT AND WITH CONTRAST TECHNIQUE: Contiguous axial images were obtained from the base of the skull through the vertex without and with intravenous contrast CONTRAST:  63mL OMNIPAQUE IOHEXOL 300 MG/ML  SOLN COMPARISON:  PET-CT 11/19/2016 FINDINGS: Brain: Enhancing mass in the lateral left cerebellum measuring 3.4 cm, with extensive vasogenic edema crossing midline and narrowing the fourth ventricle. No hydrocephalus. No evidence of infarct or hemorrhage. Chronic small vessel ischemic type change in the cerebral white matter. Vascular: Visible vessels are patent. Skull: No acute finding or evidence of metastasis. Sinuses/Orbits: Negative These results were called by telephone at the time of interpretation on 05/23/2018 at 10:23 am to Dr. Betsy Coder , who verbally acknowledged these results. 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 There is fourth ventricular narrowing without hydrocephalus. Electronically Signed   By: Monte Fantasia M.D.   On: 05/23/2018 10:23   Ct Abdomen W Contrast  Result Date: 05/20/2018 CLINICAL DATA:  New onset nausea and vomiting. EXAM: CT ABDOMEN WITH CONTRAST TECHNIQUE: Multidetector CT imaging of the abdomen was performed using the standard  protocol following bolus administration of intravenous contrast. CONTRAST:  183mL ISOVUE-300 IOPAMIDOL (ISOVUE-300) INJECTION 61% COMPARISON:  CT scan 05/15/2018 FINDINGS: Lower chest: Stable pulmonary nodules since the recent CT scan. No acute findings at the lung bases. No pleural effusions. Hepatobiliary: Stable hepatic metastatic disease. No acute hepatic findings. Possible small gallstone dependently in the gallbladder. No CT findings to suggest acute cholecystitis. No common bile duct dilatation. Pancreas: No mass, inflammation or ductal dilatation. Spleen: Normal size.  No focal lesions. Adrenals/Urinary Tract: The adrenal glands and kidneys are unremarkable and stable. Stomach/Bowel: The stomach, duodenum, visualized small bowel and visualize colon are grossly normal. Stable appearance of the colonic anastomosis. Stable omental implant on image number 28. Vascular/Lymphatic: The aorta and branch vessels are stable. Scattered atherosclerotic calcifications. The major venous structures are patent. Small scattered mesenteric and retroperitoneal lymph nodes. Other: No ascites or abdominal wall hernia. Musculoskeletal: No significant bony findings. IMPRESSION: Stable CT appearance of the abdomen when compared to recent prior study. No new/acute findings. Suspect small gallstone or possible polyp in the gallbladder. No CT findings for acute cholecystitis. No biliary dilatation.  Electronically Signed   By: Marijo Sanes M.D.   On: 05/20/2018 16:19   Mr Jeri Cos GE Contrast  Result Date: 05/26/2018 CLINICAL DATA:  62 year old male with metastatic colon cancer. Left cerebellar mass with edema detected on head CT 05/23/2018. Started on IV Decadron. Treatment planning. EXAM: MRI HEAD WITHOUT AND WITH CONTRAST TECHNIQUE: Multiplanar, multiecho pulse sequences of the brain and surrounding structures were obtained without and with intravenous contrast. CONTRAST:  12mL MULTIHANCE GADOBENATE DIMEGLUMINE 529 MG/ML IV SOLN  COMPARISON:  Head CT without contrast 05/23/2018. FINDINGS: Brain: Lobulated and heterogeneously enhancing rounded mass in the left lateral posterior fossa measures up to 35 millimeters maximum diameter, approximately 31 millimeters AP. On both series 3, image 29 and coronal series 11, images 15 and 16 the mass appears to be extra-axial, and there is associated dural thickening, a possible dural tail on coronal series 13, image 17. However, the mass lacks the typical diffusion imaging for meningioma. There is patchy decreased T2* signal within the lower aspect of the lesion which might be blood products or calcification. There persists moderate to severe edema throughout the left cerebellum, and edema continues to cross midline at the level of the deep cerebellar nuclei. There is mild mass effect on the 4th ventricle which remains patent. The other basilar cisterns remain patent. However, there are 4 additional sub centimeter - 3-4 mm each - round somewhat faintly enhancing lesions identified: - right cerebellum near midline series 12 images 28 and 30. - left superior cerebellar vermis image 60. - central left thalamus image 95. Some of the right hemisphere cerebellar edema might be related to the small right cerebellar lesions, but the thalamic lesion shows no edema or mass effect. No other dural thickening. There is a chronic nonenhancing microhemorrhage in the posterior right temporal lobe on series 8, image 12. No ventriculomegaly. No restricted diffusion or evidence of acute infarction. No acute intracranial hemorrhage. Negative pituitary and cervicomedullary junction. Patchy nonspecific bilateral cerebral white matter T2 and FLAIR hyperintensity. Vascular: Major intracranial vascular flow voids are preserved, the distal right vertebral artery appears dominant. The major dural venous sinuses are enhancing and appear patent, including the left sigmoid. Skull and upper cervical spine: Negative visible cervical  spine and spinal cord. Visualized bone marrow signal is within normal limits. Sinuses/Orbits: Negative. Other: Mastoid air cells remain clear. Visible internal auditory structures appear normal. Scalp and face soft tissues appear negative. IMPRESSION: 1. There is strong evidence that the lobulated and enhancing 35 mm mass of the left posterior fossa is mostly or entirely extra-axial such that meningioma was to be considered in the differential diagnosis. However, four additional sub-centimeter faintly enhancing brain lesions are identified, and as such the constellation is most consistent with multiple cerebral metastases. Three of the smaller lesions are in the cerebellum, and one is in the left thalamus. 2. Persistent cerebellar edema. However, the 4th ventricle and basilar cisterns remain patent. 3. Study discussed by telephone with Dr. Vertell Limber on 05/26/2018 at 11:33 . Electronically Signed   By: Genevie Ann M.D.   On: 05/26/2018 11:36   Ct Abdomen Pelvis W Contrast  Result Date: 05/15/2018 CLINICAL DATA:  Restaging metastatic colon cancer. EXAM: CT ABDOMEN AND PELVIS WITH CONTRAST TECHNIQUE: Multidetector CT imaging of the abdomen and pelvis was performed using the standard protocol following bolus administration of intravenous contrast. CONTRAST:  169mL ISOVUE-300 IOPAMIDOL (ISOVUE-300) INJECTION 61% COMPARISON:  CT scan 02/25/2018 FINDINGS: Lower chest: Stable atelectasis/scarring in the lingula. New 2.5 mm pulmonary nodule  in the right lower lobe on image number 20. New 3 mm nodule in the left lower lobe on image number 11 adjacent 2 mm nodule is slightly larger and prior study. Stable 14 mm nodule just above the right hemidiaphragm. Hepatobiliary: Intra and extra parenchymal lesion involving segment 4A measures approximately 3.0 x 2.2 cm and previously measured 2.5 x 1.6 cm. There are 2 small adjacent lymph nodes, both of which have enlarged. These measure 15.5 and 10.5 mm. Segment 8 lesion laterally on image  number 10 measures 2.7 x 2.2 cm and previously measured 2.3 x 1.8 cm. There are 2 subcapsular lesions involving segment 6 of the liver. The more anterior lesion is stable measuring 21 mm and the more posterior lesion is slightly larger measuring 20 mm and previously measuring 18 mm. No new hepatic lesions. Pancreas: No mass, inflammation or ductal dilatation. Spleen: Normal size.  No focal lesions. Adrenals/Urinary Tract: The adrenal glands and kidneys are unremarkable. Stable right renal cyst. Stomach/Bowel: The stomach, duodenum and small bowel are unremarkable. Surgical changes from a partial right hemicolectomy. No findings for recurrent tumor in this area. The remainder of the colon is unremarkable. Vascular/Lymphatic: Stable atherosclerotic calcifications involving the aorta but no aneurysm or dissection. The branch vessels are patent. The major venous structures are patent. There are 3 small soft tissue lesions in the right lower quadrant in the region of the prior surgery. The most superior nodule located laterally on image number 46 measures 10.5 mm. This previously measured 6 mm. Slightly more inferiorly on image number 50 is a 9 mm nodule. This previously measured 6 mm. More inferiorly on image number 51 is an 11 mm slight necrotic appearing nodule which previously measured 9.5 mm. Small soft tissue lesion or slightly necrotic lymph node in the right upper pelvis on image number 51 measures 11 mm and previously measured 9.5 mm. This is suspicious for a small tumor implant. Reproductive: The prostate gland and seminal vesicles are unremarkable. Other: No pelvic mass or adenopathy. No free pelvic fluid collections. No inguinal mass or adenopathy. No abdominal wall hernia or subcutaneous lesions. Musculoskeletal: No significant bony findings. IMPRESSION: 1. A few small but new pulmonary nodules at the lung bases. 2. Stable and slightly larger hepatic metastatic lesions. No new lesions. 3. Interval increase  in size of 3 soft tissue implants in the right lower quadrant as detailed above. Electronically Signed   By: Marijo Sanes M.D.   On: 05/15/2018 14:21   [name] Ekg Site Rite  Result Date: 05/23/2018 If Site Rite image not attached, placement could not be confirmed due to current cardiac rhythm.  US Abdomen Limited Ruq  Result Date: 05/21/2018 CLINICAL DATA:  Gallstone or polyp noted on recent CT. Metastatic colon cancer. EXAM: ULTRASOUND ABDOMEN LIMITED RIGHT UPPER QUADRANT COMPARISON:  CT from previous day FINDINGS: Gallbladder: No gallstones or wall thickening visualized. No polyp identified. No sonographic Murphy sign noted by sonographer. Common bile duct: Diameter: 5.1 mm, unremarkable Liver: 2 hypoechoic right lobe lesions 1 measuring 2.7 x 2.5 cm, the other 3.8 x 3.7 x 2.7 cm. Background parenchyma unremarkable. No biliary ductal dilatation. Portal vein is patent on color Doppler imaging with normal direction of blood flow towards the liver. IMPRESSION: 1. Normal gallbladder. 2. Liver metastases as previously demonstrated on CT. Electronically Signed   By: Lucrezia Europe M.D.   On: 05/21/2018 17:31    Microbiology: Recent Results (from the past 240 hour(s))  Gastrointestinal Panel by PCR , Stool  Status: None   Collection Time: 05/21/18 11:09 AM  Result Value Ref Range Status   Campylobacter species NOT DETECTED NOT DETECTED Final   Plesimonas shigelloides NOT DETECTED NOT DETECTED Final   Salmonella species NOT DETECTED NOT DETECTED Final   Yersinia enterocolitica NOT DETECTED NOT DETECTED Final   Vibrio species NOT DETECTED NOT DETECTED Final   Vibrio cholerae NOT DETECTED NOT DETECTED Final   Enteroaggregative E coli (EAEC) NOT DETECTED NOT DETECTED Final   Enteropathogenic E coli (EPEC) NOT DETECTED NOT DETECTED Final   Enterotoxigenic E coli (ETEC) NOT DETECTED NOT DETECTED Final   Shiga like toxin producing E coli (STEC) NOT DETECTED NOT DETECTED Final   Shigella/Enteroinvasive E  coli (EIEC) NOT DETECTED NOT DETECTED Final   Cryptosporidium NOT DETECTED NOT DETECTED Final   Cyclospora cayetanensis NOT DETECTED NOT DETECTED Final   Entamoeba histolytica NOT DETECTED NOT DETECTED Final   Giardia lamblia NOT DETECTED NOT DETECTED Final   Adenovirus F40/41 NOT DETECTED NOT DETECTED Final   Astrovirus NOT DETECTED NOT DETECTED Final   Norovirus GI/GII NOT DETECTED NOT DETECTED Final   Rotavirus A NOT DETECTED NOT DETECTED Final   Sapovirus (I, II, IV, and V) NOT DETECTED NOT DETECTED Final    Comment: Performed at [facility], [address] [place], [place] [address]     Labs: Basic Metabolic Panel: Recent Labs  Lab 05/21/18 0359 05/22/18 0331 05/23/18 0324 05/25/18 0455 05/26/18 0518  NA 140 142 141 141 140  K 3.5 3.3* 3.5 4.1 4.0  CL 108 109 107 111 111  CO2 25 23 24  21* 21*  GLUCOSE 127* 106* 94 239* 247*  BUN 7 7 7 15 12   CREATININE 0.72 0.67 0.65 0.75 0.64  CALCIUM 8.3* 8.5* 8.5* 8.2* 8.0*  MG 2.3  --  2.2  --   --   PHOS 3.5  --  4.0  --   --    Liver Function Tests: Recent Labs  Lab 05/20/18 0418 05/21/18 0359 05/22/18 0331 05/23/18 0324 05/25/18 0455  AST 15 15 16 15 31   ALT 14* 12* 12* 14* 18  ALKPHOS 224* 192* 204* 190* 251*  BILITOT 0.9 0.6 0.8 1.0 0.5  PROT 6.6 6.1* 6.3* 6.3* 5.6*  ALBUMIN 3.4* 3.1* 3.4* 3.4* 3.2*   Recent Labs  Lab 05/19/18 1821 05/20/18 0418 05/21/18 0359 05/22/18 0331 05/23/18 0324  LIPASE 581* 179* 272* 119* 56*   No results for input(s): AMMONIA in the last 168 hours. CBC: Recent Labs  Lab 05/19/18 1821 05/20/18 0418 05/21/18 0359 05/26/18 0518  WBC 8.6 6.1 5.1 11.8*  NEUTROABS 6.4  --  3.1  --   HGB 16.1 13.5 12.7* 12.2*  HCT 46.7 41.5 39.5 37.3*  MCV 85.8 88.1 87.4 87.4  PLT 251 211 205 203   Cardiac Enzymes: Recent Labs  Lab 05/19/18 1821  TROPONINI <0.03   BNP: BNP (last 3 results) No results for input(s): BNP in the last 8760 hours.  ProBNP (last 3 results) No  results for input(s): PROBNP in the last 8760 hours.  CBG: Recent Labs  Lab 05/25/18 1200 05/25/18 1643 05/25/18 1938 05/26/18 0720 05/26/18 1203  GLUCAP 364* 361* 306* 240* 326*       Signed:  Glenetta Kiger  [facility] 05/26/2018, 1:56 PM

## 2018-05-27 ENCOUNTER — Ambulatory Visit
Admission: RE | Admit: 2018-05-27 | Discharge: 2018-05-27 | Disposition: A | Discharge status: Home / Self Care | Payer: 59 | Source: Ambulatory Visit | Attending: Radiation Oncology | Admitting: Radiation Oncology

## 2018-05-27 DIAGNOSIS — C7931 Secondary malignant neoplasm of brain: Secondary | ICD-10-CM | POA: Insufficient documentation

## 2018-05-27 DIAGNOSIS — Z510 Encounter for antineoplastic radiation therapy: Secondary | ICD-10-CM | POA: Insufficient documentation

## 2018-05-27 DIAGNOSIS — C7802 Secondary malignant neoplasm of left lung: Secondary | ICD-10-CM | POA: Diagnosis not present

## 2018-05-29 ENCOUNTER — Other Ambulatory Visit: Payer: Self-pay | Admitting: *Deleted

## 2018-05-29 DIAGNOSIS — C7802 Secondary malignant neoplasm of left lung: Secondary | ICD-10-CM | POA: Diagnosis not present

## 2018-05-29 DIAGNOSIS — Z510 Encounter for antineoplastic radiation therapy: Secondary | ICD-10-CM | POA: Diagnosis not present

## 2018-05-29 DIAGNOSIS — C7931 Secondary malignant neoplasm of brain: Secondary | ICD-10-CM | POA: Diagnosis not present

## 2018-05-29 NOTE — Patient Outreach (Signed)
[facility]) Care Management  05/29/2018  Allen Glenn 1956-01-23 [id number]   Sent urgent message in the [facility] diabetes digital assistant platform requesting [name] send return message for convenient time to call him today to complete the transition of care assessment. He was inpatient at [facility] from 5/27-6/3 for nausea and vomiting, pancreatitis and metastatic colon cancer with liver metastasis and newly discovered left cerebral metastasis. He was discharged home to the care of his wife on 6/3, Lantus was increased from 10 to 12 units daily and he was discharged on novolog sliding scale for blood sugars greater than 120 premeal and hs.Decadron was also added to his medication regimen.  Await response from [facility] in [place] regarding timing of transition of care phone call assessment. Barrington Ellison RN,CCM,CDE [place] Management Coordinator Office Phone [phone] Office Fax [phone]

## 2018-05-29 NOTE — Pre-Procedure Instructions (Signed)
Allen Glenn  05/29/2018      [place], [place] - [address] [place] [address] Phone: [phone] Fax: [phone]    Your procedure is scheduled on June 04, 2018.  Report to [facility] Admitting at 630 AM.  Call this number if you have problems the morning of surgery:  [phone]   Remember:  No food or drink after midnight.  Continue all medications as directed by your physician except follow these medication instructions before surgery below    Take these medicines the morning of surgery with A SIP OF WATER  Tylenol-If needed Dexamethasone (decadron) Methocarbamol (roboxin) tamsulosin (flomax) nitrogylcerin (nitrostat) -if needed for chest pain  Follow your surgeon's instructions on when to stop/hold aspirin 81 mg  7 days prior to surgery STOP taking any Aleve, Naproxen, Ibuprofen, Motrin, Advil, Goody's, BC's, all herbal medications, fish oil, and all vitamins   WHAT DO I DO ABOUT MY DIABETES MEDICATION?  Marland Kitchen Do not take oral diabetes medicines (pills) the morning of surgery.  . THE NIGHT BEFORE SURGERY, take your normal dose of humalog insulin with dinner and 6 units of insulin glargine.      . THE MORNING OF SURGERY, take 6 units of insulin glargine and no humalog insulin unless your CBG is greater than 220 mg/dL, then you may take  of your sliding scale (correction) dose of insulin.  . The day of surgery, do not take other diabetes injectables, including Byetta (exenatide), Bydureon (exenatide ER), Victoza (liraglutide), or Trulicity (dulaglutide).   Reviewed and Endorsed by [facility] Patient Education Committee, August 2015   How to Manage Your Diabetes Before and After Surgery  Why is it important to control my blood sugar before and after surgery? . Improving blood sugar levels before and after surgery helps healing and can limit problems. . A way of improving blood sugar  control is eating a healthy diet by: o  Eating less sugar and carbohydrates o  Increasing activity/exercise o  Talking with your doctor about reaching your blood sugar goals . High blood sugars (greater than 180 mg/dL) can raise your risk of infections and slow your recovery, so you will need to focus on controlling your diabetes during the weeks before surgery. . Make sure that the doctor who takes care of your diabetes knows about your planned surgery including the date and location.  How do I manage my blood sugar before surgery? . Check your blood sugar at least 4 times a day, starting 2 days before surgery, to make sure that the level is not too high or low. o Check your blood sugar the morning of your surgery when you wake up and every 2 hours until you get to the Short Stay unit. . If your blood sugar is less than 70 mg/dL, you will need to treat for low blood sugar: o Do not take insulin. o Treat a low blood sugar (less than 70 mg/dL) with  cup of clear juice (cranberry or apple), 4 glucose tablets, OR glucose gel. Recheck blood sugar in 15 minutes after treatment (to make sure it is greater than 70 mg/dL). If your blood sugar is not greater than 70 mg/dL on recheck, call [phone] o  for further instructions. . Report your blood sugar to the short stay nurse when you get to Short Stay.  . If you are admitted to the hospital after surgery: o Your blood sugar will be  checked by the staff and you will probably be given insulin after surgery (instead of oral diabetes medicines) to make sure you have good blood sugar levels. o The goal for blood sugar control after surgery is 80-180 mg/dL.   Do not wear jewelry.  Do not wear lotions, powders, or perfumes, or deodorant.  Men may shave face and neck.  Do not bring valuables to the hospital.  [facility] is not responsible for any belongings or valuables.  Contacts, dentures or bridgework may not be worn into surgery.  Leave your  suitcase in the car.  After surgery it may be brought to your room.  For patients admitted to the hospital, discharge time will be determined by your treatment team.  Patients discharged the day of surgery will not be allowed to drive home.    [facility]- Preparing For Surgery  Before surgery, you can play an important role. Because skin is not sterile, your skin needs to be as free of germs as possible. You can reduce the number of germs on your skin by washing with CHG (chlorahexidine gluconate) Soap before surgery.  CHG is an antiseptic cleaner which kills germs and bonds with the skin to continue killing germs even after washing.    Oral Hygiene is also important to reduce your risk of infection.  Remember - BRUSH YOUR TEETH THE MORNING OF SURGERY WITH YOUR REGULAR TOOTHPASTE  Please do not use if you have an allergy to CHG or antibacterial soaps. If your skin becomes reddened/irritated stop using the CHG.  Do not shave (including legs and underarms) for at least 48 hours prior to first CHG shower. It is OK to shave your face.  Please follow these instructions carefully.   1. Shower the NIGHT BEFORE SURGERY and the MORNING OF SURGERY with CHG.   2. If you chose to wash your hair, wash your hair first as usual with your normal shampoo.  3. After you shampoo, rinse your hair and body thoroughly to remove the shampoo.  4. Use CHG as you would any other liquid soap. You can apply CHG directly to the skin and wash gently with a scrungie or a clean washcloth.   5. Apply the CHG Soap to your body ONLY FROM THE NECK DOWN.  Do not use on open wounds or open sores. Avoid contact with your eyes, ears, mouth and genitals (private parts). Wash Face and genitals (private parts)  with your normal soap.  6. Wash thoroughly, paying special attention to the area where your surgery will be performed.  7. Thoroughly rinse your body with warm water from the neck down.  8. DO NOT shower/wash with your  normal soap after using and rinsing off the CHG Soap.  9. Pat yourself dry with a CLEAN TOWEL.  10. Wear CLEAN PAJAMAS to bed the night before surgery, wear comfortable clothes the morning of surgery  11. Place CLEAN SHEETS on your bed the night of your first shower and DO NOT SLEEP WITH PETS.  Day of Surgery:  Do not apply any deodorants/lotions.  Please wear clean clothes to the hospital/surgery center.   Remember to brush your teeth WITH YOUR REGULAR TOOTHPASTE.   Please read over the following fact sheets that you were given. Pain Booklet, Coughing and Deep Breathing and Surgical Site Infection Prevention

## 2018-05-30 ENCOUNTER — Encounter (HOSPITAL_COMMUNITY)
Admission: RE | Admit: 2018-05-30 | Discharge: 2018-05-30 | Disposition: A | Discharge status: Home / Self Care | Payer: 59 | Source: Ambulatory Visit | Attending: Neurosurgery | Admitting: Neurosurgery

## 2018-05-30 ENCOUNTER — Encounter (HOSPITAL_COMMUNITY): Payer: Self-pay

## 2018-05-30 DIAGNOSIS — Z01812 Encounter for preprocedural laboratory examination: Secondary | ICD-10-CM | POA: Insufficient documentation

## 2018-05-30 HISTORY — DX: Type 2 diabetes mellitus without complications: E11.9

## 2018-05-30 HISTORY — DX: Acute pancreatitis without necrosis or infection, unspecified: K85.90

## 2018-05-30 LAB — TYPE AND SCREEN
ABO/RH(D): A NEG
Antibody Screen: NEGATIVE

## 2018-05-30 LAB — HEMOGLOBIN A1C
Hgb A1c MFr Bld: 7.9 % — ABNORMAL HIGH (ref 4.8–5.6)
MEAN PLASMA GLUCOSE: 180.03 mg/dL

## 2018-05-30 LAB — GLUCOSE, CAPILLARY: Glucose-Capillary: 237 mg/dL — ABNORMAL HIGH (ref 65–99)

## 2018-05-30 NOTE — Progress Notes (Signed)
Cardiologist Dr. Karlene Lineman visit 09-10-17  ECHO  2014  Cardiac cath 2014  Pt. Discharged from [facility] on Monday 05-26-2018  Has been on steroids in hospital and since being discharged. Wife state sugars started running high after being placed on steroids so metformin stopped and pt. Started on insulin.  Pt. Wife is a diabetes Warehouse manager at [employer].

## 2018-05-30 NOTE — Progress Notes (Signed)
Anesthesia Chart Review:   Case:  [id number] Date/Time:  06/04/18 0815   Procedures:      CRANIOTOMY TUMOR EXCISION with Brainlab (N/A ) - CRANIOTOMY TUMOR EXCISION with Brainlab     APPLICATION OF CRANIAL NAVIGATION (N/A )   Anesthesia type:  General   Pre-op diagnosis:  brain tumor   Location:  [facility] OR ROOM 21 / [place] [facility] OR   Surgeon:  Erline Levine, MD      DISCUSSION: - Pt is a 62 year old male with hx CAD (s/p DES to LAD 2014), metastatic colon cancer, HTN, DM  - Pt hospitalized 5/27-05/26/18 for persistent N/V. Head CT showed metastasis with extensive edema; decadron started, surgery planned  VS: BP (!) 129/58   Pulse 68   Temp 36.6 C   Resp 18   Ht 6\' 2"  (1.88 m)   Wt 208 lb 14.4 oz (94.8 kg)   SpO2 100%   BMI 26.82 kg/m    PROVIDERS: PCP is [name], MD   Patient Care Team: Vladimir Creeks, MD as Consulting Physician (Gastroenterology) Cheryll Cockayne, MD as Consulting Physician (Colon and Rectal Surgery) Patsy Baltimore, MD as Consulting Physician (Surgical Oncology) [name], MD as Consulting Physician (Cardiothoracic Surgery) [name], MD as Consulting Physician (Cardiology)  - Last office visit 09/10/17  Oncologist is Betsy Coder, MD   LABS: Labs reviewed: Acceptable for surgery.  - CBC and BMP 05/26/18 from hospitalization reviewed and acceptable for surgery  (all labs ordered are listed, but only abnormal results are displayed)  Labs Reviewed  GLUCOSE, CAPILLARY - Abnormal; Notable for the following components:      Result Value   Glucose-Capillary 237 (*)    All other components within normal limits  HEMOGLOBIN A1C - Abnormal; Notable for the following components:   Hgb A1c MFr Bld 7.9 (*)    All other components within normal limits  TYPE AND SCREEN     IMAGES:  CT chest 02/25/18:  1. Interval increase in size of bilateral pulmonary nodules concerning for metastatic disease. 2. Interval increase in size of capsular based  hepatic metastatic disease. 3. Mild interval increase in size of right lower quadrant mesenteric lymph node. Recommend attention on follow-up. 4. Cholelithiasis.   EKG 09/10/17: NSR   CV:  Nuclear stress test 12/15/14 (care everywhere):  1. Negative stress Cardiolite without ischemia. 2. Normal ejection fraction. 3. No wall motion abnormalities noted.  Echo 06/28/13:  - LV size is normal. Normal LV wall thickness. LV systolic function is normal. LV ejection fraction = 55%. LV filling pattern is normal. Very mild distal anteroseptal and aplical hypokinesis. - RV normal in size and function. - IVC size was mildly dilated. - There is no pericardial effusion. - There is no comparison study available.  Cardiac cath 06/27/13:  1.  Left main: Distal 20% stenosis. 2.  Ostial LAD 30%; proximal LAD 98% (s/p DES to proximal LAD); mid LAD 60% stenosis. 3.  Ostial circumflex 30% stenosis 4.  Ostial RCA 30% stenosis 5. EF 35%   Past Medical History:  Diagnosis Date  . Acute MI ([place]) 2014   acute ST elevation MI  . Anemia   . Colon cancer ([place]) 2016   Status post resection of colon mass is well as he panic metastasis.   . Coronary artery disease   . Diabetes mellitus without complication ([place])   . Enlarged prostate    slightly and takes Flomax daily  . Heart disease   . History  of blood transfusion   . Hyperlipidemia    takes Lipitor daily  . Hypertension    takes Lisinopril daily  . Lung nodule    left  . Nocturia   . Numbness    left foot  . Pancreatitis   . Peripheral neuropathy     Past Surgical History:  Procedure Laterality Date  . 1/8 of liver removed    . APPENDECTOMY    . BIOPSY  05/22/2018   Procedure: BIOPSY;  Surgeon: Carol Ada, MD;  Location: [facility] ENDOSCOPY;  Service: Endoscopy;;  . COLONOSCOPY    . CORONARY ANGIOPLASTY     1 stent  . CORONARY STENT PLACEMENT  06-27-2013  . ESOPHAGOGASTRODUODENOSCOPY Left 05/22/2018   Procedure: ESOPHAGOGASTRODUODENOSCOPY  (EGD);  Surgeon: Carol Ada, MD;  Location: [facility];  Service: Endoscopy;  Laterality: Left;  . HERNIA REPAIR Left 1991  . LAPAROSCOPIC RIGHT COLECTOMY  2016   [place]  . LIVER LOBECTOMY Right 09/28/2015    Right partial hepate[facility] at [facility]  . LUNG BIOPSY Left 11/28/2016   Procedure: LUNG BIOPSY, left upper lobe;  Surgeon: [name], MD;  Location: [facility];  Service: Thoracic;  Laterality: Left;  Marland Kitchen VIDEO BRONCHOSCOPY WITH ENDOBRONCHIAL NAVIGATION N/A 11/28/2016   Procedure: VIDEO BRONCHOSCOPY WITH ENDOBRONCHIAL NAVIGATION;  Surgeon: [name], MD;  Location: [facility];  Service: Thoracic;  Laterality: N/A;  . VIDEO BRONCHOSCOPY WITH ENDOBRONCHIAL ULTRASOUND N/A 11/28/2016   Procedure: VIDEO BRONCHOSCOPY WITH ENDOBRONCHIAL ULTRASOUND;  Surgeon: [name], MD;  Location: [facility] OR;  Service: Thoracic;  Laterality: N/A;    MEDICATIONS: . acetaminophen (TYLENOL) 500 MG tablet  . aspirin EC 81 MG tablet  . atorvastatin (LIPITOR) 10 MG tablet  . Coenzyme Q10 (COQ10) 200 MG CAPS  . dexamethasone (DECADRON) 4 MG tablet  . HUMALOG KWIKPEN 100 UNIT/ML KiwkPen  . Insulin Glargine (BASAGLAR KWIKPEN) 100 UNIT/ML SOPN  . Insulin Pen Needle 31G X 5 MM MISC  . lisinopril (PRINIVIL,ZESTRIL) 2.5 MG tablet  . methocarbamol (ROBAXIN) 750 MG tablet  . nitroGLYCERIN (NITROSTAT) 0.4 MG SL tablet  . ondansetron (ZOFRAN-ODT) 4 MG disintegrating tablet  . tamsulosin (FLOMAX) 0.4 MG CAPS capsule  . trolamine salicylate (ASPERCREME) 10 % cream  . zolpidem (AMBIEN) 5 MG tablet   No current facility-administered medications for this encounter.     If no changes, I anticipate pt can proceed with surgery as scheduled.   Willeen Cass, FNP-BC [name] Short Stay Surgical Center/Anesthesiology Phone: [phone] 05/30/2018 4:19 PM

## 2018-06-02 ENCOUNTER — Ambulatory Visit
Admission: RE | Admit: 2018-06-02 | Discharge: 2018-06-02 | Disposition: A | Discharge status: Home / Self Care | Payer: 59 | Source: Ambulatory Visit | Attending: Radiation Oncology | Admitting: Radiation Oncology

## 2018-06-02 ENCOUNTER — Other Ambulatory Visit: Payer: Self-pay | Admitting: *Deleted

## 2018-06-02 DIAGNOSIS — C7931 Secondary malignant neoplasm of brain: Secondary | ICD-10-CM | POA: Diagnosis not present

## 2018-06-02 DIAGNOSIS — Z510 Encounter for antineoplastic radiation therapy: Secondary | ICD-10-CM | POA: Diagnosis not present

## 2018-06-02 DIAGNOSIS — C7802 Secondary malignant neoplasm of left lung: Secondary | ICD-10-CM | POA: Diagnosis not present

## 2018-06-02 NOTE — Op Note (Signed)
  Name: Allen Glenn  MRN: [id number]  Date: 06/02/2018   DOB: 03/30/1956  Stereotactic Radiosurgery Operative Note  PRE-OPERATIVE DIAGNOSIS:  Multiple Brain Metastases  POST-OPERATIVE DIAGNOSIS:  Multiple Brain Metastases  PROCEDURE:  Stereotactic Radiosurgery  SURGEON:  Peggyann Shoals, MD  NARRATIVE: The patient underwent a radiation treatment planning session in the radiation oncology simulation suite under the care of the radiation oncology physician and physicist.  I participated closely in the radiation treatment planning afterwards. The patient underwent planning CT which was fused to 3T high resolution MRI with 1 mm axial slices.  These images were fused on the planning system.  We contoured the gross target volumes and subsequently expanded this to yield the Planning Target Volume. I actively participated in the planning process.  I helped to define and review the target contours and also the contours of the optic pathway, eyes, brainstem and selected nearby organs at risk.  All the dose constraints for critical structures were reviewed and compared to AAPM Task Group 101.  The prescription dose conformity was reviewed.  I approved the plan electronically.    Accordingly, Allen Glenn was brought to the TrueBeam stereotactic radiation treatment linac and placed in the custom immobilization mask.  The patient was aligned according to the IR fiducial markers with BrainLab Exactrac, then orthogonal x-rays were used in ExacTrac with the 6DOF robotic table and the shifts were made to align the patient  Allen Glenn received stereotactic radiosurgery uneventfully.    Lesions treated:  5   Complex lesions treated:  0 (>3.5 cm, <58mm of optic path, or within the brainstem)   The detailed description of the procedure is recorded in the radiation oncology procedure note.  I was present for the duration of the procedure.  DISPOSITION:  Following delivery, the patient was transported to nursing in  stable condition and monitored for possible acute effects to be discharged to home in stable condition with follow-up in one month.  Peggyann Shoals, MD 06/02/2018 8:30 AM

## 2018-06-02 NOTE — Progress Notes (Signed)
  Radiation Oncology         [phone] ________________________________  Stereotactic Treatment Procedure Note  Name: Allen Glenn MRN: [id number]  Date: 06/02/2018  DOB: 1956/08/29  SPECIAL TREATMENT PROCEDURE    ICD-10-CM   1. Brain metastases ([place]) C79.31     3D TREATMENT PLANNING AND DOSIMETRY:  The patient's radiation plan was reviewed and approved by neurosurgery and radiation oncology prior to treatment.  It showed 3-dimensional radiation distributions overlaid onto the planning CT/MRI image set.  The [facility] for the target structures as well as the organs at risk were reviewed. The documentation of the 3D plan and dosimetry are filed in the radiation oncology EMR.  NARRATIVE:  Allen Glenn was brought to the TrueBeam stereotactic radiation treatment machine and placed supine on the CT couch. The head frame was applied, and the patient was set up for stereotactic radiosurgery.  Neurosurgery was present for the set-up and delivery  SIMULATION VERIFICATION:  In the couch zero-angle position, the patient underwent Exactrac imaging using the Brainlab system with orthogonal KV images.  These were carefully aligned and repeated to confirm treatment position for each of the isocenters.  The Exactrac snap film verification was repeated at each couch angle.  PROCEDURE: Allen Glenn received stereotactic radiosurgery to the following targets: Five distinct brain metastases targets were treated using 5 Rapid Arc VMAT Beams to a prescription dose of 14 and 20 Gy.  ExacTrac registration was performed for each couch angle.  The 100% isodose line was prescribed.  6 MV X-rays were delivered in the flattening filter free beam mode.  STEREOTACTIC TREATMENT MANAGEMENT:  Following delivery, the patient was transported to nursing in stable condition and monitored for possible acute effects.  Vital signs were recorded BP 111/64   Pulse (!) 58   Temp 98.6 F (37 C) (Oral)   Resp 20   SpO2 100% . The  patient tolerated treatment without significant acute effects, and was discharged to home in stable condition.    PLAN: The largest left cerebellar met was treated to 14 Gy to be followed by surgical removal on Wednesday.  Other smaller mets were treated to 20 Gy definitively.  Follow-up in one month.  ________________________________  Sheral Apley. Tammi Klippel, M.D.

## 2018-06-02 NOTE — Progress Notes (Signed)
Allen Glenn rested with [name] for 30 minutes following his SRS treatment. Patient denies headache, dizziness, nausea, diplopia or ringing in the ears. Denies fatigue. Vitals remained stable.  Patient without complaints. Understands to avoid strenuous activity for the next 24 hours and call [phone] with needs.  Patient received his steroid instructions from his neurosurgeon.  Patient preparing for surgery on Wednesday.  [name], RN

## 2018-06-02 NOTE — Patient Outreach (Signed)
[facility]) Care Management  06/02/2018  Allen Glenn [birth year] [id number]   Received the following return message from patient in the [facility] digital platform:  I'm having radiation today and neurosurgery Wednesday for a brain tumor. This was found during hospital stay.  Surgeon expects no deficit and success    Dionne Bucy Monday, June 02, 2018 8:43 AM    Sent return message in [place] thanking patient for his feedback and wishing him an uneventful recovery and advising him  to contact this RNCM for any needs.  Barrington Ellison RN,CCM,CDE [facility] Management Coordinator Office Phone [phone] Office Fax [phone]

## 2018-06-03 NOTE — H&P (Signed)
Reason for Consult:cerebellar metastasis Referring Physician: [name] is an 62 y.o. male.  HPI: Allen Glenn a 63 y.o.malewithhistory of metastatic colon cancer with slow progression being followed by Dr.Sherill, oncologist, CAD status post stenting presents to the ER admits in [facility] with complaints of persistent nausea vomiting. Patient was diagnosed with pancreatitis with elevated lipase by patient's primary care physician last week. Also had a subsequent CAT scan done which showed progression of his metastasis. Since then patient has been having progressive nausea vomiting and weakness. Presented to the ER admits in [facility]. Denies any chest pain or shortness of breath fever or chills.  Interim history Admitted for persistent nausea and vomiting. Thought to have pancreatitis however this was ruled out as patient had no gallstones noted on imaging. Lipase has improved. CT head was obtained which did show metastasis which could be the cause of patient's persistent nausea and vomiting. He has been started on IV Decadron. Radiation oncology also consulted.  Patient is an established patient of Dr. Lisbeth Renshaw, who saw him at the end of the week.  I spoke with [name] and went to see the patient and his wife this morning after discussing his case at our multi-disciplinary brain tumor conference this morning.   His head CT shows an enhancing lesion, most consistent with a metastasis from his colon cancer, in the left cerebellar hemisphere, measuring 3.4 cm in diameter with peri-tumoral edema and effacement of the 4th ventricle.  There is no hydrocephalus or suggestion of any other metastatic lesions.  Since starting on decadron, the patient has had resolution of headache and nausea.  He was having gait incoordination (falling to the left) and coordination problems using his left side and this has also improved.  His CBGs have been elevated and this is being  corrected with insulin.  His wife is a nurse who specializes in diabetes care and feels very comfortable managing his sliding scale with insulin coverage at home.        Past Medical History:  Diagnosis Date  . Acute MI ([place]) 2014   acute ST elevation MI  . Anemia   . Colon cancer ([place]) 06/13/2016   Status post resection of colon mass is well as he panic metastasis.   . Coronary artery disease   . Enlarged prostate    slightly and takes Flomax daily  . Heart disease   . History of blood transfusion   . Hyperlipidemia    takes Lipitor daily  . Hypertension    takes Lisinopril daily  . Lung nodule    left  . Nocturia   . Numbness    left foot  . Peripheral neuropathy          Past Surgical History:  Procedure Laterality Date  . 1/8 of liver removed    . APPENDECTOMY    . COLONOSCOPY    . CORONARY ANGIOPLASTY     1 stent  . CORONARY STENT PLACEMENT  06-27-2013  . HERNIA REPAIR Left 1991  . LAPAROSCOPIC RIGHT COLECTOMY  2016   [place]  . LIVER LOBECTOMY Right 09/28/2015    Right partial hepate[facility] at [facility]  . LUNG BIOPSY Left 11/28/2016   Procedure: LUNG BIOPSY, left upper lobe;  Surgeon: [name], MD;  Location: [facility];  Service: Thoracic;  Laterality: Left;  Marland Kitchen VIDEO BRONCHOSCOPY WITH ENDOBRONCHIAL NAVIGATION N/A 11/28/2016   Procedure: VIDEO BRONCHOSCOPY WITH ENDOBRONCHIAL NAVIGATION;  Surgeon: [name], MD;  Location: [place];  Service: Thoracic;  Laterality: N/A;  . VIDEO BRONCHOSCOPY WITH ENDOBRONCHIAL ULTRASOUND N/A 11/28/2016   Procedure: VIDEO BRONCHOSCOPY WITH ENDOBRONCHIAL ULTRASOUND;  Surgeon: [name], MD;  Location: [facility];  Service: Thoracic;  Laterality: N/A;         Family History  Problem Relation Age of Onset  . Hypertension Mother   . Heart disease Mother   . Hypertension Father   . Heart disease Father   . Neuropathy Neg Hx     Social History:  reports that he has never  smoked. He has never used smokeless tobacco. He reports that he does not drink alcohol or use drugs.  Allergies:      Allergies  Allergen Reactions  . No Known Allergies     Medications: I have reviewed the patient's current medications.  LabResultsLast48Hours  Results for orders placed or performed during the hospital encounter of 05/19/18 (from the past 48 hour(s))  Glucose, capillary     Status: Abnormal   Collection Time: 05/24/18 11:46 AM  Result Value Ref Range   Glucose-Capillary 357 (H) 65 - 99 mg/dL   Comment 1 Notify RN    Comment 2 Document in Chart   Glucose, capillary     Status: Abnormal   Collection Time: 05/24/18  5:01 PM  Result Value Ref Range   Glucose-Capillary 312 (H) 65 - 99 mg/dL   Comment 1 Notify RN    Comment 2 Document in Chart   Glucose, capillary     Status: Abnormal   Collection Time: 05/24/18 10:01 PM  Result Value Ref Range   Glucose-Capillary 281 (H) 65 - 99 mg/dL   Comment 1 Notify RN   Comprehensive metabolic panel     Status: Abnormal   Collection Time: 05/25/18  4:55 AM  Result Value Ref Range   Sodium 141 135 - 145 mmol/L   Potassium 4.1 3.5 - 5.1 mmol/L   Chloride 111 101 - 111 mmol/L   CO2 21 (L) 22 - 32 mmol/L   Glucose, Bld 239 (H) 65 - 99 mg/dL   BUN 15 6 - 20 mg/dL   Creatinine, Ser 0.75 0.61 - 1.24 mg/dL   Calcium 8.2 (L) 8.9 - 10.3 mg/dL   Total Protein 5.6 (L) 6.5 - 8.1 g/dL   Albumin 3.2 (L) 3.5 - 5.0 g/dL   AST 31 15 - 41 U/L   ALT 18 17 - 63 U/L   Alkaline Phosphatase 251 (H) 38 - 126 U/L   Total Bilirubin 0.5 0.3 - 1.2 mg/dL   GFR calc non Af Amer >60 >60 mL/min   GFR calc Af Amer >60 >60 mL/min    Comment: (NOTE) The eGFR has been calculated using the CKD EPI equation. This calculation has not been validated in all clinical situations. eGFR's persistently <60 mL/min signify possible Chronic Kidney Disease.    Anion gap 9 5 - 15    Comment: Performed at [facility], [place] [address] [place], [place] [address]  Glucose, capillary     Status: Abnormal   Collection Time: 05/25/18  7:56 AM  Result Value Ref Range   Glucose-Capillary 199 (H) 65 - 99 mg/dL   Comment 1 Notify RN    Comment 2 Document in Chart   Glucose, capillary     Status: Abnormal   Collection Time: 05/25/18 12:00 PM  Result Value Ref Range   Glucose-Capillary 364 (H) 65 - 99 mg/dL   Comment 1 Notify RN    Comment  2 Document in Chart   Glucose, capillary     Status: Abnormal   Collection Time: 05/25/18  4:43 PM  Result Value Ref Range   Glucose-Capillary 361 (H) 65 - 99 mg/dL  Glucose, capillary     Status: Abnormal   Collection Time: 05/25/18  7:38 PM  Result Value Ref Range   Glucose-Capillary 306 (H) 65 - 99 mg/dL   Comment 1 Notify RN    Comment 2 Document in Chart   CBC     Status: Abnormal   Collection Time: 05/26/18  5:18 AM  Result Value Ref Range   WBC 11.8 (H) 4.0 - 10.5 K/uL   RBC 4.27 4.22 - 5.81 MIL/uL   Hemoglobin 12.2 (L) 13.0 - 17.0 g/dL   HCT 37.3 (L) 39.0 - 52.0 %   MCV 87.4 78.0 - 100.0 fL   MCH 28.6 26.0 - 34.0 pg   MCHC 32.7 30.0 - 36.0 g/dL   RDW 13.2 11.5 - 15.5 %   Platelets 203 150 - 400 K/uL    Comment: Performed at [facility], [place] [address], [place], [place] [address]  Basic metabolic panel     Status: Abnormal   Collection Time: 05/26/18  5:18 AM  Result Value Ref Range   Sodium 140 135 - 145 mmol/L   Potassium 4.0 3.5 - 5.1 mmol/L   Chloride 111 101 - 111 mmol/L   CO2 21 (L) 22 - 32 mmol/L   Glucose, Bld 247 (H) 65 - 99 mg/dL   BUN 12 6 - 20 mg/dL   Creatinine, Ser 0.64 0.61 - 1.24 mg/dL   Calcium 8.0 (L) 8.9 - 10.3 mg/dL   GFR calc non Af Amer >60 >60 mL/min   GFR calc Af Amer >60 >60 mL/min    Comment: (NOTE) The eGFR has been calculated using the CKD EPI equation. This calculation has not been validated in all clinical situations. eGFR's  persistently <60 mL/min signify possible Chronic Kidney Disease.    Anion gap 8 5 - 15    Comment: Performed at [facility], [place] [address], [place], [place] [address]  Glucose, capillary     Status: Abnormal   Collection Time: 05/26/18  7:20 AM  Result Value Ref Range   Glucose-Capillary 240 (H) 65 - 99 mg/dL   Comment 1 Notify RN    Comment 2 Document in Chart       ImagingResults(Last48hours)  No results found.    Review of Systems - Negative except headache, nausea, left and right shoulder pain and muscular discomfort, all improved on steroids    Blood pressure 121/69, pulse (!) 50, temperature 98 F (36.7 C), temperature source Oral, resp. rate 18, height _0  (1.88 m), weight 81.9 kg (180 lb 9.6 oz), SpO2 99 %. Physical Exam  Constitutional: He is oriented to person, place, and time. He appears well-developed and well-nourished.  HENT:  Head: Normocephalic and atraumatic.  Eyes: Pupils are equal, round, and reactive to light. EOM are normal.  Neck: Normal range of motion. Neck supple.  Musculoskeletal: Normal range of motion.  Neurological: He is alert and oriented to person, place, and time. He has normal strength and normal reflexes. No cranial nerve deficit or sensory deficit. Coordination abnormal. Gait normal. GCS eye subscore is 4. GCS verbal subscore is 5. GCS motor subscore is 6.  Patient has slight dysmetria on the left and incoordination on the left with heel-shin testing.  He has no pronator drift.  Assessment/Plan:  The patient has, what appears to be a solitary left cerebellar metastasis on head CT, measuring 3.4 cm in diameter.  He presented with protracted nausea nad vomiting.  This has improved on decadron.  He has not vomited for two days and no longer is complaining of nausea.  His gait imbalance has also improved.  He is to get an SRS protocol MRI of the brain this morning at [facility]  He will require treatment  for this lesion.  Our considered opinion from the multi-disciplinary brain tumor conference is that treatment should consist of preoperative SRS followed by suboccipital craniectomy and tumor resection using Brainlab for neuro-navigation.  The lesion is too large for SRS alone.  He wishes to attend his grandchild's pre-school graduation this Friday.  Since he is doing so much better on decadron, we will discharge him home on steroids (4 mg QID) with insulin sliding scale for diabetes.  His wife will manage his sliding scale.  He will undergo SIM this week with SRS next Monday (6/10) and we will tentatively plan on surgery that Wednesday (6/12).  He hopes to go on a family vacation at the end of the month and I told him this seems like a realistic plan.  Patient and his wife are well aware of risks of therapy (and the risks of no treatment) and wish to proceed with this plan.  Peggyann Shoals, MD 05/26/2018, 9:13 AM

## 2018-06-04 ENCOUNTER — Encounter (HOSPITAL_COMMUNITY): Payer: Self-pay

## 2018-06-04 ENCOUNTER — Encounter: Payer: Self-pay | Admitting: Radiation Oncology

## 2018-06-04 ENCOUNTER — Inpatient Hospital Stay (HOSPITAL_COMMUNITY)
Admission: RE | Admit: 2018-06-04 | Discharge: 2018-06-06 | DRG: 025 | Disposition: A | Discharge status: Home / Self Care | Payer: 59 | Source: Ambulatory Visit | Attending: Neurosurgery | Admitting: Neurosurgery

## 2018-06-04 ENCOUNTER — Encounter (HOSPITAL_COMMUNITY)
Admission: RE | Disposition: A | Discharge status: Home / Self Care | Payer: Self-pay | Source: Ambulatory Visit | Attending: Neurosurgery

## 2018-06-04 ENCOUNTER — Inpatient Hospital Stay (HOSPITAL_COMMUNITY): Payer: 59

## 2018-06-04 ENCOUNTER — Inpatient Hospital Stay (HOSPITAL_COMMUNITY): Payer: 59 | Admitting: Emergency Medicine

## 2018-06-04 DIAGNOSIS — C7931 Secondary malignant neoplasm of brain: Principal | ICD-10-CM | POA: Diagnosis present

## 2018-06-04 DIAGNOSIS — Z923 Personal history of irradiation: Secondary | ICD-10-CM

## 2018-06-04 DIAGNOSIS — R112 Nausea with vomiting, unspecified: Secondary | ICD-10-CM | POA: Diagnosis present

## 2018-06-04 DIAGNOSIS — I10 Essential (primary) hypertension: Secondary | ICD-10-CM | POA: Diagnosis present

## 2018-06-04 DIAGNOSIS — I252 Old myocardial infarction: Secondary | ICD-10-CM

## 2018-06-04 DIAGNOSIS — Z955 Presence of coronary angioplasty implant and graft: Secondary | ICD-10-CM | POA: Diagnosis not present

## 2018-06-04 DIAGNOSIS — E785 Hyperlipidemia, unspecified: Secondary | ICD-10-CM | POA: Diagnosis not present

## 2018-06-04 DIAGNOSIS — R279 Unspecified lack of coordination: Secondary | ICD-10-CM | POA: Diagnosis present

## 2018-06-04 DIAGNOSIS — C189 Malignant neoplasm of colon, unspecified: Secondary | ICD-10-CM | POA: Diagnosis present

## 2018-06-04 DIAGNOSIS — Z7982 Long term (current) use of aspirin: Secondary | ICD-10-CM | POA: Diagnosis not present

## 2018-06-04 DIAGNOSIS — C7951 Secondary malignant neoplasm of bone: Secondary | ICD-10-CM

## 2018-06-04 DIAGNOSIS — E1142 Type 2 diabetes mellitus with diabetic polyneuropathy: Secondary | ICD-10-CM | POA: Diagnosis present

## 2018-06-04 DIAGNOSIS — G629 Polyneuropathy, unspecified: Secondary | ICD-10-CM | POA: Diagnosis present

## 2018-06-04 DIAGNOSIS — I2510 Atherosclerotic heart disease of native coronary artery without angina pectoris: Secondary | ICD-10-CM | POA: Diagnosis present

## 2018-06-04 DIAGNOSIS — E1165 Type 2 diabetes mellitus with hyperglycemia: Secondary | ICD-10-CM | POA: Diagnosis not present

## 2018-06-04 DIAGNOSIS — C7800 Secondary malignant neoplasm of unspecified lung: Secondary | ICD-10-CM | POA: Diagnosis present

## 2018-06-04 DIAGNOSIS — D496 Neoplasm of unspecified behavior of brain: Secondary | ICD-10-CM | POA: Diagnosis not present

## 2018-06-04 DIAGNOSIS — IMO0002 Reserved for concepts with insufficient information to code with codable children: Secondary | ICD-10-CM

## 2018-06-04 DIAGNOSIS — Z7952 Long term (current) use of systemic steroids: Secondary | ICD-10-CM

## 2018-06-04 DIAGNOSIS — R531 Weakness: Secondary | ICD-10-CM | POA: Diagnosis present

## 2018-06-04 DIAGNOSIS — R51 Headache: Secondary | ICD-10-CM | POA: Diagnosis present

## 2018-06-04 DIAGNOSIS — G936 Cerebral edema: Secondary | ICD-10-CM | POA: Diagnosis present

## 2018-06-04 DIAGNOSIS — D638 Anemia in other chronic diseases classified elsewhere: Secondary | ICD-10-CM | POA: Diagnosis present

## 2018-06-04 DIAGNOSIS — R402413 Glasgow coma scale score 13-15, at hospital admission: Secondary | ICD-10-CM | POA: Diagnosis present

## 2018-06-04 DIAGNOSIS — D649 Anemia, unspecified: Secondary | ICD-10-CM | POA: Diagnosis present

## 2018-06-04 DIAGNOSIS — N400 Enlarged prostate without lower urinary tract symptoms: Secondary | ICD-10-CM | POA: Diagnosis present

## 2018-06-04 DIAGNOSIS — D630 Anemia in neoplastic disease: Secondary | ICD-10-CM | POA: Diagnosis present

## 2018-06-04 DIAGNOSIS — Z794 Long term (current) use of insulin: Secondary | ICD-10-CM

## 2018-06-04 DIAGNOSIS — C787 Secondary malignant neoplasm of liver and intrahepatic bile duct: Secondary | ICD-10-CM | POA: Diagnosis present

## 2018-06-04 DIAGNOSIS — Z9089 Acquired absence of other organs: Secondary | ICD-10-CM

## 2018-06-04 DIAGNOSIS — K859 Acute pancreatitis without necrosis or infection, unspecified: Secondary | ICD-10-CM | POA: Diagnosis present

## 2018-06-04 DIAGNOSIS — Z79899 Other long term (current) drug therapy: Secondary | ICD-10-CM

## 2018-06-04 DIAGNOSIS — I5032 Chronic diastolic (congestive) heart failure: Secondary | ICD-10-CM | POA: Diagnosis not present

## 2018-06-04 DIAGNOSIS — I110 Hypertensive heart disease with heart failure: Secondary | ICD-10-CM | POA: Diagnosis not present

## 2018-06-04 DIAGNOSIS — C716 Malignant neoplasm of cerebellum: Secondary | ICD-10-CM | POA: Diagnosis not present

## 2018-06-04 DIAGNOSIS — Z9049 Acquired absence of other specified parts of digestive tract: Secondary | ICD-10-CM | POA: Diagnosis not present

## 2018-06-04 HISTORY — PX: BRAIN SURGERY: SHX531

## 2018-06-04 HISTORY — PX: APPLICATION OF CRANIAL NAVIGATION: SHX6578

## 2018-06-04 HISTORY — PX: CRANIOTOMY: SHX93

## 2018-06-04 LAB — GLUCOSE, CAPILLARY
GLUCOSE-CAPILLARY: 166 mg/dL — AB (ref 65–99)
GLUCOSE-CAPILLARY: 236 mg/dL — AB (ref 65–99)
GLUCOSE-CAPILLARY: 430 mg/dL — AB (ref 65–99)
Glucose-Capillary: 160 mg/dL — ABNORMAL HIGH (ref 65–99)
Glucose-Capillary: 406 mg/dL — ABNORMAL HIGH (ref 65–99)
Glucose-Capillary: 401 mg/dL — ABNORMAL HIGH (ref 65–99)
Glucose-Capillary: 298 mg/dL — ABNORMAL HIGH (ref 65–99)
Glucose-Capillary: 217 mg/dL — ABNORMAL HIGH (ref 65–99)

## 2018-06-04 LAB — POCT I-STAT 4, (NA,K, GLUC, HGB,HCT)
GLUCOSE: 172 mg/dL — AB (ref 65–99)
HEMATOCRIT: 40 % (ref 39.0–52.0)
Hemoglobin: 13.6 g/dL (ref 13.0–17.0)
POTASSIUM: 4.1 mmol/L (ref 3.5–5.1)
Sodium: 137 mmol/L (ref 135–145)

## 2018-06-04 LAB — MRSA PCR SCREENING: MRSA BY PCR: NEGATIVE

## 2018-06-04 LAB — GLUCOSE, RANDOM: Glucose, Bld: 411 mg/dL — ABNORMAL HIGH (ref 65–99)

## 2018-06-04 SURGERY — CRANIOTOMY TUMOR EXCISION
Anesthesia: General | Site: Head | Laterality: Left

## 2018-06-04 MED ORDER — INSULIN ASPART 100 UNIT/ML ~~LOC~~ SOLN: 0.0000 [IU] | Freq: Three times a day (TID) | SUBCUTANEOUS | Status: DC

## 2018-06-04 MED ORDER — INSULIN ASPART 100 UNIT/ML ~~LOC~~ SOLN
0.0000 [IU] | Freq: Three times a day (TID) | SUBCUTANEOUS | Status: DC
  Administered 2018-06-04: 20 [IU] via SUBCUTANEOUS

## 2018-06-04 MED ORDER — MUSCLE RUB 10-15 % EX CREA
1.0000 "application " | TOPICAL_CREAM | Freq: Two times a day (BID) | CUTANEOUS | Status: DC | PRN
  Filled 2018-06-04: qty 85

## 2018-06-04 MED ORDER — SODIUM CHLORIDE 0.9 % IV SOLN
INTRAVENOUS | Status: DC | PRN
  Administered 2018-06-04: 08:00:00 via INTRAVENOUS

## 2018-06-04 MED ORDER — ONDANSETRON HCL 4 MG/2ML IJ SOLN: 4.0000 mg | INTRAMUSCULAR | Status: DC | PRN

## 2018-06-04 MED ORDER — LABETALOL HCL 5 MG/ML IV SOLN: 10.0000 mg | INTRAVENOUS | Status: DC | PRN

## 2018-06-04 MED ORDER — LIDOCAINE 2% (20 MG/ML) 5 ML SYRINGE
INTRAMUSCULAR | Status: AC
  Filled 2018-06-04: qty 5

## 2018-06-04 MED ORDER — PROPOFOL 10 MG/ML IV BOLUS
INTRAVENOUS | Status: AC
  Filled 2018-06-04: qty 20

## 2018-06-04 MED ORDER — NITROGLYCERIN 0.4 MG SL SUBL: 0.4000 mg | SUBLINGUAL_TABLET | SUBLINGUAL | Status: DC | PRN

## 2018-06-04 MED ORDER — METHOCARBAMOL 500 MG PO TABS: 750.0000 mg | ORAL_TABLET | ORAL | Status: DC

## 2018-06-04 MED ORDER — INSULIN LISPRO 100 UNIT/ML (KWIKPEN): 2.0000 [IU] | PEN_INJECTOR | SUBCUTANEOUS | Status: DC

## 2018-06-04 MED ORDER — ONDANSETRON HCL 4 MG/2ML IJ SOLN: 4.0000 mg | Freq: Once | INTRAMUSCULAR | Status: DC | PRN

## 2018-06-04 MED ORDER — ONDANSETRON HCL 4 MG PO TABS: 4.0000 mg | ORAL_TABLET | ORAL | Status: DC | PRN

## 2018-06-04 MED ORDER — MORPHINE SULFATE (PF) 2 MG/ML IV SOLN
2.0000 mg | INTRAVENOUS | Status: DC | PRN
  Administered 2018-06-05 (×2): 2 mg via INTRAVENOUS
  Filled 2018-06-04 (×2): qty 1

## 2018-06-04 MED ORDER — SODIUM CHLORIDE 0.9 % IV SOLN
INTRAVENOUS | Status: DC
  Administered 2018-06-04: 2.4 [IU]/h via INTRAVENOUS
  Filled 2018-06-04: qty 1

## 2018-06-04 MED ORDER — BACITRACIN ZINC 500 UNIT/GM EX OINT
TOPICAL_OINTMENT | CUTANEOUS | Status: AC
  Filled 2018-06-04: qty 28.35

## 2018-06-04 MED ORDER — ACETAMINOPHEN 325 MG PO TABS
650.0000 mg | ORAL_TABLET | ORAL | Status: DC | PRN
  Administered 2018-06-05 – 2018-06-06 (×3): 650 mg via ORAL
  Filled 2018-06-04 (×3): qty 2

## 2018-06-04 MED ORDER — DEXAMETHASONE SODIUM PHOSPHATE 4 MG/ML IJ SOLN: 4.0000 mg | Freq: Three times a day (TID) | INTRAMUSCULAR | Status: DC

## 2018-06-04 MED ORDER — ONDANSETRON 4 MG PO TBDP: 4.0000 mg | ORAL_TABLET | Freq: Three times a day (TID) | ORAL | Status: DC | PRN

## 2018-06-04 MED ORDER — ONDANSETRON HCL 4 MG/2ML IJ SOLN
INTRAMUSCULAR | Status: DC | PRN
  Administered 2018-06-04: 4 mg via INTRAVENOUS

## 2018-06-04 MED ORDER — 0.9 % SODIUM CHLORIDE (POUR BTL) OPTIME
TOPICAL | Status: DC | PRN
  Administered 2018-06-04: 3000 mL

## 2018-06-04 MED ORDER — SURGIFOAM 100 EX MISC
CUTANEOUS | Status: DC | PRN
  Administered 2018-06-04: 20 mL via TOPICAL

## 2018-06-04 MED ORDER — TAMSULOSIN HCL 0.4 MG PO CAPS
0.4000 mg | ORAL_CAPSULE | Freq: Every day | ORAL | Status: DC
  Administered 2018-06-05 – 2018-06-06 (×2): 0.4 mg via ORAL
  Filled 2018-06-04 (×2): qty 1

## 2018-06-04 MED ORDER — BUPIVACAINE HCL (PF) 0.5 % IJ SOLN
INTRAMUSCULAR | Status: DC | PRN
  Administered 2018-06-04: 15 mL

## 2018-06-04 MED ORDER — DEXAMETHASONE SODIUM PHOSPHATE 4 MG/ML IJ SOLN: 4.0000 mg | Freq: Four times a day (QID) | INTRAMUSCULAR | Status: DC

## 2018-06-04 MED ORDER — FENTANYL CITRATE (PF) 100 MCG/2ML IJ SOLN: 25.0000 ug | INTRAMUSCULAR | Status: DC | PRN

## 2018-06-04 MED ORDER — ACETAMINOPHEN 650 MG RE SUPP: 650.0000 mg | RECTAL | Status: DC | PRN

## 2018-06-04 MED ORDER — MICROFIBRILLAR COLL HEMOSTAT EX POWD
CUTANEOUS | Status: AC
  Filled 2018-06-04: qty 5

## 2018-06-04 MED ORDER — FLEET ENEMA 7-19 GM/118ML RE ENEM: 1.0000 | ENEMA | Freq: Once | RECTAL | Status: DC | PRN

## 2018-06-04 MED ORDER — METHOCARBAMOL 750 MG PO TABS: 750.0000 mg | ORAL_TABLET | Freq: Three times a day (TID) | ORAL | Status: DC | PRN

## 2018-06-04 MED ORDER — BUPIVACAINE HCL (PF) 0.5 % IJ SOLN
INTRAMUSCULAR | Status: AC
  Filled 2018-06-04: qty 30

## 2018-06-04 MED ORDER — ROCURONIUM BROMIDE 10 MG/ML (PF) SYRINGE
PREFILLED_SYRINGE | INTRAVENOUS | Status: AC
  Filled 2018-06-04: qty 10

## 2018-06-04 MED ORDER — CEFAZOLIN SODIUM-DEXTROSE 2-4 GM/100ML-% IV SOLN
INTRAVENOUS | Status: AC
  Filled 2018-06-04: qty 100

## 2018-06-04 MED ORDER — CEFAZOLIN SODIUM-DEXTROSE 2-4 GM/100ML-% IV SOLN
2.0000 g | Freq: Three times a day (TID) | INTRAVENOUS | Status: AC
  Administered 2018-06-04 – 2018-06-05 (×2): 2 g via INTRAVENOUS
  Filled 2018-06-04 (×3): qty 100

## 2018-06-04 MED ORDER — INSULIN ASPART 100 UNIT/ML ~~LOC~~ SOLN
4.0000 [IU] | Freq: Three times a day (TID) | SUBCUTANEOUS | Status: DC
  Administered 2018-06-04: 4 [IU] via SUBCUTANEOUS

## 2018-06-04 MED ORDER — PROPOFOL 10 MG/ML IV BOLUS
INTRAVENOUS | Status: DC | PRN
  Administered 2018-06-04: 150 mg via INTRAVENOUS
  Administered 2018-06-04: 50 mg via INTRAVENOUS

## 2018-06-04 MED ORDER — COQ10 200 MG PO CAPS: 200.0000 mg | ORAL_CAPSULE | Freq: Every day | ORAL | Status: DC

## 2018-06-04 MED ORDER — BACITRACIN ZINC 500 UNIT/GM EX OINT
TOPICAL_OINTMENT | CUTANEOUS | Status: DC | PRN
  Administered 2018-06-04: 1 via TOPICAL

## 2018-06-04 MED ORDER — DEXAMETHASONE SODIUM PHOSPHATE 10 MG/ML IJ SOLN
6.0000 mg | Freq: Four times a day (QID) | INTRAMUSCULAR | Status: AC
  Administered 2018-06-04 – 2018-06-05 (×4): 6 mg via INTRAVENOUS
  Filled 2018-06-04 (×4): qty 1

## 2018-06-04 MED ORDER — LISINOPRIL 2.5 MG PO TABS
2.5000 mg | ORAL_TABLET | Freq: Every day | ORAL | Status: DC
  Administered 2018-06-04 – 2018-06-06 (×3): 2.5 mg via ORAL
  Filled 2018-06-04 (×3): qty 1

## 2018-06-04 MED ORDER — FENTANYL CITRATE (PF) 250 MCG/5ML IJ SOLN
INTRAMUSCULAR | Status: AC
  Filled 2018-06-04: qty 5

## 2018-06-04 MED ORDER — METHOCARBAMOL 750 MG PO TABS
750.0000 mg | ORAL_TABLET | Freq: Every day | ORAL | Status: DC
  Administered 2018-06-04 – 2018-06-05 (×2): 750 mg via ORAL
  Filled 2018-06-04: qty 2
  Filled 2018-06-04: qty 1

## 2018-06-04 MED ORDER — DOCUSATE SODIUM 100 MG PO CAPS
100.0000 mg | ORAL_CAPSULE | Freq: Two times a day (BID) | ORAL | Status: DC
  Administered 2018-06-04 – 2018-06-05 (×3): 100 mg via ORAL
  Filled 2018-06-04 (×3): qty 1

## 2018-06-04 MED ORDER — ZOLPIDEM TARTRATE 5 MG PO TABS: 5.0000 mg | ORAL_TABLET | Freq: Every evening | ORAL | Status: DC | PRN

## 2018-06-04 MED ORDER — HEMOSTATIC AGENTS (NO CHARGE) OPTIME
TOPICAL | Status: DC | PRN
  Administered 2018-06-04 (×2): 1 via TOPICAL

## 2018-06-04 MED ORDER — LIDOCAINE-EPINEPHRINE 1 %-1:100000 IJ SOLN
INTRAMUSCULAR | Status: AC
  Filled 2018-06-04: qty 1

## 2018-06-04 MED ORDER — LIDOCAINE-EPINEPHRINE 1 %-1:100000 IJ SOLN
INTRAMUSCULAR | Status: DC | PRN
  Administered 2018-06-04: 15 mL

## 2018-06-04 MED ORDER — HYDROCODONE-ACETAMINOPHEN 5-325 MG PO TABS
1.0000 | ORAL_TABLET | ORAL | Status: DC | PRN
  Administered 2018-06-04: 1 via ORAL
  Filled 2018-06-04: qty 1

## 2018-06-04 MED ORDER — DEXAMETHASONE SODIUM PHOSPHATE 10 MG/ML IJ SOLN
INTRAMUSCULAR | Status: DC | PRN
  Administered 2018-06-04: 10 mg via INTRAVENOUS

## 2018-06-04 MED ORDER — FAMOTIDINE IN NACL 20-0.9 MG/50ML-% IV SOLN
20.0000 mg | Freq: Two times a day (BID) | INTRAVENOUS | Status: DC
  Administered 2018-06-04 – 2018-06-05 (×3): 20 mg via INTRAVENOUS
  Filled 2018-06-04 (×3): qty 50

## 2018-06-04 MED ORDER — CEFAZOLIN SODIUM-DEXTROSE 2-4 GM/100ML-% IV SOLN
2.0000 g | INTRAVENOUS | Status: AC
  Administered 2018-06-04: 2 g via INTRAVENOUS

## 2018-06-04 MED ORDER — PHENYLEPHRINE HCL 10 MG/ML IJ SOLN
INTRAMUSCULAR | Status: DC | PRN
  Administered 2018-06-04: 25 ug/min via INTRAVENOUS

## 2018-06-04 MED ORDER — INSULIN ASPART 100 UNIT/ML ~~LOC~~ SOLN: 0.0000 [IU] | Freq: Every day | SUBCUTANEOUS | Status: DC

## 2018-06-04 MED ORDER — ACETAMINOPHEN 500 MG PO TABS: 1000.0000 mg | ORAL_TABLET | Freq: Four times a day (QID) | ORAL | Status: DC | PRN

## 2018-06-04 MED ORDER — THROMBIN 5000 UNITS EX SOLR
CUTANEOUS | Status: AC
  Filled 2018-06-04: qty 5000

## 2018-06-04 MED ORDER — ROCURONIUM BROMIDE 10 MG/ML (PF) SYRINGE
PREFILLED_SYRINGE | INTRAVENOUS | Status: DC | PRN
  Administered 2018-06-04: 10 mg via INTRAVENOUS
  Administered 2018-06-04: 70 mg via INTRAVENOUS
  Administered 2018-06-04: 10 mg via INTRAVENOUS
  Administered 2018-06-04 (×2): 20 mg via INTRAVENOUS

## 2018-06-04 MED ORDER — CHLORHEXIDINE GLUCONATE CLOTH 2 % EX PADS: 6.0000 | MEDICATED_PAD | Freq: Once | CUTANEOUS | Status: DC

## 2018-06-04 MED ORDER — ATORVASTATIN CALCIUM 10 MG PO TABS
10.0000 mg | ORAL_TABLET | Freq: Every day | ORAL | Status: DC
  Administered 2018-06-04 – 2018-06-06 (×3): 10 mg via ORAL
  Filled 2018-06-04 (×3): qty 1

## 2018-06-04 MED ORDER — DEXAMETHASONE SODIUM PHOSPHATE 10 MG/ML IJ SOLN
INTRAMUSCULAR | Status: AC
  Filled 2018-06-04: qty 1

## 2018-06-04 MED ORDER — SODIUM CHLORIDE 0.9 % IV SOLN
0.0500 ug/kg/min | INTRAVENOUS | Status: AC
  Administered 2018-06-04: 0.05 ug/kg/min via INTRAVENOUS
  Filled 2018-06-04: qty 5000

## 2018-06-04 MED ORDER — LIDOCAINE 2% (20 MG/ML) 5 ML SYRINGE
INTRAMUSCULAR | Status: DC | PRN
  Administered 2018-06-04: 60 mg via INTRAVENOUS

## 2018-06-04 MED ORDER — BISACODYL 10 MG RE SUPP: 10.0000 mg | Freq: Every day | RECTAL | Status: DC | PRN

## 2018-06-04 MED ORDER — INSULIN GLARGINE 100 UNIT/ML ~~LOC~~ SOLN
12.0000 [IU] | Freq: Every day | SUBCUTANEOUS | Status: DC
  Filled 2018-06-04: qty 0.12

## 2018-06-04 MED ORDER — SUGAMMADEX SODIUM 200 MG/2ML IV SOLN
INTRAVENOUS | Status: AC
  Filled 2018-06-04: qty 2

## 2018-06-04 MED ORDER — FENTANYL CITRATE (PF) 100 MCG/2ML IJ SOLN
INTRAMUSCULAR | Status: DC | PRN
  Administered 2018-06-04 (×2): 50 ug via INTRAVENOUS

## 2018-06-04 MED ORDER — POTASSIUM CHLORIDE IN NACL 20-0.9 MEQ/L-% IV SOLN
INTRAVENOUS | Status: DC
  Administered 2018-06-04 – 2018-06-05 (×2): via INTRAVENOUS
  Filled 2018-06-04 (×2): qty 1000

## 2018-06-04 MED ORDER — PROMETHAZINE HCL 25 MG PO TABS: 12.5000 mg | ORAL_TABLET | ORAL | Status: DC | PRN

## 2018-06-04 MED ORDER — POLYETHYLENE GLYCOL 3350 17 G PO PACK: 17.0000 g | PACK | Freq: Every day | ORAL | Status: DC | PRN

## 2018-06-04 MED ORDER — DEXAMETHASONE 4 MG PO TABS: 8.0000 mg | ORAL_TABLET | Freq: Two times a day (BID) | ORAL | Status: DC

## 2018-06-04 MED ORDER — SUGAMMADEX SODIUM 200 MG/2ML IV SOLN
INTRAVENOUS | Status: DC | PRN
  Administered 2018-06-04: 200 mg via INTRAVENOUS

## 2018-06-04 MED ORDER — THROMBIN 20000 UNITS EX SOLR
CUTANEOUS | Status: AC
  Filled 2018-06-04: qty 20000

## 2018-06-04 SURGICAL SUPPLY — 84 items
BIT DRILL WIRE PASS 1.3MM (BIT) IMPLANT
BLADE CLIPPER SURG (BLADE) ×3 IMPLANT
BNDG STRETCH 4X75 STRL LF (GAUZE/BANDAGES/DRESSINGS) IMPLANT
BUR ACORN 6.0 PRECISION (BURR) ×2 IMPLANT
BUR ACORN 6.0MM PRECISION (BURR) ×1
BUR ROUND FLUTED 5 RND (BURR) ×2 IMPLANT
BUR ROUND FLUTED 5MM RND (BURR) ×1
BUR SPIRAL ROUTER 2.3 (BUR) ×2 IMPLANT
BUR SPIRAL ROUTER 2.3MM (BUR) ×1
CANISTER SUCT 3000ML PPV (MISCELLANEOUS) ×6 IMPLANT
CARTRIDGE OIL MAESTRO DRILL (MISCELLANEOUS) ×1 IMPLANT
CLIP VESOCCLUDE MED 6/CT (CLIP) IMPLANT
CONT SPEC 4OZ CLIKSEAL STRL BL (MISCELLANEOUS) ×3 IMPLANT
COVER MAYO STAND STRL (DRAPES) IMPLANT
DECANTER SPIKE VIAL GLASS SM (MISCELLANEOUS) IMPLANT
DIFFUSER DRILL AIR PNEUMATIC (MISCELLANEOUS) ×3 IMPLANT
DRAPE MICROSCOPE LEICA (MISCELLANEOUS) ×3 IMPLANT
DRAPE NEUROLOGICAL W/INCISE (DRAPES) ×3 IMPLANT
DRAPE STERI IOBAN 125X83 (DRAPES) IMPLANT
DRAPE WARM FLUID 44X44 (DRAPE) ×3 IMPLANT
DRILL WIRE PASS 1.3MM (BIT)
DRSG ADAPTIC 3X8 NADH LF (GAUZE/BANDAGES/DRESSINGS) ×3 IMPLANT
DRSG OPSITE POSTOP 4X8 (GAUZE/BANDAGES/DRESSINGS) ×3 IMPLANT
DURAMATRIX ONLAY 2X2 (Neuro Prosthesis/Implant) ×3 IMPLANT
DURAPREP 6ML APPLICATOR 50/CS (WOUND CARE) ×3 IMPLANT
ELECT REM PT RETURN 9FT ADLT (ELECTROSURGICAL) ×3
ELECTRODE REM PT RTRN 9FT ADLT (ELECTROSURGICAL) ×1 IMPLANT
EVACUATOR 1/8 PVC DRAIN (DRAIN) IMPLANT
EVACUATOR SILICONE 100CC (DRAIN) IMPLANT
FLOSEAL 5ML (HEMOSTASIS) ×3 IMPLANT
FORCEPS BIPOLAR SPETZLER 8 1.0 (NEUROSURGERY SUPPLIES) ×3 IMPLANT
GAUZE SPONGE 4X4 12PLY STRL (GAUZE/BANDAGES/DRESSINGS) IMPLANT
GAUZE SPONGE 4X4 16PLY XRAY LF (GAUZE/BANDAGES/DRESSINGS) IMPLANT
GLOVE BIO SURGEON STRL SZ8 (GLOVE) ×6 IMPLANT
GLOVE BIOGEL PI IND STRL 7.5 (GLOVE) ×1 IMPLANT
GLOVE BIOGEL PI IND STRL 8 (GLOVE) ×4 IMPLANT
GLOVE BIOGEL PI IND STRL 8.5 (GLOVE) ×1 IMPLANT
GLOVE BIOGEL PI INDICATOR 7.5 (GLOVE) ×2
GLOVE BIOGEL PI INDICATOR 8 (GLOVE) ×8
GLOVE BIOGEL PI INDICATOR 8.5 (GLOVE) ×2
GLOVE ECLIPSE 6.5 STRL STRAW (GLOVE) ×9 IMPLANT
GLOVE ECLIPSE 7.5 STRL STRAW (GLOVE) ×9 IMPLANT
GLOVE ECLIPSE 8.0 STRL XLNG CF (GLOVE) ×3 IMPLANT
GLOVE EXAM NITRILE LRG STRL (GLOVE) IMPLANT
GLOVE EXAM NITRILE XL STR (GLOVE) IMPLANT
GLOVE EXAM NITRILE XS STR PU (GLOVE) IMPLANT
GOWN STRL REUS W/ TWL LRG LVL3 (GOWN DISPOSABLE) ×1 IMPLANT
GOWN STRL REUS W/ TWL XL LVL3 (GOWN DISPOSABLE) ×1 IMPLANT
GOWN STRL REUS W/TWL 2XL LVL3 (GOWN DISPOSABLE) ×6 IMPLANT
GOWN STRL REUS W/TWL LRG LVL3 (GOWN DISPOSABLE) ×2
GOWN STRL REUS W/TWL XL LVL3 (GOWN DISPOSABLE) ×2
HEMOSTAT SURGICEL 2X14 (HEMOSTASIS) ×3 IMPLANT
KIT BASIN OR (CUSTOM PROCEDURE TRAY) ×3 IMPLANT
KIT TURNOVER KIT B (KITS) ×3 IMPLANT
MARKER SKIN DUAL TIP RULER LAB (MISCELLANEOUS) ×6 IMPLANT
MARKER SPHERE PSV REFLC 13MM (MARKER) ×6 IMPLANT
NEEDLE HYPO 25X1 1.5 SAFETY (NEEDLE) ×3 IMPLANT
NS IRRIG 1000ML POUR BTL (IV SOLUTION) ×9 IMPLANT
OIL CARTRIDGE MAESTRO DRILL (MISCELLANEOUS) ×3
PACK CRANIOTOMY CUSTOM (CUSTOM PROCEDURE TRAY) ×3 IMPLANT
PAD ARMBOARD 7.5X6 YLW CONV (MISCELLANEOUS) ×9 IMPLANT
PATTIES SURGICAL .25X.25 (GAUZE/BANDAGES/DRESSINGS) IMPLANT
PATTIES SURGICAL .5 X.5 (GAUZE/BANDAGES/DRESSINGS) ×3 IMPLANT
PATTIES SURGICAL .5 X3 (DISPOSABLE) IMPLANT
PATTIES SURGICAL 1/4 X 3 (GAUZE/BANDAGES/DRESSINGS) IMPLANT
PATTIES SURGICAL 1X1 (DISPOSABLE) IMPLANT
PIN MAYFIELD SKULL DISP (PIN) ×3 IMPLANT
RUBBERBAND STERILE (MISCELLANEOUS) ×6 IMPLANT
SPECIMEN JAR SMALL (MISCELLANEOUS) IMPLANT
SPONGE NEURO XRAY DETECT 1X3 (DISPOSABLE) IMPLANT
SPONGE SURGIFOAM ABS GEL 100 (HEMOSTASIS) ×3 IMPLANT
STAPLER SKIN PROX WIDE 3.9 (STAPLE) ×6 IMPLANT
SUT ETHILON 3 0 FSL (SUTURE) IMPLANT
SUT NURALON 4 0 TR CR/8 (SUTURE) ×6 IMPLANT
SUT SILK 2 0 PERMA HAND 18 BK (SUTURE) IMPLANT
SUT VIC AB 2-0 CP2 18 (SUTURE) ×6 IMPLANT
SYR CONTROL 10ML LL (SYRINGE) IMPLANT
TOWEL GREEN STERILE (TOWEL DISPOSABLE) ×3 IMPLANT
TOWEL GREEN STERILE FF (TOWEL DISPOSABLE) ×3 IMPLANT
TRAY FOLEY MTR SLVR 16FR STAT (SET/KITS/TRAYS/PACK) ×3 IMPLANT
TUBE CONNECTING 12'X1/4 (SUCTIONS)
TUBE CONNECTING 12X1/4 (SUCTIONS) IMPLANT
UNDERPAD 30X30 (UNDERPADS AND DIAPERS) IMPLANT
WATER STERILE IRR 1000ML POUR (IV SOLUTION) ×3 IMPLANT

## 2018-06-04 NOTE — Transfer of Care (Signed)
Immediate Anesthesia Transfer of Care Note  Patient: Allen Glenn  Procedure(s) Performed: CRANIOTOMY TUMOR EXCISION with Brainlab (Left Head) APPLICATION OF CRANIAL NAVIGATION (Left Head)  Patient Location: PACU  Anesthesia Type:General  Level of Consciousness: awake, alert , oriented and patient cooperative  Airway & Oxygen Therapy: Patient Spontanous Breathing  Post-op Assessment: Report given to RN  Post vital signs: Reviewed and stable  Last Vitals:  Vitals Value Taken Time  BP 142/78 06/04/2018 11:55 AM  Temp    Pulse 61 06/04/2018 11:59 AM  Resp 15 06/04/2018 11:59 AM  SpO2 100 % 06/04/2018 11:59 AM  Vitals shown include unvalidated device data.  Last Pain:  Vitals:   06/04/18 0730  PainSc: 0-No pain         Complications: No apparent anesthesia complications

## 2018-06-04 NOTE — Anesthesia Postprocedure Evaluation (Signed)
Anesthesia Post Note  Patient: Allen Glenn  Procedure(s) Performed: CRANIOTOMY TUMOR EXCISION with Brainlab (Left Head) APPLICATION OF CRANIAL NAVIGATION (Left Head)     Patient location during evaluation: PACU Anesthesia Type: General Level of consciousness: awake Pain management: pain level controlled Vital Signs Assessment: post-procedure vital signs reviewed and stable Respiratory status: spontaneous breathing, nonlabored ventilation, respiratory function stable and patient connected to nasal cannula oxygen Cardiovascular status: blood pressure returned to baseline and stable Postop Assessment: no apparent nausea or vomiting Anesthetic complications: no    Last Vitals:  Vitals:   06/04/18 2000 06/04/18 2100  BP: (!) 145/75 136/72  Pulse: 87 79  Resp: (!) 22 (!) 22  Temp: 37.1 C   SpO2: 99% 97%    Last Pain:  Vitals:   06/04/18 2000  TempSrc: Oral  PainSc: 2                  Fanchon Papania P Yamir Carignan

## 2018-06-04 NOTE — Progress Notes (Signed)
  Radiation Oncology         [phone] ________________________________  Name: Allen Glenn MRN: [id number]  Date: 06/04/2018  DOB: August 04, 1956  End of Treatment Note  Diagnosis: Metastasis to Brain  Indication for treatment:  Palliative  Radiation treatment dates: 06/02/2018  Site/dose:   Brain/ 20 Gy delivered in 1 fractions of 20 Gy  Beams/energy:   SBRT/SRT-VMAT/ 6X-FFF  Narrative: The patient tolerated radiation treatment relatively well. He will undergo craniotomy tumor excision on 06/04/2018.  Plan: The patient has completed radiation treatment. The patient will return to radiation oncology clinic for routine followup in one month. I advised them to call or return sooner if they have any questions or concerns related to their recovery or treatment.  ------------------------------------------------  Jodelle Gross, MD, PhD  This document serves as a record of services personally performed by Kyung Rudd, MD. It was created on his behalf by Wilburn Mylar, a trained medical scribe. The creation of this record is based on the scribe's personal observations and the provider's statements to them. This document has been checked and approved by the attending provider.

## 2018-06-04 NOTE — Anesthesia Procedure Notes (Addendum)
Procedure Name: Intubation Date/Time: 06/04/2018 8:53 AM Performed by: Murvin Natal, MD Pre-anesthesia Checklist: Patient identified, Emergency Drugs available, Suction available and Patient being monitored Patient Re-evaluated:Patient Re-evaluated prior to induction Oxygen Delivery Method: Circle System Utilized Preoxygenation: Pre-oxygenation with 100% oxygen Induction Type: IV induction Ventilation: Mask ventilation without difficulty Laryngoscope Size: Glidescope and 4 Grade View: Grade I Tube type: Oral Tube size: 7.5 mm Number of attempts: 1 Airway Equipment and Method: Stylet and Oral airway Placement Confirmation: ETT inserted through vocal cords under direct vision,  positive ETCO2 and breath sounds checked- equal and bilateral Secured at: 23 cm Tube secured with: Tape Dental Injury: Teeth and Oropharynx as per pre-operative assessment  Comments: Performed by Julianne Rice, SRNA

## 2018-06-04 NOTE — Anesthesia Preprocedure Evaluation (Addendum)
Anesthesia Evaluation  Patient identified by MRN, date of birth, ID band Patient awake    Reviewed: Allergy & Precautions, NPO status , Patient's Chart, lab work & pertinent test results  Airway Mallampati: III  TM Distance: >3 FB Neck ROM: Full    Dental no notable dental hx. (+) Teeth Intact, Dental Advisory Given   Pulmonary neg pulmonary ROS,    Pulmonary exam normal breath sounds clear to auscultation       Cardiovascular hypertension, Pt. on medications + CAD, + Past MI and + Cardiac Stents (2014)  Normal cardiovascular exam Rhythm:Regular Rate:Normal  ECG: NSR, rate 61  Nuclear stress test 12/15/14 (care everywhere):  1. Negative stress Cardiolite without ischemia. 2. Normal ejection fraction. 3. No wall motion abnormalities noted.  Sees cardiologist Tamala Julian) annually   Neuro/Psych negative psych ROS   GI/Hepatic negative GI ROS, Neg liver ROS,   Endo/Other  diabetes, Insulin Dependent  Renal/GU negative Renal ROS     Musculoskeletal negative musculoskeletal ROS (+)   Abdominal   Peds  Hematology  (+) anemia , HLD   Anesthesia Other Findings brain tumor Multiple Brain Metastases s/p radiation metastatic colon cancer cerebellar metastasis  Reproductive/Obstetrics                           Anesthesia Physical Anesthesia Plan  ASA: III  Anesthesia Plan: General   Post-op Pain Management:    Induction: Intravenous  PONV Risk Score and Plan: 2 and Ondansetron and Treatment may vary due to age or medical condition  Airway Management Planned: Oral ETT  Additional Equipment: Arterial line  Intra-op Plan:   Post-operative Plan: Extubation in OR and Possible Post-op intubation/ventilation  Informed Consent: I have reviewed the patients History and Physical, chart, labs and discussed the procedure including the risks, benefits and alternatives for the proposed anesthesia with  the patient or authorized representative who has indicated his/her understanding and acceptance.   Dental advisory given  Plan Discussed with: CRNA  Anesthesia Plan Comments:         Anesthesia Quick Evaluation

## 2018-06-04 NOTE — Anesthesia Procedure Notes (Signed)
Arterial Line Insertion Start/End6/11/2018 8:20 AM, 06/04/2018 8:23 AM Performed by: Barrington Ellison, CRNA, CRNA  Lidocaine 1% used for infiltration Left, radial was placed Catheter size: 20 G Hand hygiene performed  and maximum sterile barriers used  Allen's test indicative of satisfactory collateral circulation Attempts: 2 Procedure performed without using ultrasound guided technique. Following insertion, dressing applied and Biopatch. Post procedure assessment: normal  Patient tolerated the procedure well with no immediate complications.

## 2018-06-04 NOTE — Progress Notes (Signed)
Inpatient Diabetes Program Recommendations  AACE/ADA: New Consensus Statement on Inpatient Glycemic Control (2015)  Target Ranges:  Prepandial:   less than 140 mg/dL      Peak postprandial:   less than 180 mg/dL (1-2 hours)      Critically ill patients:  140 - 180 mg/dL   Lab Results  Component Value Date   GLUCAP 401 (H) 06/04/2018   HGBA1C 7.9 (H) 05/30/2018    Review of Glycemic ControlResults for Allen Glenn, Allen Glenn (MRN [id number]) as of 06/04/2018 21:19  Ref. Range 06/04/2018 17:14 06/04/2018 18:51 06/04/2018 20:00  Glucose-Capillary Latest Ref Range: 65 - 99 mg/dL 406 (H) 430 (H) 401 (H)    Diabetes history: Type 2 DM  Outpatient Diabetes medications: Basaglar 12 units q HS, Humalog 2-15 units tid with meals, Decadron 8 mg bid Current orders for Inpatient glycemic control:  IV insulin- ICU order set Novolog 0-20 units tid with meals and HS, Novolog 4 units tid with meals, Lantus 12 units q HS Inpatient Diabetes Program Recommendations:    Please consider d/c of SQ insulin since IV insulin ordered.  Blood sugars likely increased d/t IV decadron/steroids.  Agree that IV insulin is likely the best way to control blood sugars at this time. Spoke with RN, Estill Bamberg and she is starting IV insulin now.    Thanks, Adah Perl, RN, BC-ADM Inpatient Diabetes Coordinator Pager [phone] (8a-5p)

## 2018-06-04 NOTE — Progress Notes (Signed)
Awake, alert, conversant.  MAEW with full strength.  Hearing and facial movement intact and symmetric.  Doing well.

## 2018-06-04 NOTE — Brief Op Note (Signed)
06/04/2018  11:48 AM  PATIENT:  Allen Glenn  62 y.o. male  PRE-OPERATIVE DIAGNOSIS: metastatic brain tumor left cerebello-pontine angle  POST-OPERATIVE DIAGNOSIS:  metastatic brain tumor left cerebello-pontine angle   PROCEDURE:  Procedure(s) with comments: CRANIOTOMY TUMOR EXCISION with Brainlab (Left) - CRANIOTOMY TUMOR EXCISION with Brainlab APPLICATION OF CRANIAL NAVIGATION (Left) with microdissection  SURGEON:  Surgeon(s) and Role:    Erline Levine, MD - Primary    * Consuella Lose, MD - Assisting  PHYSICIAN ASSISTANT:   ASSISTANTS: [place], RN   ANESTHESIA:   general  EBL:  100 mL   BLOOD ADMINISTERED:none  DRAINS: none   LOCAL MEDICATIONS USED:  MARCAINE    and LIDOCAINE   SPECIMEN:  Excision  DISPOSITION OF SPECIMEN:  PATHOLOGY  COUNTS:  YES  TOURNIQUET:  * No tourniquets in log *  DICTATION: Patient is 62 year old man with a large left cerebellar metastatic brain tumor.  This is from colon cancer.  Patient has developed nausea and vomiting.  It was elected to take patient to surgery for sub-occipital craniectomy for metastasis. The tumor was in the left CP Angle.  He underwent preoperative SRS to the tumor.  Procedure:  Following smooth intubation, patient was placed in prone position on chest rolls with 3 pin head fixation in lateral park bench position with left CP angle exposed.  His head was registered with [facility] navigation. Postauricular region was shaved and prepped and draped in usual sterile fashion with Duraprep.  Area of planned incision was infiltrated with lidocaine. A linear incision was made in the suboccipital region to expose calvarium. High speed drill was used to perform craniectomy to expose the dura along with transverse and Sigmoid venous sinuses.  Dura was opened over the left cerebellar hemisphere.  The CP angle was entered and CSF was drained from foramen magnum.  Dura Luna retractor was used.  There was a dural base of  the tumor and we came across this and removed the tumor's blood supply.  We then dissected the tumor free from the cerebellar hemisphere.  The tumor was dense and whitish in color and we ere able to dissect this from surrounding cerebellum.  The entire tumor was removed.   Arachnoid layer was intact over 7th and 8th nerve complex.  Hemostasis was assured with Floseal. The brain was considerably more relaxed after tumor resection.  The dura was patched with Dura-matrix and  the fascia was closed with 2-0 vicryl sutures, subcutaneous tissues were reapproximated with 2-0 vicryl sutures and the skin was re approximated with  staples.  A sterile occlusive dressing was placed.  Patient was returned to a supine position and taken out of pins and extubated in the operating room and taken to Recovery having tolerated his surgery well.  Counts were correct at the end of the case.   PLAN OF CARE: Admit to inpatient   PATIENT DISPOSITION:  PACU - hemodynamically stable.   Delay start of Pharmacological VTE agent (>24hrs) due to surgical blood loss or risk of bleeding: yes

## 2018-06-04 NOTE — Op Note (Signed)
06/04/2018  11:48 AM  PATIENT:  Allen Glenn  62 y.o. male  PRE-OPERATIVE DIAGNOSIS: metastatic brain tumor left cerebello-pontine angle  POST-OPERATIVE DIAGNOSIS:  metastatic brain tumor left cerebello-pontine angle   PROCEDURE:  Procedure(s) with comments: CRANIOTOMY TUMOR EXCISION with [facility] (Left) - CRANIOTOMY TUMOR EXCISION with [facility] APPLICATION OF CRANIAL NAVIGATION (Left) with microdissection  SURGEON:  Surgeon(s) and Role:    Erline Levine, MD - Primary    * Consuella Lose, MD - Assisting  PHYSICIAN ASSISTANT:   ASSISTANTS: P[place], RN   ANESTHESIA:   general  EBL:  100 mL   BLOOD ADMINISTERED:none  DRAINS: none   LOCAL MEDICATIONS USED:  MARCAINE    and LIDOCAINE   SPECIMEN:  Excision  DISPOSITION OF SPECIMEN:  PATHOLOGY  COUNTS:  YES  TOURNIQUET:  * No tourniquets in log *  DICTATION: Patient is 62 year old man with a large left cerebellar metastatic brain tumor.  This is from colon cancer.  Patient has developed nausea and vomiting.  It was elected to take patient to surgery for sub-occipital craniectomy for metastasis. The tumor was in the left CP Angle.  He underwent preoperative SRS to the tumor.  Procedure:  Following smooth intubation, patient was placed in prone position on chest rolls with 3 pin head fixation in lateral park bench position with left CP angle exposed.  His head was registered with [facility] navigation. Postauricular region was shaved and prepped and draped in usual sterile fashion with Duraprep.  Area of planned incision was infiltrated with lidocaine. A linear incision was made in the suboccipital region to expose calvarium. High speed drill was used to perform craniectomy to expose the dura along with transverse and Sigmoid venous sinuses.  Dura was opened over the left cerebellar hemisphere.  The CP angle was entered and CSF was drained from foramen magnum.  Dura Luna retractor was used.  There was a dural base of  the tumor and we came across this and removed the tumor's blood supply.  We then dissected the tumor free from the cerebellar hemisphere.  The tumor was dense and whitish in color and we ere able to dissect this from surrounding cerebellum.  The entire tumor was removed.   Arachnoid layer was intact over 7th and 8th nerve complex.  Hemostasis was assured with Floseal. The brain was considerably more relaxed after tumor resection.  The dura was patched with Dura-matrix and  the fascia was closed with 2-0 vicryl sutures, subcutaneous tissues were reapproximated with 2-0 vicryl sutures and the skin was re approximated with  staples.  A sterile occlusive dressing was placed.  Patient was returned to a supine position and taken out of pins and extubated in the operating room and taken to Recovery having tolerated his surgery well.  Counts were correct at the end of the case.   PLAN OF CARE: Admit to inpatient   PATIENT DISPOSITION:  PACU - hemodynamically stable.   Delay start of Pharmacological VTE agent (>24hrs) due to surgical blood loss or risk of bleeding: yes

## 2018-06-04 NOTE — Progress Notes (Signed)
Mealtime CBG was 406; SSI orders indicate to obtain STAT random glucose and notify MD for BG's >400. Lab drawn and sent. When resulted will relay to MD for insulin orders.

## 2018-06-04 NOTE — Interval H&P Note (Signed)
History and Physical Interval Note:  06/04/2018 8:15 AM  Allen Glenn  has presented today for surgery, with the diagnosis of brain tumor  The various methods of treatment have been discussed with the patient and family. After consideration of risks, benefits and other options for treatment, the patient has consented to  Procedure(s) with comments: CRANIOTOMY TUMOR EXCISION with [facility] (N/A) - CRANIOTOMY TUMOR EXCISION with [place] (N/A) as a surgical intervention .  The patient's history has been reviewed, patient examined, no change in status, stable for surgery.  I have reviewed the patient's chart and labs.  Questions were answered to the patient's satisfaction.     [name] D

## 2018-06-05 ENCOUNTER — Encounter (HOSPITAL_COMMUNITY): Payer: Self-pay | Admitting: Neurosurgery

## 2018-06-05 ENCOUNTER — Other Ambulatory Visit: Payer: Self-pay

## 2018-06-05 ENCOUNTER — Encounter: Payer: Self-pay | Admitting: Oncology

## 2018-06-05 DIAGNOSIS — C189 Malignant neoplasm of colon, unspecified: Secondary | ICD-10-CM | POA: Diagnosis present

## 2018-06-05 DIAGNOSIS — E785 Hyperlipidemia, unspecified: Secondary | ICD-10-CM | POA: Diagnosis present

## 2018-06-05 DIAGNOSIS — I2510 Atherosclerotic heart disease of native coronary artery without angina pectoris: Secondary | ICD-10-CM | POA: Diagnosis present

## 2018-06-05 DIAGNOSIS — IMO0002 Reserved for concepts with insufficient information to code with codable children: Secondary | ICD-10-CM

## 2018-06-05 DIAGNOSIS — D649 Anemia, unspecified: Secondary | ICD-10-CM | POA: Diagnosis present

## 2018-06-05 DIAGNOSIS — Z794 Long term (current) use of insulin: Secondary | ICD-10-CM

## 2018-06-05 DIAGNOSIS — E1165 Type 2 diabetes mellitus with hyperglycemia: Secondary | ICD-10-CM

## 2018-06-05 DIAGNOSIS — I10 Essential (primary) hypertension: Secondary | ICD-10-CM | POA: Diagnosis present

## 2018-06-05 DIAGNOSIS — C7951 Secondary malignant neoplasm of bone: Secondary | ICD-10-CM

## 2018-06-05 LAB — CBC WITH DIFFERENTIAL/PLATELET
Abs Immature Granulocytes: 0.3 10*3/uL — ABNORMAL HIGH (ref 0.0–0.1)
Basophils Absolute: 0 10*3/uL (ref 0.0–0.1)
Basophils Relative: 0 %
EOS ABS: 0 10*3/uL (ref 0.0–0.7)
Eosinophils Relative: 0 %
HEMATOCRIT: 42.8 % (ref 39.0–52.0)
HEMOGLOBIN: 13.7 g/dL (ref 13.0–17.0)
IMMATURE GRANULOCYTES: 1 %
LYMPHS ABS: 0.7 10*3/uL (ref 0.7–4.0)
LYMPHS PCT: 3 %
MCH: 28.4 pg (ref 26.0–34.0)
MCHC: 32 g/dL (ref 30.0–36.0)
MCV: 88.8 fL (ref 78.0–100.0)
MONOS PCT: 6 %
Monocytes Absolute: 1.3 10*3/uL — ABNORMAL HIGH (ref 0.1–1.0)
NEUTROS PCT: 90 %
Neutro Abs: 18.6 10*3/uL — ABNORMAL HIGH (ref 1.7–7.7)
Platelets: 183 10*3/uL (ref 150–400)
RBC: 4.82 MIL/uL (ref 4.22–5.81)
RDW: 13.8 % (ref 11.5–15.5)
WBC: 21 10*3/uL — ABNORMAL HIGH (ref 4.0–10.5)

## 2018-06-05 LAB — GLUCOSE, CAPILLARY
GLUCOSE-CAPILLARY: 197 mg/dL — AB (ref 65–99)
GLUCOSE-CAPILLARY: 179 mg/dL — AB (ref 65–99)
GLUCOSE-CAPILLARY: 162 mg/dL — AB (ref 65–99)
GLUCOSE-CAPILLARY: 268 mg/dL — AB (ref 65–99)
GLUCOSE-CAPILLARY: 268 mg/dL — AB (ref 65–99)
GLUCOSE-CAPILLARY: 244 mg/dL — AB (ref 65–99)
Glucose-Capillary: 117 mg/dL — ABNORMAL HIGH (ref 65–99)
Glucose-Capillary: 139 mg/dL — ABNORMAL HIGH (ref 65–99)
Glucose-Capillary: 146 mg/dL — ABNORMAL HIGH (ref 65–99)
Glucose-Capillary: 172 mg/dL — ABNORMAL HIGH (ref 65–99)
Glucose-Capillary: 191 mg/dL — ABNORMAL HIGH (ref 65–99)
Glucose-Capillary: 199 mg/dL — ABNORMAL HIGH (ref 65–99)
Glucose-Capillary: 204 mg/dL — ABNORMAL HIGH (ref 65–99)
Glucose-Capillary: 281 mg/dL — ABNORMAL HIGH (ref 65–99)
Glucose-Capillary: 324 mg/dL — ABNORMAL HIGH (ref 65–99)
Glucose-Capillary: 336 mg/dL — ABNORMAL HIGH (ref 65–99)

## 2018-06-05 LAB — BASIC METABOLIC PANEL
Anion gap: 11 (ref 5–15)
BUN: 21 mg/dL — ABNORMAL HIGH (ref 6–20)
CHLORIDE: 105 mmol/L (ref 101–111)
CO2: 20 mmol/L — ABNORMAL LOW (ref 22–32)
CREATININE: 0.76 mg/dL (ref 0.61–1.24)
Calcium: 7.9 mg/dL — ABNORMAL LOW (ref 8.9–10.3)
GFR calc non Af Amer: >60 mL/min (ref >60)
Glucose, Bld: 270 mg/dL — ABNORMAL HIGH (ref 65–99)
POTASSIUM: 4.1 mmol/L (ref 3.5–5.1)
Sodium: 136 mmol/L (ref 135–145)

## 2018-06-05 MED ORDER — FAMOTIDINE 20 MG PO TABS
20.0000 mg | ORAL_TABLET | Freq: Two times a day (BID) | ORAL | Status: DC
  Administered 2018-06-05 – 2018-06-06 (×2): 20 mg via ORAL
  Filled 2018-06-05 (×2): qty 1

## 2018-06-05 MED ORDER — DEXAMETHASONE 4 MG PO TABS
4.0000 mg | ORAL_TABLET | Freq: Four times a day (QID) | ORAL | Status: AC
  Administered 2018-06-05 – 2018-06-06 (×3): 4 mg via ORAL
  Filled 2018-06-05 (×3): qty 1

## 2018-06-05 MED ORDER — DEXAMETHASONE 4 MG PO TABS: 4.0000 mg | ORAL_TABLET | Freq: Three times a day (TID) | ORAL | Status: DC

## 2018-06-05 MED ORDER — INSULIN ASPART 100 UNIT/ML ~~LOC~~ SOLN
0.0000 [IU] | SUBCUTANEOUS | Status: DC
  Administered 2018-06-05: 7 [IU] via SUBCUTANEOUS
  Administered 2018-06-05: 4 [IU] via SUBCUTANEOUS
  Administered 2018-06-05: 3 [IU] via SUBCUTANEOUS
  Administered 2018-06-05: 11 [IU] via SUBCUTANEOUS
  Administered 2018-06-06: 4 [IU] via SUBCUTANEOUS
  Administered 2018-06-06: 15 [IU] via SUBCUTANEOUS
  Administered 2018-06-06: 4 [IU] via SUBCUTANEOUS

## 2018-06-05 MED ORDER — INSULIN ASPART 100 UNIT/ML ~~LOC~~ SOLN: 0.0000 [IU] | Freq: Every day | SUBCUTANEOUS | Status: DC

## 2018-06-05 MED ORDER — INSULIN ASPART 100 UNIT/ML ~~LOC~~ SOLN: 0.0000 [IU] | Freq: Three times a day (TID) | SUBCUTANEOUS | Status: DC

## 2018-06-05 MED ORDER — INSULIN GLARGINE 100 UNIT/ML ~~LOC~~ SOLN
15.0000 [IU] | Freq: Every day | SUBCUTANEOUS | Status: DC
  Administered 2018-06-05: 15 [IU] via SUBCUTANEOUS
  Filled 2018-06-05: qty 0.15

## 2018-06-05 MED ORDER — INSULIN GLARGINE 100 UNIT/ML ~~LOC~~ SOLN: 15.0000 [IU] | Freq: Every day | SUBCUTANEOUS | Status: DC

## 2018-06-05 MED ORDER — INSULIN ASPART 100 UNIT/ML ~~LOC~~ SOLN
8.0000 [IU] | Freq: Three times a day (TID) | SUBCUTANEOUS | Status: DC
  Administered 2018-06-05 – 2018-06-06 (×2): 8 [IU] via SUBCUTANEOUS

## 2018-06-05 NOTE — Consult Note (Addendum)
Consultation Note   Allen Glenn UKG:[id number] DOB: 11/25/1956 DOA: 06/04/2018   PCP: [name], MD   Consulting physician: Evangeline Gula  Requesting physician: Vertell Limber  Reason for consultation: Uncontrolled diabetes  HPI: Allen Glenn is a 62 y.o. male with medical history significant for diabetes diagnosed March 2019, metastatic colon cancer initially diagnosed in 2016 with documented disease progression into lungs December 2018 and more recently brain metastasis May 2019.  Patient also has a history of CAD, dyslipidemia, hypertension, anemia and BPH.  Patient was discharged from the hospital on 3 after an admission for intractable nausea and vomiting of uncertain etiology.  Later evaluation revealed brain metastasis 0.5 cm mass in the left posterior fossa with several other subcentimeter masses.  This was associated with cerebral edema.  Patient was started on twice daily Decadron with significant improvement in his nausea and vomiting symptoms and return to the hospital on 6/10 to undergo craniotomy and resection of mass.  Patient has done well in the postoperative period.  He no longer has any nausea or vomiting and has tolerated oral diet.  Ultimately he has had uncontrolled diabetes with the addition of Decadron to his regimen but has not had any evidence of DKA.  In the immediate postop period to ensure adequate diabetes control in a patient that was NPO he was started on an insulin infusion.  Because of poorly controlled diabetes we have been asked to assist with the patient's care.   Review of Systems:  In addition to the HPI above,  No Fever-chills, myalgias or other constitutional symptoms No new headache, changes with Vision or hearing, new weakness, tingling, numbness in any extremity, dizziness, dysarthria or word finding difficulty, gait disturbance or imbalance, tremors or seizure activity No problems swallowing food or Liquids, indigestion/reflux, choking or coughing while  eating, abdominal pain with or after eating No Chest pain, Cough or Shortness of Breath, palpitations, orthopnea or DOE No Abdominal pain, N/V, melena,hematochezia, dark tarry stools, constipation No dysuria, malodorous urine, hematuria or flank pain No new skin rashes, lesions, masses or bruises, No new joint pains, aches, swelling or redness No recent unintentional weight gain or loss No polyuria, polydypsia or polyphagia   Past Medical History:  Diagnosis Date  . Acute MI ([place]) 2014   acute ST elevation MI  . Anemia   . Colon cancer ([place]) 2016   Status post resection of colon mass is well as he panic metastasis.   . Coronary artery disease   . Diabetes mellitus without complication ([place])   . Enlarged prostate    slightly and takes Flomax daily  . Heart disease   . History of blood transfusion   . Hyperlipidemia    takes Lipitor daily  . Hypertension    takes Lisinopril daily  . Lung nodule    left  . Nocturia   . Numbness    left foot  . Pancreatitis   . Peripheral neuropathy     Past Surgical History:  Procedure Laterality Date  . 1/8 of liver removed    . APPENDECTOMY    . BIOPSY  05/22/2018   Procedure: BIOPSY;  Surgeon: Carol Ada, MD;  Location: [facility];  Service: Endoscopy;;  . COLONOSCOPY    . CORONARY ANGIOPLASTY     1 stent  . CORONARY STENT PLACEMENT  06-27-2013  . ESOPHAGOGASTRODUODENOSCOPY Left 05/22/2018   Procedure: ESOPHAGOGASTRODUODENOSCOPY (EGD);  Surgeon: Carol Ada, MD;  Location: [facility];  Service: Endoscopy;  Laterality: Left;  . HERNIA  REPAIR Left 1991  . LAPAROSCOPIC RIGHT COLECTOMY  2016   [place]  . LIVER LOBECTOMY Right 09/28/2015    [facility] hepate[facility] at [facility]  . LUNG BIOPSY Left 11/28/2016   Procedure: LUNG BIOPSY, left upper lobe;  Surgeon: [name], MD;  Location: [facility];  Service: Thoracic;  Laterality: Left;  Marland Kitchen VIDEO BRONCHOSCOPY WITH ENDOBRONCHIAL NAVIGATION N/A 11/28/2016   Procedure: VIDEO  BRONCHOSCOPY WITH ENDOBRONCHIAL NAVIGATION;  Surgeon: [name], MD;  Location: [place];  Service: Thoracic;  Laterality: N/A;  . VIDEO BRONCHOSCOPY WITH ENDOBRONCHIAL ULTRASOUND N/A 11/28/2016   Procedure: VIDEO BRONCHOSCOPY WITH ENDOBRONCHIAL ULTRASOUND;  Surgeon: [name], MD;  Location: [place];  Service: Thoracic;  Laterality: N/A;    Social History   Socioeconomic History  . Marital status: Married    Spouse name: [name]  . Number of children: 2  . Years of education: Bachelor  . Highest education level: Not on file  Occupational History  . Occupation: Company secretary  Social Needs  . Financial resource strain: Not on file  . Food insecurity:    Worry: Not on file    Inability: Not on file  . Transportation needs:    Medical: Not on file    Non-medical: Not on file  Tobacco Use  . Smoking status: Never Smoker  . Smokeless tobacco: Never Used  Substance and Sexual Activity  . Alcohol use: No    Alcohol/week: 0.0 oz  . Drug use: No  . Sexual activity: Not on file  Lifestyle  . Physical activity:    Days per week: Not on file    Minutes per session: Not on file  . Stress: Not on file  Relationships  . Social connections:    Talks on phone: Not on file    Gets together: Not on file    Attends religious service: Not on file    Active member of club or organization: Not on file    Attends meetings of clubs or organizations: Not on file    Relationship status: Not on file  . Intimate partner violence:    Fear of current or ex partner: Not on file    Emotionally abused: Not on file    Physically abused: Not on file    Forced sexual activity: Not on file  Other Topics Concern  . Not on file  Social History Narrative   Live at home with wife, [name]   Patient is a [employer] manager, active and goes to gym   Caffeine: very little soda, occasional tea. 12oz-16oz/per    Mobility: Independent Work history: Not obtained   No Known Allergies  Family History    Problem Relation Age of Onset  . Hypertension Mother   . Heart disease Mother   . Hypertension Father   . Heart disease Father   . Neuropathy Neg Hx      Prior to Admission medications   Medication Sig Start Date End Date Taking? Authorizing Provider  acetaminophen (TYLENOL) 500 MG tablet Take 1,000 mg by mouth every 6 (six) hours as needed (FOR PAIN/HEADACHES.).   Yes [provider]  atorvastatin (LIPITOR) 10 MG tablet Take 10 mg by mouth daily.    Yes [provider]  Coenzyme Q10 (COQ10) 200 MG CAPS Take 200 mg by mouth daily.   Yes [provider]  dexamethasone (DECADRON) 4 MG tablet Take 2 tablets (8 mg total) by mouth 2 (two) times daily. 05/26/18  Yes Mikhail, [place], DO  [place]  100 UNIT/ML KiwkPen Inject 2-15 Units into the skin See admin instructions. 121-150=2 UNITS, 151-200=3 UNITS, 201-250=5 UNITS, 251-300=8 UNITS, 301-350=11 UNITS, 351-400=15 UNITS 05/26/18  Yes [provider]  Insulin Glargine (BASAGLAR KWIKPEN) 100 UNIT/ML SOPN Inject 0.12 mLs (12 Units total) into the skin at bedtime. 05/26/18  Yes Mikhail, Velta Addison, DO  lisinopril (PRINIVIL,ZESTRIL) 2.5 MG tablet Take 2.5 mg by mouth daily. 02/16/15  Yes [provider]  methocarbamol (ROBAXIN) 750 MG tablet Take 1 tablet (750 mg total) by mouth every 8 (eight) hours as needed for muscle spasms. Patient taking differently: Take 750 mg by mouth See admin instructions. Take 1 tablet by mouth every 8 hours as needed for spasms (SCHEDULED AT BEDTIME) 05/26/18  Yes Mikhail, Velta Addison, DO  nitroGLYCERIN (NITROSTAT) 0.4 MG SL tablet Place 0.4 mg under the tongue every 5 (five) minutes as needed for chest pain. CALL 911 IF PAIN PERSISTS 11/24/14  Yes [provider]  ondansetron (ZOFRAN-ODT) 4 MG disintegrating tablet Take 1 tablet (4 mg total) by mouth every 8 (eight) hours as needed for nausea or vomiting. 05/26/18  Yes Mikhail, Velta Addison, DO  tamsulosin (FLOMAX) 0.4 MG CAPS capsule  Take 0.4 mg by mouth daily after breakfast. 30 minutes after breakfast 02/24/15  Yes [provider]  trolamine salicylate (ASPERCREME) 10 % cream Apply 1 application topically 2 (two) times daily as needed (FOR BACK PAIN.).   Yes [provider]  zolpidem (AMBIEN) 5 MG tablet Take 1 tablet (5 mg total) by mouth at bedtime as needed for sleep. 05/26/18  Yes Mikhail, Velta Addison, DO  aspirin EC 81 MG tablet Take 81 mg by mouth daily. 06/30/13   [provider]  Insulin Pen Needle 31G X 5 MM MISC Use with insulin pen. 05/26/18   Cristal Ford, DO    Physical Exam: Vitals:   06/05/18 0800 06/05/18 0900 06/05/18 1000 06/05/18 1100  BP: 135/80 138/74 (!) 141/75 (!) 142/73  Pulse: (!) 51 65 75 83  Resp: 20 18 (!) 22 (!) 22  Temp:    98 F (36.7 C)  TempSrc:    Oral  SpO2: 98% 98% 98% 98%  Weight:      Height:          Constitutional: NAD, calm, comfortable Eyes: PERRL, lids and conjunctivae normal ENMT: Mucous membranes are moist. Posterior pharynx clear of any exudate or lesions.Normal dentition.  Neck: normal, supple, no masses, no thyromegaly Respiratory: clear to auscultation bilaterally, no wheezing, no crackles. Normal respiratory effort. No accessory muscle use.  Cardiovascular: Regular rate and rhythm, no murmurs / rubs / gallops. No extremity edema. 2+ pedal pulses. No carotid bruits.  Abdomen: no tenderness, no masses palpated. No hepatosplenomegaly. Bowel sounds positive.  Musculoskeletal: no clubbing / cyanosis. No joint deformity upper and lower extremities. Good ROM, no contractures. Normal muscle tone.  Skin: no rashes, lesions, ulcers. No induration.  Dressing dry and intact to cranium. Neurologic: CN 2-12 grossly intact. Sensation intact, DTR normal. Strength 5/5 x all 4 extremities.  Psychiatric: Normal judgment and insight. Alert and oriented x 3. Normal mood.    Labs on Admission: I have personally reviewed following labs and imaging  studies  CBC: Recent Labs  Lab 06/04/18 1030  HGB 13.6  HCT 40.9   Basic Metabolic Panel: Recent Labs  Lab 06/04/18 1030 06/04/18 1722  NA 137  --   K 4.1  --   GLUCOSE 172* 411*   GFR: Estimated Creatinine Clearance: 109.9 mL/min (by C-G formula based on  SCr of 0.64 mg/dL). Liver Function Tests: No results for input(s): AST, ALT, ALKPHOS, BILITOT, PROT, ALBUMIN in the last 168 hours. No results for input(s): LIPASE, AMYLASE in the last 168 hours. No results for input(s): AMMONIA in the last 168 hours. Coagulation Profile: No results for input(s): INR, PROTIME in the last 168 hours. Cardiac Enzymes: No results for input(s): CKTOTAL, CKMB, CKMBINDEX, TROPONINI in the last 168 hours. BNP (last 3 results) No results for input(s): PROBNP in the last 8760 hours. HbA1C: No results for input(s): HGBA1C in the last 72 hours. CBG: Recent Labs  Lab 06/05/18 0626 06/05/18 0739 06/05/18 0856 06/05/18 1011 06/05/18 1123  GLUCAP 146* 117* 162* 336* 324*   Lipid Profile: No results for input(s): CHOL, HDL, LDLCALC, TRIG, CHOLHDL, LDLDIRECT in the last 72 hours. Thyroid Function Tests: No results for input(s): TSH, T4TOTAL, FREET4, T3FREE, THYROIDAB in the last 72 hours. Anemia Panel: No results for input(s): VITAMINB12, FOLATE, FERRITIN, TIBC, IRON, RETICCTPCT in the last 72 hours. Urine analysis:    Component Value Date/Time   COLORURINE YELLOW 05/19/2018 1825   APPEARANCEUR CLEAR 05/19/2018 1825   LABSPEC >1.030 (H) 05/19/2018 1825   PHURINE 6.0 05/19/2018 1825   GLUCOSEU NEGATIVE 05/19/2018 1825   HGBUR NEGATIVE 05/19/2018 1825   BILIRUBINUR NEGATIVE 05/19/2018 1825   KETONESUR 15 (A) 05/19/2018 1825   PROTEINUR NEGATIVE 05/19/2018 1825   NITRITE NEGATIVE 05/19/2018 1825   LEUKOCYTESUR NEGATIVE 05/19/2018 1825   Sepsis Labs: @LABRCNTIP (procalcitonin:4,lacticidven:4) ) Recent Results (from the past 240 hour(s))  MRSA PCR Screening     Status: None   Collection  Time: 06/04/18  1:15 PM  Result Value Ref Range Status   MRSA by PCR NEGATIVE NEGATIVE Final    Comment:        The GeneXpert MRSA Assay (FDA approved for NASAL specimens only), is one component of a comprehensive MRSA colonization surveillance program. It is not intended to diagnose MRSA infection nor to guide or monitor treatment for MRSA infections. Performed at [facility], [place] [address], [place], [place] [address]      Radiological Exams on Admission: No results found.    Assessment/Plan Principal Problem:   Insulin dependent type 2 diabetes mellitus, uncontrolled  -Newly diagnosed diabetic (March 2019) who has persistent uncontrolled diabetes in the context of requirement of steroid therapy for recent cerebral edema -I have reviewed with patient's wife (who is a diabetes educator) his home regimen and adjustments she made at home due to uncontrolled diabetes prior to admission.  I have also reviewed the inpatient diabetes coordinator's notes and recommendations for therapy -Start Lantus 12 units twice daily(was on 15 units at at bedtime at home) -Resistant SSI every 4 hours and once proves glycemic stability can decrease to AC/at bedtime (was on moderate sliding scale at home) -NovoLog 8 units 3 times daily AC meal coverage (was on 5 units at home) -Of note slow steroid taper in progress therefore insulin requirements are likely to change after discharge-wife is diabetes educator therefore do not anticipate any difficulties with medication adjustments after discharge  Active Problems:   Coronary artery disease -Currently asymptomatic -Low-dose aspirin on hold postoperatively -Continue statin -Not on beta-blocker prior to admission    Hyperlipidemia -Continue statin    Hypertension -Continue ACE inhibitor    Anemia -Hemoglobin stable -Etiology likely multifactorial secondary to malignancy and chronic disease    Colon cancer metastasized to multiple sites  (LIVER, BRAIN, & LUNG) -Followed by [name] in the outpatient setting -On  office notes consideration given to possible systemic treatment options; deviously had been in observation mode without any salvage therapies indicated    **Additional lab, imaging and/or diagnostic evaluation at discretion of supervising physician    Samella Parr ANP-BC [facility] Pager [phone]   If 7PM-7AM, please contact night-coverage [email or web address] Password [facility]  06/05/2018, 12:19 PM

## 2018-06-05 NOTE — Progress Notes (Signed)
Pt admitted to 3W9 from 4N ICU.  Pt alert and oriented.  Denies pain at present.  Honeycomb dressing to left side of scalp is dry and intact.  Pt oriented to unit.  Bed alarm set, call bell within reach.  Will continue to assess.

## 2018-06-05 NOTE — Plan of Care (Signed)
Insulin gtt initiated overnight. Pain level adequately managed with PO norco and single dose IV morphine.

## 2018-06-05 NOTE — Progress Notes (Signed)
Briefly visited with patient.  He is currently transitioning off insulin drip.  He is interested in [place] CGM study at d/c.  MD, please order freestyle [place] sensors [phone] for discharge.   Thanks,  Adah Perl, RN, BC-ADM Inpatient Diabetes Coordinator Pager [phone] (8a-5p)

## 2018-06-05 NOTE — Progress Notes (Addendum)
Subjective: Patient reports "I feel ok"  Objective: Vital signs in last 24 hours: Temp:  [97.9 F (36.6 C)-98.7 F (37.1 C)] 97.9 F (36.6 C) (06/13 0700) Pulse Rate:  [51-87] 75 (06/13 1000) Resp:  [14-24] 22 (06/13 1000) BP: (122-150)/(56-81) 141/75 (06/13 1000) SpO2:  [97 %-100 %] 98 % (06/13 1000) Arterial Line BP: (73-188)/(59-89) 78/73 (06/13 0900) Weight:  [80.1 kg (176 lb 9.4 oz)] 80.1 kg (176 lb 9.4 oz) (06/12 1312)  Intake/Output from previous day: 06/12 0701 - 06/13 0700 In: 3733.7 [P.O.:860; I.V.:2723.7; IV Piggyback:150] Out: 2956 [Urine:3775; Blood:100] Intake/Output this shift: Total I/O In: 228.2 [I.V.:228.2] Out: -   Alert, conversant - fluent speech. MAEW. PEARL. Drsg left side scalp intact, dry - minimal old blood.  Lab Results: Recent Labs    06/04/18 1030  HGB 13.6  HCT 40.0   BMET Recent Labs    06/04/18 1030 06/04/18 1722  NA 137  --   K 4.1  --   GLUCOSE 172* 411*    Studies/Results: No results found.  Assessment/Plan:   LOS: 1 day  Mobilize as tolerated. Will request Hospitalist consult for medical management recommendations.    Verdis Prime 06/05/2018, 10:46 AM   Patient is doing well.  Hospitalist to help with blood sugar management. Home later today or in AM if doing well.

## 2018-06-05 NOTE — Progress Notes (Signed)
Inpatient Diabetes Program Recommendations  AACE/ADA: New Consensus Statement on Inpatient Glycemic Control (2015)  Target Ranges:  Prepandial:   less than 140 mg/dL      Peak postprandial:   less than 180 mg/dL (1-2 hours)      Critically ill patients:  140 - 180 mg/dL   Lab Results  Component Value Date   GLUCAP 324 (H) 06/05/2018   HGBA1C 7.9 (H) 05/30/2018    Spoke with NP, Erin Hearing regarding transition orders off insulin drip.  Note that patient's blood sugars increased with steroids and stress of surgery.  Will follow.   Thanks,  Adah Perl, RN, BC-ADM Inpatient Diabetes Coordinator Pager [phone] (8a-5p)

## 2018-06-05 NOTE — Progress Notes (Signed)
Patient ID: Allen Glenn, male   DOB: Mar 02, 1956, 62 y.o.   MRN: [id number] Alert, conversant, reporting no problems. Wife present and appropriately attentive. Plan for likely d/c in am. Will mobilize this afternoon.

## 2018-06-05 NOTE — Progress Notes (Signed)
Inpatient Diabetes Program Recommendations  AACE/ADA: New Consensus Statement on Inpatient Glycemic Control (2015)  Target Ranges:  Prepandial:   less than 140 mg/dL      Peak postprandial:   less than 180 mg/dL (1-2 hours)      Critically ill patients:  140 - 180 mg/dL   Lab Results  Component Value Date   GLUCAP 146 (H) 06/05/2018   HGBA1C 7.9 (H) 05/30/2018   Diabetes history: Type 2 DM  Outpatient Diabetes medications: Basaglar 12 units q HS, Humalog 2-15 units tid with meals, Decadron 8 mg bid Current orders for Inpatient glycemic control:  IV insulin- ICU order set Novolog 0-20 units tid with meals and HS, Novolog 4 units tid with meals, Lantus 12 units q HS Inpatient Diabetes Program Recommendations:    Inpatient Diabetes Program Recommendations:   Blood sugars improved greatly overnight.  Patient is eating and therefore should likely transition this morning.    Consider Levemir 12 units bid (give 2 hours prior to d/c of insulin drip), Novolog 8 units tid with meals, and Novolog resistant q 4 hours.  As steroids are titrated, insulin will also need to be reduced.  May consider hospitalist consult as well for DM management during hospitalization.    Thanks,  Adah Perl, RN, BC-ADM Inpatient Diabetes Coordinator Pager [phone] (8a-5p)

## 2018-06-06 ENCOUNTER — Encounter: Payer: Self-pay | Admitting: Oncology

## 2018-06-06 DIAGNOSIS — E785 Hyperlipidemia, unspecified: Secondary | ICD-10-CM

## 2018-06-06 DIAGNOSIS — D649 Anemia, unspecified: Secondary | ICD-10-CM

## 2018-06-06 DIAGNOSIS — C7931 Secondary malignant neoplasm of brain: Principal | ICD-10-CM

## 2018-06-06 DIAGNOSIS — E1165 Type 2 diabetes mellitus with hyperglycemia: Secondary | ICD-10-CM

## 2018-06-06 DIAGNOSIS — Z794 Long term (current) use of insulin: Secondary | ICD-10-CM

## 2018-06-06 DIAGNOSIS — I10 Essential (primary) hypertension: Secondary | ICD-10-CM

## 2018-06-06 DIAGNOSIS — C189 Malignant neoplasm of colon, unspecified: Secondary | ICD-10-CM

## 2018-06-06 LAB — GLUCOSE, CAPILLARY
GLUCOSE-CAPILLARY: 308 mg/dL — AB (ref 65–99)
Glucose-Capillary: 162 mg/dL — ABNORMAL HIGH (ref 65–99)

## 2018-06-06 MED ORDER — FREESTYLE LIBRE 14 DAY SENSOR MISC: 1.0000 "application " | 6 refills | Status: DC

## 2018-06-06 MED ORDER — HYDROCODONE-ACETAMINOPHEN 5-325 MG PO TABS: 1.0000 | ORAL_TABLET | ORAL | 0 refills | Status: DC | PRN

## 2018-06-06 MED ORDER — DEXAMETHASONE 4 MG PO TABS: 4.0000 mg | ORAL_TABLET | Freq: Two times a day (BID) | ORAL | 0 refills | Status: DC

## 2018-06-06 NOTE — Progress Notes (Addendum)
Subjective: Patient reports "I feel good...slept well"  Objective: Vital signs in last 24 hours: Temp:  [98 F (36.7 C)-98.4 F (36.9 C)] 98.3 F (36.8 C) (06/14 0536) Pulse Rate:  [51-83] 69 (06/14 0536) Resp:  [16-32] 18 (06/14 0536) BP: (124-142)/(64-80) 135/73 (06/14 0536) SpO2:  [97 %-100 %] 98 % (06/14 0536) Arterial Line BP: (78-155)/(73-74) 78/73 (06/13 0900)  Intake/Output from previous day: 06/13 0701 - 06/14 0700 In: 427.6 [I.V.:427.6] Out: 400 [Urine:400] Intake/Output this shift: No intake/output data recorded.  Alert, conversant. Wife present. Pt denies pain or discomfort. PEARL. MAEW. Left post-auricular drsg intact with minimal amt old blood.   Lab Results: Recent Labs    06/04/18 1030 06/05/18 1350  WBC  --  21.0*  HGB 13.6 13.7  HCT 40.0 42.8  PLT  --  183   BMET Recent Labs    06/04/18 1030 06/04/18 1722 06/05/18 1350  NA 137  --  136  K 4.1  --  4.1  CL  --   --  105  CO2  --   --  20*  GLUCOSE 172* 411* 270*  BUN  --   --  21*  CREATININE  --   --  0.76  CALCIUM  --   --  7.9*    Studies/Results: No results found.  Assessment/Plan:   LOS: 2 days  Per Dr. Vertell Limber, d/c to home. Decadron 4mg  po bid will be eRx'ed to pts pharmacy. Office f/u in 2 weeks for staple removal. (Pt plans to see [name] on Wednesday.)  Ok to remove drsg. Ok to shower. No lotions, creams, or ointments to incision; ok to leave open to air. No tight-fitting hats/caps.    Verdis Prime 06/06/2018, 7:17 AM  Patient is doing well.  Discharge home this morning.

## 2018-06-06 NOTE — Progress Notes (Signed)
PROGRESS NOTE    JI FELDNER  DQQ:[id number] DOB: January 26, 1956 DOA: 06/04/2018 PCP: [name], MD   Brief Narrative:  Consult for diabetes mellitus.  Assessment & Plan   Diabetes mellitus, type II -Patient currently has uncontrolled blood sugars given the use of Decadron for brain metastasis secondary to colon cancer -Suspected as steroids are tapered, patient's movements will also change -Continue Lantus, sliding scale upon discharge along with meal coverage -Diabetes coordinator consulted and appreciated -Have ordered Freestyle Libre  Metastatic colon cancer -Status post radiation and craniotomy tumor excision -neurosurgery primary -Patient will follow-up with [name], oncologist  Coronary artery disease -Currently asymptomatic and chest pain-free -Patient may resume his aspirin as recommended by neurosurgery -Continue statin, lisinopril  Hyperlipidemia -Continue statin  Essential hypertension -Stable, continue lisinopril  Anemia, chronic -Multifactorial including malignancy as well as chronic disease -hemoglobin currently stable  Leukocytosis -Suspect secondary to steroid use  DVT Prophylaxis  SCDs  Code Status: Full  Family Communication: Wife at bedside  Disposition Plan: Admitted. Dispo per neurosurgery- plan for discharge to home today. Patient is medically stable for discharge.  Consultants [facility]  Procedures  Craniotomy tumor excision   Antibiotics   Anti-infectives (From admission, onward)   Start     Dose/Rate Route Frequency Ordered Stop   06/04/18 1700  ceFAZolin (ANCEF) IVPB 2g/100 mL premix     2 g 200 mL/hr over 30 Minutes Intravenous Every 8 hours 06/04/18 1309 06/05/18 0220   06/04/18 0715  ceFAZolin (ANCEF) IVPB 2g/100 mL premix     2 g 200 mL/hr over 30 Minutes Intravenous On call to O.R. 06/04/18 0700 06/04/18 0855   06/04/18 0703  ceFAZolin (ANCEF) 2-4 GM/100ML-% IVPB    Note to Pharmacy:  Tamsen Snider   : cabinet  override      06/04/18 0703 06/04/18 0855      Subjective:   [name] Winther seen and examined today. Feeling much better today.  No complaints. Currently denies chest pain, shortness of breath, abdominal pain, nausea or vomiting, diarrhea or constipation, headache or dizziness.  Objective:   Vitals:   06/06/18 0033 06/06/18 0033 06/06/18 0536 06/06/18 0836  BP: 124/73 124/73 135/73 138/68  Pulse: 76 76 69 78  Resp: 18 18 18 18   Temp: 98.2 F (36.8 C) 98.2 F (36.8 C) 98.3 F (36.8 C) 98.1 F (36.7 C)  TempSrc: Oral Oral Oral Oral  SpO2: 97% 97% 98% 97%  Weight:      Height:        Intake/Output Summary (Last 24 hours) at 06/06/2018 1036 Last data filed at 06/05/2018 1600 Gross per 24 hour  Intake 199.38 ml  Output -  Net 199.38 ml   Filed Weights   06/04/18 0730 06/04/18 1312  Weight: 94.8 kg (208 lb 14.4 oz) 80.1 kg (176 lb 9.4 oz)    Exam  General: Well developed, well nourished, NAD, appears stated age  HEENT: NCAT, PERRLA, EOMI, Anicteic Sclera, mucous membranes moist.  Dressing in place (posterior left)  Neck: Supple, no JVD, no masses  Cardiovascular: S1 S2 auscultated, no rubs, murmurs or gallops. Regular rate and rhythm.  Respiratory: Clear to auscultation bilaterally with equal chest rise  Abdomen: Soft, nontender, nondistended, + bowel sounds  Extremities: warm dry without cyanosis clubbing or edema  Neuro: AAOx3, nonfocal  Skin: Without rashes exudates or nodules  Psych: Normal affect and demeanor with intact judgement and insight   Data Reviewed: I have personally reviewed following labs and imaging studies  CBC:  Recent Labs  Lab 06/04/18 1030 06/05/18 1350  WBC  --  21.0*  NEUTROABS  --  18.6*  HGB 13.6 13.7  HCT 40.0 42.8  MCV  --  88.8  PLT  --  188   Basic Metabolic Panel: Recent Labs  Lab 06/04/18 1030 06/04/18 1722 06/05/18 1350  NA 137  --  136  K 4.1  --  4.1  CL  --   --  105  CO2  --   --  20*  GLUCOSE 172* 411*  270*  BUN  --   --  21*  CREATININE  --   --  0.76  CALCIUM  --   --  7.9*   GFR: Estimated Creatinine Clearance: 109.9 mL/min (by C-G formula based on SCr of 0.76 mg/dL). Liver Function Tests: No results for input(s): AST, ALT, ALKPHOS, BILITOT, PROT, ALBUMIN in the last 168 hours. No results for input(s): LIPASE, AMYLASE in the last 168 hours. No results for input(s): AMMONIA in the last 168 hours. Coagulation Profile: No results for input(s): INR, PROTIME in the last 168 hours. Cardiac Enzymes: No results for input(s): CKTOTAL, CKMB, CKMBINDEX, TROPONINI in the last 168 hours. BNP (last 3 results) No results for input(s): PROBNP in the last 8760 hours. HbA1C: No results for input(s): HGBA1C in the last 72 hours. CBG: Recent Labs  Lab 06/05/18 1636 06/05/18 1948 06/05/18 2348 06/06/18 0432 06/06/18 0808  GLUCAP 172* 281* 199* 162* 308*   Lipid Profile: No results for input(s): CHOL, HDL, LDLCALC, TRIG, CHOLHDL, LDLDIRECT in the last 72 hours. Thyroid Function Tests: No results for input(s): TSH, T4TOTAL, FREET4, T3FREE, THYROIDAB in the last 72 hours. Anemia Panel: No results for input(s): VITAMINB12, FOLATE, FERRITIN, TIBC, IRON, RETICCTPCT in the last 72 hours. Urine analysis:    Component Value Date/Time   COLORURINE YELLOW 05/19/2018 1825   APPEARANCEUR CLEAR 05/19/2018 1825   LABSPEC >1.030 (H) 05/19/2018 1825   PHURINE 6.0 05/19/2018 1825   GLUCOSEU NEGATIVE 05/19/2018 1825   HGBUR NEGATIVE 05/19/2018 1825   BILIRUBINUR NEGATIVE 05/19/2018 1825   KETONESUR 15 (A) 05/19/2018 1825   PROTEINUR NEGATIVE 05/19/2018 1825   NITRITE NEGATIVE 05/19/2018 1825   LEUKOCYTESUR NEGATIVE 05/19/2018 1825   Sepsis Labs: @LABRCNTIP (procalcitonin:4,lacticidven:4)  ) Recent Results (from the past 240 hour(s))  MRSA PCR Screening     Status: None   Collection Time: 06/04/18  1:15 PM  Result Value Ref Range Status   MRSA by PCR NEGATIVE NEGATIVE Final    Comment:          The GeneXpert MRSA Assay (FDA approved for NASAL specimens only), is one component of a comprehensive MRSA colonization surveillance program. It is not intended to diagnose MRSA infection nor to guide or monitor treatment for MRSA infections. Performed at [facility], [place] [address], [place], [place] [address]       Radiology Studies: No results found.   Scheduled Meds: . atorvastatin  10 mg Oral Daily  . dexamethasone  4 mg Oral Q8H  . famotidine  20 mg Oral BID  . insulin aspart  0-20 Units Subcutaneous Q4H  . insulin aspart  8 Units Subcutaneous TID WC  . insulin glargine  15 Units Subcutaneous QHS  . lisinopril  2.5 mg Oral Daily  . methocarbamol  750 mg Oral QHS  . tamsulosin  0.4 mg Oral QPC breakfast   Continuous Infusions:   LOS: 2 days   Time Spent in minutes   30 minutes  Terral Cooks D.O. on 06/06/2018 at 10:36 AM  Between 7am to 7pm - Pager - [phone]  After 7pm go to [email or web address] - password [email or web address]  And look for the night coverage person covering for me after hours  [facility] Office  [phone]

## 2018-06-06 NOTE — Discharge Summary (Signed)
Physician Discharge Summary  Patient ID: Allen Glenn MRN: [id number] DOB/AGE: [birth year] 62 y.o.  Admit date: 06/04/2018 Discharge date: 06/06/2018  Admission Diagnoses: metastatic brain tumor left cerebello-pontine angle    Discharge Diagnoses: metastatic brain tumor left cerebello-pontine angle s/p CRANIOTOMY TUMOR EXCISION with Brainlab (Left) - CRANIOTOMY TUMOR EXCISION with [facility] APPLICATION OF CRANIAL NAVIGATION (Left) with microdissection     Principal Problem:   Insulin dependent type 2 diabetes mellitus, uncontrolled (HCC) Active Problems:   Metastasis to brain [facility])   Coronary artery disease   Hyperlipidemia   Colon cancer metastasized to multiple sites (LIVER, BRAIN, & LUNG)   Hypertension   Anemia   Discharged Condition: good  Hospital Course: Allen Glenn was admitted for surgery with dx left cerebellar tumor. Following uncomplicated craniotomy, he recovered nicely and transferred to Neuro ICU for nursing care. He has mobilized well.   Consults: Hospitalist  Significant Diagnostic Studies:   Treatments: surgery: CRANIOTOMY TUMOR EXCISION with [facility] (Left) - CRANIOTOMY TUMOR EXCISION with [facility] APPLICATION OF CRANIAL NAVIGATION (Left) with microdissection    Discharge Exam: Blood pressure 135/73, pulse 69, temperature 98.3 F (36.8 C), temperature source Oral, resp. rate 18, height 6\' 2"  (1.88 m), weight 80.1 kg (176 lb 9.4 oz), SpO2 98 %. Alert, conversant. Wife present. Pt denies pain or discomfort. PEARL. MAEW. Left post-auricular drsg intact with minimal amt old blood.    Disposition:  Discharge to home. Decadron 4mg  po bid will be eRx'ed to pts pharmacy. Office f/u in 2 weeks for staple removal. (Pt plans to see [name] on Wednesday.) Ok to remove drsg. Ok to shower. No lotions, creams, or ointments to incision; ok to leave open to air. No tight-fitting hats/caps.      Allergies as of 06/06/2018   No Known  Allergies     Medication List    STOP taking these medications   aspirin EC 81 MG tablet     TAKE these medications   acetaminophen 500 MG tablet Commonly known as:  TYLENOL Take 1,000 mg by mouth every 6 (six) hours as needed (FOR PAIN/HEADACHES.).   atorvastatin 10 MG tablet Commonly known as:  LIPITOR Take 10 mg by mouth daily.   BASAGLAR KWIKPEN 100 UNIT/ML Sopn Inject 0.12 mLs (12 Units total) into the skin at bedtime.   CoQ10 200 MG Caps Take 200 mg by mouth daily.   dexamethasone 4 MG tablet Commonly known as:  DECADRON Take 1 tablet (4 mg total) by mouth 2 (two) times daily. What changed:    how much to take  when to take this   HUMALOG KWIKPEN 100 UNIT/ML KiwkPen Generic drug:  insulin lispro Inject 2-15 Units into the skin See admin instructions. 121-150=2 UNITS, 151-200=3 UNITS, 201-250=5 UNITS, 251-300=8 UNITS, 301-350=11 UNITS, 351-400=15 UNITS   HYDROcodone-acetaminophen 5-325 MG tablet Commonly known as:  NORCO/VICODIN Take 1 tablet by mouth every 4 (four) hours as needed for moderate pain.   Insulin Pen Needle 31G X 5 MM Misc Use with insulin pen.   lisinopril 2.5 MG tablet Commonly known as:  PRINIVIL,ZESTRIL Take 2.5 mg by mouth daily.   methocarbamol 750 MG tablet Commonly known as:  ROBAXIN Take 1 tablet (750 mg total) by mouth every 8 (eight) hours as needed for muscle spasms. What changed:    when to take this  additional instructions   nitroGLYCERIN 0.4 MG SL tablet Commonly known as:  NITROSTAT Place 0.4 mg under the tongue every 5 (five) minutes as needed for chest pain. CALL  911 IF PAIN PERSISTS   ondansetron 4 MG disintegrating tablet Commonly known as:  ZOFRAN-ODT Take 1 tablet (4 mg total) by mouth every 8 (eight) hours as needed for nausea or vomiting.   tamsulosin 0.4 MG Caps capsule Commonly known as:  FLOMAX Take 0.4 mg by mouth daily after breakfast. 30 minutes after breakfast   trolamine salicylate 10 %  cream Commonly known as:  ASPERCREME Apply 1 application topically 2 (two) times daily as needed (FOR BACK PAIN.).   zolpidem 5 MG tablet Commonly known as:  AMBIEN Take 1 tablet (5 mg total) by mouth at bedtime as needed for sleep.        Signed: Peggyann Shoals, MD 06/06/2018, 7:35 AM

## 2018-06-06 NOTE — Progress Notes (Signed)
Patient has signed and been given copy of consent for CGM/Freestyle [place] research study. MD also notified and wrote order for CGM sensors.  Education done regarding application and changing CGM sensor (alternate every 14 days on back of arms), 1 hour warm-up, use of glucometer/where to buy strips, how to scan CGM for glucose reading and information for PCP. Patient has been given [employer] reader and first sensor for use. Patient has also been given educational packet regarding use CGM sensor including the 1-800 toll free number for any questions, problems or needs related to the [facility] sensors or reader.  Patient to be given Rx. For sensors with prescriptions/discharge paper work.  Sensor applied by patient to (R) Arm at 0930.  Explained that glucose readings will not be available until 1 hour after application. Patient understands that Diabetes coordinator will call them 2 times after discharge (between days 7-12 after d/c from hospital and between days 30-25 after d/c from hospital). Patient verbalizes understanding of use of Freestyle Libre CGM and was told that any issues with blood sugars/diabetes will need to be addressed by PCP.  Diabetes Quality of Life Survey administered to patient.   Thanks,  Adah Perl, RN, BC-ADM Inpatient Diabetes Coordinator Pager [phone] (8a-5p)

## 2018-06-06 NOTE — Care Management Note (Signed)
Case Management Note  Patient Details  Name: Allen Glenn MRN: [id number] Date of Birth: 02-01-1956  Subjective/Objective:                    Action/Plan: Pt discharged home this am. Pt has PCP, hospital f/u and transportation home.   Expected Discharge Date:  06/06/18               Expected Discharge Plan:  Home/Self Care  In-House Referral:     Discharge planning Services     Post Acute Care Choice:    Choice offered to:     DME Arranged:    DME Agency:     HH Arranged:    HH Agency:     Status of Service:  Completed, signed off  If discussed at [employer] of Stay Meetings, dates discussed:    Additional Comments:  Pollie Friar, RN 06/06/2018, 11:23 AM

## 2018-06-09 ENCOUNTER — Telehealth: Payer: Self-pay

## 2018-06-09 ENCOUNTER — Inpatient Hospital Stay: Admission: RE | Admit: 2018-06-09 | Payer: 59 | Source: Ambulatory Visit

## 2018-06-09 ENCOUNTER — Inpatient Hospital Stay: Payer: 59 | Attending: Oncology

## 2018-06-09 DIAGNOSIS — R102 Pelvic and perineal pain: Secondary | ICD-10-CM | POA: Insufficient documentation

## 2018-06-09 DIAGNOSIS — M25552 Pain in left hip: Secondary | ICD-10-CM | POA: Insufficient documentation

## 2018-06-09 DIAGNOSIS — I252 Old myocardial infarction: Secondary | ICD-10-CM | POA: Diagnosis not present

## 2018-06-09 DIAGNOSIS — C182 Malignant neoplasm of ascending colon: Secondary | ICD-10-CM | POA: Diagnosis not present

## 2018-06-09 DIAGNOSIS — C7931 Secondary malignant neoplasm of brain: Secondary | ICD-10-CM | POA: Insufficient documentation

## 2018-06-09 DIAGNOSIS — M79605 Pain in left leg: Secondary | ICD-10-CM | POA: Insufficient documentation

## 2018-06-09 DIAGNOSIS — C787 Secondary malignant neoplasm of liver and intrahepatic bile duct: Secondary | ICD-10-CM | POA: Insufficient documentation

## 2018-06-09 DIAGNOSIS — M545 Low back pain: Secondary | ICD-10-CM | POA: Insufficient documentation

## 2018-06-09 DIAGNOSIS — I10 Essential (primary) hypertension: Secondary | ICD-10-CM | POA: Insufficient documentation

## 2018-06-09 DIAGNOSIS — I2510 Atherosclerotic heart disease of native coronary artery without angina pectoris: Secondary | ICD-10-CM | POA: Diagnosis not present

## 2018-06-09 DIAGNOSIS — E785 Hyperlipidemia, unspecified: Secondary | ICD-10-CM | POA: Diagnosis not present

## 2018-06-09 DIAGNOSIS — R918 Other nonspecific abnormal finding of lung field: Secondary | ICD-10-CM | POA: Insufficient documentation

## 2018-06-09 DIAGNOSIS — B370 Candidal stomatitis: Secondary | ICD-10-CM | POA: Diagnosis not present

## 2018-06-09 DIAGNOSIS — E119 Type 2 diabetes mellitus without complications: Secondary | ICD-10-CM | POA: Diagnosis not present

## 2018-06-09 LAB — CEA (IN HOUSE-CHCC): CEA (CHCC-In House): 20.94 ng/mL — ABNORMAL HIGH (ref 0.00–5.00)

## 2018-06-09 LAB — BASIC METABOLIC PANEL - CANCER CENTER ONLY
ANION GAP: 12 — AB (ref 3–11)
BUN: 19 mg/dL (ref 7–26)
CALCIUM: 8.7 mg/dL (ref 8.4–10.4)
CO2: 24 mmol/L (ref 22–29)
Chloride: 100 mmol/L (ref 98–109)
Creatinine: 0.78 mg/dL (ref 0.70–1.30)
GFR, Est AFR Am: >60 mL/min (ref >60)
GFR, Estimated: >60 mL/min (ref >60)
GLUCOSE: 224 mg/dL — AB (ref 70–140)
Potassium: 4.3 mmol/L (ref 3.5–5.1)
Sodium: 136 mmol/L (ref 136–145)

## 2018-06-09 MED FILL — DEXAMETHASONE 4 MG TABLET: 4 | 30 days supply | Qty: 60 | Fill #0

## 2018-06-09 MED FILL — HYDROCODON-APAP 5-325: 5-325 | 5 days supply | Qty: 30 | Fill #0

## 2018-06-09 NOTE — Telephone Encounter (Signed)
Called to inform pt of appt date/time. Voiced understanding

## 2018-06-10 ENCOUNTER — Encounter: Payer: Self-pay | Admitting: *Deleted

## 2018-06-10 ENCOUNTER — Other Ambulatory Visit: Payer: Self-pay | Admitting: *Deleted

## 2018-06-10 MED FILL — LISINOPRIL 2.5 MG TABLET: 2.5 | 90 days supply | Qty: 90 | Fill #0

## 2018-06-10 NOTE — Patient Outreach (Signed)
[facility]) Care Management  06/10/2018  Allen Glenn [birth year] [id number]   Reached patient's wife Allen Glenn on patient's cell number and completed transition of care phone call. Patient gave this [facility] written permission on 05/15/18 in the [facility] platform to communicate with his wife regarding his health issues. See transition of care template for details. [name] was in [facility] 6/12/-6/14 for a scheduled craniotomy with excision of  metastatic left cerebello-pontine angle tumor. [name] has known stage IV colon cancer with metastatic disease to the liver (05/04/15), ? lungs (11/08/16) and now brain.  Allen Glenn states they received a call yesterday asking the same questions and she states [name] is doing well. He is wearing the Freestyle Libre flash glucose monitoring system as  he is receiving both mealtime insulin and basal insulin. He remains on decadron 4 mg twice daily.  Allen Glenn also says [name] will likely need chemotherapy again and they will see his oncologist tomorrow to discuss the treatment plan.  Once treatment course established, [name] may elect to continue to participate in the [facility] Type II diabetes digital assistant platform for self management assistance. No further case management issues identified at this time, so will close to [name] Management services. Barrington Ellison RN,CCM,CDE [place] Management Coordinator Office Phone [phone] Office Fax [phone]

## 2018-06-10 NOTE — Progress Notes (Signed)
  Radiation Oncology         [phone] ________________________________  Name: Allen Glenn MRN: [id number]  Date: 05/27/2018  DOB: 05-Apr-1956  DIAGNOSIS:     ICD-10-CM   1. Brain metastases ([place]) C79.31     NARRATIVE:  The patient was brought to the [place].  Identity was confirmed.  All relevant records and images related to the planned course of therapy were reviewed.  The patient freely provided informed written consent to proceed with treatment after reviewing the details related to the planned course of therapy. The consent form was witnessed and verified by the simulation staff. Intravenous access was established for contrast administration. Then, the patient was set-up in a stable reproducible supine position for radiation therapy.  A relocatable thermoplastic stereotactic head frame was fabricated for precise immobilization.  CT images were obtained.  Surface markings were placed.  The CT images were loaded into the planning software and fused with the patient's targeting MRI scan.  Then the target and avoidance structures were contoured.  Treatment planning then occurred.  The radiation prescription was entered and confirmed.  I have requested 3D planning  I have requested a DVH of the following structures: Brain stem, brain, left eye, right eye, lenses, optic chiasm, target volumes, uninvolved brain, and normal tissue.    SPECIAL TREATMENT PROCEDURE:  The planned course of therapy using radiation constitutes a special treatment procedure. Special care is required in the management of this patient for the following reasons. This treatment constitutes a Special Treatment Procedure for the following reason: High dose per fraction requiring special monitoring for increased toxicities of treatment including daily imaging.  The special nature of the planned course of radiotherapy will require increased physician supervision and oversight to ensure patient's safety with optimal  treatment outcomes.  PLAN:  The patient will receive 14 Gy in 1 fraction to the largest tumor for a preoperative dose and 20 Gray in 1 fraction to the 4 smaller tumors.   ------------------------------------------------  Jodelle Gross, MD, PhD

## 2018-06-11 ENCOUNTER — Telehealth: Payer: Self-pay

## 2018-06-11 ENCOUNTER — Inpatient Hospital Stay: Payer: 59 | Admitting: Oncology

## 2018-06-11 VITALS — BP 129/75 | HR 67 | Temp 97.8°F | Resp 18 | Ht 74.0 in | Wt 177.8 lb

## 2018-06-11 DIAGNOSIS — C7931 Secondary malignant neoplasm of brain: Secondary | ICD-10-CM

## 2018-06-11 DIAGNOSIS — I2510 Atherosclerotic heart disease of native coronary artery without angina pectoris: Secondary | ICD-10-CM

## 2018-06-11 DIAGNOSIS — E785 Hyperlipidemia, unspecified: Secondary | ICD-10-CM

## 2018-06-11 DIAGNOSIS — C189 Malignant neoplasm of colon, unspecified: Secondary | ICD-10-CM

## 2018-06-11 DIAGNOSIS — E119 Type 2 diabetes mellitus without complications: Secondary | ICD-10-CM

## 2018-06-11 DIAGNOSIS — M545 Low back pain: Secondary | ICD-10-CM | POA: Diagnosis not present

## 2018-06-11 DIAGNOSIS — C787 Secondary malignant neoplasm of liver and intrahepatic bile duct: Secondary | ICD-10-CM | POA: Diagnosis not present

## 2018-06-11 DIAGNOSIS — R102 Pelvic and perineal pain: Secondary | ICD-10-CM | POA: Diagnosis not present

## 2018-06-11 DIAGNOSIS — M25552 Pain in left hip: Secondary | ICD-10-CM | POA: Diagnosis not present

## 2018-06-11 DIAGNOSIS — I10 Essential (primary) hypertension: Secondary | ICD-10-CM | POA: Diagnosis not present

## 2018-06-11 DIAGNOSIS — C182 Malignant neoplasm of ascending colon: Secondary | ICD-10-CM

## 2018-06-11 DIAGNOSIS — R918 Other nonspecific abnormal finding of lung field: Secondary | ICD-10-CM | POA: Diagnosis not present

## 2018-06-11 DIAGNOSIS — B370 Candidal stomatitis: Secondary | ICD-10-CM | POA: Diagnosis not present

## 2018-06-11 DIAGNOSIS — Z7189 Other specified counseling: Secondary | ICD-10-CM

## 2018-06-11 DIAGNOSIS — I252 Old myocardial infarction: Secondary | ICD-10-CM | POA: Diagnosis not present

## 2018-06-11 DIAGNOSIS — M79605 Pain in left leg: Secondary | ICD-10-CM | POA: Diagnosis not present

## 2018-06-11 MED ORDER — CLOTRIMAZOLE 10 MG MT TROC: 10.0000 mg | Freq: Four times a day (QID) | OROMUCOSAL | 0 refills | Status: DC

## 2018-06-11 MED FILL — CLOTRIMAZOLE 10 MG TROCHE: 10 | 30 days supply | Qty: 117 | Fill #0

## 2018-06-11 NOTE — Telephone Encounter (Signed)
Printed avs and calender of upcoming appointments. Per 6/19 los. Had to place infusion on the 16th due avail. And unable to reach rad. To see if Dr. Appointment could be pushed back.

## 2018-06-11 NOTE — Progress Notes (Signed)
[place] OFFICE PROGRESS NOTE   Diagnosis: Colon cancer  INTERVAL HISTORY:   [name] returns as scheduled.  He was diagnosed with brain metastases when he presented with intractable nausea/vomiting last month.  He underwent SRS to 5 brain lesions including a dominant left cerebellar lesion on 06/02/2018.  He then underwent resection of the left cerebellar metastasis 06/04/2018.  The pathology confirmed metastatic colon cancer.  Mr. Falls has recovered from surgery.  He no longer has nausea.  He reports a good appetite.  He has no balance difficulty, but has experienced several falls after dragging his feet.  He complains of pain in the left "hip" since discharge from the hospital.  He also has pain in the left testicle.  He continues Decadron.  Objective:  Vital signs in last 24 hours:  Blood pressure 129/75, pulse 67, temperature 97.8 F (36.6 C), temperature source Oral, resp. rate 18, height 6' 2"  (1.88 m), weight 177 lb 12.8 oz (80.6 kg), SpO2 100 %.    HEENT: Thrush over the tongue and buccal mucosa Lymphatics: No cervical or supraclavicular nodes Resp: Lungs clear bilaterally Cardio: Regular rate and rhythm GI: No hepatomegaly, nontender, no mass Vascular: No leg edema Neuro: Alert and oriented, the motor exam appears grossly intact in the upper and lower extremities.  He ambulates with a steady gait GU: Testes without mass, no apparent hernia at the left scrotum or inguinal region Musculoskeletal: No pain with motion of the left hip.  No tenderness at the left trochanter or iliac    Lab Results:  Lab Results  Component Value Date   WBC 21.0 (H) 06/05/2018   HGB 13.7 06/05/2018   HCT 42.8 06/05/2018   MCV 88.8 06/05/2018   PLT 183 06/05/2018   NEUTROABS 18.6 (H) 06/05/2018    CMP  Lab Results  Component Value Date   NA 136 06/09/2018   K 4.3 06/09/2018   CL 100 06/09/2018   CO2 24 06/09/2018   GLUCOSE 224 (H) 06/09/2018   BUN 19  06/09/2018   CREATININE 0.78 06/09/2018   CALCIUM 8.7 06/09/2018   PROT 5.6 (L) 05/25/2018   ALBUMIN 3.2 (L) 05/25/2018   AST 31 05/25/2018   ALT 18 05/25/2018   ALKPHOS 251 (H) 05/25/2018   BILITOT 0.5 05/25/2018   GFRNONAA >60 06/09/2018   GFRAA >60 06/09/2018    Lab Results  Component Value Date   CEA1 20.94 (H) 06/09/2018     Medications: I have reviewed the patient's current medications.   Assessment/Plan: 1. 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 No BRAFmutation  Liver biopsy 05/04/2015-metastatic adenocarcinoma consistent with a colon primary ? Staging PET scan 06/08/2015-isolated segment 4A liver lesion ? Initiation of adjuvant FOLFOX 06/13/2015 ? Restaging CT 08/09/2015 revealed a slight decrease in a borderline ileocolic node, decrease in the hepatic dome metastasis, no new lesions ? Liver resection 09/28/2015-pathology consistent with metastatic colon cancer, negative margins ? Adjuvant FOLFOX resumed 11/08/2015, oxaliplatin eliminated beginning 11/22/2015 secondary to neuropathy. He completed adjuvant chemotherapy 02/16/2016 ? Restaging chest CT 11/08/2016, compared to 05/07/2016 revealed a new 9 mm left upper lobe nodule, stable 2.2 cm right hepatic lesion ? PET scan 11/19/2016 confirmed a hypermetabolic left upper lobe nodule, hypermetabolic left paratracheal and pericardiac lymph nodes, and hypermetabolism associated with the hypoattenuating lesion in the dome of the liver ? Status post EBUSbiopsies of the left lingula nodule and a level  4Lnode on 11/28/2016-no evidence of malignancy ? CT chest 02/11/2017-increase in size of the left pulmonary nodule and epicardial lymph node ? Status post SBRT 2 the left lung nodule and mediastinum completed 03/14/2017 ? CTs 07/01/2017-new 9 mm focus along the right liver capsule,  stable left subcapsular liver lesion, radiation change at site of left upper lobe nodule, stable pericardiallymph node ? CTs 11/25/2017-new 5 mm lingular nodule, enlargement of capsular-based right liver lesion, new capsular based right liver lesion capsular lesion at the hepatic dome ? CTs 02/25/2018- enlargement of small lung nodules, liver lesions and small mesenteric lymph node ? CT abdomen/pelvis 05/15/2018- new lower lobe pulmonary nodules, enlargement of liver lesions and right lower quadrant soft tissue nodules ? 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 Brain MRI 05/26/2018- 3.5 similar left posterior fossa mass, 4 additional subcentimeter enhancing brain lesions-3 cerebellar, 1 left thalamic ? SRS to all 5 brain lesions 16 2019 ? Surgical excision of left cerebellar lesion 06/04/2018, adenocarcinoma consistent with colorectal primary 2. Coronary artery disease status post a myocardial infarction in 2014  3. Hypertension  4. Hyperlipidemia  5.    Diabetes  6.    Admission 05/20/2018 with nausea/vomiting secondary to a cerebellar metastasis  7.   Oral candidiasis 06/11/2018- started on Mycelex atrocious   Disposition: [name] is recovering from the cerebellar metastectomy.  He has progressive metastatic colon cancer.  I discussed treatment options with [name] and his wife.  They understand no therapy will be curative.  We discussed observation versus a trial of systemic therapy.  He was treated with FOLFOX in the adjuvant setting.  He developed neuropathy symptoms while on FOLFOX.  I recommend treatment with FOLFIRI.  We reviewed potential toxicities associated with the FOLFIRI regimen including the chance for nausea/vomiting, mucositis, diarrhea, alopecia, and hematologic toxicity.  [name] would like to proceed with FOLFIRI.  He has a vacation planned for next week.  We will refer him for Port-A-Cath  placement during the week of 07/01/2018.  He will return for an office visit 07/02/2018.  The plan is to begin FOLFIRI on 07/07/2018.  We will check a baseline CEA prior to beginning chemotherapy.  He will be scheduled for a restaging CT abdomen/pelvis after 5 cycles of FOLFIRI.  We decided against adding bevacizumab secondary to the recent brain surgery.  We will image the pelvis/left hip if the pain persists.  25 minutes were spent with the patient today.  The majority of the time was used for counseling and coordination of care.  Betsy Coder, MD  06/11/2018  8:53 AM

## 2018-06-11 NOTE — Progress Notes (Signed)
START ON PATHWAY REGIMEN - Colorectal     A cycle is every 14 days:     Irinotecan      Leucovorin      5-Fluorouracil      5-Fluorouracil   **Always confirm dose/schedule in your pharmacy ordering system**  Patient Characteristics: Metastatic Colorectal, First Line, Nonsurgical Candidate, KRAS Mutation Positive/Unknown, BRAF Wild-Type/Unknown, PS = 0,1; Bevacizumab Ineligible Current evidence of distant metastases<= Yes AJCC T Category: Staged < 8th Ed. AJCC N Category: Staged < 8th Ed. AJCC M Category: Staged < 8th Ed. AJCC 8 Stage Grouping: Staged < 8th Ed. BRAF Mutation Status: Wild Type (no mutation) KRAS/NRAS Mutation Status: Mutation Positive Line of therapy: First Line Performance Status: PS = 0, 1 Bevacizumab Eligibility: Ineligible Intent of Therapy: Non-Curative / Palliative Intent, Discussed with Patient

## 2018-06-12 ENCOUNTER — Encounter: Payer: Self-pay | Admitting: Oncology

## 2018-06-13 ENCOUNTER — Telehealth: Payer: Self-pay

## 2018-06-13 NOTE — Telephone Encounter (Signed)
Spoke with Theadora Rama, medical assistant at Dr. Melven Sartorius office to request that MD do a steroid taper schedule for the pt. Brandy voiced understanding. Will relay information to MD. This RN will make the pt aware.

## 2018-06-16 ENCOUNTER — Encounter: Payer: Self-pay | Admitting: Oncology

## 2018-06-16 MED FILL — METHYLPREDNISOLONE 4 MG TAB: 4 | 6 days supply | Qty: 21 | Fill #0

## 2018-06-16 MED FILL — UNIFINE PENTIPS 32GX5/32": 32G X 4 MM | 90 days supply | Qty: 300 | Fill #0

## 2018-06-16 MED FILL — HUMALOG 100 UNITS/ML KWIKPE: 100 | 25 days supply | Qty: 15 | Fill #1

## 2018-06-16 MED FILL — UNIFINE PENTIPS 32GX5/32: 32G X 4 MM | 90 days supply | Qty: 300 | Fill #0

## 2018-06-16 MED FILL — LANTUS SOLOSTAR 100 UNITS/M: 100 | 25 days supply | Qty: 3 | Fill #1

## 2018-06-17 ENCOUNTER — Other Ambulatory Visit: Payer: Self-pay | Admitting: *Deleted

## 2018-06-17 ENCOUNTER — Encounter: Payer: Self-pay | Admitting: Oncology

## 2018-06-17 DIAGNOSIS — C182 Malignant neoplasm of ascending colon: Secondary | ICD-10-CM

## 2018-06-18 ENCOUNTER — Inpatient Hospital Stay (HOSPITAL_BASED_OUTPATIENT_CLINIC_OR_DEPARTMENT_OTHER): Payer: 59 | Admitting: Medical

## 2018-06-18 ENCOUNTER — Encounter: Payer: Self-pay | Admitting: Oncology

## 2018-06-18 ENCOUNTER — Ambulatory Visit (HOSPITAL_COMMUNITY)
Admission: RE | Admit: 2018-06-18 | Discharge: 2018-06-18 | Disposition: A | Discharge status: Home / Self Care | Payer: 59 | Source: Ambulatory Visit | Attending: Oncology | Admitting: Oncology

## 2018-06-18 VITALS — BP 122/63 | HR 84 | Temp 97.8°F | Resp 17 | Ht 74.0 in | Wt 185.8 lb

## 2018-06-18 DIAGNOSIS — C787 Secondary malignant neoplasm of liver and intrahepatic bile duct: Secondary | ICD-10-CM

## 2018-06-18 DIAGNOSIS — C187 Malignant neoplasm of sigmoid colon: Secondary | ICD-10-CM | POA: Diagnosis not present

## 2018-06-18 DIAGNOSIS — R918 Other nonspecific abnormal finding of lung field: Secondary | ICD-10-CM | POA: Diagnosis not present

## 2018-06-18 DIAGNOSIS — C182 Malignant neoplasm of ascending colon: Secondary | ICD-10-CM | POA: Diagnosis not present

## 2018-06-18 DIAGNOSIS — C189 Malignant neoplasm of colon, unspecified: Secondary | ICD-10-CM | POA: Insufficient documentation

## 2018-06-18 DIAGNOSIS — I10 Essential (primary) hypertension: Secondary | ICD-10-CM | POA: Diagnosis not present

## 2018-06-18 DIAGNOSIS — M545 Low back pain: Secondary | ICD-10-CM | POA: Diagnosis not present

## 2018-06-18 DIAGNOSIS — M25552 Pain in left hip: Secondary | ICD-10-CM

## 2018-06-18 DIAGNOSIS — C7951 Secondary malignant neoplasm of bone: Secondary | ICD-10-CM | POA: Diagnosis not present

## 2018-06-18 DIAGNOSIS — R937 Abnormal findings on diagnostic imaging of other parts of musculoskeletal system: Secondary | ICD-10-CM | POA: Insufficient documentation

## 2018-06-18 DIAGNOSIS — R748 Abnormal levels of other serum enzymes: Secondary | ICD-10-CM | POA: Diagnosis not present

## 2018-06-18 DIAGNOSIS — C7931 Secondary malignant neoplasm of brain: Secondary | ICD-10-CM | POA: Diagnosis not present

## 2018-06-18 DIAGNOSIS — M5416 Radiculopathy, lumbar region: Secondary | ICD-10-CM | POA: Diagnosis not present

## 2018-06-18 DIAGNOSIS — E119 Type 2 diabetes mellitus without complications: Secondary | ICD-10-CM | POA: Diagnosis not present

## 2018-06-18 DIAGNOSIS — B370 Candidal stomatitis: Secondary | ICD-10-CM | POA: Diagnosis not present

## 2018-06-18 DIAGNOSIS — R102 Pelvic and perineal pain: Secondary | ICD-10-CM | POA: Diagnosis not present

## 2018-06-18 DIAGNOSIS — M79605 Pain in left leg: Secondary | ICD-10-CM | POA: Diagnosis not present

## 2018-06-18 MED ORDER — GABAPENTIN 300 MG PO CAPS: 300.0000 mg | ORAL_CAPSULE | Freq: Two times a day (BID) | ORAL | 1 refills | Status: DC

## 2018-06-18 MED ORDER — OXYCODONE HCL 5 MG PO TABS: ORAL_TABLET | ORAL | 0 refills | Status: DC

## 2018-06-18 MED ORDER — OXYCODONE-ACETAMINOPHEN 5-325 MG PO TABS
2.0000 | ORAL_TABLET | Freq: Once | ORAL | Status: AC
  Administered 2018-06-18: 2 via ORAL

## 2018-06-18 MED ORDER — OXYCODONE-ACETAMINOPHEN 5-325 MG PO TABS
ORAL_TABLET | ORAL | Status: AC
  Filled 2018-06-18: qty 2

## 2018-06-18 MED FILL — oxyCODONE HCL 5 MG TABS: 5 | 4 days supply | Qty: 45 | Fill #0

## 2018-06-18 MED FILL — GABAPENTIN 300 MG CAPSULE: 300 | 30 days supply | Qty: 60 | Fill #0

## 2018-06-18 NOTE — Progress Notes (Signed)
Histology and Location of Primary Cancer: Metastatic colon cancer, progression, now spine mets   Impression 06-18-18  MRI Lumbar Spine without and with contrast   Dr. Vertell Limber  1. Extensive metastatic disease throughout the visualized portion of the spine and in the pelvic bones. 2.  Pathologic fracture of L3 with extension of tumor into the left side of the spinal canal and left neural foramen.  Compression of the left side of the thecal sac with a mass effect upon the left L3 and L4 nerves.   Sites of Visceral and Bony Metastatic Disease: Spine and in the pelvic bones.  Pathologic fracture of L3 with extension of tumor into the left side of the spinal canal and left neural foramen.  Compression of the left side of the thecal sac with a mass effect upon the left L3 and L4 nerves.    Location(s) of Symptomatic Metastases: Extensive metastatic disease throughout the visualized portion of the spine and in the pelvic bones. L3 with extension of tumor into the left side of the spinal canal and left neural foramen.  Compression of the left side of the thecal sac with a mass effect upon the left L3 and L4 nerves.     Past/Anticipated chemotherapy by medical oncology, if any: Dr. Johnanna Schneiders   IMPRESSION:  06-18-18  DG HIP Unilat W Or W/O Pelvis 1V Left  There is no acute bony abnormality of the pelvis or left hip. There is mild symmetric left hip joint space loss consistent with osteoarthritis.   Assessment/Plan: 1. 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 No BRAFmutation  Liver biopsy 05/04/2015-metastatic adenocarcinoma consistent with a colon primary ? Staging PET scan 06/08/2015-isolated segment 4A liver lesion ? Initiation of adjuvant FOLFOX 06/13/2015 ? Restaging CT 08/09/2015 revealed a slight decrease in a borderline ileocolic node, decrease  in the hepatic dome metastasis, no new lesions ? Liver resection 09/28/2015-pathology consistent with metastatic colon cancer, negative margins ? Adjuvant FOLFOX resumed 11/08/2015, oxaliplatin eliminated beginning 11/22/2015 secondary to neuropathy. He completed adjuvant chemotherapy 02/16/2016 ? Restaging chest CT 11/08/2016, compared to 05/07/2016 revealed a new 9 mm left upper lobe nodule, stable 2.2 cm right hepatic lesion ? PET scan 11/19/2016 confirmed a hypermetabolic left upper lobe nodule, hypermetabolic left paratracheal and pericardiac lymph nodes, and hypermetabolism associated with the hypoattenuating lesion in the dome of the liver ? Status post EBUSbiopsies of the left lingula nodule and a level 4Lnode on 11/28/2016-no evidence of malignancy ? CT chest 02/11/2017-increase in size of the left pulmonary nodule and epicardial lymph node ? Status post SBRT 2 the left lung nodule and mediastinum completed 03/14/2017 ? CTs 07/01/2017-new 9 mm focus along the right liver capsule, stable left subcapsular liver lesion, radiation change at site of left upper lobe nodule, stable pericardiallymph node ? CTs 11/25/2017-new 5 mm lingular nodule, enlargement of capsular-based right liver lesion, new capsular based right liver lesion capsular lesion at the hepatic dome ? CTs 02/25/2018- enlargement of small lung nodules, liver lesions and small mesenteric lymph node ? CT abdomen/pelvis 05/15/2018- new lower lobe pulmonary nodules, enlargement of liver lesions and right lower quadrant soft tissue nodules ? 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 Brain MRI 05/26/2018- 3.5 similar left posterior fossa mass, 4 additional subcentimeter enhancing brain lesions-3 cerebellar, 1 left thalamic ? SRS to all 5 brain lesions 16 2019 ? Surgical excision of left  cerebellar lesion 06/04/2018, adenocarcinoma consistent with colorectal primary    Pain  on a scale of 0-10 is: No    If Spine Met(s), symptoms, if any, include:  Bowel/Bladder retention or incontinence (please describe): Normal bowel movements, voiding every 2-3 hours  Numbness or weakness in extremities (please describe): Left hand has been cramping the past few days and left foot with tingling  Current Decadron regimen, if applicable:2 mg taking Monday evening then he started a prednisone tapering.  Ambulatory status? Walker? Wheelchair?: Cane  SAFETY ISSUES: Prior radiation? [facility] 06-02-18  Brain n1 fractions of                                                                          20 Gy 03-04-17 -  03-14-17 Left lung60 Gy 5 fractions                                                                                                 Left Chest: 50 Gy in 5 fractions            Pacemaker/ICD?:No  Possible current pregnancy?: No  Is the patient on methotrexate? :No  Current Complaints / other details:   Wt Readings from Last 3 Encounters:  06/19/18 190 lb 3.2 oz (86.3 kg)  06/18/18 185 lb 12.8 oz (84.3 kg)  06/11/18 177 lb 12.8 oz (80.6 kg)  BP 123/63 (BP Location: Left Arm, Patient Position: Sitting, Cuff Size: Normal)   Pulse 98   Temp 98.2 F (36.8 C)   Resp 18   Ht _0  (1.88 m)   Wt 190 lb 3.2 oz (86.3 kg)   SpO2 97%   BMI 24.42 kg/m

## 2018-06-18 NOTE — Progress Notes (Signed)
[facility] Progress Note   Allen Glenn [id number] [birth year] 62 y.o.  Allen Glenn is managed by Dr. Dominica Severin B. Sherrill  Actively treated with chemotherapy/immunotherapy: no  Assessment: Plan:    Hip pain, acute, left - Plan: gabapentin (NEURONTIN) 300 MG capsule, oxyCODONE (OXY IR/ROXICODONE) 5 MG immediate release tablet, oxyCODONE-acetaminophen (PERCOCET/ROXICET) 5-325 MG per tablet 2 tablet  Colon cancer metastasized to multiple sites (LIVER, BRAIN, & LUNG)   Acute left hip pain: Allen Glenn continues on a Decadron taper for his recently diagnosed brain metastasis.  He will continue on those as prescribed by his neurosurgeon.  I have given him a prescription for Neurontin 300 mg with instructions to begin with 1-2 p.o. nightly.  Additionally have given him a prescription for oxycodone 5 mg, 1-2 p.o. every 4 hours as needed.  He was also given to Percocet 5-325 tablets x 1 today.  Metastatic colon cancer: Allen Glenn will be seen by radiation oncology tomorrow and will follow up with Dr. Dominica Severin B. Sherrill on 07/02/2018.  Please see After Visit Summary for patient specific instructions.  Future Appointments  Date Time Provider [place]  06/19/2018 10:30 AM Hayden Pedro, PA-C [facility] None  07/02/2018  8:00 AM [facility] LAB ONLY [facility] None  07/02/2018  8:30 AM Ladell Pier, MD [facility] None  07/03/2018 11:30 AM [facility] ROOM [facility] None  07/03/2018  1:30 PM [facility] 1 [facility] [place]  07/07/2018 11:00 AM Hayden Pedro, PA-C [facility] None  07/08/2018  7:30 AM [facility] INFUSION [facility] None  09/23/2018  8:40 AM [name], MD [facility]    No orders of the defined types were placed in this encounter.      Subjective:   Patient ID:  Allen Glenn is a 62 y.o. (DOB 12/21/1956) male.  Chief Complaint:  Chief Complaint  Patient presents with  . Back Pain    10/10 pain left lower back, left hip, and left  leg    HPI Allen Glenn is a 62 year old male with a history of a metastatic colon cancer who was recently diagnosed with brain metastasis.  He underwent SRS to 5 brain lesions.  He then underwent a resection of the left cerebellar metastasis on 06/04/2018.  He presents to the office today with left lower back and left hip and leg pain which has developed over the last week.  He reports that the pain has worsened.  He had a few hydrocodone at home leftover from his recent surgery.  He also had an old prescription for Lyrica at home.  He is taken both of these as well as Aleve without relief of his symptoms.  He rates his pain as 10/10 with the pain described as burning and aching.  He has had no trauma.  He denies fevers, chills, or sweats.  He had a left hip x-ray completed this morning which showed no acute bony abnormalities of the pelvis or left hip.  There was mild symmetric left hip joint space loss which was consistent with osteoarthritis.  The patient and his wife plan to travel to [facility] later this week with their children and grandchildren.  He is scheduled to see Dr. Erline Levine who is his neurosurgeon later today.  Medications: I have reviewed the patient's current medications.  Allergies: No Known Allergies  Past Medical History:  Diagnosis Date  . Acute MI ([place]) 2014   acute ST elevation MI  . Anemia   . Colon cancer (  [place]) 2016   Status post resection of colon mass is well as he panic metastasis.   . Coronary artery disease   . Diabetes mellitus without complication ([place])   . Enlarged prostate    slightly and takes Flomax daily  . Heart disease   . History of blood transfusion   . Hyperlipidemia    takes Lipitor daily  . Hypertension    takes Lisinopril daily  . Lung nodule    left  . Nocturia   . Numbness    left foot  . Pancreatitis   . Peripheral neuropathy     Past Surgical History:  Procedure Laterality Date  . 1/8 of liver removed    . APPENDECTOMY      . APPLICATION OF CRANIAL NAVIGATION Left 06/04/2018   Procedure: APPLICATION OF CRANIAL NAVIGATION;  Surgeon: Erline Levine, MD;  Location: [place];  Service: Neurosurgery;  Laterality: Left;  . BIOPSY  05/22/2018   Procedure: BIOPSY;  Surgeon: Carol Ada, MD;  [facility];  Service: Endoscopy;;  . BRAIN SURGERY  06/04/2018  . COLONOSCOPY    . CORONARY ANGIOPLASTY     1 stent  . CORONARY STENT PLACEMENT  06-27-2013  . CRANIOTOMY Left 06/04/2018   Procedure: CRANIOTOMY TUMOR EXCISION with Brainlab;  Surgeon: Erline Levine, MD;  Location: [place];  Service: Neurosurgery;  Laterality: Left;  CRANIOTOMY TUMOR EXCISION with Brainlab  . ESOPHAGOGASTRODUODENOSCOPY Left 05/22/2018   Procedure: ESOPHAGOGASTRODUODENOSCOPY (EGD);  Surgeon: Carol Ada, MD;  Location: [facility];  Service: Endoscopy;  Laterality: Left;  . HERNIA REPAIR Left 1991  . LAPAROSCOPIC RIGHT COLECTOMY  2016   [place]  . LIVER LOBECTOMY Right 09/28/2015    Right partial hepatectomy at [facility]  . LUNG BIOPSY Left 11/28/2016   Procedure: LUNG BIOPSY, left upper lobe;  Surgeon: [name], MD;  Location: [place];  Service: Thoracic;  Laterality: Left;  Marland Kitchen VIDEO BRONCHOSCOPY WITH ENDOBRONCHIAL NAVIGATION N/A 11/28/2016   Procedure: VIDEO BRONCHOSCOPY WITH ENDOBRONCHIAL NAVIGATION;  Surgeon: [name], MD;  Location: [facility] OR;  Service: Thoracic;  Laterality: N/A;  . VIDEO BRONCHOSCOPY WITH ENDOBRONCHIAL ULTRASOUND N/A 11/28/2016   Procedure: VIDEO BRONCHOSCOPY WITH ENDOBRONCHIAL ULTRASOUND;  Surgeon: [name], MD;  [facility];  Service: Thoracic;  Laterality: N/A;    Family History  Problem Relation Age of Onset  . Hypertension Mother   . Heart disease Mother   . Hypertension Father   . Heart disease Father   . Neuropathy Neg Hx     Social History   Socioeconomic History  . Marital status: Married    Spouse name: [name]  . Number of children: 2  . Years of education: Bachelor  . Highest  education level: Not on file  Occupational History  . Occupation: Company secretary  Social Needs  . Financial resource strain: Not on file  . Food insecurity:    Worry: Not on file    Inability: Not on file  . Transportation needs:    Medical: Not on file    Non-medical: Not on file  Tobacco Use  . Smoking status: Never Smoker  . Smokeless tobacco: Never Used  Substance and Sexual Activity  . Alcohol use: No    Alcohol/week: 0.0 oz  . Drug use: No  . Sexual activity: Not on file  Lifestyle  . Physical activity:    Days per week: Not on file    Minutes per session: Not on file  . Stress: Not on file  Relationships  .  Social connections:    Talks on phone: Not on file    Gets together: Not on file    Attends religious service: Not on file    Active member of club or organization: Not on file    Attends meetings of clubs or organizations: Not on file    Relationship status: Not on file  . Intimate partner violence:    Fear of current or ex partner: Not on file    Emotionally abused: Not on file    Physically abused: Not on file    Forced sexual activity: Not on file  Other Topics Concern  . Not on file  Social History Narrative   Live at home with wife, [name]   Patient is a [employer] manager, active and goes to gym   Caffeine: very little soda, occasional tea. 12oz-16oz/per    Past Medical History, Surgical history, Social history, and Family history were reviewed and updated as appropriate.   Please see review of systems for further details on the patient's review from today.   Review of Systems:  Review of Systems  Constitutional: Negative for chills, diaphoresis and fever.  Cardiovascular: Negative for leg swelling.  Gastrointestinal: Negative for constipation, diarrhea, nausea and vomiting.  Musculoskeletal: Positive for arthralgias, back pain and gait problem (Due to left hip pain.). Negative for myalgias.    Objective:   Physical Exam:  BP 122/63 (BP Location: Left  Arm, Patient Position: Sitting)   Pulse 84   Temp 97.8 F (36.6 C) (Oral)   Resp 17   Ht 6\' 2"  (1.88 m)   Wt 185 lb 12.8 oz (84.3 kg)   SpO2 99%   BMI 23.86 kg/m  ECOG: 1  Physical Exam  Constitutional: No distress.  HENT:  Head: Normocephalic and atraumatic.    Cardiovascular: Normal rate, regular rhythm and normal heart sounds. Exam reveals no gallop and no friction rub.  No murmur heard. Pulmonary/Chest: Effort normal and breath sounds normal. No stridor. No respiratory distress. He has no wheezes. He has no rales.  Abdominal: Soft. Bowel sounds are normal. He exhibits no distension and no mass. There is no tenderness. There is no rebound and no guarding.  Musculoskeletal: He exhibits no edema, tenderness or deformity.  Neurological: He is alert. Coordination (The patient is ambulating with the use of a wheelchair.) abnormal.  Negative left straight leg raise.  Skin: Skin is warm and dry. No rash noted. He is not diaphoretic. No erythema.  Psychiatric: He has a normal mood and affect. His behavior is normal. Judgment and thought content normal.    Lab Review:     Component Value Date/Time   NA 136 06/09/2018 0807   NA 137 11/19/2017 0822   K 4.3 06/09/2018 0807   K 4.2 11/19/2017 0822   CL 100 06/09/2018 0807   CO2 24 06/09/2018 0807   CO2 24 11/19/2017 0822   GLUCOSE 224 (H) 06/09/2018 0807   GLUCOSE 189 (H) 11/19/2017 0822   BUN 19 06/09/2018 0807   BUN 13.0 11/19/2017 0822   CREATININE 0.78 06/09/2018 0807   CREATININE 0.8 11/19/2017 0822   CALCIUM 8.7 06/09/2018 0807   CALCIUM 8.9 11/19/2017 0822   PROT 5.6 (L) 05/25/2018 0455   PROT 7.1 11/19/2017 0822   ALBUMIN 3.2 (L) 05/25/2018 0455   ALBUMIN 4.1 11/19/2017 0822   AST 31 05/25/2018 0455   AST 34 11/19/2017 0822   ALT 18 05/25/2018 0455   ALT 58 (H) 11/19/2017 0822   ALKPHOS 251 (H)  05/25/2018 0455   ALKPHOS 94 11/19/2017 0822   BILITOT 0.5 05/25/2018 0455   BILITOT 0.42 11/19/2017 0822   GFRNONAA  >60 06/09/2018 0807   GFRAA >60 06/09/2018 0807       Component Value Date/Time   WBC 21.0 (H) 06/05/2018 1350   RBC 4.82 06/05/2018 1350   HGB 13.7 06/05/2018 1350   HCT 42.8 06/05/2018 1350   PLT 183 06/05/2018 1350   MCV 88.8 06/05/2018 1350   MCH 28.4 06/05/2018 1350   MCHC 32.0 06/05/2018 1350   RDW 13.8 06/05/2018 1350   LYMPHSABS 0.7 06/05/2018 1350   MONOABS 1.3 (H) 06/05/2018 1350   EOSABS 0.0 06/05/2018 1350   BASOSABS 0.0 06/05/2018 1350   -------------------------------  Imaging from last 24 hours (if applicable):  Radiology interpretation: Ct Head W & Wo Contrast  Result Date: 05/23/2018 CLINICAL DATA:  Metastatic colon cancer with intractable nausea. Weakness and dizziness for 2 weeks. EXAM: CT HEAD WITHOUT AND WITH CONTRAST TECHNIQUE: Contiguous axial images were obtained from the base of the skull through the vertex without and with intravenous contrast CONTRAST:  53mL OMNIPAQUE IOHEXOL 300 MG/ML  SOLN COMPARISON:  PET-CT 11/19/2016 FINDINGS: Brain: Enhancing mass in the lateral left cerebellum measuring 3.4 cm, with extensive vasogenic edema crossing midline and narrowing the fourth ventricle. No hydrocephalus. No evidence of infarct or hemorrhage. Chronic small vessel ischemic type change in the cerebral white matter. Vascular: Visible vessels are patent. Skull: No acute finding or evidence of metastasis. Sinuses/Orbits: Negative These results were called by telephone at the time of interpretation on 05/23/2018 at 10:23 am to Dr. Betsy Coder , who verbally acknowledged these results. 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 There is fourth ventricular narrowing without hydrocephalus. Electronically Signed   By: Monte Fantasia M.D.   On: 05/23/2018 10:23   Ct Abdomen W Contrast  Result Date: 05/20/2018 CLINICAL DATA:  New onset nausea and vomiting. EXAM: CT ABDOMEN WITH CONTRAST TECHNIQUE: Multidetector CT imaging of the abdomen  was performed using the standard protocol following bolus administration of intravenous contrast. CONTRAST:  122mL ISOVUE-300 IOPAMIDOL (ISOVUE-300) INJECTION 61% COMPARISON:  CT scan 05/15/2018 FINDINGS: Lower chest: Stable pulmonary nodules since the recent CT scan. No acute findings at the lung bases. No pleural effusions. Hepatobiliary: Stable hepatic metastatic disease. No acute hepatic findings. Possible small gallstone dependently in the gallbladder. No CT findings to suggest acute cholecystitis. No common bile duct dilatation. Pancreas: No mass, inflammation or ductal dilatation. Spleen: Normal size.  No focal lesions. Adrenals/Urinary Tract: The adrenal glands and kidneys are unremarkable and stable. Stomach/Bowel: The stomach, duodenum, visualized small bowel and visualize colon are grossly normal. Stable appearance of the colonic anastomosis. Stable omental implant on image number 28. Vascular/Lymphatic: The aorta and branch vessels are stable. Scattered atherosclerotic calcifications. The major venous structures are patent. Small scattered mesenteric and retroperitoneal lymph nodes. Other: No ascites or abdominal wall hernia. Musculoskeletal: No significant bony findings. IMPRESSION: Stable CT appearance of the abdomen when compared to recent prior study. No new/acute findings. Suspect small gallstone or possible polyp in the gallbladder. No CT findings for acute cholecystitis. No biliary dilatation. Electronically Signed   By: Marijo Sanes M.D.   On: 05/20/2018 16:19   Mr Jeri Cos HQ Contrast  Result Date: 05/26/2018 CLINICAL DATA:  62 year old male with metastatic colon cancer. Left cerebellar mass with edema detected on head CT 05/23/2018. Started on IV Decadron. Treatment planning. EXAM: MRI HEAD WITHOUT AND WITH CONTRAST TECHNIQUE: Multiplanar,  multiecho pulse sequences of the brain and surrounding structures were obtained without and with intravenous contrast. CONTRAST:  38mL MULTIHANCE  GADOBENATE DIMEGLUMINE 529 MG/ML IV SOLN COMPARISON:  Head CT without contrast 05/23/2018. FINDINGS: Brain: Lobulated and heterogeneously enhancing rounded mass in the left lateral posterior fossa measures up to 35 millimeters maximum diameter, approximately 31 millimeters AP. On both series 3, image 29 and coronal series 11, images 15 and 16 the mass appears to be extra-axial, and there is associated dural thickening, a possible dural tail on coronal series 13, image 17. However, the mass lacks the typical diffusion imaging for meningioma. There is patchy decreased T2* signal within the lower aspect of the lesion which might be blood products or calcification. There persists moderate to severe edema throughout the left cerebellum, and edema continues to cross midline at the level of the deep cerebellar nuclei. There is mild mass effect on the 4th ventricle which remains patent. The other basilar cisterns remain patent. However, there are 4 additional sub centimeter - 3-4 mm each - round somewhat faintly enhancing lesions identified: - right cerebellum near midline series 12 images 28 and 30. - left superior cerebellar vermis image 60. - central left thalamus image 95. Some of the right hemisphere cerebellar edema might be related to the small right cerebellar lesions, but the thalamic lesion shows no edema or mass effect. No other dural thickening. There is a chronic nonenhancing microhemorrhage in the posterior right temporal lobe on series 8, image 12. No ventriculomegaly. No restricted diffusion or evidence of acute infarction. No acute intracranial hemorrhage. Negative pituitary and cervicomedullary junction. Patchy nonspecific bilateral cerebral white matter T2 and FLAIR hyperintensity. Vascular: Major intracranial vascular flow voids are preserved, the distal right vertebral artery appears dominant. The major dural venous sinuses are enhancing and appear patent, including the left sigmoid. Skull and upper  cervical spine: Negative visible cervical spine and spinal cord. Visualized bone marrow signal is within normal limits. Sinuses/Orbits: Negative. Other: Mastoid air cells remain clear. Visible internal auditory structures appear normal. Scalp and face soft tissues appear negative. IMPRESSION: 1. There is strong evidence that the lobulated and enhancing 35 mm mass of the left posterior fossa is mostly or entirely extra-axial such that meningioma was to be considered in the differential diagnosis. However, four additional sub-centimeter faintly enhancing brain lesions are identified, and as such the constellation is most consistent with multiple cerebral metastases. Three of the smaller lesions are in the cerebellum, and one is in the left thalamus. 2. Persistent cerebellar edema. However, the 4th ventricle and basilar cisterns remain patent. 3. Study discussed by telephone with Dr. Vertell Limber on 05/26/2018 at 11:33 . Electronically Signed   By: Genevie Ann M.D.   On: 05/26/2018 11:36   Dg Hip Unilat W Or W/o Pelvis 1 View Left  Result Date: 06/18/2018 CLINICAL DATA:  History of colonic malignancy. Patient is complaining of constant low back pain radiating to the left hip for the past week. No known injury. EXAM: DG HIP (WITH OR WITHOUT PELVIS) 1V*L* COMPARISON:  Coronal and sagittal reconstructed images through the pelvis and left hip from a CT scan dated May 15, 2018 FINDINGS: The bones are subjectively adequately mineralized. There is no acute fracture nor lytic or blastic lesion. AP and lateral views of the left hip reveal mild symmetric narrowing of the joint space. The articular surfaces of the femoral head and acetabulum remains smoothly rounded. The femoral neck, intertrochanteric, and subtrochanteric regions are normal. IMPRESSION: There is no acute  bony abnormality of the pelvis or left hip. There is mild symmetric left hip joint space loss consistent with osteoarthritis. Electronically Signed   By: David  Martinique  M.D.   On: 06/18/2018 14:54   [name] Ekg Site Rite  Result Date: 05/23/2018 If Site Rite image not attached, placement could not be confirmed due to current cardiac rhythm.  US Abdomen Limited Ruq  Result Date: 05/21/2018 CLINICAL DATA:  Gallstone or polyp noted on recent CT. Metastatic colon cancer. EXAM: ULTRASOUND ABDOMEN LIMITED RIGHT UPPER QUADRANT COMPARISON:  CT from previous day FINDINGS: Gallbladder: No gallstones or wall thickening visualized. No polyp identified. No sonographic Murphy sign noted by sonographer. Common bile duct: Diameter: 5.1 mm, unremarkable Liver: 2 hypoechoic right lobe lesions 1 measuring 2.7 x 2.5 cm, the other 3.8 x 3.7 x 2.7 cm. Background parenchyma unremarkable. No biliary ductal dilatation. Portal vein is patent on color Doppler imaging with normal direction of blood flow towards the liver. IMPRESSION: 1. Normal gallbladder. 2. Liver metastases as previously demonstrated on CT. Electronically Signed   By: Lucrezia Europe M.D.   On: 05/21/2018 17:31        This case was discussed with [name]. He expressed agreement with my management of this patient.

## 2018-06-19 ENCOUNTER — Ambulatory Visit
Admission: RE | Admit: 2018-06-19 | Discharge: 2018-06-19 | Disposition: A | Discharge status: Home / Self Care | Payer: 59 | Source: Ambulatory Visit | Attending: Radiation Oncology | Admitting: Radiation Oncology

## 2018-06-19 ENCOUNTER — Encounter: Payer: Self-pay | Admitting: Radiation Oncology

## 2018-06-19 ENCOUNTER — Other Ambulatory Visit: Payer: Self-pay

## 2018-06-19 ENCOUNTER — Encounter: Payer: Self-pay | Admitting: *Deleted

## 2018-06-19 VITALS — BP 123/63 | HR 98 | Temp 98.2°F | Resp 18 | Ht 74.0 in | Wt 190.2 lb

## 2018-06-19 DIAGNOSIS — C189 Malignant neoplasm of colon, unspecified: Secondary | ICD-10-CM

## 2018-06-19 DIAGNOSIS — I252 Old myocardial infarction: Secondary | ICD-10-CM | POA: Diagnosis not present

## 2018-06-19 DIAGNOSIS — S32030A Wedge compression fracture of third lumbar vertebra, initial encounter for closed fracture: Secondary | ICD-10-CM | POA: Diagnosis not present

## 2018-06-19 DIAGNOSIS — C182 Malignant neoplasm of ascending colon: Secondary | ICD-10-CM | POA: Diagnosis not present

## 2018-06-19 DIAGNOSIS — I2510 Atherosclerotic heart disease of native coronary artery without angina pectoris: Secondary | ICD-10-CM | POA: Diagnosis not present

## 2018-06-19 DIAGNOSIS — C184 Malignant neoplasm of transverse colon: Secondary | ICD-10-CM | POA: Insufficient documentation

## 2018-06-19 DIAGNOSIS — Z794 Long term (current) use of insulin: Secondary | ICD-10-CM | POA: Diagnosis not present

## 2018-06-19 DIAGNOSIS — C787 Secondary malignant neoplasm of liver and intrahepatic bile duct: Secondary | ICD-10-CM | POA: Insufficient documentation

## 2018-06-19 DIAGNOSIS — M5416 Radiculopathy, lumbar region: Secondary | ICD-10-CM | POA: Diagnosis not present

## 2018-06-19 DIAGNOSIS — M25552 Pain in left hip: Secondary | ICD-10-CM

## 2018-06-19 DIAGNOSIS — C7949 Secondary malignant neoplasm of other parts of nervous system: Secondary | ICD-10-CM | POA: Diagnosis not present

## 2018-06-19 DIAGNOSIS — E785 Hyperlipidemia, unspecified: Secondary | ICD-10-CM | POA: Diagnosis not present

## 2018-06-19 DIAGNOSIS — Z85038 Personal history of other malignant neoplasm of large intestine: Secondary | ICD-10-CM | POA: Diagnosis not present

## 2018-06-19 DIAGNOSIS — E1142 Type 2 diabetes mellitus with diabetic polyneuropathy: Secondary | ICD-10-CM | POA: Diagnosis not present

## 2018-06-19 DIAGNOSIS — G893 Neoplasm related pain (acute) (chronic): Secondary | ICD-10-CM | POA: Diagnosis not present

## 2018-06-19 DIAGNOSIS — C7951 Secondary malignant neoplasm of bone: Secondary | ICD-10-CM | POA: Insufficient documentation

## 2018-06-19 DIAGNOSIS — Z79899 Other long term (current) drug therapy: Secondary | ICD-10-CM | POA: Insufficient documentation

## 2018-06-19 DIAGNOSIS — I10 Essential (primary) hypertension: Secondary | ICD-10-CM | POA: Diagnosis not present

## 2018-06-19 MED ORDER — OXYCODONE HCL 5 MG PO TABS: ORAL_TABLET | ORAL | 0 refills | Status: DC

## 2018-06-19 MED ORDER — DEXAMETHASONE 4 MG PO TABS: 4.0000 mg | ORAL_TABLET | Freq: Every day | ORAL | 0 refills | Status: DC

## 2018-06-19 MED ORDER — MORPHINE SULFATE ER 15 MG PO TBCR: 15.0000 mg | EXTENDED_RELEASE_TABLET | Freq: Two times a day (BID) | ORAL | 0 refills | Status: DC

## 2018-06-19 MED FILL — MORPHINE SULF ER 15 MG TAB: 15 | 15 days supply | Qty: 30 | Fill #0

## 2018-06-19 MED FILL — oxyCODONE HCL 5 MG TABS: 5 | 5 days supply | Qty: 60 | Fill #0

## 2018-06-19 NOTE — Progress Notes (Signed)
[name] office [phone] appointment this afternoon for his back brace fitting spoke with Allen Glenn who will let Allen Glenn know Allen Glenn is still in his re-consult at the Cancer center with Allen Glenn and Allen Simpson, PA.  The latest he can arrive is 1530. 1412 Allen Glenn and Allen Glenn informed about the above situation.  Anxious about getting to his appointment today.   Assured them if they are at Allen Glenn office by 1530 he will be seen today.

## 2018-06-19 NOTE — Progress Notes (Addendum)
Radiation Oncology         ([phone]) [phone] ________________________________  Name: Allen Glenn MRN: [id number]  Date: 06/19/2018  DOB: 1956-12-07  ER:[id number], Annie Main, MD  Ladell Pier, MD     REFERRING PHYSICIAN: Ladell Pier, MD   DIAGNOSIS: The primary encounter diagnosis was Bone metastases [facility]). Diagnoses of Hip pain, acute, left, Metastatic colon cancer to liver [facility]), and Malignant neoplasm of colon, unspecified part of colon [facility]) were also pertinent to this visit. 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 male with a history of metastatic colon cancer. He was diagnosed with stage IV disease in 2016 during his surgery when he was found to have liver involvement. He has received FOLFOX chemotherapy between the summer of 2016 and the spring of 2017. In November 2017 he underwent surveillance imaging which revealed a new 0.9 cm nodule in the left upper lobe. A stable 2.2 cm density was seen in the right hepatic lobe. A PET scan 11/19/2016 confirmed a hypermetabolic infrahilar lingular nodule, left peritracheal node, and a hypermetabolic pre-cardiac lymph node, new from 05/07/2016. Hypermetabolism was also associated with a hypoattenuating lesion in the dome of the liver. He was referred to Dr. Servando Snare and underwent EBUS with biopsy on 11/28/2016. A left lingular nodule was biopsied as was a level 4L node. The biopsies from the left lingula revealed no evidence of malignancy. Biopsy of the level 4L node revealed no malignant cells. A left lung bronchial washing was negative.  He transferred care to [name] in December 2017, and recommended palliative chemotherapy versus continued observation and he elected for surveillance. A CT on 02/11/17 revealed an increase in size of the pulmonary nodule from 1.0 cm to 1.2 cm, as well as an increase in size of the epicardial lymph node from 1.7 x 0.9  cm to 2.6 x 1.0 cm. He received SBRT to both sites in March 2018. He was found to have metastatic disease in the brain a few weeks ago and received preoperative SRS and resection of the left cerebellar lesion. He has recovered well but after surgery noted low back and left pelvic and testicular pain. He was seen by Dr. Vertell Limber for a postop check and this prompted an MRI of the lumbar spine on 06/18/18 revealing metastatic disease in T12 and all of the lumbar vertebral bodies with extensive infiltration of L3 extending into the left pedicle and into the facets and base of the transverse process there is pathologic fracture of the left side of the L3 vertebral body tumor protruding into the spinal canal central and to the left into the left neural foramen with mass-effect upon the thecal sac and adjacent nerves.  No other areas of epidural extension is noted.  There is also a small metastatic change on the tip of the spinous process at L2, lesions in bilateral iliac bones with a destruction of the left ilium extending into the left iliacus and tiny enhancing metastases in the S1 segment of the sacrum in the posterior aspect of S2.  Given these findings he comes today to discuss options of palliative radiotherapy.  He is scheduled to go to Dr. Melven Sartorius office to meet with 1 of the orthotics staff regarding a back brace for stability given the fracturing of L3.    PREVIOUS RADIATION THERAPY:  06/02/2018 Preop SRS: PTV1 Lt cerebellum 69mm      14 Gy PTV2 Rt  cereb 9mm ant           20 Gy PTV3 Rt cerebellum post 11mm 20Gy PTV4 Lt cereb vermis 48mm      20 Gy PTV5 Lt thalamus 9mm            20Gy   03/04/17 - 03/14/17 SBRT:  1. Left Lung: 60 Gy in 5 fractions 2. Left Chest: 50 Gy in 5 fractions  Past Medical History:  Past Medical History:  Diagnosis Date  . Acute MI ([place]) 2014   acute ST elevation MI  . Anemia   . Colon cancer ([place]) 2016   Status post resection of colon mass is well as he panic metastasis.     . Coronary artery disease   . Diabetes mellitus without complication ([place])   . Enlarged prostate    slightly and takes Flomax daily  . Heart disease   . History of blood transfusion   . Hyperlipidemia    takes Lipitor daily  . Hypertension    takes Lisinopril daily  . Lung nodule    left  . Nocturia   . Numbness    left foot  . Pancreatitis   . Peripheral neuropathy     Past Surgical History: Past Surgical History:  Procedure Laterality Date  . 1/8 of liver removed    . APPENDECTOMY    . APPLICATION OF CRANIAL NAVIGATION Left 06/04/2018   Procedure: APPLICATION OF CRANIAL NAVIGATION;  Surgeon: Erline Levine, MD;  Location: [place];  Service: Neurosurgery;  Laterality: Left;  . BIOPSY  05/22/2018   Procedure: BIOPSY;  Surgeon: Carol Ada, MD;  Location: [facility];  Service: Endoscopy;;  . BRAIN SURGERY  06/04/2018  . COLONOSCOPY    . CORONARY ANGIOPLASTY     1 stent  . CORONARY STENT PLACEMENT  06-27-2013  . CRANIOTOMY Left 06/04/2018   Procedure: CRANIOTOMY TUMOR EXCISION with Brainlab;  Surgeon: Erline Levine, MD;  Location: [place];  Service: Neurosurgery;  Laterality: Left;  CRANIOTOMY TUMOR EXCISION with Brainlab  . ESOPHAGOGASTRODUODENOSCOPY Left 05/22/2018   Procedure: ESOPHAGOGASTRODUODENOSCOPY (EGD);  Surgeon: Carol Ada, MD;  Location: [facility];  Service: Endoscopy;  Laterality: Left;  . HERNIA REPAIR Left 1991  . LAPAROSCOPIC RIGHT COLECTOMY  2016   [place]  . LIVER LOBECTOMY Right 09/28/2015    Right partial hepate[facility] at [facility]  . LUNG BIOPSY Left 11/28/2016   Procedure: LUNG BIOPSY, left upper lobe;  Surgeon: [name], MD;  Location: [place];  Service: Thoracic;  Laterality: Left;  Marland Kitchen VIDEO BRONCHOSCOPY WITH ENDOBRONCHIAL NAVIGATION N/A 11/28/2016   Procedure: VIDEO BRONCHOSCOPY WITH ENDOBRONCHIAL NAVIGATION;  Surgeon: [name], MD;  Location: [place];  Service: Thoracic;  Laterality: N/A;  . VIDEO BRONCHOSCOPY WITH ENDOBRONCHIAL ULTRASOUND  N/A 11/28/2016   Procedure: VIDEO BRONCHOSCOPY WITH ENDOBRONCHIAL ULTRASOUND;  Surgeon: [name], MD;  Location: [place];  Service: Thoracic;  Laterality: N/A;    Social History:  Social History   Socioeconomic History  . Marital status: Married    Spouse name: [name]  . Number of children: 2  . Years of education: Bachelor  . Highest education level: Not on file  Occupational History  . Occupation: Company secretary  Social Needs  . Financial resource strain: Not on file  . Food insecurity:    Worry: Not on file    Inability: Not on file  . Transportation needs:    Medical: Not on file    Non-medical: Not on file  Tobacco Use  .  Smoking status: Never Smoker  . Smokeless tobacco: Never Used  Substance and Sexual Activity  . Alcohol use: No    Alcohol/week: 0.0 oz  . Drug use: No  . Sexual activity: Not on file  Lifestyle  . Physical activity:    Days per week: Not on file    Minutes per session: Not on file  . Stress: Not on file  Relationships  . Social connections:    Talks on phone: Not on file    Gets together: Not on file    Attends religious service: Not on file    Active member of club or organization: Not on file    Attends meetings of clubs or organizations: Not on file    Relationship status: Not on file  . Intimate partner violence:    Fear of current or ex partner: Not on file    Emotionally abused: Not on file    Physically abused: Not on file    Forced sexual activity: Not on file  Other Topics Concern  . Not on file  Social History Narrative   Live at home with wife, [name]   Patient is a [employer] manager, active and goes to gym   Caffeine: very little soda, occasional tea. 12oz-16oz/per    Family History: Family History  Problem Relation Age of Onset  . Hypertension Mother   . Heart disease Mother   . Hypertension Father   . Heart disease Father   . Neuropathy Neg Hx     ALLERGIES: Patient has no known allergies.   MEDICATIONS:  Current  Outpatient Medications  Medication Sig Dispense Refill  . acetaminophen (TYLENOL) 500 MG tablet Take 1,000 mg by mouth every 6 (six) hours as needed (FOR PAIN/HEADACHES.).    Marland Kitchen atorvastatin (LIPITOR) 10 MG tablet Take 10 mg by mouth daily.     . clotrimazole (MYCELEX) 10 MG troche Take 1 tablet (10 mg total) by mouth 4 (four) times daily. Take while on decadron 117 tablet 0  . Coenzyme Q10 (COQ10) 200 MG CAPS Take 200 mg by mouth daily.    . Continuous Blood Gluc Sensor (FREESTYLE LIBRE 14 DAY SENSOR) MISC 1 application by Does not apply route every 14 (fourteen) days. 2 each 6  . [START ON 06/21/2018] dexamethasone (DECADRON) 4 MG tablet Take 1 tablet (4 mg total) by mouth daily. 30 tablet 0  . gabapentin (NEURONTIN) 300 MG capsule Take 1 capsule (300 mg total) by mouth 2 (two) times daily. 1 to 2 PO QHS 60 capsule 1  . HUMALOG KWIKPEN 100 UNIT/ML KiwkPen Inject 2-15 Units into the skin See admin instructions. 121-150=2 UNITS, 151-200=3 UNITS, 201-250=5 UNITS, 251-300=8 UNITS, 301-350=11 UNITS, 351-400=15 UNITS  2  . HYDROcodone-acetaminophen (NORCO/VICODIN) 5-325 MG tablet Take 1 tablet by mouth every 4 (four) hours as needed for moderate pain. 30 tablet 0  . Insulin Glargine (BASAGLAR KWIKPEN) 100 UNIT/ML SOPN Inject 0.12 mLs (12 Units total) into the skin at bedtime. 1 pen 1  . Insulin Pen Needle 31G X 5 MM MISC Use with insulin pen. 100 each 0  . lisinopril (PRINIVIL,ZESTRIL) 2.5 MG tablet Take 2.5 mg by mouth daily.    . methocarbamol (ROBAXIN) 750 MG tablet Take 1 tablet (750 mg total) by mouth every 8 (eight) hours as needed for muscle spasms. (Patient taking differently: Take 750 mg by mouth See admin instructions. Take 1 tablet by mouth every 8 hours as needed for spasms (SCHEDULED AT BEDTIME)) 30 tablet 0  . oxyCODONE (OXY IR/ROXICODONE)  5 MG immediate release tablet 1 to 2 PO Q 4 hours prn pain 60 tablet 0  . tamsulosin (FLOMAX) 0.4 MG CAPS capsule Take 0.4 mg by mouth daily after  breakfast. 30 minutes after breakfast    . trolamine salicylate (ASPERCREME) 10 % cream Apply 1 application topically 2 (two) times daily as needed (FOR BACK PAIN.).    Marland Kitchen morphine (MS CONTIN) 15 MG 12 hr tablet Take 1 tablet (15 mg total) by mouth every 12 (twelve) hours. 30 tablet 0  . nitroGLYCERIN (NITROSTAT) 0.4 MG SL tablet Place 0.4 mg under the tongue every 5 (five) minutes as needed for chest pain. CALL 911 IF PAIN PERSISTS    . ondansetron (ZOFRAN-ODT) 4 MG disintegrating tablet Take 1 tablet (4 mg total) by mouth every 8 (eight) hours as needed for nausea or vomiting. (Patient not taking: Reported on 06/19/2018) 45 tablet 0  . zolpidem (AMBIEN) 5 MG tablet Take 1 tablet (5 mg total) by mouth at bedtime as needed for sleep. (Patient not taking: Reported on 06/19/2018) 30 tablet 0   No current facility-administered medications for this encounter.      REVIEW OF SYSTEMS:  On review of systems, the patient reports that he i is in quite a deal of pain, he describes low back pain that radiates into his left pelvis and down his left anterior thigh.  He does find some relief with oxycodone, but is taking 5 mg, 2 tablets at a time every 4 hours with only mild relief.  His wife requests consideration of long-acting narcotic therapy.  He denies any chest pain, shortness of breath, cough, fevers, chills, night sweats, unintended weight changes. He denies any bowel or bladder disturbances, and denies abdominal pain, nausea or vomiting. He denies any additional musculoskeletal or joint aches or pains, new skin lesions or concerns. A complete review of systems is obtained and is otherwise negative.   PHYSICAL EXAM:  Wt Readings from Last 3 Encounters:  06/19/18 190 lb 3.2 oz (86.3 kg)  06/18/18 185 lb 12.8 oz (84.3 kg)  06/11/18 177 lb 12.8 oz (80.6 kg)   Temp Readings from Last 3 Encounters:  06/19/18 98.2 F (36.8 C)  06/18/18 97.8 F (36.6 C) (Oral)  06/11/18 97.8 F (36.6 C) (Oral)   BP  Readings from Last 3 Encounters:  06/19/18 123/63  06/18/18 122/63  06/11/18 129/75   Pulse Readings from Last 3 Encounters:  06/19/18 98  06/18/18 84  06/11/18 67   In general this is a well appearing caucasian male in no acute distress. He is alert and oriented x4 and appropriate throughout the examination. HEENT reveals that the patient is normocephalic, atraumatic. EOMs are intact.  Skin is intact without any evidence of gross lesions.Cardiopulmonary assessment is negative for acute distress and he exhibits normal effort.  He is seated comfortably in a wheelchair but from time to time shift his weight to his right hip when sitting due to discomfort.   ECOG = 0  0 - Asymptomatic (Fully active, able to carry on all predisease activities without restriction)  1 - Symptomatic but completely ambulatory (Restricted in physically strenuous activity but ambulatory and able to carry out work of a light or sedentary nature. For example, light housework, office work)  2 - Symptomatic, <50% in bed during the day (Ambulatory and capable of all self care but unable to carry out any work activities. Up and about more than 50% of waking hours)  3 - Symptomatic, >50% in bed,  but not bedbound (Capable of only limited self-care, confined to bed or chair 50% or more of waking hours)  4 - Bedbound (Completely disabled. Cannot carry on any self-care. Totally confined to bed or chair)  5 - Death   Eustace Pen MM, Creech RH, Tormey DC, et al. [phone]). "Toxicity and response criteria of the [facility]". [place] Oncol. 5 (6): 649-55   LABORATORY DATA:  Lab Results  Component Value Date   WBC 21.0 (H) 06/05/2018   HGB 13.7 06/05/2018   HCT 42.8 06/05/2018   MCV 88.8 06/05/2018   PLT 183 06/05/2018   Lab Results  Component Value Date   NA 136 06/09/2018   K 4.3 06/09/2018   CL 100 06/09/2018   CO2 24 06/09/2018   Lab Results  Component Value Date   ALT 18 05/25/2018    AST 31 05/25/2018   ALKPHOS 251 (H) 05/25/2018   BILITOT 0.5 05/25/2018      RADIOGRAPHY: Ct Head W & Wo Contrast  Result Date: 05/23/2018 CLINICAL DATA:  Metastatic colon cancer with intractable nausea. Weakness and dizziness for 2 weeks. EXAM: CT HEAD WITHOUT AND WITH CONTRAST TECHNIQUE: Contiguous axial images were obtained from the base of the skull through the vertex without and with intravenous contrast CONTRAST:  57mL OMNIPAQUE IOHEXOL 300 MG/ML  SOLN COMPARISON:  PET-CT 11/19/2016 FINDINGS: Brain: Enhancing mass in the lateral left cerebellum measuring 3.4 cm, with extensive vasogenic edema crossing midline and narrowing the fourth ventricle. No hydrocephalus. No evidence of infarct or hemorrhage. Chronic small vessel ischemic type change in the cerebral white matter. Vascular: Visible vessels are patent. Skull: No acute finding or evidence of metastasis. Sinuses/Orbits: Negative These results were called by telephone at the time of interpretation on 05/23/2018 at 10:23 am to Dr. Betsy Coder , who verbally acknowledged these results. 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 There is fourth ventricular narrowing without hydrocephalus. Electronically Signed   By: Monte Fantasia M.D.   On: 05/23/2018 10:23   Ct Abdomen W Contrast  Result Date: 05/20/2018 CLINICAL DATA:  New onset nausea and vomiting. EXAM: CT ABDOMEN WITH CONTRAST TECHNIQUE: Multidetector CT imaging of the abdomen was performed using the standard protocol following bolus administration of intravenous contrast. CONTRAST:  114mL ISOVUE-300 IOPAMIDOL (ISOVUE-300) INJECTION 61% COMPARISON:  CT scan 05/15/2018 FINDINGS: Lower chest: Stable pulmonary nodules since the recent CT scan. No acute findings at the lung bases. No pleural effusions. Hepatobiliary: Stable hepatic metastatic disease. No acute hepatic findings. Possible small gallstone dependently in the gallbladder. No CT findings to suggest  acute cholecystitis. No common bile duct dilatation. Pancreas: No mass, inflammation or ductal dilatation. Spleen: Normal size.  No focal lesions. Adrenals/Urinary Tract: The adrenal glands and kidneys are unremarkable and stable. Stomach/Bowel: The stomach, duodenum, visualized small bowel and visualize colon are grossly normal. Stable appearance of the colonic anastomosis. Stable omental implant on image number 28. Vascular/Lymphatic: The aorta and branch vessels are stable. Scattered atherosclerotic calcifications. The major venous structures are patent. Small scattered mesenteric and retroperitoneal lymph nodes. Other: No ascites or abdominal wall hernia. Musculoskeletal: No significant bony findings. IMPRESSION: Stable CT appearance of the abdomen when compared to recent prior study. No new/acute findings. Suspect small gallstone or possible polyp in the gallbladder. No CT findings for acute cholecystitis. No biliary dilatation. Electronically Signed   By: Marijo Sanes M.D.   On: 05/20/2018 16:19   Mr Jeri Cos BP Contrast  Result Date: 05/26/2018 CLINICAL  DATA:  62 year old male with metastatic colon cancer. Left cerebellar mass with edema detected on head CT 05/23/2018. Started on IV Decadron. Treatment planning. EXAM: MRI HEAD WITHOUT AND WITH CONTRAST TECHNIQUE: Multiplanar, multiecho pulse sequences of the brain and surrounding structures were obtained without and with intravenous contrast. CONTRAST:  65mL MULTIHANCE GADOBENATE DIMEGLUMINE 529 MG/ML IV SOLN COMPARISON:  Head CT without contrast 05/23/2018. FINDINGS: Brain: Lobulated and heterogeneously enhancing rounded mass in the left lateral posterior fossa measures up to 35 millimeters maximum diameter, approximately 31 millimeters AP. On both series 3, image 29 and coronal series 11, images 15 and 16 the mass appears to be extra-axial, and there is associated dural thickening, a possible dural tail on coronal series 13, image 17. However, the mass  lacks the typical diffusion imaging for meningioma. There is patchy decreased T2* signal within the lower aspect of the lesion which might be blood products or calcification. There persists moderate to severe edema throughout the left cerebellum, and edema continues to cross midline at the level of the deep cerebellar nuclei. There is mild mass effect on the 4th ventricle which remains patent. The other basilar cisterns remain patent. However, there are 4 additional sub centimeter - 3-4 mm each - round somewhat faintly enhancing lesions identified: - right cerebellum near midline series 12 images 28 and 30. - left superior cerebellar vermis image 60. - central left thalamus image 95. Some of the right hemisphere cerebellar edema might be related to the small right cerebellar lesions, but the thalamic lesion shows no edema or mass effect. No other dural thickening. There is a chronic nonenhancing microhemorrhage in the posterior right temporal lobe on series 8, image 12. No ventriculomegaly. No restricted diffusion or evidence of acute infarction. No acute intracranial hemorrhage. Negative pituitary and cervicomedullary junction. Patchy nonspecific bilateral cerebral white matter T2 and FLAIR hyperintensity. Vascular: Major intracranial vascular flow voids are preserved, the distal right vertebral artery appears dominant. The major dural venous sinuses are enhancing and appear patent, including the left sigmoid. Skull and upper cervical spine: Negative visible cervical spine and spinal cord. Visualized bone marrow signal is within normal limits. Sinuses/Orbits: Negative. Other: Mastoid air cells remain clear. Visible internal auditory structures appear normal. Scalp and face soft tissues appear negative. IMPRESSION: 1. There is strong evidence that the lobulated and enhancing 35 mm mass of the left posterior fossa is mostly or entirely extra-axial such that meningioma was to be considered in the differential  diagnosis. However, four additional sub-centimeter faintly enhancing brain lesions are identified, and as such the constellation is most consistent with multiple cerebral metastases. Three of the smaller lesions are in the cerebellum, and one is in the left thalamus. 2. Persistent cerebellar edema. However, the 4th ventricle and basilar cisterns remain patent. 3. Study discussed by telephone with Dr. Vertell Limber on 05/26/2018 at 11:33 . Electronically Signed   By: Genevie Ann M.D.   On: 05/26/2018 11:36   Dg Hip Unilat W Or W/o Pelvis 1 View Left  Result Date: 06/18/2018 CLINICAL DATA:  History of colonic malignancy. Patient is complaining of constant low back pain radiating to the left hip for the past week. No known injury. EXAM: DG HIP (WITH OR WITHOUT PELVIS) 1V*L* COMPARISON:  Coronal and sagittal reconstructed images through the pelvis and left hip from a CT scan dated May 15, 2018 FINDINGS: The bones are subjectively adequately mineralized. There is no acute fracture nor lytic or blastic lesion. AP and lateral views of the left hip reveal  mild symmetric narrowing of the joint space. The articular surfaces of the femoral head and acetabulum remains smoothly rounded. The femoral neck, intertrochanteric, and subtrochanteric regions are normal. IMPRESSION: There is no acute bony abnormality of the pelvis or left hip. There is mild symmetric left hip joint space loss consistent with osteoarthritis. Electronically Signed   By: David  Martinique M.D.   On: 06/18/2018 14:54   [name] Ekg Site Rite  Result Date: 05/23/2018 If Site Rite image not attached, placement could not be confirmed due to current cardiac rhythm.  US Abdomen Limited Ruq  Result Date: 05/21/2018 CLINICAL DATA:  Gallstone or polyp noted on recent CT. Metastatic colon cancer. EXAM: ULTRASOUND ABDOMEN LIMITED RIGHT UPPER QUADRANT COMPARISON:  CT from previous day FINDINGS: Gallbladder: No gallstones or wall thickening visualized. No polyp identified. No  sonographic Murphy sign noted by sonographer. Common bile duct: Diameter: 5.1 mm, unremarkable Liver: 2 hypoechoic right lobe lesions 1 measuring 2.7 x 2.5 cm, the other 3.8 x 3.7 x 2.7 cm. Background parenchyma unremarkable. No biliary ductal dilatation. Portal vein is patent on color Doppler imaging with normal direction of blood flow towards the liver. IMPRESSION: 1. Normal gallbladder. 2. Liver metastases as previously demonstrated on CT. Electronically Signed   By: Lucrezia Europe M.D.   On: 05/21/2018 17:31       IMPRESSION/PLAN: 1.  Stage IV pT3, pN2b, M1, moderately differentiated adenocarcinoma of the right colon.  Discussed the findings on the patient's recent imaging and discusses the rationale for palliative radiotherapy for the lumbar spine to include T12-S2.  He offers the patient to proceed with simulation tomorrow, the patient is unable to do this because of going on vacation but he will be back in town next Tuesday.  We discussed the importance of staying on steroid therapy given the concern for the proximity to the thecal sac, and weigh the risks and benefits associated with this.  As the patient is not having any neurologic symptoms, we feel that it would be an option to wait for treatment until he returns from his vacation however he understands to contact us with any changes neurologically that could occur.  He is tapering from prednisone, and we discussed changing to dexamethasone 4 mg daily however after discussing this with Dr. Vertell Limber, his recommendation for the patient is to use dexamethasone 4 mg twice daily.  In addition, we did discuss the options of further imaging the cervical and thoracic spine is significant changes were noted in the lumbar spine in the interval the thoracic spine was last imaged, and this was on a CT scan rather than MRI.  His most recent MRI of the brain earlier this month did not show concerns for disease in the upper portion of the cervical spine though again this  was incompletely imaged due to the nature of what we were looking at at that time so we will proceed with MRI of the thoracic and cervical spine.  The patient is in agreement with this plan.  We did review the risks, benefits and delivery and logistics of radiotherapy.  At the end of the conversation the patient is ready to move forward and we will have our simulation staff contact him to coordinate his simulation next Wednesday when he is back in town.  We will review consent at that point as well to have some time to talk about his pain relief.  2. GI Prophylaxis. I spoke with the patient's wife regarding OTC PPI therapy and he will begin  this given his steroid use.  Did also go ahead and give him another prescription for dexamethasone to make sure he has enough medication to get him through treatment with his twice daily dosing. 3. Pain secondary to #1. The patient was sent a prescription to the pharmacy for MSContin and Oxycodone. We will follow up with his progress next Wednesday and make adjustments by phone if needed.  In a visit lasting 60 minutes, greater than 50% of the time was spent face to face discussing his, and coordinating the patient's care.  The above documentation reflects my direct findings during this shared patient visit. Please see the separate note by Dr. Lisbeth Renshaw on this date for the remainder of the patient's plan of care.     Carola Rhine, PAC

## 2018-06-24 ENCOUNTER — Encounter: Payer: Self-pay | Admitting: Oncology

## 2018-06-25 ENCOUNTER — Ambulatory Visit
Admission: RE | Admit: 2018-06-25 | Discharge: 2018-06-25 | Disposition: A | Discharge status: Home / Self Care | Payer: 59 | Source: Ambulatory Visit | Attending: Radiation Oncology | Admitting: Radiation Oncology

## 2018-06-25 ENCOUNTER — Telehealth: Payer: Self-pay | Admitting: *Deleted

## 2018-06-25 DIAGNOSIS — C7800 Secondary malignant neoplasm of unspecified lung: Secondary | ICD-10-CM | POA: Insufficient documentation

## 2018-06-25 DIAGNOSIS — E1142 Type 2 diabetes mellitus with diabetic polyneuropathy: Secondary | ICD-10-CM | POA: Insufficient documentation

## 2018-06-25 DIAGNOSIS — M25552 Pain in left hip: Secondary | ICD-10-CM | POA: Diagnosis present

## 2018-06-25 DIAGNOSIS — Z79899 Other long term (current) drug therapy: Secondary | ICD-10-CM | POA: Diagnosis not present

## 2018-06-25 DIAGNOSIS — Z794 Long term (current) use of insulin: Secondary | ICD-10-CM | POA: Diagnosis not present

## 2018-06-25 DIAGNOSIS — I2510 Atherosclerotic heart disease of native coronary artery without angina pectoris: Secondary | ICD-10-CM | POA: Insufficient documentation

## 2018-06-25 DIAGNOSIS — C7951 Secondary malignant neoplasm of bone: Secondary | ICD-10-CM

## 2018-06-25 DIAGNOSIS — Z510 Encounter for antineoplastic radiation therapy: Secondary | ICD-10-CM | POA: Insufficient documentation

## 2018-06-25 DIAGNOSIS — I252 Old myocardial infarction: Secondary | ICD-10-CM | POA: Diagnosis not present

## 2018-06-25 DIAGNOSIS — C7931 Secondary malignant neoplasm of brain: Secondary | ICD-10-CM | POA: Insufficient documentation

## 2018-06-25 DIAGNOSIS — C787 Secondary malignant neoplasm of liver and intrahepatic bile duct: Secondary | ICD-10-CM | POA: Insufficient documentation

## 2018-06-25 DIAGNOSIS — C189 Malignant neoplasm of colon, unspecified: Secondary | ICD-10-CM | POA: Diagnosis not present

## 2018-06-25 DIAGNOSIS — I119 Hypertensive heart disease without heart failure: Secondary | ICD-10-CM | POA: Insufficient documentation

## 2018-06-25 DIAGNOSIS — E785 Hyperlipidemia, unspecified: Secondary | ICD-10-CM | POA: Insufficient documentation

## 2018-06-25 DIAGNOSIS — C7802 Secondary malignant neoplasm of left lung: Secondary | ICD-10-CM | POA: Diagnosis not present

## 2018-06-25 MED ORDER — GABAPENTIN 300 MG PO CAPS: 300.0000 mg | ORAL_CAPSULE | Freq: Three times a day (TID) | ORAL | 1 refills | Status: AC

## 2018-06-25 MED ORDER — MORPHINE SULFATE ER 30 MG PO TBCR: 30.0000 mg | EXTENDED_RELEASE_TABLET | Freq: Every day | ORAL | 0 refills | Status: DC

## 2018-06-25 MED ORDER — OXYCODONE HCL 5 MG PO TABS: ORAL_TABLET | ORAL | 0 refills | Status: DC

## 2018-06-25 MED FILL — MORPHINE SULF ER 30 MG TAB: 30 | 30 days supply | Qty: 30 | Fill #0

## 2018-06-25 MED FILL — oxyCODONE HCL 5 MG TABS: 5 | 5 days supply | Qty: 90 | Fill #0

## 2018-06-25 NOTE — Progress Notes (Signed)
I saw patient today in simulation. He started MS Contin 15 mg BID last week and has been taking 10 mg oxycodone in addition q 4 hours for breakthrough with mild relief. He's only had three times when he was able to just take one of the oxycodone tablets. We discussed that with radiotherapy his need for narcotic medication should improve after about a week and a half of treatment to where he could decrease the frequency and amount of medication he's currently receiving. For now we will up his MS Contin to 30 mg at night time, keep the 15 mg in the morning, and give him more flexibility of his Oxycodone for breakthru relief. So he can take 4-6 hours prn of the oxycodone. A new Rx was sent in for Ms Contin 30 mg, Oxycodone 5 mg, and Gabapentin 300 mg which Dr. Vertell Limber has increased to TID. He continues his Dexamethasone 4 mg BID, and is using sliding scale insulin to cover his blood sugar spikes.

## 2018-06-25 NOTE — Telephone Encounter (Signed)
Called patient to inform of MRI for 07-04-18- arrival time - 2:30 pm @ [facility], no restrictions to test, spoke with patient and he is aware of this test

## 2018-06-27 DIAGNOSIS — I252 Old myocardial infarction: Secondary | ICD-10-CM | POA: Diagnosis not present

## 2018-06-27 DIAGNOSIS — E785 Hyperlipidemia, unspecified: Secondary | ICD-10-CM | POA: Diagnosis not present

## 2018-06-27 DIAGNOSIS — E1142 Type 2 diabetes mellitus with diabetic polyneuropathy: Secondary | ICD-10-CM | POA: Diagnosis not present

## 2018-06-27 DIAGNOSIS — Z510 Encounter for antineoplastic radiation therapy: Secondary | ICD-10-CM | POA: Diagnosis not present

## 2018-06-27 DIAGNOSIS — C189 Malignant neoplasm of colon, unspecified: Secondary | ICD-10-CM | POA: Diagnosis not present

## 2018-06-27 DIAGNOSIS — C787 Secondary malignant neoplasm of liver and intrahepatic bile duct: Secondary | ICD-10-CM | POA: Diagnosis not present

## 2018-06-27 DIAGNOSIS — C7802 Secondary malignant neoplasm of left lung: Secondary | ICD-10-CM | POA: Diagnosis not present

## 2018-06-27 DIAGNOSIS — C7951 Secondary malignant neoplasm of bone: Secondary | ICD-10-CM | POA: Diagnosis not present

## 2018-06-27 DIAGNOSIS — C7931 Secondary malignant neoplasm of brain: Secondary | ICD-10-CM | POA: Diagnosis not present

## 2018-06-27 DIAGNOSIS — C7800 Secondary malignant neoplasm of unspecified lung: Secondary | ICD-10-CM | POA: Diagnosis not present

## 2018-06-27 DIAGNOSIS — I2510 Atherosclerotic heart disease of native coronary artery without angina pectoris: Secondary | ICD-10-CM | POA: Diagnosis not present

## 2018-06-27 MED FILL — ATORVASTATIN 10 MG TABLET: 10 | 90 days supply | Qty: 90 | Fill #1

## 2018-06-30 ENCOUNTER — Ambulatory Visit
Admission: RE | Admit: 2018-06-30 | Discharge: 2018-06-30 | Disposition: A | Discharge status: Home / Self Care | Payer: 59 | Source: Ambulatory Visit | Attending: Radiation Oncology | Admitting: Radiation Oncology

## 2018-06-30 ENCOUNTER — Telehealth: Payer: Self-pay | Admitting: Radiation Oncology

## 2018-06-30 DIAGNOSIS — E1142 Type 2 diabetes mellitus with diabetic polyneuropathy: Secondary | ICD-10-CM | POA: Diagnosis not present

## 2018-06-30 DIAGNOSIS — C7802 Secondary malignant neoplasm of left lung: Secondary | ICD-10-CM | POA: Diagnosis not present

## 2018-06-30 DIAGNOSIS — Z510 Encounter for antineoplastic radiation therapy: Secondary | ICD-10-CM | POA: Diagnosis not present

## 2018-06-30 DIAGNOSIS — E785 Hyperlipidemia, unspecified: Secondary | ICD-10-CM | POA: Diagnosis not present

## 2018-06-30 DIAGNOSIS — C189 Malignant neoplasm of colon, unspecified: Secondary | ICD-10-CM | POA: Diagnosis not present

## 2018-06-30 DIAGNOSIS — C7951 Secondary malignant neoplasm of bone: Secondary | ICD-10-CM | POA: Diagnosis not present

## 2018-06-30 DIAGNOSIS — C787 Secondary malignant neoplasm of liver and intrahepatic bile duct: Secondary | ICD-10-CM | POA: Diagnosis not present

## 2018-06-30 DIAGNOSIS — C7800 Secondary malignant neoplasm of unspecified lung: Secondary | ICD-10-CM | POA: Diagnosis not present

## 2018-06-30 DIAGNOSIS — I252 Old myocardial infarction: Secondary | ICD-10-CM | POA: Diagnosis not present

## 2018-06-30 DIAGNOSIS — I2510 Atherosclerotic heart disease of native coronary artery without angina pectoris: Secondary | ICD-10-CM | POA: Diagnosis not present

## 2018-06-30 DIAGNOSIS — C7931 Secondary malignant neoplasm of brain: Secondary | ICD-10-CM | POA: Diagnosis not present

## 2018-06-30 NOTE — Telephone Encounter (Signed)
I called the patient's wife to check on Allen Glenn. He is doing better but taking MSContin 30 mg BID, and oxycodone prn. He is scheduled to see me next Monday which we don't need to do. I will work with our navigator to coordinate his MRI brain in September. He will also be going to have MRI of C and T spine this week.

## 2018-07-01 ENCOUNTER — Ambulatory Visit
Admission: RE | Admit: 2018-07-01 | Discharge: 2018-07-01 | Disposition: A | Discharge status: Home / Self Care | Payer: 59 | Source: Ambulatory Visit | Attending: Radiation Oncology | Admitting: Radiation Oncology

## 2018-07-01 DIAGNOSIS — C7800 Secondary malignant neoplasm of unspecified lung: Secondary | ICD-10-CM | POA: Diagnosis not present

## 2018-07-01 DIAGNOSIS — I2510 Atherosclerotic heart disease of native coronary artery without angina pectoris: Secondary | ICD-10-CM | POA: Diagnosis not present

## 2018-07-01 DIAGNOSIS — C189 Malignant neoplasm of colon, unspecified: Secondary | ICD-10-CM | POA: Diagnosis not present

## 2018-07-01 DIAGNOSIS — C787 Secondary malignant neoplasm of liver and intrahepatic bile duct: Secondary | ICD-10-CM | POA: Diagnosis not present

## 2018-07-01 DIAGNOSIS — C7931 Secondary malignant neoplasm of brain: Secondary | ICD-10-CM | POA: Diagnosis not present

## 2018-07-01 DIAGNOSIS — I252 Old myocardial infarction: Secondary | ICD-10-CM | POA: Diagnosis not present

## 2018-07-01 DIAGNOSIS — E785 Hyperlipidemia, unspecified: Secondary | ICD-10-CM | POA: Diagnosis not present

## 2018-07-01 DIAGNOSIS — Z510 Encounter for antineoplastic radiation therapy: Secondary | ICD-10-CM | POA: Diagnosis not present

## 2018-07-01 DIAGNOSIS — E1142 Type 2 diabetes mellitus with diabetic polyneuropathy: Secondary | ICD-10-CM | POA: Diagnosis not present

## 2018-07-02 ENCOUNTER — Telehealth: Payer: Self-pay

## 2018-07-02 ENCOUNTER — Inpatient Hospital Stay: Payer: 59 | Attending: Oncology | Admitting: Oncology

## 2018-07-02 ENCOUNTER — Other Ambulatory Visit: Payer: Self-pay | Admitting: Student

## 2018-07-02 ENCOUNTER — Ambulatory Visit
Admission: RE | Admit: 2018-07-02 | Discharge: 2018-07-02 | Disposition: A | Discharge status: Home / Self Care | Payer: 59 | Source: Ambulatory Visit | Attending: Radiation Oncology | Admitting: Radiation Oncology

## 2018-07-02 ENCOUNTER — Telehealth: Payer: Self-pay | Admitting: Oncology

## 2018-07-02 ENCOUNTER — Inpatient Hospital Stay: Payer: 59

## 2018-07-02 ENCOUNTER — Encounter: Payer: Self-pay | Admitting: Oncology

## 2018-07-02 VITALS — BP 147/70 | HR 89 | Temp 98.8°F | Resp 18 | Ht 74.0 in | Wt 191.4 lb

## 2018-07-02 DIAGNOSIS — I10 Essential (primary) hypertension: Secondary | ICD-10-CM | POA: Insufficient documentation

## 2018-07-02 DIAGNOSIS — C7951 Secondary malignant neoplasm of bone: Secondary | ICD-10-CM | POA: Diagnosis not present

## 2018-07-02 DIAGNOSIS — C189 Malignant neoplasm of colon, unspecified: Secondary | ICD-10-CM | POA: Diagnosis not present

## 2018-07-02 DIAGNOSIS — I2510 Atherosclerotic heart disease of native coronary artery without angina pectoris: Secondary | ICD-10-CM | POA: Diagnosis not present

## 2018-07-02 DIAGNOSIS — R112 Nausea with vomiting, unspecified: Secondary | ICD-10-CM | POA: Insufficient documentation

## 2018-07-02 DIAGNOSIS — C7931 Secondary malignant neoplasm of brain: Secondary | ICD-10-CM | POA: Insufficient documentation

## 2018-07-02 DIAGNOSIS — G893 Neoplasm related pain (acute) (chronic): Secondary | ICD-10-CM | POA: Diagnosis not present

## 2018-07-02 DIAGNOSIS — E119 Type 2 diabetes mellitus without complications: Secondary | ICD-10-CM | POA: Diagnosis not present

## 2018-07-02 DIAGNOSIS — M84550A Pathological fracture in neoplastic disease, pelvis, initial encounter for fracture: Secondary | ICD-10-CM | POA: Insufficient documentation

## 2018-07-02 DIAGNOSIS — R197 Diarrhea, unspecified: Secondary | ICD-10-CM | POA: Diagnosis not present

## 2018-07-02 DIAGNOSIS — R918 Other nonspecific abnormal finding of lung field: Secondary | ICD-10-CM | POA: Insufficient documentation

## 2018-07-02 DIAGNOSIS — C787 Secondary malignant neoplasm of liver and intrahepatic bile duct: Secondary | ICD-10-CM | POA: Diagnosis not present

## 2018-07-02 DIAGNOSIS — B370 Candidal stomatitis: Secondary | ICD-10-CM | POA: Insufficient documentation

## 2018-07-02 DIAGNOSIS — C182 Malignant neoplasm of ascending colon: Secondary | ICD-10-CM | POA: Diagnosis not present

## 2018-07-02 DIAGNOSIS — R630 Anorexia: Secondary | ICD-10-CM | POA: Insufficient documentation

## 2018-07-02 DIAGNOSIS — C7800 Secondary malignant neoplasm of unspecified lung: Secondary | ICD-10-CM | POA: Diagnosis not present

## 2018-07-02 DIAGNOSIS — I252 Old myocardial infarction: Secondary | ICD-10-CM | POA: Diagnosis not present

## 2018-07-02 DIAGNOSIS — E1142 Type 2 diabetes mellitus with diabetic polyneuropathy: Secondary | ICD-10-CM | POA: Diagnosis not present

## 2018-07-02 DIAGNOSIS — Z510 Encounter for antineoplastic radiation therapy: Secondary | ICD-10-CM | POA: Diagnosis not present

## 2018-07-02 DIAGNOSIS — E785 Hyperlipidemia, unspecified: Secondary | ICD-10-CM | POA: Diagnosis not present

## 2018-07-02 LAB — CMP (CANCER CENTER ONLY)
ALBUMIN: 3.2 g/dL — AB (ref 3.5–5.0)
ALT: 43 U/L (ref 0–44)
AST: 22 U/L (ref 15–41)
Alkaline Phosphatase: 384 U/L — ABNORMAL HIGH (ref 38–126)
Anion gap: 11 (ref 5–15)
BILIRUBIN TOTAL: 0.3 mg/dL (ref 0.3–1.2)
BUN: 30 mg/dL — AB (ref 8–23)
CHLORIDE: 97 mmol/L — AB (ref 98–111)
CO2: 24 mmol/L (ref 22–32)
Calcium: 8.3 mg/dL — ABNORMAL LOW (ref 8.9–10.3)
Creatinine: 0.73 mg/dL (ref 0.61–1.24)
GFR, Est AFR Am: >60 mL/min (ref >60)
GFR, Estimated: >60 mL/min (ref >60)
GLUCOSE: 308 mg/dL — AB (ref 70–99)
POTASSIUM: 4.6 mmol/L (ref 3.5–5.1)
Sodium: 132 mmol/L — ABNORMAL LOW (ref 135–145)
TOTAL PROTEIN: 6 g/dL — AB (ref 6.5–8.1)

## 2018-07-02 LAB — CEA (IN HOUSE-CHCC): CEA (CHCC-In House): 31.01 ng/mL — ABNORMAL HIGH (ref 0.00–5.00)

## 2018-07-02 LAB — CBC WITH DIFFERENTIAL (CANCER CENTER ONLY)
Basophils Absolute: 0 10*3/uL (ref 0.0–0.1)
Basophils Relative: 0 %
Eosinophils Absolute: 0 10*3/uL (ref 0.0–0.5)
Eosinophils Relative: 0 %
HEMATOCRIT: 40.3 % (ref 38.4–49.9)
Hemoglobin: 13.1 g/dL (ref 13.0–17.1)
LYMPHS ABS: 0.5 10*3/uL — AB (ref 0.9–3.3)
Lymphocytes Relative: 4 %
MCH: 29.1 pg (ref 27.2–33.4)
MCHC: 32.5 g/dL (ref 32.0–36.0)
MCV: 89.6 fL (ref 79.3–98.0)
MONO ABS: 0.8 10*3/uL (ref 0.1–0.9)
MONOS PCT: 6 %
NEUTROS ABS: 10.4 10*3/uL — AB (ref 1.5–6.5)
Neutrophils Relative %: 90 %
Platelet Count: 213 10*3/uL (ref 140–400)
RBC: 4.5 MIL/uL (ref 4.20–5.82)
RDW: 15.2 % — AB (ref 11.0–14.6)
WBC Count: 11.7 10*3/uL — ABNORMAL HIGH (ref 4.0–10.3)

## 2018-07-02 MED ORDER — MORPHINE SULFATE 15 MG PO TABS: 15.0000 mg | ORAL_TABLET | ORAL | 0 refills | Status: DC | PRN

## 2018-07-02 MED ORDER — MORPHINE SULFATE ER 30 MG PO TBCR: 30.0000 mg | EXTENDED_RELEASE_TABLET | Freq: Three times a day (TID) | ORAL | 0 refills | Status: DC

## 2018-07-02 MED FILL — MORPHINE SULFATE IR 15 MG T: 15 | 15 days supply | Qty: 90 | Fill #0

## 2018-07-02 NOTE — Telephone Encounter (Signed)
Moved infusion time from 7:30 to 7:45. Per 7/9 correction on new template

## 2018-07-02 NOTE — Progress Notes (Signed)
Allen Glenn OFFICE PROGRESS NOTE   Diagnosis: Colon cancer  INTERVAL HISTORY:   Allen Glenn returns for a scheduled visit.  When he was here last month he had increased pain in the left leg.  Dr. Vertell Limber obtained an MRI of the lumbar spine on 06/18/2018.  This confirmed extensive metastatic disease at T12 and the lumbar vertebral bodies.  There was extensive infiltration of L3 with a pathologic fracture of the left side of L3.  Metastatic disease was also noted at the left iliac and sacrum. He began palliative radiation to the T12-S2 levels 06/30/2018. Allen Glenn continues to have pain in the back and left leg.  The pain is relieved with 15 mg of oxycodone every 4 hours in addition to 30 mg of MS Contin twice daily.  He was able to take a vacation to the beach.  No nausea or balance difficulty.  He is here today with multiple family members.   Objective:  Vital signs in last 24 hours:  Blood pressure (!) 147/70, pulse 89, temperature 98.8 F (37.1 C), temperature source Oral, resp. rate 18, height _0  (1.88 m), weight 191 lb 6.4 oz (86.8 kg), SpO2 98 %.    HEENT: No thrush Resp: Lungs clear bilaterally Cardio: Regular rate and rhythm GI: No hepatomegaly, nontender Vascular: No leg edema Neuro: Leg and foot strength is intact bilaterally   Lab Results:  Lab Results  Component Value Date   WBC 11.7 (H) 07/02/2018   HGB 13.1 07/02/2018   HCT 40.3 07/02/2018   MCV 89.6 07/02/2018   PLT 213 07/02/2018   NEUTROABS 10.4 (H) 07/02/2018    CMP  Lab Results  Component Value Date   NA 132 (L) 07/02/2018   K 4.6 07/02/2018   CL 97 (L) 07/02/2018   CO2 24 07/02/2018   GLUCOSE 308 (H) 07/02/2018   BUN 30 (H) 07/02/2018   CREATININE 0.73 07/02/2018   CALCIUM 8.3 (L) 07/02/2018   PROT 6.0 (L) 07/02/2018   ALBUMIN 3.2 (L) 07/02/2018   AST 22 07/02/2018   ALT 43 07/02/2018   ALKPHOS 384 (H) 07/02/2018   BILITOT 0.3 07/02/2018   GFRNONAA >60 07/02/2018   GFRAA >60  07/02/2018    Lab Results  Component Value Date   CEA1 20.94 (H) 06/09/2018     Medications: I have reviewed the patient's current medications.   Assessment/Plan: 1. 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 No BRAFmutation  Liver biopsy 05/04/2015-metastatic adenocarcinoma consistent with a colon primary ? Staging PET scan 06/08/2015-isolated segment 4A liver lesion ? Initiation of adjuvant FOLFOX 06/13/2015 ? Restaging CT 08/09/2015 revealed a slight decrease in a borderline ileocolic node, decrease in the hepatic dome metastasis, no new lesions ? Liver resection 09/28/2015-pathology consistent with metastatic colon cancer, negative margins ? Adjuvant FOLFOX resumed 11/08/2015, oxaliplatin eliminated beginning 11/22/2015 secondary to neuropathy. He completed adjuvant chemotherapy 02/16/2016 ? Restaging chest CT 11/08/2016, compared to 05/07/2016 revealed a new 9 mm left upper lobe nodule, stable 2.2 cm right hepatic lesion ? PET scan 11/19/2016 confirmed a hypermetabolic left upper lobe nodule, hypermetabolic left paratracheal and pericardiac lymph nodes, and hypermetabolism associated with the hypoattenuating lesion in the dome of the liver ? Status post EBUSbiopsies of the left lingula nodule and a level 4Lnode on 11/28/2016-no evidence of malignancy ? CT chest 02/11/2017-increase in size of the left pulmonary nodule and epicardial lymph node ? Status post  SBRT 2 the left lung nodule and mediastinum completed 03/14/2017 ? CTs 07/01/2017-new 9 mm focus along the right liver capsule, stable left subcapsular liver lesion, radiation change at site of left upper lobe nodule, stable pericardiallymph node ? CTs 11/25/2017-new 5 mm lingular nodule, enlargement of capsular-based right liver lesion, new capsular based right liver lesion capsular  lesion at the hepatic dome ? CTs 02/25/2018- enlargement of small lung nodules, liver lesions and small mesenteric lymph node ? CT abdomen/pelvis 05/15/2018- new lower lobe pulmonary nodules, enlargement of liver lesions and right lower quadrant soft tissue nodules ? 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 Brain MRI 05/26/2018- 3.5 similar left posterior fossa mass, 4 additional subcentimeter enhancing brain lesions-3 cerebellar, 1 left thalamic ? SRS to all 5 brain lesions 16 2019 ? Surgical excision of left cerebellar lesion 06/04/2018, adenocarcinoma consistent with colorectal primary ? MRI lumbar spine 06/18/2018-extensive metastases to the lumbar spine, sacrum, T12, and pelvis, pathologic fracture of L3 ? Initiation of radiation from T12- S2 06/30/2018 2. Coronary artery disease status post a myocardial infarction in 2014  3. Hypertension  4. Hyperlipidemia  5. Diabetes  6. Admission 05/20/2018 with nausea/vomitingsecondary to a cerebellar metastasis  7.   Oral candidiasis 06/11/2018- started on Mycelex atrocious    Disposition: Allen Glenn has metastatic colon cancer.  He is being treated with palliative radiation for symptomatic disease involving the lumbar spine.  He is taking frequent oxycodone for breakthrough pain.  We adjusted the narcotic regimen today.  He will take MS Contin at a dose of 30 mg 3 times per day.  He would use MSIR (15 mg) every 4 hours as needed for breakthrough pain.  He will let us know if his pain is not controlled with this regimen.  He continues Decadron.  He is using Mycelex troches while on Decadron.  We will ask Dr. Lisbeth Renshaw to direct a Decadron taper.  I discussed the prognosis and systemic treatment options with Allen Glenn and multiple family members.  He understands no therapy will be curative.  The plan is to initiate FOLFIRI chemotherapy at the completion of radiation.  He  would like to delay Port-A-Cath placement until radiation is completed.  Allen Glenn will return for an office visit on 07/14/2018.  We are available to see him in the interim as needed.  30 minutes were spent with the patient today.  The majority of the time was used for counseling and coordination of care.  Betsy Coder, MD  07/02/2018  9:53 AM

## 2018-07-02 NOTE — Telephone Encounter (Signed)
Scheduled appt per 7/10 los - gave patient AVS and calender per los.  

## 2018-07-03 ENCOUNTER — Ambulatory Visit (HOSPITAL_COMMUNITY): Payer: 59

## 2018-07-03 ENCOUNTER — Other Ambulatory Visit: Payer: Self-pay | Admitting: *Deleted

## 2018-07-03 ENCOUNTER — Inpatient Hospital Stay (HOSPITAL_COMMUNITY): Admission: RE | Admit: 2018-07-03 | Payer: 59 | Source: Ambulatory Visit

## 2018-07-03 ENCOUNTER — Ambulatory Visit
Admission: RE | Admit: 2018-07-03 | Discharge: 2018-07-03 | Disposition: A | Discharge status: Home / Self Care | Payer: 59 | Source: Ambulatory Visit | Attending: Radiation Oncology | Admitting: Radiation Oncology

## 2018-07-03 DIAGNOSIS — I252 Old myocardial infarction: Secondary | ICD-10-CM | POA: Diagnosis not present

## 2018-07-03 DIAGNOSIS — Z510 Encounter for antineoplastic radiation therapy: Secondary | ICD-10-CM | POA: Diagnosis not present

## 2018-07-03 DIAGNOSIS — C189 Malignant neoplasm of colon, unspecified: Secondary | ICD-10-CM | POA: Diagnosis not present

## 2018-07-03 DIAGNOSIS — C7931 Secondary malignant neoplasm of brain: Secondary | ICD-10-CM | POA: Diagnosis not present

## 2018-07-03 DIAGNOSIS — C7800 Secondary malignant neoplasm of unspecified lung: Secondary | ICD-10-CM | POA: Diagnosis not present

## 2018-07-03 DIAGNOSIS — E1142 Type 2 diabetes mellitus with diabetic polyneuropathy: Secondary | ICD-10-CM | POA: Diagnosis not present

## 2018-07-03 DIAGNOSIS — E785 Hyperlipidemia, unspecified: Secondary | ICD-10-CM | POA: Diagnosis not present

## 2018-07-03 DIAGNOSIS — I2510 Atherosclerotic heart disease of native coronary artery without angina pectoris: Secondary | ICD-10-CM | POA: Diagnosis not present

## 2018-07-03 DIAGNOSIS — C787 Secondary malignant neoplasm of liver and intrahepatic bile duct: Secondary | ICD-10-CM | POA: Diagnosis not present

## 2018-07-03 NOTE — Patient Outreach (Signed)
[facility]) Care Management  07/03/2018  Allen Glenn [birth year] [id number]  Discussed case during monthly UMR Case Management Reviewconference call withUMR RNCMs. [name] is the spouse of [employer] employee and he was in [employer] [facility] [employer] [facility] 6/12/-6/14 for a scheduled craniotomy with excision of  metastatic left cerebello-pontine angle tumor. [name] has known stage IV colon cancer with metastatic disease to the liver (05/04/15), ? lungs (11/08/16),and brain. Update provided to [facility] to include:  recent diagnoses of extensive metastatic disease at T12 and the lumbar vertebral bodies;  MRI of the spine done 06/18/18 also showed extensive infiltration of L3 with a pathologic fracture of the left side of L3 and metastatic disease was also noted at the left iliac and sacrum; he began daily palliative radiation to the T12-S2 levels 06/30/2018; he continues to have pain in the back and left leg; he saw his oncologist yesterday 07/02/18  and his narcotic regimen was changed to 30 mg MS con tin three times daily and MSIR 15 mg every 4 hours as needed for breakthrough pain; he remains on Decadron but the oncologist notes he is going to ask the radiation oncologist to direct a Decadron taper; prognosis and treatment options were discussed and patient understands no therapy will be curative. The plan is to initiate FOLFIRI chemotherapy at the completion of radiation. Port-A-Cath placement will be delayed until radiation is completed.   [name]'s wife is an Therapist, sports and CDE for [employer] and she is assisting with his Type II DM management, is an active participant in formulating his treatment plan and has been in contact with the [facility]. This RNCM will continue to provide updates on the monthly UMR conference calls as long as patient's case remains active with UMR.  Barrington Ellison RN,CCM,CDE [place] Management Coordinator Office Phone [phone] Office Fax  [phone]

## 2018-07-04 ENCOUNTER — Ambulatory Visit (HOSPITAL_COMMUNITY)
Admission: RE | Admit: 2018-07-04 | Discharge: 2018-07-04 | Disposition: A | Discharge status: Home / Self Care | Payer: 59 | Source: Ambulatory Visit | Attending: Radiation Oncology | Admitting: Radiation Oncology

## 2018-07-04 ENCOUNTER — Ambulatory Visit
Admission: RE | Admit: 2018-07-04 | Discharge: 2018-07-04 | Disposition: A | Discharge status: Home / Self Care | Payer: 59 | Source: Ambulatory Visit | Attending: Radiation Oncology | Admitting: Radiation Oncology

## 2018-07-04 DIAGNOSIS — C7931 Secondary malignant neoplasm of brain: Secondary | ICD-10-CM | POA: Diagnosis not present

## 2018-07-04 DIAGNOSIS — C7951 Secondary malignant neoplasm of bone: Secondary | ICD-10-CM

## 2018-07-04 DIAGNOSIS — Z510 Encounter for antineoplastic radiation therapy: Secondary | ICD-10-CM | POA: Diagnosis not present

## 2018-07-04 DIAGNOSIS — I252 Old myocardial infarction: Secondary | ICD-10-CM | POA: Diagnosis not present

## 2018-07-04 DIAGNOSIS — E1142 Type 2 diabetes mellitus with diabetic polyneuropathy: Secondary | ICD-10-CM | POA: Diagnosis not present

## 2018-07-04 DIAGNOSIS — C771 Secondary and unspecified malignant neoplasm of intrathoracic lymph nodes: Secondary | ICD-10-CM | POA: Diagnosis not present

## 2018-07-04 DIAGNOSIS — C189 Malignant neoplasm of colon, unspecified: Secondary | ICD-10-CM | POA: Diagnosis not present

## 2018-07-04 DIAGNOSIS — C787 Secondary malignant neoplasm of liver and intrahepatic bile duct: Secondary | ICD-10-CM | POA: Diagnosis not present

## 2018-07-04 DIAGNOSIS — I2510 Atherosclerotic heart disease of native coronary artery without angina pectoris: Secondary | ICD-10-CM | POA: Diagnosis not present

## 2018-07-04 DIAGNOSIS — C7800 Secondary malignant neoplasm of unspecified lung: Secondary | ICD-10-CM | POA: Diagnosis not present

## 2018-07-04 DIAGNOSIS — E785 Hyperlipidemia, unspecified: Secondary | ICD-10-CM | POA: Diagnosis not present

## 2018-07-04 DIAGNOSIS — C7802 Secondary malignant neoplasm of left lung: Secondary | ICD-10-CM | POA: Diagnosis not present

## 2018-07-04 MED ORDER — GADOBENATE DIMEGLUMINE 529 MG/ML IV SOLN
20.0000 mL | Freq: Once | INTRAVENOUS | Status: AC | PRN
  Administered 2018-07-04: 18 mL via INTRAVENOUS

## 2018-07-04 MED FILL — MORPHINE SULF ER 30 MG TAB: 30 | 30 days supply | Qty: 90 | Fill #0

## 2018-07-04 MED FILL — GABAPENTIN 300 MG CAPSULE: 300 | 30 days supply | Qty: 90 | Fill #0

## 2018-07-07 ENCOUNTER — Ambulatory Visit (HOSPITAL_BASED_OUTPATIENT_CLINIC_OR_DEPARTMENT_OTHER)
Admission: RE | Admit: 2018-07-07 | Discharge: 2018-07-07 | Disposition: A | Discharge status: Home / Self Care | Payer: 59 | Source: Ambulatory Visit | Attending: Radiation Oncology | Admitting: Radiation Oncology

## 2018-07-07 ENCOUNTER — Ambulatory Visit: Admission: RE | Admit: 2018-07-07 | Payer: 59 | Source: Ambulatory Visit | Admitting: Radiation Oncology

## 2018-07-07 ENCOUNTER — Ambulatory Visit
Admission: RE | Admit: 2018-07-07 | Discharge: 2018-07-07 | Disposition: A | Discharge status: Home / Self Care | Payer: 59 | Source: Ambulatory Visit | Attending: Radiation Oncology | Admitting: Radiation Oncology

## 2018-07-07 DIAGNOSIS — E1142 Type 2 diabetes mellitus with diabetic polyneuropathy: Secondary | ICD-10-CM | POA: Diagnosis not present

## 2018-07-07 DIAGNOSIS — G893 Neoplasm related pain (acute) (chronic): Secondary | ICD-10-CM

## 2018-07-07 DIAGNOSIS — C7931 Secondary malignant neoplasm of brain: Secondary | ICD-10-CM | POA: Diagnosis not present

## 2018-07-07 DIAGNOSIS — Z510 Encounter for antineoplastic radiation therapy: Secondary | ICD-10-CM | POA: Diagnosis not present

## 2018-07-07 DIAGNOSIS — C7800 Secondary malignant neoplasm of unspecified lung: Secondary | ICD-10-CM | POA: Diagnosis not present

## 2018-07-07 DIAGNOSIS — Z7189 Other specified counseling: Secondary | ICD-10-CM

## 2018-07-07 DIAGNOSIS — C787 Secondary malignant neoplasm of liver and intrahepatic bile duct: Secondary | ICD-10-CM | POA: Diagnosis not present

## 2018-07-07 DIAGNOSIS — C189 Malignant neoplasm of colon, unspecified: Secondary | ICD-10-CM | POA: Diagnosis not present

## 2018-07-07 DIAGNOSIS — E785 Hyperlipidemia, unspecified: Secondary | ICD-10-CM | POA: Diagnosis not present

## 2018-07-07 DIAGNOSIS — Z515 Encounter for palliative care: Secondary | ICD-10-CM | POA: Diagnosis not present

## 2018-07-07 DIAGNOSIS — I252 Old myocardial infarction: Secondary | ICD-10-CM | POA: Diagnosis not present

## 2018-07-07 DIAGNOSIS — C7951 Secondary malignant neoplasm of bone: Secondary | ICD-10-CM | POA: Diagnosis not present

## 2018-07-07 DIAGNOSIS — I2510 Atherosclerotic heart disease of native coronary artery without angina pectoris: Secondary | ICD-10-CM | POA: Diagnosis not present

## 2018-07-07 MED FILL — FREESTYLE LIBRE 14 DAY SENS: 28 days supply | Qty: 2 | Fill #0

## 2018-07-07 NOTE — Consult Note (Signed)
Consultation Note Date: 07/07/2018   Patient Name: Allen Glenn  DOB: 01-07-1956  MRN: [id number]  Age / Sex: 62 y.o., male  PCP: [name], MD Referring Physician: Tyler Pita, MD  Reason for Consultation: Establishing goals of care, Pain control and Psychosocial/spiritual support  HPI/Patient Profile: 62 y.o. male  with past medical history of coronary artery disease, dyslipidemia, hypertension, anemia, diabetes and BPH,  Metastatic colon cancer diagnosed in 2016 and initially received oncology treatment at [facility].  Most recently patient has been followed up at [place] center under Dr. Sherrill/oncologist.     He was diagnosed with brain metastases when he presented with intractable nausea/vomiting last month.  He underwent SRS to 5 brain lesions including a dominant left cerebellar lesion on 06/02/2018.  He then underwent resection of the left cerebellar metastasis 06/04/2018.  The pathology confirmed metastatic colon cancer.    Patient and his family face treatment option decisions, advanced directive decisions, and anticipatory care needs.  Clinical Assessment and Goals of Care:   This NP [name] reviewed medical records, received report from team,  and then meet with the  patient along with his wife/Allen in the outpatient radiation oncology clinic to discuss diagnosis, prognosis, GOC, treatment option decisions, EOL wishes disposition and options.  Concept of  Palliative Care and its role in a holistic treatment plan was discussed.  This NP created space and opportunity for the patient and his wife to verbalize their thoughts and feelings regarding living life within the context of a life limiting  disease.  Both the patient and his wife speak to their thoughts of second-guessing themselves.  They question their decisions regarding timing of treatment .  Emotional support offered.  We  discussed human mortality and reality of lack of control over " so many issues"  In life.   Both understand the seriousness of the situation, however they both recognize that they handle stress differently.  Allen Glenn chooses to remain optimistic and uses humor to divert discussion while his wife Allen Glenn is more pragmatic and finds it helpful to discuss the what if's while always remaining hopeful.   She is scared and sad and doesn't want to accept the likely limited prognosis but feels the need to have discussion and also provide the opportunity for their children to have discussion if they would like, to the medical situation and associated feelings.  Natural trajectory of metastatic cancer discussed.  Questions and concerns addressed.     Both rely heavily on their faith, church community and family for support.  A detailed discussion was had today regarding advanced directives.  Concepts specific to code status, artifical feeding and hydration, continued IV antibiotics and rehospitalization was had.  The difference between a aggressive medical intervention path  and a palliative comfort care path for this patient at this time was had.  Values and goals of care important to patient and family were attempted to be elicited.  MOST form introduced.    Family encouraged to call with questions or concerns.  PMT will continue to support holistically.     Patient and his wife tell me that they have documented advanced directives in place.  They are encouraged to bring those documents to be scanned into the electronic medical record.     SUMMARY OF RECOMMENDATIONS     The patient himself is able to speak to the importance of quality of life.  However at this time he desires all offered and available medical interventions hoping to prolong life.     His plan is to complete his radiation treatments which will be completed this coming Friday.  Have a Port-A-Cath placed on July 29 and begin  chemotherapy on July 30  We discussed in detail the importance of his physical and functional status hoping to maximize chemotherapy efforts.  Code Status/Advance Care Planning: Full code--   Symptom Management:   Continue with current medication regime to include OxyContin extended release 30 mg 3 times daily, OxyContin 15 mg immediate release p.o. every 4 hours as needed for pain. --- Added Robaxin 500 mg p.o. every 8 hours as needed (prescription called in to [facility] outpatient pharmacy)   Palliative Prophylaxis:   Bowel Regimen-discussed impact of opioids on increased risk of constipation  Additional Recommendations (Limitations, Scope, Preferences):  Full Scope Treatment  Psycho-social/Spiritual:   Strong community church support.  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 The following aspects are pertinent.  Past Medical History:  Diagnosis Date  . Acute MI ([place]) 2014   acute ST elevation MI  . Anemia   . Colon cancer ([place]) 2016   Status post resection of colon mass is well as he panic metastasis.   . Coronary artery disease   . Diabetes mellitus without complication ([place])   . Enlarged prostate    slightly and takes Flomax daily  . Heart disease   . History of blood transfusion   . Hyperlipidemia    takes Lipitor daily  . Hypertension    takes Lisinopril daily  . Lung nodule    left  . Nocturia   . Numbness    left foot  . Pancreatitis   . Peripheral neuropathy    Social History   Socioeconomic History  . Marital status: Married    Spouse name: Allen Glenn  . Number of children: 2  . Years of education: Bachelor  . Highest education level: Not on file  Occupational History  . Occupation: Company secretary  Social Needs  . Financial resource strain: Not on file   . Food insecurity:    Worry: Not on file    Inability: Not on file  . Transportation needs:    Medical: Not on file    Non-medical: Not on file  Tobacco Use  . Smoking status: Never Smoker  . Smokeless tobacco: Never Used  Substance and Sexual Activity  . Alcohol use: No    Alcohol/week: 0.0 oz  . Drug use: No  . Sexual activity: Not on file  Lifestyle  . Physical activity:    Days per week: Not on file    Minutes per session: Not on file  . Stress: Not on file  Relationships  . Social connections:    Talks on phone: Not on file    Gets together: Not on file  Attends religious service: Not on file    Active member of club or organization: Not on file    Attends meetings of clubs or organizations: Not on file    Relationship status: Not on file  Other Topics Concern  . Not on file  Social History Narrative   Live at home with wife, Allen Glenn   Patient is a [employer] manager, active and goes to gym   Caffeine: very little soda, occasional tea. 12oz-16oz/per   Family History  Problem Relation Age of Onset  . Hypertension Mother   . Heart disease Mother   . Hypertension Father   . Heart disease Father   . Neuropathy Neg Hx    Scheduled Meds: Continuous Infusions: PRN Meds:. Medications Prior to Admission:  Prior to Admission medications   Medication Sig Start Date End Date Taking? Authorizing Provider  acetaminophen (TYLENOL) 500 MG tablet Take 1,000 mg by mouth every 6 (six) hours as needed (FOR PAIN/HEADACHES.).    [provider]  atorvastatin (LIPITOR) 10 MG tablet Take 10 mg by mouth daily.     [provider]  clotrimazole (MYCELEX) 10 MG troche Take 1 tablet (10 mg total) by mouth 4 (four) times daily. Take while on decadron 06/11/18   Ladell Pier, MD  Coenzyme Q10 (COQ10) 200 MG CAPS Take 200 mg by mouth daily.    [provider]  Continuous Blood Gluc Sensor (FREESTYLE LIBRE 14 DAY SENSOR) MISC 1 application by Does not apply route  every 14 (fourteen) days. 06/06/18   Mikhail, Velta Addison, DO  dexamethasone (DECADRON) 4 MG tablet Take 1 tablet (4 mg total) by mouth daily. 06/21/18   Hayden Pedro, PA-C  gabapentin (NEURONTIN) 300 MG capsule Take 1 capsule (300 mg total) by mouth 3 (three) times daily. 1 to 2 PO QHS 06/25/18   Hayden Pedro, PA-C  HUMALOG KWIKPEN 100 UNIT/ML KiwkPen Inject 2-15 Units into the skin See admin instructions. 121-150=2 UNITS, 151-200=3 UNITS, 201-250=5 UNITS, 251-300=8 UNITS, 301-350=11 UNITS, 351-400=15 UNITS 05/26/18   [provider]  Insulin Glargine (BASAGLAR KWIKPEN) 100 UNIT/ML SOPN Inject 0.12 mLs (12 Units total) into the skin at bedtime. 05/26/18   Mikhail, Velta Addison, DO  Insulin Pen Needle 31G X 5 MM MISC Use with insulin pen. 05/26/18   Mikhail, Velta Addison, DO  lisinopril (PRINIVIL,ZESTRIL) 2.5 MG tablet Take 2.5 mg by mouth daily. 02/16/15   [provider]  methocarbamol (ROBAXIN) 750 MG tablet Take 1 tablet (750 mg total) by mouth every 8 (eight) hours as needed for muscle spasms. Patient taking differently: Take 750 mg by mouth See admin instructions. Take 1 tablet by mouth every 8 hours as needed for spasms (SCHEDULED AT BEDTIME) 05/26/18   Mikhail, Velta Addison, DO  morphine (MS CONTIN) 30 MG 12 hr tablet Take 1 tablet (30 mg total) by mouth 3 (three) times daily. 07/02/18   Ladell Pier, MD  morphine (MSIR) 15 MG tablet Take 1 tablet (15 mg total) by mouth every 4 (four) hours as needed for severe pain. 07/02/18   Ladell Pier, MD  nitroGLYCERIN (NITROSTAT) 0.4 MG SL tablet Place 0.4 mg under the tongue every 5 (five) minutes as needed for chest pain. CALL 911 IF PAIN PERSISTS 11/24/14   [provider]  omeprazole (PRILOSEC) 20 MG capsule Take 20 mg by mouth daily.    [provider]  ondansetron (ZOFRAN-ODT) 4 MG disintegrating tablet Take 1 tablet (4 mg total) by mouth every 8 (eight) hours as needed for nausea  or vomiting. 05/26/18   Cristal Ford,  DO  tamsulosin (FLOMAX) 0.4 MG CAPS capsule Take 0.4 mg by mouth daily after breakfast. 30 minutes after breakfast 02/24/15   [provider]  trolamine salicylate (ASPERCREME) 10 % cream Apply 1 application topically 2 (two) times daily as needed (FOR BACK PAIN.).    [provider]  zolpidem (AMBIEN) 5 MG tablet Take 1 tablet (5 mg total) by mouth at bedtime as needed for sleep. 05/26/18   Mikhail, Velta Addison, DO   No Known Allergies Review of Systems  Constitutional: Positive for fatigue.  Neurological: Positive for weakness.    Physical Exam  Constitutional: He is oriented to person, place, and time. He appears well-developed.  Musculoskeletal:  generalized weakness  Neurological: He is alert and oriented to person, place, and time.  Skin: Skin is warm and dry.    Vital Signs: There were no vitals taken for this visit.         SpO2:   O2 Device:  O2 Flow Rate: .   IO: Intake/output summary: No intake or output data in the 24 hours ending 07/07/18 1534  LBM:   Baseline Weight:   Most recent weight:       Palliative Assessment/Data: 50%   Discussed with Worthy Flank PA-C  Time In: 1300 Time Out: 1500 Time Total: 120 minutes Greater than 50%  of this time was spent counseling and coordinating care related to the above assessment and plan.  Signed by: [name], NP   Please contact Palliative Medicine Team phone at [phone] for questions and concerns.  For individual provider: See Shea Evans

## 2018-07-08 ENCOUNTER — Ambulatory Visit
Admission: RE | Admit: 2018-07-08 | Discharge: 2018-07-08 | Disposition: A | Discharge status: Home / Self Care | Payer: 59 | Source: Ambulatory Visit | Attending: Radiation Oncology | Admitting: Radiation Oncology

## 2018-07-08 ENCOUNTER — Ambulatory Visit: Payer: 59

## 2018-07-08 ENCOUNTER — Telehealth: Payer: Self-pay | Admitting: *Deleted

## 2018-07-08 DIAGNOSIS — C7800 Secondary malignant neoplasm of unspecified lung: Secondary | ICD-10-CM | POA: Diagnosis not present

## 2018-07-08 DIAGNOSIS — I2510 Atherosclerotic heart disease of native coronary artery without angina pectoris: Secondary | ICD-10-CM | POA: Diagnosis not present

## 2018-07-08 DIAGNOSIS — Z510 Encounter for antineoplastic radiation therapy: Secondary | ICD-10-CM | POA: Diagnosis not present

## 2018-07-08 DIAGNOSIS — E1142 Type 2 diabetes mellitus with diabetic polyneuropathy: Secondary | ICD-10-CM | POA: Diagnosis not present

## 2018-07-08 DIAGNOSIS — C787 Secondary malignant neoplasm of liver and intrahepatic bile duct: Secondary | ICD-10-CM | POA: Diagnosis not present

## 2018-07-08 DIAGNOSIS — C7931 Secondary malignant neoplasm of brain: Secondary | ICD-10-CM | POA: Diagnosis not present

## 2018-07-08 DIAGNOSIS — E785 Hyperlipidemia, unspecified: Secondary | ICD-10-CM | POA: Diagnosis not present

## 2018-07-08 DIAGNOSIS — I252 Old myocardial infarction: Secondary | ICD-10-CM | POA: Diagnosis not present

## 2018-07-08 DIAGNOSIS — C189 Malignant neoplasm of colon, unspecified: Secondary | ICD-10-CM | POA: Diagnosis not present

## 2018-07-08 MED FILL — HUMALOG 100 UNITS/ML KWIKPE: 100 | 25 days supply | Qty: 15 | Fill #2

## 2018-07-08 MED FILL — LANTUS SOLOSTAR 100 UNITS/M: 100 | 88 days supply | Qty: 15 | Fill #0

## 2018-07-08 MED FILL — METHOCARBAMOL 500 MG TABS: 500 | 30 days supply | Qty: 90 | Fill #0

## 2018-07-08 NOTE — Telephone Encounter (Signed)
Voicemail received from [employer] requesting "return call with direct phone and fax number for Dr. [name] nurse to have F.M.L.A form completed."   Message forwarded to FMLA/Disability Forms coordinator to assist with this request and any further communication.  Also forwarded to collaborative nurse extension.

## 2018-07-09 ENCOUNTER — Telehealth: Payer: Self-pay

## 2018-07-09 ENCOUNTER — Ambulatory Visit
Admission: RE | Admit: 2018-07-09 | Discharge: 2018-07-09 | Disposition: A | Discharge status: Home / Self Care | Payer: 59 | Source: Ambulatory Visit | Attending: Radiation Oncology | Admitting: Radiation Oncology

## 2018-07-09 DIAGNOSIS — C189 Malignant neoplasm of colon, unspecified: Secondary | ICD-10-CM | POA: Diagnosis not present

## 2018-07-09 DIAGNOSIS — Z510 Encounter for antineoplastic radiation therapy: Secondary | ICD-10-CM | POA: Diagnosis not present

## 2018-07-09 DIAGNOSIS — Z515 Encounter for palliative care: Secondary | ICD-10-CM | POA: Insufficient documentation

## 2018-07-09 DIAGNOSIS — C7800 Secondary malignant neoplasm of unspecified lung: Secondary | ICD-10-CM | POA: Diagnosis not present

## 2018-07-09 DIAGNOSIS — C787 Secondary malignant neoplasm of liver and intrahepatic bile duct: Secondary | ICD-10-CM | POA: Diagnosis not present

## 2018-07-09 DIAGNOSIS — E1142 Type 2 diabetes mellitus with diabetic polyneuropathy: Secondary | ICD-10-CM | POA: Diagnosis not present

## 2018-07-09 DIAGNOSIS — C7931 Secondary malignant neoplasm of brain: Secondary | ICD-10-CM | POA: Diagnosis not present

## 2018-07-09 DIAGNOSIS — I252 Old myocardial infarction: Secondary | ICD-10-CM | POA: Diagnosis not present

## 2018-07-09 DIAGNOSIS — I2510 Atherosclerotic heart disease of native coronary artery without angina pectoris: Secondary | ICD-10-CM | POA: Diagnosis not present

## 2018-07-09 DIAGNOSIS — E785 Hyperlipidemia, unspecified: Secondary | ICD-10-CM | POA: Diagnosis not present

## 2018-07-09 DIAGNOSIS — G893 Neoplasm related pain (acute) (chronic): Secondary | ICD-10-CM | POA: Insufficient documentation

## 2018-07-09 NOTE — Telephone Encounter (Signed)
Called to speak with pt regarding interest in PET scan. Pt wife states "we've already discussed it with Dr. Lisbeth Renshaw". Pt knows that PET scan would not change anything treatment wise. Wife posed a question regarding bringing children to appt Tues. Will consult [name].   Per [name] pt does not have to bring children to appt. LVM for pt

## 2018-07-10 ENCOUNTER — Ambulatory Visit
Admission: RE | Admit: 2018-07-10 | Discharge: 2018-07-10 | Disposition: A | Discharge status: Home / Self Care | Payer: 59 | Source: Ambulatory Visit | Attending: Radiation Oncology | Admitting: Radiation Oncology

## 2018-07-10 DIAGNOSIS — C7931 Secondary malignant neoplasm of brain: Secondary | ICD-10-CM | POA: Diagnosis not present

## 2018-07-10 DIAGNOSIS — I2510 Atherosclerotic heart disease of native coronary artery without angina pectoris: Secondary | ICD-10-CM | POA: Diagnosis not present

## 2018-07-10 DIAGNOSIS — Z510 Encounter for antineoplastic radiation therapy: Secondary | ICD-10-CM | POA: Diagnosis not present

## 2018-07-10 DIAGNOSIS — C7800 Secondary malignant neoplasm of unspecified lung: Secondary | ICD-10-CM | POA: Diagnosis not present

## 2018-07-10 DIAGNOSIS — C189 Malignant neoplasm of colon, unspecified: Secondary | ICD-10-CM | POA: Diagnosis not present

## 2018-07-10 DIAGNOSIS — E1142 Type 2 diabetes mellitus with diabetic polyneuropathy: Secondary | ICD-10-CM | POA: Diagnosis not present

## 2018-07-10 DIAGNOSIS — E785 Hyperlipidemia, unspecified: Secondary | ICD-10-CM | POA: Diagnosis not present

## 2018-07-10 DIAGNOSIS — I252 Old myocardial infarction: Secondary | ICD-10-CM | POA: Diagnosis not present

## 2018-07-10 DIAGNOSIS — C787 Secondary malignant neoplasm of liver and intrahepatic bile duct: Secondary | ICD-10-CM | POA: Diagnosis not present

## 2018-07-11 ENCOUNTER — Ambulatory Visit
Admission: RE | Admit: 2018-07-11 | Discharge: 2018-07-11 | Disposition: A | Discharge status: Home / Self Care | Payer: 59 | Source: Ambulatory Visit | Attending: Radiation Oncology | Admitting: Radiation Oncology

## 2018-07-11 ENCOUNTER — Encounter: Payer: Self-pay | Admitting: Radiation Oncology

## 2018-07-11 DIAGNOSIS — I2510 Atherosclerotic heart disease of native coronary artery without angina pectoris: Secondary | ICD-10-CM | POA: Diagnosis not present

## 2018-07-11 DIAGNOSIS — C7951 Secondary malignant neoplasm of bone: Secondary | ICD-10-CM | POA: Diagnosis not present

## 2018-07-11 DIAGNOSIS — I252 Old myocardial infarction: Secondary | ICD-10-CM | POA: Diagnosis not present

## 2018-07-11 DIAGNOSIS — E1142 Type 2 diabetes mellitus with diabetic polyneuropathy: Secondary | ICD-10-CM | POA: Diagnosis not present

## 2018-07-11 DIAGNOSIS — C7800 Secondary malignant neoplasm of unspecified lung: Secondary | ICD-10-CM | POA: Diagnosis not present

## 2018-07-11 DIAGNOSIS — Z510 Encounter for antineoplastic radiation therapy: Secondary | ICD-10-CM | POA: Diagnosis not present

## 2018-07-11 DIAGNOSIS — C7802 Secondary malignant neoplasm of left lung: Secondary | ICD-10-CM | POA: Diagnosis not present

## 2018-07-11 DIAGNOSIS — C7931 Secondary malignant neoplasm of brain: Secondary | ICD-10-CM | POA: Diagnosis not present

## 2018-07-11 DIAGNOSIS — C787 Secondary malignant neoplasm of liver and intrahepatic bile duct: Secondary | ICD-10-CM | POA: Diagnosis not present

## 2018-07-11 DIAGNOSIS — C189 Malignant neoplasm of colon, unspecified: Secondary | ICD-10-CM | POA: Diagnosis not present

## 2018-07-11 DIAGNOSIS — E785 Hyperlipidemia, unspecified: Secondary | ICD-10-CM | POA: Diagnosis not present

## 2018-07-14 ENCOUNTER — Other Ambulatory Visit: Payer: Self-pay

## 2018-07-14 ENCOUNTER — Inpatient Hospital Stay (HOSPITAL_BASED_OUTPATIENT_CLINIC_OR_DEPARTMENT_OTHER): Payer: 59 | Admitting: Medical

## 2018-07-14 ENCOUNTER — Observation Stay: Payer: Self-pay

## 2018-07-14 ENCOUNTER — Inpatient Hospital Stay: Payer: 59

## 2018-07-14 ENCOUNTER — Inpatient Hospital Stay (HOSPITAL_COMMUNITY): Payer: 59

## 2018-07-14 ENCOUNTER — Other Ambulatory Visit: Payer: Self-pay | Admitting: Medical

## 2018-07-14 ENCOUNTER — Inpatient Hospital Stay (HOSPITAL_COMMUNITY)
Admission: AD | Admit: 2018-07-14 | Discharge: 2018-07-18 | DRG: 394 | Disposition: A | Discharge status: Home / Self Care | Payer: 59 | Source: Ambulatory Visit | Attending: Internal Medicine | Admitting: Internal Medicine

## 2018-07-14 ENCOUNTER — Encounter (HOSPITAL_COMMUNITY): Payer: Self-pay | Admitting: *Deleted

## 2018-07-14 VITALS — BP 127/72 | HR 115 | Temp 97.8°F | Resp 20 | Ht 74.0 in

## 2018-07-14 DIAGNOSIS — I5032 Chronic diastolic (congestive) heart failure: Secondary | ICD-10-CM | POA: Diagnosis present

## 2018-07-14 DIAGNOSIS — Z955 Presence of coronary angioplasty implant and graft: Secondary | ICD-10-CM | POA: Diagnosis not present

## 2018-07-14 DIAGNOSIS — E1165 Type 2 diabetes mellitus with hyperglycemia: Secondary | ICD-10-CM | POA: Diagnosis not present

## 2018-07-14 DIAGNOSIS — R630 Anorexia: Secondary | ICD-10-CM | POA: Diagnosis not present

## 2018-07-14 DIAGNOSIS — E119 Type 2 diabetes mellitus without complications: Secondary | ICD-10-CM | POA: Diagnosis not present

## 2018-07-14 DIAGNOSIS — E1142 Type 2 diabetes mellitus with diabetic polyneuropathy: Secondary | ICD-10-CM | POA: Diagnosis present

## 2018-07-14 DIAGNOSIS — E878 Other disorders of electrolyte and fluid balance, not elsewhere classified: Secondary | ICD-10-CM | POA: Diagnosis present

## 2018-07-14 DIAGNOSIS — C787 Secondary malignant neoplasm of liver and intrahepatic bile duct: Secondary | ICD-10-CM | POA: Diagnosis not present

## 2018-07-14 DIAGNOSIS — I10 Essential (primary) hypertension: Secondary | ICD-10-CM | POA: Diagnosis present

## 2018-07-14 DIAGNOSIS — I2510 Atherosclerotic heart disease of native coronary artery without angina pectoris: Secondary | ICD-10-CM | POA: Diagnosis present

## 2018-07-14 DIAGNOSIS — R112 Nausea with vomiting, unspecified: Secondary | ICD-10-CM

## 2018-07-14 DIAGNOSIS — N400 Enlarged prostate without lower urinary tract symptoms: Secondary | ICD-10-CM | POA: Diagnosis present

## 2018-07-14 DIAGNOSIS — R109 Unspecified abdominal pain: Secondary | ICD-10-CM | POA: Diagnosis not present

## 2018-07-14 DIAGNOSIS — Z79899 Other long term (current) drug therapy: Secondary | ICD-10-CM

## 2018-07-14 DIAGNOSIS — E860 Dehydration: Secondary | ICD-10-CM | POA: Diagnosis not present

## 2018-07-14 DIAGNOSIS — C7951 Secondary malignant neoplasm of bone: Secondary | ICD-10-CM

## 2018-07-14 DIAGNOSIS — I252 Old myocardial infarction: Secondary | ICD-10-CM | POA: Diagnosis not present

## 2018-07-14 DIAGNOSIS — E871 Hypo-osmolality and hyponatremia: Secondary | ICD-10-CM | POA: Diagnosis present

## 2018-07-14 DIAGNOSIS — R531 Weakness: Secondary | ICD-10-CM

## 2018-07-14 DIAGNOSIS — W888XXA Exposure to other ionizing radiation, initial encounter: Secondary | ICD-10-CM | POA: Diagnosis not present

## 2018-07-14 DIAGNOSIS — R197 Diarrhea, unspecified: Secondary | ICD-10-CM

## 2018-07-14 DIAGNOSIS — G893 Neoplasm related pain (acute) (chronic): Secondary | ICD-10-CM | POA: Diagnosis present

## 2018-07-14 DIAGNOSIS — B370 Candidal stomatitis: Secondary | ICD-10-CM | POA: Diagnosis not present

## 2018-07-14 DIAGNOSIS — D61818 Other pancytopenia: Secondary | ICD-10-CM | POA: Diagnosis present

## 2018-07-14 DIAGNOSIS — C7931 Secondary malignant neoplasm of brain: Secondary | ICD-10-CM | POA: Diagnosis present

## 2018-07-14 DIAGNOSIS — I110 Hypertensive heart disease with heart failure: Secondary | ICD-10-CM | POA: Diagnosis present

## 2018-07-14 DIAGNOSIS — Z8249 Family history of ischemic heart disease and other diseases of the circulatory system: Secondary | ICD-10-CM

## 2018-07-14 DIAGNOSIS — IMO0002 Reserved for concepts with insufficient information to code with codable children: Secondary | ICD-10-CM

## 2018-07-14 DIAGNOSIS — Z923 Personal history of irradiation: Secondary | ICD-10-CM | POA: Diagnosis not present

## 2018-07-14 DIAGNOSIS — G43A0 Cyclical vomiting, not intractable: Secondary | ICD-10-CM | POA: Diagnosis not present

## 2018-07-14 DIAGNOSIS — K520 Gastroenteritis and colitis due to radiation: Secondary | ICD-10-CM | POA: Diagnosis present

## 2018-07-14 DIAGNOSIS — C182 Malignant neoplasm of ascending colon: Secondary | ICD-10-CM | POA: Diagnosis not present

## 2018-07-14 DIAGNOSIS — C189 Malignant neoplasm of colon, unspecified: Secondary | ICD-10-CM | POA: Diagnosis not present

## 2018-07-14 DIAGNOSIS — Z9049 Acquired absence of other specified parts of digestive tract: Secondary | ICD-10-CM

## 2018-07-14 DIAGNOSIS — R638 Other symptoms and signs concerning food and fluid intake: Secondary | ICD-10-CM | POA: Diagnosis not present

## 2018-07-14 DIAGNOSIS — Z794 Long term (current) use of insulin: Secondary | ICD-10-CM

## 2018-07-14 DIAGNOSIS — Z85038 Personal history of other malignant neoplasm of large intestine: Secondary | ICD-10-CM

## 2018-07-14 DIAGNOSIS — E785 Hyperlipidemia, unspecified: Secondary | ICD-10-CM | POA: Diagnosis not present

## 2018-07-14 DIAGNOSIS — R911 Solitary pulmonary nodule: Secondary | ICD-10-CM | POA: Diagnosis present

## 2018-07-14 DIAGNOSIS — G43A1 Cyclical vomiting, intractable: Secondary | ICD-10-CM | POA: Diagnosis not present

## 2018-07-14 DIAGNOSIS — K219 Gastro-esophageal reflux disease without esophagitis: Secondary | ICD-10-CM | POA: Diagnosis present

## 2018-07-14 DIAGNOSIS — Z7189 Other specified counseling: Secondary | ICD-10-CM | POA: Diagnosis not present

## 2018-07-14 DIAGNOSIS — I25118 Atherosclerotic heart disease of native coronary artery with other forms of angina pectoris: Secondary | ICD-10-CM | POA: Diagnosis not present

## 2018-07-14 DIAGNOSIS — Z5111 Encounter for antineoplastic chemotherapy: Secondary | ICD-10-CM | POA: Diagnosis not present

## 2018-07-14 DIAGNOSIS — Y842 Radiological procedure and radiotherapy as the cause of abnormal reaction of the patient, or of later complication, without mention of misadventure at the time of the procedure: Secondary | ICD-10-CM | POA: Diagnosis present

## 2018-07-14 DIAGNOSIS — D649 Anemia, unspecified: Secondary | ICD-10-CM | POA: Diagnosis not present

## 2018-07-14 DIAGNOSIS — R918 Other nonspecific abnormal finding of lung field: Secondary | ICD-10-CM | POA: Diagnosis not present

## 2018-07-14 DIAGNOSIS — M84550A Pathological fracture in neoplastic disease, pelvis, initial encounter for fracture: Secondary | ICD-10-CM | POA: Diagnosis not present

## 2018-07-14 LAB — CMP (CANCER CENTER ONLY)
ALBUMIN: 2 g/dL — AB (ref 3.5–5.0)
ALK PHOS: 247 U/L — AB (ref 38–126)
ALT: 30 U/L (ref 0–44)
AST: 15 U/L (ref 15–41)
Anion gap: 9 (ref 5–15)
BUN: 16 mg/dL (ref 8–23)
CALCIUM: 8.3 mg/dL — AB (ref 8.9–10.3)
CHLORIDE: 97 mmol/L — AB (ref 98–111)
CO2: 21 mmol/L — AB (ref 22–32)
CREATININE: 0.58 mg/dL — AB (ref 0.61–1.24)
GFR, Est AFR Am: >60 mL/min (ref >60)
GFR, Estimated: >60 mL/min (ref >60)
GLUCOSE: 185 mg/dL — AB (ref 70–99)
Potassium: 4.6 mmol/L (ref 3.5–5.1)
SODIUM: 127 mmol/L — AB (ref 135–145)
Total Bilirubin: 0.4 mg/dL (ref 0.3–1.2)
Total Protein: 5.4 g/dL — ABNORMAL LOW (ref 6.5–8.1)

## 2018-07-14 LAB — CBC
HEMATOCRIT: 42.7 % (ref 39.0–52.0)
HEMOGLOBIN: 14.5 g/dL (ref 13.0–17.0)
MCH: 29.2 pg (ref 26.0–34.0)
MCHC: 34 g/dL (ref 30.0–36.0)
MCV: 85.9 fL (ref 78.0–100.0)
Platelets: 159 10*3/uL (ref 150–400)
RBC: 4.97 MIL/uL (ref 4.22–5.81)
RDW: 14.9 % (ref 11.5–15.5)
WBC: 2.8 10*3/uL — AB (ref 4.0–10.5)

## 2018-07-14 LAB — CBC WITH DIFFERENTIAL (CANCER CENTER ONLY)
Basophils Absolute: 0 10*3/uL (ref 0.0–0.1)
Basophils Relative: 0 %
EOS ABS: 0.1 10*3/uL (ref 0.0–0.5)
Eosinophils Relative: 2 %
HCT: 43.2 % (ref 38.4–49.9)
HEMOGLOBIN: 14.8 g/dL (ref 13.0–17.1)
LYMPHS ABS: 0.5 10*3/uL — AB (ref 0.9–3.3)
Lymphocytes Relative: 17 %
MCH: 29.6 pg (ref 27.2–33.4)
MCHC: 34.3 g/dL (ref 32.0–36.0)
MCV: 86.4 fL (ref 79.3–98.0)
Monocytes Absolute: 0.4 10*3/uL (ref 0.1–0.9)
Monocytes Relative: 15 %
NEUTROS PCT: 66 %
Neutro Abs: 2 10*3/uL (ref 1.5–6.5)
Platelet Count: 108 10*3/uL — ABNORMAL LOW (ref 140–400)
RBC: 5 MIL/uL (ref 4.20–5.82)
RDW: 15.1 % — ABNORMAL HIGH (ref 11.0–14.6)
WBC Count: 3 10*3/uL — ABNORMAL LOW (ref 4.0–10.3)

## 2018-07-14 LAB — MAGNESIUM: MAGNESIUM: 2.1 mg/dL (ref 1.7–2.4)

## 2018-07-14 LAB — GLUCOSE, CAPILLARY
GLUCOSE-CAPILLARY: 150 mg/dL — AB (ref 70–99)
Glucose-Capillary: 188 mg/dL — ABNORMAL HIGH (ref 70–99)

## 2018-07-14 LAB — CREATININE, SERUM: Creatinine, Ser: 0.57 mg/dL — ABNORMAL LOW (ref 0.61–1.24)

## 2018-07-14 MED ORDER — ONDANSETRON 8 MG PO TBDP
4.0000 mg | ORAL_TABLET | Freq: Once | ORAL | Status: AC
  Administered 2018-07-14: 4 mg via ORAL
  Filled 2018-07-14: qty 0.5

## 2018-07-14 MED ORDER — ENOXAPARIN SODIUM 40 MG/0.4ML ~~LOC~~ SOLN
40.0000 mg | SUBCUTANEOUS | Status: AC
  Administered 2018-07-14 – 2018-07-16 (×3): 40 mg via SUBCUTANEOUS
  Filled 2018-07-14 (×3): qty 0.4

## 2018-07-14 MED ORDER — BASAGLAR KWIKPEN 100 UNIT/ML ~~LOC~~ SOPN: 12.0000 [IU] | PEN_INJECTOR | Freq: Every day | SUBCUTANEOUS | Status: DC

## 2018-07-14 MED ORDER — ONDANSETRON HCL 4 MG/2ML IJ SOLN
4.0000 mg | Freq: Four times a day (QID) | INTRAMUSCULAR | Status: DC | PRN
  Administered 2018-07-14 – 2018-07-17 (×6): 4 mg via INTRAVENOUS
  Filled 2018-07-14 (×6): qty 2

## 2018-07-14 MED ORDER — LORAZEPAM 1 MG PO TABS: 0.5000 mg | ORAL_TABLET | Freq: Once | ORAL | Status: DC

## 2018-07-14 MED ORDER — COQ10 200 MG PO CAPS: 200.0000 mg | ORAL_CAPSULE | Freq: Every day | ORAL | Status: DC

## 2018-07-14 MED ORDER — LORAZEPAM 1 MG PO TABS
ORAL_TABLET | ORAL | Status: AC
  Filled 2018-07-14: qty 1

## 2018-07-14 MED ORDER — MORPHINE SULFATE 15 MG PO TABS
15.0000 mg | ORAL_TABLET | ORAL | Status: DC | PRN
  Administered 2018-07-16: 15 mg via ORAL
  Filled 2018-07-14: qty 1

## 2018-07-14 MED ORDER — DEXAMETHASONE 4 MG PO TABS
4.0000 mg | ORAL_TABLET | Freq: Two times a day (BID) | ORAL | Status: DC
  Administered 2018-07-14 – 2018-07-18 (×8): 4 mg via ORAL
  Filled 2018-07-14 (×8): qty 1

## 2018-07-14 MED ORDER — MORPHINE SULFATE 4 MG/ML IJ SOLN
1.0000 mg | Freq: Once | INTRAMUSCULAR | Status: DC
  Filled 2018-07-14: qty 1

## 2018-07-14 MED ORDER — KCL IN DEXTROSE-NACL 20-5-0.9 MEQ/L-%-% IV SOLN
INTRAVENOUS | Status: DC
  Administered 2018-07-14 – 2018-07-15 (×2): via INTRAVENOUS
  Filled 2018-07-14 (×2): qty 1000

## 2018-07-14 MED ORDER — ONDANSETRON HCL 4 MG PO TABS: 4.0000 mg | ORAL_TABLET | Freq: Four times a day (QID) | ORAL | Status: DC | PRN

## 2018-07-14 MED ORDER — MORPHINE SULFATE 4 MG/ML IJ SOLN
2.0000 mg | Freq: Once | INTRAMUSCULAR | Status: AC
Start: 2018-07-14 — End: 2018-07-14
  Administered 2018-07-14: 2 mg via INTRAMUSCULAR
  Filled 2018-07-14: qty 1

## 2018-07-14 MED ORDER — LORAZEPAM 1 MG PO TABS: 1.0000 mg | ORAL_TABLET | Freq: Once | ORAL | Status: DC

## 2018-07-14 MED ORDER — PANTOPRAZOLE SODIUM 40 MG PO TBEC
40.0000 mg | DELAYED_RELEASE_TABLET | Freq: Every day | ORAL | Status: DC
  Administered 2018-07-14 – 2018-07-18 (×5): 40 mg via ORAL
  Filled 2018-07-14 (×4): qty 1

## 2018-07-14 MED ORDER — INSULIN GLARGINE 100 UNIT/ML ~~LOC~~ SOLN
12.0000 [IU] | Freq: Every day | SUBCUTANEOUS | Status: DC
  Administered 2018-07-14 – 2018-07-17 (×4): 12 [IU] via SUBCUTANEOUS
  Filled 2018-07-14 (×5): qty 0.12

## 2018-07-14 MED ORDER — TAMSULOSIN HCL 0.4 MG PO CAPS
0.4000 mg | ORAL_CAPSULE | Freq: Every day | ORAL | Status: DC
  Administered 2018-07-15 – 2018-07-18 (×4): 0.4 mg via ORAL
  Filled 2018-07-14 (×4): qty 1

## 2018-07-14 MED ORDER — INSULIN ASPART 100 UNIT/ML ~~LOC~~ SOLN
0.0000 [IU] | Freq: Three times a day (TID) | SUBCUTANEOUS | Status: DC
  Administered 2018-07-14: 2 [IU] via SUBCUTANEOUS
  Administered 2018-07-15: 5 [IU] via SUBCUTANEOUS
  Administered 2018-07-15: 3 [IU] via SUBCUTANEOUS
  Administered 2018-07-15: 5 [IU] via SUBCUTANEOUS
  Administered 2018-07-16 (×3): 3 [IU] via SUBCUTANEOUS
  Administered 2018-07-17: 5 [IU] via SUBCUTANEOUS
  Administered 2018-07-17: 3 [IU] via SUBCUTANEOUS
  Administered 2018-07-17: 2 [IU] via SUBCUTANEOUS

## 2018-07-14 MED ORDER — MIRTAZAPINE 7.5 MG PO TABS: 7.5000 mg | ORAL_TABLET | Freq: Every day | ORAL | 2 refills | Status: DC

## 2018-07-14 MED ORDER — GABAPENTIN 300 MG PO CAPS
300.0000 mg | ORAL_CAPSULE | Freq: Two times a day (BID) | ORAL | Status: DC
  Administered 2018-07-15 – 2018-07-18 (×7): 300 mg via ORAL
  Filled 2018-07-14 (×7): qty 1

## 2018-07-14 MED ORDER — INSULIN ASPART 100 UNIT/ML ~~LOC~~ SOLN
0.0000 [IU] | Freq: Every day | SUBCUTANEOUS | Status: DC
  Administered 2018-07-15: 3 [IU] via SUBCUTANEOUS
  Administered 2018-07-16: 2 [IU] via SUBCUTANEOUS

## 2018-07-14 MED ORDER — METHOCARBAMOL 500 MG PO TABS: 500.0000 mg | ORAL_TABLET | Freq: Three times a day (TID) | ORAL | Status: DC | PRN

## 2018-07-14 MED ORDER — MIRTAZAPINE 15 MG PO TABS
7.5000 mg | ORAL_TABLET | Freq: Every day | ORAL | Status: DC
  Administered 2018-07-14: 7.5 mg via ORAL
  Filled 2018-07-14: qty 1

## 2018-07-14 MED ORDER — ATORVASTATIN CALCIUM 10 MG PO TABS
10.0000 mg | ORAL_TABLET | Freq: Every day | ORAL | Status: DC
  Administered 2018-07-14 – 2018-07-18 (×5): 10 mg via ORAL
  Filled 2018-07-14 (×4): qty 1

## 2018-07-14 MED ORDER — SODIUM CHLORIDE 0.9 % IV SOLN: Freq: Once | INTRAVENOUS | Status: DC

## 2018-07-14 MED ORDER — ATROPINE SULFATE 1 MG/ML IJ SOLN
INTRAMUSCULAR | Status: AC
  Filled 2018-07-14: qty 1

## 2018-07-14 MED ORDER — ONDANSETRON HCL 4 MG/2ML IJ SOLN
INTRAMUSCULAR | Status: AC
  Filled 2018-07-14: qty 4

## 2018-07-14 MED ORDER — MORPHINE SULFATE ER 30 MG PO TBCR
30.0000 mg | EXTENDED_RELEASE_TABLET | Freq: Three times a day (TID) | ORAL | Status: DC
  Administered 2018-07-14: 30 mg via ORAL
  Filled 2018-07-14: qty 1

## 2018-07-14 MED ORDER — ATROPINE SULFATE 1 MG/ML IJ SOLN: 0.4000 mg | Freq: Once | INTRAMUSCULAR | Status: DC

## 2018-07-14 MED ORDER — ONDANSETRON HCL 4 MG/2ML IJ SOLN: 8.0000 mg | Freq: Once | INTRAMUSCULAR | Status: DC

## 2018-07-14 MED ORDER — NITROGLYCERIN 0.4 MG SL SUBL: 0.4000 mg | SUBLINGUAL_TABLET | SUBLINGUAL | Status: DC | PRN

## 2018-07-14 MED ORDER — MORPHINE SULFATE (PF) 4 MG/ML IV SOLN
INTRAVENOUS | Status: AC
  Filled 2018-07-14: qty 1

## 2018-07-14 MED ORDER — MORPHINE SULFATE (PF) 2 MG/ML IV SOLN: 2.0000 mg | INTRAVENOUS | Status: DC | PRN

## 2018-07-14 MED ORDER — SODIUM CHLORIDE 0.9 % IV SOLN: INTRAVENOUS | Status: DC

## 2018-07-14 MED ORDER — ACETAMINOPHEN 500 MG PO TABS
500.0000 mg | ORAL_TABLET | Freq: Four times a day (QID) | ORAL | Status: DC | PRN
  Administered 2018-07-16: 500 mg via ORAL
  Filled 2018-07-14: qty 1

## 2018-07-14 MED ORDER — GABAPENTIN 300 MG PO CAPS
300.0000 mg | ORAL_CAPSULE | Freq: Every day | ORAL | Status: DC
  Administered 2018-07-14 – 2018-07-17 (×4): 300 mg via ORAL
  Filled 2018-07-14 (×2): qty 1
  Filled 2018-07-14: qty 2
  Filled 2018-07-14: qty 1

## 2018-07-14 MED ORDER — BOOST / RESOURCE BREEZE PO LIQD CUSTOM
1.0000 | Freq: Three times a day (TID) | ORAL | Status: DC
  Administered 2018-07-14: 1 via ORAL

## 2018-07-14 MED ORDER — SODIUM CHLORIDE 0.9 % IV BOLUS
1000.0000 mL | Freq: Once | INTRAVENOUS | Status: AC
Start: 2018-07-14 — End: 2018-07-14
  Administered 2018-07-14: 1000 mL via INTRAVENOUS

## 2018-07-14 MED ORDER — MORPHINE SULFATE (PF) 2 MG/ML IV SOLN
2.0000 mg | INTRAVENOUS | Status: DC | PRN
  Administered 2018-07-14 – 2018-07-16 (×2): 2 mg via INTRAVENOUS
  Filled 2018-07-14 (×2): qty 1

## 2018-07-14 NOTE — Progress Notes (Signed)
.  [facility] Progress Note   Allen Glenn [id number] 22-Jun-1956 62 y.o.  Allen Glenn is managed by Dr. Dominica Severin B. Sherrill  Actively treated with chemotherapy/immunotherapy: no  Current Therapy: Radiation therapy (status post fraction 11)  Last Treated: 07/14/2018  Assessment: Plan:    Non-intractable vomiting with nausea, unspecified vomiting type - Plan: LORazepam (ATIVAN) tablet 1 mg, ondansetron (ZOFRAN-ODT) disintegrating tablet 4 mg, DISCONTINUED: LORazepam (ATIVAN) tablet 0.5 mg, DISCONTINUED: ondansetron (ZOFRAN) 8 mg in sodium chloride 0.9 % 50 mL IVPB, DISCONTINUED: ondansetron (ZOFRAN) injection 8 mg  Diarrhea, unspecified type - Plan: atropine injection 0.4 mg, DISCONTINUED: ondansetron (ZOFRAN) injection 8 mg  Neoplasm related pain - Plan: morphine 4 MG/ML injection 2 mg, DISCONTINUED: morphine 4 MG/ML injection 1 mg, DISCONTINUED: ondansetron (ZOFRAN) injection 8 mg  Dehydration - Plan: 0.9 %  sodium chloride infusion, DISCONTINUED: ondansetron (ZOFRAN) injection 8 mg   Nausea and vomiting: The patient was give Zofran ODT 4 mg sublingual and Ativan 1 mg sublingual.  Diarrhea: Plans had been to give the patient atropine 4 mg IV today however we were unable to obtain IV access.  Neoplasm related pain: The patient was given morphine sulfate 2 mg IM x1.  Dehydration: The patient was admitted for placement of a PICC line and for IV hydration given the fact that 4 attempts were made for IV placement today and were unsuccessful.  A call was placed to interventional radiology to inquire if his port placement which is scheduled for 07/21/2018 could be moved up.   Please see After Visit Summary for patient specific instructions.  Future Appointments  Date Time Provider [place]  07/15/2018  8:15 AM Ladell Pier, MD [facility] None  07/21/2018  7:30 AM [facility] ROOM [facility] None  07/21/2018  9:30 AM [facility] 1 [facility] [place]  07/22/2018  7:45 AM  [facility] LAB 3 [facility] None  07/22/2018  8:00 AM Ladell Pier, MD [facility] None  07/22/2018  8:30 AM [facility] INFUSION [facility] None  09/10/2018  9:30 AM Hayden Pedro, PA-C [facility] None  09/23/2018  8:40 AM [name], MD [facility]    No orders of the defined types were placed in this encounter.      Subjective:   Patient ID:  Allen Glenn is a 62 y.o. (DOB 1956/05/15) male.  Chief Complaint:  Chief Complaint  Patient presents with  . Nausea    HPI Allen Glenn is a 62 year old male with a history of a metastatic colon cancer with brain metastasis status post radiation therapy.  He also had a resection of a left cerebellar metastasis.  He has recently been found to have thoracic spine metastasis and is undergoing radiation therapy.  He is completed 11 fractions of radiation therapy.  He presents to the clinic today with his wife, son, and daughter.  He has been having nausea and vomiting along with diarrhea and anorexia.  His nausea, vomiting, diarrhea have occurred for the last day.  His anorexia has been progressive since last Wednesday.  He has been tapered on his Decadron and is currently on 2 mg twice daily.  His wife did go back on his dose yesterday to 4 mg in the evening given the fact that he was having progressive anorexia.  The patient is afebrile and normotensive.  Medications: I have reviewed the patient's current medications.  Allergies: No Known Allergies  Past Medical History:  Diagnosis Date  . Acute MI ([place]) 2014   acute  ST elevation MI  . Anemia   . Colon cancer ([place]) 2016   Status post resection of colon mass is well as he panic metastasis.   . Coronary artery disease   . Diabetes mellitus without complication ([place])   . Enlarged prostate    slightly and takes Flomax daily  . Heart disease   . History of blood transfusion   . Hyperlipidemia    takes Lipitor daily  . Hypertension    takes Lisinopril daily    . Lung nodule    left  . Nocturia   . Numbness    left foot  . Pancreatitis   . Peripheral neuropathy     Past Surgical History:  Procedure Laterality Date  . 1/8 of liver removed    . APPENDECTOMY    . APPLICATION OF CRANIAL NAVIGATION Left 06/04/2018   Procedure: APPLICATION OF CRANIAL NAVIGATION;  Surgeon: Erline Levine, MD;  Location: [facility];  Service: Neurosurgery;  Laterality: Left;  . BIOPSY  05/22/2018   Procedure: BIOPSY;  Surgeon: Carol Ada, MD;  [facility];  Service: Endoscopy;;  . BRAIN SURGERY  06/04/2018  . COLONOSCOPY    . CORONARY ANGIOPLASTY     1 stent  . CORONARY STENT PLACEMENT  06-27-2013  . CRANIOTOMY Left 06/04/2018   Procedure: CRANIOTOMY TUMOR EXCISION with Brainlab;  Surgeon: Erline Levine, MD;  Location: [place] [facility];  Service: Neurosurgery;  Laterality: Left;  CRANIOTOMY TUMOR EXCISION with Brainlab  . ESOPHAGOGASTRODUODENOSCOPY Left 05/22/2018   Procedure: ESOPHAGOGASTRODUODENOSCOPY (EGD);  Surgeon: Carol Ada, MD;  Location: [facility];  Service: Endoscopy;  Laterality: Left;  . HERNIA REPAIR Left 1991  . LAPAROSCOPIC RIGHT COLECTOMY  2016   [place]  . LIVER LOBECTOMY Right 09/28/2015    Right partial hepatectomy at [facility]  . LUNG BIOPSY Left 11/28/2016   Procedure: LUNG BIOPSY, left upper lobe;  Surgeon: [name], MD;  Location: [place];  Service: Thoracic;  Laterality: Left;  Marland Kitchen VIDEO BRONCHOSCOPY WITH ENDOBRONCHIAL NAVIGATION N/A 11/28/2016   Procedure: VIDEO BRONCHOSCOPY WITH ENDOBRONCHIAL NAVIGATION;  Surgeon: [name], MD;  Location: [facility] OR;  Service: Thoracic;  Laterality: N/A;  . VIDEO BRONCHOSCOPY WITH ENDOBRONCHIAL ULTRASOUND N/A 11/28/2016   Procedure: VIDEO BRONCHOSCOPY WITH ENDOBRONCHIAL ULTRASOUND;  Surgeon: [name], MD;  [facility];  Service: Thoracic;  Laterality: N/A;    Family History  Problem Relation Age of Onset  . Hypertension Mother   . Heart disease Mother   . Hypertension Father   .  Heart disease Father   . Neuropathy Neg Hx     Social History   Socioeconomic History  . Marital status: Married    Spouse name: [name]  . Number of children: 2  . Years of education: Bachelor  . Highest education level: Not on file  Occupational History  . Occupation: Company secretary  Social Needs  . Financial resource strain: Not on file  . Food insecurity:    Worry: Not on file    Inability: Not on file  . Transportation needs:    Medical: Not on file    Non-medical: Not on file  Tobacco Use  . Smoking status: Never Smoker  . Smokeless tobacco: Never Used  Substance and Sexual Activity  . Alcohol use: No    Alcohol/week: 0.0 oz  . Drug use: No  . Sexual activity: Not on file  Lifestyle  . Physical activity:    Days per week: Not on file    Minutes per session:  Not on file  . Stress: Not on file  Relationships  . Social connections:    Talks on phone: Not on file    Gets together: Not on file    Attends religious service: Not on file    Active member of club or organization: Not on file    Attends meetings of clubs or organizations: Not on file    Relationship status: Not on file  . Intimate partner violence:    Fear of current or ex partner: Not on file    Emotionally abused: Not on file    Physically abused: Not on file    Forced sexual activity: Not on file  Other Topics Concern  . Not on file  Social History Narrative   Live at home with wife, [name]   Patient is a [employer] manager, active and goes to gym   Caffeine: very little soda, occasional tea. 12oz-16oz/per    Past Medical History, Surgical history, Social history, and Family history were reviewed and updated as appropriate.   Please see review of systems for further details on the patient's review from today.   Review of Systems:  Review of Systems  Constitutional: Positive for appetite change. Negative for chills, diaphoresis and fever.  Respiratory: Negative for cough, choking, shortness of breath  and wheezing.   Cardiovascular: Negative for chest pain and palpitations.  Gastrointestinal: Positive for diarrhea, nausea and vomiting. Negative for constipation.  Genitourinary: Negative for decreased urine volume.  Musculoskeletal: Positive for back pain.  Neurological: Negative for headaches.    Objective:   Physical Exam:  BP 127/72 (BP Location: Left Arm, Patient Position: Sitting)   Pulse (!) 115   Temp 97.8 F (36.6 C) (Oral)   Resp 20   Ht 6\' 2"  (1.88 m)   SpO2 100%   BMI 24.57 kg/m  ECOG: 1  Physical Exam  Constitutional:  The patient is an adult male who appears to be chronically ill and in a moderate amount of discomfort.  HENT:  Head: Normocephalic and atraumatic.  Cardiovascular: Regular rhythm, S1 normal and S2 normal. Tachycardia present. Exam reveals no friction rub.  No murmur heard. Pulmonary/Chest: Effort normal and breath sounds normal. No stridor. No respiratory distress. He has no wheezes. He has no rales.  Abdominal: Soft. He exhibits no distension and no mass. Bowel sounds are decreased. There is no tenderness. There is no rebound and no guarding.  Neurological: He is alert.  Skin: Skin is warm and dry. He is not diaphoretic.    Lab Review:     Component Value Date/Time   NA 127 (L) 07/14/2018 1033   NA 137 11/19/2017 0822   K 4.6 07/14/2018 1033   K 4.2 11/19/2017 0822   CL 97 (L) 07/14/2018 1033   CO2 21 (L) 07/14/2018 1033   CO2 24 11/19/2017 0822   GLUCOSE 185 (H) 07/14/2018 1033   GLUCOSE 189 (H) 11/19/2017 0822   BUN 16 07/14/2018 1033   BUN 13.0 11/19/2017 0822   CREATININE 0.58 (L) 07/14/2018 1033   CREATININE 0.8 11/19/2017 0822   CALCIUM 8.3 (L) 07/14/2018 1033   CALCIUM 8.9 11/19/2017 0822   PROT 5.4 (L) 07/14/2018 1033   PROT 7.1 11/19/2017 0822   ALBUMIN 2.0 (L) 07/14/2018 1033   ALBUMIN 4.1 11/19/2017 0822   AST 15 07/14/2018 1033   AST 34 11/19/2017 0822   ALT 30 07/14/2018 1033   ALT 58 (H) 11/19/2017 0822   ALKPHOS  247 (H) 07/14/2018 1033   ALKPHOS  94 11/19/2017 0822   BILITOT 0.4 07/14/2018 1033   BILITOT 0.42 11/19/2017 0822   GFRNONAA >60 07/14/2018 1033   GFRAA >60 07/14/2018 1033       Component Value Date/Time   WBC 3.0 (L) 07/14/2018 1033   WBC 21.0 (H) 06/05/2018 1350   RBC 5.00 07/14/2018 1033   HGB 14.8 07/14/2018 1033   HCT 43.2 07/14/2018 1033   PLT 108 (L) 07/14/2018 1033   MCV 86.4 07/14/2018 1033   MCH 29.6 07/14/2018 1033   MCHC 34.3 07/14/2018 1033   RDW 15.1 (H) 07/14/2018 1033   LYMPHSABS 0.5 (L) 07/14/2018 1033   MONOABS 0.4 07/14/2018 1033   EOSABS 0.1 07/14/2018 1033   BASOSABS 0.0 07/14/2018 1033   -------------------------------  Imaging from last 24 hours (if applicable):  Radiology interpretation: Mr Cervical Spine W Wo Contrast  Result Date: 07/04/2018 CLINICAL DATA:  Metastatic colon cancer.  Staging. EXAM: MRI CERVICAL SPINE WITHOUT AND WITH CONTRAST TECHNIQUE: Multiplanar and multiecho pulse sequences of the cervical spine, to include the craniocervical junction and cervicothoracic junction, were obtained without and with intravenous contrast. CONTRAST:  59mL MULTIHANCE GADOBENATE DIMEGLUMINE 529 MG/ML IV SOLN COMPARISON:  No previous cervical imaging. FINDINGS: Alignment: Normal Vertebrae: Small foci of metastatic disease are evident in the posterior elements on the right at C5, posterior elements on the left at C4, within the superior posterior vertebral body at C3 and within the left inferior corner of the C6 vertebral body. No advanced cervical disease. No extraosseous disease in the cervical region. Cord: No cervical cord metastasis. Posterior Fossa, vertebral arteries, paraspinal tissues: Negative Disc levels: No degenerative change in the cervical region. IMPRESSION: Small cervical metastases, in the posterosuperior corner of the C3 vertebral body, the left inferior corner of the C6 vertebral body, the posterior elements on the right at C5 in the posterior  elements on the left at C4. No extraosseous tumor. Electronically Signed   By: Nelson Chimes M.D.   On: 07/04/2018 17:03   Mr Thoracic Spine W Wo Contrast  Result Date: 07/04/2018 CLINICAL DATA:  Staging for colon cancer.  Back pain. EXAM: MRI THORACIC WITHOUT AND WITH CONTRAST TECHNIQUE: Multiplanar and multiecho pulse sequences of the thoracic spine were obtained without and with intravenous contrast. CONTRAST:  69mL MULTIHANCE GADOBENATE DIMEGLUMINE 529 MG/ML IV SOLN COMPARISON:  CT 02/25/2018. Lumbar MRI 06/18/2018. Cervical exam same day. FINDINGS: MRI THORACIC SPINE FINDINGS Alignment:  Slightly increased kyphotic curvature. Vertebrae: Extensive regional metastatic disease. 1 cm metastasis within the anterior T1 vertebral body. T2 is negative. T3 shows a small metastasis at the left inferior endplate. T4 shows replacement of the vertebral body by tumor. There is a tumor mass bulging into the posterior mediastinum on the right. No canal compromise. T5 shows tumor throughout the vertebral body, extending into the posterior elements on the left. Tumor bulges into the ventral epidural space but there is no cord compression or foraminal compression. T6 shows near complete marrow replacement in the right side of the vertebral body by tumor. Tumor bulges into the ventral epidural space but does not cause frank cord compression. T7 is negative. T8 is negative. T9 is negative except for involvement of the right transverse process. No encroachment upon the neural structures. T10 shows punctate metastases within the vertebral body. T11 shows a punctate metastasis within the inferior vertebral body. T12 shows a 1 cm metastasis within the upper vertebral body without extraosseous extension. Scattered vertebral body metastases at L1 without extraosseous tumor. Cord: No primary cord  metastasis or cord compression. As noted above, there is early epidural tumor involvement at T5 and T6. Paraspinal and other soft tissues: As  noted above, there is right-sided posterior mediastinal paravertebral tumor adjacent to the T4 and T5 vertebral bodies. Early right paravertebral involvement at T6. Disc levels: No significant degenerative disc disease. IMPRESSION: Widespread metastatic disease throughout the thoracic region as outlined above. Disease is most pronounced at T4, T5 and T6 where there is extensive vertebral body marrow replacement by tumor. There is early ventral epidural tumor at T5 and T6 but no cord compression at this moment. No neural foraminal involvement. There is paravertebral tumor in the posterior mediastinum on the right at T4 and T5 and to an early extent at T6. Electronically Signed   By: Nelson Chimes M.D.   On: 07/04/2018 16:49   Dg Hip Unilat W Or W/o Pelvis 1 View Left  Result Date: 06/18/2018 CLINICAL DATA:  History of colonic malignancy. Patient is complaining of constant low back pain radiating to the left hip for the past week. No known injury. EXAM: DG HIP (WITH OR WITHOUT PELVIS) 1V*L* COMPARISON:  Coronal and sagittal reconstructed images through the pelvis and left hip from a CT scan dated May 15, 2018 FINDINGS: The bones are subjectively adequately mineralized. There is no acute fracture nor lytic or blastic lesion. AP and lateral views of the left hip reveal mild symmetric narrowing of the joint space. The articular surfaces of the femoral head and acetabulum remains smoothly rounded. The femoral neck, intertrochanteric, and subtrochanteric regions are normal. IMPRESSION: There is no acute bony abnormality of the pelvis or left hip. There is mild symmetric left hip joint space loss consistent with osteoarthritis. Electronically Signed   By: David  Martinique M.D.   On: 06/18/2018 14:54   [name] Ekg Site Rite  Result Date: 07/14/2018 If Site Rite image not attached, placement could not be confirmed due to current cardiac rhythm.

## 2018-07-14 NOTE — Progress Notes (Signed)
PHARMACIST - PHYSICIAN ORDER COMMUNICATION  CONCERNING: P&T Medication Policy on Herbal Medications  DESCRIPTION:  This patient's order for:  COQ-10  has been noted.  This product(s) is classified as an "herbal" or natural product. Due to a lack of definitive safety studies or FDA approval, nonstandard manufacturing practices, plus the potential risk of unknown drug-drug interactions while on inpatient medications, the Pharmacy and Therapeutics Committee does not permit the use of "herbal" or natural products of this type within [place].   ACTION TAKEN: The pharmacy department is unable to verify this order at this time and your patient has been informed of this safety policy. Please reevaluate patient's clinical condition at discharge and address if the herbal or natural product(s) should be resumed at that time.  

## 2018-07-14 NOTE — H&P (Signed)
History and Physical    Allen Glenn KXF:[id number] DOB: 07/09/1956 DOA: 07/14/2018  PCP: [name], MD   Patient coming from: Home via [place]  Chief Complaint: Nausea, Poor Po Intake, Diarrhea; Generalized Weakness  HPI: Allen Glenn is a 62 y.o. male with medical history significant of hypertension, diabetes mellitus type 2, BPH, history of diastolic CHF, hyperlipidemia, CAD, metastatic colon cancer with brain metastasis as well as thoracic disease who went to his oncologist today for follow-up and was feeling poorly for the last week.  Symptoms started last Wednesday and patient started progressively becoming more fatigued and having a poor p.o. intake.  Saturday symptoms worsened and he started having some nausea vomiting and yesterday he had some diarrhea.  He has not been eating properly.  Oncology evaluated patient and will concern for dehydration however a IV line was unable to be placed in the office and the IV team tried 3 times and was unsuccessful given the ultrasound.  TRH was called for direct admission so patient will be admitted for nausea, vomiting, diarrhea in the setting of dehydration from poor p.o. intake and possible infectious causes and will have PICC Line placed.  Denied any chest pain, shortness of breath, lightheadedness or dizziness.  Wife states that he has general malaise and has not been wanting to eat.  Will notify Oncology of admission.  ED Course: No ED course as the patient was a direct admission  Review of Systems: As per HPI otherwise 10 point review of systems negative.   Past Medical History:  Diagnosis Date  . Acute MI ([place]) 2014   acute ST elevation MI  . Anemia   . Colon cancer ([place]) 2016   Status post resection of colon mass is well as he panic metastasis.   . Coronary artery disease   . Diabetes mellitus without complication ([place])   . Enlarged prostate    slightly and takes Flomax daily  . Heart disease   . History of blood  transfusion   . Hyperlipidemia    takes Lipitor daily  . Hypertension    takes Lisinopril daily  . Lung nodule    left  . Nocturia   . Numbness    left foot  . Pancreatitis   . Peripheral neuropathy    Past Surgical History:  Procedure Laterality Date  . 1/8 of liver removed    . APPENDECTOMY    . APPLICATION OF CRANIAL NAVIGATION Left 06/04/2018   Procedure: APPLICATION OF CRANIAL NAVIGATION;  Surgeon: Erline Levine, MD;  Location: [facility];  Service: Neurosurgery;  Laterality: Left;  . BIOPSY  05/22/2018   Procedure: BIOPSY;  Surgeon: Carol Ada, MD;  Location: [facility];  Service: Endoscopy;;  . BRAIN SURGERY  06/04/2018  . COLONOSCOPY    . CORONARY ANGIOPLASTY     1 stent  . CORONARY STENT PLACEMENT  06-27-2013  . CRANIOTOMY Left 06/04/2018   Procedure: CRANIOTOMY TUMOR EXCISION with Brainlab;  Surgeon: Erline Levine, MD;  Location: [facility];  Service: Neurosurgery;  Laterality: Left;  CRANIOTOMY TUMOR EXCISION with Brainlab  . ESOPHAGOGASTRODUODENOSCOPY Left 05/22/2018   Procedure: ESOPHAGOGASTRODUODENOSCOPY (EGD);  Surgeon: Carol Ada, MD;  Location: [facility];  Service: Endoscopy;  Laterality: Left;  . HERNIA REPAIR Left 1991  . LAPAROSCOPIC RIGHT COLECTOMY  2016   [place]  . LIVER LOBECTOMY Right 09/28/2015    Right partial hepate[facility] at [facility]  . LUNG BIOPSY Left 11/28/2016   Procedure: LUNG BIOPSY, left upper lobe;  Surgeon: Percell Miller  Maryruth Bun, MD;  Location: [facility];  Service: Thoracic;  Laterality: Left;  Marland Kitchen VIDEO BRONCHOSCOPY WITH ENDOBRONCHIAL NAVIGATION N/A 11/28/2016   Procedure: VIDEO BRONCHOSCOPY WITH ENDOBRONCHIAL NAVIGATION;  Surgeon: [name], MD;  Location: [facility];  Service: Thoracic;  Laterality: N/A;  . VIDEO BRONCHOSCOPY WITH ENDOBRONCHIAL ULTRASOUND N/A 11/28/2016   Procedure: VIDEO BRONCHOSCOPY WITH ENDOBRONCHIAL ULTRASOUND;  Surgeon: [name], MD;  Location: [facility];  Service: Thoracic;  Laterality: N/A;   SOCIAL HISTORY  reports that  he has never smoked. He has never used smokeless tobacco. He reports that he does not drink alcohol or use drugs.  ALLERGIES No Known Allergies  Family History  Problem Relation Age of Onset  . Hypertension Mother   . Heart disease Mother   . Hypertension Father   . Heart disease Father   . Neuropathy Neg Hx    Prior to Admission medications   Medication Sig Start Date End Date Taking? Authorizing Provider  acetaminophen (TYLENOL) 500 MG tablet Take 500 mg by mouth every 6 (six) hours as needed (FOR PAIN/HEADACHES.).    Yes [provider]  atorvastatin (LIPITOR) 10 MG tablet Take 10 mg by mouth daily.    Yes [provider]  clotrimazole (MYCELEX) 10 MG troche Take 1 tablet (10 mg total) by mouth 4 (four) times daily. Take while on decadron 06/11/18  Yes Ladell Pier, MD  Coenzyme Q10 (COQ10) 200 MG CAPS Take 200 mg by mouth daily.   Yes [provider]  dexamethasone (DECADRON) 4 MG tablet Take 1 tablet (4 mg total) by mouth daily. 06/21/18  Yes Hayden Pedro, PA-C  gabapentin (NEURONTIN) 300 MG capsule Take 1 capsule (300 mg total) by mouth 3 (three) times daily. 1 to 2 PO QHS 06/25/18  Yes Hayden Pedro, PA-C  HUMALOG KWIKPEN 100 UNIT/ML KiwkPen Inject 2-15 Units into the skin See admin instructions. 121-150=2 UNITS, 151-200=3 UNITS, 201-250=5 UNITS, 251-300=8 UNITS, 301-350=11 UNITS, 351-400=15 UNITS 05/26/18  Yes [provider]  Insulin Glargine (BASAGLAR KWIKPEN) 100 UNIT/ML SOPN Inject 0.12 mLs (12 Units total) into the skin at bedtime. 05/26/18  Yes Mikhail, Velta Addison, DO  lisinopril (PRINIVIL,ZESTRIL) 2.5 MG tablet Take 2.5 mg by mouth daily. 02/16/15  Yes [provider]  methocarbamol (ROBAXIN) 500 MG tablet Take 500 mg by mouth every 8 (eight) hours as needed. 07/08/18  Yes [provider]  morphine (MS CONTIN) 30 MG 12 hr tablet Take 1 tablet (30 mg total) by mouth 3 (three) times daily. 07/02/18  Yes Ladell Pier, MD  morphine (MSIR) 15 MG tablet Take 1 tablet (15 mg total) by mouth every 4 (four) hours as needed for severe pain. 07/02/18  Yes Ladell Pier, MD  nitroGLYCERIN (NITROSTAT) 0.4 MG SL tablet Place 0.4 mg under the tongue every 5 (five) minutes as needed for chest pain. CALL 911 IF PAIN PERSISTS 11/24/14  Yes [provider]  omeprazole (PRILOSEC) 20 MG capsule Take 20 mg by mouth daily.   Yes [provider]  ondansetron (ZOFRAN-ODT) 4 MG disintegrating tablet Take 1 tablet (4 mg total) by mouth every 8 (eight) hours as needed for nausea or vomiting. 05/26/18  Yes Mikhail, Velta Addison, DO  tamsulosin (FLOMAX) 0.4 MG CAPS capsule Take 0.4 mg by mouth daily after breakfast. 30 minutes after breakfast 02/24/15  Yes [provider]  trolamine salicylate (ASPERCREME) 10 % cream Apply 1 application topically 2 (two) times daily as needed (FOR BACK PAIN.).   Yes  [provider]  Continuous Blood Gluc Sensor (FREESTYLE LIBRE 14 DAY SENSOR) MISC 1 application by Does not apply route every 14 (fourteen) days. 06/06/18   Mikhail, Velta Addison, DO  Insulin Pen Needle 31G X 5 MM MISC Use with insulin pen. 05/26/18   Mikhail, Velta Addison, DO  methocarbamol (ROBAXIN) 750 MG tablet Take 1 tablet (750 mg total) by mouth every 8 (eight) hours as needed for muscle spasms. Patient not taking: Reported on 07/14/2018 05/26/18   Cristal Ford, DO  mirtazapine (REMERON) 7.5 MG tablet Take 1 tablet (7.5 mg total) by mouth at bedtime. 07/14/18   Tanner, Lyndon Code., PA-C  zolpidem (AMBIEN) 5 MG tablet Take 1 tablet (5 mg total) by mouth at bedtime as needed for sleep. Patient not taking: Reported on 07/14/2018 05/26/18   Cristal Ford, DO   Physical Exam: Vitals:   07/14/18 1538  BP: 131/84  Pulse: (!) 104  Temp: 98.1 F (36.7 C)  TempSrc: Oral  SpO2: 99%   Constitutional: Chronically ill appearing Caucasian male in NAD and appears calm and resting  Eyes:  Lids and conjunctivae normal, sclerae  anicteric  ENMT: External Ears, Nose appear normal. Grossly normal hearing. Mucous membranes are dry Neck: Appears normal, supple, no cervical masses, normal ROM, no appreciable thyromegaly, no JVD Respiratory: Diminished to auscultation bilaterally, no wheezing, rales, rhonchi or crackles. Normal respiratory effort and patient is not tachypenic. No accessory muscle use.  Cardiovascular: Tachycardic Rate but regular rhythm, no murmurs / rubs / gallops. S1 and S2 auscultated. No extremity edema.  Abdomen: Soft, Tender, Slightly distended. No masses palpated. No appreciable hepatosplenomegaly. Bowel sounds positive x4.  GU: Deferred. Musculoskeletal: No clubbing / cyanosis of digits/nails. No joint deformity upper and lower extremities.  Skin: No rashes, lesions, ulcers on a limited skin eval. No induration; Warm and dry.  Neurologic: CN 2-12 grossly intact with no focal deficits. Romberg sign and cerebellar reflexes not assessed.  Psychiatric: Normal judgment and insight. Alert and oriented x 3. Normal mood and appropriate affect.   Labs on Admission: I have personally reviewed following labs and imaging studies  CBC: Recent Labs  Lab 07/14/18 1033  WBC 3.0*  NEUTROABS 2.0  HGB 14.8  HCT 43.2  MCV 86.4  PLT 425*   Basic Metabolic Panel: Recent Labs  Lab 07/14/18 1033  NA 127*  K 4.6  CL 97*  CO2 21*  GLUCOSE 185*  BUN 16  CREATININE 0.58*  CALCIUM 8.3*  MG 2.1   GFR: Estimated Creatinine Clearance: 112.7 mL/min (A) (by C-G formula based on SCr of 0.58 mg/dL (L)). Liver Function Tests: Recent Labs  Lab 07/14/18 1033  AST 15  ALT 30  ALKPHOS 247*  BILITOT 0.4  PROT 5.4*  ALBUMIN 2.0*   No results for input(s): LIPASE, AMYLASE in the last 168 hours. No results for input(s): AMMONIA in the last 168 hours. Coagulation Profile: No results for input(s): INR, PROTIME in the last 168 hours. Cardiac Enzymes: No results for input(s): CKTOTAL, CKMB, CKMBINDEX, TROPONINI in  the last 168 hours. BNP (last 3 results) No results for input(s): PROBNP in the last 8760 hours. HbA1C: No results for input(s): HGBA1C in the last 72 hours. CBG: Recent Labs  Lab 07/14/18 1657  GLUCAP 188*   Lipid Profile: No results for input(s): CHOL, HDL, LDLCALC, TRIG, CHOLHDL, LDLDIRECT in the last 72 hours. Thyroid Function Tests: No results for input(s): TSH, T4TOTAL, FREET4, T3FREE, THYROIDAB in the last 72 hours. Anemia Panel: No results for input(s): VITAMINB12,  FOLATE, FERRITIN, TIBC, IRON, RETICCTPCT in the last 72 hours. Urine analysis:    Component Value Date/Time   COLORURINE YELLOW 05/19/2018 1825   APPEARANCEUR CLEAR 05/19/2018 1825   LABSPEC >1.030 (H) 05/19/2018 1825   PHURINE 6.0 05/19/2018 1825   GLUCOSEU NEGATIVE 05/19/2018 1825   HGBUR NEGATIVE 05/19/2018 1825   BILIRUBINUR NEGATIVE 05/19/2018 1825   KETONESUR 15 (A) 05/19/2018 1825   PROTEINUR NEGATIVE 05/19/2018 1825   NITRITE NEGATIVE 05/19/2018 1825   LEUKOCYTESUR NEGATIVE 05/19/2018 1825   Sepsis Labs: !!!!!!!!!!!!!!!!!!!!!!!!!!!!!!!!!!!!!!!!!!!! @LABRCNTIP (procalcitonin:4,lacticidven:4) )No results found for this or any previous visit (from the past 240 hour(s)).   Radiological Exams on Admission: [name] Ekg Site Rite  Result Date: 07/14/2018 If Site Rite image not attached, placement could not be confirmed due to current cardiac rhythm.  EKG: No EKG as patient was a direct admission but will order one now.   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)   Anemia   Cancer related pain   Dehydration   Generalized weakness  Generalized Weakness with poor po Intake and Dehydration -Admit to Inpatient Telemetry  -Has no IV access will need a PICC line placement versus a midline catheter if PICC line cannot be placed tonight -Give Bolus of 1 Liter of NS and Start IV fluid hydration with D5 normal  saline + 40 mEQ of KCl at a rate of 100 mL/hr -Get PT/OT Evaluation -Clear Liquid Diet and Advance as Tolerate -R/o Infection and obtain Blood Cx and Urinalysis and Urine Cx; Check CXR as well     Dehydration from N/V/Diarrhea -As Above -Has had poor appetite for 1 week with Nausea and Vomiting on Saturday and then had One Diarrheal episode last night but after he was constipated. Has been taking Docusate   Hyponatremia/Hypochloremia -Patient's sodium on admission was 127 and Chloride was 97 -IVF Hydration as Above  CAD with history of STEMI -Continue atorvastatin 10 mg p.o. daily, and nitroglycerin 0.4 mg every 5 minutes as needed for chest pain -We will hold home lisinopril at this time  BPH -Continue with Tamsulosin 0.4 mg p.o. after breakfast  GERD -Continue with Pantoprazole 40 mg p.o. daily as a substitution for omeprazole 20 mg p.o. daily  Hypertension -Hold home Lisinopril 2.5 mg p.o. daily in the setting of dehydration  Hyperlipidemia -Continue with atorvastatin 10 mg p.o. Daily  Diabetes Mellitus Type 2 -Insulin-dependent.  Continue home Basaglar 12 units subcu daily at bedtime but hold NovoLog -Placed on sensitive NovoLog sliding scale insulin before meals and at bedtime -Continue to monitor CBGs -Check hemoglobin A1c -Continue to Monitor to CBG's; Ranging from 185-188  Metastatic colon cancer with brain metastasis as well as thoracic disease -Currently in the process of getting radiation treatment and will be set up for chemotherapy soon -Was supposed to have a port placement as an outpatient however needs IV access now and will have a PEG placed -Oncology was consulted for further evaluation recommendations -Continue with home Decadron 4 mg will increase to twice daily -Continue pain control with MS Contin 30 mg p.o. 3 times daily, morphine 1 tab p.o. every 4 PRN for severe pain; Add IV Morphine if unable to tolerate po  -Continue with Robaxin 5 mg p.o. q. 8 as  needed -CEA was 31.01 -Consult Oncology [name] and will follow Recommendations   Leukopenia and Thrombocytopenia -WBC went from 11.7 is now 3.0 -Platelet count  from 213 is now 108 -Check blood cultures, urine culture urinalysis, and chest x-ray -Continue to monitor for signs of bleeding -Repeat CBC in the AM.  Hx of Anemia -Likely Hemo-concentrated on Admission -Given Bolus of NS 1 Liter -Hb/Hct on Admission was 14.8/43.2  -Continue to Monitor for S/Sx of Bleeding -Repeat CBC in AM   Cancer Related Pain -Continue with home Decadron 4 mg will increase to twice daily -Continue pain control with MS Contin 30 mg p.o. 3 times daily, morphine 1 tab p.o. every 4 PRN for severe pain; Add IV Morphine if unable to tolerate po  -Continue with Robaxin 5 mg p.o. q. 8 as needed  DVT prophylaxis: Enoxaparin 40 mg sq q24h Code Status: FULL CODE Family Communication: Discussed with wife at bedside  Disposition Plan: Remain Inpatient and Anticipate D/C Home in next 48 hours Consults called: None Admission status: Inpatient Telemetry   Severity of Illness: The appropriate patient status for this patient is INPATIENT. Inpatient status is judged to be reasonable and necessary in order to provide the required intensity of service to ensure the patient's safety. The patient's presenting symptoms, physical exam findings, and initial radiographic and laboratory data in the context of their chronic comorbidities is felt to place them at high risk for further clinical deterioration. Furthermore, it is not anticipated that the patient will be medically stable for discharge from the hospital within 2 midnights of admission. The following factors support the patient status of inpatient.   " The patient's presenting symptoms include poor appetite, Nausea, Vomiting. " The worrisome physical exam findings include Withdrawn affect, Skin tending. " The initial radiographic and laboratory data are worrisome  because of of Dehydration. " The chronic co-morbidities include Metastatic Colon Cancer to the Brain; HTN, HLD, CAD.   * I certify that at the point of admission it is my clinical judgment that the patient will require inpatient hospital care spanning beyond 2 midnights from the point of admission due to high intensity of service, high risk for further deterioration and high frequency of surveillance required.Kerney Elbe, D.O. [facility] Pager [phone]  If 7PM-7AM, please contact night-coverage [email or web address] Password [facility]  07/14/2018, 5:48 PM

## 2018-07-14 NOTE — Progress Notes (Signed)
Patient scheduled for a PORT placement. However, order for PICC placement also. MD will evaluated in AM regarding appropriate vascular access at this time and how soon PORT can be placed. Fran Lowes, RN VAST

## 2018-07-14 NOTE — Progress Notes (Signed)
IV team unsure of if PICC would be placed today.  Family stating that they would go to different facility if picc not placed today.  IV team notified, will evaluate for midline catheter for the time being.

## 2018-07-14 NOTE — Progress Notes (Signed)
3 IV attempts on-site. IV team contacted and 2 additional attempts made for PIV. Spoke with wife and pt regarding potential for PICC. Per IV team nurse, 5 pts currently on the schedule for PICC placement today. Contact made by PA to IR to have port placement moved up. Pt wife verbalized concern regarding pt's deterioration. Pt to be direct admitted d/t fatigue, dehydration, nausea, pain, and leukopenia. Dr. Tamala Ser assigned hospitalist given report from Sandi Mealy, PA. Rm 1501 assigned to patient. Report given to Shanon Brow, RN on 5E. Pt taken to admitting and transferred to 1501. Shanon Brow, RN in room to transition pt.

## 2018-07-15 ENCOUNTER — Ambulatory Visit: Payer: 59 | Admitting: Oncology

## 2018-07-15 DIAGNOSIS — I10 Essential (primary) hypertension: Secondary | ICD-10-CM

## 2018-07-15 DIAGNOSIS — C7931 Secondary malignant neoplasm of brain: Secondary | ICD-10-CM

## 2018-07-15 DIAGNOSIS — E785 Hyperlipidemia, unspecified: Secondary | ICD-10-CM

## 2018-07-15 DIAGNOSIS — E119 Type 2 diabetes mellitus without complications: Secondary | ICD-10-CM

## 2018-07-15 DIAGNOSIS — Z7189 Other specified counseling: Secondary | ICD-10-CM

## 2018-07-15 DIAGNOSIS — C787 Secondary malignant neoplasm of liver and intrahepatic bile duct: Secondary | ICD-10-CM

## 2018-07-15 DIAGNOSIS — C182 Malignant neoplasm of ascending colon: Secondary | ICD-10-CM

## 2018-07-15 DIAGNOSIS — I2510 Atherosclerotic heart disease of native coronary artery without angina pectoris: Secondary | ICD-10-CM

## 2018-07-15 LAB — CBC WITH DIFFERENTIAL/PLATELET
BASOS ABS: 0 10*3/uL (ref 0.0–0.1)
Basophils Relative: 0 %
EOS PCT: 1 %
Eosinophils Absolute: 0 10*3/uL (ref 0.0–0.7)
HCT: 38.5 % — ABNORMAL LOW (ref 39.0–52.0)
Hemoglobin: 12.9 g/dL — ABNORMAL LOW (ref 13.0–17.0)
Lymphocytes Relative: 8 %
Lymphs Abs: 0.2 10*3/uL — ABNORMAL LOW (ref 0.7–4.0)
MCH: 29.2 pg (ref 26.0–34.0)
MCHC: 33.5 g/dL (ref 30.0–36.0)
MCV: 87.1 fL (ref 78.0–100.0)
MONO ABS: 0.2 10*3/uL (ref 0.1–1.0)
MONOS PCT: 6 %
Neutro Abs: 2.5 10*3/uL (ref 1.7–7.7)
Neutrophils Relative %: 85 %
Platelets: 143 10*3/uL — ABNORMAL LOW (ref 150–400)
RBC: 4.42 MIL/uL (ref 4.22–5.81)
RDW: 15 % (ref 11.5–15.5)
WBC: 2.9 10*3/uL — ABNORMAL LOW (ref 4.0–10.5)

## 2018-07-15 LAB — COMPREHENSIVE METABOLIC PANEL
ALBUMIN: 1.8 g/dL — AB (ref 3.5–5.0)
ALK PHOS: 160 U/L — AB (ref 38–126)
ALT: 23 U/L (ref 0–44)
AST: 13 U/L — AB (ref 15–41)
Anion gap: 7 (ref 5–15)
BUN: 13 mg/dL (ref 8–23)
CALCIUM: 7.5 mg/dL — AB (ref 8.9–10.3)
CO2: 23 mmol/L (ref 22–32)
CREATININE: 0.43 mg/dL — AB (ref 0.61–1.24)
Chloride: 101 mmol/L (ref 98–111)
GFR calc Af Amer: >60 mL/min (ref >60)
GFR calc non Af Amer: >60 mL/min (ref >60)
GLUCOSE: 274 mg/dL — AB (ref 70–99)
Potassium: 4.6 mmol/L (ref 3.5–5.1)
Sodium: 131 mmol/L — ABNORMAL LOW (ref 135–145)
Total Bilirubin: 0.3 mg/dL (ref 0.3–1.2)
Total Protein: 4.5 g/dL — ABNORMAL LOW (ref 6.5–8.1)

## 2018-07-15 LAB — GLUCOSE, CAPILLARY
GLUCOSE-CAPILLARY: 253 mg/dL — AB (ref 70–99)
Glucose-Capillary: 279 mg/dL — ABNORMAL HIGH (ref 70–99)
Glucose-Capillary: 269 mg/dL — ABNORMAL HIGH (ref 70–99)
Glucose-Capillary: 234 mg/dL — ABNORMAL HIGH (ref 70–99)

## 2018-07-15 LAB — TSH: TSH: 0.38 u[IU]/mL (ref 0.350–4.500)

## 2018-07-15 MED ORDER — SODIUM CHLORIDE 0.9 % IV SOLN
INTRAVENOUS | Status: DC
  Administered 2018-07-15 – 2018-07-16 (×4): via INTRAVENOUS

## 2018-07-15 MED ORDER — GLUCERNA SHAKE PO LIQD
237.0000 mL | Freq: Three times a day (TID) | ORAL | Status: DC
  Filled 2018-07-15 (×2): qty 237

## 2018-07-15 MED ORDER — MORPHINE SULFATE ER 15 MG PO TBCR
15.0000 mg | EXTENDED_RELEASE_TABLET | Freq: Three times a day (TID) | ORAL | Status: DC
  Administered 2018-07-15 (×3): 15 mg via ORAL
  Filled 2018-07-15 (×3): qty 1

## 2018-07-15 MED ORDER — PREMIER PROTEIN SHAKE
11.0000 [oz_av] | Freq: Two times a day (BID) | ORAL | Status: DC
  Administered 2018-07-16 – 2018-07-18 (×3): 11 [oz_av] via ORAL
  Filled 2018-07-15 (×8): qty 325.31

## 2018-07-15 MED ORDER — MIRTAZAPINE 15 MG PO TABS
15.0000 mg | ORAL_TABLET | Freq: Every day | ORAL | Status: DC
  Administered 2018-07-15 – 2018-07-17 (×3): 15 mg via ORAL
  Filled 2018-07-15 (×3): qty 1

## 2018-07-15 NOTE — Progress Notes (Signed)
Spoke to [facility] team nurse.  [name], in room, requested that port a cath be placed, instead of PICC at this time.  Reorder PICC, if in the future it is felt that it is needed instead.

## 2018-07-15 NOTE — Progress Notes (Signed)
Initial Nutrition Assessment  DOCUMENTATION CODES:   Not applicable  INTERVENTION:    Provide Premier Protein TID, each supplement provides 160kcal and 30g protein.   Encourage PO intake  NUTRITION DIAGNOSIS:   Increased nutrient needs related to cancer and cancer related treatments as evidenced by estimated needs.  GOAL:   Patient will meet greater than or equal to 90% of their needs  MONITOR:   PO intake, Supplement acceptance, Weight trends, Labs  REASON FOR ASSESSMENT:   Malnutrition Screening Tool    ASSESSMENT:   Patient with PMH significant for HTN, DM, BPH, CHF, HLD, CAD, and metastatic colon cancer with brain/thoracic spine metastasis s/p radiation therapy and resection of left cerebellar metastasis. Presented to his oncologist today for follow-up and complained of feeling poorly over the last week. Pt admitted with nausea/vomiting/diarrhea and dehydration.    Pt reports having a great appetite until about one week ago. He typically consumes 6-7 meals per day that consist of protein sources like chicken, beef, cheese, and 1-2 Premier Protein shakes. During this last week meal intake dropped to bites due to ongoing nausea. Appetite has remained poor this admission but his nausea is slowly subsiding with PRN zofran. He consumed corn flakes and a bagel this morning. Discussed the importance of protein intake for preservation of lean body mass, family is very supportive and aware. Will provide pt with premier protein in attempts to maximize calories and protein.   Pt endorses a UBW of 190 lb, the last time being at that weight a week ago. He reports losing down to 174 lb during his radiation treatments in June but has slowly gained back to 190 lb. Records indicate pt weighed 176-177 lb in June 2019 and 187 lb this admission. Wife seems to think pt weighs less. Will attempt to obtain new weight. Nutrition-Focused physical exam completed.   Medications reviewed and include:  Remeron, zofran PRN Labs reviewed: Na 131 (L) CBG 150-279   NUTRITION - FOCUSED PHYSICAL EXAM:    Most Recent Value  Orbital Region  No depletion  Upper Arm Region  No depletion  Thoracic and Lumbar Region  Unable to assess  Buccal Region  No depletion  Temple Region  Mild depletion  Clavicle Bone Region  No depletion  Clavicle and Acromion Bone Region  No depletion  Scapular Bone Region  Unable to assess  Dorsal Hand  No depletion  Patellar Region  Moderate depletion  Anterior Thigh Region  Moderate depletion  Posterior Calf Region  Moderate depletion  Edema (RD Assessment)  None  Hair  Reviewed  Eyes  Reviewed  Mouth  Reviewed  Skin  Reviewed  Nails  Reviewed     Diet Order:   Diet Order           Diet Carb Modified Fluid consistency: Thin; Room service appropriate? Yes  Diet effective now          EDUCATION NEEDS:   Education needs have been addressed  Skin:  Skin Assessment: Reviewed RN Assessment  Last BM:  07/14/18  Height:   Ht Readings from Last 1 Encounters:  07/14/18 6\' 2"  (1.88 m)    Weight:   Wt Readings from Last 1 Encounters:  07/15/18 187 lb 1.6 oz (84.9 kg)    Ideal Body Weight:  86.4 kg  BMI:  Body mass index is 24.02 kg/m.  Estimated Nutritional Needs:   Kcal:  2450-2650 kcal  Protein:  120-130 grams  Fluid:  >/= 2.4 L/day  Mariana Single RD, LDN Clinical Nutrition Pager # [phone]

## 2018-07-15 NOTE — Progress Notes (Signed)
Wife requesting for diet be changed to regular, because of patient's decreased appetite.  Wife says that she had conversation with [name] with palliative care, about changing diet to help with decreased appetite.

## 2018-07-15 NOTE — Progress Notes (Signed)
IP PROGRESS NOTE  Subjective:   Allen Glenn completed radiation to the spine on 07/11/2018.  He began to have nausea on 07/09/2018.  He developed diarrhea on 07/13/2018. He presented to the symptom management clinic yesterday.  He appeared dehydrated.  IV access could not be obtained.  He was admitted for further evaluation. He reports improvement in nausea today.  The back pain has improved following palliative radiation.  Objective: Vital signs in last 24 hours: Blood pressure 136/79, pulse 100, temperature 98.8 F (37.1 C), temperature source Oral, resp. rate (!) 21, weight 187 lb 1.6 oz (84.9 kg), SpO2 97 %.  Intake/Output from previous day: 07/22 0701 - 07/23 0700 In: 1025 [I.V.:1025] Out: 650 [Urine:650]  Physical Exam:  HEENT: No thrush Lungs: Clear bilaterally Cardiac: Regular rate and rhythm Abdomen: Nontender, no mass, no hepatomegaly Extremities: No leg edema  Lab Results: Recent Labs    07/14/18 1033 07/14/18 1737  WBC 3.0* 2.8*  HGB 14.8 14.5  HCT 43.2 42.7  PLT 108* 159    BMET Recent Labs    07/14/18 1033 07/14/18 1737 07/15/18 0551  NA 127*  --  131*  K 4.6  --  4.6  CL 97*  --  101  CO2 21*  --  23  GLUCOSE 185*  --  274*  BUN 16  --  13  CREATININE 0.58* 0.57* 0.43*  CALCIUM 8.3*  --  7.5*    Lab Results  Component Value Date   CEA1 31.01 (H) 07/02/2018    Studies/Results: Dg Chest Port 1 View  Result Date: 07/14/2018 CLINICAL DATA:  Weakness, abdominal pain, nausea EXAM: PORTABLE CHEST 1 VIEW COMPARISON:  Chest CT 02/25/2018 FINDINGS: Heart is normal size. Soft tissue fullness in the left hilar/infrahilar region. Cannot exclude adenopathy or enlarging mass. Right lung clear. No effusions. No acute bony abnormality. IMPRESSION: Soft tissue fullness in the left hilar/infrahilar region. Cannot exclude adenopathy or mass. This could be further evaluated with chest CT with IV contrast. Electronically Signed   By: Rolm Baptise M.D.   On: 07/14/2018  18:56   [name] Ekg Site Rite  Result Date: 07/14/2018 If Site Rite image not attached, placement could not be confirmed due to current cardiac rhythm.   Medications: I have reviewed the patient's current medications.  Assessment/Plan: 1. 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 No BRAFmutation  Liver biopsy 05/04/2015-metastatic adenocarcinoma consistent with a colon primary ? Staging PET scan 06/08/2015-isolated segment 4A liver lesion ? Initiation of adjuvant FOLFOX 06/13/2015 ? Restaging CT 08/09/2015 revealed a slight decrease in a borderline ileocolic node, decrease in the hepatic dome metastasis, no new lesions ? Liver resection 09/28/2015-pathology consistent with metastatic colon cancer, negative margins ? Adjuvant FOLFOX resumed 11/08/2015, oxaliplatin eliminated beginning 11/22/2015 secondary to neuropathy. He completed adjuvant chemotherapy 02/16/2016 ? Restaging chest CT 11/08/2016, compared to 05/07/2016 revealed a new 9 mm left upper lobe nodule, stable 2.2 cm right hepatic lesion ? PET scan 11/19/2016 confirmed a hypermetabolic left upper lobe nodule, hypermetabolic left paratracheal and pericardiac lymph nodes, and hypermetabolism associated with the hypoattenuating lesion in the dome of the liver ? Status post EBUSbiopsies of the left lingula nodule and a level 4Lnode on 11/28/2016-no evidence of malignancy ? CT chest 02/11/2017-increase in size of the left pulmonary nodule and epicardial lymph node ? Status post SBRT 2 the left lung nodule and mediastinum completed 03/14/2017 ? CTs 07/01/2017-new 9 mm  focus along the right liver capsule, stable left subcapsular liver lesion, radiation change at site of left upper lobe nodule, stable pericardiallymph node ? CTs 11/25/2017-new 5 mm lingular nodule, enlargement of capsular-based right  liver lesion, new capsular based right liver lesion capsular lesion at the hepatic dome ? CTs 02/25/2018-enlargement of small lung nodules, liver lesions and small mesenteric lymph node ? CT abdomen/pelvis 05/15/2018-new lower lobe pulmonary nodules, enlargement of liver lesions and right lower quadrant soft tissue nodules ? 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 Brain MRI 05/26/2018- 3.5 similar left posterior fossa mass, 4 additional subcentimeter enhancing brain lesions-3 cerebellar, 1 left thalamic ? SRS to all 5 brain lesions 06/02/2018 ? Surgical excision of left cerebellar lesion 06/04/2018, adenocarcinoma consistent with colorectal primary ? MRI lumbar spine 06/18/2018-extensive metastases to the lumbar spine, sacrum, T12, and pelvis, pathologic fracture of L3 ? Initiation of radiation from T12- S2 06/30/2018, completed 07/11/2018 2. Coronary artery disease status post a myocardial infarction in 2014  3. Hypertension  4. Hyperlipidemia  5. Diabetes  6. Admission 05/20/2018 with nausea/vomitingsecondary to a cerebellar metastasis  7.Oral candidiasis 06/11/2018- started on Mycelex atrocious  8.   Admission 07/14/2018 with nausea, diarrhea, and dehydration   Allen Glenn has metastatic colon cancer.  He was diagnosed with a cerebellar metastasis in June.  He underwent radiation followed by surgical resection.  He completed radiation to the spine on 07/11/2018.  He is now admitted with nausea, diarrhea, and dehydration.  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  However it is possible the nausea is related to inflammation in the brain.  I discussed the current symptoms with Allen Glenn and his wife.  We discussed treatment options for the metastatic colon cancer.  He would like to proceed with FOLFIRI chemotherapy when his clinical status improves.  Recommendations: 1.  Continue intravenous  hydration and antiemetics 2.  Advance diet as tolerated 3.  Proceed with Port-A-Cath placement 4.  Outpatient follow-up will be scheduled at the Cancer center        LOS: 1 day   Betsy Coder, MD   07/15/2018, 7:00 AM

## 2018-07-15 NOTE — Progress Notes (Signed)
PROGRESS NOTE                                                                                                                                                                                                             Patient Demographics:    Allen Glenn, is a 62 y.o. male, DOB - 08/12/1956, ZJI:[id number]  Admit date - 07/14/2018   Admitting Physician Kerney Elbe, DO  Outpatient Primary MD for the patient is [name], MD  LOS - 1   No chief complaint on file.      Brief Narrative    62 y.o. male with medical history significant of hypertension, diabetes mellitus type 2, BPH, history of diastolic CHF, hyperlipidemia, CAD, metastatic colon cancer with brain metastasis status post craniotomy tumor excision, lumbar metastasis status post radiation, presents with nausea, vomiting and dehydration .     Subjective:    Allen Glenn today has, No headache, No chest pain, No abdominal pain, no further vomiting since admission.  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 -See discussion below regarding pain regimen, started on IV fluids -Continue with Decadron -Oncology input greatly appreciated -On regular diet, Ensure and breeze, and have asked family to bring outside food if they want too, he is with poor appetite, I will double his Remeron dose, will discuss with palliative about my renal  generalized Weakness with poor po Intake and Dehydration -Please see above discussion, PT consulted.  Metastatic colon cancer with brain metastasis as well as  thoracic spine disease -With known brain mets, status post craniectomy tumor excision by Dr. Vertell Limber 06/04/2018. -With known thoracolumbar metastasis, just finished radiation therapy last Friday. -Please see discussion below about pain medicine, palliative to see. -Further management per oncology service Dr. Malachy Mood, he saw patient today, weekly he will request Port-A-Cath to be done during this hospital stay.  Cancer Related Pain -Palliative medicine consulted, continue with Decadron 4  mg twice daily(supposed to be decreased to 2 mg twice daily over last weekend) -Per wife his MS Contin has been decreased to 15 mg twice daily at home, with MS Contin 15 mg 3 times daily, palliative medicine to follow. -Continue  with PRN Robaxin  Hyponatremia/Hypochloremia -Continue to volume depletion, improving with IV fluids  CAD with history of STEMI -Continue atorvastatin 10 mg p.o. daily, and sublingual nitro, he denies any chest pain or shortness of breath -We will hold home lisinopril at this time  BPH -Continue with Tamsulosin .  GERD -Continue with Pantoprazole.  Hypertension -Toprol on hold given soft blood pressure .  Hyperlipidemia -Continue with atorvastatin 10 mg p.o. Daily  Diabetes Mellitus Type 2 -Insulin-dependent.  Continue home Basaglar 12 units subcu daily at bedtime but hold NovoLog -CBG uncontrolled, I have discontinued D5 NS fluids, he is currently on regular diet, if CBG remains elevated, then will need to increase his Lantus.    Code Status : FULL  Family Communication  : wife at bedside  Disposition Plan  : Home when stable  Consults  :  Oncology, Palliative  Procedures  : none  DVT Prophylaxis  :  [place] lovenox  Lab Results  Component Value Date   PLT 143 (L) 07/15/2018    Antibiotics  :    Anti-infectives (From admission, onward)   None        Objective:   Vitals:   07/14/18 1538 07/14/18 2008 07/15/18 0407 07/15/18 0500  BP: 131/84 (!)  106/58 136/79   Pulse: (!) 104 (!) 107 100   Resp:  (!) 22 (!) 21   Temp: 98.1 F (36.7 C) 98.5 F (36.9 C) 98.8 F (37.1 C)   TempSrc: Oral Oral Oral   SpO2: 99% 100% 97%   Weight:    84.9 kg (187 lb 1.6 oz)    Wt Readings from Last 3 Encounters:  07/15/18 84.9 kg (187 lb 1.6 oz)  07/02/18 86.8 kg (191 lb 6.4 oz)  06/19/18 86.3 kg (190 lb 3.2 oz)     Intake/Output Summary (Last 24 hours) at 07/15/2018 1400 Last data filed at 07/15/2018 0406 Gross per 24 hour  Intake 1025 ml  Output 650 ml  Net 375 ml     Physical Exam  Awake Alert, Oriented X 3, No new F.N deficits, Normal affect  Symmetrical Chest wall movement, Good air movement bilaterally, CTAB RRR,No Gallops,Rubs or new Murmurs, No Parasternal Heave +ve B.Sounds, Abd Soft, No tenderness,  No rebound - guarding or rigidity. No Cyanosis, Clubbing or edema, No new Rash or bruise     Data Review:    CBC Recent Labs  Lab 07/14/18 1033 07/14/18 1737 07/15/18 0551  WBC 3.0* 2.8* 2.9*  HGB 14.8 14.5 12.9*  HCT 43.2 42.7 38.5*  PLT 108* 159 143*  MCV 86.4 85.9 87.1  MCH 29.6 29.2 29.2  MCHC 34.3 34.0 33.5  RDW 15.1* 14.9 15.0  LYMPHSABS 0.5*  --  0.2*  MONOABS 0.4  --  0.2  EOSABS 0.1  --  0.0  BASOSABS 0.0  --  0.0    Chemistries  Recent Labs  Lab 07/14/18 1033 07/14/18 1737 07/15/18 0551  NA 127*  --  131*  K 4.6  --  4.6  CL 97*  --  101  CO2 21*  --  23  GLUCOSE 185*  --  274*  BUN 16  --  13  CREATININE 0.58* 0.57* 0.43*  CALCIUM 8.3*  --  7.5*  MG 2.1  --   --   AST 15  --  13*  ALT 30  --  23  ALKPHOS 247*  --  160*  BILITOT 0.4  --  0.3   ------------------------------------------------------------------------------------------------------------------ No results for input(s): CHOL, HDL, LDLCALC, TRIG, CHOLHDL, LDLDIRECT in the last 72 hours.  Lab Results  Component Value Date   HGBA1C 7.9 (H) 05/30/2018    ------------------------------------------------------------------------------------------------------------------ Recent Labs    07/15/18 0551  TSH 0.380   ------------------------------------------------------------------------------------------------------------------ No results for input(s): VITAMINB12, FOLATE, FERRITIN, TIBC, IRON, RETICCTPCT in the last 72 hours.  Coagulation profile No results for input(s): INR, PROTIME in the last 168 hours.  No results for input(s): DDIMER in the last 72 hours.  Cardiac Enzymes No results for input(s): CKMB, TROPONINI, MYOGLOBIN in the last 168 hours.  Invalid input(s): CK ------------------------------------------------------------------------------------------------------------------ No results found for: BNP  Inpatient Medications  Scheduled Meds: . atorvastatin  10 mg Oral Daily  . dexamethasone  4 mg Oral BID  . enoxaparin (LOVENOX) injection  40 mg Subcutaneous Q24H  . gabapentin  300 mg Oral BID  . gabapentin  300-600 mg Oral QHS  . insulin aspart  0-5 Units Subcutaneous QHS  . insulin aspart  0-9 Units Subcutaneous TID WC  . insulin glargine  12 Units Subcutaneous QHS  . mirtazapine  15 mg Oral QHS  . morphine  15 mg Oral TID  . pantoprazole  40 mg Oral Daily  . protein supplement shake  11 oz Oral BID BM  . tamsulosin  0.4 mg Oral QPC breakfast   Continuous Infusions: . sodium chloride 100 mL/hr at 07/15/18 0929   PRN Meds:.acetaminophen, methocarbamol, morphine, morphine injection, nitroGLYCERIN, ondansetron **OR** ondansetron (ZOFRAN) IV  Micro Results No results found for this or any previous visit (from the past 240 hour(s)).  Radiology Reports Mr Cervical Spine W Wo Contrast  Result Date: 07/04/2018 CLINICAL DATA:  Metastatic colon cancer.  Staging. EXAM: MRI CERVICAL SPINE WITHOUT AND WITH CONTRAST TECHNIQUE: Multiplanar and multiecho pulse sequences of the cervical spine, to include the craniocervical  junction and cervicothoracic junction, were obtained without and with intravenous contrast. CONTRAST:  15mL MULTIHANCE GADOBENATE DIMEGLUMINE 529 MG/ML IV SOLN COMPARISON:  No previous cervical imaging. FINDINGS: Alignment: Normal Vertebrae: Small foci of metastatic disease are evident in the posterior elements on the right at C5, posterior elements on the left at C4, within the superior posterior vertebral body at C3 and within the left inferior corner of the C6 vertebral body. No advanced cervical disease. No extraosseous disease in the cervical region. Cord: No cervical cord metastasis. Posterior Fossa, vertebral arteries, paraspinal tissues: Negative Disc levels: No degenerative change in the cervical region. IMPRESSION: Small cervical metastases, in the posterosuperior corner of the C3 vertebral body, the left inferior corner of the C6 vertebral body, the posterior elements on the right at C5 in the posterior elements on the left at C4. No extraosseous tumor. Electronically Signed   By: Nelson Chimes M.D.   On: 07/04/2018 17:03   Mr Thoracic Spine W Wo Contrast  Result Date: 07/04/2018 CLINICAL DATA:  Staging for colon cancer.  Back pain. EXAM: MRI THORACIC WITHOUT AND WITH CONTRAST TECHNIQUE: Multiplanar and multiecho pulse sequences of the thoracic spine were obtained without and with intravenous contrast. CONTRAST:  72mL MULTIHANCE GADOBENATE DIMEGLUMINE 529 MG/ML IV SOLN COMPARISON:  CT 02/25/2018. Lumbar MRI 06/18/2018. Cervical exam same day. FINDINGS: MRI THORACIC SPINE FINDINGS Alignment:  Slightly increased kyphotic curvature. Vertebrae: Extensive regional metastatic disease. 1  cm metastasis within the anterior T1 vertebral body. T2 is negative. T3 shows a small metastasis at the left inferior endplate. T4 shows replacement of the vertebral body by tumor. There is a tumor mass bulging into the posterior mediastinum on the right. No canal compromise. T5 shows tumor throughout the vertebral body,  extending into the posterior elements on the left. Tumor bulges into the ventral epidural space but there is no cord compression or foraminal compression. T6 shows near complete marrow replacement in the right side of the vertebral body by tumor. Tumor bulges into the ventral epidural space but does not cause frank cord compression. T7 is negative. T8 is negative. T9 is negative except for involvement of the right transverse process. No encroachment upon the neural structures. T10 shows punctate metastases within the vertebral body. T11 shows a punctate metastasis within the inferior vertebral body. T12 shows a 1 cm metastasis within the upper vertebral body without extraosseous extension. Scattered vertebral body metastases at L1 without extraosseous tumor. Cord: No primary cord metastasis or cord compression. As noted above, there is early epidural tumor involvement at T5 and T6. Paraspinal and other soft tissues: As noted above, there is right-sided posterior mediastinal paravertebral tumor adjacent to the T4 and T5 vertebral bodies. Early right paravertebral involvement at T6. Disc levels: No significant degenerative disc disease. IMPRESSION: Widespread metastatic disease throughout the thoracic region as outlined above. Disease is most pronounced at T4, T5 and T6 where there is extensive vertebral body marrow replacement by tumor. There is early ventral epidural tumor at T5 and T6 but no cord compression at this moment. No neural foraminal involvement. There is paravertebral tumor in the posterior mediastinum on the right at T4 and T5 and to an early extent at T6. Electronically Signed   By: Nelson Chimes M.D.   On: 07/04/2018 16:49   Dg Chest Port 1 View  Result Date: 07/14/2018 CLINICAL DATA:  Weakness, abdominal pain, nausea EXAM: PORTABLE CHEST 1 VIEW COMPARISON:  Chest CT 02/25/2018 FINDINGS: Heart is normal size. Soft tissue fullness in the left hilar/infrahilar region. Cannot exclude adenopathy or  enlarging mass. Right lung clear. No effusions. No acute bony abnormality. IMPRESSION: Soft tissue fullness in the left hilar/infrahilar region. Cannot exclude adenopathy or mass. This could be further evaluated with chest CT with IV contrast. Electronically Signed   By: Rolm Baptise M.D.   On: 07/14/2018 18:56   Dg Hip Unilat W Or W/o Pelvis 1 View Left  Result Date: 06/18/2018 CLINICAL DATA:  History of colonic malignancy. Patient is complaining of constant low back pain radiating to the left hip for the past week. No known injury. EXAM: DG HIP (WITH OR WITHOUT PELVIS) 1V*L* COMPARISON:  Coronal and sagittal reconstructed images through the pelvis and left hip from a CT scan dated May 15, 2018 FINDINGS: The bones are subjectively adequately mineralized. There is no acute fracture nor lytic or blastic lesion. AP and lateral views of the left hip reveal mild symmetric narrowing of the joint space. The articular surfaces of the femoral head and acetabulum remains smoothly rounded. The femoral neck, intertrochanteric, and subtrochanteric regions are normal. IMPRESSION: There is no acute bony abnormality of the pelvis or left hip. There is mild symmetric left hip joint space loss consistent with osteoarthritis. Electronically Signed   By: David  Martinique M.D.   On: 06/18/2018 14:54   [name] Ekg Site Rite  Result Date: 07/14/2018 If Site Rite image not attached, placement could not be confirmed due to  current cardiac rhythm.   Phillips Climes M.D on 07/15/2018 at 2:00 PM  Between 7am to 7pm - Pager - [phone]  After 7pm go to [email or web address] - password [facility]  [facility] -  Office  [phone]

## 2018-07-15 NOTE — Evaluation (Addendum)
Occupational Therapy Evaluation Patient Details Name: Allen Glenn MRN: [id number] DOB: 1956/08/02 Today's Date: 07/15/2018    History of Present Illness 62 y.o. male with medical history significant of hypertension, diabetes mellitus type 2, BPH, history of diastolic CHF, hyperlipidemia, CAD, metastatic colon cancer with brain metastasis as well as thoracic disease who went to his oncologist today for follow-up and was feeling poorly for the last week. Pt admitted with nausea/vomiting/diarrhea and dehydration.    Clinical Impression   This 62 y/o male presents with the above. At baseline pt reports using [facility] for functional mobility, and prior to hospitalization was completing ADLs without assist, was active. Pt completing short distance functional mobility in room using RW with minguard-minA this session; currently requires minguard-setup assist for seated UB ADL, maxA for LB and toileting ADLs. Pt presenting with generalized weakness and decreased activity tolerance. Pt with good family support including spouse who reports family is able to provide 24hr supervision/assist at time of discharge. Pt will benefit from continued acute OT services and recommend follow up [place] services to maximize his overall strength, safety and independence with ADLs and functional mobility after return home. Will follow.     Follow Up Recommendations  Home health OT;Supervision/Assistance - 24 hour(24hr initially )    Equipment Recommendations  None recommended by OT(pt's DME needs are met )           Precautions / Restrictions Precautions Precautions: Fall Precaution Comments: pt with back brace in room Restrictions Weight Bearing Restrictions: No      Mobility Bed Mobility Overal bed mobility: Needs Assistance Bed Mobility: Rolling;Sidelying to Sit Rolling: Min guard Sidelying to sit: Min guard       General bed mobility comments: minguard for safety, no physical assist required; utilized log  roll technique given pt has been using back brace with OOB activity   Transfers Overall transfer level: Needs assistance Equipment used: Rolling walker (2 wheeled) Transfers: Sit to/from Stand Sit to Stand: Min guard;Min assist         General transfer comment: assist to rise and steady at RW; minA from EOB, minguard from [facility]; pt demonstrating safe hand placement     Balance Overall balance assessment: Needs assistance Sitting-balance support: Feet supported Sitting balance-Leahy Scale: Good     Standing balance support: Bilateral upper extremity supported Standing balance-Leahy Scale: Poor Standing balance comment: utilizing UE support for standing balance this session                            ADL either performed or assessed with clinical judgement   ADL Overall ADL's : Needs assistance/impaired Eating/Feeding: Set up;Sitting   Grooming: Set up;Sitting;Wash/dry hands;Wash/dry face   Upper Body Bathing: Min guard;Sitting   Lower Body Bathing: Sit to/from stand;Moderate assistance   Upper Body Dressing : Set up;Sitting   Lower Body Dressing: Moderate assistance;Sit to/from stand Lower Body Dressing Details (indicate cue type and reason): assist to don socks this session; minA for sit<>stand and minA-minguard for standing balance  Toilet Transfer: Minimal assistance;Stand-pivot;BSC;RW   Toileting- Clothing Manipulation and Hygiene: Maximal assistance;Sit to/from stand;With caregiver independent assisting Toileting - Clothing Manipulation Details (indicate cue type and reason): pt's spouse present and assisting with clothing management and peri-care after BM; therapist providing minA for standing balance during task completion      Functional mobility during ADLs: Minimal assistance;Rolling walker General ADL Comments: pt completing stand pivot to [facility], toileting prior to taking few  steps to transfer to recliner using RW; pt with general fatigue and weakness  with activity                          Pertinent Vitals/Pain Pain Assessment: No/denies pain     Hand Dominance     Extremity/Trunk Assessment Upper Extremity Assessment Upper Extremity Assessment: Generalized weakness   Lower Extremity Assessment Lower Extremity Assessment: Defer to PT evaluation   Cervical / Trunk Assessment Cervical / Trunk Assessment: Normal   Communication Communication Communication: No difficulties   Cognition Arousal/Alertness: Awake/alert Behavior During Therapy: WFL for tasks assessed/performed Overall Cognitive Status: Within Functional Limits for tasks assessed                                     General Comments  pt's spouse present and engaged during session     Exercises     Shoulder Instructions      Home Living Family/patient expects to be discharged to:: Private residence Living Arrangements: Spouse/significant other Available Help at Discharge: Family;Available 24 hours/day Type of Home: House Home Access: Ramped entrance     Home Layout: Able to live on main level with bedroom/bathroom;Two level     Bathroom Shower/Tub: Occupational psychologist: Standard(has BSC over toilet )     Home Equipment: Bedside commode;Cane - single point;Shower seat          Prior Functioning/Environment Level of Independence: Independent with assistive device(s)        Comments: pt using SPC for mobility; up until recently was completing ADLs without assist, gardening and active         OT Problem List: Decreased strength;Impaired balance (sitting and/or standing);Decreased activity tolerance      OT Treatment/Interventions: Self-care/ADL training;DME and/or AE instruction;Therapeutic activities;Balance training;Therapeutic exercise;Patient/family education;Energy conservation    OT Goals(Current goals can be found in the care plan section) Acute Rehab OT Goals Patient Stated Goal: regain strength OT  Goal Formulation: With patient Time For Goal Achievement: 07/29/18 Potential to Achieve Goals: Good  OT Frequency: Min 2X/week   Barriers to D/C:            Co-evaluation              AM-PAC PT "6 Clicks" Daily Activity     Outcome Measure Help from another person eating meals?: None Help from another person taking care of personal grooming?: None Help from another person toileting, which includes using toliet, bedpan, or urinal?: A Lot Help from another person bathing (including washing, rinsing, drying)?: A Lot Help from another person to put on and taking off regular upper body clothing?: A Little Help from another person to put on and taking off regular lower body clothing?: A Lot 6 Click Score: 17   End of Session Equipment Utilized During Treatment: Gait belt;Rolling walker Nurse Communication: Mobility status  Activity Tolerance: Patient tolerated treatment well Patient left: in chair;with call bell/phone within reach;with family/visitor present  OT Visit Diagnosis: Muscle weakness (generalized) (M62.81)                Time: 1224-4975 OT Time Calculation (min): 25 min Charges:  OT General Charges $OT Visit: 1 Visit OT Evaluation $OT Eval Moderate Complexity: 1 Mod G-Codes:     Lou Cal, OT Pager [phone] 07/15/2018   Raymondo Band 07/15/2018, 10:13 AM

## 2018-07-15 NOTE — Evaluation (Signed)
Physical Therapy Evaluation Patient Details Name: Allen Glenn MRN: [id number] DOB: 12-16-1956 Today's Date: 07/15/2018   History of Present Illness  62 y.o. male with medical history significant of lumbar compression fx late June 2019,  hypertension, diabetes mellitus type 2, BPH, history of diastolic CHF, hyperlipidemia, CAD, metastatic colon cancer with brain metastasis as well as thoracic disease who went to his oncologist today for follow-up and was feeling poorly for the last week. Pt admitted with nausea/vomiting/diarrhea and dehydration.   Clinical Impression  Pt admitted with above diagnosis. Pt currently with functional limitations due to the deficits listed below (see PT Problem List). Pt ambulated 320' with RW, no loss of balance. Encouraged pt to ambulate in halls with his wife 2-3x/day to improve endurance.  Pt will benefit from skilled PT during acute stay  to increase their independence and safety with mobility to allow discharge to the venue listed below.       Follow Up Recommendations No PT follow up    Equipment Recommendations  None recommended by PT    Recommendations for Other Services       Precautions / Restrictions Precautions Precautions: Fall Precaution Comments: pt with back brace in room Restrictions Weight Bearing Restrictions: No      Mobility  Bed Mobility Overal bed mobility: Needs Assistance Bed Mobility: Supine to Sit Rolling: Min guard Sidelying to sit: Min guard Supine to sit: Modified independent (Device/Increase time);HOB elevated     General bed mobility comments: used rail  Transfers Overall transfer level: Needs assistance Equipment used: Rolling walker (2 wheeled) Transfers: Sit to/from Stand Sit to Stand: Min guard;From elevated surface         General transfer comment: bed elevated, min/guard for safety, good hand placement  Ambulation/Gait Ambulation/Gait assistance: Min guard Gait Distance (Feet): 320 Feet Assistive  device: Rolling walker (2 wheeled) Gait Pattern/deviations: Decreased stride length;Step-through pattern        Stairs            Wheelchair Mobility    Modified Rankin (Stroke Patients Only)       Balance Overall balance assessment: Needs assistance Sitting-balance support: Feet supported;No upper extremity supported Sitting balance-Leahy Scale: Good     Standing balance support: Bilateral upper extremity supported Standing balance-Leahy Scale: Fair Standing balance comment: utilizing UE support for standing balance this session                              Pertinent Vitals/Pain Pain Assessment: No/denies pain    Home Living Family/patient expects to be discharged to:: Private residence Living Arrangements: Spouse/significant other Available Help at Discharge: Family;Available 24 hours/day Type of Home: House Home Access: Ramped entrance     Home Layout: Able to live on main level with bedroom/bathroom;Two level Home Equipment: Bedside commode;Cane - single point;Shower seat;Wheelchair - [employer] - 2 wheels;Walker - 4 wheels      Prior Function Level of Independence: Independent with assistive device(s)         Comments: pt using SPC for mobility; up until recently was completing ADLs without assist, gardening and active until lumbar compression fx in late June     Hand Dominance        Extremity/Trunk Assessment   Upper Extremity Assessment Upper Extremity Assessment: Defer to OT evaluation    Lower Extremity Assessment Lower Extremity Assessment: Generalized weakness(B knee ext 4/5)    Cervical / Trunk Assessment Cervical / Trunk Assessment: Normal  Communication   Communication: No difficulties  Cognition Arousal/Alertness: Awake/alert Behavior During Therapy: WFL for tasks assessed/performed Overall Cognitive Status: Within Functional Limits for tasks assessed                                         General Comments General comments (skin integrity, edema, etc.): pt's spouse present and engaged during session     Exercises     Assessment/Plan    PT Assessment Patient needs continued PT services  PT Problem List Decreased activity tolerance;Decreased strength;Decreased balance       PT Treatment Interventions Gait training;Functional mobility training;Therapeutic exercise;Balance training;Patient/family education    PT Goals (Current goals can be found in the Care Plan section)  Acute Rehab PT Goals Patient Stated Goal: regain strength, work in yard PT Goal Formulation: With patient/family Time For Goal Achievement: 07/22/18 Potential to Achieve Goals: Good    Frequency Min 3X/week   Barriers to discharge        Co-evaluation               AM-PAC PT "6 Clicks" Daily Activity  Outcome Measure Difficulty turning over in bed (including adjusting bedclothes, sheets and blankets)?: A Little Difficulty moving from lying on back to sitting on the side of the bed? : A Little Difficulty sitting down on and standing up from a chair with arms (e.g., wheelchair, bedside commode, etc,.)?: A Little Help needed moving to and from a bed to chair (including a wheelchair)?: A Little Help needed walking in hospital room?: A Little Help needed climbing 3-5 steps with a railing? : A Lot 6 Click Score: 17    End of Session Equipment Utilized During Treatment: Gait belt Activity Tolerance: Patient tolerated treatment well Patient left: in bed;with call bell/phone within reach;with family/visitor present Nurse Communication: Mobility status PT Visit Diagnosis: Muscle weakness (generalized) (M62.81);Difficulty in walking, not elsewhere classified (R26.2)    Time: 9458-5929 PT Time Calculation (min) (ACUTE ONLY): 34 min   Charges:   PT Evaluation $PT Eval Low Complexity: 1 Low PT Treatments $Gait Training: 8-22 mins   PT G Codes:          Philomena Doheny 07/15/2018, 2:01 PM [phone]

## 2018-07-15 NOTE — Progress Notes (Signed)
PT Cancellation Note  Patient Details Name: Allen Glenn MRN: [id number] DOB: 10-Mar-1956   Cancelled Treatment:    Reason Eval/Treat Not Completed: Fatigue/lethargy limiting ability to participate(pt just returned to bed from the recliner and is fatigued. He requested PT attempt later. Will follow. )   Philomena Doheny 07/15/2018, 12:24 PM [phone]

## 2018-07-15 NOTE — Progress Notes (Signed)
Inpatient Diabetes Program Recommendations  AACE/ADA: New Consensus Statement on Inpatient Glycemic Control (2015)  Target Ranges:  Prepandial:   less than 140 mg/dL      Peak postprandial:   less than 180 mg/dL (1-2 hours)      Critically ill patients:  140 - 180 mg/dL   Lab Results  Component Value Date   GLUCAP 279 (H) 07/15/2018   HGBA1C 7.9 (H) 05/30/2018    Review of Glycemic Control  Diabetes history: DM 2 Outpatient Diabetes medications: Basaglar 12 units Daily, Humalog 2-15 units tid per SSI Current orders for Inpatient glycemic control: Lantus 12 units, Novolog 0-9 units tid, Novolog 0-5 units qhs  Inpatient Diabetes Program Recommendations:    Patients Decadron increased. Glucose in the upper 200's, Consider increasing Lantus to 14 units and increasing Novolog correction close to home correction 0-15 units tid.  Thanks,  Tama Headings RN, MSN, BC-ADM, [facility] Inpatient Diabetes Coordinator Team Pager [phone] (8a-5p)

## 2018-07-15 NOTE — Consult Note (Signed)
   [facility] CM Inpatient Consult   07/15/2018  Allen Glenn [birth year] [id number]   Went to bedside to speak with [name] on behalf of [employer] program for [employer] employees/dependents with [facility] insurance. [name] was resting upon visit. Did not want to disturb.  Left 24-hr nurse advice line magnet at bedside.    Marthenia Rolling, MSN-Ed, RN,BSN [facility] Liaison [phone]

## 2018-07-15 NOTE — Progress Notes (Signed)
FMLA for spouse, Matej Sappenfield, successfully faxed to [employer] at [phone]. Mailed copy to patient address on file.

## 2018-07-16 DIAGNOSIS — R531 Weakness: Secondary | ICD-10-CM

## 2018-07-16 DIAGNOSIS — I25118 Atherosclerotic heart disease of native coronary artery with other forms of angina pectoris: Secondary | ICD-10-CM

## 2018-07-16 DIAGNOSIS — Z794 Long term (current) use of insulin: Secondary | ICD-10-CM

## 2018-07-16 DIAGNOSIS — C7951 Secondary malignant neoplasm of bone: Secondary | ICD-10-CM

## 2018-07-16 DIAGNOSIS — C189 Malignant neoplasm of colon, unspecified: Secondary | ICD-10-CM

## 2018-07-16 DIAGNOSIS — G893 Neoplasm related pain (acute) (chronic): Secondary | ICD-10-CM

## 2018-07-16 DIAGNOSIS — E860 Dehydration: Secondary | ICD-10-CM

## 2018-07-16 DIAGNOSIS — D649 Anemia, unspecified: Secondary | ICD-10-CM

## 2018-07-16 DIAGNOSIS — R112 Nausea with vomiting, unspecified: Secondary | ICD-10-CM

## 2018-07-16 DIAGNOSIS — D61818 Other pancytopenia: Secondary | ICD-10-CM

## 2018-07-16 LAB — BASIC METABOLIC PANEL
Anion gap: 9 (ref 5–15)
BUN: 15 mg/dL (ref 8–23)
CO2: 20 mmol/L — ABNORMAL LOW (ref 22–32)
CREATININE: 0.45 mg/dL — AB (ref 0.61–1.24)
Calcium: 7.5 mg/dL — ABNORMAL LOW (ref 8.9–10.3)
Chloride: 102 mmol/L (ref 98–111)
GFR calc Af Amer: >60 mL/min (ref >60)
Glucose, Bld: 195 mg/dL — ABNORMAL HIGH (ref 70–99)
POTASSIUM: 4.2 mmol/L (ref 3.5–5.1)
SODIUM: 131 mmol/L — AB (ref 135–145)

## 2018-07-16 LAB — CBC
HCT: 35.8 % — ABNORMAL LOW (ref 39.0–52.0)
Hemoglobin: 12.1 g/dL — ABNORMAL LOW (ref 13.0–17.0)
MCH: 29.4 pg (ref 26.0–34.0)
MCHC: 33.8 g/dL (ref 30.0–36.0)
MCV: 86.9 fL (ref 78.0–100.0)
PLATELETS: 130 10*3/uL — AB (ref 150–400)
RBC: 4.12 MIL/uL — AB (ref 4.22–5.81)
RDW: 14.7 % (ref 11.5–15.5)
WBC: 2.7 10*3/uL — ABNORMAL LOW (ref 4.0–10.5)

## 2018-07-16 LAB — GLUCOSE, CAPILLARY
GLUCOSE-CAPILLARY: 211 mg/dL — AB (ref 70–99)
Glucose-Capillary: 246 mg/dL — ABNORMAL HIGH (ref 70–99)
Glucose-Capillary: 244 mg/dL — ABNORMAL HIGH (ref 70–99)
Glucose-Capillary: 214 mg/dL — ABNORMAL HIGH (ref 70–99)

## 2018-07-16 MED ORDER — LISINOPRIL 5 MG PO TABS
2.5000 mg | ORAL_TABLET | Freq: Every day | ORAL | Status: DC
  Administered 2018-07-16 – 2018-07-18 (×3): 2.5 mg via ORAL
  Filled 2018-07-16 (×3): qty 1

## 2018-07-16 MED ORDER — DRONABINOL 2.5 MG PO CAPS
2.5000 mg | ORAL_CAPSULE | Freq: Two times a day (BID) | ORAL | Status: DC
  Administered 2018-07-16 – 2018-07-18 (×5): 2.5 mg via ORAL
  Filled 2018-07-16 (×5): qty 1

## 2018-07-16 MED ORDER — ONDANSETRON HCL 4 MG/2ML IJ SOLN
4.0000 mg | Freq: Once | INTRAMUSCULAR | Status: AC
  Administered 2018-07-16: 4 mg via INTRAVENOUS
  Filled 2018-07-16: qty 2

## 2018-07-16 MED ORDER — MORPHINE SULFATE ER 15 MG PO TBCR
15.0000 mg | EXTENDED_RELEASE_TABLET | Freq: Two times a day (BID) | ORAL | Status: DC
  Administered 2018-07-16 – 2018-07-18 (×5): 15 mg via ORAL
  Filled 2018-07-16 (×5): qty 1

## 2018-07-16 NOTE — Progress Notes (Signed)
PROGRESS NOTE  Allen Glenn DOB: 1956/12/12 DOA: 07/14/2018 PCP: [name], MD  HPI  Allen Glenn is a 62 y.o. year old male with medical history significant for HTN, type 2 diabetes, BPH, diastolic CHF, HLD, CAD, metastatic colon cancer with brain mets who presented on 07/14/2018 as a direct admission from his oncology office.  He has had progressive fatigue diminished appetite since completing radiation therapy a week ago.  He was evaluated by his oncology team prior to admission at the office with concern for dehydration however was unable to place an IV line was directed to [facility] long for admission supportive care of nausea, vomiting, diarrhea in setting of dehydration and diminished p.o. intake.   Interval History Ate some last night.  Will try to eat again this morning.   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 intake.  Oncology [name] following  Dehydration persistent nausea/vomiting and diminished p.o. intake.  Creatinine remains normal.  Continue supportive care with IV fluids.  Plan to obtain Port-A-Cath during this admission.  Metastatic colon cancer with brain metastasis (status post left cerebellar excision 06/04/2018) and extensive metastasis to lumbar, sacrum, T12, pelvis Completed palliative radiation therapy 7/8-7/19/2019.  Oncology recommends tapering MS Contin twice daily, continue morphine immediate release 15 mg every 4 as needed.  Follow-up as outpatient on 07/29/2018  Pancytopenia, stable.  WBC 2.7 ( baseline 5-11), hemoglobin 12.1, platelets 130.  No current signs of  bleeding.  Monitor CBC  Hyponatremia, resolved.  Likely hypovolemic hyponatremia in setting of decreased p.o. intake.  Improved with IV fluids.  Continue to monitor.  CAD.  Continue atorvastatin and nitro as needed.   BPH, stable Continue tamsulosin  GERD, stable Continue Protonix  Hypertension, not at goal.  300-923 systolics.  Likely elevated in the setting of holding home lisinopril on admission had transient blood pressure 106/58.  Has improved greatly with IV fluids.  Will resume home lisinopril.  Type 2 diabetes.  A1c 7.9% (05/2018).  Glucose elevated to 50s.  Likely worsened control in setting of Decadron use.  Continue Lantus 12 units.  Monitor SSI and CBG  Code Status: Full code  Family Communication: Wife at bedside  Disposition Plan: Port-A-Cath for IV fluid access, continue to monitor pain control, improvement in oral intake.   Consultants:   Treatment Team:   Ladell Pier, MD    Procedures:  none  Antimicrobials: Anti-infectives (From admission, onward)   None         Cultures:  None  Telemetry:  DVT prophylaxis: Lovenox   Objective: Vitals:   07/15/18 0500 07/15/18 1424 07/15/18 2017 07/16/18 0410  BP:  133/79 (!) 168/86 (!) 148/70  Pulse:  96 99 93  Resp:  18 20 20   Temp:  97.8 F (36.6 C) 98.6 F (37 C) 97.6 F (36.4 C)  TempSrc:  Oral Oral Oral  SpO2:  98% 97% 98%  Weight: 84.9 kg (187 lb 1.6 oz)       Intake/Output Summary (Last 24 hours) at 07/16/2018 0731 Last data filed at 07/16/2018 0409 Gross per 24 hour  Intake 871.67 ml  Output  750 ml  Net 121.67 ml   Filed Weights   07/15/18 0500  Weight: 84.9 kg (187 lb 1.6 oz)    Exam:  Constitutional: Chronically ill-appearing male ENMT: Oropharynx with moist mucous membranes, normal dentition Cardiovascular: RRR no MRGs, with no peripheral edema Respiratory: Normal respiratory effort on room air, clear breath sounds  Abdomen: Soft,non-tender, with no HSM Skin: No  rash ulcers, or lesions. Without skin tenting  Neurologic: Grossly no focal neuro deficit. Psychiatric:Appropriate affect, and mood. Mental status AAOx3  Data Reviewed: CBC: Recent Labs  Lab 07/14/18 1033 07/14/18 1737 07/15/18 0551  WBC 3.0* 2.8* 2.9*  NEUTROABS 2.0  --  2.5  HGB 14.8 14.5 12.9*  HCT 43.2 42.7 38.5*  MCV 86.4 85.9 87.1  PLT 108* 159 341*   Basic Metabolic Panel: Recent Labs  Lab 07/14/18 1033 07/14/18 1737 07/15/18 0551  NA 127*  --  131*  K 4.6  --  4.6  CL 97*  --  101  CO2 21*  --  23  GLUCOSE 185*  --  274*  BUN 16  --  13  CREATININE 0.58* 0.57* 0.43*  CALCIUM 8.3*  --  7.5*  MG 2.1  --   --    GFR: Estimated Creatinine Clearance: 112.7 mL/min (A) (by C-G formula based on SCr of 0.43 mg/dL (L)). Liver Function Tests: Recent Labs  Lab 07/14/18 1033 07/15/18 0551  AST 15 13*  ALT 30 23  ALKPHOS 247* 160*  BILITOT 0.4 0.3  PROT 5.4* 4.5*  ALBUMIN 2.0* 1.8*   No results for input(s): LIPASE, AMYLASE in the last 168 hours. No results for input(s): AMMONIA in the last 168 hours. Coagulation Profile: No results for input(s): INR, PROTIME in the last 168 hours. Cardiac Enzymes: No results for input(s): CKTOTAL, CKMB, CKMBINDEX, TROPONINI in the last 168 hours. BNP (last 3 results) No results for input(s): PROBNP in the last 8760 hours. HbA1C: No results for input(s): HGBA1C in the last 72 hours. CBG: Recent Labs  Lab 07/15/18 0801 07/15/18 1245 07/15/18 1704 07/15/18 2013 07/16/18 0729  GLUCAP 279* 269* 234* 253* 211*   Lipid Profile: No results for input(s): CHOL, HDL, LDLCALC, TRIG, CHOLHDL, LDLDIRECT in the last 72 hours. Thyroid Function Tests: Recent Labs    07/15/18 0551  TSH 0.380   Anemia Panel: No results for input(s): VITAMINB12, FOLATE, FERRITIN, TIBC, IRON, RETICCTPCT in the last 72 hours. Urine analysis:    Component Value Date/Time   COLORURINE YELLOW 05/19/2018 1825   APPEARANCEUR CLEAR 05/19/2018 1825    LABSPEC >1.030 (H) 05/19/2018 1825   PHURINE 6.0 05/19/2018 1825   GLUCOSEU NEGATIVE 05/19/2018 1825   HGBUR NEGATIVE 05/19/2018 1825   BILIRUBINUR NEGATIVE 05/19/2018 1825   KETONESUR 15 (A) 05/19/2018 1825   PROTEINUR NEGATIVE 05/19/2018 1825   NITRITE NEGATIVE 05/19/2018 1825   LEUKOCYTESUR NEGATIVE 05/19/2018 1825   Sepsis Labs: @LABRCNTIP (procalcitonin:4,lacticidven:4)  )No results found for this or any previous visit (from the past 240 hour(s)).    Studies: No results found.  Scheduled Meds: . atorvastatin  10 mg Oral Daily  . dexamethasone  4 mg Oral BID  . enoxaparin (LOVENOX) injection  40 mg Subcutaneous Q24H  . gabapentin  300 mg Oral BID  . gabapentin  300-600 mg Oral QHS  . insulin aspart  0-5 Units Subcutaneous QHS  . insulin aspart  0-9 Units Subcutaneous TID WC  . insulin glargine  12 Units Subcutaneous QHS  . mirtazapine  15 mg Oral QHS  .  morphine  15 mg Oral TID  . pantoprazole  40 mg Oral Daily  . protein supplement shake  11 oz Oral BID BM  . tamsulosin  0.4 mg Oral QPC breakfast    Continuous Infusions: . sodium chloride 75 mL/hr at 07/16/18 0656     LOS: 2 days     Desiree Hane, MD [facility] Pager [phone]  If 7PM-7AM, please contact night-coverage [email or web address] Password [facility] 07/16/2018, 7:31 AM

## 2018-07-16 NOTE — Progress Notes (Signed)
Inpatient Diabetes Program Recommendations  AACE/ADA: New Consensus Statement on Inpatient Glycemic Control (2015)  Target Ranges:  Prepandial:   less than 140 mg/dL      Peak postprandial:   less than 180 mg/dL (1-2 hours)      Critically ill patients:  140 - 180 mg/dL   Results for EDSEL, SHIVES (MRN [id number]) as of 07/16/2018 10:28  Ref. Range 07/15/2018 08:01 07/15/2018 12:45 07/15/2018 17:04 07/15/2018 20:13  Glucose-Capillary Latest Ref Range: 70 - 99 mg/dL 279 (H)  5 units NOVOLOG  269 (H)  5 units NOVOLOG  234 (H)  3 units NOVOLOG  253 (H)  3 units NOVOLOG +  12 units LANTUS    Results for JOREL, GRAVLIN (MRN [id number]) as of 07/16/2018 10:28  Ref. Range 07/16/2018 07:29  Glucose-Capillary Latest Ref Range: 70 - 99 mg/dL 211 (H)  3 units NOVOLOG     Home DM Meds: Basaglar 12 units QHS       Humalog 2-15 units TID per SSI  Current Insulin Orders: Lantus 12 units QHS      Novolog Sensitive Correction Scale/ SSI (0-9 units) TID AC + HS       Note patient getting Decadron 4 mg BID.  CBGs consistently elevated >200 mg/dl.    MD- Please consider the following in-hospital insulin adjustments:  1. Increase Lantus to 15 units QHS  2. Increase Novolog SSI to Moderate scale (0-15 units) TID AC + HS     --Will follow patient during hospitalization--  Wyn Quaker RN, MSN, CDE Diabetes Coordinator Inpatient Glycemic Control Team Team Pager: [phone] (8a-5p)

## 2018-07-16 NOTE — H&P (Addendum)
Chief Complaint: Metastatic colon cancer  Referring Physician(s): Betsy Coder  Supervising Physician: Aletta Edouard  Patient Status: [facility] - In-pt  History of Present Illness: Allen Glenn is a 62 y.o. male with medical history significant for HTN, type 2 diabetes, BPH, diastolic CHF, HLD, CAD, metastatic colon cancer with brain mets who presented on 07/14/2018 as a direct admission from his oncology office.    He has had progressive fatigue and diminished appetite since completing radiation therapy a week ago.    He also had nausea, vomiting, and diarrhea which had caused dehydration.  He was sent to [facility] hospital for admission.  He was on our outpatient schedule for placement of a Port A Cath.  We are asked to go ahead and place this during this admission.  Past Medical History:  Diagnosis Date  . Acute MI ([place]) 2014   acute ST elevation MI  . Anemia   . Colon cancer ([place]) 2016   Status post resection of colon mass is well as he panic metastasis.   . Coronary artery disease   . Diabetes mellitus without complication ([place])   . Enlarged prostate    slightly and takes Flomax daily  . Heart disease   . History of blood transfusion   . Hyperlipidemia    takes Lipitor daily  . Hypertension    takes Lisinopril daily  . Lung nodule    left  . Nocturia   . Numbness    left foot  . Pancreatitis   . Peripheral neuropathy     Past Surgical History:  Procedure Laterality Date  . 1/8 of liver removed    . APPENDECTOMY    . APPLICATION OF CRANIAL NAVIGATION Left 06/04/2018   Procedure: APPLICATION OF CRANIAL NAVIGATION;  Surgeon: Erline Levine, MD;  Location: [place];  Service: Neurosurgery;  Laterality: Left;  . BIOPSY  05/22/2018   Procedure: BIOPSY;  Surgeon: Carol Ada, MD;  Location: [facility];  Service: Endoscopy;;  . BRAIN SURGERY  06/04/2018  . COLONOSCOPY    . CORONARY ANGIOPLASTY     1 stent  . CORONARY STENT PLACEMENT  06-27-2013  . CRANIOTOMY Left  06/04/2018   Procedure: CRANIOTOMY TUMOR EXCISION with Brainlab;  Surgeon: Erline Levine, MD;  Location: [place];  Service: Neurosurgery;  Laterality: Left;  CRANIOTOMY TUMOR EXCISION with Brainlab  . ESOPHAGOGASTRODUODENOSCOPY Left 05/22/2018   Procedure: ESOPHAGOGASTRODUODENOSCOPY (EGD);  Surgeon: Carol Ada, MD;  Location: [facility];  Service: Endoscopy;  Laterality: Left;  . HERNIA REPAIR Left 1991  . LAPAROSCOPIC RIGHT COLECTOMY  2016   [place]  . LIVER LOBECTOMY Right 09/28/2015    Right partial hepate[facility] at [facility]  . LUNG BIOPSY Left 11/28/2016   Procedure: LUNG BIOPSY, left upper lobe;  Surgeon: [name], MD;  Location: [place];  Service: Thoracic;  Laterality: Left;  Marland Kitchen VIDEO BRONCHOSCOPY WITH ENDOBRONCHIAL NAVIGATION N/A 11/28/2016   Procedure: VIDEO BRONCHOSCOPY WITH ENDOBRONCHIAL NAVIGATION;  Surgeon: [name], MD;  Location: [place];  Service: Thoracic;  Laterality: N/A;  . VIDEO BRONCHOSCOPY WITH ENDOBRONCHIAL ULTRASOUND N/A 11/28/2016   Procedure: VIDEO BRONCHOSCOPY WITH ENDOBRONCHIAL ULTRASOUND;  Surgeon: [name], MD;  Location: [place];  Service: Thoracic;  Laterality: N/A;    Allergies: Patient has no known allergies.  Medications: Prior to Admission medications   Medication Sig Start Date End Date Taking? Authorizing Provider  acetaminophen (TYLENOL) 500 MG tablet Take 500 mg by mouth every 6 (six) hours as needed (FOR PAIN/HEADACHES.).  Yes [provider]  atorvastatin (LIPITOR) 10 MG tablet Take 10 mg by mouth daily.    Yes [provider]  clotrimazole (MYCELEX) 10 MG troche Take 1 tablet (10 mg total) by mouth 4 (four) times daily. Take while on decadron 06/11/18  Yes Ladell Pier, MD  Coenzyme Q10 (COQ10) 200 MG CAPS Take 200 mg by mouth daily.   Yes [provider]  dexamethasone (DECADRON) 4 MG tablet Take 1 tablet (4 mg total) by mouth daily. 06/21/18  Yes Hayden Pedro, PA-C  gabapentin (NEURONTIN)  300 MG capsule Take 1 capsule (300 mg total) by mouth 3 (three) times daily. 1 to 2 PO QHS 06/25/18  Yes Hayden Pedro, PA-C  HUMALOG KWIKPEN 100 UNIT/ML KiwkPen Inject 2-15 Units into the skin See admin instructions. 121-150=2 UNITS, 151-200=3 UNITS, 201-250=5 UNITS, 251-300=8 UNITS, 301-350=11 UNITS, 351-400=15 UNITS 05/26/18  Yes [provider]  Insulin Glargine (BASAGLAR KWIKPEN) 100 UNIT/ML SOPN Inject 0.12 mLs (12 Units total) into the skin at bedtime. 05/26/18  Yes Mikhail, Velta Addison, DO  lisinopril (PRINIVIL,ZESTRIL) 2.5 MG tablet Take 2.5 mg by mouth daily. 02/16/15  Yes [provider]  methocarbamol (ROBAXIN) 500 MG tablet Take 500 mg by mouth every 8 (eight) hours as needed. 07/08/18  Yes [provider]  morphine (MS CONTIN) 30 MG 12 hr tablet Take 1 tablet (30 mg total) by mouth 3 (three) times daily. 07/02/18  Yes Ladell Pier, MD  morphine (MSIR) 15 MG tablet Take 1 tablet (15 mg total) by mouth every 4 (four) hours as needed for severe pain. 07/02/18  Yes Ladell Pier, MD  nitroGLYCERIN (NITROSTAT) 0.4 MG SL tablet Place 0.4 mg under the tongue every 5 (five) minutes as needed for chest pain. CALL 911 IF PAIN PERSISTS 11/24/14  Yes [provider]  omeprazole (PRILOSEC) 20 MG capsule Take 20 mg by mouth daily.   Yes [provider]  ondansetron (ZOFRAN-ODT) 4 MG disintegrating tablet Take 1 tablet (4 mg total) by mouth every 8 (eight) hours as needed for nausea or vomiting. 05/26/18  Yes Mikhail, Velta Addison, DO  tamsulosin (FLOMAX) 0.4 MG CAPS capsule Take 0.4 mg by mouth daily after breakfast. 30 minutes after breakfast 02/24/15  Yes [provider]  trolamine salicylate (ASPERCREME) 10 % cream Apply 1 application topically 2 (two) times daily as needed (FOR BACK PAIN.).   Yes [provider]  Continuous Blood Gluc Sensor (FREESTYLE LIBRE 14 DAY SENSOR) MISC 1 application by Does not apply route every 14 (fourteen) days.  06/06/18   Mikhail, Velta Addison, DO  Insulin Pen Needle 31G X 5 MM MISC Use with insulin pen. 05/26/18   Mikhail, Velta Addison, DO  methocarbamol (ROBAXIN) 750 MG tablet Take 1 tablet (750 mg total) by mouth every 8 (eight) hours as needed for muscle spasms. Patient not taking: Reported on 07/14/2018 05/26/18   Cristal Ford, DO  mirtazapine (REMERON) 7.5 MG tablet Take 1 tablet (7.5 mg total) by mouth at bedtime. 07/14/18   Tanner, Lyndon Code., PA-C  zolpidem (AMBIEN) 5 MG tablet Take 1 tablet (5 mg total) by mouth at bedtime as needed for sleep. Patient not taking: Reported on 07/14/2018 05/26/18   Cristal Ford, DO     Family History  Problem Relation Age of Onset  . Hypertension Mother   . Heart disease Mother   . Hypertension Father   . Heart disease Father   . Neuropathy Neg Hx     Social History   Socioeconomic  History  . Marital status: Married    Spouse name: [name]  . Number of children: 2  . Years of education: Bachelor  . Highest education level: Not on file  Occupational History  . Occupation: Company secretary  Social Needs  . Financial resource strain: Not on file  . Food insecurity:    Worry: Not on file    Inability: Not on file  . Transportation needs:    Medical: Not on file    Non-medical: Not on file  Tobacco Use  . Smoking status: Never Smoker  . Smokeless tobacco: Never Used  Substance and Sexual Activity  . Alcohol use: No    Alcohol/week: 0.0 oz  . Drug use: No  . Sexual activity: Not on file  Lifestyle  . Physical activity:    Days per week: Not on file    Minutes per session: Not on file  . Stress: Not on file  Relationships  . Social connections:    Talks on phone: Not on file    Gets together: Not on file    Attends religious service: Not on file    Active member of club or organization: Not on file    Attends meetings of clubs or organizations: Not on file    Relationship status: Not on file  Other Topics Concern  . Not on file  Social History Narrative    Live at home with wife, [name]   Patient is a [employer] manager, active and goes to gym   Caffeine: very little soda, occasional tea. 12oz-16oz/per     Review of Systems: A 12 point ROS discussed and pertinent positives are indicated in the HPI above.  All other systems are negative. Review of Systems  Vital Signs: BP (!) 149/81 (BP Location: Right Arm)   Pulse 80   Temp 97.9 F (36.6 C) (Oral)   Resp 18   Wt 187 lb 1.6 oz (84.9 kg)   SpO2 99%   BMI 24.02 kg/m   Physical Exam  Constitutional: He is oriented to person, place, and time. He appears well-developed.  HENT:  Head: Normocephalic and atraumatic.  Eyes: EOM are normal.  Neck: Normal range of motion.  Cardiovascular: Normal rate, regular rhythm and normal heart sounds.  Pulmonary/Chest: Effort normal and breath sounds normal. No respiratory distress.  Abdominal: Soft. There is no tenderness.  Musculoskeletal: Normal range of motion.  Neurological: He is alert and oriented to person, place, and time.  Skin: Skin is warm and dry.  Psychiatric: He has a normal mood and affect. His behavior is normal. Judgment and thought content normal.  Vitals reviewed.   Imaging: Mr Cervical Spine W Wo Contrast  Result Date: 07/04/2018 CLINICAL DATA:  Metastatic colon cancer.  Staging. EXAM: MRI CERVICAL SPINE WITHOUT AND WITH CONTRAST TECHNIQUE: Multiplanar and multiecho pulse sequences of the cervical spine, to include the craniocervical junction and cervicothoracic junction, were obtained without and with intravenous contrast. CONTRAST:  49mL MULTIHANCE GADOBENATE DIMEGLUMINE 529 MG/ML IV SOLN COMPARISON:  No previous cervical imaging. FINDINGS: Alignment: Normal Vertebrae: Small foci of metastatic disease are evident in the posterior elements on the right at C5, posterior elements on the left at C4, within the superior posterior vertebral body at C3 and within the left inferior corner of the C6 vertebral body. No advanced cervical  disease. No extraosseous disease in the cervical region. Cord: No cervical cord metastasis. Posterior Fossa, vertebral arteries, paraspinal tissues: Negative Disc levels: No degenerative change in the cervical region. IMPRESSION: Small cervical  metastases, in the posterosuperior corner of the C3 vertebral body, the left inferior corner of the C6 vertebral body, the posterior elements on the right at C5 in the posterior elements on the left at C4. No extraosseous tumor. Electronically Signed   By: Nelson Chimes M.D.   On: 07/04/2018 17:03   Mr Thoracic Spine W Wo Contrast  Result Date: 07/04/2018 CLINICAL DATA:  Staging for colon cancer.  Back pain. EXAM: MRI THORACIC WITHOUT AND WITH CONTRAST TECHNIQUE: Multiplanar and multiecho pulse sequences of the thoracic spine were obtained without and with intravenous contrast. CONTRAST:  50mL MULTIHANCE GADOBENATE DIMEGLUMINE 529 MG/ML IV SOLN COMPARISON:  CT 02/25/2018. Lumbar MRI 06/18/2018. Cervical exam same day. FINDINGS: MRI THORACIC SPINE FINDINGS Alignment:  Slightly increased kyphotic curvature. Vertebrae: Extensive regional metastatic disease. 1 cm metastasis within the anterior T1 vertebral body. T2 is negative. T3 shows a small metastasis at the left inferior endplate. T4 shows replacement of the vertebral body by tumor. There is a tumor mass bulging into the posterior mediastinum on the right. No canal compromise. T5 shows tumor throughout the vertebral body, extending into the posterior elements on the left. Tumor bulges into the ventral epidural space but there is no cord compression or foraminal compression. T6 shows near complete marrow replacement in the right side of the vertebral body by tumor. Tumor bulges into the ventral epidural space but does not cause frank cord compression. T7 is negative. T8 is negative. T9 is negative except for involvement of the right transverse process. No encroachment upon the neural structures. T10 shows punctate  metastases within the vertebral body. T11 shows a punctate metastasis within the inferior vertebral body. T12 shows a 1 cm metastasis within the upper vertebral body without extraosseous extension. Scattered vertebral body metastases at L1 without extraosseous tumor. Cord: No primary cord metastasis or cord compression. As noted above, there is early epidural tumor involvement at T5 and T6. Paraspinal and other soft tissues: As noted above, there is right-sided posterior mediastinal paravertebral tumor adjacent to the T4 and T5 vertebral bodies. Early right paravertebral involvement at T6. Disc levels: No significant degenerative disc disease. IMPRESSION: Widespread metastatic disease throughout the thoracic region as outlined above. Disease is most pronounced at T4, T5 and T6 where there is extensive vertebral body marrow replacement by tumor. There is early ventral epidural tumor at T5 and T6 but no cord compression at this moment. No neural foraminal involvement. There is paravertebral tumor in the posterior mediastinum on the right at T4 and T5 and to an early extent at T6. Electronically Signed   By: Nelson Chimes M.D.   On: 07/04/2018 16:49   Dg Chest Port 1 View  Result Date: 07/14/2018 CLINICAL DATA:  Weakness, abdominal pain, nausea EXAM: PORTABLE CHEST 1 VIEW COMPARISON:  Chest CT 02/25/2018 FINDINGS: Heart is normal size. Soft tissue fullness in the left hilar/infrahilar region. Cannot exclude adenopathy or enlarging mass. Right lung clear. No effusions. No acute bony abnormality. IMPRESSION: Soft tissue fullness in the left hilar/infrahilar region. Cannot exclude adenopathy or mass. This could be further evaluated with chest CT with IV contrast. Electronically Signed   By: Rolm Baptise M.D.   On: 07/14/2018 18:56   Dg Hip Unilat W Or W/o Pelvis 1 View Left  Result Date: 06/18/2018 CLINICAL DATA:  History of colonic malignancy. Patient is complaining of constant low back pain radiating to the left  hip for the past week. No known injury. EXAM: DG HIP (WITH OR WITHOUT PELVIS) 1V*L*  COMPARISON:  Coronal and sagittal reconstructed images through the pelvis and left hip from a CT scan dated May 15, 2018 FINDINGS: The bones are subjectively adequately mineralized. There is no acute fracture nor lytic or blastic lesion. AP and lateral views of the left hip reveal mild symmetric narrowing of the joint space. The articular surfaces of the femoral head and acetabulum remains smoothly rounded. The femoral neck, intertrochanteric, and subtrochanteric regions are normal. IMPRESSION: There is no acute bony abnormality of the pelvis or left hip. There is mild symmetric left hip joint space loss consistent with osteoarthritis. Electronically Signed   By: David  Martinique M.D.   On: 06/18/2018 14:54   [name] Ekg Site Rite  Result Date: 07/14/2018 If Site Rite image not attached, placement could not be confirmed due to current cardiac rhythm.   Labs:  CBC: Recent Labs    07/14/18 1033 07/14/18 1737 07/15/18 0551 07/16/18 0635  WBC 3.0* 2.8* 2.9* 2.7*  HGB 14.8 14.5 12.9* 12.1*  HCT 43.2 42.7 38.5* 35.8*  PLT 108* 159 143* 130*    COAGS: No results for input(s): INR, APTT in the last 8760 hours.  BMP: Recent Labs    07/02/18 0818 07/14/18 1033 07/14/18 1737 07/15/18 0551 07/16/18 0635  NA 132* 127*  --  131* 131*  K 4.6 4.6  --  4.6 4.2  CL 97* 97*  --  101 102  CO2 24 21*  --  23 20*  GLUCOSE 308* 185*  --  274* 195*  BUN 30* 16  --  13 15  CALCIUM 8.3* 8.3*  --  7.5* 7.5*  CREATININE 0.73 0.58* 0.57* 0.43* 0.45*  GFRNONAA >60 >60 >60 >60 >60  GFRAA >60 >60 >60 >60 >60    LIVER FUNCTION TESTS: Recent Labs    05/25/18 0455 07/02/18 0818 07/14/18 1033 07/15/18 0551  BILITOT 0.5 0.3 0.4 0.3  AST 31 22 15  13*  ALT 18 43 30 23  ALKPHOS 251* 384* 247* 160*  PROT 5.6* 6.0* 5.4* 4.5*  ALBUMIN 3.2* 3.2* 2.0* 1.8*    TUMOR MARKERS: No results for input(s): AFPTM, CEA, CA199,  CHROMGRNA in the last 8760 hours.  Assessment and Plan:  Metastatic colon cancer  Profound dehydration due to diarrhea, nausea and vomiting, and decreased appetite.  Will proceed with placement on Port A Cath on Friday.  NPO 6 hours prior to procedure and no Lovenox/Heparin Thursday night.  Risks and benefits of image guided port-a-catheter placement was discussed with the patient including, but not limited to bleeding, infection, pneumothorax, or fibrin sheath development and need for additional procedures.  All of the patient's questions were answered, patient is agreeable to proceed. Consent signed and in chart.  Thank you for this interesting consult.  I greatly enjoyed meeting Case D Mace and look forward to participating in their care.  A copy of this report was sent to the requesting provider on this date.  Electronically Signed: Murrell Redden, PA-C   07/16/2018, 3:41 PM      I spent a total of 20 Minutes in face to face in clinical consultation, greater than 50% of which was counseling/coordinating care for [facility] A Cath.

## 2018-07-16 NOTE — Consult Note (Signed)
Consultation Note Date: 07/16/2018   Patient Name: Allen Glenn  DOB: 1956-10-13  MRN: [id number]  Age / Sex: 62 y.o., male  PCP: [name], MD Referring Physician: Desiree Hane, MD  Reason for Consultation: Establishing goals of care and Psychosocial/spiritual support  HPI/Patient Profile: 62 y.o. 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  Completed radiation on Friday.  Progressive weakness, fatigue and poor po intake  Admitted through the ER with nausea, vomiting, diarrhea and dehydration.    Patient and his family face treatment option decisions, advanced directive decisions and the daily struggle of living with terminal disease.   Clinical Assessment and Goals of Care:   This NP [name] reviewed medical records, received report from team, assessed the patient and then meet at the patient's bedside to discuss diagnosis, prognosis, GOC,  and options.  Spoke to wife today multiple times today; processing the current  medicial situation but more specifically allowing patient and wife to verbalize their thoughts and fears understanding the seriousness of the disease.   I meet with [name] and Tyshan two weeks ago in the OP radiation oncology clinic for emotional support and help in navigating their decisions and options living with serious life limiting illness   Values and goals of care important to patient and family were elicited.   Patient's main focus of care at this time is prolonged "time".  He is open to all offered and available medical interventions to prolong life.  Again spoke to the patient regarding the  Importance  of continued conversation with family and his  medical providers regarding overall plan of care and  treatment options,  ensuring decisions are within the context of the patients values and GOCs.   Questions and concerns addressed.   Family encouraged to call with questions or concerns.    PMT will continue to support holistically.   NEXT OF KIN/wife [name] Neidert    SUMMARY OF RECOMMENDATIONS    Code Status/Advance Care Planning:  Full code   Symptom Management:   Pain is reported as "better"  as managed by oncology  Poor po intake:      -discussed nutritional strategies specific to grazing ( small frequent meals vs three main meals       - nutritionally dense foods      - importance of hydration, sip, sip, sip      -  plan is for [facility] this Friday.  Placed call to IR hoping to move procedure sooner but they are unable to 2/2 to full schedule.  Discussed possibility of PICC but will continue with peripheral IVs with care   [name] was focused on "the reason" for the patient's recent decline looking at radiation as sole cause.  We discussed side effects of radiation but also disease progression .  [name] is tearful and verbalizes her struggle with "letting the children know" and "not ready  to let him go"  Palliative Prophylaxis:   Aspiration, Bowel Regimen, Delirium Protocol, Frequent Pain Assessment and Oral Care  Additional Recommendations (Limitations, Scope, Preferences):  Full Scope Treatment- hopeful to start Chemotherapy ASAP  Psycho-social/Spiritual:   Desire for further Chaplaincy support:no   Prognosis:   Unable to determine  Discharge Planning: Home with Home Health      Primary Diagnoses: Present on Admission: . Dehydration . Anemia . Brain metastases ([place]) . CAD s/p PCI LAD 06/2013 [facility] . 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[place]) . Cancer related pain   I have reviewed the medical record, interviewed the patient and family, and examined the patient. The following aspects are pertinent.  Past Medical History:  Diagnosis Date  . Acute MI  ([place]) 2014   acute ST elevation MI  . Anemia   . Colon cancer ([place]) 2016   Status post resection of colon mass is well as he panic metastasis.   . Coronary artery disease   . Diabetes mellitus without complication ([place])   . Enlarged prostate    slightly and takes Flomax daily  . Heart disease   . History of blood transfusion   . Hyperlipidemia    takes Lipitor daily  . Hypertension    takes Lisinopril daily  . Lung nodule    left  . Nocturia   . Numbness    left foot  . Pancreatitis   . Peripheral neuropathy    Social History   Socioeconomic History  . Marital status: Married    Spouse name: [name]  . Number of children: 2  . Years of education: Bachelor  . Highest education level: Not on file  Occupational History  . Occupation: Company secretary  Social Needs  . Financial resource strain: Not on file  . Food insecurity:    Worry: Not on file    Inability: Not on file  . Transportation needs:    Medical: Not on file    Non-medical: Not on file  Tobacco Use  . Smoking status: Never Smoker  . Smokeless tobacco: Never Used  Substance and Sexual Activity  . Alcohol use: No    Alcohol/week: 0.0 oz  . Drug use: No  . Sexual activity: Not on file  Lifestyle  . Physical activity:    Days per week: Not on file    Minutes per session: Not on file  . Stress: Not on file  Relationships  . Social connections:    Talks on phone: Not on file    Gets together: Not on file    Attends religious service: Not on file    Active member of club or organization: Not on file    Attends meetings of clubs or organizations: Not on file    Relationship status: Not on file  Other Topics Concern  . Not on file  Social History Narrative   Live at home with wife, [name]   Patient is a [employer] manager, active and goes to gym   Caffeine: very little soda, occasional tea. 12oz-16oz/per   Family History  Problem Relation Age of Onset  . Hypertension Mother   . Heart disease Mother   .  Hypertension Father   . Heart disease Father   . Neuropathy Neg Hx    Scheduled Meds: . atorvastatin  10 mg Oral Daily  . dexamethasone  4 mg Oral BID  . dronabinol  2.5 mg Oral BID AC  . enoxaparin (LOVENOX) injection  40 mg Subcutaneous Q24H  . gabapentin  300 mg Oral BID  . gabapentin  300-600 mg Oral QHS  . insulin aspart  0-5 Units Subcutaneous QHS  . insulin aspart  0-9 Units Subcutaneous TID WC  . insulin glargine  12 Units Subcutaneous QHS  . mirtazapine  15 mg Oral QHS  . morphine  15 mg Oral Q12H  . pantoprazole  40 mg Oral Daily  . protein supplement shake  11 oz Oral BID BM  . tamsulosin  0.4 mg Oral QPC breakfast   Continuous Infusions: . sodium chloride 75 mL/hr at 07/16/18 0656   PRN Meds:.acetaminophen, methocarbamol, morphine, morphine injection, nitroGLYCERIN, ondansetron **OR** ondansetron (ZOFRAN) IV Medications Prior to Admission:  Prior to Admission medications   Medication Sig Start Date End Date Taking? Authorizing Provider  acetaminophen (TYLENOL) 500 MG tablet Take 500 mg by mouth every 6 (six) hours as needed (FOR PAIN/HEADACHES.).    Yes [provider]  atorvastatin (LIPITOR) 10 MG tablet Take 10 mg by mouth daily.    Yes [provider]  clotrimazole (MYCELEX) 10 MG troche Take 1 tablet (10 mg total) by mouth 4 (four) times daily. Take while on decadron 06/11/18  Yes Ladell Pier, MD  Coenzyme Q10 (COQ10) 200 MG CAPS Take 200 mg by mouth daily.   Yes [provider]  dexamethasone (DECADRON) 4 MG tablet Take 1 tablet (4 mg total) by mouth daily. 06/21/18  Yes Hayden Pedro, PA-C  gabapentin (NEURONTIN) 300 MG capsule Take 1 capsule (300 mg total) by mouth 3 (three) times daily. 1 to 2 PO QHS 06/25/18  Yes Hayden Pedro, PA-C  HUMALOG KWIKPEN 100 UNIT/ML KiwkPen Inject 2-15 Units into the skin See admin instructions. 121-150=2 UNITS, 151-200=3 UNITS, 201-250=5 UNITS, 251-300=8 UNITS, 301-350=11 UNITS,  351-400=15 UNITS 05/26/18  Yes [provider]  Insulin Glargine (BASAGLAR KWIKPEN) 100 UNIT/ML SOPN Inject 0.12 mLs (12 Units total) into the skin at bedtime. 05/26/18  Yes Mikhail, Velta Addison, DO  lisinopril (PRINIVIL,ZESTRIL) 2.5 MG tablet Take 2.5 mg by mouth daily. 02/16/15  Yes [provider]  methocarbamol (ROBAXIN) 500 MG tablet Take 500 mg by mouth every 8 (eight) hours as needed. 07/08/18  Yes [provider]  morphine (MS CONTIN) 30 MG 12 hr tablet Take 1 tablet (30 mg total) by mouth 3 (three) times daily. 07/02/18  Yes Ladell Pier, MD  morphine (MSIR) 15 MG tablet Take 1 tablet (15 mg total) by mouth every 4 (four) hours as needed for severe pain. 07/02/18  Yes Ladell Pier, MD  nitroGLYCERIN (NITROSTAT) 0.4 MG SL tablet Place 0.4 mg under the tongue every 5 (five) minutes as needed for chest pain. CALL 911 IF PAIN PERSISTS 11/24/14  Yes [provider]  omeprazole (PRILOSEC) 20 MG capsule Take 20 mg by mouth daily.   Yes [provider]  ondansetron (ZOFRAN-ODT) 4 MG disintegrating tablet Take 1 tablet (4 mg total) by mouth every 8 (eight) hours as needed for nausea or vomiting. 05/26/18  Yes Mikhail, Velta Addison, DO  tamsulosin (FLOMAX) 0.4 MG CAPS capsule Take 0.4 mg by mouth daily after breakfast. 30 minutes after breakfast 02/24/15  Yes [provider]  trolamine salicylate (ASPERCREME) 10 % cream Apply 1 application topically 2 (two) times daily as needed (FOR BACK PAIN.).   Yes [provider]  Continuous Blood Gluc Sensor (FREESTYLE LIBRE 14 DAY SENSOR) MISC 1 application by Does not apply route every 14 (fourteen) days. 06/06/18   Mikhail, Velta Addison, DO  Insulin Pen Needle 31G X  5 MM MISC Use with insulin pen. 05/26/18   Mikhail, Velta Addison, DO  methocarbamol (ROBAXIN) 750 MG tablet Take 1 tablet (750 mg total) by mouth every 8 (eight) hours as needed for muscle spasms. Patient not taking: Reported on 07/14/2018 05/26/18   Cristal Ford, DO  mirtazapine (REMERON) 7.5 MG tablet Take 1 tablet (7.5 mg total) by mouth at bedtime. 07/14/18   Tanner, Lyndon Code., PA-C  zolpidem (AMBIEN) 5 MG tablet Take 1 tablet (5 mg total) by mouth at bedtime as needed for sleep. Patient not taking: Reported on 07/14/2018 05/26/18   Cristal Ford, DO   No Known Allergies Review of Systems  Constitutional: Positive for fatigue.  Gastrointestinal: Positive for nausea.  Neurological: Positive for weakness.    Physical Exam  Constitutional: He appears lethargic. He appears ill.  Pulmonary/Chest: Effort normal.  Musculoskeletal:  -generalized weakness  Neurological: He appears lethargic.  Skin: Skin is warm and dry.    Vital Signs: BP (!) 148/70 (BP Location: Right Arm)   Pulse 93   Temp 97.6 F (36.4 C) (Oral)   Resp 20   Wt 84.9 kg (187 lb 1.6 oz)   SpO2 98%   BMI 24.02 kg/m  Pain Scale: 0-10   Pain Score: Asleep   SpO2: SpO2: 98 % O2 Device:SpO2: 98 % O2 Flow Rate: .   IO: Intake/output summary:   Intake/Output Summary (Last 24 hours) at 07/16/2018 1306 Last data filed at 07/16/2018 0930 Gross per 24 hour  Intake 891.67 ml  Output 550 ml  Net 341.67 ml    LBM: Last BM Date: 07/14/18 Baseline Weight: Weight: 84.9 kg (187 lb 1.6 oz) Most recent weight: Weight: 84.9 kg (187 lb 1.6 oz)     Palliative Assessment/Data: 30%     Time In: 1500 Time Out: 1615 Time Total: 75 minutes Greater than 50%  of this time was spent counseling and coordinating care related to the above assessment and plan.  Signed by: [name], NP   Please contact Palliative Medicine Team phone at [phone] for questions and concerns.  For individual provider: See Shea Evans

## 2018-07-16 NOTE — Progress Notes (Signed)
IP PROGRESS NOTE  Subjective:   [name] reports no further diarrhea.  The nausea has improved.  He was able to eat a partial diet yesterday.  He is ambulating.  No balance difficulty.  Objective: Vital signs in last 24 hours: Blood pressure (!) 148/70, pulse 93, temperature 97.6 F (36.4 C), temperature source Oral, resp. rate 20, weight 187 lb 1.6 oz (84.9 kg), SpO2 98 %.  Intake/Output from previous day: 07/23 0701 - 07/24 0700 In: 871.7 [P.O.:320; I.V.:551.7] Out: 750 [Urine:750]  Physical Exam:  HEENT: No thrush  Abdomen: Nontender, no mass, no hepatomegaly Extremities: No leg edema  Lab Results: Recent Labs    07/15/18 0551 07/16/18 0635  WBC 2.9* 2.7*  HGB 12.9* 12.1*  HCT 38.5* 35.8*  PLT 143* 130*    BMET Recent Labs    07/15/18 0551 07/16/18 0635  NA 131* 131*  K 4.6 4.2  CL 101 102  CO2 23 20*  GLUCOSE 274* 195*  BUN 13 15  CREATININE 0.43* 0.45*  CALCIUM 7.5* 7.5*    Lab Results  Component Value Date   CEA1 31.01 (H) 07/02/2018    Studies/Results: Dg Chest Port 1 View  Result Date: 07/14/2018 CLINICAL DATA:  Weakness, abdominal pain, nausea EXAM: PORTABLE CHEST 1 VIEW COMPARISON:  Chest CT 02/25/2018 FINDINGS: Heart is normal size. Soft tissue fullness in the left hilar/infrahilar region. Cannot exclude adenopathy or enlarging mass. Right lung clear. No effusions. No acute bony abnormality. IMPRESSION: Soft tissue fullness in the left hilar/infrahilar region. Cannot exclude adenopathy or mass. This could be further evaluated with chest CT with IV contrast. Electronically Signed   By: Rolm Baptise M.D.   On: 07/14/2018 18:56   [name] Ekg Site Rite  Result Date: 07/14/2018 If Site Rite image not attached, placement could not be confirmed due to current cardiac rhythm.   Medications: I have reviewed the patient's current medications.  Assessment/Plan: 1. 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 No BRAFmutation  Liver biopsy 05/04/2015-metastatic adenocarcinoma consistent with a colon primary ? Staging PET scan 06/08/2015-isolated segment 4A liver lesion ? Initiation of adjuvant FOLFOX 06/13/2015 ? Restaging CT 08/09/2015 revealed a slight decrease in a borderline ileocolic node, decrease in the hepatic dome metastasis, no new lesions ? Liver resection 09/28/2015-pathology consistent with metastatic colon cancer, negative margins ? Adjuvant FOLFOX resumed 11/08/2015, oxaliplatin eliminated beginning 11/22/2015 secondary to neuropathy. He completed adjuvant chemotherapy 02/16/2016 ? Restaging chest CT 11/08/2016, compared to 05/07/2016 revealed a new 9 mm left upper lobe nodule, stable 2.2 cm right hepatic lesion ? PET scan 11/19/2016 confirmed a hypermetabolic left upper lobe nodule, hypermetabolic left paratracheal and pericardiac lymph nodes, and hypermetabolism associated with the hypoattenuating lesion in the dome of the liver ? Status post EBUSbiopsies of the left lingula nodule and a level 4Lnode on 11/28/2016-no evidence of malignancy ? CT chest 02/11/2017-increase in size of the left pulmonary nodule and epicardial lymph node ? Status post SBRT 2 the left lung nodule and mediastinum completed 03/14/2017 ? CTs 07/01/2017-new 9 mm focus along the right liver capsule, stable left subcapsular liver lesion, radiation change at site of left upper lobe nodule, stable pericardiallymph node ? CTs 11/25/2017-new 5 mm lingular nodule, enlargement of capsular-based right liver lesion, new capsular based right liver lesion capsular lesion at the hepatic dome ? CTs 02/25/2018-enlargement of small lung nodules, liver lesions and small mesenteric lymph node ? CT abdomen/pelvis  05/15/2018-new lower lobe pulmonary nodules, enlargement of liver lesions and right lower quadrant soft tissue nodules ? CT  brain 05/23/2018-solitary left cerebellar metastasis with edema and narrowing of the fourth ventricle ? Brain MRI 05/26/2018- 3.5 similar left posterior fossa mass, 4 additional subcentimeter enhancing brain lesions-3 cerebellar, 1 left thalamic ? SRS to all 5 brain lesions 06/02/2018 ? Surgical excision of left cerebellar lesion 06/04/2018, adenocarcinoma consistent with colorectal primary ? MRI lumbar spine 06/18/2018-extensive metastases to the lumbar spine, sacrum, T12, and pelvis, pathologic fracture of L3 ? Initiation of radiation from T12- S2 06/30/2018, completed 07/11/2018 2. Coronary artery disease status post a myocardial infarction in 2014  3. Hypertension  4. Hyperlipidemia  5. Diabetes  6. Admission 05/20/2018 with nausea/vomitingsecondary to a cerebellar metastasis  7.Oral candidiasis 06/11/2018- started on Mycelex atrocious  8.   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  He will advance his diet as tolerated today.  The back and left leg pain are significantly improved following the course of palliative radiation.  I will taper the MS Contin to twice daily.  I contacted interventional radiology.  They will try to place the Port-A-Cath today or tomorrow.  I discussed the plan for FOLFIRI chemotherapy with Allen Glenn and his wife.  The first cycle of FOLFIRI will be delayed until 07/29/2018.   Recommendations: 1.  Advance diet and ambulation as tolerated 2.  Port-A-Cath placement 3.  Decrease MS Contin to twice daily 4.  Outpatient follow-up will be scheduled at the Cancer center for an office visit and chemotherapy 07/29/2018        LOS: 2 days   Betsy Coder, MD   07/16/2018, 8:32 AM

## 2018-07-17 ENCOUNTER — Encounter (HOSPITAL_COMMUNITY): Payer: Self-pay

## 2018-07-17 ENCOUNTER — Other Ambulatory Visit: Payer: Self-pay | Admitting: Radiation Oncology

## 2018-07-17 DIAGNOSIS — G43A1 Cyclical vomiting, intractable: Secondary | ICD-10-CM

## 2018-07-17 DIAGNOSIS — R638 Other symptoms and signs concerning food and fluid intake: Secondary | ICD-10-CM

## 2018-07-17 DIAGNOSIS — W888XXA Exposure to other ionizing radiation, initial encounter: Secondary | ICD-10-CM

## 2018-07-17 DIAGNOSIS — D61818 Other pancytopenia: Secondary | ICD-10-CM | POA: Diagnosis present

## 2018-07-17 LAB — CBC
HEMATOCRIT: 40.9 % (ref 39.0–52.0)
Hemoglobin: 13.8 g/dL (ref 13.0–17.0)
MCH: 29.7 pg (ref 26.0–34.0)
MCHC: 33.7 g/dL (ref 30.0–36.0)
MCV: 88 fL (ref 78.0–100.0)
PLATELETS: 140 10*3/uL — AB (ref 150–400)
RBC: 4.65 MIL/uL (ref 4.22–5.81)
RDW: 14.8 % (ref 11.5–15.5)
WBC: 3.4 10*3/uL — ABNORMAL LOW (ref 4.0–10.5)

## 2018-07-17 LAB — BASIC METABOLIC PANEL
Anion gap: 7 (ref 5–15)
BUN: 17 mg/dL (ref 8–23)
CALCIUM: 7.7 mg/dL — AB (ref 8.9–10.3)
CO2: 25 mmol/L (ref 22–32)
Chloride: 99 mmol/L (ref 98–111)
Creatinine, Ser: 0.46 mg/dL — ABNORMAL LOW (ref 0.61–1.24)
GFR calc Af Amer: >60 mL/min (ref >60)
GLUCOSE: 174 mg/dL — AB (ref 70–99)
Potassium: 4.2 mmol/L (ref 3.5–5.1)
Sodium: 131 mmol/L — ABNORMAL LOW (ref 135–145)

## 2018-07-17 LAB — GLUCOSE, CAPILLARY
GLUCOSE-CAPILLARY: 153 mg/dL — AB (ref 70–99)
Glucose-Capillary: 202 mg/dL — ABNORMAL HIGH (ref 70–99)
Glucose-Capillary: 255 mg/dL — ABNORMAL HIGH (ref 70–99)

## 2018-07-17 MED ORDER — ONDANSETRON HCL 4 MG PO TABS: 4.0000 mg | ORAL_TABLET | Freq: Three times a day (TID) | ORAL | Status: DC | PRN

## 2018-07-17 MED ORDER — INSULIN ASPART 100 UNIT/ML ~~LOC~~ SOLN
0.0000 [IU] | Freq: Every day | SUBCUTANEOUS | Status: DC
  Administered 2018-07-17: 2 [IU] via SUBCUTANEOUS

## 2018-07-17 MED ORDER — INSULIN ASPART 100 UNIT/ML ~~LOC~~ SOLN
0.0000 [IU] | Freq: Three times a day (TID) | SUBCUTANEOUS | Status: DC
  Administered 2018-07-18 (×2): 5 [IU] via SUBCUTANEOUS

## 2018-07-17 MED ORDER — ONDANSETRON 4 MG PO TBDP
4.0000 mg | ORAL_TABLET | Freq: Three times a day (TID) | ORAL | Status: DC | PRN
  Administered 2018-07-17 (×2): 4 mg via ORAL
  Filled 2018-07-17 (×2): qty 1

## 2018-07-17 MED ORDER — ONDANSETRON HCL 4 MG/2ML IJ SOLN: 4.0000 mg | Freq: Three times a day (TID) | INTRAMUSCULAR | Status: DC | PRN

## 2018-07-17 MED ORDER — SODIUM CHLORIDE 0.9 % IV SOLN
INTRAVENOUS | Status: DC
  Administered 2018-07-17: 22:00:00 via INTRAVENOUS

## 2018-07-17 NOTE — Progress Notes (Signed)
Physical Therapy Treatment Patient Details Name: Allen Glenn MRN: [id number] DOB: 25-Mar-1956 Today's Date: 07/17/2018    History of Present Illness 62 y.o. male with medical history significant of lumbar compression fx late June 2019,  hypertension, diabetes mellitus type 2, BPH, history of diastolic CHF, hyperlipidemia, CAD, metastatic colon cancer with brain metastasis as well as thoracic disease who went to his oncologist today for follow-up and was feeling poorly for the last week. Pt admitted with nausea/vomiting/diarrhea and dehydration.     PT Comments    Pt is progressing well with mobility, he tolerated an increased distance of 450' with ambulation, no loss of balance. HR 90s while walking.  From PT standpoint, he is ready to DC home.  Follow Up Recommendations  No PT follow up     Equipment Recommendations  None recommended by PT    Recommendations for Other Services       Precautions / Restrictions Precautions Precautions: Fall Precaution Comments: pt with back brace in room Restrictions Weight Bearing Restrictions: No    Mobility  Bed Mobility   Bed Mobility: Supine to Sit     Supine to sit: Modified independent (Device/Increase time)     General bed mobility comments: used rail  Transfers Overall transfer level: Needs assistance Equipment used: Rolling walker (2 wheeled) Transfers: Sit to/from Stand Sit to Stand: From elevated surface;Supervision         General transfer comment: bed elevated, good hand placement  Ambulation/Gait Ambulation/Gait assistance: Supervision Gait Distance (Feet): 450 Feet Assistive device: Rolling walker (2 wheeled) Gait Pattern/deviations: WFL(Within Functional Limits) Gait velocity: decr   General Gait Details: steady, no loss of balance, VCs to stay close to RW when turning   Stairs             Wheelchair Mobility    Modified Rankin (Stroke Patients Only)       Balance   Sitting-balance support:  Feet supported;No upper extremity supported Sitting balance-Leahy Scale: Good       Standing balance-Leahy Scale: Fair                              Cognition Arousal/Alertness: Awake/alert Behavior During Therapy: WFL for tasks assessed/performed Overall Cognitive Status: Within Functional Limits for tasks assessed                                        Exercises      General Comments        Pertinent Vitals/Pain Pain Assessment: No/denies pain    Home Living                      Prior Function            PT Goals (current goals can now be found in the care plan section) Acute Rehab PT Goals Patient Stated Goal: regain strength, work in yard PT Goal Formulation: With patient/family Time For Goal Achievement: 07/22/18 Potential to Achieve Goals: Good Progress towards PT goals: Progressing toward goals    Frequency    Min 3X/week      PT Plan Current plan remains appropriate    Co-evaluation              AM-PAC PT "6 Clicks" Daily Activity  Outcome Measure  Difficulty turning over in bed (including adjusting bedclothes, sheets and  blankets)?: A Little Difficulty moving from lying on back to sitting on the side of the bed? : A Little Difficulty sitting down on and standing up from a chair with arms (e.g., wheelchair, bedside commode, etc,.)?: A Little Help needed moving to and from a bed to chair (including a wheelchair)?: A Little Help needed walking in hospital room?: A Little Help needed climbing 3-5 steps with a railing? : A Little 6 Click Score: 18    End of Session Equipment Utilized During Treatment: Gait belt Activity Tolerance: Patient tolerated treatment well Patient left: with call bell/phone within reach;with family/visitor present;in chair Nurse Communication: Mobility status PT Visit Diagnosis: Muscle weakness (generalized) (M62.81);Difficulty in walking, not elsewhere classified (R26.2)      Time: 3437-3578 PT Time Calculation (min) (ACUTE ONLY): 17 min  Charges:  $Gait Training: 8-22 mins                    G Codes:          Philomena Doheny 07/17/2018, 9:54 AM [phone]

## 2018-07-17 NOTE — Progress Notes (Signed)
PROGRESS NOTE  ROSSER COLLINGTON ZDG:[id number] DOB: [birth year] DOA: 07/14/2018 PCP: [name], MD  HPI  Allen Glenn is a 62 y.o. year old male with medical history significant for HTN, type 2 diabetes, BPH, diastolic CHF, HLD, CAD, metastatic colon cancer with brain mets who presented on 07/14/2018 as a direct admission from his oncology office.  He has had progressive fatigue diminished appetite since completing radiation therapy a week ago.  He was evaluated by his oncology team prior to admission at the office with concern for dehydration however was unable to place an IV line was directed to [facility] long for admission supportive care of nausea, vomiting, diarrhea in setting of dehydration and diminished p.o. intake.      Subjective Did well eating overnight.  Decreased appetite this morning.  Walking in the halls okay.  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 intake.  Oncology [name] following  Dehydration persistent nausea/vomiting and diminished p.o. intake.  Creatinine remains normal.  Continue supportive care with IV fluids.  Plan to obtain Port-A-Cath during this admission.  Metastatic colon cancer with brain metastasis (status post left cerebellar excision 06/04/2018) and extensive metastasis to lumbar, sacrum, T12, pelvis Completed palliative radiation therapy 7/8-7/19/2019.  Oncology recommends tapering MS Contin twice daily, continue morphine immediate release 15 mg every 4 as needed.   Follow-up as outpatient on 07/29/2018  Pancytopenia, improving.  Hemoglobin back at baseline of normal.  Platelets hanging run in the 140s.  White count slowly increasing currently at 3.4.  ( baseline 5-11),  No current signs of bleeding.  Monitor CBC  Hyponatremia, resolved.  Likely hypovolemic hyponatremia in setting of decreased p.o. intake.  Improved with IV fluids.  Continue to monitor.  CAD.  Continue atorvastatin and nitro as needed.   BPH, stable Continue tamsulosin  GERD, stable Continue Protonix  Hypertension, at goal after resuming home lisinopril.  Continue to monitor.   Type 2 diabetes.  A1c 7.9% (05/2018).  Glucose in the 250s.  Increase sliding scale sensitivity to moderate range in the setting of Decadron use.  Continue to monitor.  Continue Lantus 12 units.    Code Status: Full code  Family Communication: Wife and daughter at bedside  Disposition Plan: Port-A-Cath for access for future chemotherapy on 7/26, continue to monitor pain control, improvement in oral intake, likely discharge in 24 hours.   Consultants:  Treatment Team:   Ladell Pier, MD    Procedures:  none  Antimicrobials: Anti-infectives (From admission, onward)   None        Cultures:  None  Telemetry:  DVT prophylaxis: Lovenox   Objective: Vitals:   07/16/18 1409 07/16/18 2143 07/17/18 0539 07/17/18 1437  BP: (!) 149/81 (!) 143/85 (!) 145/80 126/74  Pulse: 80 83 92 95  Resp: 18 18 18    Temp: 97.9 F (36.6 C) 98.4 F (36.9 C) 97.8 F (36.6 C) 97.6 F (36.4 C)  TempSrc: Oral Oral  Oral  SpO2: 99% 98% 99% 97%  Weight:  No intake or output data in the 24 hours ending 07/17/18 1837 Filed Weights   07/15/18 0500  Weight: 84.9 kg (187 lb 1.6 oz)    Exam:  Constitutional: Chronically ill-appearing male ENMT: Oropharynx with moist mucous membranes, normal dentition Cardiovascular: RRR no MRGs, with no peripheral edema Respiratory: Normal respiratory effort on  room air, clear breath sounds  Abdomen: Soft,non-tender, with no HSM Skin: No rash ulcers, or lesions. Without skin tenting  Neurologic: Grossly no focal neuro deficit. Psychiatric:Appropriate affect, and mood. Mental status AAOx3  Data Reviewed: CBC: Recent Labs  Lab 07/14/18 1033 07/14/18 1737 07/15/18 0551 07/16/18 0635 07/17/18 0858  WBC 3.0* 2.8* 2.9* 2.7* 3.4*  NEUTROABS 2.0  --  2.5  --   --   HGB 14.8 14.5 12.9* 12.1* 13.8  HCT 43.2 42.7 38.5* 35.8* 40.9  MCV 86.4 85.9 87.1 86.9 88.0  PLT 108* 159 143* 130* 332*   Basic Metabolic Panel: Recent Labs  Lab 07/14/18 1033 07/14/18 1737 07/15/18 0551 07/16/18 0635 07/17/18 0858  NA 127*  --  131* 131* 131*  K 4.6  --  4.6 4.2 4.2  CL 97*  --  101 102 99  CO2 21*  --  23 20* 25  GLUCOSE 185*  --  274* 195* 174*  BUN 16  --  13 15 17   CREATININE 0.58* 0.57* 0.43* 0.45* 0.46*  CALCIUM 8.3*  --  7.5* 7.5* 7.7*  MG 2.1  --   --   --   --    GFR: Estimated Creatinine Clearance: 112.7 mL/min (A) (by C-G formula based on SCr of 0.46 mg/dL (L)). Liver Function Tests: Recent Labs  Lab 07/14/18 1033 07/15/18 0551  AST 15 13*  ALT 30 23  ALKPHOS 247* 160*  BILITOT 0.4 0.3  PROT 5.4* 4.5*  ALBUMIN 2.0* 1.8*   No results for input(s): LIPASE, AMYLASE in the last 168 hours. No results for input(s): AMMONIA in the last 168 hours. Coagulation Profile: No results for input(s): INR, PROTIME in the last 168 hours. Cardiac Enzymes: No results for input(s): CKTOTAL, CKMB, CKMBINDEX, TROPONINI in the last 168 hours. BNP (last 3 results) No results for input(s): PROBNP in the last 8760 hours. HbA1C: No results for input(s): HGBA1C in the last 72 hours. CBG: Recent Labs  Lab 07/16/18 1651 07/16/18 2145 07/17/18 0729 07/17/18 1116 07/17/18 1648  GLUCAP 244* 214* 153* 202* 255*   Lipid Profile: No results for input(s): CHOL, HDL, LDLCALC, TRIG, CHOLHDL, LDLDIRECT in the last 72 hours. Thyroid Function Tests: Recent  Labs    07/15/18 0551  TSH 0.380   Anemia Panel: No results for input(s): VITAMINB12, FOLATE, FERRITIN, TIBC, IRON, RETICCTPCT in the last 72 hours. Urine analysis:    Component Value Date/Time   COLORURINE YELLOW 05/19/2018 1825   APPEARANCEUR CLEAR 05/19/2018 1825   LABSPEC >1.030 (H) 05/19/2018 1825   PHURINE 6.0 05/19/2018 1825   GLUCOSEU NEGATIVE 05/19/2018 1825   HGBUR NEGATIVE 05/19/2018 1825   BILIRUBINUR NEGATIVE 05/19/2018 1825   KETONESUR 15 (A) 05/19/2018 1825   PROTEINUR NEGATIVE 05/19/2018 1825   NITRITE NEGATIVE 05/19/2018 1825   LEUKOCYTESUR NEGATIVE 05/19/2018 1825   Sepsis Labs: @LABRCNTIP (procalcitonin:4,lacticidven:4)  )No results found for this or any previous visit (from the past 240 hour(s)).    Studies: No results found.  Scheduled Meds: . atorvastatin  10 mg Oral Daily  . dexamethasone  4 mg Oral BID  . dronabinol  2.5 mg Oral BID AC  . gabapentin  300  mg Oral BID  . gabapentin  300-600 mg Oral QHS  . insulin aspart  0-5 Units Subcutaneous QHS  . insulin aspart  0-9 Units Subcutaneous TID WC  . insulin glargine  12 Units Subcutaneous QHS  . lisinopril  2.5 mg Oral Daily  . mirtazapine  15 mg Oral QHS  . morphine  15 mg Oral Q12H  . pantoprazole  40 mg Oral Daily  . protein supplement shake  11 oz Oral BID BM  . tamsulosin  0.4 mg Oral QPC breakfast    Continuous Infusions: . sodium chloride 75 mL/hr at 07/16/18 2048     LOS: 3 days     Desiree Hane, MD [facility] Pager [phone]  If 7PM-7AM, please contact night-coverage [email or web address] Password [facility] 07/17/2018, 6:37 PM

## 2018-07-17 NOTE — Progress Notes (Signed)
OT Cancellation Note  Patient Details Name: KEZIAH DROTAR MRN: [id number] DOB: 02/22/1956   Cancelled Treatment:    Reason Eval/Treat Not Completed: Other (comment). Spoke to PT. Pt is very high functioning and doesn't need further OT. Will sign off.  Ahniya Mitchum 07/17/2018, 10:04 AM  Lesle Chris, OTR/L [phone] 07/17/2018

## 2018-07-17 NOTE — Progress Notes (Signed)
Inpatient Diabetes Program Recommendations  AACE/ADA: New Consensus Statement on Inpatient Glycemic Control (2015)  Target Ranges:  Prepandial:   less than 140 mg/dL      Peak postprandial:   less than 180 mg/dL (1-2 hours)      Critically ill patients:  140 - 180 mg/dL   Results for TYLIK, TREESE (MRN [id number]) as of 07/17/2018 12:12  Ref. Range 07/16/2018 07:29 07/16/2018 11:32 07/16/2018 16:51 07/16/2018 21:45  Glucose-Capillary Latest Ref Range: 70 - 99 mg/dL 211 (H) 246 (H) 244 (H) 214 (H)    Results for DEMARQUEZ, CIOLEK (MRN [id number]) as of 07/17/2018 12:12  Ref. Range 07/17/2018 07:29 07/17/2018 11:16  Glucose-Capillary Latest Ref Range: 70 - 99 mg/dL 153 (H) 202 (H)   Home DM Meds: Basaglar 12 units QHS                             Humalog 2-15 units TID per SSI  Current Insulin Orders: Lantus 12 units QHS                                       Novolog Sensitive Correction Scale/ SSI (0-9 units) TID AC + HS       Note patient getting Decadron 4 mg BID.  CBGs consistently elevated >200 mg/dl.      MD- Please consider the following in-hospital insulin adjustments:  Increase Novolog SSI to Moderate scale (0-15 units) TID AC + HS      --Will follow patient during hospitalization--  Wyn Quaker RN, MSN, CDE Diabetes Coordinator Inpatient Glycemic Control Team Team Pager: [phone] (8a-5p)

## 2018-07-18 ENCOUNTER — Inpatient Hospital Stay (HOSPITAL_COMMUNITY): Payer: 59

## 2018-07-18 ENCOUNTER — Encounter (HOSPITAL_COMMUNITY): Payer: Self-pay | Admitting: Interventional Radiology

## 2018-07-18 ENCOUNTER — Other Ambulatory Visit: Payer: Self-pay | Admitting: *Deleted

## 2018-07-18 DIAGNOSIS — G43A0 Cyclical vomiting, not intractable: Secondary | ICD-10-CM

## 2018-07-18 DIAGNOSIS — E1165 Type 2 diabetes mellitus with hyperglycemia: Secondary | ICD-10-CM

## 2018-07-18 HISTORY — PX: IR IMAGING GUIDED PORT INSERTION: IMG5740

## 2018-07-18 LAB — GLUCOSE, CAPILLARY
GLUCOSE-CAPILLARY: 205 mg/dL — AB (ref 70–99)
Glucose-Capillary: 247 mg/dL — ABNORMAL HIGH (ref 70–99)
Glucose-Capillary: 229 mg/dL — ABNORMAL HIGH (ref 70–99)
Glucose-Capillary: 212 mg/dL — ABNORMAL HIGH (ref 70–99)

## 2018-07-18 LAB — CBC
HCT: 37 % — ABNORMAL LOW (ref 39.0–52.0)
Hemoglobin: 12.5 g/dL — ABNORMAL LOW (ref 13.0–17.0)
MCH: 29.2 pg (ref 26.0–34.0)
MCHC: 33.8 g/dL (ref 30.0–36.0)
MCV: 86.4 fL (ref 78.0–100.0)
PLATELETS: 117 10*3/uL — AB (ref 150–400)
RBC: 4.28 MIL/uL (ref 4.22–5.81)
RDW: 14.8 % (ref 11.5–15.5)
WBC: 4.4 10*3/uL (ref 4.0–10.5)

## 2018-07-18 LAB — PROTIME-INR
INR: 0.97
Prothrombin Time: 12.8 seconds (ref 11.4–15.2)

## 2018-07-18 MED ORDER — HEPARIN SOD (PORK) LOCK FLUSH 100 UNIT/ML IV SOLN
INTRAVENOUS | Status: AC | PRN
  Administered 2018-07-18: 500 [IU]

## 2018-07-18 MED ORDER — ENOXAPARIN SODIUM 40 MG/0.4ML ~~LOC~~ SOLN: 40.0000 mg | SUBCUTANEOUS | Status: DC

## 2018-07-18 MED ORDER — FENTANYL CITRATE (PF) 100 MCG/2ML IJ SOLN
INTRAMUSCULAR | Status: AC
  Filled 2018-07-18: qty 4

## 2018-07-18 MED ORDER — MIDAZOLAM HCL 2 MG/2ML IJ SOLN
INTRAMUSCULAR | Status: AC | PRN
  Administered 2018-07-18 (×2): 1 mg via INTRAVENOUS

## 2018-07-18 MED ORDER — ONDANSETRON 4 MG PO TBDP: 4.0000 mg | ORAL_TABLET | Freq: Three times a day (TID) | ORAL | 0 refills | Status: DC | PRN

## 2018-07-18 MED ORDER — FLUMAZENIL 0.5 MG/5ML IV SOLN
INTRAVENOUS | Status: AC
  Filled 2018-07-18: qty 5

## 2018-07-18 MED ORDER — DRONABINOL 2.5 MG PO CAPS: 2.5000 mg | ORAL_CAPSULE | Freq: Two times a day (BID) | ORAL | 0 refills | Status: DC

## 2018-07-18 MED ORDER — MIDAZOLAM HCL 2 MG/2ML IJ SOLN
INTRAMUSCULAR | Status: AC
  Filled 2018-07-18: qty 4

## 2018-07-18 MED ORDER — LIDOCAINE-PRILOCAINE 2.5-2.5 % EX CREA: 1.0000 "application " | TOPICAL_CREAM | CUTANEOUS | 0 refills | Status: DC | PRN

## 2018-07-18 MED ORDER — FENTANYL CITRATE (PF) 100 MCG/2ML IJ SOLN
INTRAMUSCULAR | Status: AC | PRN
  Administered 2018-07-18 (×2): 50 ug via INTRAVENOUS

## 2018-07-18 MED ORDER — HEPARIN SOD (PORK) LOCK FLUSH 100 UNIT/ML IV SOLN
INTRAVENOUS | Status: AC
  Filled 2018-07-18: qty 5

## 2018-07-18 MED ORDER — MIRTAZAPINE 15 MG PO TABS: 15.0000 mg | ORAL_TABLET | Freq: Every day | ORAL | 0 refills | Status: DC

## 2018-07-18 MED ORDER — CEFAZOLIN SODIUM-DEXTROSE 2-4 GM/100ML-% IV SOLN
INTRAVENOUS | Status: AC
  Administered 2018-07-18: 2000 mg
  Filled 2018-07-18: qty 100

## 2018-07-18 MED ORDER — LIDOCAINE-EPINEPHRINE (PF) 2 %-1:200000 IJ SOLN
INTRAMUSCULAR | Status: AC | PRN
  Administered 2018-07-18: 20 mL

## 2018-07-18 MED ORDER — DEXAMETHASONE 4 MG PO TABS: 4.0000 mg | ORAL_TABLET | Freq: Two times a day (BID) | ORAL | 0 refills | Status: DC

## 2018-07-18 MED ORDER — NALOXONE HCL 0.4 MG/ML IJ SOLN
INTRAMUSCULAR | Status: AC
  Filled 2018-07-18: qty 1

## 2018-07-18 MED ORDER — LIDOCAINE-EPINEPHRINE (PF) 2 %-1:200000 IJ SOLN
INTRAMUSCULAR | Status: AC
  Filled 2018-07-18: qty 20

## 2018-07-18 MED FILL — MORPHINE SULF ER 15 MG TAB: 15 | 15 days supply | Qty: 30 | Fill #0

## 2018-07-18 MED FILL — DRONABINOL 2.5 MG CAPSULE: 2.5 | 30 days supply | Qty: 60 | Fill #0

## 2018-07-18 MED FILL — LIDOCAINE-PRILOCAINE CREAM: 2.5-2.5 | 7 days supply | Qty: 30 | Fill #0

## 2018-07-18 MED FILL — MIRTAZAPINE 7.5 MG TABLET: 7.5 | 30 days supply | Qty: 30 | Fill #0 | Status: TO

## 2018-07-18 MED FILL — ONDANSETRON ODT 4 MG TABLET: 4 | 15 days supply | Qty: 45 | Fill #0

## 2018-07-18 MED FILL — DEXAMETHASONE 4 MG TABLET: 4 | 30 days supply | Qty: 60 | Fill #0

## 2018-07-18 NOTE — Progress Notes (Signed)
PT Cancellation Note  Patient Details Name: Allen Glenn MRN: [id number] DOB: 10-Jan-1956   Cancelled Treatment:    Reason Eval/Treat Not Completed: Other (comment) Pt reports mobilizing well and has all equipment at home.  Per PT note yesterday, appears ready for d/c from PT standpoint.  NO f/u was recommended.  Pt reports d/c home today.   Jameeka Marcy,KATHrine E 07/18/2018, 10:40 AM Carmelia Bake, PT, DPT 07/18/2018 Pager: [phone]

## 2018-07-18 NOTE — Progress Notes (Signed)
MEDICATION-RELATED CONSULT NOTE   IR Procedure Consult - Anticoagulant/Antiplatelet PTA/Inpatient Med List Review by Pharmacist   Procedure: Port-a-cath placement Completed: 7/26 09:24  Post-Procedural bleeding risk per IR MD assessment:  Standard  Antithrombotic medications on inpatient or PTA profile prior to procedure:  Lovenox 40 mg SQ q24h, last dose on 7/24 2155    Recommended restart time per IR Post-Procedure Guidelines:  Day +1   Plan:     Resume Lovenox 40 mg SQ q24h on 7/27 at 10:00 if not discharged today.

## 2018-07-18 NOTE — Progress Notes (Signed)
Patient has discharged to home on 07/18/18. Discharge instruction including medication and appointment was given to patient and family member at bedside. Patient has no question at this time.

## 2018-07-18 NOTE — Progress Notes (Signed)
Patient went to IR for port- a- cath at 0800.

## 2018-07-18 NOTE — Discharge Summary (Addendum)
Discharge Summary  Allen Glenn OBS:[id number] DOB: 1956-12-20  PCP: [name], MD  Admit date: 07/14/2018 Discharge date: 07/18/2018   Time spent: < 25 minutes  Admitted From: Home Disposition: Home  Recommendations for Outpatient Follow-up:  1. Follow up with PCP in 1 to 2 weeks 2. Advised to hold aspirin until follow-up with oncology (monitor CBC/platelets)     Discharge Diagnoses:  Active Hospital Problems   Diagnosis Date Noted  . Pancytopenia ([place]) 07/17/2018  . Decreased oral intake   . Dehydration 07/14/2018  . Generalized weakness 07/14/2018  . Cancer related pain   . Anemia 06/05/2018  . 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 Hypertension 06/05/2018  . Hyperlipidemia 06/05/2018  . Insulin dependent type 2 diabetes mellitus, uncontrolled ([place]) 06/05/2018  . Brain metastases ([place]) 06/02/2018  . CAD s/p PCI LAD 06/2013 [facility] 04/09/2015  . Nausea & vomiting 04/09/2015    Resolved Hospital Problems  No resolved problems to display.    Discharge Condition: Stable  CODE STATUS: Full code  History of present illness:  Allen Glenn is a 62 y.o. year old male with medical history significant for HTN, type 2 diabetes, BPH, diastolic CHF, HLD, CAD, metastatic colon cancer with brain mets who presented on 07/14/2018 as a direct admission from his oncology office.  He has had progressive fatigue diminished appetite since completing radiation therapy a week ago.  He was evaluated by his oncology team prior to admission at the office with concern for dehydration however was unable to place an IV line was directed to [facility] long for admission supportive care of nausea, vomiting, diarrhea in setting of dehydration and diminished p.o. intake.      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 - Supportive care with IV fluids, IV antiemetics.  -Tolerated transition to oral diet  -Appreciate oncology [name]  following throughout hospital stay. On discharge we will continue oral antiemetics as needed  Dehydration related to nausea vomiting and diminished p.o. Intake, resolved -Creatinine remained normal throughout hospital stay. - Initial supportive care with IV fluids was able to maintain some minimal oral intake. -Added Marinol and doubled home dose of Remeron for appetite stimulation  Pancytopenia, improving. -Drop in all counts likely related to recent radiation. -Hemoglobin back at baseline -Platelets still in the 1 teens, advised patient to hold aspirin until follow-up with oncologist -White count returned to normal parameters. -No signs or symptoms of bleeding.  Hypovolemia hyponatremia, resolved -Improved with IV fluids -Patient tolerated oral intake -Sodium back within normal limits.  Metastatic colon cancer with brain metastasis (status post left cerebellar excision 06/04/2018) extensive metastasis to lumbar, sacrum, T12, pelvis -completed palliative radiation therapy 7/8-7/19/2019. -Oncology recommended tapering MS Contin twice daily dosing -Continue morphine IR 15 mg every 8 hours as needed, regimen managed by oncology -Continue Decadron 4 mg twice daily as managed by oncology -Port-A-Cath placed on 7/26 prior to discharge with no complications.  CAD, stable -Continue atorvastatin -Advised to hold home aspirin until follow-up with [name], for repeat CBC to ensure improvement in platelet count  Type 2 diabetes -A1c 7.9% (05/2018) -Continue home Lantus 12 units regimen.  Hypertension, at goal -Continue home lisinopril   Consultations:  Oncology  Procedures/Studies: Port-A-Cath placed on 7/26  Discharge Exam: BP (!) 153/82 (BP Location: Right Arm)   Pulse 75   Temp 97.6 F (36.4 C) (Oral)   Resp 16   Ht 6' 2.02" (1.88 m)   Wt 83.8  kg (184 lb 11.9 oz)   SpO2 100%   BMI 23.71 kg/m   General: Lying in bed, no apparent distress Eyes: EOMI,  anicteric ENT: Oral Mucosa clear and moist Cardiovascular: regular rate and rhythm, no murmurs, rubs or gallops, no edema, Respiratory: Normal respiratory effort, lungs clear to auscultation bilaterally Abdomen: soft, non-distended, non-tender, normal bowel sounds Skin: No Rash Neurologic: Grossly no focal neuro deficit.Mental status AAOx3, speech normal, Psychiatric:Appropriate affect, and mood   Discharge Instructions You were cared for by a hospitalist during your hospital stay. If you have any questions about your discharge medications or the care you received while you were in the hospital after you are discharged, you can call the unit and asked to speak with the hospitalist on call if the hospitalist that took care of you is not available. Once you are discharged, your primary care physician will handle any further medical issues. Please note that NO REFILLS for any discharge medications will be authorized once you are discharged, as it is imperative that you return to your primary care physician (or establish a relationship with a primary care physician if you do not have one) for your aftercare needs so that they can reassess your need for medications and monitor your lab values.  Discharge Instructions    Diet - low sodium heart healthy   Complete by:  As directed    Increase activity slowly   Complete by:  As directed      Allergies as of 07/18/2018   No Known Allergies     Medication List    STOP taking these medications   zolpidem 5 MG tablet Commonly known as:  AMBIEN     TAKE these medications   acetaminophen 500 MG tablet Commonly known as:  TYLENOL Take 500 mg by mouth every 6 (six) hours as needed (FOR PAIN/HEADACHES.).   atorvastatin 10 MG tablet Commonly known as:  LIPITOR Take 10 mg by mouth daily.   BASAGLAR KWIKPEN 100 UNIT/ML Sopn Inject 0.12 mLs (12 Units total) into the skin at bedtime.   clotrimazole 10 MG troche Commonly known as:  MYCELEX Take  1 tablet (10 mg total) by mouth 4 (four) times daily. Take while on decadron   CoQ10 200 MG Caps Take 200 mg by mouth daily.   dexamethasone 4 MG tablet Commonly known as:  DECADRON Take 1 tablet (4 mg total) by mouth 2 (two) times daily. What changed:  when to take this   dronabinol 2.5 MG capsule Commonly known as:  MARINOL Take 1 capsule (2.5 mg total) by mouth 2 (two) times daily before lunch and supper.   FREESTYLE LIBRE 14 DAY SENSOR Misc 1 application by Does not apply route every 14 (fourteen) days.   gabapentin 300 MG capsule Commonly known as:  NEURONTIN Take 1 capsule (300 mg total) by mouth 3 (three) times daily. 1 to 2 PO QHS   HUMALOG KWIKPEN 100 UNIT/ML KiwkPen Generic drug:  insulin lispro Inject 2-15 Units into the skin See admin instructions. 121-150=2 UNITS, 151-200=3 UNITS, 201-250=5 UNITS, 251-300=8 UNITS, 301-350=11 UNITS, 351-400=15 UNITS   Insulin Pen Needle 31G X 5 MM Misc Use with insulin pen.   lidocaine-prilocaine cream Commonly known as:  EMLA Apply 1 application topically as needed.   lisinopril 2.5 MG tablet Commonly known as:  PRINIVIL,ZESTRIL Take 2.5 mg by mouth daily.   methocarbamol 500 MG tablet Commonly known as:  ROBAXIN Take 500 mg by mouth every 8 (eight) hours as needed. What changed:  Another medication with the same name was removed. Continue taking this medication, and follow the directions you see here.   mirtazapine 15 MG tablet Commonly known as:  REMERON Take 1 tablet (15 mg total) by mouth at bedtime. What changed:    medication strength  how much to take   morphine 30 MG 12 hr tablet Commonly known as:  MS CONTIN Take 1 tablet (30 mg total) by mouth 3 (three) times daily. What changed:  Another medication with the same name was added. Make sure you understand how and when to take each.   morphine 15 MG tablet Commonly known as:  MSIR Take 1 tablet (15 mg total) by mouth every 4 (four) hours as needed for  severe pain. What changed:  Another medication with the same name was added. Make sure you understand how and when to take each.   morphine 15 MG 12 hr tablet Commonly known as:  MS CONTIN TAKE 1 TABLET (15 MG TOTAL) BY MOUTH EVERY 12 HOURS. What changed:  You were already taking a medication with the same name, and this prescription was added. Make sure you understand how and when to take each.   nitroGLYCERIN 0.4 MG SL tablet Commonly known as:  NITROSTAT Place 0.4 mg under the tongue every 5 (five) minutes as needed for chest pain. CALL 911 IF PAIN PERSISTS   omeprazole 20 MG capsule Commonly known as:  PRILOSEC Take 20 mg by mouth daily.   ondansetron 4 MG disintegrating tablet Commonly known as:  ZOFRAN-ODT Take 1 tablet (4 mg total) by mouth every 8 (eight) hours as needed for nausea or vomiting.   tamsulosin 0.4 MG Caps capsule Commonly known as:  FLOMAX Take 0.4 mg by mouth daily after breakfast. 30 minutes after breakfast   trolamine salicylate 10 % cream Commonly known as:  ASPERCREME Apply 1 application topically 2 (two) times daily as needed (FOR BACK PAIN.).      No Known Allergies    The results of significant diagnostics from this hospitalization (including imaging, microbiology, ancillary and laboratory) are listed below for reference.    Significant Diagnostic Studies: Mr Cervical Spine W Wo Contrast  Result Date: 07/04/2018 CLINICAL DATA:  Metastatic colon cancer.  Staging. EXAM: MRI CERVICAL SPINE WITHOUT AND WITH CONTRAST TECHNIQUE: Multiplanar and multiecho pulse sequences of the cervical spine, to include the craniocervical junction and cervicothoracic junction, were obtained without and with intravenous contrast. CONTRAST:  60m MULTIHANCE GADOBENATE DIMEGLUMINE 529 MG/ML IV SOLN COMPARISON:  No previous cervical imaging. FINDINGS: Alignment: Normal Vertebrae: Small foci of metastatic disease are evident in the posterior elements on the right at C5,  posterior elements on the left at C4, within the superior posterior vertebral body at C3 and within the left inferior corner of the C6 vertebral body. No advanced cervical disease. No extraosseous disease in the cervical region. Cord: No cervical cord metastasis. Posterior Fossa, vertebral arteries, paraspinal tissues: Negative Disc levels: No degenerative change in the cervical region. IMPRESSION: Small cervical metastases, in the posterosuperior corner of the C3 vertebral body, the left inferior corner of the C6 vertebral body, the posterior elements on the right at C5 in the posterior elements on the left at C4. No extraosseous tumor. Electronically Signed   By: MNelson ChimesM.D.   On: 07/04/2018 17:03   Mr Thoracic Spine W Wo Contrast  Result Date: 07/04/2018 CLINICAL DATA:  Staging for colon cancer.  Back pain. EXAM: MRI THORACIC WITHOUT AND WITH CONTRAST TECHNIQUE: Multiplanar and  multiecho pulse sequences of the thoracic spine were obtained without and with intravenous contrast. CONTRAST:  70m MULTIHANCE GADOBENATE DIMEGLUMINE 529 MG/ML IV SOLN COMPARISON:  CT 02/25/2018. Lumbar MRI 06/18/2018. Cervical exam same day. FINDINGS: MRI THORACIC SPINE FINDINGS Alignment:  Slightly increased kyphotic curvature. Vertebrae: Extensive regional metastatic disease. 1 cm metastasis within the anterior T1 vertebral body. T2 is negative. T3 shows a small metastasis at the left inferior endplate. T4 shows replacement of the vertebral body by tumor. There is a tumor mass bulging into the posterior mediastinum on the right. No canal compromise. T5 shows tumor throughout the vertebral body, extending into the posterior elements on the left. Tumor bulges into the ventral epidural space but there is no cord compression or foraminal compression. T6 shows near complete marrow replacement in the right side of the vertebral body by tumor. Tumor bulges into the ventral epidural space but does not cause frank cord compression. T7  is negative. T8 is negative. T9 is negative except for involvement of the right transverse process. No encroachment upon the neural structures. T10 shows punctate metastases within the vertebral body. T11 shows a punctate metastasis within the inferior vertebral body. T12 shows a 1 cm metastasis within the upper vertebral body without extraosseous extension. Scattered vertebral body metastases at L1 without extraosseous tumor. Cord: No primary cord metastasis or cord compression. As noted above, there is early epidural tumor involvement at T5 and T6. Paraspinal and other soft tissues: As noted above, there is right-sided posterior mediastinal paravertebral tumor adjacent to the T4 and T5 vertebral bodies. Early right paravertebral involvement at T6. Disc levels: No significant degenerative disc disease. IMPRESSION: Widespread metastatic disease throughout the thoracic region as outlined above. Disease is most pronounced at T4, T5 and T6 where there is extensive vertebral body marrow replacement by tumor. There is early ventral epidural tumor at T5 and T6 but no cord compression at this moment. No neural foraminal involvement. There is paravertebral tumor in the posterior mediastinum on the right at T4 and T5 and to an early extent at T6. Electronically Signed   By: MNelson ChimesM.D.   On: 07/04/2018 16:49   Dg Chest Port 1 View  Result Date: 07/14/2018 CLINICAL DATA:  Weakness, abdominal pain, nausea EXAM: PORTABLE CHEST 1 VIEW COMPARISON:  Chest CT 02/25/2018 FINDINGS: Heart is normal size. Soft tissue fullness in the left hilar/infrahilar region. Cannot exclude adenopathy or enlarging mass. Right lung clear. No effusions. No acute bony abnormality. IMPRESSION: Soft tissue fullness in the left hilar/infrahilar region. Cannot exclude adenopathy or mass. This could be further evaluated with chest CT with IV contrast. Electronically Signed   By: KRolm BaptiseM.D.   On: 07/14/2018 18:56   Ir Imaging Guided Port  Insertion  Result Date: 07/18/2018 INDICATION: Metastatic colon cancer. In need of durable intravenous access for chemotherapy administration. EXAM: IMPLANTED PORT A CATH PLACEMENT WITH ULTRASOUND AND FLUOROSCOPIC GUIDANCE COMPARISON:  CT of the chest, abdomen and pelvis-02/25/2018 MEDICATIONS: Ancef 2 gm IV; The antibiotic was administered within an appropriate time interval prior to skin puncture. ANESTHESIA/SEDATION: Moderate (conscious) sedation was employed during this procedure. A total of Versed 2 mg and Fentanyl 100 mcg was administered intravenously. Moderate Sedation Time: 27 minutes. The patient's level of consciousness and vital signs were monitored continuously by radiology nursing throughout the procedure under my direct supervision. CONTRAST:  None FLUOROSCOPY TIME:  18 seconds (6 mGy) COMPLICATIONS: None immediate. PROCEDURE: The procedure, risks, benefits, and alternatives were explained to the patient. Questions  regarding the procedure were encouraged and answered. The patient understands and consents to the procedure. The right neck and chest were prepped with chlorhexidine in a sterile fashion, and a sterile drape was applied covering the operative field. Maximum barrier sterile technique with sterile gowns and gloves were used for the procedure. A timeout was performed prior to the initiation of the procedure. Local anesthesia was provided with 1% lidocaine with epinephrine. After creating a small venotomy incision, a micropuncture kit was utilized to access the internal jugular vein. Real-time ultrasound guidance was utilized for vascular access including the acquisition of a permanent ultrasound image documenting patency of the accessed vessel. The microwire was utilized to measure appropriate catheter length. A subcutaneous port pocket was then created along the upper chest wall utilizing a combination of sharp and blunt dissection. The pocket was irrigated with sterile saline. A single  lumen thin power injectable port was chosen for placement. The 8 Fr catheter was tunneled from the port pocket site to the venotomy incision. The port was placed in the pocket. The external catheter was trimmed to appropriate length. At the venotomy, an 8 Fr peel-away sheath was placed over a guidewire under fluoroscopic guidance. The catheter was then placed through the sheath and the sheath was removed. Final catheter positioning was confirmed and documented with a fluoroscopic spot radiograph. The port was accessed with a Huber needle, aspirated and flushed with heparinized saline. The venotomy site was closed with an interrupted 4-0 Vicryl suture. The port pocket incision was closed with interrupted 2-0 Vicryl suture and the skin was opposed with a running subcuticular 4-0 Vicryl suture. Dermabond and Steri-strips were applied to both incisions. Dressings were placed. The patient tolerated the procedure well without immediate post procedural complication. FINDINGS: After catheter placement, the tip lies within the superior cavoatrial junction. The catheter aspirates and flushes normally and is ready for immediate use. IMPRESSION: Successful placement of a right internal jugular approach power injectable Port-A-Cath. The catheter is ready for immediate use. Electronically Signed   By: Sandi Mariscal M.D.   On: 07/18/2018 09:24   [name] Ekg Site Rite  Result Date: 07/14/2018 If Site Rite image not attached, placement could not be confirmed due to current cardiac rhythm.   Microbiology: No results found for this or any previous visit (from the past 240 hour(s)).   Labs: Basic Metabolic Panel: Recent Labs  Lab 07/14/18 1033 07/14/18 1737 07/15/18 0551 07/16/18 0635 07/17/18 0858  NA 127*  --  131* 131* 131*  K 4.6  --  4.6 4.2 4.2  CL 97*  --  101 102 99  CO2 21*  --  23 20* 25  GLUCOSE 185*  --  274* 195* 174*  BUN 16  --  13 15 17   CREATININE 0.58* 0.57* 0.43* 0.45* 0.46*  CALCIUM 8.3*  --  7.5*  7.5* 7.7*  MG 2.1  --   --   --   --    Liver Function Tests: Recent Labs  Lab 07/14/18 1033 07/15/18 0551  AST 15 13*  ALT 30 23  ALKPHOS 247* 160*  BILITOT 0.4 0.3  PROT 5.4* 4.5*  ALBUMIN 2.0* 1.8*   No results for input(s): LIPASE, AMYLASE in the last 168 hours. No results for input(s): AMMONIA in the last 168 hours. CBC: Recent Labs  Lab 07/14/18 1033 07/14/18 1737 07/15/18 0551 07/16/18 0635 07/17/18 0858 07/18/18 0535  WBC 3.0* 2.8* 2.9* 2.7* 3.4* 4.4  NEUTROABS 2.0  --  2.5  --   --   --  HGB 14.8 14.5 12.9* 12.1* 13.8 12.5*  HCT 43.2 42.7 38.5* 35.8* 40.9 37.0*  MCV 86.4 85.9 87.1 86.9 88.0 86.4  PLT 108* 159 143* 130* 140* 117*   Cardiac Enzymes: No results for input(s): CKTOTAL, CKMB, CKMBINDEX, TROPONINI in the last 168 hours. BNP: BNP (last 3 results) No results for input(s): BNP in the last 8760 hours.  ProBNP (last 3 results) No results for input(s): PROBNP in the last 8760 hours.  CBG: Recent Labs  Lab 07/17/18 1648 07/17/18 2113 07/17/18 2228 07/18/18 0741 07/18/18 1135  GLUCAP 255* 247* 229* 212* 205*       Signed:  Desiree Hane, MD [facility] 07/18/2018, 12:59 PM

## 2018-07-18 NOTE — Procedures (Signed)
Pre Procedure Dx: Colon Cancer Post Procedural Dx: Same  Successful placement of right IJ approach port-a-cath with tip at the superior caval atrial junction. The catheter is ready for immediate use.  Estimated Blood Loss: Minimal  Complications: None immediate.  Jay Jerilynn Feldmeier, MD Pager #: [phone]   

## 2018-07-21 ENCOUNTER — Other Ambulatory Visit (HOSPITAL_COMMUNITY): Payer: 59

## 2018-07-21 ENCOUNTER — Other Ambulatory Visit: Payer: Self-pay | Admitting: *Deleted

## 2018-07-21 ENCOUNTER — Ambulatory Visit (HOSPITAL_COMMUNITY): Payer: 59

## 2018-07-21 NOTE — Patient Outreach (Addendum)
[place] [facility]) Care Management  07/21/2018  Allen Glenn 04-10-1956 [id number]   Spoke with both [name] and his wife [name], she is a Marine scientist and certified diabetes educator with [employer], to complete transition of care phone call. See transition of care template for details.  [name] was hospitalized at [facility] from 7/22-7/26 with pancytopenia, decreased oral intake, dehydration, generalized weakness, cancer related pain and anemia. [name] has colon cancer with metastasis to liver, bone, and brain. He also has Type II DM , HTN, hyperlipidemia and CAD s/p MI with angioplasty to LAD in 2016.  [name] states [name] had a much better weekend, is able to take in more fluid and nutrition but is still quite weak. He is currently walking with a cane but also has a walker. He also has a bedside commode that he uses at night. His bedroom is on the ground floor of the home.  [name] states [name] has round the clock supervision by family members.  She says [name] had a portacath inserted on 7/26 and he is scheduled to start chemotherapy on 07/29/18. She says the radiation to his spine has helped his back pain significantly so he has been able to decrease the amount of morphine he is taking. Reviewed strategies to prevent constipation.  [name] asked about securing a hospital bed and home health services should [name] need them in the future. Advised her it would require a physician's order and suggested she speak to [name] and his office staff could facilitate securing the home health services and hospital bed.  Provided [name] with this RNCM's direct office number and encouraged her to call if she or Kable have additional questions or concerns.  Will monitor patient's status and assist as needed. Shon remains active with [facility] so will send update via secure e-mail.  Barrington Ellison RN,CCM,CDE [facility] Management Coordinator Office Phone [phone] Office Fax  [phone]

## 2018-07-21 NOTE — Progress Notes (Signed)
  Radiation Oncology         [phone] ________________________________  Name: SYED ZUKAS MRN: [id number]  Date: 07/11/2018  DOB: 07/10/1956  End of Treatment Note  Diagnosis:   62 y.o. 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  His lower back pain and gait are much improved. Other than some increased fatigue, he had no significant difficulties with treatment.  Plan: The patient has completed radiation treatment. I advised the patient to start tapering his pain medication so that he can drive again. The patient will return to radiation oncology clinic for routine followup in one month. I advised them to call or return sooner if they have any questions or concerns related to their recovery or treatment.  ------------------------------------------------  Jodelle Gross, MD, PhD  This document serves as a record of services personally performed by Kyung Rudd, MD. It was created on his behalf by Rae Lips, a trained medical scribe. The creation of this record is based on the scribe's personal observations and the provider's statements to them. This document has been checked and approved by the attending provider.

## 2018-07-22 ENCOUNTER — Other Ambulatory Visit: Payer: 59

## 2018-07-22 ENCOUNTER — Ambulatory Visit: Payer: 59

## 2018-07-22 ENCOUNTER — Ambulatory Visit: Payer: 59 | Admitting: Oncology

## 2018-07-24 ENCOUNTER — Encounter: Payer: Self-pay | Admitting: Oncology

## 2018-07-24 ENCOUNTER — Telehealth: Payer: Self-pay

## 2018-07-24 DIAGNOSIS — C189 Malignant neoplasm of colon, unspecified: Secondary | ICD-10-CM

## 2018-07-24 DIAGNOSIS — R112 Nausea with vomiting, unspecified: Secondary | ICD-10-CM

## 2018-07-24 DIAGNOSIS — R531 Weakness: Secondary | ICD-10-CM

## 2018-07-24 MED ORDER — PROMETHAZINE HCL 12.5 MG PO TABS: 12.5000 mg | ORAL_TABLET | Freq: Four times a day (QID) | ORAL | 0 refills | Status: DC | PRN

## 2018-07-24 NOTE — Telephone Encounter (Signed)
Spoke with pt wife regarding MyChart note. Per [name] pt to push fluids, take imodium for diarrhea, phenergan added for nausea. PT consult sent through [facility] for pt. [name] to discuss treatment questions with pt next week. Pt and wife voiced understanding. Pt denied appt to see [facility].

## 2018-07-25 ENCOUNTER — Telehealth: Payer: Self-pay

## 2018-07-25 ENCOUNTER — Encounter: Payer: Self-pay | Admitting: Oncology

## 2018-07-25 NOTE — Telephone Encounter (Signed)
Returned call to pt wife. Pt wife questioned utilizing home health for assessment and possible fluids. Per Ned Card and Sandi Mealy, pt needs to be evaluated by a physician/practioner. Pt to report to the ED for evaluation. Pt wife informed about notifying ED staff of the pt being an oncology pt on active treatment. Pt wife voiced understanding.

## 2018-07-26 ENCOUNTER — Other Ambulatory Visit: Payer: Self-pay | Admitting: Oncology

## 2018-07-26 DIAGNOSIS — C7951 Secondary malignant neoplasm of bone: Principal | ICD-10-CM

## 2018-07-26 DIAGNOSIS — C189 Malignant neoplasm of colon, unspecified: Secondary | ICD-10-CM

## 2018-07-28 ENCOUNTER — Encounter: Payer: Self-pay | Admitting: Oncology

## 2018-07-28 ENCOUNTER — Other Ambulatory Visit: Payer: Self-pay | Admitting: Emergency Medicine

## 2018-07-28 ENCOUNTER — Inpatient Hospital Stay: Payer: 59 | Attending: Oncology

## 2018-07-28 ENCOUNTER — Telehealth: Payer: Self-pay | Admitting: Medical

## 2018-07-28 ENCOUNTER — Inpatient Hospital Stay (HOSPITAL_BASED_OUTPATIENT_CLINIC_OR_DEPARTMENT_OTHER): Payer: 59 | Admitting: Medical

## 2018-07-28 ENCOUNTER — Telehealth: Payer: Self-pay

## 2018-07-28 ENCOUNTER — Encounter: Payer: Self-pay | Admitting: Medical

## 2018-07-28 VITALS — BP 132/88 | HR 84 | Temp 98.4°F | Resp 17 | Ht 74.02 in

## 2018-07-28 DIAGNOSIS — Z5111 Encounter for antineoplastic chemotherapy: Secondary | ICD-10-CM | POA: Insufficient documentation

## 2018-07-28 DIAGNOSIS — R200 Anesthesia of skin: Secondary | ICD-10-CM | POA: Diagnosis not present

## 2018-07-28 DIAGNOSIS — Z794 Long term (current) use of insulin: Secondary | ICD-10-CM | POA: Insufficient documentation

## 2018-07-28 DIAGNOSIS — C7951 Secondary malignant neoplasm of bone: Secondary | ICD-10-CM | POA: Insufficient documentation

## 2018-07-28 DIAGNOSIS — C787 Secondary malignant neoplasm of liver and intrahepatic bile duct: Secondary | ICD-10-CM | POA: Diagnosis not present

## 2018-07-28 DIAGNOSIS — C182 Malignant neoplasm of ascending colon: Secondary | ICD-10-CM

## 2018-07-28 DIAGNOSIS — B370 Candidal stomatitis: Secondary | ICD-10-CM | POA: Insufficient documentation

## 2018-07-28 DIAGNOSIS — E119 Type 2 diabetes mellitus without complications: Secondary | ICD-10-CM | POA: Diagnosis not present

## 2018-07-28 DIAGNOSIS — R627 Adult failure to thrive: Secondary | ICD-10-CM | POA: Diagnosis not present

## 2018-07-28 DIAGNOSIS — R197 Diarrhea, unspecified: Secondary | ICD-10-CM | POA: Diagnosis not present

## 2018-07-28 DIAGNOSIS — C7931 Secondary malignant neoplasm of brain: Secondary | ICD-10-CM | POA: Insufficient documentation

## 2018-07-28 DIAGNOSIS — E860 Dehydration: Secondary | ICD-10-CM

## 2018-07-28 DIAGNOSIS — R110 Nausea: Secondary | ICD-10-CM

## 2018-07-28 DIAGNOSIS — C189 Malignant neoplasm of colon, unspecified: Secondary | ICD-10-CM

## 2018-07-28 LAB — CMP (CANCER CENTER ONLY)
ALT: 46 U/L — AB (ref 0–44)
ANION GAP: 11 (ref 5–15)
AST: 28 U/L (ref 15–41)
Albumin: 2.7 g/dL — ABNORMAL LOW (ref 3.5–5.0)
Alkaline Phosphatase: 523 U/L — ABNORMAL HIGH (ref 38–126)
BUN: 20 mg/dL (ref 8–23)
CALCIUM: 8.4 mg/dL — AB (ref 8.9–10.3)
CHLORIDE: 99 mmol/L (ref 98–111)
CO2: 23 mmol/L (ref 22–32)
CREATININE: 0.62 mg/dL (ref 0.61–1.24)
GFR, Est AFR Am: >60 mL/min (ref >60)
Glucose, Bld: 167 mg/dL — ABNORMAL HIGH (ref 70–99)
Potassium: 4.4 mmol/L (ref 3.5–5.1)
Sodium: 133 mmol/L — ABNORMAL LOW (ref 135–145)
Total Bilirubin: 0.4 mg/dL (ref 0.3–1.2)
Total Protein: 6 g/dL — ABNORMAL LOW (ref 6.5–8.1)

## 2018-07-28 LAB — CBC WITH DIFFERENTIAL (CANCER CENTER ONLY)
BASOS ABS: 0 10*3/uL (ref 0.0–0.1)
Basophils Relative: 0 %
EOS ABS: 0 10*3/uL (ref 0.0–0.5)
EOS PCT: 0 %
HCT: 40.5 % (ref 38.4–49.9)
Hemoglobin: 14 g/dL (ref 13.0–17.1)
LYMPHS PCT: 8 %
Lymphs Abs: 0.9 10*3/uL (ref 0.9–3.3)
MCH: 30 pg (ref 27.2–33.4)
MCHC: 34.6 g/dL (ref 32.0–36.0)
MCV: 86.7 fL (ref 79.3–98.0)
Monocytes Absolute: 0.3 10*3/uL (ref 0.1–0.9)
Monocytes Relative: 3 %
NEUTROS PCT: 89 %
Neutro Abs: 10.4 10*3/uL — ABNORMAL HIGH (ref 1.5–6.5)
PLATELETS: 137 10*3/uL — AB (ref 140–400)
RBC: 4.67 MIL/uL (ref 4.20–5.82)
RDW: 15.4 % — ABNORMAL HIGH (ref 11.0–14.6)
WBC: 11.7 10*3/uL — AB (ref 4.0–10.3)

## 2018-07-28 MED ORDER — PROCHLORPERAZINE MALEATE 5 MG PO TABS: 5.0000 mg | ORAL_TABLET | Freq: Four times a day (QID) | ORAL | 1 refills | Status: DC | PRN

## 2018-07-28 MED ORDER — HEPARIN SOD (PORK) LOCK FLUSH 100 UNIT/ML IV SOLN
500.0000 [IU] | Freq: Once | INTRAVENOUS | Status: AC
  Administered 2018-07-28: 500 [IU] via INTRAVENOUS
  Filled 2018-07-28: qty 5

## 2018-07-28 MED ORDER — LORAZEPAM 0.5 MG PO TABS: 0.5000 mg | ORAL_TABLET | Freq: Three times a day (TID) | ORAL | 0 refills | Status: DC | PRN

## 2018-07-28 MED ORDER — SODIUM CHLORIDE 0.9% FLUSH
10.0000 mL | Freq: Once | INTRAVENOUS | Status: AC
  Administered 2018-07-28: 10 mL
  Filled 2018-07-28: qty 10

## 2018-07-28 MED ORDER — LORAZEPAM 2 MG/ML IJ SOLN
INTRAMUSCULAR | Status: AC
  Filled 2018-07-28: qty 1

## 2018-07-28 MED ORDER — FLUCONAZOLE 100 MG PO TABS: 100.0000 mg | ORAL_TABLET | Freq: Every day | ORAL | 0 refills | Status: DC

## 2018-07-28 MED ORDER — MAGIC MOUTHWASH: 5.0000 mL | Freq: Four times a day (QID) | ORAL | 3 refills | Status: AC | PRN

## 2018-07-28 MED ORDER — SODIUM CHLORIDE 0.9 % IV SOLN
Freq: Once | INTRAVENOUS | Status: AC
  Administered 2018-07-28: 14:00:00 via INTRAVENOUS
  Filled 2018-07-28: qty 250

## 2018-07-28 MED ORDER — METOCLOPRAMIDE HCL 10 MG PO TABS: 10.0000 mg | ORAL_TABLET | Freq: Three times a day (TID) | ORAL | 3 refills | Status: DC

## 2018-07-28 MED ORDER — LORAZEPAM 2 MG/ML IJ SOLN
0.5000 mg | Freq: Once | INTRAMUSCULAR | Status: AC
  Administered 2018-07-28: 0.5 mg via INTRAVENOUS

## 2018-07-28 MED FILL — MAGIC MW (NYS,BEN,MAAL): 12 days supply | Qty: 240 | Fill #0

## 2018-07-28 MED FILL — METOCLOPRAMIDE 10 MG TABLET: 10 | 10 days supply | Qty: 30 | Fill #0 | Status: TO

## 2018-07-28 MED FILL — LORazepam 0.5 MG TABS: 0.5 | 10 days supply | Qty: 30 | Fill #0

## 2018-07-28 MED FILL — FLUCONAZOLE 100 MG TABLET: 100 | 10 days supply | Qty: 10 | Fill #0

## 2018-07-28 NOTE — Telephone Encounter (Signed)
Scheduled appt per 8/5 los - pt wife is aware of appt date and time.

## 2018-07-28 NOTE — Patient Instructions (Signed)
Dehydration, Adult Dehydration is when there is not enough fluid or water in your body. This happens when you lose more fluids than you take in. Dehydration can range from mild to very bad. It should be treated right away to keep it from getting very bad. Symptoms of mild dehydration may include:  Thirst.  Dry lips.  Slightly dry mouth.  Dry, warm skin.  Dizziness. Symptoms of moderate dehydration may include:  Very dry mouth.  Muscle cramps.  Dark pee (urine). Pee may be the color of tea.  Your body making less pee.  Your eyes making fewer tears.  Heartbeat that is uneven or faster than normal (palpitations).  Headache.  Light-headedness, especially when you stand up from sitting.  Fainting (syncope). Symptoms of very bad dehydration may include:  Changes in skin, such as: ? Cold and clammy skin. ? Blotchy (mottled) or pale skin. ? Skin that does not quickly return to normal after being lightly pinched and let go (poor skin turgor).  Changes in body fluids, such as: ? Feeling very thirsty. ? Your eyes making fewer tears. ? Not sweating when body temperature is high, such as in hot weather. ? Your body making very little pee.  Changes in vital signs, such as: ? Weak pulse. ? Pulse that is more than 100 beats a minute when you are sitting still. ? Fast breathing. ? Low blood pressure.  Other changes, such as: ? Sunken eyes. ? Cold hands and feet. ? Confusion. ? Lack of energy (lethargy). ? Trouble waking up from sleep. ? Short-term weight loss. ? Unconsciousness. Follow these instructions at home:  If told by your doctor, drink an ORS: ? Make an ORS by using instructions on the package. ? Start by drinking small amounts, about  cup (120 mL) every 5-10 minutes. ? Slowly drink more until you have had the amount that your doctor said to have.  Drink enough clear fluid to keep your pee clear or pale yellow. If you were told to drink an ORS, finish the ORS  first, then start slowly drinking clear fluids. Drink fluids such as: ? Water. Do not drink only water by itself. Doing that can make the salt (sodium) level in your body get too low (hyponatremia). ? Ice chips. ? Fruit juice that you have added water to (diluted). ? Low-calorie sports drinks.  Avoid: ? Alcohol. ? Drinks that have a lot of sugar. These include high-calorie sports drinks, fruit juice that does not have water added, and soda. ? Caffeine. ? Foods that are greasy or have a lot of fat or sugar.  Take over-the-counter and prescription medicines only as told by your doctor.  Do not take salt tablets. Doing that can make the salt level in your body get too high (hypernatremia).  Eat foods that have minerals (electrolytes). Examples include bananas, oranges, potatoes, tomatoes, and spinach.  Keep all follow-up visits as told by your doctor. This is important. Contact a doctor if:  You have belly (abdominal) pain that: ? Gets worse. ? Stays in one area (localizes).  You have a rash.  You have a stiff neck.  You get angry or annoyed more easily than normal (irritability).  You are more sleepy than normal.  You have a harder time waking up than normal.  You feel: ? Weak. ? Dizzy. ? Very thirsty.  You have peed (urinated) only a small amount of very dark pee during 6-8 hours. Get help right away if:  You have symptoms of   very bad dehydration.  You cannot drink fluids without throwing up (vomiting).  Your symptoms get worse with treatment.  You have a fever.  You have a very bad headache.  You are throwing up or having watery poop (diarrhea) and it: ? Gets worse. ? Does not go away.  You have blood or something green (bile) in your throw-up.  You have blood in your poop (stool). This may cause poop to look black and tarry.  You have not peed in 6-8 hours.  You pass out (faint).  Your heart rate when you are sitting still is more than 100 beats a  minute.  You have trouble breathing. This information is not intended to replace advice given to you by your health care provider. Make sure you discuss any questions you have with your health care provider. Document Released: 10/06/2009 Document Revised: 06/29/2016 Document Reviewed: 02/03/2016 [employer] Interactive Patient Education  2018 [employer]  

## 2018-07-28 NOTE — Progress Notes (Signed)
Reports improvement in nausea/fatigue after ativan & fluids.  Eating soup and crackers, tolerated well.

## 2018-07-28 NOTE — Telephone Encounter (Signed)
Spoke with patient wife concerning [facility]. She said Melia was going to let her know more about it today. Spoke with Threasa Beards and she going to get back with me later on weather or not to schedule the patient and with what. Per 8/5 phone que.

## 2018-07-28 NOTE — Telephone Encounter (Signed)
Spoke with pt wife. Pt wife requesting appt for [facility] eval. Wife states "he is still very nauseous, but he did better with eating yday. He had a weak spell and fell onto the love seat, not sure if he hit his head". This RN will consult Sandi Mealy, PA and notify pt.   Ok to add to [facility]. Notified pt wife. Pt wife voiced understanding/

## 2018-07-28 NOTE — Progress Notes (Signed)
Symptoms Management Clinic Progress Note   Allen Glenn [id number] June 10, 1956 62 y.o.  Allen Glenn is managed by 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  Nausea: Patient was given Ativan 0.5 mg IV today.  He is also given a prescription for Ativan 0.5 mg that he can use at home as needed for nausea.  The patient additionally was given a prescription for Reglan 10 mg p.o. q. before meals.  Oral candidiasis: The patient was given a prescription for Diflucan once daily and Magic mouthwash.  Metastatic colon cancer with metastatic disease to the bone, liver, and brain.  Dr. Dominica Severin B. Sherrill discuss the possibility of referring the patient for a brain MRI with radiation oncology today.     Please see After Visit Summary for patient specific instructions.  Future Appointments  Date Time Provider [place]  08/01/2018 11:15 AM Owens Shark, NP [facility] None  09/10/2018  9:30 AM Hayden Pedro, PA-C [facility] None  09/23/2018  8:40 AM [name], MD [facility]    No orders of the defined types were placed in this encounter.      Subjective:   Patient ID:  Allen Glenn is a 62 y.o. (DOB 1956-02-26) male.  Chief Complaint:  Chief Complaint  Patient presents with  . Fatigue    HPI Allen Glenn is a 62 year old male with a history of a metastatic colon cancer with brain metastasis status post radiation therapy.  He is status post resection of a left cerebellar metastasis.  He has been found to have thoracic  spine metastasis and is undergoing radiation therapy.  He was last seen in Sympt6om Management on 07/14/2018 for nausea, vomiting, diarrhea and anorexia.  He was admitted to [facility] through the ER on 07/14/2018 after evaluated by Symptom Management prior to admission for dehydration however. We were unable to place an IV line. He was admitted for supportive care of nausea, vomiting, diarrhea in setting of dehydration and diminished p.o. intake.  He was discharged on 07/18/2018. The patient's wife sent the following message to [name] on 08/01/2019[name] and I would like to speak to [name] about the chemo for a few minutes. We understood the side effect is severe diarrhea and concerned because he remains weak without much appetite and had diarrhea 2 times yesterday. He was able to drink so urine is light yellow this morning but is having nausea which I am giving him Zofran. Rustin down to 165 lbs. Addie does want to do chemo but we would like to speak to [name].  -medicine that can help control the diarrhea expected or can he be placed on IVs or hospitalized if needed to during this time to help?  -[place] wondering if we should try to other kind of chemo that caused him some peripheral neuropathy instead due to being able to tolerate?  -Home Health consult to assist with fluids as needed  -PT consult for home (he's getting up walking several times per day to gain strength.  Dr. Ashok Cordia nurse contacted the patient with Dr. [name] recommendations to "push fluids, take imodium for diarrhea, phenergan added for nausea." A PT consult was sent. [name] planned to discuss treatment questions with Allen Glenn and his wife at his appointment on 07/29/2018. He was offered and declined an appointment with Symptom Management.  The patient's wife contacted [name] on 07/25/2018 reporting that the patient was taking Zofran and phenergan for nausea but continued to have nausea  regularly. They requested scopolamine patches but were told that a scopolamine patch was not something that [name] usually likes to prescribe for nausea. They were told that if Allen Glenn was unable to eat or drink or is having episodes of vomiting, that it was recommended that he be seen by the Symptom Management Clinic or the ED since it was the weekend and he has complications. The recommendation were to continue his current anti-nausea regimen and ensure that he is staying adequately hydrated.   A call was returned call to patients's wife. She inquired if home health could see the patient for an assessment and possible fluids. The patient's wife was told that the patient needed to be evaluated by a physician/practioner.  It was recommended that the patient be taken to the ER for evaluation. The patient's wife was told to notify the ED staff of the patient being an oncology patient on active treatment so that his evaluation would be expedited. She voiced understanding.  They did not present to the ER and contacted the office today, stating that "he is still very nauseous, but he did better with eating yday. He had a weak spell and fell onto the love seat, not sure if he hit his head".   Allen Glenn reports that he has weaned off of morphine sulfate extended release over the last 7 days.  He reports that he is walking several times at home each day but continues to be extremely weak.  He reports having a foul taste in his mouth with sores.  He states that he is eating better and is increasing his intake of fluids.  He is drinking a protein shake in the morning and in the evening.  He is taking Phenergan for nausea.  Despite this he has constant nausea.  He is currently on Decadron 4 mg p.o. twice daily.  He reports that he stays cold but denies fevers or sweats.  Medications: I have reviewed the patient's current medications.  Allergies: No Known Allergies  Past Medical History:  Diagnosis Date  .  Acute MI ([place]) 2014   acute ST elevation MI  . Anemia   . Colon cancer ([place]) 2016   Status post resection of colon mass is well as he panic metastasis.   . Coronary artery disease   . Diabetes mellitus without complication ([place])   . Enlarged prostate    slightly and takes Flomax daily  . Heart disease   . History of blood transfusion   . Hyperlipidemia    takes Lipitor daily  . Hypertension    takes Lisinopril daily  . Lung nodule    left  . Nocturia   . Numbness    left foot  . Pancreatitis   . Peripheral neuropathy     Past Surgical History:  Procedure Laterality Date  . 1/8 of liver removed    . APPENDECTOMY    . APPLICATION OF CRANIAL NAVIGATION Left 06/04/2018   Procedure: APPLICATION OF CRANIAL NAVIGATION;  Surgeon: Erline Levine, MD;  Location:  [facility] OR;  Service: Neurosurgery;  Laterality: Left;  . BIOPSY  05/22/2018   Procedure: BIOPSY;  Surgeon: Carol Ada, MD;  Location: [facility];  Service: Endoscopy;;  . BRAIN SURGERY  06/04/2018  . COLONOSCOPY    . CORONARY ANGIOPLASTY     1 stent  . CORONARY STENT PLACEMENT  06-27-2013  . CRANIOTOMY Left 06/04/2018   Procedure: CRANIOTOMY TUMOR EXCISION with Brainlab;  Surgeon: Erline Levine, MD;  Location: [facility];  Service: Neurosurgery;  Laterality: Left;  CRANIOTOMY TUMOR EXCISION with Brainlab  . ESOPHAGOGASTRODUODENOSCOPY Left 05/22/2018   Procedure: ESOPHAGOGASTRODUODENOSCOPY (EGD);  Surgeon: Carol Ada, MD;  Location: [facility];  Service: Endoscopy;  Laterality: Left;  . HERNIA REPAIR Left 1991  . IR IMAGING GUIDED PORT INSERTION  07/18/2018  . LAPAROSCOPIC RIGHT COLECTOMY  2016   [place]  . LIVER LOBECTOMY Right 09/28/2015    Right partial hep[facility] at [facility]  . LUNG BIOPSY Left 11/28/2016   Procedure: LUNG BIOPSY, left upper lobe;  Surgeon: [name], MD;  Location: [place];  Service: Thoracic;  Laterality: Left;  Marland Kitchen VIDEO BRONCHOSCOPY WITH ENDOBRONCHIAL NAVIGATION N/A 11/28/2016   Procedure: VIDEO  BRONCHOSCOPY WITH ENDOBRONCHIAL NAVIGATION;  Surgeon: [name], MD;  Location: [facility] OR;  Service: Thoracic;  Laterality: N/A;  . VIDEO BRONCHOSCOPY WITH ENDOBRONCHIAL ULTRASOUND N/A 11/28/2016   Procedure: VIDEO BRONCHOSCOPY WITH ENDOBRONCHIAL ULTRASOUND;  Surgeon: [name], MD;  Location: [facility] OR;  Service: Thoracic;  Laterality: N/A;    Family History  Problem Relation Age of Onset  . Hypertension Mother   . Heart disease Mother   . Hypertension Father   . Heart disease Father   . Neuropathy Neg Hx     Social History   Socioeconomic History  . Marital status: Married    Spouse name: [name]  . Number of children: 2  . Years of education: Bachelor  . Highest education level: Not on file  Occupational History  . Occupation: Company secretary  Social Needs  . Financial resource strain: Not on file  . Food insecurity:    Worry: Not on file    Inability: Not on file  . Transportation needs:    Medical: Not on file    Non-medical: Not on file  Tobacco Use  . Smoking status: Never Smoker  . Smokeless tobacco: Never Used  Substance and Sexual Activity  . Alcohol use: No    Alcohol/week: 0.0 oz  . Drug use: No  . Sexual activity: Not on file  Lifestyle  . Physical activity:    Days per week: Not on file    Minutes per session: Not on file  . Stress: Not on file  Relationships  . Social connections:    Talks on phone: Not on file    Gets together: Not on file    Attends religious service: Not on file    Active member of club or organization: Not on file    Attends meetings of clubs or organizations: Not on file    Relationship status: Not on file  . Intimate partner violence:    Fear of current or ex partner: Not on file    Emotionally abused: Not on file    Physically abused: Not on file    Forced sexual activity: Not on file  Other Topics Concern  . Not on file  Social History Narrative   Live at home with wife, [name]   Patient is a [employer] manager, active and  goes to gym   Caffeine:  very little soda, occasional tea. 12oz-16oz/per    Past Medical History, Surgical history, Social history, and Family history were reviewed and updated as appropriate.   Please see review of systems for further details on the patient's review from today.   Review of Systems:  Review of Systems  Constitutional: Positive for chills and fatigue. Negative for appetite change, diaphoresis and fever.  HENT: Positive for mouth sores, sore throat and trouble swallowing. Negative for dental problem.   Respiratory: Negative for cough, chest tightness and shortness of breath.   Cardiovascular: Negative for chest pain and palpitations.  Gastrointestinal: Positive for nausea. Negative for constipation, diarrhea and vomiting.  Neurological: Positive for weakness and numbness (Lower extremity neuropathy). Negative for dizziness, syncope and headaches.    Objective:   Physical Exam:  BP 132/88 (BP Location: Left Arm, Patient Position: Sitting)   Pulse 84   Temp 98.4 F (36.9 C) (Oral)   Resp 17   Ht 6' 2.02" (1.88 m)   SpO2 99%   BMI 23.71 kg/m  ECOG: 2  Physical Exam  Constitutional:  Allen Glenn is an adult, chronically ill-appearing male who appears to be weak but in no acute distress.  HENT:  Patient's face appears cushingoid. Whitish plaquing is noted over the tongue and posterior pharynx.  Eyes: Right eye exhibits no discharge. Left eye exhibits no discharge. No scleral icterus.  Cardiovascular: Normal rate, regular rhythm and normal heart sounds. Exam reveals no gallop and no friction rub.  No murmur heard. Pulmonary/Chest: Effort normal and breath sounds normal. No stridor. No respiratory distress. He has no wheezes. He has no rales.  Skin: Skin is warm and dry.  Psychiatric: Thought content normal.    Lab Review:     Component Value Date/Time   NA 133 (L) 07/28/2018 1140   NA 137 11/19/2017 0822   K 4.4 07/28/2018 1140   K 4.2 11/19/2017 0822   CL  99 07/28/2018 1140   CO2 23 07/28/2018 1140   CO2 24 11/19/2017 0822   GLUCOSE 167 (H) 07/28/2018 1140   GLUCOSE 189 (H) 11/19/2017 0822   BUN 20 07/28/2018 1140   BUN 13.0 11/19/2017 0822   CREATININE 0.62 07/28/2018 1140   CREATININE 0.8 11/19/2017 0822   CALCIUM 8.4 (L) 07/28/2018 1140   CALCIUM 8.9 11/19/2017 0822   PROT 6.0 (L) 07/28/2018 1140   PROT 7.1 11/19/2017 0822   ALBUMIN 2.7 (L) 07/28/2018 1140   ALBUMIN 4.1 11/19/2017 0822   AST 28 07/28/2018 1140   AST 34 11/19/2017 0822   ALT 46 (H) 07/28/2018 1140   ALT 58 (H) 11/19/2017 0822   ALKPHOS 523 (H) 07/28/2018 1140   ALKPHOS 94 11/19/2017 0822   BILITOT 0.4 07/28/2018 1140   BILITOT 0.42 11/19/2017 0822   GFRNONAA >60 07/28/2018 1140   GFRAA >60 07/28/2018 1140       Component Value Date/Time   WBC 11.7 (H) 07/28/2018 1140   WBC 4.4 07/18/2018 0535   RBC 4.67 07/28/2018 1140   HGB 14.0 07/28/2018 1140   HCT 40.5 07/28/2018 1140   PLT 137 (L) 07/28/2018 1140   MCV 86.7 07/28/2018 1140   MCH 30.0 07/28/2018 1140   MCHC 34.6 07/28/2018 1140   RDW 15.4 (H) 07/28/2018 1140   LYMPHSABS 0.9 07/28/2018 1140   MONOABS 0.3 07/28/2018 1140   EOSABS 0.0 07/28/2018 1140   BASOSABS 0.0 07/28/2018 1140   -------------------------------  Imaging from last 24 hours (if applicable):  Radiology interpretation: Allen Cervical Spine W  Wo Glenn  Result Date: 07/04/2018 CLINICAL DATA:  Metastatic colon cancer.  Staging. EXAM: MRI CERVICAL SPINE WITHOUT AND WITH Glenn TECHNIQUE: Multiplanar and multiecho pulse sequences of the cervical spine, to include the craniocervical junction and cervicothoracic junction, were obtained without and with intravenous Glenn. Glenn:  59m MULTIHANCE GADOBENATE DIMEGLUMINE 529 MG/ML IV SOLN COMPARISON:  No previous cervical imaging. FINDINGS: Alignment: Normal Vertebrae: Small foci of metastatic disease are evident in the posterior elements on the right at C5, posterior elements on the  left at C4, within the superior posterior vertebral body at C3 and within the left inferior corner of the C6 vertebral body. No advanced cervical disease. No extraosseous disease in the cervical region. Cord: No cervical cord metastasis. Posterior Fossa, vertebral arteries, paraspinal tissues: Negative Disc levels: No degenerative change in the cervical region. IMPRESSION: Small cervical metastases, in the posterosuperior corner of the C3 vertebral body, the left inferior corner of the C6 vertebral body, the posterior elements on the right at C5 in the posterior elements on the left at C4. No extraosseous tumor. Electronically Signed   By: MNelson ChimesM.D.   On: 07/04/2018 17:03   Allen Thoracic Spine W Wo Glenn  Result Date: 07/04/2018 CLINICAL DATA:  Staging for colon cancer.  Back pain. EXAM: MRI THORACIC WITHOUT AND WITH Glenn TECHNIQUE: Multiplanar and multiecho pulse sequences of the thoracic spine were obtained without and with intravenous Glenn. Glenn:  122mMULTIHANCE GADOBENATE DIMEGLUMINE 529 MG/ML IV SOLN COMPARISON:  CT 02/25/2018. Lumbar MRI 06/18/2018. Cervical exam same day. FINDINGS: MRI THORACIC SPINE FINDINGS Alignment:  Slightly increased kyphotic curvature. Vertebrae: Extensive regional metastatic disease. 1 cm metastasis within the anterior T1 vertebral body. T2 is negative. T3 shows a small metastasis at the left inferior endplate. T4 shows replacement of the vertebral body by tumor. There is a tumor mass bulging into the posterior mediastinum on the right. No canal compromise. T5 shows tumor throughout the vertebral body, extending into the posterior elements on the left. Tumor bulges into the ventral epidural space but there is no cord compression or foraminal compression. T6 shows near complete marrow replacement in the right side of the vertebral body by tumor. Tumor bulges into the ventral epidural space but does not cause frank cord compression. T7 is negative. T8 is  negative. T9 is negative except for involvement of the right transverse process. No encroachment upon the neural structures. T10 shows punctate metastases within the vertebral body. T11 shows a punctate metastasis within the inferior vertebral body. T12 shows a 1 cm metastasis within the upper vertebral body without extraosseous extension. Scattered vertebral body metastases at L1 without extraosseous tumor. Cord: No primary cord metastasis or cord compression. As noted above, there is early epidural tumor involvement at T5 and T6. Paraspinal and other soft tissues: As noted above, there is right-sided posterior mediastinal paravertebral tumor adjacent to the T4 and T5 vertebral bodies. Early right paravertebral involvement at T6. Disc levels: No significant degenerative disc disease. IMPRESSION: Widespread metastatic disease throughout the thoracic region as outlined above. Disease is most pronounced at T4, T5 and T6 where there is extensive vertebral body marrow replacement by tumor. There is early ventral epidural tumor at T5 and T6 but no cord compression at this moment. No neural foraminal involvement. There is paravertebral tumor in the posterior mediastinum on the right at T4 and T5 and to an early extent at T6. Electronically Signed   By: MaNelson Chimes.D.   On: 07/04/2018 16:49  Dg Chest Port 1 View  Result Date: 07/14/2018 CLINICAL DATA:  Weakness, abdominal pain, nausea EXAM: PORTABLE CHEST 1 VIEW COMPARISON:  Chest CT 02/25/2018 FINDINGS: Heart is normal size. Soft tissue fullness in the left hilar/infrahilar region. Cannot exclude adenopathy or enlarging mass. Right lung clear. No effusions. No acute bony abnormality. IMPRESSION: Soft tissue fullness in the left hilar/infrahilar region. Cannot exclude adenopathy or mass. This could be further evaluated with chest CT with IV Glenn. Electronically Signed   By: Rolm Baptise M.D.   On: 07/14/2018 18:56   Ir Imaging Guided Port Insertion  Result  Date: 07/18/2018 INDICATION: Metastatic colon cancer. In need of durable intravenous access for chemotherapy administration. EXAM: IMPLANTED PORT A CATH PLACEMENT WITH ULTRASOUND AND FLUOROSCOPIC GUIDANCE COMPARISON:  CT of the chest, abdomen and pelvis-02/25/2018 MEDICATIONS: Ancef 2 gm IV; The antibiotic was administered within an appropriate time interval prior to skin puncture. ANESTHESIA/SEDATION: Moderate (conscious) sedation was employed during this procedure. A total of Versed 2 mg and Fentanyl 100 mcg was administered intravenously. Moderate Sedation Time: 27 minutes. The patient's level of consciousness and vital signs were monitored continuously by radiology nursing throughout the procedure under my direct supervision. Glenn:  None FLUOROSCOPY TIME:  18 seconds (6 mGy) COMPLICATIONS: None immediate. PROCEDURE: The procedure, risks, benefits, and alternatives were explained to the patient. Questions regarding the procedure were encouraged and answered. The patient understands and consents to the procedure. The right neck and chest were prepped with chlorhexidine in a sterile fashion, and a sterile drape was applied covering the operative field. Maximum barrier sterile technique with sterile gowns and gloves were used for the procedure. A timeout was performed prior to the initiation of the procedure. Local anesthesia was provided with 1% lidocaine with epinephrine. After creating a small venotomy incision, a micropuncture kit was utilized to access the internal jugular vein. Real-time ultrasound guidance was utilized for vascular access including the acquisition of a permanent ultrasound image documenting patency of the accessed vessel. The microwire was utilized to measure appropriate catheter length. A subcutaneous port pocket was then created along the upper chest wall utilizing a combination of sharp and blunt dissection. The pocket was irrigated with sterile saline. A single lumen thin power  injectable port was chosen for placement. The 8 Fr catheter was tunneled from the port pocket site to the venotomy incision. The port was placed in the pocket. The external catheter was trimmed to appropriate length. At the venotomy, an 8 Fr peel-away sheath was placed over a guidewire under fluoroscopic guidance. The catheter was then placed through the sheath and the sheath was removed. Final catheter positioning was confirmed and documented with a fluoroscopic spot radiograph. The port was accessed with a Huber needle, aspirated and flushed with heparinized saline. The venotomy site was closed with an interrupted 4-0 Vicryl suture. The port pocket incision was closed with interrupted 2-0 Vicryl suture and the skin was opposed with a running subcuticular 4-0 Vicryl suture. Dermabond and Steri-strips were applied to both incisions. Dressings were placed. The patient tolerated the procedure well without immediate post procedural complication. FINDINGS: After catheter placement, the tip lies within the superior cavoatrial junction. The catheter aspirates and flushes normally and is ready for immediate use. IMPRESSION: Successful placement of a right internal jugular approach power injectable Port-A-Cath. The catheter is ready for immediate use. Electronically Signed   By: Sandi Mariscal M.D.   On: 07/18/2018 09:24   [name] Ekg Site Rite  Result Date: 07/14/2018 If [facility]  image not attached, placement could not be confirmed due to current cardiac rhythm.       This patient was seen with [name] with my treatment plan reviewed with him. He expressed agreement with my medical management of this patient.  This was a shared visit with Sandi Mealy.  Allen. Glenn was interviewed and examined.  He presents with persistent nausea and failure to thrive.  He received IV fluids at the Cancer center today.  He will begin a trial of metoclopramide.  He was placed on Diflucan for oral candidiasis. Allen Glenn will return for  a follow-up visit on 08/01/2018.  I discussed the case with radiation oncology.  He will be referred for a repeat brain MRI if his clinical status has not improved 08/01/2018.  Julieanne Manson, MD

## 2018-07-29 ENCOUNTER — Inpatient Hospital Stay: Payer: 59

## 2018-07-29 ENCOUNTER — Inpatient Hospital Stay: Payer: 59 | Admitting: Oncology

## 2018-07-29 DIAGNOSIS — D649 Anemia, unspecified: Secondary | ICD-10-CM | POA: Diagnosis not present

## 2018-07-29 DIAGNOSIS — E119 Type 2 diabetes mellitus without complications: Secondary | ICD-10-CM | POA: Diagnosis not present

## 2018-07-29 DIAGNOSIS — C7931 Secondary malignant neoplasm of brain: Secondary | ICD-10-CM | POA: Diagnosis not present

## 2018-07-29 DIAGNOSIS — E785 Hyperlipidemia, unspecified: Secondary | ICD-10-CM | POA: Diagnosis not present

## 2018-07-29 DIAGNOSIS — I10 Essential (primary) hypertension: Secondary | ICD-10-CM | POA: Diagnosis not present

## 2018-07-29 DIAGNOSIS — I2510 Atherosclerotic heart disease of native coronary artery without angina pectoris: Secondary | ICD-10-CM | POA: Diagnosis not present

## 2018-07-29 DIAGNOSIS — C189 Malignant neoplasm of colon, unspecified: Secondary | ICD-10-CM | POA: Diagnosis not present

## 2018-07-29 DIAGNOSIS — C7951 Secondary malignant neoplasm of bone: Secondary | ICD-10-CM | POA: Diagnosis not present

## 2018-07-29 DIAGNOSIS — G893 Neoplasm related pain (acute) (chronic): Secondary | ICD-10-CM | POA: Diagnosis not present

## 2018-07-31 ENCOUNTER — Other Ambulatory Visit: Payer: Self-pay | Admitting: *Deleted

## 2018-07-31 DIAGNOSIS — I10 Essential (primary) hypertension: Secondary | ICD-10-CM | POA: Diagnosis not present

## 2018-07-31 DIAGNOSIS — I2510 Atherosclerotic heart disease of native coronary artery without angina pectoris: Secondary | ICD-10-CM | POA: Diagnosis not present

## 2018-07-31 DIAGNOSIS — E785 Hyperlipidemia, unspecified: Secondary | ICD-10-CM | POA: Diagnosis not present

## 2018-07-31 DIAGNOSIS — E119 Type 2 diabetes mellitus without complications: Secondary | ICD-10-CM | POA: Diagnosis not present

## 2018-07-31 DIAGNOSIS — C7931 Secondary malignant neoplasm of brain: Secondary | ICD-10-CM | POA: Diagnosis not present

## 2018-07-31 DIAGNOSIS — C7951 Secondary malignant neoplasm of bone: Secondary | ICD-10-CM | POA: Diagnosis not present

## 2018-07-31 DIAGNOSIS — C189 Malignant neoplasm of colon, unspecified: Secondary | ICD-10-CM | POA: Diagnosis not present

## 2018-07-31 DIAGNOSIS — D649 Anemia, unspecified: Secondary | ICD-10-CM | POA: Diagnosis not present

## 2018-07-31 DIAGNOSIS — G893 Neoplasm related pain (acute) (chronic): Secondary | ICD-10-CM | POA: Diagnosis not present

## 2018-07-31 NOTE — Patient Outreach (Signed)
[facility]) Care Management  07/31/2018  Allen Glenn 03-30-1956 [id number]   Discussed case during monthly [facility] Case Management Reviewconference callwithUMR RNCMs. [name] is the spouse of [employer] employee and he was in [facility] 6/12/-6/14 for a scheduled craniotomy with excision of metastatic left cerebello-pontine angle tumor. [name] has known stage IV colon cancer with metastatic disease to the liver (05/04/15), ? lungs (11/08/16),and brain. Update provided to [facility] to include:  recent diagnoses of extensive metastatic disease at T12 and the lumbar vertebral bodies;  MRI of the spine done 06/18/18 also showed extensive infiltration of L3 with a pathologic fracture of the left side of L3 and metastatic disease was also noted at the left iliac and sacrum; he began daily palliative radiation to the T12-S2 levels 06/30/2018 and completed the radiation treatment on 07/11/18.  Update in today's call included details in transition of care call from 7/29 and treatment [facility] 8[facility]5 of 1 liter IV NS with IV Ativan for ongoing dehydration due to nausea and failure to thrive. Treatment for oral candidiasis with Diflucan and oral Ativan and Reglan added to Zofran and phenergan to treat ongoing nausea.  [name] will see his oncologist on 08/01/18 to discuss initiation of chemotherapy.  This RNCM will continue to provide updates on the monthly [facility]MR conference calls as long as patient's case remains active with UMR.  Barrington Ellison RN,CCM,CDE [facility] Management Coordinator Office Phone [phone] Office Fax [phone]

## 2018-08-01 ENCOUNTER — Encounter: Payer: Self-pay | Admitting: Nurse Practitioner

## 2018-08-01 ENCOUNTER — Telehealth: Payer: Self-pay | Admitting: Oncology

## 2018-08-01 ENCOUNTER — Inpatient Hospital Stay (HOSPITAL_BASED_OUTPATIENT_CLINIC_OR_DEPARTMENT_OTHER): Payer: 59 | Admitting: Nurse Practitioner

## 2018-08-01 VITALS — BP 124/74 | HR 99 | Temp 98.7°F | Resp 18 | Ht 74.02 in | Wt 168.3 lb

## 2018-08-01 DIAGNOSIS — R627 Adult failure to thrive: Secondary | ICD-10-CM | POA: Diagnosis not present

## 2018-08-01 DIAGNOSIS — C182 Malignant neoplasm of ascending colon: Secondary | ICD-10-CM

## 2018-08-01 DIAGNOSIS — R200 Anesthesia of skin: Secondary | ICD-10-CM | POA: Diagnosis not present

## 2018-08-01 DIAGNOSIS — C7931 Secondary malignant neoplasm of brain: Secondary | ICD-10-CM | POA: Diagnosis not present

## 2018-08-01 DIAGNOSIS — E860 Dehydration: Secondary | ICD-10-CM | POA: Diagnosis not present

## 2018-08-01 DIAGNOSIS — B370 Candidal stomatitis: Secondary | ICD-10-CM | POA: Diagnosis not present

## 2018-08-01 DIAGNOSIS — C787 Secondary malignant neoplasm of liver and intrahepatic bile duct: Secondary | ICD-10-CM | POA: Diagnosis not present

## 2018-08-01 DIAGNOSIS — C7951 Secondary malignant neoplasm of bone: Secondary | ICD-10-CM

## 2018-08-01 DIAGNOSIS — Z5111 Encounter for antineoplastic chemotherapy: Secondary | ICD-10-CM | POA: Diagnosis not present

## 2018-08-01 DIAGNOSIS — R110 Nausea: Secondary | ICD-10-CM | POA: Diagnosis not present

## 2018-08-01 DIAGNOSIS — C189 Malignant neoplasm of colon, unspecified: Secondary | ICD-10-CM

## 2018-08-01 MED ORDER — DIPHENOXYLATE-ATROPINE 2.5-0.025 MG PO TABS: 2.0000 | ORAL_TABLET | Freq: Four times a day (QID) | ORAL | 0 refills | Status: DC | PRN

## 2018-08-01 NOTE — Telephone Encounter (Signed)
Appointments scheduled AVS/Calendar printed per 8/9 los °

## 2018-08-01 NOTE — Progress Notes (Addendum)
[place] OFFICE PROGRESS NOTE   Diagnosis: Colon cancer  INTERVAL HISTORY:   Mr. Elison returns as scheduled.  He began a trial of metoclopramide for persistent nausea following office visit 07/28/2018.  He is feeling much better since beginning Reglan, Magic mouthwash and Ativan.  He is also completing a course of Diflucan.  He is having no nausea at present.  Appetite is better.  Taste is significantly improved.  Slight improvement in his energy level.  Bowels are moving.  No diarrhea.  No fever.  No falls.  Over the past few days he has noted numbness involving the left hand, mainly the thumb, and the upper left thigh.  He denies back pain.  No neck pain.  Objective:  Vital signs in last 24 hours:  Blood pressure 124/74, pulse 99, temperature 98.7 F (37.1 C), temperature source Oral, resp. rate 18, height 6' 2.02" (1.88 m), weight 168 lb 4.8 oz (76.3 kg), SpO2 99 %.    HEENT: No thrush or ulcers. Resp: Lungs clear bilaterally. Cardio: Regular rate and rhythm. GI: Abdomen soft and nontender.  No hepatomegaly. Vascular: No leg edema. Neuro: Alert.  Follows commands.  Moves all extremities.  Motor strength appears intact. Port-A-Cath site is covered.  No surrounding erythema.  Lab Results:  Lab Results  Component Value Date   WBC 11.7 (H) 07/28/2018   HGB 14.0 07/28/2018   HCT 40.5 07/28/2018   MCV 86.7 07/28/2018   PLT 137 (L) 07/28/2018   NEUTROABS 10.4 (H) 07/28/2018    Imaging:  No results found.  Medications: I have reviewed the patient's current medications.  Assessment/Plan: 1. 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 No BRAFmutation  Liver biopsy 05/04/2015-metastatic adenocarcinoma consistent with a colon primary ? Staging PET scan 06/08/2015-isolated segment 4A liver lesion ? Initiation of adjuvant  FOLFOX 06/13/2015 ? Restaging CT 08/09/2015 revealed a slight decrease in a borderline ileocolic node, decrease in the hepatic dome metastasis, no new lesions ? Liver resection 09/28/2015-pathology consistent with metastatic colon cancer, negative margins ? Adjuvant FOLFOX resumed 11/08/2015, oxaliplatin eliminated beginning 11/22/2015 secondary to neuropathy. He completed adjuvant chemotherapy 02/16/2016 ? Restaging chest CT 11/08/2016, compared to 05/07/2016 revealed a new 9 mm left upper lobe nodule, stable 2.2 cm right hepatic lesion ? PET scan 11/19/2016 confirmed a hypermetabolic left upper lobe nodule, hypermetabolic left paratracheal and pericardiac lymph nodes, and hypermetabolism associated with the hypoattenuating lesion in the dome of the liver ? Status post EBUSbiopsies of the left lingula nodule and a level 4Lnode on 11/28/2016-no evidence of malignancy ? CT chest 02/11/2017-increase in size of the left pulmonary nodule and epicardial lymph node ? Status post SBRT 2 the left lung nodule and mediastinum completed 03/14/2017 ? CTs 07/01/2017-new 9 mm focus along the right liver capsule, stable left subcapsular liver lesion, radiation change at site of left upper lobe nodule, stable pericardiallymph node ? CTs 11/25/2017-new 5 mm lingular nodule, enlargement of capsular-based right liver lesion, new capsular based right liver lesion capsular lesion at the hepatic dome ? CTs 02/25/2018-enlargement of small lung nodules, liver lesions and small mesenteric lymph node ? CT abdomen/pelvis 05/15/2018-new lower lobe pulmonary nodules, enlargement of liver lesions and right lower quadrant soft tissue nodules ? 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 Brain MRI 05/26/2018-3.5 similar left posterior fossa mass, 4 additional subcentimeter enhancing brain lesions-3 cerebellar, 1 left  thalamic ? SRS to all 5 brain lesions  06/02/2018 ? Surgical excision of left cerebellar lesion 06/04/2018, adenocarcinoma consistent with colorectal primary ? MRI lumbar spine 06/18/2018-extensive metastases to the lumbar spine, sacrum, T12, and pelvis, pathologic fracture of L3 ? Initiation of radiation from T12- S2 06/30/2018, completed 07/11/2018 2. Coronary artery disease status post a myocardial infarction in 2014  3. Hypertension  4. Hyperlipidemia  5. Diabetes  6. Admission 05/20/2018 with nausea/vomitingsecondary to a cerebellar metastasis  7.Oral candidiasis 06/11/2018-started on Mycelex atrocious  8.   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 performance status is better.  He is interested in proceeding with chemotherapy.  We discussed the timing of the first cycle with him and his family and decided that he will return for cycle 1 FOLFIRI on 08/12/2018.  In the meantime he will continue to work on nutrition and activity.  A prescription was sent to his pharmacy for Lomotil 2 tablets 4 times a day as needed should he develop diarrhea following the chemotherapy.  The etiology of the left hand and left thigh numbness is unclear.  Plan to continue to monitor for now.  He will return for a follow-up visit and chemotherapy on 08/12/2018.  He will contact the office in the interim with any problems.  Patient seen with [name].  25 minutes were spent face-to-face at today's visit with the majority of that time involved in counseling/coordination of care.  Ned Card ANP/GNP-BC   08/01/2018  11:19 AM  This was a shared visit with Ned Card.  Her Alyea was interviewed and examined.  Mr. Todorov has improved significantly compared to when we saw him earlier this week.  The nausea and appetite are better.  He will continue Reglan.  We discussed the risk/benefit of beginning FOLFIRI.  He would like to wait another week.  He will  return for an office visit and cycle 1 FOLFIRI 08/12/2018.  Julieanne Manson, MD

## 2018-08-04 DIAGNOSIS — C189 Malignant neoplasm of colon, unspecified: Secondary | ICD-10-CM | POA: Diagnosis not present

## 2018-08-04 DIAGNOSIS — E785 Hyperlipidemia, unspecified: Secondary | ICD-10-CM | POA: Diagnosis not present

## 2018-08-04 DIAGNOSIS — E119 Type 2 diabetes mellitus without complications: Secondary | ICD-10-CM | POA: Diagnosis not present

## 2018-08-04 DIAGNOSIS — C7931 Secondary malignant neoplasm of brain: Secondary | ICD-10-CM | POA: Diagnosis not present

## 2018-08-04 DIAGNOSIS — G893 Neoplasm related pain (acute) (chronic): Secondary | ICD-10-CM | POA: Diagnosis not present

## 2018-08-04 DIAGNOSIS — I10 Essential (primary) hypertension: Secondary | ICD-10-CM | POA: Diagnosis not present

## 2018-08-04 DIAGNOSIS — D649 Anemia, unspecified: Secondary | ICD-10-CM | POA: Diagnosis not present

## 2018-08-04 DIAGNOSIS — C7951 Secondary malignant neoplasm of bone: Secondary | ICD-10-CM | POA: Diagnosis not present

## 2018-08-04 DIAGNOSIS — I2510 Atherosclerotic heart disease of native coronary artery without angina pectoris: Secondary | ICD-10-CM | POA: Diagnosis not present

## 2018-08-05 MED FILL — METOCLOPRAMIDE 10 MG TABLET: 10 | 10 days supply | Qty: 30 | Fill #0

## 2018-08-05 MED FILL — FREESTYLE LIBRE 14 DAY SENS: 28 days supply | Qty: 2 | Fill #1

## 2018-08-06 DIAGNOSIS — C7951 Secondary malignant neoplasm of bone: Secondary | ICD-10-CM | POA: Diagnosis not present

## 2018-08-06 DIAGNOSIS — C7931 Secondary malignant neoplasm of brain: Secondary | ICD-10-CM | POA: Diagnosis not present

## 2018-08-06 DIAGNOSIS — E119 Type 2 diabetes mellitus without complications: Secondary | ICD-10-CM | POA: Diagnosis not present

## 2018-08-06 DIAGNOSIS — I10 Essential (primary) hypertension: Secondary | ICD-10-CM | POA: Diagnosis not present

## 2018-08-06 DIAGNOSIS — D649 Anemia, unspecified: Secondary | ICD-10-CM | POA: Diagnosis not present

## 2018-08-06 DIAGNOSIS — I2510 Atherosclerotic heart disease of native coronary artery without angina pectoris: Secondary | ICD-10-CM | POA: Diagnosis not present

## 2018-08-06 DIAGNOSIS — E785 Hyperlipidemia, unspecified: Secondary | ICD-10-CM | POA: Diagnosis not present

## 2018-08-06 DIAGNOSIS — G893 Neoplasm related pain (acute) (chronic): Secondary | ICD-10-CM | POA: Diagnosis not present

## 2018-08-06 DIAGNOSIS — C189 Malignant neoplasm of colon, unspecified: Secondary | ICD-10-CM | POA: Diagnosis not present

## 2018-08-07 ENCOUNTER — Encounter: Payer: Self-pay | Admitting: Oncology

## 2018-08-07 ENCOUNTER — Other Ambulatory Visit: Payer: Self-pay | Admitting: Radiation Therapy

## 2018-08-07 ENCOUNTER — Other Ambulatory Visit: Payer: Self-pay | Admitting: Medical

## 2018-08-07 DIAGNOSIS — C7949 Secondary malignant neoplasm of other parts of nervous system: Principal | ICD-10-CM

## 2018-08-07 DIAGNOSIS — C7931 Secondary malignant neoplasm of brain: Secondary | ICD-10-CM

## 2018-08-07 MED FILL — GABAPENTIN 300 MG CAPSULE: 300 | 30 days supply | Qty: 90 | Fill #1

## 2018-08-08 ENCOUNTER — Encounter: Payer: Self-pay | Admitting: Oncology

## 2018-08-08 ENCOUNTER — Other Ambulatory Visit: Payer: Self-pay | Admitting: Medical

## 2018-08-08 ENCOUNTER — Telehealth: Payer: Self-pay

## 2018-08-08 MED FILL — MAGIC MW (NYS,BEN,MAAL): 12 days supply | Qty: 240 | Fill #1

## 2018-08-08 NOTE — Telephone Encounter (Signed)
Spoke with pt wife regarding appts. Pt wife states that pt is "getting stronger and gaining weight". They would prefer to reschedule appts to the week of the 26th. Ok per [name], will send message to scheduling.

## 2018-08-11 ENCOUNTER — Encounter: Payer: Self-pay | Admitting: Oncology

## 2018-08-11 DIAGNOSIS — C7951 Secondary malignant neoplasm of bone: Secondary | ICD-10-CM | POA: Diagnosis not present

## 2018-08-11 DIAGNOSIS — I10 Essential (primary) hypertension: Secondary | ICD-10-CM | POA: Diagnosis not present

## 2018-08-11 DIAGNOSIS — C189 Malignant neoplasm of colon, unspecified: Secondary | ICD-10-CM | POA: Diagnosis not present

## 2018-08-11 DIAGNOSIS — E785 Hyperlipidemia, unspecified: Secondary | ICD-10-CM | POA: Diagnosis not present

## 2018-08-11 DIAGNOSIS — D649 Anemia, unspecified: Secondary | ICD-10-CM | POA: Diagnosis not present

## 2018-08-11 DIAGNOSIS — I2510 Atherosclerotic heart disease of native coronary artery without angina pectoris: Secondary | ICD-10-CM | POA: Diagnosis not present

## 2018-08-11 DIAGNOSIS — G893 Neoplasm related pain (acute) (chronic): Secondary | ICD-10-CM | POA: Diagnosis not present

## 2018-08-11 DIAGNOSIS — C7931 Secondary malignant neoplasm of brain: Secondary | ICD-10-CM | POA: Diagnosis not present

## 2018-08-11 DIAGNOSIS — E119 Type 2 diabetes mellitus without complications: Secondary | ICD-10-CM | POA: Diagnosis not present

## 2018-08-12 ENCOUNTER — Other Ambulatory Visit: Payer: Self-pay

## 2018-08-12 ENCOUNTER — Inpatient Hospital Stay: Payer: 59 | Admitting: Oncology

## 2018-08-12 ENCOUNTER — Encounter: Payer: Self-pay | Admitting: Nurse Practitioner

## 2018-08-12 ENCOUNTER — Inpatient Hospital Stay: Payer: 59

## 2018-08-12 ENCOUNTER — Telehealth: Payer: Self-pay | Admitting: Oncology

## 2018-08-12 ENCOUNTER — Encounter: Payer: Self-pay | Admitting: Medical

## 2018-08-12 ENCOUNTER — Other Ambulatory Visit: Payer: Self-pay | Admitting: Medical

## 2018-08-12 DIAGNOSIS — C189 Malignant neoplasm of colon, unspecified: Secondary | ICD-10-CM

## 2018-08-12 DIAGNOSIS — C787 Secondary malignant neoplasm of liver and intrahepatic bile duct: Principal | ICD-10-CM

## 2018-08-12 MED ORDER — DIPHENOXYLATE-ATROPINE 2.5-0.025 MG PO TABS: 2.0000 | ORAL_TABLET | Freq: Four times a day (QID) | ORAL | 0 refills | Status: DC | PRN

## 2018-08-12 MED ORDER — LORAZEPAM 0.5 MG PO TABS: 0.5000 mg | ORAL_TABLET | Freq: Three times a day (TID) | ORAL | 0 refills | Status: DC | PRN

## 2018-08-12 MED FILL — DIPHENOXYLATE-ATROPINE 2.5-: 2.5-0.025 | 3 days supply | Qty: 30 | Fill #0

## 2018-08-12 MED FILL — LORazepam 0.5 MG TABS: 0.5 | 10 days supply | Qty: 30 | Fill #0 | Status: TO

## 2018-08-12 NOTE — Telephone Encounter (Signed)
Spoke to patients wife regarding upcoming aug appt updates per 8/19 sch message  °

## 2018-08-13 ENCOUNTER — Telehealth: Payer: Self-pay

## 2018-08-13 ENCOUNTER — Telehealth: Payer: Self-pay | Admitting: Oncology

## 2018-08-13 DIAGNOSIS — C7951 Secondary malignant neoplasm of bone: Secondary | ICD-10-CM | POA: Diagnosis not present

## 2018-08-13 DIAGNOSIS — E785 Hyperlipidemia, unspecified: Secondary | ICD-10-CM | POA: Diagnosis not present

## 2018-08-13 DIAGNOSIS — I2510 Atherosclerotic heart disease of native coronary artery without angina pectoris: Secondary | ICD-10-CM | POA: Diagnosis not present

## 2018-08-13 DIAGNOSIS — C7931 Secondary malignant neoplasm of brain: Secondary | ICD-10-CM | POA: Diagnosis not present

## 2018-08-13 DIAGNOSIS — I10 Essential (primary) hypertension: Secondary | ICD-10-CM | POA: Diagnosis not present

## 2018-08-13 DIAGNOSIS — C189 Malignant neoplasm of colon, unspecified: Secondary | ICD-10-CM | POA: Diagnosis not present

## 2018-08-13 DIAGNOSIS — E119 Type 2 diabetes mellitus without complications: Secondary | ICD-10-CM | POA: Diagnosis not present

## 2018-08-13 DIAGNOSIS — G893 Neoplasm related pain (acute) (chronic): Secondary | ICD-10-CM | POA: Diagnosis not present

## 2018-08-13 DIAGNOSIS — D649 Anemia, unspecified: Secondary | ICD-10-CM | POA: Diagnosis not present

## 2018-08-13 MED ORDER — MIRTAZAPINE 7.5 MG PO TABS: 7.5000 mg | ORAL_TABLET | Freq: Every day | ORAL | 2 refills | Status: DC

## 2018-08-13 MED FILL — MIRTAZAPINE 7.5 MG TABLET: 7.5 | 30 days supply | Qty: 30 | Fill #0

## 2018-08-13 NOTE — Telephone Encounter (Signed)
Mailed patient calendar of appt updates.

## 2018-08-13 NOTE — Telephone Encounter (Signed)
Spoke with pt wife regarding Remeron dosage. Per Dr. Ammie Dalton, ok to continue on 7.5mg  dose. Dose refilled to [facility] outpatient. Wife voiced understanding.

## 2018-08-14 DIAGNOSIS — C7951 Secondary malignant neoplasm of bone: Secondary | ICD-10-CM | POA: Insufficient documentation

## 2018-08-14 NOTE — Addendum Note (Signed)
Encounter addended by: Kyung Rudd, MD on: 08/14/2018 9:04 PM  Actions taken: Problem List modified, Visit diagnoses modified, Sign clinical note

## 2018-08-14 NOTE — Progress Notes (Signed)
  Radiation Oncology         [phone] ________________________________  Name: Allen Glenn MRN: [id number]  Date: 06/25/2018  DOB: [birth year]  SIMULATION AND TREATMENT PLANNING NOTE  DIAGNOSIS:     ICD-10-CM   1. Hip pain, acute, left M25.552 gabapentin (NEURONTIN) 300 MG capsule    DISCONTINUED: oxyCODONE (OXY IR/ROXICODONE) 5 MG immediate release tablet  2. Bone metastasis ([place]) C79.51      Site:  L-spine  NARRATIVE:  The patient was brought to the [place].  Identity was confirmed.  All relevant records and images related to the planned course of therapy were reviewed.   Written consent to proceed with treatment was confirmed which was freely given after reviewing the details related to the planned course of therapy had been reviewed with the patient.  Then, the patient was set-up in a stable reproducible  supine position for radiation therapy.  CT images were obtained.  Surface markings were placed.    Medically necessary complex treatment device(s) for immobilization: Customized Vac-Lok bag.   The CT images were loaded into the planning software.  Then the target and avoidance structures were contoured.  Treatment planning then occurred.  The radiation prescription was entered and confirmed.  A total of 3 complex treatment devices were fabricated which relate to the designed radiation treatment fields. Each of these customized fields/ complex treatment devices will be used on a daily basis during the radiation course. I have requested : Isodose Plan.   PLAN:  The patient will receive 30 Gy in 10 fractions.  ________________________________   Jodelle Gross, MD, PhD

## 2018-08-15 DIAGNOSIS — E119 Type 2 diabetes mellitus without complications: Secondary | ICD-10-CM | POA: Diagnosis not present

## 2018-08-15 DIAGNOSIS — C7951 Secondary malignant neoplasm of bone: Secondary | ICD-10-CM | POA: Diagnosis not present

## 2018-08-15 DIAGNOSIS — E785 Hyperlipidemia, unspecified: Secondary | ICD-10-CM | POA: Diagnosis not present

## 2018-08-15 DIAGNOSIS — I10 Essential (primary) hypertension: Secondary | ICD-10-CM | POA: Diagnosis not present

## 2018-08-15 DIAGNOSIS — D649 Anemia, unspecified: Secondary | ICD-10-CM | POA: Diagnosis not present

## 2018-08-15 DIAGNOSIS — G893 Neoplasm related pain (acute) (chronic): Secondary | ICD-10-CM | POA: Diagnosis not present

## 2018-08-15 DIAGNOSIS — C189 Malignant neoplasm of colon, unspecified: Secondary | ICD-10-CM | POA: Diagnosis not present

## 2018-08-15 DIAGNOSIS — C7931 Secondary malignant neoplasm of brain: Secondary | ICD-10-CM | POA: Diagnosis not present

## 2018-08-15 DIAGNOSIS — I2510 Atherosclerotic heart disease of native coronary artery without angina pectoris: Secondary | ICD-10-CM | POA: Diagnosis not present

## 2018-08-18 DIAGNOSIS — C7931 Secondary malignant neoplasm of brain: Secondary | ICD-10-CM | POA: Diagnosis not present

## 2018-08-18 DIAGNOSIS — E785 Hyperlipidemia, unspecified: Secondary | ICD-10-CM | POA: Diagnosis not present

## 2018-08-18 DIAGNOSIS — I2510 Atherosclerotic heart disease of native coronary artery without angina pectoris: Secondary | ICD-10-CM | POA: Diagnosis not present

## 2018-08-18 DIAGNOSIS — D649 Anemia, unspecified: Secondary | ICD-10-CM | POA: Diagnosis not present

## 2018-08-18 DIAGNOSIS — I10 Essential (primary) hypertension: Secondary | ICD-10-CM | POA: Diagnosis not present

## 2018-08-18 DIAGNOSIS — E119 Type 2 diabetes mellitus without complications: Secondary | ICD-10-CM | POA: Diagnosis not present

## 2018-08-18 DIAGNOSIS — C189 Malignant neoplasm of colon, unspecified: Secondary | ICD-10-CM | POA: Diagnosis not present

## 2018-08-18 DIAGNOSIS — G893 Neoplasm related pain (acute) (chronic): Secondary | ICD-10-CM | POA: Diagnosis not present

## 2018-08-18 DIAGNOSIS — C7951 Secondary malignant neoplasm of bone: Secondary | ICD-10-CM | POA: Diagnosis not present

## 2018-08-19 ENCOUNTER — Other Ambulatory Visit: Payer: Self-pay

## 2018-08-19 ENCOUNTER — Inpatient Hospital Stay: Payer: 59

## 2018-08-19 ENCOUNTER — Ambulatory Visit: Payer: 59

## 2018-08-19 ENCOUNTER — Telehealth: Payer: Self-pay | Admitting: *Deleted

## 2018-08-19 ENCOUNTER — Inpatient Hospital Stay (HOSPITAL_BASED_OUTPATIENT_CLINIC_OR_DEPARTMENT_OTHER): Payer: 59 | Admitting: Oncology

## 2018-08-19 VITALS — BP 120/70 | HR 106 | Temp 97.9°F | Resp 18 | Ht 74.02 in | Wt 175.1 lb

## 2018-08-19 VITALS — HR 99

## 2018-08-19 DIAGNOSIS — E119 Type 2 diabetes mellitus without complications: Secondary | ICD-10-CM | POA: Diagnosis not present

## 2018-08-19 DIAGNOSIS — C7949 Secondary malignant neoplasm of other parts of nervous system: Secondary | ICD-10-CM

## 2018-08-19 DIAGNOSIS — C7931 Secondary malignant neoplasm of brain: Secondary | ICD-10-CM

## 2018-08-19 DIAGNOSIS — Z794 Long term (current) use of insulin: Secondary | ICD-10-CM | POA: Diagnosis not present

## 2018-08-19 DIAGNOSIS — R197 Diarrhea, unspecified: Secondary | ICD-10-CM | POA: Diagnosis not present

## 2018-08-19 DIAGNOSIS — C189 Malignant neoplasm of colon, unspecified: Secondary | ICD-10-CM

## 2018-08-19 DIAGNOSIS — C182 Malignant neoplasm of ascending colon: Secondary | ICD-10-CM

## 2018-08-19 DIAGNOSIS — B370 Candidal stomatitis: Secondary | ICD-10-CM | POA: Diagnosis not present

## 2018-08-19 DIAGNOSIS — C7951 Secondary malignant neoplasm of bone: Secondary | ICD-10-CM

## 2018-08-19 DIAGNOSIS — C787 Secondary malignant neoplasm of liver and intrahepatic bile duct: Secondary | ICD-10-CM

## 2018-08-19 DIAGNOSIS — R627 Adult failure to thrive: Secondary | ICD-10-CM | POA: Diagnosis not present

## 2018-08-19 DIAGNOSIS — E860 Dehydration: Secondary | ICD-10-CM | POA: Diagnosis not present

## 2018-08-19 DIAGNOSIS — Z5111 Encounter for antineoplastic chemotherapy: Secondary | ICD-10-CM | POA: Diagnosis not present

## 2018-08-19 DIAGNOSIS — R110 Nausea: Secondary | ICD-10-CM | POA: Diagnosis not present

## 2018-08-19 LAB — CEA (IN HOUSE-CHCC): CEA (CHCC-IN HOUSE): 71.41 ng/mL — AB (ref 0.00–5.00)

## 2018-08-19 LAB — CBC WITH DIFFERENTIAL (CANCER CENTER ONLY)
BASOS PCT: 0 %
Basophils Absolute: 0 10*3/uL (ref 0.0–0.1)
EOS PCT: 0 %
Eosinophils Absolute: 0 10*3/uL (ref 0.0–0.5)
HCT: 37.7 % — ABNORMAL LOW (ref 38.4–49.9)
Hemoglobin: 12.5 g/dL — ABNORMAL LOW (ref 13.0–17.1)
LYMPHS PCT: 5 %
Lymphs Abs: 0.4 10*3/uL — ABNORMAL LOW (ref 0.9–3.3)
MCH: 30.3 pg (ref 27.2–33.4)
MCHC: 33.2 g/dL (ref 32.0–36.0)
MCV: 91.5 fL (ref 79.3–98.0)
MONO ABS: 0.1 10*3/uL (ref 0.1–0.9)
Monocytes Relative: 2 %
Neutro Abs: 6.2 10*3/uL (ref 1.5–6.5)
Neutrophils Relative %: 93 %
Platelet Count: 105 10*3/uL — ABNORMAL LOW (ref 140–400)
RBC: 4.12 MIL/uL — AB (ref 4.20–5.82)
RDW: 18.4 % — AB (ref 11.0–14.6)
WBC: 6.7 10*3/uL (ref 4.0–10.3)

## 2018-08-19 LAB — CMP (CANCER CENTER ONLY)
ALT: 62 U/L — ABNORMAL HIGH (ref 0–44)
AST: 32 U/L (ref 15–41)
Albumin: 2.9 g/dL — ABNORMAL LOW (ref 3.5–5.0)
Alkaline Phosphatase: 689 U/L — ABNORMAL HIGH (ref 38–126)
Anion gap: 12 (ref 5–15)
BUN: 27 mg/dL — AB (ref 8–23)
CHLORIDE: 101 mmol/L (ref 98–111)
CO2: 20 mmol/L — ABNORMAL LOW (ref 22–32)
Calcium: 8.4 mg/dL — ABNORMAL LOW (ref 8.9–10.3)
Creatinine: 0.61 mg/dL (ref 0.61–1.24)
GFR, Est AFR Am: >60 mL/min (ref >60)
Glucose, Bld: 343 mg/dL — ABNORMAL HIGH (ref 70–99)
POTASSIUM: 4.3 mmol/L (ref 3.5–5.1)
Sodium: 133 mmol/L — ABNORMAL LOW (ref 135–145)
Total Bilirubin: 0.3 mg/dL (ref 0.3–1.2)
Total Protein: 5.9 g/dL — ABNORMAL LOW (ref 6.5–8.1)

## 2018-08-19 MED ORDER — LEUCOVORIN CALCIUM INJECTION 350 MG
400.0000 mg/m2 | Freq: Once | INTRAVENOUS | Status: AC
  Administered 2018-08-19: 800 mg via INTRAVENOUS
  Filled 2018-08-19: qty 40

## 2018-08-19 MED ORDER — SODIUM CHLORIDE 0.9 % IV SOLN
Freq: Once | INTRAVENOUS | Status: AC
  Administered 2018-08-19: 10:00:00 via INTRAVENOUS
  Filled 2018-08-19: qty 250

## 2018-08-19 MED ORDER — IRINOTECAN HCL CHEMO INJECTION 100 MG/5ML
140.0000 mg/m2 | Freq: Once | INTRAVENOUS | Status: AC
  Administered 2018-08-19: 280 mg via INTRAVENOUS
  Filled 2018-08-19: qty 14

## 2018-08-19 MED ORDER — PALONOSETRON HCL INJECTION 0.25 MG/5ML
INTRAVENOUS | Status: AC
  Filled 2018-08-19: qty 5

## 2018-08-19 MED ORDER — ATROPINE SULFATE 1 MG/ML IJ SOLN
INTRAMUSCULAR | Status: AC
  Filled 2018-08-19: qty 1

## 2018-08-19 MED ORDER — DEXAMETHASONE SODIUM PHOSPHATE 10 MG/ML IJ SOLN
10.0000 mg | Freq: Once | INTRAMUSCULAR | Status: AC
  Administered 2018-08-19: 10 mg via INTRAVENOUS

## 2018-08-19 MED ORDER — ATROPINE SULFATE 1 MG/ML IJ SOLN
0.5000 mg | Freq: Once | INTRAMUSCULAR | Status: AC | PRN
  Administered 2018-08-19: 0.5 mg via INTRAVENOUS

## 2018-08-19 MED ORDER — SODIUM CHLORIDE 0.9 % IV SOLN
2400.0000 mg/m2 | INTRAVENOUS | Status: DC
  Administered 2018-08-19: 4800 mg via INTRAVENOUS
  Filled 2018-08-19: qty 96

## 2018-08-19 MED ORDER — LORAZEPAM 0.5 MG PO TABS: 0.5000 mg | ORAL_TABLET | Freq: Three times a day (TID) | ORAL | 0 refills | Status: DC | PRN

## 2018-08-19 MED ORDER — DEXAMETHASONE SODIUM PHOSPHATE 10 MG/ML IJ SOLN
INTRAMUSCULAR | Status: AC
  Filled 2018-08-19: qty 1

## 2018-08-19 MED ORDER — PALONOSETRON HCL INJECTION 0.25 MG/5ML
0.2500 mg | Freq: Once | INTRAVENOUS | Status: AC
  Administered 2018-08-19: 0.25 mg via INTRAVENOUS

## 2018-08-19 MED ORDER — SODIUM CHLORIDE 0.9% FLUSH
10.0000 mL | INTRAVENOUS | Status: DC | PRN
  Administered 2018-08-19: 10 mL
  Filled 2018-08-19: qty 10

## 2018-08-19 MED ORDER — PROCHLORPERAZINE MALEATE 5 MG PO TABS: 5.0000 mg | ORAL_TABLET | Freq: Four times a day (QID) | ORAL | 1 refills | Status: DC | PRN

## 2018-08-19 MED FILL — LORazepam 0.5 MG TABS: 0.5 | 8 days supply | Qty: 30 | Fill #0

## 2018-08-19 MED FILL — PROCHLORPERAZINE 5 MG TAB: 5 | 11 days supply | Qty: 45 | Fill #0

## 2018-08-19 NOTE — Telephone Encounter (Signed)
"  [facility] [phone]) calling in reference to Prochlorperazine order received today.  Metoclopramide ordered 07-28-2018 and prochlorperazine not verified.  Prochlorperazine not filled but placed on hold July 28, 2018.  Need clarification.  Call in reference to what to do with the Metoclopramide."

## 2018-08-19 NOTE — Progress Notes (Signed)
[place] OFFICE PROGRESS NOTE   Diagnosis: Colon cancer  INTERVAL HISTORY:   Allen Glenn returns for a scheduled visit.  His clinical status has improved over the past few weeks.  He is working with physical therapy at home.  He is ambulating.  Minimal back pain.  He is not taking pain medication consistently.  No nausea.  Good appetite.  He continues Decadron.  Objective:  Vital signs in last 24 hours:  Blood pressure 120/70, pulse (!) 106, temperature 97.9 F (36.6 C), temperature source Oral, resp. rate 18, height 6' 2.02" (1.88 m), weight 175 lb 1.6 oz (79.4 kg), SpO2 99 %.    HEENT: Mild thrush at the buccal mucosa and pharynx. Resp: Lungs clear bilaterally Cardio: Regular rate and rhythm GI: Nontender, no hepatosplenomegaly Vascular: No leg edema Neuro: Alert and oriented, the motor exam appears intact in the upper and lower extremities bilaterally, he ambulated to the examination table without assistance Skin: Radiation hyperpigmentation of the lower back  Portacath/PICC-without erythema  Lab Results:  Lab Results  Component Value Date   WBC 6.7 08/19/2018   HGB 12.5 (L) 08/19/2018   HCT 37.7 (L) 08/19/2018   MCV 91.5 08/19/2018   PLT 105 (L) 08/19/2018   NEUTROABS 6.2 08/19/2018    CMP  Lab Results  Component Value Date   NA 133 (L) 08/19/2018   K 4.3 08/19/2018   CL 101 08/19/2018   CO2 20 (L) 08/19/2018   GLUCOSE 343 (H) 08/19/2018   BUN 27 (H) 08/19/2018   CREATININE 0.61 08/19/2018   CALCIUM 8.4 (L) 08/19/2018   PROT 5.9 (L) 08/19/2018   ALBUMIN 2.9 (L) 08/19/2018   AST 32 08/19/2018   ALT 62 (H) 08/19/2018   ALKPHOS 689 (H) 08/19/2018   BILITOT 0.3 08/19/2018   GFRNONAA >60 08/19/2018   GFRAA >60 08/19/2018    Lab Results  Component Value Date   CEA1 31.01 (H) 07/02/2018    Medications: I have reviewed the patient's current medications.   Assessment/Plan: 1. Stage IV (pT3,pN2b,M1) sees moderately differentiated  adenocarcinoma of the right colon, status post a right colectomy 05/04/2015, Foundation 1 testing-MSI-stable, K-ras G12Cmutations. No BRAFmutation  Liver biopsy 05/04/2015-metastatic adenocarcinoma consistent with a colon primary ? Staging PET scan 06/08/2015-isolated segment 4A liver lesion ? Initiation of adjuvant FOLFOX 06/13/2015 ? Restaging CT 08/09/2015 revealed a slight decrease in a borderline ileocolic node, decrease in the hepatic dome metastasis, no new lesions ? Liver resection 09/28/2015-pathology consistent with metastatic colon cancer, negative margins ? Adjuvant FOLFOX resumed 11/08/2015, oxaliplatin eliminated beginning 11/22/2015 secondary to neuropathy. He completed adjuvant chemotherapy 02/16/2016 ? Restaging chest CT 11/08/2016, compared to 05/07/2016 revealed a new 9 mm left upper lobe nodule, stable 2.2 cm right hepatic lesion ? PET scan 11/19/2016 confirmed a hypermetabolic left upper lobe nodule, hypermetabolic left paratracheal and pericardiac lymph nodes, and hypermetabolism associated with the hypoattenuating lesion in the dome of the liver ? Status post EBUSbiopsies of the left lingula nodule and a level 4Lnode on 11/28/2016-no evidence of malignancy ? CT chest 02/11/2017-increase in size of the left pulmonary nodule and epicardial lymph node ? Status post SBRT 2 the left lung nodule and mediastinum completed 03/14/2017 ? CTs 07/01/2017-new 9 mm focus along the right liver capsule, stable left subcapsular liver lesion, radiation change at site of left upper lobe nodule, stable pericardiallymph node ? CTs 11/25/2017-new 5 mm lingular nodule, enlargement of capsular-based right liver lesion, new capsular based right liver lesion capsular lesion at the hepatic  dome ? CTs 02/25/2018-enlargement of small lung nodules, liver lesions and small mesenteric lymph node ? CT abdomen/pelvis 05/15/2018-new lower lobe pulmonary nodules, enlargement of liver lesions and right lower  quadrant soft tissue nodules ? 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 Brain MRI 05/26/2018-3.5 similar left posterior fossa mass, 4 additional subcentimeter enhancing brain lesions-3 cerebellar, 1 left thalamic ? SRS to all 5 brain lesions 06/02/2018 ? Surgical excision of left cerebellar lesion 06/04/2018, adenocarcinoma consistent with colorectal primary ? MRI lumbar spine 06/18/2018-extensive metastases to the lumbar spine, sacrum, T12, and pelvis, pathologic fracture of L3 ? Initiation of radiation from T12-S2 06/30/2018, completed 07/11/2018 ? Cycle 1 FOLFIRI 08/19/2018 2. Coronary artery disease status post a myocardial infarction in 2014  3. Hypertension  4. Hyperlipidemia  5. Diabetes  6. Admission 05/20/2018 with nausea/vomitingsecondary to a cerebellar metastasis  7.Oral candidiasis 06/11/2018-started on Mycelex troches  8. 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 Hooton has an improved performance status.  The plan is to begin FOLFIRI today.  We reviewed potential toxicities associated with FOLFIRI and he agrees to proceed. He will use Lomotil and Imodium for diarrhea.  He will contact us for area not relieved with Lomotil or Imodium. He has mild oral candidiasis.  He will continue Mycelex troches.  We will add Diflucan if the thrush worsens.  He taper the Decadron to once daily. Mr. Langston will monitor his blood sugar closely and administer additional regular insulin as needed.  He will return for an office visit and chemotherapy in 2 weeks.  30 minutes were spent with the patient today.  The majority of the time was used for counseling and coordination of care.  Betsy Coder, MD  08/19/2018  9:32 AM

## 2018-08-19 NOTE — Patient Instructions (Signed)
[facility] Discharge Instructions for Patients Receiving Chemotherapy  Today you received the following chemotherapy agents irinotecan (Camptosar), leucovorin, flourouracil (Adrucil pump).   To help prevent nausea and vomiting after your treatment, we encourage you to take your nausea medication as directed by your physician.   If you develop nausea and vomiting that is not controlled by your nausea medication, call the clinic.   BELOW ARE SYMPTOMS THAT SHOULD BE REPORTED IMMEDIATELY:  *FEVER GREATER THAN 100.5 F  *CHILLS WITH OR WITHOUT FEVER  NAUSEA AND VOMITING THAT IS NOT CONTROLLED WITH YOUR NAUSEA MEDICATION  *UNUSUAL SHORTNESS OF BREATH  *UNUSUAL BRUISING OR BLEEDING  TENDERNESS IN MOUTH AND THROAT WITH OR WITHOUT PRESENCE OF ULCERS  *URINARY PROBLEMS  *BOWEL PROBLEMS  UNUSUAL RASH Items with * indicate a potential emergency and should be followed up as soon as possible.  Feel free to call the clinic should you have any questions or concerns. The clinic phone number is [phone].  Please show the [place] at check-in to the Emergency Department and triage nurse.  Leucovorin injection What is this medicine? LEUCOVORIN (loo koe VOR in) is used to prevent or treat the harmful effects of some medicines. This medicine is used to treat anemia caused by a low amount of folic acid in the body. It is also used with 5-fluorouracil (5-FU) to treat colon cancer. This medicine may be used for other purposes; ask your health care provider or pharmacist if you have questions. What should I tell my health care provider before I take this medicine? They need to know if you have any of these conditions: -anemia from low levels of vitamin B-12 in the blood -an unusual or allergic reaction to leucovorin, folic acid, other medicines, foods, dyes, or preservatives -pregnant or trying to get pregnant -breast-feeding How should I use this medicine? This  medicine is for injection into a muscle or into a vein. It is given by a health care professional in a hospital or clinic setting. Talk to your pediatrician regarding the use of this medicine in children. Special care may be needed. Overdosage: If you think you have taken too much of this medicine contact a poison control center or emergency room at once. NOTE: This medicine is only for you. Do not share this medicine with others. What if I miss a dose? This does not apply. What may interact with this medicine? -capecitabine -fluorouracil -phenobarbital -phenytoin -primidone -trimethoprim-sulfamethoxazole This list may not describe all possible interactions. Give your health care provider a list of all the medicines, herbs, non-prescription drugs, or dietary supplements you use. Also tell them if you smoke, drink alcohol, or use illegal drugs. Some items may interact with your medicine. What should I watch for while using this medicine? Your condition will be monitored carefully while you are receiving this medicine. This medicine may increase the side effects of 5-fluorouracil, 5-FU. Tell your doctor or health care professional if you have diarrhea or mouth sores that do not get better or that get worse. What side effects may I notice from receiving this medicine? Side effects that you should report to your doctor or health care professional as soon as possible: -allergic reactions like skin rash, itching or hives, swelling of the face, lips, or tongue -breathing problems -fever, infection -mouth sores -unusual bleeding or bruising -unusually weak or tired Side effects that usually do not require medical attention (report to your doctor or health care professional if they continue or are bothersome): -  constipation or diarrhea -loss of appetite -nausea, vomiting This list may not describe all possible side effects. Call your doctor for medical advice about side effects. You may report  side effects to FDA at 1-800-FDA-1088. Where should I keep my medicine? This drug is given in a hospital or clinic and will not be stored at home. NOTE: This sheet is a summary. It may not cover all possible information. If you have questions about this medicine, talk to your doctor, pharmacist, or health care provider.  2018 Elsevier/Gold Standard (2008-06-15 16:50:29)  Irinotecan injection What is this medicine? IRINOTECAN (ir in oh TEE kan ) is a chemotherapy drug. It is used to treat colon and rectal cancer. This medicine may be used for other purposes; ask your health care provider or pharmacist if you have questions. COMMON BRAND NAME(S): Camptosar What should I tell my health care provider before I take this medicine? They need to know if you have any of these conditions: -blood disorders -dehydration -diarrhea -infection (especially a virus infection such as chickenpox, cold sores, or herpes) -liver disease -low blood counts, like low white cell, platelet, or red cell counts -recent or ongoing radiation therapy -an unusual or allergic reaction to irinotecan, sorbitol, other chemotherapy, other medicines, foods, dyes, or preservatives -pregnant or trying to get pregnant -breast-feeding How should I use this medicine? This drug is given as an infusion into a vein. It is administered in a hospital or clinic by a specially trained health care professional. Talk to your pediatrician regarding the use of this medicine in children. Special care may be needed. Overdosage: If you think you have taken too much of this medicine contact a poison control center or emergency room at once. NOTE: This medicine is only for you. Do not share this medicine with others. What if I miss a dose? It is important not to miss your dose. Call your doctor or health care professional if you are unable to keep an appointment. What may interact with this medicine? Do not take this medicine with any of the  following medications: -atazanavir -certain medicines for fungal infections like itraconazole and ketoconazole -St. John's Wort This medicine may also interact with the following medications: -dexamethasone -diuretics -laxatives -medicines for seizures like carbamazepine, mephobarbital, phenobarbital, phenytoin, primidone -medicines to increase blood counts like filgrastim, pegfilgrastim, sargramostim -prochlorperazine -vaccines This list may not describe all possible interactions. Give your health care provider a list of all the medicines, herbs, non-prescription drugs, or dietary supplements you use. Also tell them if you smoke, drink alcohol, or use illegal drugs. Some items may interact with your medicine. What should I watch for while using this medicine? Your condition will be monitored carefully while you are receiving this medicine. You will need important blood work done while you are taking this medicine. This drug may make you feel generally unwell. This is not uncommon, as chemotherapy can affect healthy cells as well as cancer cells. Report any side effects. Continue your course of treatment even though you feel ill unless your doctor tells you to stop. In some cases, you may be given additional medicines to help with side effects. Follow all directions for their use. You may get drowsy or dizzy. Do not drive, use machinery, or do anything that needs mental alertness until you know how this medicine affects you. Do not stand or sit up quickly, especially if you are an older patient. This reduces the risk of dizzy or fainting spells. Call your doctor or health care  professional for advice if you get a fever, chills or sore throat, or other symptoms of a cold or flu. Do not treat yourself. This drug decreases your body's ability to fight infections. Try to avoid being around people who are sick. This medicine may increase your risk to bruise or bleed. Call your doctor or health care  professional if you notice any unusual bleeding. Be careful brushing and flossing your teeth or using a toothpick because you may get an infection or bleed more easily. If you have any dental work done, tell your dentist you are receiving this medicine. Avoid taking products that contain aspirin, acetaminophen, ibuprofen, naproxen, or ketoprofen unless instructed by your doctor. These medicines may hide a fever. Do not become pregnant while taking this medicine. Women should inform their doctor if they wish to become pregnant or think they might be pregnant. There is a potential for serious side effects to an unborn child. Talk to your health care professional or pharmacist for more information. Do not breast-feed an infant while taking this medicine. What side effects may I notice from receiving this medicine? Side effects that you should report to your doctor or health care professional as soon as possible: -allergic reactions like skin rash, itching or hives, swelling of the face, lips, or tongue -low blood counts - this medicine may decrease the number of white blood cells, red blood cells and platelets. You may be at increased risk for infections and bleeding. -signs of infection - fever or chills, cough, sore throat, pain or difficulty passing urine -signs of decreased platelets or bleeding - bruising, pinpoint red spots on the skin, black, tarry stools, blood in the urine -signs of decreased red blood cells - unusually weak or tired, fainting spells, lightheadedness -breathing problems -chest pain -diarrhea -feeling faint or lightheaded, falls -flushing, runny nose, sweating during infusion -mouth sores or pain -pain, swelling, redness or irritation where injected -pain, swelling, warmth in the leg -pain, tingling, numbness in the hands or feet -problems with balance, talking, walking -stomach cramps, pain -trouble passing urine or change in the amount of urine -vomiting as to be unable  to hold down drinks or food -yellowing of the eyes or skin Side effects that usually do not require medical attention (report to your doctor or health care professional if they continue or are bothersome): -constipation -hair loss -headache -loss of appetite -nausea, vomiting -stomach upset This list may not describe all possible side effects. Call your doctor for medical advice about side effects. You may report side effects to FDA at 1-800-FDA-1088. Where should I keep my medicine? This drug is given in a hospital or clinic and will not be stored at home. NOTE: This sheet is a summary. It may not cover all possible information. If you have questions about this medicine, talk to your doctor, pharmacist, or health care provider.  2018 Elsevier/Gold Standard (2013-06-08 16:29:32)  Fluorouracil, 5-FU injection What is this medicine? FLUOROURACIL, 5-FU (flure oh YOOR a sil) is a chemotherapy drug. It slows the growth of cancer cells. This medicine is used to treat many types of cancer like breast cancer, colon or rectal cancer, pancreatic cancer, and stomach cancer. This medicine may be used for other purposes; ask your health care provider or pharmacist if you have questions. COMMON BRAND NAME(S): Adrucil What should I tell my health care provider before I take this medicine? They need to know if you have any of these conditions: -blood disorders -dihydropyrimidine dehydrogenase (DPD) deficiency -infection (especially  a virus infection such as chickenpox, cold sores, or herpes) -kidney disease -liver disease -malnourished, poor nutrition -recent or ongoing radiation therapy -an unusual or allergic reaction to fluorouracil, other chemotherapy, other medicines, foods, dyes, or preservatives -pregnant or trying to get pregnant -breast-feeding How should I use this medicine? This drug is given as an infusion or injection into a vein. It is administered in a hospital or clinic by a  specially trained health care professional. Talk to your pediatrician regarding the use of this medicine in children. Special care may be needed. Overdosage: If you think you have taken too much of this medicine contact a poison control center or emergency room at once. NOTE: This medicine is only for you. Do not share this medicine with others. What if I miss a dose? It is important not to miss your dose. Call your doctor or health care professional if you are unable to keep an appointment. What may interact with this medicine? -allopurinol -cimetidine -dapsone -digoxin -hydroxyurea -leucovorin -levamisole -medicines for seizures like ethotoin, fosphenytoin, phenytoin -medicines to increase blood counts like filgrastim, pegfilgrastim, sargramostim -medicines that treat or prevent blood clots like warfarin, enoxaparin, and dalteparin -methotrexate -metronidazole -pyrimethamine -some other chemotherapy drugs like busulfan, cisplatin, estramustine, vinblastine -trimethoprim -trimetrexate -vaccines Talk to your doctor or health care professional before taking any of these medicines: -acetaminophen -aspirin -ibuprofen -ketoprofen -naproxen This list may not describe all possible interactions. Give your health care provider a list of all the medicines, herbs, non-prescription drugs, or dietary supplements you use. Also tell them if you smoke, drink alcohol, or use illegal drugs. Some items may interact with your medicine. What should I watch for while using this medicine? Visit your doctor for checks on your progress. This drug may make you feel generally unwell. This is not uncommon, as chemotherapy can affect healthy cells as well as cancer cells. Report any side effects. Continue your course of treatment even though you feel ill unless your doctor tells you to stop. In some cases, you may be given additional medicines to help with side effects. Follow all directions for their  use. Call your doctor or health care professional for advice if you get a fever, chills or sore throat, or other symptoms of a cold or flu. Do not treat yourself. This drug decreases your body's ability to fight infections. Try to avoid being around people who are sick. This medicine may increase your risk to bruise or bleed. Call your doctor or health care professional if you notice any unusual bleeding. Be careful brushing and flossing your teeth or using a toothpick because you may get an infection or bleed more easily. If you have any dental work done, tell your dentist you are receiving this medicine. Avoid taking products that contain aspirin, acetaminophen, ibuprofen, naproxen, or ketoprofen unless instructed by your doctor. These medicines may hide a fever. Do not become pregnant while taking this medicine. Women should inform their doctor if they wish to become pregnant or think they might be pregnant. There is a potential for serious side effects to an unborn child. Talk to your health care professional or pharmacist for more information. Do not breast-feed an infant while taking this medicine. Men should inform their doctor if they wish to father a child. This medicine may lower sperm counts. Do not treat diarrhea with over the counter products. Contact your doctor if you have diarrhea that lasts more than 2 days or if it is severe and watery. This medicine can make  you more sensitive to the sun. Keep out of the sun. If you cannot avoid being in the sun, wear protective clothing and use sunscreen. Do not use sun lamps or tanning beds/booths. What side effects may I notice from receiving this medicine? Side effects that you should report to your doctor or health care professional as soon as possible: -allergic reactions like skin rash, itching or hives, swelling of the face, lips, or tongue -low blood counts - this medicine may decrease the number of white blood cells, red blood cells and  platelets. You may be at increased risk for infections and bleeding. -signs of infection - fever or chills, cough, sore throat, pain or difficulty passing urine -signs of decreased platelets or bleeding - bruising, pinpoint red spots on the skin, black, tarry stools, blood in the urine -signs of decreased red blood cells - unusually weak or tired, fainting spells, lightheadedness -breathing problems -changes in vision -chest pain -mouth sores -nausea and vomiting -pain, swelling, redness at site where injected -pain, tingling, numbness in the hands or feet -redness, swelling, or sores on hands or feet -stomach pain -unusual bleeding Side effects that usually do not require medical attention (report to your doctor or health care professional if they continue or are bothersome): -changes in finger or toe nails -diarrhea -dry or itchy skin -hair loss -headache -loss of appetite -sensitivity of eyes to the light -stomach upset -unusually teary eyes This list may not describe all possible side effects. Call your doctor for medical advice about side effects. You may report side effects to FDA at 1-800-FDA-1088. Where should I keep my medicine? This drug is given in a hospital or clinic and will not be stored at home. NOTE: This sheet is a summary. It may not cover all possible information. If you have questions about this medicine, talk to your doctor, pharmacist, or health care provider.  2018 Elsevier/Gold Standard (2008-04-14 13:53:16)

## 2018-08-19 NOTE — Addendum Note (Signed)
Addended by: Mathis Fare on: 08/19/2018 10:10 AM   Modules accepted: Orders

## 2018-08-19 NOTE — Progress Notes (Signed)
Spoke with Butch Penny at [facility] outpatient pharmacy to refill Ativan and also regarding compazine. Butch Penny to call pt to inform that refill will be available at [facility]. This RN voiced understanding.   Also spoke with pt wife regarding decadron. Pt to take 4mg  daily for 1 week, then to taper to 2mg  daily until next MD visit. Pt wife voiced understanding

## 2018-08-20 ENCOUNTER — Other Ambulatory Visit: Payer: Self-pay | Admitting: Oncology

## 2018-08-20 MED FILL — CLOTRIMAZOLE 10 MG TROCHE: 10 | 29 days supply | Qty: 117 | Fill #0

## 2018-08-21 ENCOUNTER — Other Ambulatory Visit: Payer: Self-pay | Admitting: *Deleted

## 2018-08-21 ENCOUNTER — Inpatient Hospital Stay: Payer: 59 | Admitting: Nutrition

## 2018-08-21 ENCOUNTER — Inpatient Hospital Stay: Payer: 59

## 2018-08-21 VITALS — BP 113/77 | HR 103 | Temp 98.3°F | Resp 18

## 2018-08-21 DIAGNOSIS — C182 Malignant neoplasm of ascending colon: Secondary | ICD-10-CM | POA: Diagnosis not present

## 2018-08-21 DIAGNOSIS — C189 Malignant neoplasm of colon, unspecified: Secondary | ICD-10-CM

## 2018-08-21 DIAGNOSIS — E860 Dehydration: Secondary | ICD-10-CM | POA: Diagnosis not present

## 2018-08-21 DIAGNOSIS — C7931 Secondary malignant neoplasm of brain: Secondary | ICD-10-CM | POA: Diagnosis not present

## 2018-08-21 DIAGNOSIS — C7951 Secondary malignant neoplasm of bone: Secondary | ICD-10-CM | POA: Diagnosis not present

## 2018-08-21 DIAGNOSIS — Z5111 Encounter for antineoplastic chemotherapy: Secondary | ICD-10-CM | POA: Diagnosis not present

## 2018-08-21 DIAGNOSIS — B370 Candidal stomatitis: Secondary | ICD-10-CM | POA: Diagnosis not present

## 2018-08-21 DIAGNOSIS — C787 Secondary malignant neoplasm of liver and intrahepatic bile duct: Secondary | ICD-10-CM | POA: Diagnosis not present

## 2018-08-21 DIAGNOSIS — R110 Nausea: Secondary | ICD-10-CM | POA: Diagnosis not present

## 2018-08-21 DIAGNOSIS — R627 Adult failure to thrive: Secondary | ICD-10-CM | POA: Diagnosis not present

## 2018-08-21 MED ORDER — HEPARIN SOD (PORK) LOCK FLUSH 100 UNIT/ML IV SOLN
500.0000 [IU] | Freq: Once | INTRAVENOUS | Status: AC | PRN
  Administered 2018-08-21: 500 [IU]
  Filled 2018-08-21: qty 5

## 2018-08-21 MED ORDER — DEXAMETHASONE 2 MG PO TABS: 2.0000 mg | ORAL_TABLET | Freq: Every day | ORAL | 0 refills | Status: DC

## 2018-08-21 MED ORDER — SODIUM CHLORIDE 0.9% FLUSH
10.0000 mL | INTRAVENOUS | Status: DC | PRN
  Administered 2018-08-21: 10 mL
  Filled 2018-08-21: qty 10

## 2018-08-21 MED FILL — DEXAMETHASONE 2 MG TABLET: 2 | 10 days supply | Qty: 10 | Fill #0

## 2018-08-21 MED FILL — MAGIC MW (NYS,BEN,MAAL): 12 days supply | Qty: 240 | Fill #2

## 2018-08-21 NOTE — Progress Notes (Signed)
62 year old male diagnosed with stage IV colon cancer.  Patient has a right cerebellar metastases.  Past medical history includes CAD status post MI in 2014, hypertension, hyperlipidemia, diabetes.  Medications include Lipitor, coenzyme Q 10, Decadron, Lomotil, Marinol, Humalog, Ativan, Reglan, Remeron, Prilosec, Zofran, Compazine, and Phenergan.  Labs include sodium 133, glucose 343, and albumin 2.9 on August 27.  Height: 6 feet 2 inches. Weight: 175.1 pounds Usual body weight: 208 pounds in June 2019. BMI: 22.47.  Patient denies nausea and vomiting. Nutrition focused physical exam deferred. He reports increased appetite and increased oral intake. He prefers drinking Premier protein providing 160 cal and 30 g protein.  He will drink 2-3 bottles daily. He is eating a variety of foods. Patient's wife wants to give him oral vitamin C and oral vitamin B12. She also is concerned that his platelets have not recovered.  Nutrition diagnosis: Food and nutrition related knowledge deficit related to metastatic colon cancer as evidenced by no prior need for nutrition related information.  Intervention: Patient educated to continue small amounts of food with high calories and high-protein in 6 meals and snacks. Educated patient to drink Premier protein between meals. Provided fact sheets on increasing calories and protein and soft protein foods. Provided samples of Un[place] powder and drink. Reviewed food sources of vitamin B12 and vitamin C. Questions were answered.  Teach back method used.  Contact information given.  Monitoring, evaluation, goals: Patient will tolerate adequate calories and protein for weight maintenance/weight gain.  Next visit: Tuesday, September 10 during infusion.  **Disclaimer: This note was dictated with voice recognition software. Similar sounding words can inadvertently be transcribed and this note may contain transcription errors which may not have been corrected  upon publication of note.**

## 2018-08-22 ENCOUNTER — Telehealth: Payer: Self-pay | Admitting: Nurse Practitioner

## 2018-08-22 DIAGNOSIS — I10 Essential (primary) hypertension: Secondary | ICD-10-CM | POA: Diagnosis not present

## 2018-08-22 DIAGNOSIS — E119 Type 2 diabetes mellitus without complications: Secondary | ICD-10-CM | POA: Diagnosis not present

## 2018-08-22 DIAGNOSIS — I2510 Atherosclerotic heart disease of native coronary artery without angina pectoris: Secondary | ICD-10-CM | POA: Diagnosis not present

## 2018-08-22 DIAGNOSIS — C7951 Secondary malignant neoplasm of bone: Secondary | ICD-10-CM | POA: Diagnosis not present

## 2018-08-22 DIAGNOSIS — C7931 Secondary malignant neoplasm of brain: Secondary | ICD-10-CM | POA: Diagnosis not present

## 2018-08-22 DIAGNOSIS — G893 Neoplasm related pain (acute) (chronic): Secondary | ICD-10-CM | POA: Diagnosis not present

## 2018-08-22 DIAGNOSIS — C189 Malignant neoplasm of colon, unspecified: Secondary | ICD-10-CM | POA: Diagnosis not present

## 2018-08-22 DIAGNOSIS — D649 Anemia, unspecified: Secondary | ICD-10-CM | POA: Diagnosis not present

## 2018-08-22 DIAGNOSIS — E785 Hyperlipidemia, unspecified: Secondary | ICD-10-CM | POA: Diagnosis not present

## 2018-08-22 NOTE — Telephone Encounter (Signed)
Scheduled appt per 8/27 los - pt to get an updated schedule next visit.  

## 2018-08-26 ENCOUNTER — Telehealth: Payer: Self-pay

## 2018-08-26 DIAGNOSIS — C189 Malignant neoplasm of colon, unspecified: Secondary | ICD-10-CM | POA: Diagnosis not present

## 2018-08-26 DIAGNOSIS — C7931 Secondary malignant neoplasm of brain: Secondary | ICD-10-CM | POA: Diagnosis not present

## 2018-08-26 DIAGNOSIS — E119 Type 2 diabetes mellitus without complications: Secondary | ICD-10-CM | POA: Diagnosis not present

## 2018-08-26 DIAGNOSIS — D649 Anemia, unspecified: Secondary | ICD-10-CM | POA: Diagnosis not present

## 2018-08-26 DIAGNOSIS — E785 Hyperlipidemia, unspecified: Secondary | ICD-10-CM | POA: Diagnosis not present

## 2018-08-26 DIAGNOSIS — I10 Essential (primary) hypertension: Secondary | ICD-10-CM | POA: Diagnosis not present

## 2018-08-26 DIAGNOSIS — C7951 Secondary malignant neoplasm of bone: Secondary | ICD-10-CM | POA: Diagnosis not present

## 2018-08-26 DIAGNOSIS — G893 Neoplasm related pain (acute) (chronic): Secondary | ICD-10-CM | POA: Diagnosis not present

## 2018-08-26 DIAGNOSIS — I2510 Atherosclerotic heart disease of native coronary artery without angina pectoris: Secondary | ICD-10-CM | POA: Diagnosis not present

## 2018-08-26 DIAGNOSIS — C787 Secondary malignant neoplasm of liver and intrahepatic bile duct: Principal | ICD-10-CM

## 2018-08-26 NOTE — Telephone Encounter (Signed)
Received call from Blossom Hoops with [facility] reports that pt "didn't feel well today". Also reports tachycardia with "heart rate at 114" and a weight of 180.8. Benjamine Mola denied that the pt is having any issues with SOB or chest pain. Per [name], pt to continue to monitor heart rate and weight gain. This RN relayed information to [place] and pt wife. Wife expressed concerns regarding "pallor in fingernails". Per MD, ok to bring pt in to [facility] tomorrow. Pt wife voiced understanding.

## 2018-08-27 ENCOUNTER — Other Ambulatory Visit: Payer: 59

## 2018-08-27 ENCOUNTER — Other Ambulatory Visit: Payer: Self-pay

## 2018-08-27 ENCOUNTER — Ambulatory Visit (HOSPITAL_COMMUNITY)
Admission: RE | Admit: 2018-08-27 | Discharge: 2018-08-27 | Disposition: A | Discharge status: Home / Self Care | Payer: 59 | Source: Ambulatory Visit | Attending: Medical | Admitting: Medical

## 2018-08-27 ENCOUNTER — Ambulatory Visit: Payer: 59

## 2018-08-27 ENCOUNTER — Inpatient Hospital Stay (HOSPITAL_BASED_OUTPATIENT_CLINIC_OR_DEPARTMENT_OTHER): Payer: 59 | Admitting: Medical

## 2018-08-27 ENCOUNTER — Telehealth: Payer: Self-pay | Admitting: Interventional Cardiology

## 2018-08-27 ENCOUNTER — Encounter: Payer: 59 | Admitting: Nutrition

## 2018-08-27 ENCOUNTER — Inpatient Hospital Stay: Payer: 59 | Attending: Oncology | Admitting: Medical

## 2018-08-27 VITALS — BP 113/68 | HR 120 | Temp 98.9°F | Resp 20

## 2018-08-27 DIAGNOSIS — R0602 Shortness of breath: Secondary | ICD-10-CM | POA: Insufficient documentation

## 2018-08-27 DIAGNOSIS — I10 Essential (primary) hypertension: Secondary | ICD-10-CM | POA: Insufficient documentation

## 2018-08-27 DIAGNOSIS — C7951 Secondary malignant neoplasm of bone: Secondary | ICD-10-CM | POA: Insufficient documentation

## 2018-08-27 DIAGNOSIS — C801 Malignant (primary) neoplasm, unspecified: Secondary | ICD-10-CM | POA: Insufficient documentation

## 2018-08-27 DIAGNOSIS — R0609 Other forms of dyspnea: Secondary | ICD-10-CM

## 2018-08-27 DIAGNOSIS — R0600 Dyspnea, unspecified: Secondary | ICD-10-CM

## 2018-08-27 DIAGNOSIS — J9601 Acute respiratory failure with hypoxia: Secondary | ICD-10-CM | POA: Diagnosis not present

## 2018-08-27 DIAGNOSIS — J9621 Acute and chronic respiratory failure with hypoxia: Secondary | ICD-10-CM | POA: Diagnosis not present

## 2018-08-27 DIAGNOSIS — R918 Other nonspecific abnormal finding of lung field: Secondary | ICD-10-CM

## 2018-08-27 DIAGNOSIS — C787 Secondary malignant neoplasm of liver and intrahepatic bile duct: Secondary | ICD-10-CM

## 2018-08-27 DIAGNOSIS — R590 Localized enlarged lymph nodes: Secondary | ICD-10-CM

## 2018-08-27 DIAGNOSIS — C189 Malignant neoplasm of colon, unspecified: Secondary | ICD-10-CM

## 2018-08-27 DIAGNOSIS — C7800 Secondary malignant neoplasm of unspecified lung: Secondary | ICD-10-CM | POA: Diagnosis not present

## 2018-08-27 DIAGNOSIS — R0689 Other abnormalities of breathing: Secondary | ICD-10-CM

## 2018-08-27 DIAGNOSIS — R609 Edema, unspecified: Secondary | ICD-10-CM

## 2018-08-27 DIAGNOSIS — R000 Tachycardia, unspecified: Secondary | ICD-10-CM | POA: Diagnosis not present

## 2018-08-27 DIAGNOSIS — J189 Pneumonia, unspecified organism: Secondary | ICD-10-CM | POA: Diagnosis not present

## 2018-08-27 DIAGNOSIS — E43 Unspecified severe protein-calorie malnutrition: Secondary | ICD-10-CM | POA: Diagnosis not present

## 2018-08-27 DIAGNOSIS — A419 Sepsis, unspecified organism: Secondary | ICD-10-CM | POA: Diagnosis not present

## 2018-08-27 DIAGNOSIS — C7931 Secondary malignant neoplasm of brain: Secondary | ICD-10-CM

## 2018-08-27 DIAGNOSIS — J9811 Atelectasis: Secondary | ICD-10-CM | POA: Diagnosis not present

## 2018-08-27 LAB — CBC WITH DIFFERENTIAL (CANCER CENTER ONLY)
Basophils Absolute: 0 10*3/uL (ref 0.0–0.1)
Basophils Relative: 0 %
EOS ABS: 0 10*3/uL (ref 0.0–0.5)
EOS PCT: 0 %
HCT: 30.6 % — ABNORMAL LOW (ref 38.4–49.9)
Hemoglobin: 10.1 g/dL — ABNORMAL LOW (ref 13.0–17.1)
LYMPHS ABS: 0.1 10*3/uL — AB (ref 0.9–3.3)
Lymphocytes Relative: 3 %
MCH: 30.3 pg (ref 27.2–33.4)
MCHC: 33 g/dL (ref 32.0–36.0)
MCV: 91.9 fL (ref 79.3–98.0)
Monocytes Absolute: 0.2 10*3/uL (ref 0.1–0.9)
Monocytes Relative: 7 %
NEUTROS ABS: 2.5 10*3/uL (ref 1.5–6.5)
NEUTROS PCT: 90 %
NRBC: 6 /100{WBCs} — AB
PLATELETS: 63 10*3/uL — AB (ref 140–400)
RBC: 3.33 MIL/uL — ABNORMAL LOW (ref 4.20–5.82)
RDW: 18.3 % — ABNORMAL HIGH (ref 11.0–14.6)
WBC Count: 2.8 10*3/uL — ABNORMAL LOW (ref 4.0–10.3)

## 2018-08-27 LAB — CMP (CANCER CENTER ONLY)
ALBUMIN: 2.3 g/dL — AB (ref 3.5–5.0)
ALT: 46 U/L — ABNORMAL HIGH (ref 0–44)
AST: 24 U/L (ref 15–41)
Alkaline Phosphatase: 367 U/L — ABNORMAL HIGH (ref 38–126)
Anion gap: 10 (ref 5–15)
BUN: 17 mg/dL (ref 8–23)
CHLORIDE: 98 mmol/L (ref 98–111)
CO2: 22 mmol/L (ref 22–32)
Calcium: 7.8 mg/dL — ABNORMAL LOW (ref 8.9–10.3)
Creatinine: 0.52 mg/dL — ABNORMAL LOW (ref 0.61–1.24)
GFR, Est AFR Am: >60 mL/min (ref >60)
GFR, Estimated: >60 mL/min (ref >60)
GLUCOSE: 259 mg/dL — AB (ref 70–99)
Potassium: 4 mmol/L (ref 3.5–5.1)
SODIUM: 130 mmol/L — AB (ref 135–145)
Total Bilirubin: 0.5 mg/dL (ref 0.3–1.2)
Total Protein: 5.1 g/dL — ABNORMAL LOW (ref 6.5–8.1)

## 2018-08-27 LAB — SAMPLE TO BLOOD BANK

## 2018-08-27 LAB — BRAIN NATRIURETIC PEPTIDE: B NATRIURETIC PEPTIDE 5: 14.5 pg/mL (ref 0.0–100.0)

## 2018-08-27 MED ORDER — HEPARIN SOD (PORK) LOCK FLUSH 100 UNIT/ML IV SOLN
INTRAVENOUS | Status: AC
  Filled 2018-08-27: qty 5

## 2018-08-27 MED ORDER — IOPAMIDOL (ISOVUE-370) INJECTION 76%
INTRAVENOUS | Status: AC
  Filled 2018-08-27: qty 100

## 2018-08-27 MED ORDER — IOPAMIDOL (ISOVUE-370) INJECTION 76%
100.0000 mL | Freq: Once | INTRAVENOUS | Status: AC | PRN
  Administered 2018-08-27: 100 mL via INTRAVENOUS

## 2018-08-27 MED ORDER — HEPARIN SOD (PORK) LOCK FLUSH 100 UNIT/ML IV SOLN: 500.0000 [IU] | Freq: Once | INTRAVENOUS | Status: DC

## 2018-08-27 NOTE — Telephone Encounter (Signed)
Spoke with patient's wife, per dpr, her husband has been SOB. His heart rate today was 120-130s. He had his chemotherapy appointment yesterday. The wife was concerned because he gained 10lbs of weight in a week. He has been on decadron since June 1st. The most recent medication change was mirtazapine 7.5 mg for appetite. He has been more fatigued than usual. Patient has swelling around his feet and it is relieved been elevation.   Spoke with Dr. Burt Knack (DOD), he stated since the patient has many factors contributing to his overall symptoms that oncology would be the specialty to address the issue. Oncology has been closely monitoring him with his chemotherapy. Spoke with patient's wife, she expressed understanding. Advised her if symptoms worsen to give [name] a call back.

## 2018-08-27 NOTE — Progress Notes (Signed)
Pt presents with gen fatigue, weakness, and tachycardia x2 days.  Afebrile but warm to touch.  Denies chills.  Some nausea w/out vomiting. Per wife pt has been lethargic and has not been eating or drinking much.  A&Ox4.  Cannot get up from wheelchair/ chairside d/t weakness that has increased.  One set of blood cultures drawn from peripheral site but second set on hold per verbal order from [place].  EKG taken by [facility] NT and given to [place].    Symptoms Management Clinic Progress Note   Allen Glenn [id number] 13-Jan-1956 62 y.o.  Festus Holts Schrum is managed by Dr. Dominica Severin B. Sherrill  Actively treated with chemotherapy/immunotherapy: yes  Current Therapy: FOLFIRI   Last Treated: 08/19/2018 (cycle 1, day 1)  Assessment: Plan:    Metastatic colon cancer to liver ([place]) - Plan: Draw extra clot specimen, Culture, Blood, Culture, Blood  Tachycardia - Plan: EKG 12-Lead, BNP (Brain natriuretic peptide), DG Chest 2 View  Edema, unspecified type - Plan: BNP (Brain natriuretic peptide)  Decreased breath sounds - Plan: DG Chest 2 View  Shortness of breath - Plan: CT ANGIO CHEST PE W OR WO CONTRAST  DOE (dyspnea on exertion) - Plan: CT ANGIO CHEST PE W OR WO CONTRAST   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 Dominica Severin B. Sherrill and is status post cycle 1, day 1 of FOLFIRI which was dosed on 08/19/2018.  The patient is scheduled to follow-up with [name] on 09/02/2018.  Tachycardia: The patient's vital signs returned showing a pulse of 133 with oxygen saturation at 97% on room air.  A CBC, chemistry panel, and EKG were completed today with the EKG returning showing sinus tachycardia at 126 bpm with left axis deviation.  No ischemic changes were noted.  Dr. Dominica Severin B. Sherrill had recommended that the patient receive a bolus of 500 mL's of normal saline.  Initially the patient and his family elected to proceed with  this but then changed their mind as they were concerned regarding his weight increase.  Bilateral lower extremity edema with weight increase.  The patient's family states that the patient has had a 10 pound weight increase over the last 7 days however he was unable to be weighed today.  His weight did increase by 7 pounds from 08/01/2018 to 08/19/2018.  A BNP was ordered today with results returning within normal limits at 14.5.  Decreased breath sounds bilaterally: A chest x-ray was completed which showed mild atelectasis of the right lower lobe and right middle lobe with post radiation changes noted involving the inferior left upper lobe.  No acute findings were noted.  Shortness of breath and dyspnea on exertion with tachycardia: The patient was referred for a CT angiogram which returned showing no evidence of a PE.  The patient's wife states that she is concerned that he could worsen when he returns home.  They were reassured regarding the patient's overall to stable work-up from today.  She was reassured and told that the patient should be taken to the ER or return to see [name] if his condition worsens.  They were reassured that his shortness of breath and dyspnea on exertion with tachycardia could be multifactorial in etiology.  This could include deconditioning, dehydration, his cancer diagnosis, comorbidities and the mild drop in his hemoglobin from 12.5 to 10.1.  Please see After Visit Summary for patient specific instructions.  Future  Appointments  Date Time Provider [place]  09/02/2018  7:45 AM [facility] LAB 5 [facility] None  09/02/2018  8:00 AM [facility] [place] [facility] None  09/02/2018  8:30 AM Ladell Pier, MD [facility] None  09/02/2018  9:00 AM [facility] INFUSION [facility] None  09/02/2018  9:00 AM Jennet Maduro, RD [facility] None  09/04/2018  1:00 PM [facility] [place] FLUSH [facility] None  09/05/2018  9:50 AM GI-315 MR 3 GI-315MRI GI-[address]  09/09/2018   2:30 PM Hayden Pedro, PA-C [facility] None  09/16/2018 11:30 AM [facility] LAB 2 [facility] None  09/16/2018 11:45 AM [facility] [place] FLUSH [facility] None  09/16/2018 12:15 PM Owens Shark, NP [facility] None  09/16/2018  1:00 PM [facility] INFUSION [facility] None  09/18/2018  1:30 PM [facility] [place] FLUSH [facility] None  09/23/2018  8:40 AM [name], MD [facility]    Orders Placed This Encounter  Procedures  . Culture, Blood  . Culture, Blood  . DG Chest 2 View  . CT ANGIO CHEST PE W OR WO CONTRAST  . Draw extra clot specimen  . BNP (Brain natriuretic peptide)  . EKG 12-Lead  . Sample to Blood Bank       Subjective:   Patient ID:  Allen Glenn is a 62 y.o. (DOB 01-02-1956) male.  Chief Complaint:  Chief Complaint  Patient presents with  . Tachycardia    HPI Allen Glenn is a 62 year old male with a diagnosis of a metastatic colon cancer with liver, bone, lung, and brain metastasis who is managed by Dr. Dominica Severin B. Sherrill.  The patient is status post cycle 1 of FOLFIRI which was dosed on 08/19/2018.  He presents with his wife and daughters today.  They report that he has had a 10 pound weight gain over the last 7 days although he did not have his weight checked today.  He was tapered down from his Decadron to 2 mg p.o. twice daily from 4 mg p.o. twice daily last Tuesday.  His wife has tapered him down to 2 mg once daily as of yesterday.  He was being seen by physical therapy yesterday when he was noted to be tachycardic at between 120 and 130 bpm.  The physical therapist told the patient that he had decreased breath sounds in the lung bases bilaterally.  He is having increased fatigue and weakness.  He is afebrile but feels warm.  He is having dyspnea on exertion, shortness of breath, and lower extremity edema.  He has a sore area over his sacrum but has no skin breakdown.  The patient's wife is concerned that his fingers appear pale.  He denies dizziness  or falls.  He has had no nausea, vomiting, diarrhea, or chest pain.  The patient's wife contacted the cardiologist.  His cardiologist Dr. Tamala Julian was off today.  Dr. Burt Knack was covering and advised the patient to be evaluated by Dr. Dominica Severin B. Sherrill.   Medications: I have reviewed the patient's current medications.  Allergies: No Known Allergies  Past Medical History:  Diagnosis Date  . Acute MI ([place]) 2014   acute ST elevation MI  . Anemia   . Colon cancer ([place]) 2016   Status post resection of colon mass is well as he panic metastasis.   . Coronary artery disease   . Diabetes mellitus without complication ([place])   . Enlarged prostate    slightly and takes Flomax daily  . Heart disease   . History of blood transfusion   .  Hyperlipidemia    takes Lipitor daily  . Hypertension    takes Lisinopril daily  . Lung nodule    left  . Nocturia   . Numbness    left foot  . Pancreatitis   . Peripheral neuropathy     Past Surgical History:  Procedure Laterality Date  . 1/8 of liver removed    . APPENDECTOMY    . APPLICATION OF CRANIAL NAVIGATION Left 06/04/2018   Procedure: APPLICATION OF CRANIAL NAVIGATION;  Surgeon: Erline Levine, MD;  Location: [place];  Service: Neurosurgery;  Laterality: Left;  . BIOPSY  05/22/2018   Procedure: BIOPSY;  Surgeon: Carol Ada, MD;  Location: [facility];  Service: Endoscopy;;  . BRAIN SURGERY  06/04/2018  . COLONOSCOPY    . CORONARY ANGIOPLASTY     1 stent  . CORONARY STENT PLACEMENT  06-27-2013  . CRANIOTOMY Left 06/04/2018   Procedure: CRANIOTOMY TUMOR EXCISION with Brainlab;  Surgeon: Erline Levine, MD;  Location: [place];  Service: Neurosurgery;  Laterality: Left;  CRANIOTOMY TUMOR EXCISION with Brainlab  . ESOPHAGOGASTRODUODENOSCOPY Left 05/22/2018   Procedure: ESOPHAGOGASTRODUODENOSCOPY (EGD);  Surgeon: Carol Ada, MD;  Location: [facility];  Service: Endoscopy;  Laterality: Left;  . HERNIA REPAIR Left 1991  . IR IMAGING GUIDED PORT  INSERTION  07/18/2018  . LAPAROSCOPIC RIGHT COLECTOMY  2016   [place]  . LIVER LOBECTOMY Right 09/28/2015    Right partial hepate[facility] at [facility]  . LUNG BIOPSY Left 11/28/2016   Procedure: LUNG BIOPSY, left upper lobe;  Surgeon: [name], MD;  Location: [place];  Service: Thoracic;  Laterality: Left;  Marland Kitchen VIDEO BRONCHOSCOPY WITH ENDOBRONCHIAL NAVIGATION N/A 11/28/2016   Procedure: VIDEO BRONCHOSCOPY WITH ENDOBRONCHIAL NAVIGATION;  Surgeon: [name], MD;  Location: [facility] OR;  Service: Thoracic;  Laterality: N/A;  . VIDEO BRONCHOSCOPY WITH ENDOBRONCHIAL ULTRASOUND N/A 11/28/2016   Procedure: VIDEO BRONCHOSCOPY WITH ENDOBRONCHIAL ULTRASOUND;  Surgeon: [name], MD;  Location: [facility];  Service: Thoracic;  Laterality: N/A;    Family History  Problem Relation Age of Onset  . Hypertension Mother   . Heart disease Mother   . Hypertension Father   . Heart disease Father   . Neuropathy Neg Hx     Social History   Socioeconomic History  . Marital status: Married    Spouse name: [name]  . Number of children: 2  . Years of education: Bachelor  . Highest education level: Not on file  Occupational History  . Occupation: Company secretary  Social Needs  . Financial resource strain: Not on file  . Food insecurity:    Worry: Not on file    Inability: Not on file  . Transportation needs:    Medical: Not on file    Non-medical: Not on file  Tobacco Use  . Smoking status: Never Smoker  . Smokeless tobacco: Never Used  Substance and Sexual Activity  . Alcohol use: No    Alcohol/week: 0.0 standard drinks  . Drug use: No  . Sexual activity: Not on file  Lifestyle  . Physical activity:    Days per week: Not on file    Minutes per session: Not on file  . Stress: Not on file  Relationships  . Social connections:    Talks on phone: Not on file    Gets together: Not on file    Attends religious service: Not on file    Active member of club or organization: Not on file    Attends  meetings of  clubs or organizations: Not on file    Relationship status: Not on file  . Intimate partner violence:    Fear of current or ex partner: Not on file    Emotionally abused: Not on file    Physically abused: Not on file    Forced sexual activity: Not on file  Other Topics Concern  . Not on file  Social History Narrative   Live at home with wife, [name]   Patient is a [employer] manager, active and goes to gym   Caffeine: very little soda, occasional tea. 12oz-16oz/per    Past Medical History, Surgical history, Social history, and Family history were reviewed and updated as appropriate.   Please see review of systems for further details on the patient's review from today.   Review of Systems:  Review of Systems  Constitutional: Positive for fatigue. Negative for appetite change, chills, diaphoresis and fever.  HENT: Negative for dental problem, mouth sores and trouble swallowing.   Respiratory: Positive for shortness of breath. Negative for cough and chest tightness.        Dyspnea on exertion  Cardiovascular: Positive for leg swelling. Negative for chest pain and palpitations.  Gastrointestinal: Negative for constipation, diarrhea, nausea and vomiting.  Skin: Negative for rash and wound.  Neurological: Positive for weakness. Negative for dizziness, syncope and headaches.    Objective:   Physical Exam:  BP 113/68 (BP Location: Right Arm, Patient Position: Sitting)   Pulse (!) 120   Temp 98.9 F (37.2 C) (Oral)   Resp 20   SpO2 99%  ECOG: 2  Physical Exam  Constitutional: No distress.  The patient is a chronically ill-appearing adult male who is ambulating in a wheelchair and who appears to be markedly fatigued.  HENT:  Head: Atraumatic.  Mouth/Throat: No oropharyngeal exudate.  Cushingoid face  Neck: Neck supple. No JVD present.  Cardiovascular: S1 normal and S2 normal. Tachycardia present.  Pulmonary/Chest: Effort normal. No stridor. No respiratory distress. He  has no wheezes. He has no rales.  Decreased bibasilar lung sounds.  Abdominal: Soft. Bowel sounds are normal. There is no tenderness. There is no guarding.  Abdomen is protuberant.  Musculoskeletal: He exhibits edema.  Trace to 1+ pitting edema to the knees bilaterally. Negligible presacral edema.  Lymphadenopathy:    He has no cervical adenopathy.  Neurological: Coordination (Mr. Cocuzza is ambulating with the use of a wheelchair.) abnormal.  Skin: Skin is warm and dry. He is not diaphoretic.    Lab Review:     Component Value Date/Time   NA 130 (L) 08/27/2018 0931   NA 137 11/19/2017 0822   K 4.0 08/27/2018 0931   K 4.2 11/19/2017 0822   CL 98 08/27/2018 0931   CO2 22 08/27/2018 0931   CO2 24 11/19/2017 0822   GLUCOSE 259 (H) 08/27/2018 0931   GLUCOSE 189 (H) 11/19/2017 0822   BUN 17 08/27/2018 0931   BUN 13.0 11/19/2017 0822   CREATININE 0.52 (L) 08/27/2018 0931   CREATININE 0.8 11/19/2017 0822   CALCIUM 7.8 (L) 08/27/2018 0931   CALCIUM 8.9 11/19/2017 0822   PROT 5.1 (L) 08/27/2018 0931   PROT 7.1 11/19/2017 0822   ALBUMIN 2.3 (L) 08/27/2018 0931   ALBUMIN 4.1 11/19/2017 0822   AST 24 08/27/2018 0931   AST 34 11/19/2017 0822   ALT 46 (H) 08/27/2018 0931   ALT 58 (H) 11/19/2017 0822   ALKPHOS 367 (H) 08/27/2018 0931   ALKPHOS 94 11/19/2017 0822   BILITOT 0.5 08/27/2018  0931   BILITOT 0.42 11/19/2017 0822   GFRNONAA >60 08/27/2018 0931   GFRAA >60 08/27/2018 0931       Component Value Date/Time   WBC 2.8 (L) 08/27/2018 0931   WBC 4.4 07/18/2018 0535   RBC 3.33 (L) 08/27/2018 0931   HGB 10.1 (L) 08/27/2018 0931   HCT 30.6 (L) 08/27/2018 0931   PLT 63 (L) 08/27/2018 0931   MCV 91.9 08/27/2018 0931   MCH 30.3 08/27/2018 0931   MCHC 33.0 08/27/2018 0931   RDW 18.3 (H) 08/27/2018 0931   LYMPHSABS 0.1 (L) 08/27/2018 0931   MONOABS 0.2 08/27/2018 0931   EOSABS 0.0 08/27/2018 0931   BASOSABS 0.0 08/27/2018 0931   -------------------------------  Imaging from  last 24 hours (if applicable):  Radiology interpretation: Dg Chest 2 View  Result Date: 08/27/2018 CLINICAL DATA:  62 year old with current history of metastatic colon cancer for which the patient is undergoing chemotherapy, presenting with two-day history of fatigue, generalized weakness and tachycardia. EXAM: CHEST - 2 VIEW COMPARISON:  Chest x-rays 07/14/2018, 12/06/2016 and earlier. CT chest 02/25/2018, 11/26/2017 and earlier. MRI thoracic spine 07/04/2018 is correlated. FINDINGS: Cardiac silhouette normal in size, unchanged. Thoracic aorta mildly atherosclerotic, unchanged. Hilar and mediastinal contours otherwise unremarkable. RIGHT jugular Port-A-Cath tip in the UPPER RIGHT atrium. Evolving post radiation changes involving the INFERIOR LEFT UPPER LOBE which now has the appearance of linear scarring. Atelectasis involving the RIGHT LOWER LOBE and RIGHT MIDDLE LOBE, with associated mild elevation of the RIGHT hemidiaphragm. No confluent airspace consolidation. Metastasis involving the RIGHT ANTERIOR fourth rib with a mixed lytic and sclerotic appearance, overlying the RIGHT mid lung and RIGHT lung base on the PA image. Mixed lytic and sclerotic metastases involving the mid and UPPER thoracic spine as noted on the recent MRI. IMPRESSION: 1. Mild atelectasis involving the RIGHT LOWER LOBE and RIGHT MIDDLE LOBE. No acute cardiopulmonary disease otherwise. 2. Involving post radiation changes involving the INFERIOR LEFT UPPER LOBE which now has the appearance of linear scarring. 3. Osseous metastatic disease involving the RIGHT ANTERIOR fourth rib and multiple mid and UPPER thoracic vertebrae. Electronically Signed   By: Evangeline Dakin M.D.   On: 08/27/2018 13:10   Ct Angio Chest Pe W Or Wo Contrast  Result Date: 08/27/2018 CLINICAL DATA:  62 year old male with metastatic colon cancer. Shortness of breath for 2 days. EXAM: CT ANGIOGRAPHY CHEST WITH CONTRAST TECHNIQUE: Multidetector CT imaging of the chest  was performed using the standard protocol during bolus administration of intravenous contrast. Multiplanar CT image reconstructions and MIPs were obtained to evaluate the vascular anatomy. CONTRAST:  153mL ISOVUE-370 IOPAMIDOL (ISOVUE-370) INJECTION 76% COMPARISON:  Restaging CT chest abdomen and pelvis 02/25/2018 and earlier. Thoracic and lumbar spine MRI 07/04/2018. FINDINGS: Cardiovascular: Good contrast bolus timing in the pulmonary arterial tree. Respiratory motion in the lower lobes. No focal filling defect identified in the pulmonary arteries to suggest acute pulmonary embolism. Calcified aortic atherosclerosis. Calcified coronary artery atherosclerosis and/or stent. Right chest porta cath is new since March. No cardiomegaly or pericardial effusion. Mediastinum/Nodes: No mediastinal lymphadenopathy, but there is nonspecific increasing left hilar soft tissue as seen on series 5, image 147, measuring up to 11 millimeters in thickness. The right hilum appears stable and negative. Negative thoracic inlet. Lungs/Pleura: Lower lung volumes.  The major airways are patent. Increased size and number of numerous pulmonary nodules throughout both lungs. Nodules range from punctate to 8-9 millimeters diameter. Superimposed right greater than left costophrenic angle atelectasis. Trace right pleural effusion. Upper  Abdomen: Hyperenhancing liver metastases demonstrated on 02/25/2018 are less apparent today, but likely related to contrast timing as with narrow liver windows a large anterior right liver mass now measures at least 6 centimeters diameter (approximately 3 centimeters previously). Negative visible spleen, pancreas, adrenal glands, kidneys and bowel in the upper abdomen. Musculoskeletal: New sclerotic thoracic vertebral lesions at T1 and T4 through T6 levels are compatible with osseous metastatic disease. Confluent vertebral body involvement T4 through T6 is associated with mild pathologic fracture of T5 and bulky  right anterolateral extraosseous extension of tumor as demonstrated on series 5, image 97. No obvious epidural tumor or evidence of cord compression at that level. These levels appears similar to the 07/04/2018 MRI. Review of the MIP images confirms the above findings. IMPRESSION: 1.  Negative for acute pulmonary embolus. 2. Progression of metastatic disease since the CTs on 02/25/2018. Numerous bilateral pulmonary nodules, new left hilar lymphadenopathy, enlarged liver tumor, and also multifocal osseous metastatic disease (thoracic spine metastases appear similar to the MRI on 07/04/2018). Electronically Signed   By: Genevie Ann M.D.   On: 08/27/2018 16:15        This patient was seen with [name] with my treatment plan reviewed with him. He expressed agreement with my medical management of this patient.  This was a shared visit with Sandi Mealy.  Mr. Zea was interviewed and examined.  He is now at day 9 following cycle 1 FOLFIRI.  He presents with malaise and tachycardia.  There is no apparent explanation for the tachycardia.  This is likely multifactorial.  I reviewed the CT chest images.  He has multiple small pulmonary nodules, unlikely to account for dyspnea or tachycardia.  Mild lower extremity edema is likely related to Decadron and hypoalbuminemia.  He will return for an office visit as scheduled on 09/02/2018.    Julieanne Manson, MD

## 2018-08-27 NOTE — Patient Instructions (Signed)
Sinus Tachycardia °Sinus tachycardia is a kind of fast heartbeat. In sinus tachycardia, the heart beats more than 100 times a minute. Sinus tachycardia starts in a part of the heart called the sinus node. Sinus tachycardia may be harmless, or it may be a sign of a serious condition. °What are the causes? °This condition may be caused by: °· Exercise or exertion. °· A fever. °· Pain. °· Loss of body fluids (dehydration). °· Severe bleeding (hemorrhage). °· Anxiety and stress. °· Certain substances, including: °? Alcohol. °? Caffeine. °? Tobacco and nicotine products. °? Diet pills. °? Illegal drugs. °· Medical conditions including: °? Heart disease. °? An infection. °? An overactive thyroid (hyperthyroidism). °? A lack of red blood cells (anemia). ° °What are the signs or symptoms? °Symptoms of this condition include: °· A feeling that the heart is beating quickly (palpitations). °· Suddenly noticing your heartbeat (cardiac awareness). °· Dizziness. °· Tiredness (fatigue). °· Shortness of breath. °· Chest pain. °· Nausea. °· Fainting. ° °How is this diagnosed? °This condition is diagnosed with: °· A physical exam. °· Other tests, such as: °? Blood tests. °? An electrocardiogram (ECG). This test measures the electrical activity of the heart. °? Holter monitoring. For this test, you wear a device that records your heartbeat for one or more days. ° °You may be referred to a heart specialist (cardiologist). °How is this treated? °Treatment for this condition depends on the cause or underlying condition. Treatment may involve: °· Treating the underlying condition. °· Taking new medicines or changing your current medicines as told by your health care provider. °· Making changes to your diet or lifestyle. °· Practicing relaxation methods. ° °Follow these instructions at home: °Lifestyle °· Do not use any products that contain nicotine or tobacco, such as cigarettes and e-cigarettes. If you need help quitting, ask your  health care provider. °· Learn relaxation methods, like deep breathing, to help you when you get stressed or anxious. °· Do not use illegal drugs, such as cocaine. °· Do not abuse alcohol. Limit alcohol intake to no more than 1 drink a day for non-pregnant women and 2 drinks a day for men. One drink equals 12 oz of beer, 5 oz of wine, or 1½ oz of hard liquor. °· Find time to rest and relax often. This reduces stress. °· Avoid: °? Caffeine. °? Stimulants such as over-the-counter diet pills or pills that help you to stay awake. °? Situations that cause anxiety or stress. °General instructions °· Drink enough fluids to keep your urine clear or pale yellow. °· Take over-the-counter and prescription medicines only as told by your health care provider. °· Keep all follow-up visits as told by your health care provider. This is important. °Contact a health care provider if: °· You have a fever. °· You have vomiting or diarrhea that keeps happening (is persistent). °Get help right away if: °· You have pain in your chest, upper arms, jaw, or neck. °· You become weak or dizzy. °· You feel faint. °· You have palpitations that do not go away. °This information is not intended to replace advice given to you by your health care provider. Make sure you discuss any questions you have with your health care provider. °Document Released: 01/17/2005 Document Revised: 07/07/2016 Document Reviewed: 06/24/2015 °Elsevier Interactive Patient Education © 2018 [employer] ° °

## 2018-08-27 NOTE — Telephone Encounter (Signed)
New Message:    Patient wife states Allen Glenn heart rate has been up and is weak this morning.   Gained 10lbs within a week.

## 2018-08-28 ENCOUNTER — Inpatient Hospital Stay (HOSPITAL_COMMUNITY)
Admission: EM | Admit: 2018-08-28 | Discharge: 2018-09-22 | DRG: 871 | Disposition: A | Discharge status: Hospice | Payer: 59 | Attending: Internal Medicine | Admitting: Internal Medicine

## 2018-08-28 ENCOUNTER — Other Ambulatory Visit: Payer: Self-pay

## 2018-08-28 ENCOUNTER — Encounter (HOSPITAL_COMMUNITY): Payer: Self-pay | Admitting: *Deleted

## 2018-08-28 DIAGNOSIS — D6959 Other secondary thrombocytopenia: Secondary | ICD-10-CM | POA: Diagnosis present

## 2018-08-28 DIAGNOSIS — Z9889 Other specified postprocedural states: Secondary | ICD-10-CM | POA: Diagnosis not present

## 2018-08-28 DIAGNOSIS — Z66 Do not resuscitate: Secondary | ICD-10-CM | POA: Diagnosis not present

## 2018-08-28 DIAGNOSIS — D709 Neutropenia, unspecified: Secondary | ICD-10-CM | POA: Diagnosis not present

## 2018-08-28 DIAGNOSIS — J189 Pneumonia, unspecified organism: Secondary | ICD-10-CM | POA: Diagnosis present

## 2018-08-28 DIAGNOSIS — I159 Secondary hypertension, unspecified: Secondary | ICD-10-CM | POA: Diagnosis not present

## 2018-08-28 DIAGNOSIS — F419 Anxiety disorder, unspecified: Secondary | ICD-10-CM | POA: Diagnosis present

## 2018-08-28 DIAGNOSIS — Z6821 Body mass index (BMI) 21.0-21.9, adult: Secondary | ICD-10-CM

## 2018-08-28 DIAGNOSIS — E875 Hyperkalemia: Secondary | ICD-10-CM | POA: Diagnosis not present

## 2018-08-28 DIAGNOSIS — R0689 Other abnormalities of breathing: Secondary | ICD-10-CM | POA: Diagnosis not present

## 2018-08-28 DIAGNOSIS — Z794 Long term (current) use of insulin: Secondary | ICD-10-CM

## 2018-08-28 DIAGNOSIS — Z79899 Other long term (current) drug therapy: Secondary | ICD-10-CM

## 2018-08-28 DIAGNOSIS — E11649 Type 2 diabetes mellitus with hypoglycemia without coma: Secondary | ICD-10-CM | POA: Diagnosis not present

## 2018-08-28 DIAGNOSIS — D638 Anemia in other chronic diseases classified elsewhere: Secondary | ICD-10-CM | POA: Diagnosis present

## 2018-08-28 DIAGNOSIS — R531 Weakness: Secondary | ICD-10-CM

## 2018-08-28 DIAGNOSIS — Z7189 Other specified counseling: Secondary | ICD-10-CM

## 2018-08-28 DIAGNOSIS — Z8249 Family history of ischemic heart disease and other diseases of the circulatory system: Secondary | ICD-10-CM

## 2018-08-28 DIAGNOSIS — X58XXXA Exposure to other specified factors, initial encounter: Secondary | ICD-10-CM | POA: Diagnosis present

## 2018-08-28 DIAGNOSIS — D701 Agranulocytosis secondary to cancer chemotherapy: Secondary | ICD-10-CM | POA: Diagnosis not present

## 2018-08-28 DIAGNOSIS — Z9049 Acquired absence of other specified parts of digestive tract: Secondary | ICD-10-CM

## 2018-08-28 DIAGNOSIS — E1165 Type 2 diabetes mellitus with hyperglycemia: Secondary | ICD-10-CM

## 2018-08-28 DIAGNOSIS — R5081 Fever presenting with conditions classified elsewhere: Secondary | ICD-10-CM | POA: Diagnosis not present

## 2018-08-28 DIAGNOSIS — R0603 Acute respiratory distress: Secondary | ICD-10-CM | POA: Diagnosis not present

## 2018-08-28 DIAGNOSIS — R110 Nausea: Secondary | ICD-10-CM | POA: Diagnosis not present

## 2018-08-28 DIAGNOSIS — J13 Pneumonia due to Streptococcus pneumoniae: Secondary | ICD-10-CM | POA: Diagnosis not present

## 2018-08-28 DIAGNOSIS — D61810 Antineoplastic chemotherapy induced pancytopenia: Secondary | ICD-10-CM | POA: Diagnosis not present

## 2018-08-28 DIAGNOSIS — Z6823 Body mass index (BMI) 23.0-23.9, adult: Secondary | ICD-10-CM

## 2018-08-28 DIAGNOSIS — Z7952 Long term (current) use of systemic steroids: Secondary | ICD-10-CM | POA: Diagnosis not present

## 2018-08-28 DIAGNOSIS — R509 Fever, unspecified: Secondary | ICD-10-CM | POA: Diagnosis not present

## 2018-08-28 DIAGNOSIS — R0602 Shortness of breath: Secondary | ICD-10-CM

## 2018-08-28 DIAGNOSIS — E871 Hypo-osmolality and hyponatremia: Secondary | ICD-10-CM | POA: Diagnosis not present

## 2018-08-28 DIAGNOSIS — D6481 Anemia due to antineoplastic chemotherapy: Secondary | ICD-10-CM | POA: Diagnosis present

## 2018-08-28 DIAGNOSIS — J9621 Acute and chronic respiratory failure with hypoxia: Secondary | ICD-10-CM | POA: Diagnosis present

## 2018-08-28 DIAGNOSIS — A419 Sepsis, unspecified organism: Principal | ICD-10-CM | POA: Diagnosis present

## 2018-08-28 DIAGNOSIS — I272 Pulmonary hypertension, unspecified: Secondary | ICD-10-CM | POA: Diagnosis present

## 2018-08-28 DIAGNOSIS — C7800 Secondary malignant neoplasm of unspecified lung: Secondary | ICD-10-CM | POA: Diagnosis present

## 2018-08-28 DIAGNOSIS — E860 Dehydration: Secondary | ICD-10-CM | POA: Diagnosis not present

## 2018-08-28 DIAGNOSIS — R0600 Dyspnea, unspecified: Secondary | ICD-10-CM | POA: Diagnosis not present

## 2018-08-28 DIAGNOSIS — R0902 Hypoxemia: Secondary | ICD-10-CM | POA: Diagnosis not present

## 2018-08-28 DIAGNOSIS — Z955 Presence of coronary angioplasty implant and graft: Secondary | ICD-10-CM

## 2018-08-28 DIAGNOSIS — Z515 Encounter for palliative care: Secondary | ICD-10-CM | POA: Diagnosis not present

## 2018-08-28 DIAGNOSIS — T380X5A Adverse effect of glucocorticoids and synthetic analogues, initial encounter: Secondary | ICD-10-CM | POA: Diagnosis not present

## 2018-08-28 DIAGNOSIS — Z923 Personal history of irradiation: Secondary | ICD-10-CM | POA: Diagnosis not present

## 2018-08-28 DIAGNOSIS — N400 Enlarged prostate without lower urinary tract symptoms: Secondary | ICD-10-CM | POA: Diagnosis present

## 2018-08-28 DIAGNOSIS — B370 Candidal stomatitis: Secondary | ICD-10-CM

## 2018-08-28 DIAGNOSIS — Z79891 Long term (current) use of opiate analgesic: Secondary | ICD-10-CM

## 2018-08-28 DIAGNOSIS — C7951 Secondary malignant neoplasm of bone: Secondary | ICD-10-CM | POA: Diagnosis present

## 2018-08-28 DIAGNOSIS — C7931 Secondary malignant neoplasm of brain: Secondary | ICD-10-CM | POA: Diagnosis present

## 2018-08-28 DIAGNOSIS — R000 Tachycardia, unspecified: Secondary | ICD-10-CM | POA: Diagnosis not present

## 2018-08-28 DIAGNOSIS — C189 Malignant neoplasm of colon, unspecified: Secondary | ICD-10-CM | POA: Diagnosis not present

## 2018-08-28 DIAGNOSIS — T451X5A Adverse effect of antineoplastic and immunosuppressive drugs, initial encounter: Secondary | ICD-10-CM

## 2018-08-28 DIAGNOSIS — D61818 Other pancytopenia: Secondary | ICD-10-CM | POA: Diagnosis present

## 2018-08-28 DIAGNOSIS — D649 Anemia, unspecified: Secondary | ICD-10-CM | POA: Diagnosis present

## 2018-08-28 DIAGNOSIS — I110 Hypertensive heart disease with heart failure: Secondary | ICD-10-CM | POA: Diagnosis present

## 2018-08-28 DIAGNOSIS — T368X5A Adverse effect of other systemic antibiotics, initial encounter: Secondary | ICD-10-CM | POA: Diagnosis not present

## 2018-08-28 DIAGNOSIS — J984 Other disorders of lung: Secondary | ICD-10-CM | POA: Diagnosis not present

## 2018-08-28 DIAGNOSIS — E878 Other disorders of electrolyte and fluid balance, not elsewhere classified: Secondary | ICD-10-CM | POA: Diagnosis not present

## 2018-08-28 DIAGNOSIS — Z743 Need for continuous supervision: Secondary | ICD-10-CM | POA: Diagnosis not present

## 2018-08-28 DIAGNOSIS — E785 Hyperlipidemia, unspecified: Secondary | ICD-10-CM | POA: Diagnosis present

## 2018-08-28 DIAGNOSIS — I340 Nonrheumatic mitral (valve) insufficiency: Secondary | ICD-10-CM | POA: Diagnosis not present

## 2018-08-28 DIAGNOSIS — J9601 Acute respiratory failure with hypoxia: Secondary | ICD-10-CM | POA: Diagnosis not present

## 2018-08-28 DIAGNOSIS — I2510 Atherosclerotic heart disease of native coronary artery without angina pectoris: Secondary | ICD-10-CM | POA: Diagnosis not present

## 2018-08-28 DIAGNOSIS — C799 Secondary malignant neoplasm of unspecified site: Secondary | ICD-10-CM | POA: Diagnosis not present

## 2018-08-28 DIAGNOSIS — I252 Old myocardial infarction: Secondary | ICD-10-CM

## 2018-08-28 DIAGNOSIS — C787 Secondary malignant neoplasm of liver and intrahepatic bile duct: Secondary | ICD-10-CM | POA: Diagnosis present

## 2018-08-28 DIAGNOSIS — I5032 Chronic diastolic (congestive) heart failure: Secondary | ICD-10-CM | POA: Diagnosis present

## 2018-08-28 DIAGNOSIS — K861 Other chronic pancreatitis: Secondary | ICD-10-CM | POA: Diagnosis present

## 2018-08-28 DIAGNOSIS — E43 Unspecified severe protein-calorie malnutrition: Secondary | ICD-10-CM | POA: Diagnosis present

## 2018-08-28 DIAGNOSIS — I700 Atherosclerosis of aorta: Secondary | ICD-10-CM | POA: Diagnosis not present

## 2018-08-28 DIAGNOSIS — Z5111 Encounter for antineoplastic chemotherapy: Secondary | ICD-10-CM | POA: Diagnosis not present

## 2018-08-28 DIAGNOSIS — R918 Other nonspecific abnormal finding of lung field: Secondary | ICD-10-CM | POA: Diagnosis not present

## 2018-08-28 DIAGNOSIS — E119 Type 2 diabetes mellitus without complications: Secondary | ICD-10-CM | POA: Diagnosis not present

## 2018-08-28 DIAGNOSIS — IMO0002 Reserved for concepts with insufficient information to code with codable children: Secondary | ICD-10-CM

## 2018-08-28 DIAGNOSIS — D509 Iron deficiency anemia, unspecified: Secondary | ICD-10-CM | POA: Diagnosis present

## 2018-08-28 DIAGNOSIS — Z9221 Personal history of antineoplastic chemotherapy: Secondary | ICD-10-CM | POA: Diagnosis not present

## 2018-08-28 DIAGNOSIS — R279 Unspecified lack of coordination: Secondary | ICD-10-CM | POA: Diagnosis not present

## 2018-08-28 DIAGNOSIS — I10 Essential (primary) hypertension: Secondary | ICD-10-CM | POA: Diagnosis present

## 2018-08-28 DIAGNOSIS — I959 Hypotension, unspecified: Secondary | ICD-10-CM | POA: Diagnosis not present

## 2018-08-28 DIAGNOSIS — R1310 Dysphagia, unspecified: Secondary | ICD-10-CM | POA: Diagnosis present

## 2018-08-28 DIAGNOSIS — E669 Obesity, unspecified: Secondary | ICD-10-CM | POA: Diagnosis present

## 2018-08-28 DIAGNOSIS — C182 Malignant neoplasm of ascending colon: Secondary | ICD-10-CM | POA: Diagnosis not present

## 2018-08-28 DIAGNOSIS — E1142 Type 2 diabetes mellitus with diabetic polyneuropathy: Secondary | ICD-10-CM | POA: Diagnosis present

## 2018-08-28 DIAGNOSIS — D703 Neutropenia due to infection: Secondary | ICD-10-CM | POA: Diagnosis present

## 2018-08-28 DIAGNOSIS — R627 Adult failure to thrive: Secondary | ICD-10-CM | POA: Diagnosis present

## 2018-08-28 DIAGNOSIS — Z9989 Dependence on other enabling machines and devices: Secondary | ICD-10-CM | POA: Diagnosis not present

## 2018-08-28 DIAGNOSIS — E782 Mixed hyperlipidemia: Secondary | ICD-10-CM | POA: Diagnosis present

## 2018-08-28 DIAGNOSIS — J9600 Acute respiratory failure, unspecified whether with hypoxia or hypercapnia: Secondary | ICD-10-CM | POA: Diagnosis not present

## 2018-08-28 DIAGNOSIS — E1159 Type 2 diabetes mellitus with other circulatory complications: Secondary | ICD-10-CM | POA: Diagnosis not present

## 2018-08-28 LAB — COMPREHENSIVE METABOLIC PANEL
ALT: 42 U/L (ref 0–44)
AST: 29 U/L (ref 15–41)
Albumin: 2.3 g/dL — ABNORMAL LOW (ref 3.5–5.0)
Alkaline Phosphatase: 315 U/L — ABNORMAL HIGH (ref 38–126)
Anion gap: 10 (ref 5–15)
BUN: 15 mg/dL (ref 8–23)
CO2: 24 mmol/L (ref 22–32)
Calcium: 7.6 mg/dL — ABNORMAL LOW (ref 8.9–10.3)
Chloride: 97 mmol/L — ABNORMAL LOW (ref 98–111)
Creatinine, Ser: 0.45 mg/dL — ABNORMAL LOW (ref 0.61–1.24)
GFR calc Af Amer: >60 mL/min (ref >60)
GFR calc non Af Amer: >60 mL/min (ref >60)
Glucose, Bld: 261 mg/dL — ABNORMAL HIGH (ref 70–99)
Potassium: 4.6 mmol/L (ref 3.5–5.1)
Sodium: 131 mmol/L — ABNORMAL LOW (ref 135–145)
Total Bilirubin: 0.5 mg/dL (ref 0.3–1.2)
Total Protein: 5.4 g/dL — ABNORMAL LOW (ref 6.5–8.1)

## 2018-08-28 LAB — URINALYSIS, ROUTINE W REFLEX MICROSCOPIC
Bilirubin Urine: NEGATIVE
Glucose, UA: 150 mg/dL — AB
Hgb urine dipstick: NEGATIVE
Ketones, ur: NEGATIVE mg/dL
Leukocytes, UA: NEGATIVE
Nitrite: NEGATIVE
Protein, ur: NEGATIVE mg/dL
Specific Gravity, Urine: 1.015 (ref 1.005–1.030)
pH: 6 (ref 5.0–8.0)

## 2018-08-28 LAB — I-STAT CG4 LACTIC ACID, ED
LACTIC ACID, VENOUS: 4.08 mmol/L — AB (ref 0.5–1.9)
Lactic Acid, Venous: 2.02 mmol/L (ref 0.5–1.9)

## 2018-08-28 LAB — CBC WITH DIFFERENTIAL/PLATELET
Basophils Absolute: 0 10*3/uL (ref 0.0–0.1)
Basophils Relative: 0 %
Eosinophils Absolute: 0 10*3/uL (ref 0.0–0.7)
Eosinophils Relative: 0 %
HCT: 28.3 % — ABNORMAL LOW (ref 39.0–52.0)
Hemoglobin: 9.5 g/dL — ABNORMAL LOW (ref 13.0–17.0)
Lymphocytes Relative: 7 %
Lymphs Abs: 0.2 10*3/uL — ABNORMAL LOW (ref 0.7–4.0)
MCH: 30.9 pg (ref 26.0–34.0)
MCHC: 33.6 g/dL (ref 30.0–36.0)
MCV: 92.2 fL (ref 78.0–100.0)
Monocytes Absolute: 0.1 10*3/uL (ref 0.1–1.0)
Monocytes Relative: 3 %
Neutro Abs: 2.2 10*3/uL (ref 1.7–7.7)
Neutrophils Relative %: 90 %
Platelets: 67 10*3/uL — ABNORMAL LOW (ref 150–400)
RBC: 3.07 MIL/uL — ABNORMAL LOW (ref 4.22–5.81)
RDW: 18.3 % — ABNORMAL HIGH (ref 11.5–15.5)
WBC: 2.4 10*3/uL — ABNORMAL LOW (ref 4.0–10.5)

## 2018-08-28 LAB — MAGNESIUM: Magnesium: 1.8 mg/dL (ref 1.7–2.4)

## 2018-08-28 LAB — LACTIC ACID, PLASMA: LACTIC ACID, VENOUS: 1.9 mmol/L (ref 0.5–1.9)

## 2018-08-28 LAB — GLUCOSE, CAPILLARY
GLUCOSE-CAPILLARY: 210 mg/dL — AB (ref 70–99)
GLUCOSE-CAPILLARY: 287 mg/dL — AB (ref 70–99)
Glucose-Capillary: 165 mg/dL — ABNORMAL HIGH (ref 70–99)
Glucose-Capillary: 221 mg/dL — ABNORMAL HIGH (ref 70–99)

## 2018-08-28 LAB — MRSA PCR SCREENING: MRSA BY PCR: NEGATIVE

## 2018-08-28 MED ORDER — SODIUM CHLORIDE 0.9 % IV BOLUS
500.0000 mL | Freq: Once | INTRAVENOUS | Status: AC
  Administered 2018-08-28: 500 mL via INTRAVENOUS

## 2018-08-28 MED ORDER — NITROGLYCERIN 0.4 MG SL SUBL: 0.4000 mg | SUBLINGUAL_TABLET | SUBLINGUAL | Status: DC | PRN

## 2018-08-28 MED ORDER — ALBUTEROL SULFATE (2.5 MG/3ML) 0.083% IN NEBU: 2.5000 mg | INHALATION_SOLUTION | RESPIRATORY_TRACT | Status: DC | PRN

## 2018-08-28 MED ORDER — SODIUM CHLORIDE 0.9% FLUSH: 3.0000 mL | INTRAVENOUS | Status: DC | PRN

## 2018-08-28 MED ORDER — DRONABINOL 2.5 MG PO CAPS: 2.5000 mg | ORAL_CAPSULE | Freq: Two times a day (BID) | ORAL | Status: DC

## 2018-08-28 MED ORDER — DEXAMETHASONE 2 MG PO TABS
2.0000 mg | ORAL_TABLET | Freq: Every day | ORAL | Status: DC
  Administered 2018-08-28 – 2018-08-30 (×3): 2 mg via ORAL
  Filled 2018-08-28 (×3): qty 1

## 2018-08-28 MED ORDER — TAMSULOSIN HCL 0.4 MG PO CAPS
0.4000 mg | ORAL_CAPSULE | Freq: Every day | ORAL | Status: DC
  Administered 2018-08-28 – 2018-09-07 (×10): 0.4 mg via ORAL
  Filled 2018-08-28 (×11): qty 1

## 2018-08-28 MED ORDER — SODIUM CHLORIDE 0.9% FLUSH
3.0000 mL | Freq: Two times a day (BID) | INTRAVENOUS | Status: DC
  Administered 2018-08-28 – 2018-09-07 (×17): 3 mL via INTRAVENOUS
  Administered 2018-09-08: 10 mL via INTRAVENOUS
  Administered 2018-09-08 – 2018-09-18 (×12): 3 mL via INTRAVENOUS

## 2018-08-28 MED ORDER — INSULIN ASPART 100 UNIT/ML ~~LOC~~ SOLN
0.0000 [IU] | Freq: Three times a day (TID) | SUBCUTANEOUS | Status: DC
  Administered 2018-08-28: 3 [IU] via SUBCUTANEOUS
  Administered 2018-08-28: 2 [IU] via SUBCUTANEOUS
  Administered 2018-08-29: 5 [IU] via SUBCUTANEOUS
  Administered 2018-08-29 (×2): 2 [IU] via SUBCUTANEOUS
  Administered 2018-08-30: 9 [IU] via SUBCUTANEOUS
  Administered 2018-08-30: 3 [IU] via SUBCUTANEOUS
  Administered 2018-08-30 – 2018-08-31 (×2): 7 [IU] via SUBCUTANEOUS
  Administered 2018-08-31: 1 [IU] via SUBCUTANEOUS
  Administered 2018-09-01: 3 [IU] via SUBCUTANEOUS
  Administered 2018-09-02: 5 [IU] via SUBCUTANEOUS
  Administered 2018-09-02 – 2018-09-03 (×2): 3 [IU] via SUBCUTANEOUS
  Administered 2018-09-03: 5 [IU] via SUBCUTANEOUS
  Administered 2018-09-03 – 2018-09-04 (×2): 3 [IU] via SUBCUTANEOUS
  Administered 2018-09-04: 7 [IU] via SUBCUTANEOUS
  Administered 2018-09-04: 3 [IU] via SUBCUTANEOUS
  Administered 2018-09-05: 5 [IU] via SUBCUTANEOUS
  Administered 2018-09-05: 9 [IU] via SUBCUTANEOUS
  Administered 2018-09-05: 5 [IU] via SUBCUTANEOUS

## 2018-08-28 MED ORDER — MORPHINE SULFATE ER 15 MG PO TBCR
15.0000 mg | EXTENDED_RELEASE_TABLET | Freq: Every day | ORAL | Status: DC
  Administered 2018-09-01 – 2018-09-10 (×10): 15 mg via ORAL
  Filled 2018-08-28 (×14): qty 1

## 2018-08-28 MED ORDER — HYDRALAZINE HCL 20 MG/ML IJ SOLN
10.0000 mg | Freq: Four times a day (QID) | INTRAMUSCULAR | Status: DC | PRN
  Administered 2018-08-28: 10 mg via INTRAVENOUS
  Filled 2018-08-28 (×2): qty 1

## 2018-08-28 MED ORDER — MAGIC MOUTHWASH
5.0000 mL | Freq: Four times a day (QID) | ORAL | Status: DC | PRN
  Administered 2018-08-29 – 2018-09-12 (×4): 5 mL via ORAL
  Filled 2018-08-28 (×6): qty 5

## 2018-08-28 MED ORDER — ACETAMINOPHEN 500 MG PO TABS: 500.0000 mg | ORAL_TABLET | Freq: Four times a day (QID) | ORAL | Status: DC | PRN

## 2018-08-28 MED ORDER — ACETAMINOPHEN 325 MG PO TABS
650.0000 mg | ORAL_TABLET | Freq: Four times a day (QID) | ORAL | Status: DC | PRN
  Administered 2018-08-28 – 2018-09-22 (×21): 650 mg via ORAL
  Filled 2018-08-28 (×22): qty 2

## 2018-08-28 MED ORDER — HEPARIN SODIUM (PORCINE) 5000 UNIT/ML IJ SOLN
5000.0000 [IU] | Freq: Three times a day (TID) | INTRAMUSCULAR | Status: DC
  Administered 2018-08-28 – 2018-08-29 (×2): 5000 [IU] via SUBCUTANEOUS
  Filled 2018-08-28 (×2): qty 1

## 2018-08-28 MED ORDER — VANCOMYCIN HCL 10 G IV SOLR
1250.0000 mg | Freq: Two times a day (BID) | INTRAVENOUS | Status: DC
  Administered 2018-08-28 – 2018-08-30 (×4): 1250 mg via INTRAVENOUS
  Filled 2018-08-28 (×4): qty 1250

## 2018-08-28 MED ORDER — LORAZEPAM 0.5 MG PO TABS
0.5000 mg | ORAL_TABLET | Freq: Three times a day (TID) | ORAL | Status: DC | PRN
  Administered 2018-08-29 – 2018-08-31 (×2): 0.5 mg via ORAL
  Filled 2018-08-28 (×2): qty 1

## 2018-08-28 MED ORDER — ATORVASTATIN CALCIUM 10 MG PO TABS
10.0000 mg | ORAL_TABLET | Freq: Every day | ORAL | Status: DC
  Administered 2018-08-28 – 2018-09-07 (×8): 10 mg via ORAL
  Filled 2018-08-28 (×9): qty 1

## 2018-08-28 MED ORDER — LISINOPRIL 2.5 MG PO TABS
2.5000 mg | ORAL_TABLET | Freq: Every day | ORAL | Status: DC
  Administered 2018-08-28 – 2018-09-07 (×11): 2.5 mg via ORAL
  Filled 2018-08-28 (×11): qty 1

## 2018-08-28 MED ORDER — ONDANSETRON HCL 4 MG PO TABS: 4.0000 mg | ORAL_TABLET | Freq: Four times a day (QID) | ORAL | Status: DC | PRN

## 2018-08-28 MED ORDER — PROMETHAZINE HCL 25 MG PO TABS: 12.5000 mg | ORAL_TABLET | Freq: Four times a day (QID) | ORAL | Status: DC | PRN

## 2018-08-28 MED ORDER — ONDANSETRON HCL 4 MG/2ML IJ SOLN: 4.0000 mg | Freq: Four times a day (QID) | INTRAMUSCULAR | Status: DC | PRN

## 2018-08-28 MED ORDER — LORAZEPAM 0.5 MG PO TABS: 0.5000 mg | ORAL_TABLET | Freq: Three times a day (TID) | ORAL | Status: DC | PRN

## 2018-08-28 MED ORDER — PROCHLORPERAZINE MALEATE 10 MG PO TABS: 5.0000 mg | ORAL_TABLET | Freq: Four times a day (QID) | ORAL | Status: DC | PRN

## 2018-08-28 MED ORDER — MORPHINE SULFATE (PF) 2 MG/ML IV SOLN
2.0000 mg | INTRAVENOUS | Status: DC | PRN
  Administered 2018-09-01: 2 mg via INTRAVENOUS
  Filled 2018-08-28: qty 1

## 2018-08-28 MED ORDER — VANCOMYCIN HCL 10 G IV SOLR
1500.0000 mg | Freq: Once | INTRAVENOUS | Status: AC
  Administered 2018-08-28: 1500 mg via INTRAVENOUS
  Filled 2018-08-28: qty 1500

## 2018-08-28 MED ORDER — SODIUM CHLORIDE 0.9 % IV BOLUS
1500.0000 mL | Freq: Once | INTRAVENOUS | Status: AC
  Administered 2018-08-28: 1500 mL via INTRAVENOUS

## 2018-08-28 MED ORDER — ACETAMINOPHEN 650 MG RE SUPP
650.0000 mg | Freq: Four times a day (QID) | RECTAL | Status: DC | PRN
  Administered 2018-09-01 – 2018-09-02 (×2): 650 mg via RECTAL
  Filled 2018-08-28 (×2): qty 1

## 2018-08-28 MED ORDER — GABAPENTIN 300 MG PO CAPS
300.0000 mg | ORAL_CAPSULE | Freq: Three times a day (TID) | ORAL | Status: DC
  Administered 2018-08-28 – 2018-09-11 (×41): 300 mg via ORAL
  Filled 2018-08-28 (×42): qty 1

## 2018-08-28 MED ORDER — METOPROLOL TARTRATE 25 MG PO TABS
25.0000 mg | ORAL_TABLET | Freq: Two times a day (BID) | ORAL | Status: DC
  Administered 2018-08-28: 25 mg via ORAL
  Filled 2018-08-28: qty 1

## 2018-08-28 MED ORDER — METOCLOPRAMIDE HCL 10 MG PO TABS: 10.0000 mg | ORAL_TABLET | Freq: Three times a day (TID) | ORAL | Status: DC

## 2018-08-28 MED ORDER — SODIUM CHLORIDE 0.9 % IV SOLN
250.0000 mL | INTRAVENOUS | Status: DC | PRN
  Administered 2018-08-30: 250 mL via INTRAVENOUS

## 2018-08-28 MED ORDER — PANTOPRAZOLE SODIUM 40 MG PO TBEC
40.0000 mg | DELAYED_RELEASE_TABLET | Freq: Every day | ORAL | Status: DC
  Administered 2018-08-29 – 2018-09-05 (×8): 40 mg via ORAL
  Filled 2018-08-28 (×8): qty 1

## 2018-08-28 MED ORDER — ACETAMINOPHEN 500 MG PO TABS
1000.0000 mg | ORAL_TABLET | Freq: Once | ORAL | Status: AC
  Administered 2018-08-28: 1000 mg via ORAL
  Filled 2018-08-28: qty 2

## 2018-08-28 MED ORDER — SODIUM CHLORIDE 0.9 % IV BOLUS
1000.0000 mL | Freq: Once | INTRAVENOUS | Status: AC
  Administered 2018-08-28: 1000 mL via INTRAVENOUS

## 2018-08-28 MED ORDER — INSULIN GLARGINE 100 UNIT/ML ~~LOC~~ SOLN
22.0000 [IU] | Freq: Every day | SUBCUTANEOUS | Status: DC
  Administered 2018-08-28 – 2018-08-29 (×2): 22 [IU] via SUBCUTANEOUS
  Filled 2018-08-28 (×2): qty 0.22

## 2018-08-28 MED ORDER — COQ10 200 MG PO CAPS: 200.0000 mg | ORAL_CAPSULE | Freq: Every day | ORAL | Status: DC

## 2018-08-28 MED ORDER — SODIUM CHLORIDE 0.9 % IV BOLUS: 1000.0000 mL | Freq: Once | INTRAVENOUS | Status: DC

## 2018-08-28 MED ORDER — ONDANSETRON 4 MG PO TBDP: 4.0000 mg | ORAL_TABLET | Freq: Three times a day (TID) | ORAL | Status: DC | PRN

## 2018-08-28 MED ORDER — INSULIN ASPART 100 UNIT/ML ~~LOC~~ SOLN
0.0000 [IU] | Freq: Every day | SUBCUTANEOUS | Status: DC
  Administered 2018-08-28: 3 [IU] via SUBCUTANEOUS
  Administered 2018-08-29: 4 [IU] via SUBCUTANEOUS
  Administered 2018-08-30: 3 [IU] via SUBCUTANEOUS
  Administered 2018-08-31 – 2018-09-01 (×2): 2 [IU] via SUBCUTANEOUS
  Administered 2018-09-02: 3 [IU] via SUBCUTANEOUS
  Administered 2018-09-03: 4 [IU] via SUBCUTANEOUS

## 2018-08-28 MED ORDER — DIPHENOXYLATE-ATROPINE 2.5-0.025 MG PO TABS: 2.0000 | ORAL_TABLET | Freq: Four times a day (QID) | ORAL | Status: DC | PRN

## 2018-08-28 MED ORDER — INSULIN GLARGINE 100 UNIT/ML ~~LOC~~ SOLN: 35.0000 [IU] | Freq: Every day | SUBCUTANEOUS | Status: DC

## 2018-08-28 MED ORDER — ORAL CARE MOUTH RINSE
15.0000 mL | Freq: Two times a day (BID) | OROMUCOSAL | Status: DC
  Administered 2018-08-28 – 2018-09-04 (×11): 15 mL via OROMUCOSAL

## 2018-08-28 MED ORDER — TRAZODONE HCL 50 MG PO TABS
50.0000 mg | ORAL_TABLET | Freq: Every evening | ORAL | Status: DC | PRN
  Administered 2018-08-31 – 2018-09-21 (×7): 50 mg via ORAL
  Filled 2018-08-28 (×9): qty 1

## 2018-08-28 MED ORDER — POLYETHYLENE GLYCOL 3350 17 G PO PACK
17.0000 g | PACK | Freq: Every day | ORAL | Status: DC | PRN
  Administered 2018-09-13 – 2018-09-21 (×7): 17 g via ORAL
  Filled 2018-08-28 (×8): qty 1

## 2018-08-28 MED ORDER — METOCLOPRAMIDE HCL 10 MG PO TABS
10.0000 mg | ORAL_TABLET | Freq: Two times a day (BID) | ORAL | Status: DC | PRN
  Administered 2018-08-30 – 2018-08-31 (×2): 10 mg via ORAL
  Filled 2018-08-28 (×2): qty 1

## 2018-08-28 MED ORDER — PIPERACILLIN-TAZOBACTAM 3.375 G IVPB
3.3750 g | Freq: Three times a day (TID) | INTRAVENOUS | Status: DC
  Administered 2018-08-28 – 2018-08-31 (×9): 3.375 g via INTRAVENOUS
  Filled 2018-08-28 (×9): qty 50

## 2018-08-28 MED ORDER — MORPHINE SULFATE ER 15 MG PO TBCR: 15.0000 mg | EXTENDED_RELEASE_TABLET | Freq: Three times a day (TID) | ORAL | Status: DC

## 2018-08-28 MED ORDER — SODIUM CHLORIDE 0.9 % IV SOLN
INTRAVENOUS | Status: DC
  Administered 2018-08-28 – 2018-08-29 (×3): via INTRAVENOUS
  Administered 2018-08-29: 150 mL/h via INTRAVENOUS

## 2018-08-28 MED ORDER — PIPERACILLIN-TAZOBACTAM 3.375 G IVPB 30 MIN
3.3750 g | Freq: Once | INTRAVENOUS | Status: AC
  Administered 2018-08-28: 3.375 g via INTRAVENOUS
  Filled 2018-08-28: qty 50

## 2018-08-28 NOTE — ED Notes (Signed)
I gave critical I Stat CG4 results to MD Melina Copa

## 2018-08-28 NOTE — Progress Notes (Signed)
PHARMACIST - PHYSICIAN ORDER COMMUNICATION  CONCERNING: P&T Medication Policy on Herbal Medications  DESCRIPTION:  This patient's order for:  Coenzyme Q10  has been noted.  This product(s) is classified as an "herbal" or natural product. Due to a lack of definitive safety studies or FDA approval, nonstandard manufacturing practices, plus the potential risk of unknown drug-drug interactions while on inpatient medications, the Pharmacy and Therapeutics Committee does not permit the use of "herbal" or natural products of this type within [facility].   ACTION TAKEN: The pharmacy department is unable to verify this order at this time and your patient has been informed of this safety policy. Please reevaluate patient's clinical condition at discharge and address if the herbal or natural product(s) should be resumed at that time.    Lindell Spar, PharmD, BCPS Pager: [phone] 08/28/2018 2:41 PM

## 2018-08-28 NOTE — Progress Notes (Signed)
Dr Denton Brick notified of elevated B/P and patients wife request that blood cultures were done yesterday at cancer center.

## 2018-08-28 NOTE — ED Notes (Signed)
Informed pt that we need a urine specimen.

## 2018-08-28 NOTE — Progress Notes (Signed)
These preliminary result these preliminary results were noted.  Awaiting final report.

## 2018-08-28 NOTE — Progress Notes (Signed)
Pharmacy Antibiotic Note  Allen Glenn is a 62 y.o. male admitted on 08/28/2018 with sepsis.  Pharmacy has been consulted for Vancomycin and Zosyn dosing.  Plan:  Vancomycin 1500mg  IV x 1 given in the ED. Continue with Vancomycin 1250mg  IV q12h.  Plan for Vancomycin levels at steady state, as indicated.  Zosyn 3.375g IV x 1 over 30 minutes given in the ED. Continue with Zosyn 3.375g IV q8h (each dose infused over 4 hours).  Monitor renal function (daily SCr), cultures, clinical course.   Height: 6\' 2"  (188 cm) Weight: 185 lb 10 oz (84.2 kg) IBW/kg (Calculated) : 82.2  Temp (24hrs), Avg:100.4 F (38 C), Min:98.5 F (36.9 C), Max:102.3 F (39.1 C)  Recent Labs  Lab 08/27/18 0931 08/28/18 0823 08/28/18 0842 08/28/18 1143  WBC 2.8* 2.4*  --   --   CREATININE 0.52* 0.45*  --   --   LATICACIDVEN  --   --  4.08* 2.02*    Estimated Creatinine Clearance: 112.7 mL/min (A) (by C-G formula based on SCr of 0.45 mg/dL (L)).    No Known Allergies  Antimicrobials this admission: 9/5 Vancomycin >>  9/5 Zosyn >>   Dose adjustments this admission: --  Microbiology results: 9/4 BCx (at [facility]): NGTD 9/5 MRSA PCR: negative   Thank you for allowing pharmacy to be a part of this patient's care.   Lindell Spar, PharmD, BCPS Pager: [phone] 08/28/2018 5:30 PM

## 2018-08-28 NOTE — ED Notes (Signed)
CBG was unable to be accurately obtained due to pt just finishing lunch

## 2018-08-28 NOTE — H&P (Addendum)
Patient Demographics:    Allen Glenn, is a 62 y.o. male  MRN: [id number]   DOB - 08-06-1956  Admit Date - 08/28/2018  Outpatient Primary MD for the patient is [name], MD   Assessment & Plan:    Principal Problem:   Sepsis [facility]) Active Problems:   Colon cancer s/p right colectomy    Metastatic colon cancer to liver ([place])   HTN (hypertension)   Insulin dependent type 2 diabetes mellitus, uncontrolled ([place])   Pancytopenia ([place])   Neutropenia with fever ([place])   Fever    1)Sepsis in a patient with pancytopenia/leukopenia--- on admission patient met sepsis criteria with persistent fevers, persistent tachypnea, and persistent tachycardia and leukopenia,  lactic acid improved to 2.0 from 4.0 after hydration, treat empirically with IV Vanco and Zosyn pending blood cultures from 08/27/2018 and  repeat blood cultures from 08/28/2018, No focus of infection has been identified at this time.  Monitor CBC especially ANC closely  2) stage IV colon cancer with mets--last chemo 08/19/2018, Dr. [name] from oncology to see, continue Decadron 2 mg daily  3)DM2-last A1c was 7.9, Lantus insulin has been reduced to 22 units nightly due to poor oral intake, Allow some permissive Hyperglycemia rather than risk life-threatening hypoglycemia in a patient with unreliable oral intake. Use Novolog/Humalog Sliding scale insulin with Accu-Cheks/Fingersticks as ordered  4)Prophylaxis--please discontinue subcu heparin for DVT prophylaxis if platelet count drop below 50,000 if any concerns about bleeding  5)HTN-stable, treat empirically with lisinopril 2.5 mg daily metoprolol 25 mg twice daily,   may use IV Hydralazine 10 mg  Every 4 hours Prn for systolic blood pressure over 160 mmhg  With History of - Reviewed by me  Past Medical  History:  Diagnosis Date  . Acute MI ([place]) 2014   acute ST elevation MI  . Anemia   . Colon cancer ([place]) 2016   Status post resection of colon mass is well as he panic metastasis.   . Coronary artery disease   . Diabetes mellitus without complication ([place])   . Enlarged prostate    slightly and takes Flomax daily  . Heart disease   . History of blood transfusion   . Hyperlipidemia    takes Lipitor daily  . Hypertension    takes Lisinopril daily  . Lung nodule    left  . Nocturia   . Numbness    left foot  . Pancreatitis   . Peripheral neuropathy       Past Surgical History:  Procedure Laterality Date  . 1/8 of liver removed    . APPENDECTOMY    . APPLICATION OF CRANIAL NAVIGATION Left 06/04/2018   Procedure: APPLICATION OF CRANIAL NAVIGATION;  Surgeon: Erline Levine, MD;  Location: [place];  Service: Neurosurgery;  Laterality: Left;  . BIOPSY  05/22/2018   Procedure: BIOPSY;  Surgeon: Carol Ada, MD;  Location: [facility];  Service: Endoscopy;;  . BRAIN SURGERY  06/04/2018  .  COLONOSCOPY    . CORONARY ANGIOPLASTY     1 stent  . CORONARY STENT PLACEMENT  06-27-2013  . CRANIOTOMY Left 06/04/2018   Procedure: CRANIOTOMY TUMOR EXCISION with Brainlab;  Surgeon: Erline Levine, MD;  Location[place];  Service: Neurosurgery;  Laterality: Left;  CRANIOTOMY TUMOR EXCISION with Brainlab  . ESOPHAGOGASTRODUODENOSCOPY Left 05/22/2018   Procedure: ESOPHAGOGASTRODUODENOSCOPY (EGD);  Surgeon: Carol Ada, MD;  Location: [facility];  Service: Endoscopy;  Laterality: Left;  . HERNIA REPAIR Left 1991  . IR IMAGING GUIDED PORT INSERTION  07/18/2018  . LAPAROSCOPIC RIGHT COLECTOMY  2016   [place]  . LIVER LOBECTOMY Right 09/28/2015    Right partial hepate[facility] at [facility]  . LUNG BIOPSY Left 11/28/2016   Procedure: LUNG BIOPSY, left upper lobe;  Surgeon: [name], MD;  Location: [place];  Service: Thoracic;  Laterality: Left;  Marland Kitchen VIDEO BRONCHOSCOPY WITH ENDOBRONCHIAL NAVIGATION N/A  11/28/2016   Procedure: VIDEO BRONCHOSCOPY WITH ENDOBRONCHIAL NAVIGATION;  Surgeon: [name], MD;  Location: [place];  Service: Thoracic;  Laterality: N/A;  . VIDEO BRONCHOSCOPY WITH ENDOBRONCHIAL ULTRASOUND N/A 11/28/2016   Procedure: VIDEO BRONCHOSCOPY WITH ENDOBRONCHIAL ULTRASOUND;  Surgeon: [name], MD;  Location: [place];  Service: Thoracic;  Laterality: N/A;      Chief Complaint  Patient presents with  . Fever  . Tachycardia      HPI:    Allen Glenn  is a 62 y.o. male with PMHx of diabetes, history of CAD, dyslipidemia, hypertension as well as history of metastatic colon cancer charted on chemo on 08/19/2018 presenting to the ED with fatigue, fevers up to 103, persistent tachycardia, tachypnea of 3-day duration On 08/27/2018 at oncology clinic symptom management area, CTA chest from 08/27/2018 without PE or other acute findings patient does have metastatic lesions  No productive cough, no urinary symptoms,  Patient wife is an Therapist, sports and diabetic coordinator, additional history obtained from  In ED-patient was found to have a lactic acid above 4, after IV hydration lactic acid came down to 2.0  In ED pancytocytopenia noted, ANC is above 2 UA is not suggestive of UTI    Review of systems:    In addition to the HPI above,   A full Review of  Systems was done, all other systems reviewed are negative except as noted above in HPI , .    Social History:  Reviewed by me    Social History   Tobacco Use  . Smoking status: Never Smoker  . Smokeless tobacco: Never Used  Substance Use Topics  . Alcohol use: No    Alcohol/week: 0.0 standard drinks       Family History :  Reviewed by me    Family History  Problem Relation Age of Onset  . Hypertension Mother   . Heart disease Mother   . Hypertension Father   . Heart disease Father   . Neuropathy Neg Hx      Home Medications:   Prior to Admission medications   Medication Sig Start Date End Date Taking?  Authorizing Provider  acetaminophen (TYLENOL) 500 MG tablet Take 500 mg by mouth every 6 (six) hours as needed (FOR PAIN/HEADACHES.).    Yes [provider]  atorvastatin (LIPITOR) 10 MG tablet Take 10 mg by mouth daily.    Yes [provider]  clotrimazole (MYCELEX) 10 MG troche TAKE 1 TABLET (10 MG TOTAL) BY MOUTH 4 TIMES A DAY. TAKE WHILE ON DECADRON Patient taking differently: Take 10 mg  by mouth 4 (four) times daily as needed (thrush).  08/20/18  Yes Ladell Pier, MD  Coenzyme Q10 (COQ10) 200 MG CAPS Take 200 mg by mouth daily.   Yes [provider]  dexamethasone (DECADRON) 2 MG tablet Take 1 tablet (2 mg total) by mouth daily. 08/21/18  Yes Ladell Pier, MD  diphenoxylate-atropine (LOMOTIL) 2.5-0.025 MG tablet Take 2 tablets by mouth 4 (four) times daily as needed for diarrhea or loose stools. 08/12/18  Yes Owens Shark, NP  gabapentin (NEURONTIN) 300 MG capsule Take 1 capsule (300 mg total) by mouth 3 (three) times daily. 1 to 2 PO QHS Patient taking differently: Take 300 mg by mouth 3 (three) times daily.  06/25/18  Yes Hayden Pedro, PA-C  HUMALOG KWIKPEN 100 UNIT/ML KiwkPen Inject 2-15 Units into the skin See admin instructions. 121-150=2 UNITS, 151-200=3 UNITS, 201-250=5 UNITS, 251-300=8 UNITS, 301-350=11 UNITS, 351-400 = 15 UNITS Pt takes meal coverage: 1 unit/5 grams of carbs 05/26/18  Yes [provider]  insulin glargine (LANTUS) 100 UNIT/ML injection Inject 22 Units into the skin at bedtime.    Yes [provider]  lidocaine-prilocaine (EMLA) cream Apply 1 application topically as needed. Patient taking differently: Apply 1 application topically as needed (port access).  07/18/18  Yes Ladell Pier, MD  lisinopril (PRINIVIL,ZESTRIL) 2.5 MG tablet Take 2.5 mg by mouth daily. 02/16/15  Yes [provider]  LORazepam (ATIVAN) 0.5 MG tablet Take 1 tablet (0.5 mg total) by mouth every 8 (eight) hours as needed for anxiety  (prn nausea). Take 1 tablet by mouth before bedtime as needed. Patient taking differently: Take 0.5 mg by mouth every 8 (eight) hours as needed (nausea).  08/19/18  Yes Ladell Pier, MD  magic mouthwash SOLN Take 5 mLs by mouth 4 (four) times daily as needed for mouth pain. Patient taking differently: Take 5 mLs by mouth 3 (three) times daily.  07/28/18  Yes Tanner, Lyndon Code., PA-C  metoCLOPramide (REGLAN) 10 MG tablet Take 1 tablet (10 mg total) by mouth 3 (three) times daily before meals. Patient taking differently: Take 10 mg by mouth 2 (two) times daily as needed for nausea.  07/28/18  Yes Tanner, Lyndon Code., PA-C  mirtazapine (REMERON) 7.5 MG tablet Take 1 tablet (7.5 mg total) by mouth at bedtime. 08/13/18  Yes Ladell Pier, MD  morphine (MS CONTIN) 15 MG 12 hr tablet TAKE 1 TABLET (15 MG TOTAL) BY MOUTH EVERY 12 HOURS. Patient taking differently: Take 15 mg by mouth every 12 (twelve) hours as needed for pain.  07/17/18  Yes Hayden Pedro, PA-C  nitroGLYCERIN (NITROSTAT) 0.4 MG SL tablet Place 0.4 mg under the tongue every 5 (five) minutes as needed for chest pain. CALL 911 IF PAIN PERSISTS 11/24/14  Yes [provider]  omeprazole (PRILOSEC) 20 MG capsule Take 20 mg by mouth daily.   Yes [provider]  ondansetron (ZOFRAN-ODT) 4 MG disintegrating tablet Take 1 tablet (4 mg total) by mouth every 8 (eight) hours as needed for nausea or vomiting. 07/18/18  Yes Oretha Milch D, MD  promethazine (PHENERGAN) 12.5 MG tablet Take 1 tablet (12.5 mg total) by mouth every 6 (six) hours as needed for nausea or vomiting. 07/24/18  Yes Ladell Pier, MD  tamsulosin (FLOMAX) 0.4 MG CAPS capsule Take 0.4 mg by mouth daily after breakfast. 30 minutes after breakfast 02/24/15  Yes [provider]  trolamine salicylate (ASPERCREME) 10 % cream Apply 1 application topically 2 (  two) times daily as needed (FOR BACK PAIN.).   Yes [provider]  Continuous Blood Gluc Sensor  (FREESTYLE LIBRE 14 DAY SENSOR) MISC 1 application by Does not apply route every 14 (fourteen) days. 06/06/18   Mikhail, Velta Addison, DO  dronabinol (MARINOL) 2.5 MG capsule Take 1 capsule (2.5 mg total) by mouth 2 (two) times daily before lunch and supper. Patient not taking: Reported on 08/28/2018 07/18/18   Oretha Milch D, MD  Insulin Pen Needle 31G X 5 MM MISC Use with insulin pen. 05/26/18   Mikhail, Velta Addison, DO  methocarbamol (ROBAXIN) 500 MG tablet Take 500 mg by mouth every 8 (eight) hours as needed. 07/08/18   [provider]  morphine (MS CONTIN) 30 MG 12 hr tablet Take 1 tablet (30 mg total) by mouth 3 (three) times daily. Patient not taking: Reported on 07/28/2018 07/02/18   Ladell Pier, MD  morphine (MSIR) 15 MG tablet Take 1 tablet (15 mg total) by mouth every 4 (four) hours as needed for severe pain. Patient not taking: Reported on 08/28/2018 07/02/18   Ladell Pier, MD  prochlorperazine (COMPAZINE) 5 MG tablet Take 1 tablet (5 mg total) by mouth every 6 (six) hours as needed for nausea or vomiting. Patient not taking: Reported on 08/27/2018 08/19/18   Ladell Pier, MD     Allergies:    No Known Allergies   Physical Exam:   Vitals  Blood pressure (!) 136/49, pulse (!) 129, temperature 99.8 F (37.7 C), temperature source Oral, resp. rate (!) 38, height 6' 2"  (1.88 m), weight 84.2 kg, SpO2 95 %.  Physical Examination: General appearance - alert, ill appearing, and in no distress  Mental status - alert, oriented to person, place, and time,  Eyes - sclera anicteric Neck - supple, no JVD elevation , Chest - clear  to auscultation bilaterally, symmetrical air movement,  Heart - S1 and S2 normal, tachycardic with heart rate in the 130 range Abdomen - soft, nontender, nondistended, no masses or organomegaly Neurological - screening mental status exam normal, neck supple without rigidity, cranial nerves II through XII intact, DTR's normal and symmetric Extremities - no  pedal edema noted, intact peripheral pulses  Skin - warm, dry     Data Review:    CBC Recent Labs  Lab 08/27/18 0931 08/28/18 0823  WBC 2.8* 2.4*  HGB 10.1* 9.5*  HCT 30.6* 28.3*  PLT 63* 67*  MCV 91.9 92.2  MCH 30.3 30.9  MCHC 33.0 33.6  RDW 18.3* 18.3*  LYMPHSABS 0.1* 0.2*  MONOABS 0.2 0.1  EOSABS 0.0 0.0  BASOSABS 0.0 0.0   ------------------------------------------------------------------------------------------------------------------  Chemistries  Recent Labs  Lab 08/27/18 0931 08/28/18 0823  NA 130* 131*  K 4.0 4.6  CL 98 97*  CO2 22 24  GLUCOSE 259* 261*  BUN 17 15  CREATININE 0.52* 0.45*  CALCIUM 7.8* 7.6*  MG  --  1.8  AST 24 29  ALT 46* 42  ALKPHOS 367* 315*  BILITOT 0.5 0.5   ------------------------------------------------------------------------------------------------------------------ estimated creatinine clearance is 112.7 mL/min (A) (by C-G formula based on SCr of 0.45 mg/dL (L)). ------------------------------------------------------------------------------------------------------------------ No results for input(s): TSH, T4TOTAL, T3FREE, THYROIDAB in the last 72 hours.  Invalid input(s): FREET3   Coagulation profile No results for input(s): INR, PROTIME in the last 168 hours. ------------------------------------------------------------------------------------------------------------------- No results for input(s): DDIMER in the last 72 hours. -------------------------------------------------------------------------------------------------------------------  Cardiac Enzymes No results for input(s): CKMB, TROPONINI, MYOGLOBIN in the last 168 hours.  Invalid input(s): CK ------------------------------------------------------------------------------------------------------------------  Component Value Date/Time   BNP 14.5 08/27/2018 0931     --------------------------------------------------------------------------------------------------------------  Urinalysis    Component Value Date/Time   COLORURINE YELLOW 08/28/2018 1051   APPEARANCEUR CLEAR 08/28/2018 1051   LABSPEC 1.015 08/28/2018 1051   PHURINE 6.0 08/28/2018 1051   GLUCOSEU 150 (A) 08/28/2018 1051   HGBUR NEGATIVE 08/28/2018 1051   BILIRUBINUR NEGATIVE 08/28/2018 1051   KETONESUR NEGATIVE 08/28/2018 1051   PROTEINUR NEGATIVE 08/28/2018 1051   NITRITE NEGATIVE 08/28/2018 1051   LEUKOCYTESUR NEGATIVE 08/28/2018 1051    ----------------------------------------------------------------------------------------------------------------   Imaging Results:    Dg Chest 2 View  Result Date: 08/27/2018 CLINICAL DATA:  62 year old with current history of metastatic colon cancer for which the patient is undergoing chemotherapy, presenting with two-day history of fatigue, generalized weakness and tachycardia. EXAM: CHEST - 2 VIEW COMPARISON:  Chest x-rays 07/14/2018, 12/06/2016 and earlier. CT chest 02/25/2018, 11/26/2017 and earlier. MRI thoracic spine 07/04/2018 is correlated. FINDINGS: Cardiac silhouette normal in size, unchanged. Thoracic aorta mildly atherosclerotic, unchanged. Hilar and mediastinal contours otherwise unremarkable. RIGHT jugular Port-A-Cath tip in the UPPER RIGHT atrium. Evolving post radiation changes involving the INFERIOR LEFT UPPER LOBE which now has the appearance of linear scarring. Atelectasis involving the RIGHT LOWER LOBE and RIGHT MIDDLE LOBE, with associated mild elevation of the RIGHT hemidiaphragm. No confluent airspace consolidation. Metastasis involving the RIGHT ANTERIOR fourth rib with a mixed lytic and sclerotic appearance, overlying the RIGHT mid lung and RIGHT lung base on the PA image. Mixed lytic and sclerotic metastases involving the mid and UPPER thoracic spine as noted on the recent MRI. IMPRESSION: 1. Mild atelectasis involving the  RIGHT LOWER LOBE and RIGHT MIDDLE LOBE. No acute cardiopulmonary disease otherwise. 2. Involving post radiation changes involving the INFERIOR LEFT UPPER LOBE which now has the appearance of linear scarring. 3. Osseous metastatic disease involving the RIGHT ANTERIOR fourth rib and multiple mid and UPPER thoracic vertebrae. Electronically Signed   By: Evangeline Dakin M.D.   On: 08/27/2018 13:10   Ct Angio Chest Pe W Or Wo Contrast  Result Date: 08/27/2018 CLINICAL DATA:  62 year old male with metastatic colon cancer. Shortness of breath for 2 days. EXAM: CT ANGIOGRAPHY CHEST WITH CONTRAST TECHNIQUE: Multidetector CT imaging of the chest was performed using the standard protocol during bolus administration of intravenous contrast. Multiplanar CT image reconstructions and MIPs were obtained to evaluate the vascular anatomy. CONTRAST:  164m ISOVUE-370 IOPAMIDOL (ISOVUE-370) INJECTION 76% COMPARISON:  Restaging CT chest abdomen and pelvis 02/25/2018 and earlier. Thoracic and lumbar spine MRI 07/04/2018. FINDINGS: Cardiovascular: Good contrast bolus timing in the pulmonary arterial tree. Respiratory motion in the lower lobes. No focal filling defect identified in the pulmonary arteries to suggest acute pulmonary embolism. Calcified aortic atherosclerosis. Calcified coronary artery atherosclerosis and/or stent. Right chest porta cath is new since March. No cardiomegaly or pericardial effusion. Mediastinum/Nodes: No mediastinal lymphadenopathy, but there is nonspecific increasing left hilar soft tissue as seen on series 5, image 147, measuring up to 11 millimeters in thickness. The right hilum appears stable and negative. Negative thoracic inlet. Lungs/Pleura: Lower lung volumes.  The major airways are patent. Increased size and number of numerous pulmonary nodules throughout both lungs. Nodules range from punctate to 8-9 millimeters diameter. Superimposed right greater than left costophrenic angle atelectasis. Trace  right pleural effusion. Upper Abdomen: Hyperenhancing liver metastases demonstrated on 02/25/2018 are less apparent today, but likely related to contrast timing as with narrow liver windows a large anterior right liver mass now measures at least 6  centimeters diameter (approximately 3 centimeters previously). Negative visible spleen, pancreas, adrenal glands, kidneys and bowel in the upper abdomen. Musculoskeletal: New sclerotic thoracic vertebral lesions at T1 and T4 through T6 levels are compatible with osseous metastatic disease. Confluent vertebral body involvement T4 through T6 is associated with mild pathologic fracture of T5 and bulky right anterolateral extraosseous extension of tumor as demonstrated on series 5, image 97. No obvious epidural tumor or evidence of cord compression at that level. These levels appears similar to the 07/04/2018 MRI. Review of the MIP images confirms the above findings. IMPRESSION: 1.  Negative for acute pulmonary embolus. 2. Progression of metastatic disease since the CTs on 02/25/2018. Numerous bilateral pulmonary nodules, new left hilar lymphadenopathy, enlarged liver tumor, and also multifocal osseous metastatic disease (thoracic spine metastases appear similar to the MRI on 07/04/2018). Electronically Signed   By: Genevie Ann M.D.   On: 08/27/2018 16:15    Radiological Exams on Admission: Dg Chest 2 View  Result Date: 08/27/2018 CLINICAL DATA:  62 year old with current history of metastatic colon cancer for which the patient is undergoing chemotherapy, presenting with two-day history of fatigue, generalized weakness and tachycardia. EXAM: CHEST - 2 VIEW COMPARISON:  Chest x-rays 07/14/2018, 12/06/2016 and earlier. CT chest 02/25/2018, 11/26/2017 and earlier. MRI thoracic spine 07/04/2018 is correlated. FINDINGS: Cardiac silhouette normal in size, unchanged. Thoracic aorta mildly atherosclerotic, unchanged. Hilar and mediastinal contours otherwise unremarkable. RIGHT jugular  Port-A-Cath tip in the UPPER RIGHT atrium. Evolving post radiation changes involving the INFERIOR LEFT UPPER LOBE which now has the appearance of linear scarring. Atelectasis involving the RIGHT LOWER LOBE and RIGHT MIDDLE LOBE, with associated mild elevation of the RIGHT hemidiaphragm. No confluent airspace consolidation. Metastasis involving the RIGHT ANTERIOR fourth rib with a mixed lytic and sclerotic appearance, overlying the RIGHT mid lung and RIGHT lung base on the PA image. Mixed lytic and sclerotic metastases involving the mid and UPPER thoracic spine as noted on the recent MRI. IMPRESSION: 1. Mild atelectasis involving the RIGHT LOWER LOBE and RIGHT MIDDLE LOBE. No acute cardiopulmonary disease otherwise. 2. Involving post radiation changes involving the INFERIOR LEFT UPPER LOBE which now has the appearance of linear scarring. 3. Osseous metastatic disease involving the RIGHT ANTERIOR fourth rib and multiple mid and UPPER thoracic vertebrae. Electronically Signed   By: Evangeline Dakin M.D.   On: 08/27/2018 13:10   Ct Angio Chest Pe W Or Wo Contrast  Result Date: 08/27/2018 CLINICAL DATA:  62 year old male with metastatic colon cancer. Shortness of breath for 2 days. EXAM: CT ANGIOGRAPHY CHEST WITH CONTRAST TECHNIQUE: Multidetector CT imaging of the chest was performed using the standard protocol during bolus administration of intravenous contrast. Multiplanar CT image reconstructions and MIPs were obtained to evaluate the vascular anatomy. CONTRAST:  164m ISOVUE-370 IOPAMIDOL (ISOVUE-370) INJECTION 76% COMPARISON:  Restaging CT chest abdomen and pelvis 02/25/2018 and earlier. Thoracic and lumbar spine MRI 07/04/2018. FINDINGS: Cardiovascular: Good contrast bolus timing in the pulmonary arterial tree. Respiratory motion in the lower lobes. No focal filling defect identified in the pulmonary arteries to suggest acute pulmonary embolism. Calcified aortic atherosclerosis. Calcified coronary artery  atherosclerosis and/or stent. Right chest porta cath is new since March. No cardiomegaly or pericardial effusion. Mediastinum/Nodes: No mediastinal lymphadenopathy, but there is nonspecific increasing left hilar soft tissue as seen on series 5, image 147, measuring up to 11 millimeters in thickness. The right hilum appears stable and negative. Negative thoracic inlet. Lungs/Pleura: Lower lung volumes.  The major airways are patent. Increased size  and number of numerous pulmonary nodules throughout both lungs. Nodules range from punctate to 8-9 millimeters diameter. Superimposed right greater than left costophrenic angle atelectasis. Trace right pleural effusion. Upper Abdomen: Hyperenhancing liver metastases demonstrated on 02/25/2018 are less apparent today, but likely related to contrast timing as with narrow liver windows a large anterior right liver mass now measures at least 6 centimeters diameter (approximately 3 centimeters previously). Negative visible spleen, pancreas, adrenal glands, kidneys and bowel in the upper abdomen. Musculoskeletal: New sclerotic thoracic vertebral lesions at T1 and T4 through T6 levels are compatible with osseous metastatic disease. Confluent vertebral body involvement T4 through T6 is associated with mild pathologic fracture of T5 and bulky right anterolateral extraosseous extension of tumor as demonstrated on series 5, image 97. No obvious epidural tumor or evidence of cord compression at that level. These levels appears similar to the 07/04/2018 MRI. Review of the MIP images confirms the above findings. IMPRESSION: 1.  Negative for acute pulmonary embolus. 2. Progression of metastatic disease since the CTs on 02/25/2018. Numerous bilateral pulmonary nodules, new left hilar lymphadenopathy, enlarged liver tumor, and also multifocal osseous metastatic disease (thoracic spine metastases appear similar to the MRI on 07/04/2018). Electronically Signed   By: Genevie Ann M.D.   On:  08/27/2018 16:15    DVT Prophylaxis -SCD /heparin AM Labs Ordered, also please review Full Orders  Family Communication: Admission, patients condition and plan of care including tests being ordered have been discussed with the patient and wife and son at bedside who indicate understanding and agree with the plan   Code Status - Full Code  Likely DC to  home  Condition   stable  Roxan Hockey M.D on 08/28/2018 at 9:45 PM Pager---[phone] Go to [email or web address] - password [email or web address] for contact info  [facility] - Office  [phone]

## 2018-08-28 NOTE — Progress Notes (Signed)
Pt delayed coming to floor due to charge nurse needing to discuss with MD about pt, per MEWS we would be transferring pt to the ICU once on the floor

## 2018-08-28 NOTE — ED Notes (Signed)
Report given to Madaline Savage, RN  ED TO INPATIENT HANDOFF REPORT  Name/Age/Gender Allen Glenn 62 y.o. male  Code Status Code Status History    Date Active Date Inactive Code Status Order ID Comments User Context   07/14/2018 1641 07/18/2018 1712 Full Code [id number]  Kerney Elbe, DO Inpatient   06/04/2018 1309 06/06/2018 1353 Full Code [id number]  Erline Levine, MD Inpatient   05/20/2018 0119 05/26/2018 1824 Full Code [id number]  Rise Patience, MD Inpatient   11/28/2016 1451 11/29/2016 1138 Full Code [id number]  Nani Skillern, PA-C Inpatient   04/09/2015 1527 04/10/2015 2320 Full Code [id number]  Nita Sells, MD Inpatient    Advance Directive Documentation     Most Recent Value  Type of Advance Directive  Healthcare Power of Attorney  Pre-existing out of facility DNR order (yellow form or pink MOST form)  -  "MOST" Form in Place?  -      Home/SNF/Other Home  Chief Complaint FEVER 102 ( CHEMO PR ) HEART RATE 130  Level of Care/Admitting Diagnosis ED Disposition    ED Disposition Condition [place]: [facility] [place] [100102]  Level of Care: Stepdown [14]  Admit to SDU based on following criteria: Hemodynamic compromise or significant risk of instability:  Patient requiring short term acute titration and management of vasoactive drips, and invasive monitoring (i.e., CVP and Arterial line).  Diagnosis: Fever [644034]  Admitting Physician: Morrison Old  Attending Physician: Morrison Old  Estimated length of stay: 3 - 4 days  Certification:: I certify this patient will need inpatient services for at least 2 midnights  Bed request comments: step down  PT Class (Do Not Modify): Inpatient [101]  PT Acc Code (Do Not Modify): Private [1]       Medical History Past Medical History:  Diagnosis Date  . Acute MI ([place]) 2014   acute ST elevation MI  . Anemia   . Colon cancer ([place]) 2016   Status post  resection of colon mass is well as he panic metastasis.   . Coronary artery disease   . Diabetes mellitus without complication ([place])   . Enlarged prostate    slightly and takes Flomax daily  . Heart disease   . History of blood transfusion   . Hyperlipidemia    takes Lipitor daily  . Hypertension    takes Lisinopril daily  . Lung nodule    left  . Nocturia   . Numbness    left foot  . Pancreatitis   . Peripheral neuropathy     Allergies No Known Allergies  IV Location/Drains/Wounds Patient Lines/Drains/Airways Status   Active Line/Drains/Airways    Name:   Placement date:   Placement time:   Site:   Days:   Implanted Port 07/18/18 Right Chest   07/18/18    0900    Chest   41   Incision (Closed) 06/04/18 Head Left   06/04/18    1145     85          Labs/Imaging Results for orders placed or performed during the hospital encounter of 08/28/18 (from the past 48 hour(s))  Comprehensive metabolic panel     Status: Abnormal   Collection Time: 08/28/18  8:23 AM  Result Value Ref Range   Sodium 131 (L) 135 - 145 mmol/L   Potassium 4.6 3.5 - 5.1 mmol/L   Chloride 97 (L) 98 - 111 mmol/L   CO2 24 22 - 32  mmol/L   Glucose, Bld 261 (H) 70 - 99 mg/dL   BUN 15 8 - 23 mg/dL   Creatinine, Ser 0.45 (L) 0.61 - 1.24 mg/dL   Calcium 7.6 (L) 8.9 - 10.3 mg/dL   Total Protein 5.4 (L) 6.5 - 8.1 g/dL   Albumin 2.3 (L) 3.5 - 5.0 g/dL   AST 29 15 - 41 U/L   ALT 42 0 - 44 U/L   Alkaline Phosphatase 315 (H) 38 - 126 U/L   Total Bilirubin 0.5 0.3 - 1.2 mg/dL   GFR calc non Af Amer >60 >60 mL/min   GFR calc Af Amer >60 >60 mL/min    Comment: (NOTE) The eGFR has been calculated using the CKD EPI equation. This calculation has not been validated in all clinical situations. eGFR's persistently <60 mL/min signify possible Chronic Kidney Disease.    Anion gap 10 5 - 15    Comment: Performed at [facility], [place] [address], [place], [place] [address]  CBC with Differential      Status: Abnormal   Collection Time: 08/28/18  8:23 AM  Result Value Ref Range   WBC 2.4 (L) 4.0 - 10.5 K/uL   RBC 3.07 (L) 4.22 - 5.81 MIL/uL   Hemoglobin 9.5 (L) 13.0 - 17.0 g/dL   HCT 28.3 (L) 39.0 - 52.0 %   MCV 92.2 78.0 - 100.0 fL   MCH 30.9 26.0 - 34.0 pg   MCHC 33.6 30.0 - 36.0 g/dL   RDW 18.3 (H) 11.5 - 15.5 %   Platelets 67 (L) 150 - 400 K/uL    Comment: REPEATED TO VERIFY SPECIMEN CHECKED FOR CLOTS PLATELET COUNT CONFIRMED BY SMEAR    Neutrophils Relative % 90 %   Neutro Abs 2.2 1.7 - 7.7 K/uL   Lymphocytes Relative 7 %   Lymphs Abs 0.2 (L) 0.7 - 4.0 K/uL   Monocytes Relative 3 %   Monocytes Absolute 0.1 0.1 - 1.0 K/uL   Eosinophils Relative 0 %   Eosinophils Absolute 0.0 0.0 - 0.7 K/uL   Basophils Relative 0 %   Basophils Absolute 0.0 0.0 - 0.1 K/uL    Comment: Performed at [facility], [place] [address] [place], [place] [address]  Magnesium     Status: None   Collection Time: 08/28/18  8:23 AM  Result Value Ref Range   Magnesium 1.8 1.7 - 2.4 mg/dL    Comment: Performed at [facility], [place] [address] [place], [place] [address]  I-Stat CG4 Lactic Acid, ED     Status: Abnormal   Collection Time: 08/28/18  8:42 AM  Result Value Ref Range   Lactic Acid, Venous 4.08 (HH) 0.5 - 1.9 mmol/L   Comment NOTIFIED PHYSICIAN   Urinalysis, Routine w reflex microscopic     Status: Abnormal   Collection Time: 08/28/18 10:51 AM  Result Value Ref Range   Color, Urine YELLOW YELLOW   APPearance CLEAR CLEAR   Specific Gravity, Urine 1.015 1.005 - 1.030   pH 6.0 5.0 - 8.0   Glucose, UA 150 (A) NEGATIVE mg/dL   Hgb urine dipstick NEGATIVE NEGATIVE   Bilirubin Urine NEGATIVE NEGATIVE   Ketones, ur NEGATIVE NEGATIVE mg/dL   Protein, ur NEGATIVE NEGATIVE mg/dL   Nitrite NEGATIVE NEGATIVE   Leukocytes, UA NEGATIVE NEGATIVE    Comment: Performed at [place] [address] [place], [place] [address]  I-Stat CG4  Lactic Acid, ED     Status: Abnormal  Collection Time: 08/28/18 11:43 AM  Result Value Ref Range   Lactic Acid, Venous 2.02 (HH) 0.5 - 1.9 mmol/L   Comment NOTIFIED PHYSICIAN    Dg Chest 2 View  Result Date: 08/27/2018 CLINICAL DATA:  62 year old with current history of metastatic colon cancer for which the patient is undergoing chemotherapy, presenting with two-day history of fatigue, generalized weakness and tachycardia. EXAM: CHEST - 2 VIEW COMPARISON:  Chest x-rays 07/14/2018, 12/06/2016 and earlier. CT chest 02/25/2018, 11/26/2017 and earlier. MRI thoracic spine 07/04/2018 is correlated. FINDINGS: Cardiac silhouette normal in size, unchanged. Thoracic aorta mildly atherosclerotic, unchanged. Hilar and mediastinal contours otherwise unremarkable. RIGHT jugular Port-A-Cath tip in the UPPER RIGHT atrium. Evolving post radiation changes involving the INFERIOR LEFT UPPER LOBE which now has the appearance of linear scarring. Atelectasis involving the RIGHT LOWER LOBE and RIGHT MIDDLE LOBE, with associated mild elevation of the RIGHT hemidiaphragm. No confluent airspace consolidation. Metastasis involving the RIGHT ANTERIOR fourth rib with a mixed lytic and sclerotic appearance, overlying the RIGHT mid lung and RIGHT lung base on the PA image. Mixed lytic and sclerotic metastases involving the mid and UPPER thoracic spine as noted on the recent MRI. IMPRESSION: 1. Mild atelectasis involving the RIGHT LOWER LOBE and RIGHT MIDDLE LOBE. No acute cardiopulmonary disease otherwise. 2. Involving post radiation changes involving the INFERIOR LEFT UPPER LOBE which now has the appearance of linear scarring. 3. Osseous metastatic disease involving the RIGHT ANTERIOR fourth rib and multiple mid and UPPER thoracic vertebrae. Electronically Signed   By: Evangeline Dakin M.D.   On: 08/27/2018 13:10   Ct Angio Chest Pe W Or Wo Contrast  Result Date: 08/27/2018 CLINICAL DATA:  62 year old male with metastatic colon cancer.  Shortness of breath for 2 days. EXAM: CT ANGIOGRAPHY CHEST WITH CONTRAST TECHNIQUE: Multidetector CT imaging of the chest was performed using the standard protocol during bolus administration of intravenous contrast. Multiplanar CT image reconstructions and MIPs were obtained to evaluate the vascular anatomy. CONTRAST:  171m ISOVUE-370 IOPAMIDOL (ISOVUE-370) INJECTION 76% COMPARISON:  Restaging CT chest abdomen and pelvis 02/25/2018 and earlier. Thoracic and lumbar spine MRI 07/04/2018. FINDINGS: Cardiovascular: Good contrast bolus timing in the pulmonary arterial tree. Respiratory motion in the lower lobes. No focal filling defect identified in the pulmonary arteries to suggest acute pulmonary embolism. Calcified aortic atherosclerosis. Calcified coronary artery atherosclerosis and/or stent. Right chest porta cath is new since March. No cardiomegaly or pericardial effusion. Mediastinum/Nodes: No mediastinal lymphadenopathy, but there is nonspecific increasing left hilar soft tissue as seen on series 5, image 147, measuring up to 11 millimeters in thickness. The right hilum appears stable and negative. Negative thoracic inlet. Lungs/Pleura: Lower lung volumes.  The major airways are patent. Increased size and number of numerous pulmonary nodules throughout both lungs. Nodules range from punctate to 8-9 millimeters diameter. Superimposed right greater than left costophrenic angle atelectasis. Trace right pleural effusion. Upper Abdomen: Hyperenhancing liver metastases demonstrated on 02/25/2018 are less apparent today, but likely related to contrast timing as with narrow liver windows a large anterior right liver mass now measures at least 6 centimeters diameter (approximately 3 centimeters previously). Negative visible spleen, pancreas, adrenal glands, kidneys and bowel in the upper abdomen. Musculoskeletal: New sclerotic thoracic vertebral lesions at T1 and T4 through T6 levels are compatible with osseous  metastatic disease. Confluent vertebral body involvement T4 through T6 is associated with mild pathologic fracture of T5 and bulky right anterolateral extraosseous extension of tumor as demonstrated on series 5, image 97. No obvious epidural  tumor or evidence of cord compression at that level. These levels appears similar to the 07/04/2018 MRI. Review of the MIP images confirms the above findings. IMPRESSION: 1.  Negative for acute pulmonary embolus. 2. Progression of metastatic disease since the CTs on 02/25/2018. Numerous bilateral pulmonary nodules, new left hilar lymphadenopathy, enlarged liver tumor, and also multifocal osseous metastatic disease (thoracic spine metastases appear similar to the MRI on 07/04/2018). Electronically Signed   By: Genevie Ann M.D.   On: 08/27/2018 16:15    Pending Labs Unresulted Labs (From admission, onward)    Start     Ordered   08/29/18 0500  CBC with Differential/Platelet  Tomorrow morning,   R     08/28/18 1026   Signed and Held  Basic metabolic panel  Tomorrow morning,   R     Signed and Held          Vitals/Pain Today's Vitals   08/28/18 1030 08/28/18 1051 08/28/18 1100 08/28/18 1200  BP: (!) 108/49  130/63 (!) 143/83  Pulse: (!) 121  (!) 124 (!) 125  Resp: (!) 25  (!) 22 16  Temp:  99.6 F (37.6 C)    TempSrc:  Oral    SpO2: 94%  94% 91%  PainSc:        Isolation Precautions No active isolations  Medications Medications  0.9 %  sodium chloride infusion (has no administration in time range)  insulin aspart (novoLOG) injection 0-9 Units (has no administration in time range)  insulin aspart (novoLOG) injection 0-5 Units (has no administration in time range)  morphine 2 MG/ML injection 2 mg (has no administration in time range)  sodium chloride 0.9 % bolus 1,000 mL (has no administration in time range)  sodium chloride 0.9 % bolus 1,000 mL (has no administration in time range)  sodium chloride 0.9 % bolus 1,000 mL (0 mLs Intravenous Stopped  08/28/18 0957)  acetaminophen (TYLENOL) tablet 1,000 mg (1,000 mg Oral Given 08/28/18 0908)  vancomycin (VANCOCIN) 1,500 mg in sodium chloride 0.9 % 500 mL IVPB (0 mg Intravenous Stopped 08/28/18 1218)  piperacillin-tazobactam (ZOSYN) IVPB 3.375 g (0 g Intravenous Stopped 08/28/18 0935)  sodium chloride 0.9 % bolus 1,500 mL (1,500 mLs Intravenous New Bag/Given 08/28/18 1036)    Mobility manual wheelchair

## 2018-08-28 NOTE — ED Triage Notes (Signed)
Pt wife states the pt had a fever of 102, elevated heart rate and low O2 saturation this morning. Pt had chemotherapy last week. Pt went to cancer center yesterday for elevated heart rate, had blood cultures taken and chest CT.

## 2018-08-28 NOTE — ED Notes (Signed)
Blood noted in stool.

## 2018-08-28 NOTE — ED Provider Notes (Signed)
[facility] DEPT Provider Note   CSN: [id number] Arrival date & time: 08/28/18  0748     History   Chief Complaint Chief Complaint  Patient presents with  . Fever  . Tachycardia    HPI LARANCE RATLEDGE is a 62 y.o. male.  HPI   62 year old male with past history of metastatic colon cancer recently started on chemo resenting with fatigue, Reiger's and generalized fatigue.  He denies any cough he does feel short of breath.  No oxygen saturations noted.  Went to the cancer center because of his elevated heart rate and Blood culture was drawn.  He has had a CT yesterday which showed metastatic disease but no evidence of PE.  Feels generally weak.  No appetite.  Past Medical History:  Diagnosis Date  . Acute MI ([place]) 2014   acute ST elevation MI  . Anemia   . Colon cancer ([place] ) 2016   Status post resection of colon mass is well as he panic metastasis.   . Coronary artery disease   . Diabetes mellitus without complication ([place])   . Enlarged prostate    slightly and takes Flomax daily  . Heart disease   . History of blood transfusion   . Hyperlipidemia    takes Lipitor daily  . Hypertension    takes Lisinopril daily  . Lung nodule    left  . Nocturia   . Numbness    left foot  . Pancreatitis   . Peripheral neuropathy     Patient Active Problem List   Diagnosis Date Noted  . Bone metastasis ([place]) 08/14/2018  . Pancytopenia ([place]) 07/17/2018  . Decreased oral intake   . Dehydration 07/14/2018  . Generalized weakness 07/14/2018  . Cancer related pain   . Palliative care by specialist   . DNR (do not resuscitate) discussion 06/11/2018  . Insulin dependent type 2 diabetes mellitus, uncontrolled ([place]) 06/05/2018  . Coronary artery disease 06/05/2018  . Hyperlipidemia 06/05/2018  . 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 Hypertension 06/05/2018  . Anemia 06/05/2018  . Metastasis to brain ([place]) 06/04/2018  . Brain  metastases ([place]) 06/02/2018  . Abnormal CT of the head   . Elevated alkaline phosphatase level   . Other chronic pancreatitis ([place]) 05/20/2018  . Metastatic colon cancer to liver ([place]) 05/20/2018  . Pulmonary metastasis ([place]) 12/20/2016  . Pulmonary nodule 11/28/2016  . Colon cancer s/p right colectomy  06/13/2016  . Combined hyperlipidemia 06/13/2016  . Chronic diastolic heart failure ([place]) 06/13/2016  . HTN (hypertension) 05/03/2015  . Symptomatic anemia 04/09/2015  . Iron deficiency anemia 04/09/2015  . CAD s/p PCI LAD 06/2013 [facility] 04/09/2015  . Nausea & vomiting 04/09/2015  . Snores--However on Lyrica/Ambien 04/09/2015  . Lumbar radiculopathy 03/07/2015  . Hereditary and idiopathic peripheral neuropathy 03/07/2015    Past Surgical History:  Procedure Laterality Date  . 1/8 of liver removed    . APPENDECTOMY    . APPLICATION OF CRANIAL NAVIGATION Left 06/04/2018   Procedure: APPLICATION OF CRANIAL NAVIGATION;  Surgeon: Erline Levine, MD;  Location: [facility];  Service: Neurosurgery;  Laterality: Left;  . BIOPSY  05/22/2018   Procedure: BIOPSY;  Surgeon: Carol Ada, MD;  Location: [facility];  Service: Endoscopy;;  . BRAIN SURGERY  06/04/2018  . COLONOSCOPY    . CORONARY ANGIOPLASTY     1 stent  . CORONARY STENT PLACEMENT  06-27-2013  . CRANIOTOMY Left 06/04/2018   Procedure: CRANIOTOMY TUMOR EXCISION with  Brainlab;  Surgeon: Erline Levine, MD;  Location: [facility];  Service: Neurosurgery;  Laterality: Left;  CRANIOTOMY TUMOR EXCISION with Brainlab  . ESOPHAGOGASTRODUODENOSCOPY Left 05/22/2018   Procedure: ESOPHAGOGASTRODUODENOSCOPY (EGD);  Surgeon: Carol Ada, MD;  Location: [facility];  Service: Endoscopy;  Laterality: Left;  . HERNIA REPAIR Left 1991  . IR IMAGING GUIDED PORT INSERTION  07/18/2018  . LAPAROSCOPIC RIGHT COLECTOMY  2016   [facility]  . LIVER LOBECTOMY Right 09/28/2015    [facility]  . LUNG BIOPSY Left 11/28/2016   Procedure: LUNG  BIOPSY, left upper lobe;  Surgeon: [name], MD;  Location: [facility];  Service: Thoracic;  Laterality: Left;  Marland Kitchen VIDEO BRONCHOSCOPY WITH ENDOBRONCHIAL NAVIGATION N/A 11/28/2016   Procedure: VIDEO BRONCHOSCOPY WITH ENDOBRONCHIAL NAVIGATION;  Surgeon: [name], MD;  Location: [facility];  Service: Thoracic;  Laterality: N/A;  . VIDEO BRONCHOSCOPY WITH ENDOBRONCHIAL ULTRASOUND N/A 11/28/2016   Procedure: VIDEO BRONCHOSCOPY WITH ENDOBRONCHIAL ULTRASOUND;  Surgeon: [name], MD;  Location: [facility];  Service: Thoracic;  Laterality: N/A;        Home Medications    Prior to Admission medications   Medication Sig Start Date End Date Taking? Authorizing Provider  acetaminophen (TYLENOL) 500 MG tablet Take 500 mg by mouth every 6 (six) hours as needed (FOR PAIN/HEADACHES.).     [provider]  atorvastatin (LIPITOR) 10 MG tablet Take 10 mg by mouth daily.     [provider]  clotrimazole (MYCELEX) 10 MG troche TAKE 1 TABLET (10 MG TOTAL) BY MOUTH 4 TIMES A DAY. TAKE WHILE ON DECADRON 08/20/18   Ladell Pier, MD  Coenzyme Q10 (COQ10) 200 MG CAPS Take 200 mg by mouth daily.    [provider]  Continuous Blood Gluc Sensor (FREESTYLE LIBRE 14 DAY SENSOR) MISC 1 application by Does not apply route every 14 (fourteen) days. 06/06/18   Mikhail, Velta Addison, DO  dexamethasone (DECADRON) 2 MG tablet Take 1 tablet (2 mg total) by mouth daily. 08/21/18   Ladell Pier, MD  diphenoxylate-atropine (LOMOTIL) 2.5-0.025 MG tablet Take 2 tablets by mouth 4 (four) times daily as needed for diarrhea or loose stools. Patient not taking: Reported on 08/27/2018 08/12/18   Owens Shark, NP  dronabinol (MARINOL) 2.5 MG capsule Take 1 capsule (2.5 mg total) by mouth 2 (two) times daily before lunch and supper. Patient not taking: Reported on 08/19/2018 07/18/18   Oretha Milch D, MD  gabapentin (NEURONTIN) 300 MG capsule Take 1 capsule (300 mg total) by mouth 3 (three) times daily. 1 to  2 PO QHS 06/25/18   Hayden Pedro, PA-C  HUMALOG KWIKPEN 100 UNIT/ML KiwkPen Inject 2-15 Units into the skin See admin instructions. 121-150=2 UNITS, 151-200=3 UNITS, 201-250=5 UNITS, 251-300=8 UNITS, 301-350=11 UNITS, 351-400=15 UNITS 05/26/18   [provider]  insulin glargine (LANTUS) 100 UNIT/ML injection Inject 35 Units into the skin at bedtime.    [provider]  Insulin Pen Needle 31G X 5 MM MISC Use with insulin pen. 05/26/18   Mikhail, Velta Addison, DO  lidocaine-prilocaine (EMLA) cream Apply 1 application topically as needed. 07/18/18   Ladell Pier, MD  lisinopril (PRINIVIL,ZESTRIL) 2.5 MG tablet Take 2.5 mg by mouth daily. 02/16/15   [provider]  LORazepam (ATIVAN) 0.5 MG tablet Take 1 tablet (0.5 mg total) by mouth every 8 (eight) hours as needed for anxiety (prn nausea). Take 1 tablet by mouth before bedtime as needed. 08/19/18   Betsy Coder  B, MD  magic mouthwash SOLN Take 5 mLs by mouth 4 (four) times daily as needed for mouth pain. 07/28/18   Tanner, Lyndon Code., PA-C  methocarbamol (ROBAXIN) 500 MG tablet Take 500 mg by mouth every 8 (eight) hours as needed. 07/08/18   [provider]  metoCLOPramide (REGLAN) 10 MG tablet Take 1 tablet (10 mg total) by mouth 3 (three) times daily before meals. 07/28/18   Tanner, Lyndon Code., PA-C  mirtazapine (REMERON) 7.5 MG tablet Take 1 tablet (7.5 mg total) by mouth at bedtime. 08/13/18   Ladell Pier, MD  morphine (MS CONTIN) 15 MG 12 hr tablet TAKE 1 TABLET (15 MG TOTAL) BY MOUTH EVERY 12 HOURS. Patient not taking: Reported on 07/28/2018 07/17/18   Hayden Pedro, PA-C  morphine (MS CONTIN) 30 MG 12 hr tablet Take 1 tablet (30 mg total) by mouth 3 (three) times daily. Patient not taking: Reported on 07/28/2018 07/02/18   Ladell Pier, MD  morphine (MSIR) 15 MG tablet Take 1 tablet (15 mg total) by mouth every 4 (four) hours as needed for severe pain. Patient not taking: Reported on 07/28/2018 07/02/18    Ladell Pier, MD  nitroGLYCERIN (NITROSTAT) 0.4 MG SL tablet Place 0.4 mg under the tongue every 5 (five) minutes as needed for chest pain. CALL 911 IF PAIN PERSISTS 11/24/14   [provider]  omeprazole (PRILOSEC) 20 MG capsule Take 20 mg by mouth daily.    [provider]  ondansetron (ZOFRAN-ODT) 4 MG disintegrating tablet Take 1 tablet (4 mg total) by mouth every 8 (eight) hours as needed for nausea or vomiting. 07/18/18   Desiree Hane, MD  prochlorperazine (COMPAZINE) 5 MG tablet Take 1 tablet (5 mg total) by mouth every 6 (six) hours as needed for nausea or vomiting. Patient not taking: Reported on 08/27/2018 08/19/18   Ladell Pier, MD  promethazine (PHENERGAN) 12.5 MG tablet Take 1 tablet (12.5 mg total) by mouth every 6 (six) hours as needed for nausea or vomiting. Patient not taking: Reported on 08/27/2018 07/24/18   Ladell Pier, MD  tamsulosin [facility]) 0.4 MG CAPS capsule Take 0.4 mg by mouth daily after breakfast. 30 minutes after breakfast 02/24/15   [provider]  trolamine salicylate (ASPERCREME) 10 % cream Apply 1 application topically 2 (two) times daily as needed (FOR BACK PAIN.).    [provider]    Family History Family History  Problem Relation Age of Onset  . Hypertension Mother   . Heart disease Mother   . Hypertension Father   . Heart disease Father   . Neuropathy Neg Hx     Social History Social History   Tobacco Use  . Smoking status: Never Smoker  . Smokeless tobacco: Never Used  Substance Use Topics  . Alcohol use: No    Alcohol/week: 0.0 standard drinks  . Drug use: No     Allergies   Patient has no known allergies.   Review of Systems Review of Systems  All systems reviewed and negative, other than as noted in HPI.  Physical Exam Updated Vital Signs BP (!) 157/75 (BP Location: Left Arm)   Pulse (!) 134   Temp 98.5 F (36.9 C) (Oral) Comment: pt states he drank water prior  Resp 18   SpO2  96%   Physical Exam  Constitutional: He appears well-developed and well-nourished. No distress.  HENT:  Head: Normocephalic and atraumatic.  Eyes: Conjunctivae are normal. Right eye exhibits no discharge.  Left eye exhibits no discharge.  Neck: Neck supple.  Cardiovascular: Regular rhythm and normal heart sounds. Exam reveals no gallop and no friction rub.  No murmur heard. tachycardia  Pulmonary/Chest: Effort normal and breath sounds normal. No respiratory distress.  Abdominal: Soft. He exhibits no distension. There is no tenderness.  Musculoskeletal: He exhibits edema. He exhibits no tenderness.  Neurological: He is alert.  Skin: Skin is warm and dry.  Psychiatric: He has a normal mood and affect. His behavior is normal. Thought content normal.  Nursing note and vitals reviewed.    ED Treatments / Results  Labs (all labs ordered are listed, but only abnormal results are displayed) Labs Reviewed  COMPREHENSIVE METABOLIC PANEL - Abnormal; Notable for the following components:      Result Value   Sodium 131 (*)    Chloride 97 (*)    Glucose, Bld 261 (*)    Creatinine, Ser 0.45 (*)    Calcium 7.6 (*)    Total Protein 5.4 (*)    Albumin 2.3 (*)    Alkaline Phosphatase 315 (*)    All other components within normal limits  CBC WITH DIFFERENTIAL/PLATELET - Abnormal; Notable for the following components:   WBC 2.4 (*)    RBC 3.07 (*)    Hemoglobin 9.5 (*)    HCT 28.3 (*)    RDW 18.3 (*)    Platelets 67 (*)    Lymphs Abs 0.2 (*)    All other components within normal limits  URINALYSIS, ROUTINE W REFLEX MICROSCOPIC - Abnormal; Notable for the following components:   Glucose, UA 150 (*)    All other components within normal limits  GLUCOSE, CAPILLARY - Abnormal; Notable for the following components:   Glucose-Capillary 210 (*)    All other components within normal limits  GLUCOSE, CAPILLARY - Abnormal; Notable for the following components:   Glucose-Capillary 165 (*)    All  other components within normal limits  BASIC METABOLIC PANEL - Abnormal; Notable for the following components:   Sodium 134 (*)    Glucose, Bld 205 (*)    BUN 7 (*)    Creatinine, Ser 0.32 (*)    Calcium 7.4 (*)    All other components within normal limits  CBC WITH DIFFERENTIAL/PLATELET - Abnormal; Notable for the following components:   WBC 2.0 (*)    RBC 2.53 (*)    Hemoglobin 7.8 (*)    HCT 23.7 (*)    RDW 19.0 (*)    Platelets 49 (*)    Lymphs Abs 0.1 (*)    Monocytes Absolute 0.0 (*)    All other components within normal limits  GLUCOSE, CAPILLARY - Abnormal; Notable for the following components:   Glucose-Capillary 221 (*)    All other components within normal limits  GLUCOSE, CAPILLARY - Abnormal; Notable for the following components:   Glucose-Capillary 287 (*)    All other components within normal limits  GLUCOSE, CAPILLARY - Abnormal; Notable for the following components:   Glucose-Capillary 152 (*)    All other components within normal limits  GLUCOSE, CAPILLARY - Abnormal; Notable for the following components:   Glucose-Capillary 195 (*)    All other components within normal limits  GLUCOSE, CAPILLARY - Abnormal; Notable for the following components:   Glucose-Capillary 290 (*)    All other components within normal limits  CBC WITH DIFFERENTIAL/PLATELET - Abnormal; Notable for the following components:   WBC 1.3 (*)    RBC 2.76 (*)    Hemoglobin  8.6 (*)    HCT 25.3 (*)    RDW 18.3 (*)    Platelets 58 (*)    Neutro Abs 1.2 (*)    Lymphs Abs 0.1 (*)    Monocytes Absolute 0.0 (*)    All other components within normal limits  BASIC METABOLIC PANEL - Abnormal; Notable for the following components:   Sodium 131 (*)    Chloride 96 (*)    Glucose, Bld 187 (*)    Creatinine, Ser 0.41 (*)    Calcium 7.7 (*)    All other components within normal limits  ALBUMIN - Abnormal; Notable for the following components:   Albumin 1.9 (*)    All other components within  normal limits  GLUCOSE, CAPILLARY - Abnormal; Notable for the following components:   Glucose-Capillary 313 (*)    All other components within normal limits  GLUCOSE, CAPILLARY - Abnormal; Notable for the following components:   Glucose-Capillary 243 (*)    All other components within normal limits  GLUCOSE, CAPILLARY - Abnormal; Notable for the following components:   Glucose-Capillary 331 (*)    All other components within normal limits  GLUCOSE, CAPILLARY - Abnormal; Notable for the following components:   Glucose-Capillary 387 (*)    All other components within normal limits  CBC WITH DIFFERENTIAL/PLATELET - Abnormal; Notable for the following components:   WBC 1.7 (*)    RBC 2.73 (*)    Hemoglobin 8.3 (*)    HCT 25.0 (*)    RDW 18.2 (*)    Platelets 78 (*)    Neutro Abs 1.5 (*)    Lymphs Abs 0.1 (*)    All other components within normal limits  COMPREHENSIVE METABOLIC PANEL - Abnormal; Notable for the following components:   Sodium 133 (*)    Glucose, Bld 131 (*)    Creatinine, Ser 0.33 (*)    Calcium 7.9 (*)    Total Protein 5.3 (*)    Albumin 1.9 (*)    Alkaline Phosphatase 249 (*)    All other components within normal limits  GLUCOSE, CAPILLARY - Abnormal; Notable for the following components:   Glucose-Capillary 258 (*)    All other components within normal limits  GLUCOSE, CAPILLARY - Abnormal; Notable for the following components:   Glucose-Capillary 104 (*)    All other components within normal limits  BLOOD GAS, ARTERIAL - Abnormal; Notable for the following components:   pH, Arterial 7.497 (*)    pCO2 arterial 29.2 (*)    pO2, Arterial 58.5 (*)    All other components within normal limits  GLUCOSE, CAPILLARY - Abnormal; Notable for the following components:   Glucose-Capillary 128 (*)    All other components within normal limits  CBC WITH DIFFERENTIAL/PLATELET - Abnormal; Notable for the following components:   WBC 3.0 (*)    RBC 2.87 (*)    Hemoglobin  8.7 (*)    HCT 26.3 (*)    RDW 18.1 (*)    Platelets 93 (*)    nRBC 8 (*)    All other components within normal limits  BASIC METABOLIC PANEL - Abnormal; Notable for the following components:   Creatinine, Ser 0.37 (*)    Calcium 7.9 (*)    All other components within normal limits  GLUCOSE, CAPILLARY - Abnormal; Notable for the following components:   Glucose-Capillary 314 (*)    All other components within normal limits  GLUCOSE, CAPILLARY - Abnormal; Notable for the following components:   Glucose-Capillary 223 (*)  All other components within normal limits  LACTATE DEHYDROGENASE - Abnormal; Notable for the following components:   LDH 344 (*)    All other components within normal limits  GLUCOSE, CAPILLARY - Abnormal; Notable for the following components:   Glucose-Capillary 61 (*)    All other components within normal limits  GLUCOSE, CAPILLARY - Abnormal; Notable for the following components:   Glucose-Capillary 108 (*)    All other components within normal limits  GLUCOSE, CAPILLARY - Abnormal; Notable for the following components:   Glucose-Capillary 122 (*)    All other components within normal limits  CBC WITH DIFFERENTIAL/PLATELET - Abnormal; Notable for the following components:   RBC 2.75 (*)    Hemoglobin 8.4 (*)    HCT 25.1 (*)    RDW 18.2 (*)    Platelets 100 (*)    All other components within normal limits  BASIC METABOLIC PANEL - Abnormal; Notable for the following components:   Sodium 131 (*)    Chloride 95 (*)    Glucose, Bld 146 (*)    Creatinine, Ser 0.37 (*)    Calcium 7.9 (*)    All other components within normal limits  GLUCOSE, CAPILLARY - Abnormal; Notable for the following components:   Glucose-Capillary 235 (*)    All other components within normal limits  GLUCOSE, CAPILLARY - Abnormal; Notable for the following components:   Glucose-Capillary 225 (*)    All other components within normal limits  BLOOD GAS, ARTERIAL - Abnormal; Notable for  the following components:   pH, Arterial 7.463 (*)    pO2, Arterial 56.4 (*)    All other components within normal limits  GLUCOSE, CAPILLARY - Abnormal; Notable for the following components:   Glucose-Capillary 115 (*)    All other components within normal limits  GLUCOSE, CAPILLARY - Abnormal; Notable for the following components:   Glucose-Capillary 241 (*)    All other components within normal limits  GLUCOSE, CAPILLARY - Abnormal; Notable for the following components:   Glucose-Capillary 216 (*)    All other components within normal limits  CBC WITH DIFFERENTIAL/PLATELET - Abnormal; Notable for the following components:   RBC 2.56 (*)    Hemoglobin 7.7 (*)    HCT 23.5 (*)    RDW 18.3 (*)    Platelets 93 (*)    nRBC 1 (*)    Lymphs Abs 0.2 (*)    All other components within normal limits  BASIC METABOLIC PANEL - Abnormal; Notable for the following components:   Sodium 130 (*)    Chloride 94 (*)    Glucose, Bld 294 (*)    Creatinine, Ser 0.40 (*)    Calcium 7.9 (*)    All other components within normal limits  MAGNESIUM - Abnormal; Notable for the following components:   Magnesium 2.6 (*)    All other components within normal limits  GLUCOSE, CAPILLARY - Abnormal; Notable for the following components:   Glucose-Capillary 292 (*)    All other components within normal limits  GLUCOSE, CAPILLARY - Abnormal; Notable for the following components:   Glucose-Capillary 253 (*)    All other components within normal limits  GLUCOSE, CAPILLARY - Abnormal; Notable for the following components:   Glucose-Capillary 237 (*)    All other components within normal limits  GLUCOSE, CAPILLARY - Abnormal; Notable for the following components:   Glucose-Capillary 255 (*)    All other components within normal limits  GLUCOSE, CAPILLARY - Abnormal; Notable for the following components:  Glucose-Capillary 242 (*)    All other components within normal limits  CBC WITH DIFFERENTIAL/PLATELET -  Abnormal; Notable for the following components:   RBC 2.36 (*)    Hemoglobin 7.0 (*)    HCT 21.5 (*)    RDW 18.3 (*)    Platelets 80 (*)    Lymphs Abs 0.2 (*)    All other components within normal limits  BASIC METABOLIC PANEL - Abnormal; Notable for the following components:   Sodium 128 (*)    Chloride 94 (*)    Glucose, Bld 289 (*)    Creatinine, Ser 0.36 (*)    Calcium 7.8 (*)    All other components within normal limits  MAGNESIUM - Abnormal; Notable for the following components:   Magnesium 2.5 (*)    All other components within normal limits  GLUCOSE, CAPILLARY - Abnormal; Notable for the following components:   Glucose-Capillary 315 (*)    All other components within normal limits  GLUCOSE, CAPILLARY - Abnormal; Notable for the following components:   Glucose-Capillary 234 (*)    All other components within normal limits  GLUCOSE, CAPILLARY - Abnormal; Notable for the following components:   Glucose-Capillary 233 (*)    All other components within normal limits  GLUCOSE, CAPILLARY - Abnormal; Notable for the following components:   Glucose-Capillary 207 (*)    All other components within normal limits  GLUCOSE, CAPILLARY - Abnormal; Notable for the following components:   Glucose-Capillary 302 (*)    All other components within normal limits  GLUCOSE, CAPILLARY - Abnormal; Notable for the following components:   Glucose-Capillary 234 (*)    All other components within normal limits  CBC WITH DIFFERENTIAL/PLATELET - Abnormal; Notable for the following components:   RBC 2.54 (*)    Hemoglobin 7.7 (*)    HCT 23.2 (*)    RDW 18.4 (*)    Platelets 78 (*)    Lymphs Abs 0.2 (*)    All other components within normal limits  BASIC METABOLIC PANEL - Abnormal; Notable for the following components:   Sodium 131 (*)    Chloride 96 (*)    Glucose, Bld 271 (*)    Creatinine, Ser 0.41 (*)    Calcium 7.8 (*)    All other components within normal limits  GLUCOSE, CAPILLARY -  Abnormal; Notable for the following components:   Glucose-Capillary 197 (*)    All other components within normal limits  GLUCOSE, CAPILLARY - Abnormal; Notable for the following components:   Glucose-Capillary 273 (*)    All other components within normal limits  GLUCOSE, CAPILLARY - Abnormal; Notable for the following components:   Glucose-Capillary 260 (*)    All other components within normal limits  I-STAT CG4 LACTIC ACID, ED - Abnormal; Notable for the following components:   Lactic Acid, Venous 4.08 (*)    All other components within normal limits  I-STAT CG4 LACTIC ACID, ED - Abnormal; Notable for the following components:   Lactic Acid, Venous 2.02 (*)    All other components within normal limits  CULTURE, BLOOD (ROUTINE X 2)  CULTURE, BLOOD (ROUTINE X 2)  MRSA PCR SCREENING  RESPIRATORY PANEL BY PCR  MAGNESIUM  LACTIC ACID, PLASMA  PROCALCITONIN  STREP PNEUMONIAE URINARY ANTIGEN  LEGIONELLA PNEUMOPHILA SEROGP 1 UR AG  MAGNESIUM  MAGNESIUM  PROCALCITONIN  PROCALCITONIN  MAGNESIUM  LEGIONELLA PNEUMOPHILA SEROGP 1 UR AG  STREP PNEUMONIAE URINARY ANTIGEN  MAGNESIUM  VANCOMYCIN, TROUGH  VANCOMYCIN, PEAK  CBC WITH DIFFERENTIAL/PLATELET  EKG EKG Interpretation  Date/Time:  Thursday August 28 2018 09:00:58 EDT Ventricular Rate:  127 PR Interval:  144 QRS Duration: 86 QT Interval:  310 QTC Calculation: 450 R Axis:   -2 Text Interpretation:  Sinus tachycardia No significant change since last tracing Confirmed by Virgel Manifold [phone]) on 08/28/2018 10:08:39 AM   Radiology Dg Chest 2 View  Result Date: 08/27/2018 CLINICAL DATA:  62 year old with current history of metastatic colon cancer for which the patient is undergoing chemotherapy, presenting with two-day history of fatigue, generalized weakness and tachycardia. EXAM: CHEST - 2 VIEW COMPARISON:  Chest x-rays 07/14/2018, 12/06/2016 and earlier. CT chest 02/25/2018, 11/26/2017 and earlier. MRI thoracic spine  07/04/2018 is correlated. FINDINGS: Cardiac silhouette normal in size, unchanged. Thoracic aorta mildly atherosclerotic, unchanged. Hilar and mediastinal contours otherwise unremarkable. RIGHT jugular Port-A-Cath tip in the UPPER RIGHT atrium. Evolving post radiation changes involving the INFERIOR LEFT UPPER LOBE which now has the appearance of linear scarring. Atelectasis involving the RIGHT LOWER LOBE and RIGHT MIDDLE LOBE, with associated mild elevation of the RIGHT hemidiaphragm. No confluent airspace consolidation. Metastasis involving the RIGHT ANTERIOR fourth rib with a mixed lytic and sclerotic appearance, overlying the RIGHT mid lung and RIGHT lung base on the PA image. Mixed lytic and sclerotic metastases involving the mid and UPPER thoracic spine as noted on the recent MRI. IMPRESSION: 1. Mild atelectasis involving the RIGHT LOWER LOBE and RIGHT MIDDLE LOBE. No acute cardiopulmonary disease otherwise. 2. Involving post radiation changes involving the INFERIOR LEFT UPPER LOBE which now has the appearance of linear scarring. 3. Osseous metastatic disease involving the RIGHT ANTERIOR fourth rib and multiple mid and UPPER thoracic vertebrae. Electronically Signed   By: Evangeline Dakin M.D.   On: 08/27/2018 13:10   Ct Angio Chest Pe W Or Wo Contrast  Result Date: 08/27/2018 CLINICAL DATA:  62 year old male with metastatic colon cancer. Shortness of breath for 2 days. EXAM: CT ANGIOGRAPHY CHEST WITH CONTRAST TECHNIQUE: Multidetector CT imaging of the chest was performed using the standard protocol during bolus administration of intravenous contrast. Multiplanar CT image reconstructions and MIPs were obtained to evaluate the vascular anatomy. CONTRAST:  139mL ISOVUE-370 IOPAMIDOL (ISOVUE-370) INJECTION 76% COMPARISON:  Restaging CT chest abdomen and pelvis 02/25/2018 and earlier. Thoracic and lumbar spine MRI 07/04/2018. FINDINGS: Cardiovascular: Good contrast bolus timing in the pulmonary arterial tree.  Respiratory motion in the lower lobes. No focal filling defect identified in the pulmonary arteries to suggest acute pulmonary embolism. Calcified aortic atherosclerosis. Calcified coronary artery atherosclerosis and/or stent. Right chest porta cath is new since March. No cardiomegaly or pericardial effusion. Mediastinum/Nodes: No mediastinal lymphadenopathy, but there is nonspecific increasing left hilar soft tissue as seen on series 5, image 147, measuring up to 11 millimeters in thickness. The right hilum appears stable and negative. Negative thoracic inlet. Lungs/Pleura: Lower lung volumes.  The major airways are patent. Increased size and number of numerous pulmonary nodules throughout both lungs. Nodules range from punctate to 8-9 millimeters diameter. Superimposed right greater than left costophrenic angle atelectasis. Trace right pleural effusion. Upper Abdomen: Hyperenhancing liver metastases demonstrated on 02/25/2018 are less apparent today, but likely related to contrast timing as with narrow liver windows a large anterior right liver mass now measures at least 6 centimeters diameter (approximately 3 centimeters previously). Negative visible spleen, pancreas, adrenal glands, kidneys and bowel in the upper abdomen. Musculoskeletal: New sclerotic thoracic vertebral lesions at T1 and T4 through T6 levels are compatible with osseous metastatic disease. Confluent vertebral body involvement  T4 through T6 is associated with mild pathologic fracture of T5 and bulky right anterolateral extraosseous extension of tumor as demonstrated on series 5, image 97. No obvious epidural tumor or evidence of cord compression at that level. These levels appears similar to the 07/04/2018 MRI. Review of the MIP images confirms the above findings. IMPRESSION: 1.  Negative for acute pulmonary embolus. 2. Progression of metastatic disease since the CTs on 02/25/2018. Numerous bilateral pulmonary nodules, new left hilar  lymphadenopathy, enlarged liver tumor, and also multifocal osseous metastatic disease (thoracic spine metastases appear similar to the MRI on 07/04/2018). Electronically Signed   By: Genevie Ann M.D.   On: 08/27/2018 16:15    Procedures Procedures (including critical care time)  Medications Ordered in ED Medications  0.9 %  sodium chloride infusion (has no administration in time range)     Initial Impression / Assessment and Plan / ED Course  I have reviewed the triage vital signs and the nursing notes.  Pertinent labs & imaging results that were available during my care of the patient were reviewed by me and considered in my medical decision making (see chart for details).     62yM with worsening fatigue, fever, tachycardia. Likely multifactorial. Mild anemia. Very deconditioned (he needed someone else to physically move his legs for him so he could reposition himself in bed). Possible infectious process with reported fever. CT yesterday showing progression of metastatic disease. From his wife's description, he has been on decadron for a long period of time and has a Cushingoid appearance.  Increasing edema is probably third spacing and likely intravascularly dry.   8:55 AM Temp 102 and lactic acid came back greater than 4. Will empirically treat for sepsis at this point. He had blood cultures drawn yesterday. NGTD. Had CTa yesterday and CXR not likely to show a new process in this short time interval.    Final Clinical Impressions(s) / ED Diagnoses   Final diagnoses:  Acute respiratory failure with hypoxia ([place])  Metastatic cancer [facility])    ED Discharge Orders    None       Virgel Manifold, MD 09/05/18 1503

## 2018-08-28 NOTE — Progress Notes (Signed)
Dr Denton Brick notified of elevated HR and RR. Received new orders. MD at bedside, updating wife. Will continue to monitor pt.

## 2018-08-29 ENCOUNTER — Inpatient Hospital Stay (HOSPITAL_COMMUNITY): Payer: 59

## 2018-08-29 DIAGNOSIS — J13 Pneumonia due to Streptococcus pneumoniae: Secondary | ICD-10-CM

## 2018-08-29 DIAGNOSIS — E119 Type 2 diabetes mellitus without complications: Secondary | ICD-10-CM

## 2018-08-29 DIAGNOSIS — E860 Dehydration: Secondary | ICD-10-CM

## 2018-08-29 DIAGNOSIS — T451X5A Adverse effect of antineoplastic and immunosuppressive drugs, initial encounter: Secondary | ICD-10-CM

## 2018-08-29 DIAGNOSIS — E1165 Type 2 diabetes mellitus with hyperglycemia: Secondary | ICD-10-CM

## 2018-08-29 DIAGNOSIS — I2510 Atherosclerotic heart disease of native coronary artery without angina pectoris: Secondary | ICD-10-CM

## 2018-08-29 DIAGNOSIS — I10 Essential (primary) hypertension: Secondary | ICD-10-CM

## 2018-08-29 DIAGNOSIS — D6481 Anemia due to antineoplastic chemotherapy: Secondary | ICD-10-CM

## 2018-08-29 DIAGNOSIS — E785 Hyperlipidemia, unspecified: Secondary | ICD-10-CM

## 2018-08-29 DIAGNOSIS — R197 Diarrhea, unspecified: Secondary | ICD-10-CM

## 2018-08-29 DIAGNOSIS — B370 Candidal stomatitis: Secondary | ICD-10-CM

## 2018-08-29 DIAGNOSIS — R5081 Fever presenting with conditions classified elsewhere: Secondary | ICD-10-CM

## 2018-08-29 DIAGNOSIS — Z794 Long term (current) use of insulin: Secondary | ICD-10-CM

## 2018-08-29 DIAGNOSIS — R110 Nausea: Secondary | ICD-10-CM

## 2018-08-29 DIAGNOSIS — D701 Agranulocytosis secondary to cancer chemotherapy: Secondary | ICD-10-CM

## 2018-08-29 DIAGNOSIS — I159 Secondary hypertension, unspecified: Secondary | ICD-10-CM

## 2018-08-29 DIAGNOSIS — I252 Old myocardial infarction: Secondary | ICD-10-CM

## 2018-08-29 LAB — BASIC METABOLIC PANEL
ANION GAP: 9 (ref 5–15)
BUN: 7 mg/dL — ABNORMAL LOW (ref 8–23)
CO2: 23 mmol/L (ref 22–32)
Calcium: 7.4 mg/dL — ABNORMAL LOW (ref 8.9–10.3)
Chloride: 102 mmol/L (ref 98–111)
Creatinine, Ser: 0.32 mg/dL — ABNORMAL LOW (ref 0.61–1.24)
GFR calc Af Amer: >60 mL/min (ref >60)
GFR calc non Af Amer: >60 mL/min (ref >60)
GLUCOSE: 205 mg/dL — AB (ref 70–99)
Potassium: 4 mmol/L (ref 3.5–5.1)
Sodium: 134 mmol/L — ABNORMAL LOW (ref 135–145)

## 2018-08-29 LAB — STREP PNEUMONIAE URINARY ANTIGEN: Strep Pneumo Urinary Antigen: NEGATIVE

## 2018-08-29 LAB — CBC WITH DIFFERENTIAL/PLATELET
Basophils Absolute: 0 10*3/uL (ref 0.0–0.1)
Basophils Relative: 0 %
EOS PCT: 0 %
Eosinophils Absolute: 0 10*3/uL (ref 0.0–0.7)
HCT: 23.7 % — ABNORMAL LOW (ref 39.0–52.0)
HEMOGLOBIN: 7.8 g/dL — AB (ref 13.0–17.0)
LYMPHS ABS: 0.1 10*3/uL — AB (ref 0.7–4.0)
LYMPHS PCT: 5 %
MCH: 30.8 pg (ref 26.0–34.0)
MCHC: 32.9 g/dL (ref 30.0–36.0)
MCV: 93.7 fL (ref 78.0–100.0)
Monocytes Absolute: 0 10*3/uL — ABNORMAL LOW (ref 0.1–1.0)
Monocytes Relative: 2 %
Neutro Abs: 1.9 10*3/uL (ref 1.7–7.7)
Neutrophils Relative %: 93 %
PLATELETS: 49 10*3/uL — AB (ref 150–400)
RBC: 2.53 MIL/uL — ABNORMAL LOW (ref 4.22–5.81)
RDW: 19 % — ABNORMAL HIGH (ref 11.5–15.5)
WBC: 2 10*3/uL — ABNORMAL LOW (ref 4.0–10.5)

## 2018-08-29 LAB — GLUCOSE, CAPILLARY
GLUCOSE-CAPILLARY: 152 mg/dL — AB (ref 70–99)
Glucose-Capillary: 195 mg/dL — ABNORMAL HIGH (ref 70–99)
Glucose-Capillary: 290 mg/dL — ABNORMAL HIGH (ref 70–99)
Glucose-Capillary: 313 mg/dL — ABNORMAL HIGH (ref 70–99)

## 2018-08-29 LAB — PROCALCITONIN: PROCALCITONIN: 0.93 ng/mL

## 2018-08-29 MED ORDER — SODIUM CHLORIDE 0.9% FLUSH
10.0000 mL | INTRAVENOUS | Status: DC | PRN
  Administered 2018-09-16: 10 mL
  Filled 2018-08-29: qty 40

## 2018-08-29 MED ORDER — ADULT MULTIVITAMIN W/MINERALS CH
1.0000 | ORAL_TABLET | Freq: Every day | ORAL | Status: DC
  Administered 2018-08-29 – 2018-09-09 (×12): 1 via ORAL
  Filled 2018-08-29 (×12): qty 1

## 2018-08-29 MED ORDER — PREMIER PROTEIN SHAKE
11.0000 [oz_av] | Freq: Three times a day (TID) | ORAL | Status: DC
  Administered 2018-08-29 – 2018-09-22 (×31): 11 [oz_av] via ORAL
  Filled 2018-08-29 (×76): qty 325.31

## 2018-08-29 MED ORDER — LEVOFLOXACIN IN D5W 750 MG/150ML IV SOLN
750.0000 mg | INTRAVENOUS | Status: AC
  Administered 2018-08-29 – 2018-09-10 (×13): 750 mg via INTRAVENOUS
  Filled 2018-08-29 (×13): qty 150

## 2018-08-29 MED ORDER — LEVALBUTEROL HCL 1.25 MG/0.5ML IN NEBU
1.2500 mg | INHALATION_SOLUTION | RESPIRATORY_TRACT | Status: DC | PRN
  Administered 2018-08-30 – 2018-09-21 (×4): 1.25 mg via RESPIRATORY_TRACT
  Filled 2018-08-29 (×4): qty 0.5

## 2018-08-29 MED ORDER — CHLORHEXIDINE GLUCONATE CLOTH 2 % EX PADS
6.0000 | MEDICATED_PAD | Freq: Every day | CUTANEOUS | Status: DC
  Administered 2018-08-29 – 2018-09-22 (×18): 6 via TOPICAL

## 2018-08-29 MED ORDER — FUROSEMIDE 10 MG/ML IJ SOLN
40.0000 mg | Freq: Once | INTRAMUSCULAR | Status: AC
  Administered 2018-08-29: 40 mg via INTRAVENOUS
  Filled 2018-08-29: qty 4

## 2018-08-29 NOTE — Progress Notes (Signed)
PT Cancellation Note  Patient Details Name: Allen Glenn MRN: [id number] DOB: 17-May-1956   Cancelled Treatment:    Reason Eval/Treat Not Completed: Patient declined, no reason specified - Per RN, pt has been standing with nursing today, went to CT, and is currently exhausted. RN states defer PT today, but would like pt to be evaled tomorrow. Will continue to follow up as appropriate.  Julien Girt, PT, DPT  Pager # [phone]     Junella Domke D Ligia Duguay 08/29/2018, 1:08 PM

## 2018-08-29 NOTE — Progress Notes (Signed)
These preliminary result these preliminary results were noted.  Awaiting final report.

## 2018-08-29 NOTE — Care Management Note (Signed)
Case Management Note  Patient Details  Name: Allen Glenn MRN: [id number] Date of Birth: [birth year] Principal Problem:   Sepsis [facility]) Active Problems:   Colon cancer s/p right colectomy    Metastatic colon cancer to liver ([place])   HTN (hypertension)   Insulin dependent type 2 diabetes mellitus, uncontrolled ([place])   Pancytopenia ([place])   Neutropenia with fever ([place])   Fever Subjective/Objective:                  Day 1 Goal Length of Stay: Ambulatory or 3 days  Note: Goal Length of Stay assumes optimal recovery, decision making, and care. Patients may be discharged to a lower level of care (either later than or sooner than the goal) when it is appropriate for their clinical status and care needs. Discharge Readiness Return to top of Sepsis and Other Febrile Illness, without Focal Infection RRG - [place]  Discharge readiness is indicated by patient meeting Recovery Milestones, including ALL of the following: ? Hemodynamic stability ? Fever absent or reduced temp=102 ? Hypoxemia absent on 02 via  ? Mental status at baseline ? End-organ dysfunction BUN=7, Creat. 0.32 ? Metabolic derangement (eg, dehydration, acidosis) absent ? Cultures negative or infection identified and under adequate treatment ? Wbc=2.0, hgb=7.8,na-134 ? Ambulatory=bed rest ? Oral hydration, medications[P] ? Iv ns at 150cc/hrs, iv zosyn and iv vancomycin  Action/Plan: Following for cm needs and progression  Expected Discharge Date:  (unknown)               Expected Discharge Plan:  Home/Self Care  In-House Referral:     Discharge planning Services  CM Consult  Post Acute Care Choice:    Choice offered to:     DME Arranged:    DME Agency:     HH Arranged:    HH Agency:     Status of Service:  In process, will continue to follow  If discussed at Long Length of Stay Meetings, dates discussed:    Additional Comments:  Allen Cha, RN 08/29/2018, 12:20 PM

## 2018-08-29 NOTE — Progress Notes (Signed)
Initial Nutrition Assessment  DOCUMENTATION CODES:   (Will assess for malnutrition at follow-up)  INTERVENTION:  - Will order Premier Protein TID, each supplement provides 160 kcal and 30 grams of protein. - Will order daily multivitamin with minerals. - Continue to encourage PO intakes.    NUTRITION DIAGNOSIS:   Increased nutrient needs related to chronic illness, catabolic illness, cancer and cancer related treatments, acute illness as evidenced by estimated needs.  GOAL:   Patient will meet greater than or equal to 90% of their needs  MONITOR:   PO intake, Supplement acceptance, Weight trends, Labs  REASON FOR ASSESSMENT:   Malnutrition Screening Tool  ASSESSMENT:   62 year-old with PMH of stage 4 colon cancer with liver mets s/p R colectomy and last chemo on 08/19/18, HTN, and Type 2 DM. Patient admitted with dx of sepsis, pancytopenia, and neutropenia with fever.  BMI indicates normal weight. No intakes documented since admission. Patient mostly resting during RD visit and nearly all information provided by wife and a daughter, who are at bedside. Patient was seen by [place] on 8/29 at which time patient reported drinking 2-3 cartons of Premier Protein/day and encouragement was provided to consume 6 small meals/day to increase kcal and protein intake.   Since RD appointment on 8/29 patient's appetite has again decreased. He will often experience a desire for a food item and then take 1-2 bites and not want any more. Wife has been preparing a variety of foods, they go out to eat, or get takeout based on patient's preferences per meal. He has been trying to eat small, frequent meals and has been consuming Premier Protein between meals.   Unable to obtain much other nutrition-related information at this time as family desired to discuss the progress patient has been making concerning PO intakes and mobility prior to acute illness, family support, and the joy brought to  patient by family.   RN had provided Premier Protein to patient shortly before RD visit and patient also requested a cup of chocolate pudding which RD provided.   Patient and family desire to defer NFPE at this time. Per chart review, current weight is 186 lb which is consistent with weight from 6/26-7/26. From 7/26-8/27 weight decreased by wife reports that patient began eating better and focus placed on adequate nutrition.   Medications reviewed; sliding scale Novolog, 22 units Lantus/day.  Labs reviewed; HgbA1c: 7.9%, CBG: 152 mg/dL today, Na: 134 mmol/L, BUN: 7 mg/dL, creatinine: 0.32 mg/dL, Ca: 7.4 mg/dL. IVF; NS @ 150 mL/hr.       NUTRITION - FOCUSED PHYSICAL EXAM:  Will attempt at follow-up.   Diet Order:   Diet Order            Diet regular Room service appropriate? Yes; Fluid consistency: Thin  Diet effective now              EDUCATION NEEDS:   No education needs have been identified at this time  Skin:  Skin Assessment: Reviewed RN Assessment  Last BM:  9/6  Height:   Ht Readings from Last 1 Encounters:  08/28/18 6\' 2"  (1.88 m)    Weight:   Wt Readings from Last 1 Encounters:  08/28/18 84.2 kg    Ideal Body Weight:  86.36 kg  BMI:  Body mass index is 23.83 kg/m.  Estimated Nutritional Needs:   Kcal:  2525-2780 (30-33 kcal/kg)  Protein:  125-140 grams (~1.5-1.7 grams/kg)  Fluid:  >/= 2.5 L/day  Jarome Matin, MS, RD, LDN, [facility] Inpatient Clinical Dietitian Pager # [phone] After hours/weekend pager # [phone]

## 2018-08-29 NOTE — Progress Notes (Signed)
PROGRESS NOTE [facility]   SASCHA BAUGHER   HBZ:[id number] DOB: [birth year]  DOA: 08/28/2018 PCP: [name], MD   Brief Narrative:  Allen Glenn is a 62 year old male with past medical history of diabetes, CAD, hypertension and metastatic colon cancer on chemotherapy 8/27 who presented to the emergency department complaining of fever, fatigue and tachycardia.  Upon ED evaluation patient was found to be hypoxic, elevated lactic acid and febrile.  Patient was admitted with working diagnosis of sepsis of unknown origin and started on empiric antibiotics.  Subjective: Patient seen and examined, he reported difficulty breathing and dyspnea on exertion.  Denies chest pain, cough, nausea and vomiting.  Remains febrile T-max 102.3.  No acute events overnight.  Assessment & Plan: Sepsis due to suspected pneumonia Initial images did not show signs of infection, however patient slightly hydrated.  CTA of the chest did not show PE but shows progression of metastatic lesion to the lungs.  Repeat chest x-ray on 9/6 shows worsening of bilateral airspace opacity concerning for pneumonia versus pulmonary edema. I personally reviewed chest x-ray with is consistent with pneumonia, also some signs of fluid overload. Patient currently on vancomycin, will add Levaquin, patient immunosuppressed on long-term steroid therapy will need to cover for atypicals, doubt PCP.  Will check procalcitonin.  Continue supportive treatment. If remains afebrile for 24 hrs can d/c Vancomycin as MRSA is negative.   Acute respiratory failure with hypoxia Felt to be secondary to pneumonia and fluid overload.  Patient received aggressive IV hydration, will add Lasix 40 mg x 1. Continue oxygen supplementation to keep saturations above 89%.  Treat underlying causes.  Thrombocytopenia In setting of chemotherapy and sepsis. No signs of bleeding. DC heparin SQ, start SCDs.  Continue to monitor platelet count.   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 appreciated recommendations. Patient may need palliative consult. CT shows some progression of lung diseases.   HTN BP stable, on lisinopril continue to monitor   Sinus tachycardia  Likely sepsis physiology response. No need to add BB. Will continue to monitor. If persist despite treatment sepsis tx will obtain ECHO. Continue tele monitoring.   Type 2 DM   Last A1C 7.9. CBG's acceptable, patient on steroids. Lantus has been reduce. Patient with poor appetite. Continue current regimen, adjust insulin pending 24 hrs of insulin requirement.   DVT prophylaxis: SCD's Code Status: Full Code  Family Communication: Family at bedside  Disposition Plan: TBD   Consultants:   Oncology   Procedures:   None   Antimicrobials: Anti-infectives (From admission, onward)   Start     Dose/Rate Route Frequency Ordered Stop   08/28/18 2000  vancomycin (VANCOCIN) 1,250 mg in sodium chloride 0.9 % 250 mL IVPB     1,250 mg 166.7 mL/hr over 90 Minutes Intravenous Every 12 hours 08/28/18 1725     08/28/18 1500  piperacillin-tazobactam (ZOSYN) IVPB 3.375 g     3.375 g 12.5 mL/hr over 240 Minutes Intravenous Every 8 hours 08/28/18 1245     08/28/18 0900  vancomycin (VANCOCIN) 1,500 mg in sodium chloride 0.9 % 500 mL IVPB     1,500 mg 250 mL/hr over 120 Minutes Intravenous  Once 08/28/18 0854 08/28/18 1218   08/28/18 0900  piperacillin-tazobactam (ZOSYN) IVPB 3.375 g     3.375 g 100 mL/hr over 30 Minutes Intravenous  Once 08/28/18 0854 08/28/18 0935       Objective: Vitals:  08/29/18 0354 08/29/18 0400 08/29/18 0600 08/29/18 0800  BP:  (!) 113/55 (!) 111/47   Pulse:  (!) 107 (!) 106   Resp:  (!) 29 (!) 33   Temp: 99.4 F (37.4 C)   99.7 F (37.6 C)  TempSrc: Oral   Oral  SpO2:  95% 91%   Weight:      Height:        Intake/Output Summary (Last 24 hours) at 08/29/2018 0926 Last data  filed at 08/29/2018 1610 Gross per 24 hour  Intake 4073.36 ml  Output 3150 ml  Net 923.36 ml   Filed Weights   08/28/18 1400  Weight: 84.2 kg    Examination:  General exam: NAD HEENT: AC/AT, PERRLA, OP moist and clear Respiratory system: Decrease BS b/l, diffuse rales and mild rhonchi at the lower bases Cardiovascular system: S1 & S2 heard, tachycardia. No JVD, murmurs, rubs or gallops Gastrointestinal system: Abdomen is nondistended, soft and nontender.  Central nervous system: Alert and oriented. No focal neurological deficits. Extremities: Left LE edema 1+, R LE normal  Skin: No rashes Psychiatry:  Mood & affect appropriate.    Data Reviewed: I have personally reviewed following labs and imaging studies  CBC: Recent Labs  Lab 08/27/18 0931 08/28/18 0823 08/29/18 0444  WBC 2.8* 2.4* 2.0*  NEUTROABS 2.5 2.2 1.9  HGB 10.1* 9.5* 7.8*  HCT 30.6* 28.3* 23.7*  MCV 91.9 92.2 93.7  PLT 63* 67* 49*   Basic Metabolic Panel: Recent Labs  Lab 08/27/18 0931 08/28/18 0823 08/29/18 0444  NA 130* 131* 134*  K 4.0 4.6 4.0  CL 98 97* 102  CO2 22 24 23   GLUCOSE 259* 261* 205*  BUN 17 15 7*  CREATININE 0.52* 0.45* 0.32*  CALCIUM 7.8* 7.6* 7.4*  MG  --  1.8  --    GFR: Estimated Creatinine Clearance: 112.7 mL/min (A) (by C-G formula based on SCr of 0.32 mg/dL (L)). Liver Function Tests: Recent Labs  Lab 08/27/18 0931 08/28/18 0823  AST 24 29  ALT 46* 42  ALKPHOS 367* 315*  BILITOT 0.5 0.5  PROT 5.1* 5.4*  ALBUMIN 2.3* 2.3*   No results for input(s): LIPASE, AMYLASE in the last 168 hours. No results for input(s): AMMONIA in the last 168 hours. Coagulation Profile: No results for input(s): INR, PROTIME in the last 168 hours. Cardiac Enzymes: No results for input(s): CKTOTAL, CKMB, CKMBINDEX, TROPONINI in the last 168 hours. BNP (last 3 results) No results for input(s): PROBNP in the last 8760 hours. HbA1C: No results for input(s): HGBA1C in the last 72  hours. CBG: Recent Labs  Lab 08/28/18 1407 08/28/18 1616 08/28/18 1929 08/28/18 2139 08/29/18 0727  GLUCAP 210* 165* 221* 287* 152*   Lipid Profile: No results for input(s): CHOL, HDL, LDLCALC, TRIG, CHOLHDL, LDLDIRECT in the last 72 hours. Thyroid Function Tests: No results for input(s): TSH, T4TOTAL, FREET4, T3FREE, THYROIDAB in the last 72 hours. Anemia Panel: No results for input(s): VITAMINB12, FOLATE, FERRITIN, TIBC, IRON, RETICCTPCT in the last 72 hours. Sepsis Labs: Recent Labs  Lab 08/28/18 9604 08/28/18 1143 08/28/18 1909  LATICACIDVEN 4.08* 2.02* 1.9    Recent Results (from the past 240 hour(s))  Culture, Blood     Status: None (Preliminary result)   Collection Time: 08/27/18  9:51 AM  Result Value Ref Range Status   Specimen Description BLOOD BLOOD RIGHT HAND PORTA CATH  Final   Special Requests   Final    BOTTLES DRAWN AEROBIC AND ANAEROBIC Blood Culture  adequate volume   Culture   Final    NO GROWTH < 24 HOURS Performed at [facility], [place] [address] [place], [place] [address]    Report Status PENDING  Incomplete  Culture, Blood     Status: None (Preliminary result)   Collection Time: 08/27/18 10:10 AM  Result Value Ref Range Status   Specimen Description   Final    BLOOD RIGHT HAND Performed at [facility], [place] [address] [place], [place] [address]    Special Requests   Final    NONE Performed at [facility], [place] [address] [place], [place] [address]    Culture   Final    NO GROWTH 1 DAY Performed at [facility], [place] [address] [place], [place] [address]    Report Status PENDING  Incomplete  MRSA PCR Screening     Status: None   Collection Time: 08/28/18  1:45 PM  Result Value Ref Range Status   MRSA by PCR NEGATIVE NEGATIVE Final    Comment:        The GeneXpert MRSA Assay (FDA approved for NASAL specimens only), is one component of a comprehensive MRSA  colonization surveillance program. It is not intended to diagnose MRSA infection nor to guide or monitor treatment for MRSA infections. Performed at [facility], [place] [address] [place], [place] [address]       Radiology Studies: Dg Chest 2 View  Result Date: 08/27/2018 CLINICAL DATA:  62 year old with current history of metastatic colon cancer for which the patient is undergoing chemotherapy, presenting with two-day history of fatigue, generalized weakness and tachycardia. EXAM: CHEST - 2 VIEW COMPARISON:  Chest x-rays 07/14/2018, 12/06/2016 and earlier. CT chest 02/25/2018, 11/26/2017 and earlier. MRI thoracic spine 07/04/2018 is correlated. FINDINGS: Cardiac silhouette normal in size, unchanged. Thoracic aorta mildly atherosclerotic, unchanged. Hilar and mediastinal contours otherwise unremarkable. RIGHT jugular Port-A-Cath tip in the UPPER RIGHT atrium. Evolving post radiation changes involving the INFERIOR LEFT UPPER LOBE which now has the appearance of linear scarring. Atelectasis involving the RIGHT LOWER LOBE and RIGHT MIDDLE LOBE, with associated mild elevation of the RIGHT hemidiaphragm. No confluent airspace consolidation. Metastasis involving the RIGHT ANTERIOR fourth rib with a mixed lytic and sclerotic appearance, overlying the RIGHT mid lung and RIGHT lung base on the PA image. Mixed lytic and sclerotic metastases involving the mid and UPPER thoracic spine as noted on the recent MRI. IMPRESSION: 1. Mild atelectasis involving the RIGHT LOWER LOBE and RIGHT MIDDLE LOBE. No acute cardiopulmonary disease otherwise. 2. Involving post radiation changes involving the INFERIOR LEFT UPPER LOBE which now has the appearance of linear scarring. 3. Osseous metastatic disease involving the RIGHT ANTERIOR fourth rib and multiple mid and UPPER thoracic vertebrae. Electronically Signed   By: Evangeline Dakin M.D.   On: 08/27/2018 13:10   Ct Angio Chest Pe W Or Wo Contrast  Result  Date: 08/27/2018 CLINICAL DATA:  62 year old male with metastatic colon cancer. Shortness of breath for 2 days. EXAM: CT ANGIOGRAPHY CHEST WITH CONTRAST TECHNIQUE: Multidetector CT imaging of the chest was performed using the standard protocol during bolus administration of intravenous contrast. Multiplanar CT image reconstructions and MIPs were obtained to evaluate the vascular anatomy. CONTRAST:  17mL ISOVUE-370 IOPAMIDOL (ISOVUE-370) INJECTION 76% COMPARISON:  Restaging CT chest abdomen and pelvis 02/25/2018 and earlier. Thoracic and lumbar spine MRI 07/04/2018. FINDINGS: Cardiovascular: Good contrast bolus timing in the pulmonary arterial tree. Respiratory motion in the  lower lobes. No focal filling defect identified in the pulmonary arteries to suggest acute pulmonary embolism. Calcified aortic atherosclerosis. Calcified coronary artery atherosclerosis and/or stent. Right chest porta cath is new since March. No cardiomegaly or pericardial effusion. Mediastinum/Nodes: No mediastinal lymphadenopathy, but there is nonspecific increasing left hilar soft tissue as seen on series 5, image 147, measuring up to 11 millimeters in thickness. The right hilum appears stable and negative. Negative thoracic inlet. Lungs/Pleura: Lower lung volumes.  The major airways are patent. Increased size and number of numerous pulmonary nodules throughout both lungs. Nodules range from punctate to 8-9 millimeters diameter. Superimposed right greater than left costophrenic angle atelectasis. Trace right pleural effusion. Upper Abdomen: Hyperenhancing liver metastases demonstrated on 02/25/2018 are less apparent today, but likely related to contrast timing as with narrow liver windows a large anterior right liver mass now measures at least 6 centimeters diameter (approximately 3 centimeters previously). Negative visible spleen, pancreas, adrenal glands, kidneys and bowel in the upper abdomen. Musculoskeletal: New sclerotic thoracic  vertebral lesions at T1 and T4 through T6 levels are compatible with osseous metastatic disease. Confluent vertebral body involvement T4 through T6 is associated with mild pathologic fracture of T5 and bulky right anterolateral extraosseous extension of tumor as demonstrated on series 5, image 97. No obvious epidural tumor or evidence of cord compression at that level. These levels appears similar to the 07/04/2018 MRI. Review of the MIP images confirms the above findings. IMPRESSION: 1.  Negative for acute pulmonary embolus. 2. Progression of metastatic disease since the CTs on 02/25/2018. Numerous bilateral pulmonary nodules, new left hilar lymphadenopathy, enlarged liver tumor, and also multifocal osseous metastatic disease (thoracic spine metastases appear similar to the MRI on 07/04/2018). Electronically Signed   By: Genevie Ann M.D.   On: 08/27/2018 16:15      Scheduled Meds: . atorvastatin  10 mg Oral q1800  . Chlorhexidine Gluconate Cloth  6 each Topical Daily  . dexamethasone  2 mg Oral Daily  . gabapentin  300 mg Oral TID  . insulin aspart  0-5 Units Subcutaneous QHS  . insulin aspart  0-9 Units Subcutaneous TID WC  . insulin glargine  22 Units Subcutaneous QHS  . lisinopril  2.5 mg Oral Daily  . mouth rinse  15 mL Mouth Rinse BID  . morphine  15 mg Oral Daily  . pantoprazole  40 mg Oral Daily  . sodium chloride flush  3 mL Intravenous Q12H  . tamsulosin  0.4 mg Oral QPC breakfast   Continuous Infusions: . sodium chloride 150 mL/hr at 08/29/18 0828  . sodium chloride    . piperacillin-tazobactam (ZOSYN)  IV 12.5 mL/hr at 08/29/18 0828  . sodium chloride    . sodium chloride    . vancomycin 1,250 mg (08/29/18 0828)     LOS: 1 day    Time spent: Total of 35 minutes spent with pt, greater than 50% of which was spent in discussion of  treatment, counseling and coordination of care   Chipper Oman, MD Pager: Text Page via [email or web address]   If 7PM-7AM, please contact  night-coverage [email or web address] 08/29/2018, 9:26 AM   Note - This record has been created using [employer]. Chart creation errors have been sought, but may not always have been located. Such creation errors do not reflect on the standard of medical care.

## 2018-08-29 NOTE — Progress Notes (Signed)
Pharmacy Antibiotic Note  Allen Glenn is a 62 y.o. male admitted on 08/28/2018 with sepsis, pneumonia.  Pharmacy has been consulted for Vancomycin and Zosyn dosing on 9/5, and adding Levaquin on 9/6.  Today, 08/29/2018: SCr 0.32 remains low WBC decreased to 2, ANC decreased to 1.9 Remains febrile, Tm 102.3, Tc 101.1  Plan:  Levaquin 750 mg IV q24h  Continue Zosyn 3.375g IV Q8H infused over 4hrs.   Continue Vancomycin 1250mg  IV q12h. Check vancomycin levels if remains on vancomycin > 3-4 days.  Goal AUC 400-500. Follow up renal fxn, culture results, and clinical course. F/u ability to de-escalate antibiotics.    Height: 6\' 2"  (188 cm) Weight: 185 lb 10 oz (84.2 kg) IBW/kg (Calculated) : 82.2  Temp (24hrs), Avg:100.2 F (37.9 C), Min:98.1 F (36.7 C), Max:102 F (38.9 C)  Recent Labs  Lab 08/27/18 0931 08/28/18 0823 08/28/18 0842 08/28/18 1143 08/28/18 1909 08/29/18 0444  WBC 2.8* 2.4*  --   --   --  2.0*  CREATININE 0.52* 0.45*  --   --   --  0.32*  LATICACIDVEN  --   --  4.08* 2.02* 1.9  --     Estimated Creatinine Clearance: 112.7 mL/min (A) (by C-G formula based on SCr of 0.32 mg/dL (L)).    No Known Allergies  Antimicrobials this admission: 9/5 Vancomycin >>  9/5 Zosyn >>  9/6 Levaquin >>   Dose adjustments this admission:  Microbiology results: 9/4 [facility]x (at [facility]): NGTD 9/5 MRSA PCR: negative  9/5 BCx: ordered 9/6 Urine legionella Ag: ordered 9/6 Urine strep pneumo Ag: ordered  Thank you for allowing pharmacy to be a part of this patient's care.  Gretta Arab PharmD, BCPS Pager [phone] 08/29/2018 1:56 PM

## 2018-08-29 NOTE — Progress Notes (Signed)
IP PROGRESS NOTE  Subjective:   Allen Glenn is known to me with a history of metastatic colon cancer.  He completed a first cycle of FOLFIRI chemotherapy 08/19/2018.  He was evaluated at the Cancer center on 08/27/2018 with tachycardia and dyspnea.  He was afebrile.  The neutrophil count was adequate.  A CT of the chest was negative for evidence of pneumonia or pulmonary embolism. He presented to the emergency room yesterday with a fever and tachycardia.  He was admitted and placed on broad-spectrum intravenous antibiotics.  He reports difficulty taking a deep breath.  No other complaint.  If and daughter are at the bedside.  Objective: Vital signs in last 24 hours: Blood pressure (!) 111/47, pulse (!) 106, temperature 99.4 F (37.4 C), temperature source Oral, resp. rate (!) 33, height 6' 2"  (1.88 m), weight 185 lb 10 oz (84.2 kg), SpO2 91 %.  Intake/Output from previous day: 09/05 0701 - 09/06 0700 In: 3486.3 [P.O.:500; I.V.:2396.9; IV Piggyback:589.3] Out: 2800 [Urine:2800]  Physical Exam:  HEENT: No thrush Lungs: Inspiratory rales at the upper anterior chest bilaterally, no respiratory distress Cardiac: Regular rate and rhythm Abdomen: Nontender, no hepatosplenomegaly Extremities: Trace lower leg and foot edema bilaterally   Portacath/PICC-without erythema  Lab Results: Recent Labs    08/27/18 0931 08/28/18 0823  WBC 2.8* 2.4*  HGB 10.1* 9.5*  HCT 30.6* 28.3*  PLT 63* 67*   ANC 1.9 BMET Recent Labs    08/28/18 0823 08/29/18 0444  NA 131* 134*  K 4.6 4.0  CL 97* 102  CO2 24 23  GLUCOSE 261* 205*  BUN 15 7*  CREATININE 0.45* 0.32*  CALCIUM 7.6* 7.4*    Lab Results  Component Value Date   CEA1 71.41 (H) 08/19/2018    Studies/Results: Dg Chest 2 View  Result Date: 08/27/2018 CLINICAL DATA:  62 year old with current history of metastatic colon cancer for which the patient is undergoing chemotherapy, presenting with two-day history of fatigue, generalized  weakness and tachycardia. EXAM: CHEST - 2 VIEW COMPARISON:  Chest x-rays 07/14/2018, 12/06/2016 and earlier. CT chest 02/25/2018, 11/26/2017 and earlier. MRI thoracic spine 07/04/2018 is correlated. FINDINGS: Cardiac silhouette normal in size, unchanged. Thoracic aorta mildly atherosclerotic, unchanged. Hilar and mediastinal contours otherwise unremarkable. RIGHT jugular Port-A-Cath tip in the UPPER RIGHT atrium. Evolving post radiation changes involving the INFERIOR LEFT UPPER LOBE which now has the appearance of linear scarring. Atelectasis involving the RIGHT LOWER LOBE and RIGHT MIDDLE LOBE, with associated mild elevation of the RIGHT hemidiaphragm. No confluent airspace consolidation. Metastasis involving the RIGHT ANTERIOR fourth rib with a mixed lytic and sclerotic appearance, overlying the RIGHT mid lung and RIGHT lung base on the PA image. Mixed lytic and sclerotic metastases involving the mid and UPPER thoracic spine as noted on the recent MRI. IMPRESSION: 1. Mild atelectasis involving the RIGHT LOWER LOBE and RIGHT MIDDLE LOBE. No acute cardiopulmonary disease otherwise. 2. Involving post radiation changes involving the INFERIOR LEFT UPPER LOBE which now has the appearance of linear scarring. 3. Osseous metastatic disease involving the RIGHT ANTERIOR fourth rib and multiple mid and UPPER thoracic vertebrae. Electronically Signed   By: Evangeline Dakin M.D.   On: 08/27/2018 13:10   Ct Angio Chest Pe W Or Wo Contrast  Result Date: 08/27/2018 CLINICAL DATA:  62 year old male with metastatic colon cancer. Shortness of breath for 2 days. EXAM: CT ANGIOGRAPHY CHEST WITH CONTRAST TECHNIQUE: Multidetector CT imaging of the chest was performed using the standard protocol during bolus administration of intravenous  contrast. Multiplanar CT image reconstructions and MIPs were obtained to evaluate the vascular anatomy. CONTRAST:  128m ISOVUE-370 IOPAMIDOL (ISOVUE-370) INJECTION 76% COMPARISON:  Restaging CT chest  abdomen and pelvis 02/25/2018 and earlier. Thoracic and lumbar spine MRI 07/04/2018. FINDINGS: Cardiovascular: Good contrast bolus timing in the pulmonary arterial tree. Respiratory motion in the lower lobes. No focal filling defect identified in the pulmonary arteries to suggest acute pulmonary embolism. Calcified aortic atherosclerosis. Calcified coronary artery atherosclerosis and/or stent. Right chest porta cath is new since March. No cardiomegaly or pericardial effusion. Mediastinum/Nodes: No mediastinal lymphadenopathy, but there is nonspecific increasing left hilar soft tissue as seen on series 5, image 147, measuring up to 11 millimeters in thickness. The right hilum appears stable and negative. Negative thoracic inlet. Lungs/Pleura: Lower lung volumes.  The major airways are patent. Increased size and number of numerous pulmonary nodules throughout both lungs. Nodules range from punctate to 8-9 millimeters diameter. Superimposed right greater than left costophrenic angle atelectasis. Trace right pleural effusion. Upper Abdomen: Hyperenhancing liver metastases demonstrated on 02/25/2018 are less apparent today, but likely related to contrast timing as with narrow liver windows a large anterior right liver mass now measures at least 6 centimeters diameter (approximately 3 centimeters previously). Negative visible spleen, pancreas, adrenal glands, kidneys and bowel in the upper abdomen. Musculoskeletal: New sclerotic thoracic vertebral lesions at T1 and T4 through T6 levels are compatible with osseous metastatic disease. Confluent vertebral body involvement T4 through T6 is associated with mild pathologic fracture of T5 and bulky right anterolateral extraosseous extension of tumor as demonstrated on series 5, image 97. No obvious epidural tumor or evidence of cord compression at that level. These levels appears similar to the 07/04/2018 MRI. Review of the MIP images confirms the above findings. IMPRESSION: 1.   Negative for acute pulmonary embolus. 2. Progression of metastatic disease since the CTs on 02/25/2018. Numerous bilateral pulmonary nodules, new left hilar lymphadenopathy, enlarged liver tumor, and also multifocal osseous metastatic disease (thoracic spine metastases appear similar to the MRI on 07/04/2018). Electronically Signed   By: HGenevie AnnM.D.   On: 08/27/2018 16:15    Medications: I have reviewed the patient's current medications.  Assessment/Plan:  1. 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 No BRAFmutation  Liver biopsy 05/04/2015-metastatic adenocarcinoma consistent with a colon primary ? Staging PET scan 06/08/2015-isolated segment 4A liver lesion ? Initiation of adjuvant FOLFOX 06/13/2015 ? Restaging CT 08/09/2015 revealed a slight decrease in a borderline ileocolic node, decrease in the hepatic dome metastasis, no new lesions ? Liver resection 09/28/2015-pathology consistent with metastatic colon cancer, negative margins ? Adjuvant FOLFOX resumed 11/08/2015, oxaliplatin eliminated beginning 11/22/2015 secondary to neuropathy. He completed adjuvant chemotherapy 02/16/2016 ? Restaging chest CT 11/08/2016, compared to 05/07/2016 revealed a new 9 mm left upper lobe nodule, stable 2.2 cm right hepatic lesion ? PET scan 11/19/2016 confirmed a hypermetabolic left upper lobe nodule, hypermetabolic left paratracheal and pericardiac lymph nodes, and hypermetabolism associated with the hypoattenuating lesion in the dome of the liver ? Status post EBUSbiopsies of the left lingula nodule and a level 4Lnode on 11/28/2016-no evidence of malignancy ? CT chest 02/11/2017-increase in size of the left pulmonary nodule and epicardial lymph node ? Status post SBRT 2 the left lung nodule and mediastinum completed 03/14/2017 ? CTs 07/01/2017-new 9 mm focus  along the right liver capsule, stable left subcapsular liver lesion, radiation change at site of left upper  lobe nodule, stable pericardiallymph node ? CTs 11/25/2017-new 5 mm lingular nodule, enlargement of capsular-based right liver lesion, new capsular based right liver lesion capsular lesion at the hepatic dome ? CTs 02/25/2018-enlargement of small lung nodules, liver lesions and small mesenteric lymph node ? CT abdomen/pelvis 05/15/2018-new lower lobe pulmonary nodules, enlargement of liver lesions and right lower quadrant soft tissue nodules ? 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 Brain MRI 05/26/2018-3.5 similar left posterior fossa mass, 4 additional subcentimeter enhancing brain lesions-3 cerebellar, 1 left thalamic ? SRS to all 5 brain lesions 06/02/2018 ? Surgical excision of left cerebellar lesion 06/04/2018, adenocarcinoma consistent with colorectal primary ? MRI lumbar spine 06/18/2018-extensive metastases to the lumbar spine, sacrum, T12, and pelvis, pathologic fracture of L3 ? Initiation of radiation from T12-S2 06/30/2018, completed 07/11/2018 ? Cycle 1 FOLFIRI 08/19/2018 2. Coronary artery disease status post a myocardial infarction in 2014  3. Hypertension  4. Hyperlipidemia  5. Diabetes  6. Admission 05/20/2018 with nausea/vomitingsecondary to a cerebellar metastasis  7.Oral candidiasis 06/11/2018-started on Mycelex troches  8. 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  Admission 08/28/2018 with a febrile illness, likely pneumonia  10.  Anemia/thrombocytopenia secondary to chemotherapy and infection   Allen Glenn is admitted with a febrile illness and failure to thrive.  He has developed respiratory distress and a chest x-ray today reveals evidence of pneumonia.  We should consider atypical pneumonia and PCP (prolonged steroid use) if his  condition does not improve. He has developed anemia and thrombocytopenia secondary to chemotherapy and infection.  Allen Glenn is at risk for infection secondary to recent hospitalizations, prolonged steroid use, and underlying malignancy.  Recommendations: 1.  Continue broad-spectrum antibiotics, follow-up cultures 2.  CBC with differential 3.  Oncology will check on him 08/30/2018   LOS: 1 day   Betsy Coder, MD   08/29/2018, 7:06 AM

## 2018-08-30 ENCOUNTER — Inpatient Hospital Stay (HOSPITAL_COMMUNITY): Payer: 59

## 2018-08-30 DIAGNOSIS — D61810 Antineoplastic chemotherapy induced pancytopenia: Secondary | ICD-10-CM

## 2018-08-30 DIAGNOSIS — C182 Malignant neoplasm of ascending colon: Secondary | ICD-10-CM

## 2018-08-30 DIAGNOSIS — I340 Nonrheumatic mitral (valve) insufficiency: Secondary | ICD-10-CM

## 2018-08-30 LAB — ECHOCARDIOGRAM COMPLETE
HEIGHTINCHES: 74 in
WEIGHTICAEL: 2970.04 [oz_av]

## 2018-08-30 LAB — BASIC METABOLIC PANEL
ANION GAP: 10 (ref 5–15)
BUN: 10 mg/dL (ref 8–23)
CO2: 25 mmol/L (ref 22–32)
Calcium: 7.7 mg/dL — ABNORMAL LOW (ref 8.9–10.3)
Chloride: 96 mmol/L — ABNORMAL LOW (ref 98–111)
Creatinine, Ser: 0.41 mg/dL — ABNORMAL LOW (ref 0.61–1.24)
GFR calc non Af Amer: >60 mL/min (ref >60)
Glucose, Bld: 187 mg/dL — ABNORMAL HIGH (ref 70–99)
POTASSIUM: 3.6 mmol/L (ref 3.5–5.1)
SODIUM: 131 mmol/L — AB (ref 135–145)

## 2018-08-30 LAB — GLUCOSE, CAPILLARY
GLUCOSE-CAPILLARY: 258 mg/dL — AB (ref 70–99)
Glucose-Capillary: 243 mg/dL — ABNORMAL HIGH (ref 70–99)
Glucose-Capillary: 331 mg/dL — ABNORMAL HIGH (ref 70–99)
Glucose-Capillary: 387 mg/dL — ABNORMAL HIGH (ref 70–99)

## 2018-08-30 LAB — ALBUMIN: ALBUMIN: 1.9 g/dL — AB (ref 3.5–5.0)

## 2018-08-30 LAB — CBC WITH DIFFERENTIAL/PLATELET
BASOS ABS: 0 10*3/uL (ref 0.0–0.1)
BASOS PCT: 0 %
EOS ABS: 0 10*3/uL (ref 0.0–0.7)
Eosinophils Relative: 0 %
HCT: 25.3 % — ABNORMAL LOW (ref 39.0–52.0)
HEMOGLOBIN: 8.6 g/dL — AB (ref 13.0–17.0)
LYMPHS ABS: 0.1 10*3/uL — AB (ref 0.7–4.0)
Lymphocytes Relative: 7 %
MCH: 31.2 pg (ref 26.0–34.0)
MCHC: 34 g/dL (ref 30.0–36.0)
MCV: 91.7 fL (ref 78.0–100.0)
Monocytes Absolute: 0 10*3/uL — ABNORMAL LOW (ref 0.1–1.0)
Monocytes Relative: 3 %
NEUTROS PCT: 90 %
Neutro Abs: 1.2 10*3/uL — ABNORMAL LOW (ref 1.7–7.7)
Platelets: 58 10*3/uL — ABNORMAL LOW (ref 150–400)
RBC: 2.76 MIL/uL — AB (ref 4.22–5.81)
RDW: 18.3 % — ABNORMAL HIGH (ref 11.5–15.5)
WBC: 1.3 10*3/uL — AB (ref 4.0–10.5)

## 2018-08-30 LAB — MAGNESIUM: MAGNESIUM: 1.9 mg/dL (ref 1.7–2.4)

## 2018-08-30 MED ORDER — TBO-FILGRASTIM 480 MCG/0.8ML ~~LOC~~ SOSY
480.0000 ug | PREFILLED_SYRINGE | Freq: Every day | SUBCUTANEOUS | Status: DC
  Administered 2018-08-30 – 2018-08-31 (×2): 480 ug via SUBCUTANEOUS
  Filled 2018-08-30 (×3): qty 0.8

## 2018-08-30 MED ORDER — INSULIN ASPART 100 UNIT/ML ~~LOC~~ SOLN
3.0000 [IU] | Freq: Three times a day (TID) | SUBCUTANEOUS | Status: DC
  Administered 2018-08-30 – 2018-08-31 (×5): 3 [IU] via SUBCUTANEOUS

## 2018-08-30 MED ORDER — NYSTATIN 100000 UNIT/ML MT SUSP
5.0000 mL | Freq: Four times a day (QID) | OROMUCOSAL | Status: DC
  Administered 2018-08-30 – 2018-09-11 (×40): 500000 [IU] via ORAL
  Filled 2018-08-30 (×41): qty 5

## 2018-08-30 MED ORDER — POTASSIUM CHLORIDE CRYS ER 20 MEQ PO TBCR
40.0000 meq | EXTENDED_RELEASE_TABLET | Freq: Once | ORAL | Status: AC
  Administered 2018-08-30: 40 meq via ORAL
  Filled 2018-08-30: qty 2

## 2018-08-30 MED ORDER — FUROSEMIDE 10 MG/ML IJ SOLN
40.0000 mg | Freq: Every day | INTRAMUSCULAR | Status: DC
  Administered 2018-08-30: 40 mg via INTRAVENOUS
  Filled 2018-08-30: qty 4

## 2018-08-30 MED ORDER — INSULIN GLARGINE 100 UNIT/ML ~~LOC~~ SOLN
35.0000 [IU] | Freq: Every day | SUBCUTANEOUS | Status: DC
  Administered 2018-08-30 – 2018-09-01 (×3): 35 [IU] via SUBCUTANEOUS
  Filled 2018-08-30 (×3): qty 0.35

## 2018-08-30 MED ORDER — LEVALBUTEROL HCL 0.63 MG/3ML IN NEBU: 0.6300 mg | INHALATION_SOLUTION | Freq: Two times a day (BID) | RESPIRATORY_TRACT | Status: DC

## 2018-08-30 MED ORDER — METOPROLOL TARTRATE 5 MG/5ML IV SOLN: 5.0000 mg | INTRAVENOUS | Status: DC | PRN

## 2018-08-30 MED ORDER — PREDNISONE 20 MG PO TABS
40.0000 mg | ORAL_TABLET | Freq: Every day | ORAL | Status: DC
  Administered 2018-08-31 – 2018-09-01 (×2): 40 mg via ORAL
  Filled 2018-08-30 (×2): qty 2

## 2018-08-30 MED ORDER — METOPROLOL TARTRATE 5 MG/5ML IV SOLN
2.5000 mg | Freq: Four times a day (QID) | INTRAVENOUS | Status: DC | PRN
  Administered 2018-08-30 – 2018-09-02 (×4): 2.5 mg via INTRAVENOUS
  Filled 2018-08-30 (×4): qty 5

## 2018-08-30 NOTE — Progress Notes (Signed)
Physical Therapy Treatment Patient Details Name: Allen Glenn MRN: [id number] DOB: [birth year] Today's Date: 08/30/2018    History of Present Illness Pt admitted with sepsis/PNA and with hx significant of lumbar compression fx late June 2019,  hypertension, diabetes mellitus type 2, BPH, history of diastolic CHF, hyperlipidemia, CAD, metastatic colon cancer with brain metastasis as well as thoracic disease.    PT Comments    Pt continues very motivated/cooperative but ltd by fatigue and elevating HR.   Follow Up Recommendations  Home health PT     Equipment Recommendations  None recommended by PT    Recommendations for Other Services OT consult     Precautions / Restrictions Precautions Precautions: Fall;Other (comment) Precaution Comments: Elevated HR Restrictions Weight Bearing Restrictions: No    Mobility  Bed Mobility Overal bed mobility: Needs Assistance Bed Mobility: Rolling;Sidelying to Sit Rolling: Min guard Sidelying to sit: Min assist;Mod assist     Sit to sidelying: Min assist;Mod assist General bed mobility comments: cues for sequence and techinque.  Physical assist to bring trunk to upright   Transfers Overall transfer level: Needs assistance Equipment used: Rolling walker (2 wheeled) Transfers: Sit to/from Stand Sit to Stand: Min assist;+2 safety/equipment;From elevated surface         General transfer comment: cues for use of UEs to self assist  Ambulation/Gait Ambulation/Gait assistance: Min assist;+2 safety/equipment Gait Distance (Feet): 18 Feet Assistive device: Rolling walker (2 wheeled) Gait Pattern/deviations: Step-to pattern;Decreased step length - right;Decreased step length - left;Shuffle Gait velocity: decr   General Gait Details: Increased time.  Cues for posture and position from RW.  Ltd by elevating HR   Stairs             Wheelchair Mobility    Modified Rankin (Stroke Patients Only)       Balance Overall balance  assessment: Needs assistance Sitting-balance support: No upper extremity supported;Feet supported Sitting balance-Leahy Scale: Good     Standing balance support: Bilateral upper extremity supported Standing balance-Leahy Scale: Poor                              Cognition Arousal/Alertness: Awake/alert Behavior During Therapy: WFL for tasks assessed/performed Overall Cognitive Status: Within Functional Limits for tasks assessed                                        Exercises      General Comments        Pertinent Vitals/Pain Pain Assessment: No/denies pain    Home Living Family/patient expects to be discharged to:: Private residence Living Arrangements: Spouse/significant other Available Help at Discharge: Family;Available 24 hours/day Type of Home: House Home Access: Ramped entrance   Home Layout: Able to live on main level with bedroom/bathroom;Two level Home Equipment: Bedside commode;Cane - single point;Shower seat;Wheelchair - [employer] - 2 wheels;Walker - 4 wheels      Prior Function Level of Independence: Independent with assistive device(s)      Comments: Pt utilizing cane vs RW for ambulation.   PT Goals (current goals can now be found in the care plan section) Acute Rehab PT Goals Patient Stated Goal: Regain IND PT Goal Formulation: With patient Time For Goal Achievement: 09/13/18 Potential to Achieve Goals: Good Progress towards PT goals: Progressing toward goals    Frequency    Min 3X/week  PT Plan Current plan remains appropriate    Co-evaluation              AM-PAC PT "6 Clicks" Daily Activity  Outcome Measure  Difficulty turning over in bed (including adjusting bedclothes, sheets and blankets)?: A Lot Difficulty moving from lying on back to sitting on the side of the bed? : Unable Difficulty sitting down on and standing up from a chair with arms (e.g., wheelchair, bedside commode, etc,.)?:  Unable Help needed moving to and from a bed to chair (including a wheelchair)?: A Little Help needed walking in hospital room?: A Little Help needed climbing 3-5 steps with a railing? : A Lot 6 Click Score: 12    End of Session Equipment Utilized During Treatment: Gait belt;Oxygen Activity Tolerance: Patient limited by fatigue Patient left: in bed;with call bell/phone within reach;with family/visitor present Nurse Communication: Mobility status PT Visit Diagnosis: Difficulty in walking, not elsewhere classified (R26.2);Muscle weakness (generalized) (M62.81)     Time: 8003-4917 PT Time Calculation (min) (ACUTE ONLY): 21 min  Charges:  $Gait Training: 8-22 mins                     Pg [phone]    Ikechukwu Cerny 08/30/2018, 3:32 PM

## 2018-08-30 NOTE — Progress Notes (Signed)
  Echocardiogram 2D Echocardiogram has been performed.  Allen Glenn 08/30/2018, 11:39 AM

## 2018-08-30 NOTE — Progress Notes (Signed)
Pt HR has sustained in the 130s-140s and gotten as high as the 150s with activity. MD paged. Orders received for 2.5mg  lopressor PRN. Orders carried out. HR now sustaining 110s-120s with stable BP. Will continue to monitor.

## 2018-08-30 NOTE — Progress Notes (Addendum)
PROGRESS NOTE [facility]   URI TURNBOUGH   DJM:[id number] DOB: [birth year]  DOA: 08/28/2018 PCP: [name], MD   Brief Narrative:  Allen Glenn is a 62 year old male with past medical history of diabetes, CAD, hypertension and metastatic colon cancer on chemotherapy 8/27 who presented to the emergency department complaining of fever, fatigue and tachycardia.  Upon ED evaluation patient was found to be hypoxic, elevated lactic acid and febrile.  Patient was admitted with working diagnosis of sepsis of unknown origin and started on empiric antibiotics.  Subjective: Patient seen and examined, reports feeling slight better however breathing still a problem.  Denies cough, chest pain and palpitations.  Remains tachycardic, per wife at bedside patient was warmed feels that he has a fever.  No febrile episodes reported by nursing staff.  No acute events overnight.  Assessment & Plan: Sepsis due to pneumonia Initial images on 9/4 did not show signs of infection, however patient was slightly dehydrated on presentation.  CTA of the chest did not show PE but shows progression of metastatic lesion to the lungs.  Repeated chest x-ray on 9/6 shows worsening of bilateral airspace opacity concerning for pneumonia versus pulmonary edema. I personally reviewed chest x-ray which is consistent with pneumonia, also some signs of fluid overload.  Patient been treated with vancomycin, MRSA negative will discontinue this for now.  Also on Zosyn and Levaquin, will continue for 24 more hours.  If patient remains afebrile will discontinue Zosyn and keep only on Levaquin.  Blood cultures so far negative.  Procalcitonin elevated. Now neutropenic, will start neutropenic precautions.  Continue to monitor.   Addendum 6:32 PM Case discussed with ID, recommending to continue Zosyn and Levaquin for now, if continues to spike fevers resume Vanc and escalate to merrem. CT chest reviewed compatible with pneumonia.   Acute  respiratory failure with hypoxia Felt to be secondary to pneumonia and fluid overload.  Patient received aggressive IV hydration.  Patient had good diuresis overnight, negative ~2.5 L, today lungs still congested will continue Lasix 40 mg daily.  Check echocardiogram to rule out cardiomyopathy.  Continue oxygen supplementation as needed.  Thrombocytopenia In setting of chemotherapy and sepsis. No signs of bleeding.  Platelet counts improving today.  Continue to monitor.   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 appreciated recommendations. CT shows some progression of lung diseases.  Continue management per oncology  HTN BP stable, continue lisinopril  Sinus tachycardia  Likely sepsis physiology response. Get echo, no chest pain.  Type 2 DM   Last A1C 7.9. CBG's acceptable, patient on steroids. Lantus has been reduce. Patient with poor appetite. Continue current regimen, adjust insulin pending 24 hrs of insulin requirement.   DVT prophylaxis: SCD's Code Status: Full Code  Family Communication: Wife at bedside  Disposition Plan: TBD   Consultants:   Oncology   Procedures:   None   Antimicrobials: Anti-infectives (From admission, onward)   Start     Dose/Rate Route Frequency Ordered Stop   08/29/18 1400  levofloxacin (LEVAQUIN) IVPB 750 mg     750 mg 100 mL/hr over 90 Minutes Intravenous Every 24 hours 08/29/18 1353     08/28/18 2000  vancomycin (VANCOCIN) 1,250 mg in sodium chloride 0.9 % 250 mL IVPB  Status:  Discontinued     1,250 mg 166.7 mL/hr over 90 Minutes Intravenous Every 12 hours 08/28/18 1725 08/30/18 0853   08/28/18 1500  piperacillin-tazobactam (ZOSYN)  IVPB 3.375 g     3.375 g 12.5 mL/hr over 240 Minutes Intravenous Every 8 hours 08/28/18 1245     08/28/18 0900  vancomycin (VANCOCIN) 1,500 mg in sodium chloride 0.9 % 500 mL IVPB     1,500 mg 250 mL/hr over 120 Minutes  Intravenous  Once 08/28/18 0854 08/28/18 1218   08/28/18 0900  piperacillin-tazobactam (ZOSYN) IVPB 3.375 g     3.375 g 100 mL/hr over 30 Minutes Intravenous  Once 08/28/18 0854 08/28/18 0935      Objective: Vitals:   08/30/18 0600 08/30/18 0800 08/30/18 0808 08/30/18 0810  BP: (!) 144/62   (!) 149/58  Pulse: (!) 128   (!) 126  Resp: (!) 36   (!) 24  Temp:  98.7 F (37.1 C)    TempSrc:  Oral    SpO2: 92%  92% 95%  Weight:      Height:        Intake/Output Summary (Last 24 hours) at 08/30/2018 0856 Last data filed at 08/30/2018 8032 Gross per 24 hour  Intake 1280.35 ml  Output 4675 ml  Net -3394.65 ml   Filed Weights   08/28/18 1400  Weight: 84.2 kg    Examination:  General: Pt is alert, awake, not in acute distress Cardiovascular: Tachycardia, S1/S2 +, no rubs, no gallops Respiratory: Decreased breath sounds bilaterally however improved from yesterday.  Mild bibasilar rales.  No wheezing Abdominal: Soft, NT, ND, bowel sounds + Extremities: no edema, no cyanosis Skin: No rashes Psychiatry: Mood and affect appropriate  Data Reviewed: I have personally reviewed following labs and imaging studies  CBC: Recent Labs  Lab 08/27/18 0931 08/28/18 0823 08/29/18 0444 08/30/18 0630  WBC 2.8* 2.4* 2.0* 1.3*  NEUTROABS 2.5 2.2 1.9 1.2*  HGB 10.1* 9.5* 7.8* 8.6*  HCT 30.6* 28.3* 23.7* 25.3*  MCV 91.9 92.2 93.7 91.7  PLT 63* 67* 49* 58*   Basic Metabolic Panel: Recent Labs  Lab 08/27/18 0931 08/28/18 0823 08/29/18 0444 08/30/18 0630  NA 130* 131* 134* 131*  K 4.0 4.6 4.0 3.6  CL 98 97* 102 96*  CO2 22 24 23 25   GLUCOSE 259* 261* 205* 187*  BUN 17 15 7* 10  CREATININE 0.52* 0.45* 0.32* 0.41*  CALCIUM 7.8* 7.6* 7.4* 7.7*  MG  --  1.8  --  1.9   GFR: Estimated Creatinine Clearance: 112.7 mL/min (A) (by C-G formula based on SCr of 0.41 mg/dL (L)). Liver Function Tests: Recent Labs  Lab 08/27/18 0931 08/28/18 0823 08/30/18 0630  AST 24 29  --   ALT 46* 42   --   ALKPHOS 367* 315*  --   BILITOT 0.5 0.5  --   PROT 5.1* 5.4*  --   ALBUMIN 2.3* 2.3* 1.9*   No results for input(s): LIPASE, AMYLASE in the last 168 hours. No results for input(s): AMMONIA in the last 168 hours. Coagulation Profile: No results for input(s): INR, PROTIME in the last 168 hours. Cardiac Enzymes: No results for input(s): CKTOTAL, CKMB, CKMBINDEX, TROPONINI in the last 168 hours. BNP (last 3 results) No results for input(s): PROBNP in the last 8760 hours. HbA1C: No results for input(s): HGBA1C in the last 72 hours. CBG: Recent Labs  Lab 08/29/18 0727 08/29/18 1205 08/29/18 1546 08/29/18 2147 08/30/18 0751  GLUCAP 152* 195* 290* 313* 243*   Lipid Profile: No results for input(s): CHOL, HDL, LDLCALC, TRIG, CHOLHDL, LDLDIRECT in the last 72 hours. Thyroid Function Tests: No results for input(s): TSH, T4TOTAL, FREET4,  T3FREE, THYROIDAB in the last 72 hours. Anemia Panel: No results for input(s): VITAMINB12, FOLATE, FERRITIN, TIBC, IRON, RETICCTPCT in the last 72 hours. Sepsis Labs: Recent Labs  Lab 08/28/18 0842 08/28/18 1143 08/28/18 1909 08/29/18 1316  PROCALCITON  --   --   --  0.93  LATICACIDVEN 4.08* 2.02* 1.9  --     Recent Results (from the past 240 hour(s))  Culture, Blood     Status: None (Preliminary result)   Collection Time: 08/27/18  9:51 AM  Result Value Ref Range Status   Specimen Description BLOOD BLOOD RIGHT HAND PORTA CATH  Final   Special Requests   Final    BOTTLES DRAWN AEROBIC AND ANAEROBIC Blood Culture adequate volume   Culture   Final    NO GROWTH 2 DAYS Performed at [facility], [place] [address], [place], [place] [address]    Report Status PENDING  Incomplete  Culture, Blood     Status: None (Preliminary result)   Collection Time: 08/27/18 10:10 AM  Result Value Ref Range Status   Specimen Description   Final    BLOOD RIGHT HAND Performed at [facility], [place] [address] [place],  [place] [address]    Special Requests   Final    NONE Performed at [facility], [place] [address] [place], [place] [address]    Culture   Final    NO GROWTH 2 DAYS Performed at [facility] [address], [place], [place] [address]    Report Status PENDING  Incomplete  MRSA PCR Screening     Status: None   Collection Time: 08/28/18  1:45 PM  Result Value Ref Range Status   MRSA by PCR NEGATIVE NEGATIVE Final    Comment:        The GeneXpert MRSA Assay (FDA approved for NASAL specimens only), is one component of a comprehensive MRSA colonization surveillance program. It is not intended to diagnose MRSA infection nor to guide or monitor treatment for MRSA infections. Performed at [facility], [place] [address], [place], [place] [address]   Culture, blood (x 2)     Status: None (Preliminary result)   Collection Time: 08/28/18  7:09 PM  Result Value Ref Range Status   Specimen Description   Final    BLOOD LEFT HAND Performed at [facility] [address] [place], [place] [address]    Special Requests   Final    BOTTLES DRAWN AEROBIC AND ANAEROBIC Blood Culture adequate volume Performed at [facility] [address] [place], [place] [address]    Culture   Final    NO GROWTH < 24 HOURS Performed at [facility] [address], [place], [place] [address]    Report Status PENDING  Incomplete  Culture, blood (x 2)     Status: None (Preliminary result)   Collection Time: 08/28/18  7:15 PM  Result Value Ref Range Status   Specimen Description   Final    BLOOD RIGHT HAND Performed at [facility] [address] [place], [place] [address]    Special Requests   Final    BOTTLES DRAWN AEROBIC AND ANAEROBIC Blood Culture adequate volume Performed at [facility] [address], [place], [place] [address]    Culture   Final    NO GROWTH < 24  HOURS Performed at [place],  [place] [address]    Report Status PENDING  Incomplete      Radiology Studies: Dg Chest 2 View  Result Date: 08/29/2018 CLINICAL DATA:  Shortness of breath, fever, sepsis. History of MI, hypertension, colonic malignancy. EXAM: CHEST - 2 VIEW COMPARISON:  Chest x-ray and chest CT scan of September 4th 2019 FINDINGS: The lungs are well-expanded. Patchy airspace opacities bilaterally have markedly increased in conspicuity. There is no pleural effusion. The heart is normal in size. The pulmonary vascularity is indistinct. There is calcification in the wall of the aortic arch. There is soft tissue fullness in the right paratracheal region which is stable. There is scleroses of the anterior aspect of the right third rib. IMPRESSION: Interval worsening of bilateral airspace opacities worrisome for pneumonia or pulmonary edema or less likely lymphangitic spread of malignancy. There are multiple pulmonary nodules and mediastinal and hilar lymphadenopathy which were well demonstrated on the August 27, 2018 chest CT scan. Thoracic aortic atherosclerosis. Electronically Signed   By: David  Martinique M.D.   On: 08/29/2018 11:54      Scheduled Meds: . atorvastatin  10 mg Oral q1800  . Chlorhexidine Gluconate Cloth  6 each Topical Daily  . dexamethasone  2 mg Oral Daily  . furosemide  40 mg Intravenous Daily  . gabapentin  300 mg Oral TID  . insulin aspart  0-5 Units Subcutaneous QHS  . insulin aspart  0-9 Units Subcutaneous TID WC  . insulin glargine  22 Units Subcutaneous QHS  . lisinopril  2.5 mg Oral Daily  . mouth rinse  15 mL Mouth Rinse BID  . morphine  15 mg Oral Daily  . multivitamin with minerals  1 tablet Oral Daily  . pantoprazole  40 mg Oral Daily  . protein supplement shake  11 oz Oral TID BM  . sodium chloride flush  3 mL Intravenous Q12H  . tamsulosin  0.4 mg Oral QPC breakfast   Continuous Infusions: . sodium chloride  Stopped (08/30/18 0808)  . levofloxacin (LEVAQUIN) IV Stopped (08/29/18 1553)  . piperacillin-tazobactam (ZOSYN)  IV 12.5 mL/hr at 08/30/18 0808     LOS: 2 days   Time spent: Total of 35 minutes spent with pt, greater than 50% of which was spent in discussion of  treatment, counseling and coordination of care   Chipper Oman, MD Pager: Text Page via [email or web address]   If 7PM-7AM, please contact night-coverage [email or web address] 08/30/2018, 8:56 AM   Note - This record has been created using [employer]. Chart creation errors have been sought, but may not always have been located. Such creation errors do not reflect on the standard of medical care.

## 2018-08-30 NOTE — Progress Notes (Signed)
Allen Glenn   HEMATOLOGY/ONCOLOGY INPATIENT PROGRESS NOTE  Date of Service: 08/30/2018  Inpatient Attending: .Patrecia Pour, Christean Grief, MD   SUBJECTIVE  Patient was seen on behalf of Dr. [name] for continued oncologic follow-up .  Notes that he is feeling a little better and his fevers are starting to come down on broad-spectrum antibiotics for his pneumonia with bilateral lung infiltrates.  Currently on Zosyn, vancomycin and levofloxacin.  He is about 11 days out from his first cycle of FOLFIRI.  Noted to be neutropenic with an ANC of 1.2k and monocyte counts of 0 suggesting that he might not have nadired yet.  Discussed and after patient's consent was obtained he was started on Granix to try to avoid worsening neutropenia in the setting of significant sepsis from his pneumonia. Noted to have significant sinus tachycardia.    OBJECTIVE:  Appears fatigued  PHYSICAL EXAMINATION: . Vitals:   08/30/18 0808 08/30/18 0810 08/30/18 1000 08/30/18 1200  BP:  (!) 149/58 (!) 148/48 (!) 144/57  Pulse:  (!) 126 (!) 130 (!) 140  Resp:  (!) 24 (!) 36 (!) 40  Temp:    (!) 100.5 F (38.1 C)  TempSrc:    Axillary  SpO2: 92% 95% 92% 96%  Weight:      Height:       Filed Weights   08/28/18 1400  Weight: 185 lb 10 oz (84.2 kg)   .Body mass index is 23.83 kg/m. Allen KitchenTemp (24hrs), Avg:99.7 F (37.6 C), Min:98.1 F (36.7 C), Max:102.6 F (39.2 C)   GENERAL:alert, in no acute distress and comfortable SKIN: skin color, texture, turgor are normal, no rashes or significant lesions EYES: normal, conjunctiva are pink and non-injected, sclera clear OROPHARYNX:no exudate, no erythema and lips, buccal mucosa, and tongue normal  NECK: supple, no JVD, thyroid normal size, non-tender, without nodularity LYMPH:  no palpable lymphadenopathy in the cervical, axillary or inguinal LUNGS: clear to auscultation with normal respiratory effort HEART: regular rate & rhythm,  no murmurs and no lower extremity edema ABDOMEN:  abdomen soft, non-tender, normoactive bowel sounds  Musculoskeletal: no cyanosis of digits and no clubbing  PSYCH: alert & oriented x 3 with fluent speech NEURO: no focal motor/sensory deficits  MEDICAL HISTORY:  Past Medical History:  Diagnosis Date  . Acute MI ([place]) 2014   acute ST elevation MI  . Anemia   . Colon cancer ([place]) 2016   Status post resection of colon mass is well as he panic metastasis.   . Coronary artery disease   . Diabetes mellitus without complication ([place])   . Enlarged prostate    slightly and takes Flomax daily  . Heart disease   . History of blood transfusion   . Hyperlipidemia    takes Lipitor daily  . Hypertension    takes Lisinopril daily  . Lung nodule    left  . Nocturia   . Numbness    left foot  . Pancreatitis   . Peripheral neuropathy     SURGICAL HISTORY: Past Surgical History:  Procedure Laterality Date  . 1/8 of liver removed    . APPENDECTOMY    . APPLICATION OF CRANIAL NAVIGATION Left 06/04/2018   Procedure: APPLICATION OF CRANIAL NAVIGATION;  Surgeon: Erline Levine, MD;  Location: [place];  Service: Neurosurgery;  Laterality: Left;  . BIOPSY  05/22/2018   Procedure: BIOPSY;  Surgeon: Carol Ada, MD;  Location: [facility];  Service: Endoscopy;;  . BRAIN SURGERY  06/04/2018  . COLONOSCOPY    . CORONARY ANGIOPLASTY  1 stent  . CORONARY STENT PLACEMENT  06-27-2013  . CRANIOTOMY Left 06/04/2018   Procedure: CRANIOTOMY TUMOR EXCISION with Brainlab;  Surgeon: Erline Levine, MD;  Location: [place];  Service: Neurosurgery;  Laterality: Left;  CRANIOTOMY TUMOR EXCISION with Brainlab  . ESOPHAGOGASTRODUODENOSCOPY Left 05/22/2018   Procedure: ESOPHAGOGASTRODUODENOSCOPY (EGD);  Surgeon: Carol Ada, MD;  Location: [facility];  Service: Endoscopy;  Laterality: Left;  . HERNIA REPAIR Left 1991  . IR IMAGING GUIDED PORT INSERTION  07/18/2018  . LAPAROSCOPIC RIGHT COLECTOMY  2016   [place]  . LIVER LOBECTOMY Right 09/28/2015    Right  partial hep[facility] at [facility]  . LUNG BIOPSY Left 11/28/2016   Procedure: LUNG BIOPSY, left upper lobe;  Surgeon: [name], MD;  Location: [place];  Service: Thoracic;  Laterality: Left;  Allen Glenn VIDEO BRONCHOSCOPY WITH ENDOBRONCHIAL NAVIGATION N/A 11/28/2016   Procedure: VIDEO BRONCHOSCOPY WITH ENDOBRONCHIAL NAVIGATION;  Surgeon: [name], MD;  Location: [place];  Service: Thoracic;  Laterality: N/A;  . VIDEO BRONCHOSCOPY WITH ENDOBRONCHIAL ULTRASOUND N/A 11/28/2016   Procedure: VIDEO BRONCHOSCOPY WITH ENDOBRONCHIAL ULTRASOUND;  Surgeon: [name], MD;  Location: [place];  Service: Thoracic;  Laterality: N/A;    SOCIAL HISTORY: Social History   Socioeconomic History  . Marital status: Married    Spouse name: [name]  . Number of children: 2  . Years of education: Bachelor  . Highest education level: Not on file  Occupational History  . Occupation: Company secretary  Social Needs  . Financial resource strain: Not on file  . Food insecurity:    Worry: Not on file    Inability: Not on file  . Transportation needs:    Medical: Not on file    Non-medical: Not on file  Tobacco Use  . Smoking status: Never Smoker  . Smokeless tobacco: Never Used  Substance and Sexual Activity  . Alcohol use: No    Alcohol/week: 0.0 standard drinks  . Drug use: No  . Sexual activity: Not on file  Lifestyle  . Physical activity:    Days per week: Not on file    Minutes per session: Not on file  . Stress: Not on file  Relationships  . Social connections:    Talks on phone: Not on file    Gets together: Not on file    Attends religious service: Not on file    Active member of club or organization: Not on file    Attends meetings of clubs or organizations: Not on file    Relationship status: Not on file  . Intimate partner violence:    Fear of current or ex partner: Not on file    Emotionally abused: Not on file    Physically abused: Not on file    Forced sexual activity: Not on file  Other  Topics Concern  . Not on file  Social History Narrative   Live at home with wife, [name]   Patient is a [employer] manager, active and goes to gym   Caffeine: very little soda, occasional tea. 12oz-16oz/per    FAMILY HISTORY: Family History  Problem Relation Age of Onset  . Hypertension Mother   . Heart disease Mother   . Hypertension Father   . Heart disease Father   . Neuropathy Neg Hx     ALLERGIES:  has No Known Allergies.  MEDICATIONS:  Scheduled Meds: . atorvastatin  10 mg Oral q1800  . Chlorhexidine Gluconate Cloth  6 each Topical Daily  . dexamethasone  2 mg  Oral Daily  . furosemide  40 mg Intravenous Daily  . gabapentin  300 mg Oral TID  . insulin aspart  0-5 Units Subcutaneous QHS  . insulin aspart  0-9 Units Subcutaneous TID WC  . insulin aspart  3 Units Subcutaneous TID WC  . insulin glargine  35 Units Subcutaneous QHS  . lisinopril  2.5 mg Oral Daily  . mouth rinse  15 mL Mouth Rinse BID  . morphine  15 mg Oral Daily  . multivitamin with minerals  1 tablet Oral Daily  . pantoprazole  40 mg Oral Daily  . protein supplement shake  11 oz Oral TID BM  . sodium chloride flush  3 mL Intravenous Q12H  . tamsulosin  0.4 mg Oral QPC breakfast   Continuous Infusions: . sodium chloride Stopped (08/30/18 0808)  . levofloxacin (LEVAQUIN) IV Stopped (08/29/18 1553)  . piperacillin-tazobactam (ZOSYN)  IV 12.5 mL/hr at 08/30/18 1121   PRN Meds:.sodium chloride, acetaminophen **OR** acetaminophen, diphenoxylate-atropine, hydrALAZINE, levalbuterol, LORazepam, magic mouthwash, metoCLOPramide, morphine injection, nitroGLYCERIN, ondansetron **OR** ondansetron (ZOFRAN) IV, polyethylene glycol, sodium chloride flush, sodium chloride flush, traZODone  REVIEW OF SYSTEMS:    10 Point review of Systems was done is negative except as noted above.   LABORATORY DATA:  I have reviewed the data as listed  . CBC Latest Ref Rng & Units 08/30/2018 08/29/2018 08/28/2018  WBC 4.0 - 10.5 K/uL  1.3(LL) 2.0(L) 2.4(L)  Hemoglobin 13.0 - 17.0 g/dL 8.6(L) 7.8(L) 9.5(L)  Hematocrit 39.0 - 52.0 % 25.3(L) 23.7(L) 28.3(L)  Platelets 150 - 400 K/uL 58(L) 49(L) 67(L)  ANC 1.2k  . CMP Latest Ref Rng & Units 08/30/2018 08/29/2018 08/28/2018  Glucose 70 - 99 mg/dL 187(H) 205(H) 261(H)  BUN 8 - 23 mg/dL 10 7(L) 15  Creatinine 0.61 - 1.24 mg/dL 0.41(L) 0.32(L) 0.45(L)  Sodium 135 - 145 mmol/L 131(L) 134(L) 131(L)  Potassium 3.5 - 5.1 mmol/L 3.6 4.0 4.6  Chloride 98 - 111 mmol/L 96(L) 102 97(L)  CO2 22 - 32 mmol/L _0 Calcium 8.9 - 10.3 mg/dL 7.7(L) 7.4(L) 7.6(L)  Total Protein 6.5 - 8.1 g/dL - - 5.4(L)  Total Bilirubin 0.3 - 1.2 mg/dL - - 0.5  Alkaline Phos 38 - 126 U/L - - 315(H)  AST 15 - 41 U/L - - 29  ALT 0 - 44 U/L - - 42   Component     Latest Ref Rng & Units 05/21/2018  Campylobacter species     NOT DETECTED NOT DETECTED  Plesimonas shigelloides     NOT DETECTED NOT DETECTED  Salmonella species     NOT DETECTED NOT DETECTED  Yersinia enterocolitica     NOT DETECTED NOT DETECTED  Vibrio species     NOT DETECTED NOT DETECTED  Vibrio cholerae     NOT DETECTED NOT DETECTED  Enteroaggregative E coli (EAEC)     NOT DETECTED NOT DETECTED  Enteropathogenic E coli (EPEC)     NOT DETECTED NOT DETECTED  Enterotoxigenic E coli (ETEC)     NOT DETECTED NOT DETECTED  Shiga like toxin producing E coli (STEC)     NOT DETECTED NOT DETECTED  Shigella/Enteroinvasive E coli (EIEC)     NOT DETECTED NOT DETECTED  Cryptosporidium     NOT DETECTED NOT DETECTED  Cyclospora cayetanensis     NOT DETECTED NOT DETECTED  Entamoeba histolytica     NOT DETECTED NOT DETECTED  Giardia lamblia     NOT DETECTED NOT DETECTED  Adenovirus F40/41  NOT DETECTED NOT DETECTED  Astrovirus     NOT DETECTED NOT DETECTED  Norovirus GI/GII     NOT DETECTED NOT DETECTED  Rotavirus A     NOT DETECTED NOT DETECTED  Sapovirus (I, II, IV, and V)     NOT DETECTED NOT DETECTED   RADIOGRAPHIC STUDIES: I  have personally reviewed the radiological images as listed and agreed with the findings in the report. Dg Chest 2 View  Result Date: 08/29/2018 CLINICAL DATA:  Shortness of breath, fever, sepsis. History of MI, hypertension, colonic malignancy. EXAM: CHEST - 2 VIEW COMPARISON:  Chest x-ray and chest CT scan of September 4th 2019 FINDINGS: The lungs are well-expanded. Patchy airspace opacities bilaterally have markedly increased in conspicuity. There is no pleural effusion. The heart is normal in size. The pulmonary vascularity is indistinct. There is calcification in the wall of the aortic arch. There is soft tissue fullness in the right paratracheal region which is stable. There is scleroses of the anterior aspect of the right third rib. IMPRESSION: Interval worsening of bilateral airspace opacities worrisome for pneumonia or pulmonary edema or less likely lymphangitic spread of malignancy. There are multiple pulmonary nodules and mediastinal and hilar lymphadenopathy which were well demonstrated on the August 27, 2018 chest CT scan. Thoracic aortic atherosclerosis. Electronically Signed   By: David  Martinique M.D.   On: 08/29/2018 11:54   Dg Chest 2 View  Result Date: 08/27/2018 CLINICAL DATA:  62 year old with current history of metastatic colon cancer for which the patient is undergoing chemotherapy, presenting with two-day history of fatigue, generalized weakness and tachycardia. EXAM: CHEST - 2 VIEW COMPARISON:  Chest x-rays 07/14/2018, 12/06/2016 and earlier. CT chest 02/25/2018, 11/26/2017 and earlier. MRI thoracic spine 07/04/2018 is correlated. FINDINGS: Cardiac silhouette normal in size, unchanged. Thoracic aorta mildly atherosclerotic, unchanged. Hilar and mediastinal contours otherwise unremarkable. RIGHT jugular Port-A-Cath tip in the UPPER RIGHT atrium. Evolving post radiation changes involving the INFERIOR LEFT UPPER LOBE which now has the appearance of linear scarring. Atelectasis involving the  RIGHT LOWER LOBE and RIGHT MIDDLE LOBE, with associated mild elevation of the RIGHT hemidiaphragm. No confluent airspace consolidation. Metastasis involving the RIGHT ANTERIOR fourth rib with a mixed lytic and sclerotic appearance, overlying the RIGHT mid lung and RIGHT lung base on the PA image. Mixed lytic and sclerotic metastases involving the mid and UPPER thoracic spine as noted on the recent MRI. IMPRESSION: 1. Mild atelectasis involving the RIGHT LOWER LOBE and RIGHT MIDDLE LOBE. No acute cardiopulmonary disease otherwise. 2. Involving post radiation changes involving the INFERIOR LEFT UPPER LOBE which now has the appearance of linear scarring. 3. Osseous metastatic disease involving the RIGHT ANTERIOR fourth rib and multiple mid and UPPER thoracic vertebrae. Electronically Signed   By: Evangeline Dakin M.D.   On: 08/27/2018 13:10   Ct Angio Chest Pe W Or Wo Contrast  Result Date: 08/27/2018 CLINICAL DATA:  62 year old male with metastatic colon cancer. Shortness of breath for 2 days. EXAM: CT ANGIOGRAPHY CHEST WITH CONTRAST TECHNIQUE: Multidetector CT imaging of the chest was performed using the standard protocol during bolus administration of intravenous contrast. Multiplanar CT image reconstructions and MIPs were obtained to evaluate the vascular anatomy. CONTRAST:  162m ISOVUE-370 IOPAMIDOL (ISOVUE-370) INJECTION 76% COMPARISON:  Restaging CT chest abdomen and pelvis 02/25/2018 and earlier. Thoracic and lumbar spine MRI 07/04/2018. FINDINGS: Cardiovascular: Good contrast bolus timing in the pulmonary arterial tree. Respiratory motion in the lower lobes. No focal filling defect identified in the pulmonary arteries to suggest  acute pulmonary embolism. Calcified aortic atherosclerosis. Calcified coronary artery atherosclerosis and/or stent. Right chest porta cath is new since March. No cardiomegaly or pericardial effusion. Mediastinum/Nodes: No mediastinal lymphadenopathy, but there is nonspecific  increasing left hilar soft tissue as seen on series 5, image 147, measuring up to 11 millimeters in thickness. The right hilum appears stable and negative. Negative thoracic inlet. Lungs/Pleura: Lower lung volumes.  The major airways are patent. Increased size and number of numerous pulmonary nodules throughout both lungs. Nodules range from punctate to 8-9 millimeters diameter. Superimposed right greater than left costophrenic angle atelectasis. Trace right pleural effusion. Upper Abdomen: Hyperenhancing liver metastases demonstrated on 02/25/2018 are less apparent today, but likely related to contrast timing as with narrow liver windows a large anterior right liver mass now measures at least 6 centimeters diameter (approximately 3 centimeters previously). Negative visible spleen, pancreas, adrenal glands, kidneys and bowel in the upper abdomen. Musculoskeletal: New sclerotic thoracic vertebral lesions at T1 and T4 through T6 levels are compatible with osseous metastatic disease. Confluent vertebral body involvement T4 through T6 is associated with mild pathologic fracture of T5 and bulky right anterolateral extraosseous extension of tumor as demonstrated on series 5, image 97. No obvious epidural tumor or evidence of cord compression at that level. These levels appears similar to the 07/04/2018 MRI. Review of the MIP images confirms the above findings. IMPRESSION: 1.  Negative for acute pulmonary embolus. 2. Progression of metastatic disease since the CTs on 02/25/2018. Numerous bilateral pulmonary nodules, new left hilar lymphadenopathy, enlarged liver tumor, and also multifocal osseous metastatic disease (thoracic spine metastases appear similar to the MRI on 07/04/2018). Electronically Signed   By: Genevie Ann M.D.   On: 08/27/2018 16:15    ASSESSMENT & PLAN:    1. 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 No BRAFmutation With widely metastatic disease.  2. Sepsis due to pneumonia  3. Neutropenia ANC 1.2k due to chemotherapy with sepsis being additional risk factor.  4. Bilateral pneumonia with sepsis.  Blood cultures negative to date  5.  Anemia/thrombocytopenia secondary to chemotherapy and infection  PLAN -Awaiting final results of blood culture -Appreciate excellent care by hospitalist team. -Currently on broad-spectrum antibiotics with Zosyn, vancomycin and levofloxacin. -No other overt source of infection at this time. -Given significant sepsis now with neutropenia will start the patient on Granix 40 mcg daily until there is a clear improving trend with ANC more than 1500 for at least 48 hours. -If no improvement in his fevers might need to switch to Zosyn to a carbapenem. -Other supportive cares by hospitalist. -Might need to consider adding growth factor from next cycle of FOLFIRI chemotherapy treatment and possible dose adjustments given infection risks and limited bone marrow reserve in the setting of significant previous chemotherapy and RT. - will followup tomorrow. -Dr [name] will f/u from Monday. -Discussed plan with the patient and his wife at bedside.  I spent 25 minutes counseling the patient face to face. The total time spent in the appointment was 35 minutes and more than 50% was on counseling and direct patient cares.    Sullivan Lone MD Barry AAHIVMS [facility] Hematology/Oncology Physician [facility]  (Office):       [phone] (Work cell):  [phone] (Fax):           [phone]  08/30/2018 12:56 PM

## 2018-08-30 NOTE — Evaluation (Signed)
Physical Therapy Evaluation Patient Details Name: Allen Glenn MRN: [id number] DOB: 1956-05-23 Today's Date: 08/30/2018   History of Present Illness  Pt admitted with sepsis/PNA and with hx significant of lumbar compression fx late June 2019,  hypertension, diabetes mellitus type 2, BPH, history of diastolic CHF, hyperlipidemia, CAD, metastatic colon cancer with brain metastasis as well as thoracic disease.  Clinical Impression  Pt admitted as above and presenting with functional mobility limitations 2* generalized weakness, balance deficits, and very limited endurance with HR elevating to 150 with min exertion.  Pt very motivated/cooperative and hopes to progress to dc home with family assist and continued HHPT.    Follow Up Recommendations Home health PT    Equipment Recommendations  None recommended by PT    Recommendations for Other Services OT consult     Precautions / Restrictions Precautions Precautions: Fall;Other (comment) Precaution Comments: Elevated HR Restrictions Weight Bearing Restrictions: No      Mobility  Bed Mobility Overal bed mobility: Needs Assistance Bed Mobility: Rolling;Sidelying to Sit;Sit to Sidelying Rolling: Min guard Sidelying to sit: Min assist;Mod assist     Sit to sidelying: Min assist;Mod assist General bed mobility comments: cues for sequence and techinque.  Physical assist to bring trunk to upright   Transfers Overall transfer level: Needs assistance Equipment used: Rolling walker (2 wheeled) Transfers: Sit to/from Stand Sit to Stand: Min assist;+2 safety/equipment;From elevated surface         General transfer comment: cues for use of UEs to self assist  Ambulation/Gait Ambulation/Gait assistance: Min assist;+2 safety/equipment Gait Distance (Feet): 3 Feet Assistive device: Rolling walker (2 wheeled) Gait Pattern/deviations: Step-to pattern;Decreased step length - right;Decreased step length - left;Shuffle Gait velocity: decr   General Gait Details: Pt side stepped up side of bed only   Stairs            Wheelchair Mobility    Modified Rankin (Stroke Patients Only)       Balance Overall balance assessment: Needs assistance Sitting-balance support: No upper extremity supported;Feet supported Sitting balance-Leahy Scale: Good     Standing balance support: Bilateral upper extremity supported Standing balance-Leahy Scale: Poor                               Pertinent Vitals/Pain Pain Assessment: No/denies pain    Home Living Family/patient expects to be discharged to:: Private residence Living Arrangements: Spouse/significant other Available Help at Discharge: Family;Available 24 hours/day Type of Home: House Home Access: Ramped entrance     Home Layout: Able to live on main level with bedroom/bathroom;Two level Home Equipment: Bedside commode;Cane - single point;Shower seat;Wheelchair - [employer] - 2 wheels;Walker - 4 wheels      Prior Function Level of Independence: Independent with assistive device(s)         Comments: Pt utilizing cane vs RW for ambulation.     Hand Dominance        Extremity/Trunk Assessment   Upper Extremity Assessment Upper Extremity Assessment: Generalized weakness    Lower Extremity Assessment Lower Extremity Assessment: Generalized weakness(L LE weaker than R) RLE Deficits / Details: ROM WFL with ~4/5 strength LLE Deficits / Details: AROM WFL with strength~ 3+/5       Communication   Communication: No difficulties  Cognition Arousal/Alertness: Awake/alert Behavior During Therapy: WFL for tasks assessed/performed Overall Cognitive Status: Within Functional Limits for tasks assessed  General Comments      Exercises     Assessment/Plan    PT Assessment Patient needs continued PT services  PT Problem List Decreased strength;Decreased activity tolerance;Decreased  balance;Decreased mobility;Decreased knowledge of use of DME       PT Treatment Interventions DME instruction;Gait training;Stair training;Functional mobility training;Therapeutic activities;Therapeutic exercise;Patient/family education;Balance training    PT Goals (Current goals can be found in the Care Plan section)  Acute Rehab PT Goals Patient Stated Goal: Regain IND PT Goal Formulation: With patient Time For Goal Achievement: 09/13/18 Potential to Achieve Goals: Good    Frequency Min 3X/week   Barriers to discharge        Co-evaluation               AM-PAC PT "6 Clicks" Daily Activity  Outcome Measure Difficulty turning over in bed (including adjusting bedclothes, sheets and blankets)?: A Lot Difficulty moving from lying on back to sitting on the side of the bed? : Unable Difficulty sitting down on and standing up from a chair with arms (e.g., wheelchair, bedside commode, etc,.)?: Unable Help needed moving to and from a bed to chair (including a wheelchair)?: A Little Help needed walking in hospital room?: A Little Help needed climbing 3-5 steps with a railing? : A Lot 6 Click Score: 12    End of Session Equipment Utilized During Treatment: Gait belt;Oxygen Activity Tolerance: Patient limited by fatigue Patient left: in bed;with call bell/phone within reach;with family/visitor present Nurse Communication: Mobility status PT Visit Diagnosis: Difficulty in walking, not elsewhere classified (R26.2);Muscle weakness (generalized) (M62.81)    Time: 9432-7614 PT Time Calculation (min) (ACUTE ONLY): 21 min   Charges:   PT Evaluation $PT Eval Moderate Complexity: 1 Mod          Pg [phone]   Ela Moffat 08/30/2018, 3:27 PM

## 2018-08-31 DIAGNOSIS — D701 Agranulocytosis secondary to cancer chemotherapy: Secondary | ICD-10-CM | POA: Diagnosis present

## 2018-08-31 DIAGNOSIS — T451X5A Adverse effect of antineoplastic and immunosuppressive drugs, initial encounter: Secondary | ICD-10-CM | POA: Diagnosis present

## 2018-08-31 LAB — BLOOD GAS, ARTERIAL
Acid-Base Excess: 0.7 mmol/L (ref 0.0–2.0)
BICARBONATE: 22.4 mmol/L (ref 20.0–28.0)
Drawn by: 257881
O2 CONTENT: 3 L/min
O2 Saturation: 88.9 %
PH ART: 7.497 — AB (ref 7.350–7.450)
PO2 ART: 58.5 mmHg — AB (ref 83.0–108.0)
Patient temperature: 37
pCO2 arterial: 29.2 mmHg — ABNORMAL LOW (ref 32.0–48.0)

## 2018-08-31 LAB — COMPREHENSIVE METABOLIC PANEL
ALT: 44 U/L (ref 0–44)
AST: 29 U/L (ref 15–41)
Albumin: 1.9 g/dL — ABNORMAL LOW (ref 3.5–5.0)
Alkaline Phosphatase: 249 U/L — ABNORMAL HIGH (ref 38–126)
Anion gap: 11 (ref 5–15)
BUN: 16 mg/dL (ref 8–23)
CHLORIDE: 98 mmol/L (ref 98–111)
CO2: 24 mmol/L (ref 22–32)
CREATININE: 0.33 mg/dL — AB (ref 0.61–1.24)
Calcium: 7.9 mg/dL — ABNORMAL LOW (ref 8.9–10.3)
GFR calc Af Amer: >60 mL/min (ref >60)
GFR calc non Af Amer: >60 mL/min (ref >60)
Glucose, Bld: 131 mg/dL — ABNORMAL HIGH (ref 70–99)
POTASSIUM: 4 mmol/L (ref 3.5–5.1)
Sodium: 133 mmol/L — ABNORMAL LOW (ref 135–145)
Total Bilirubin: 0.4 mg/dL (ref 0.3–1.2)
Total Protein: 5.3 g/dL — ABNORMAL LOW (ref 6.5–8.1)

## 2018-08-31 LAB — CBC WITH DIFFERENTIAL/PLATELET
BAND NEUTROPHILS: 0 %
BASOS ABS: 0 10*3/uL (ref 0.0–0.1)
BLASTS: 0 %
Basophils Relative: 0 %
Eosinophils Absolute: 0 10*3/uL (ref 0.0–0.7)
Eosinophils Relative: 0 %
HEMATOCRIT: 25 % — AB (ref 39.0–52.0)
HEMOGLOBIN: 8.3 g/dL — AB (ref 13.0–17.0)
LYMPHS ABS: 0.1 10*3/uL — AB (ref 0.7–4.0)
Lymphocytes Relative: 5 %
MCH: 30.4 pg (ref 26.0–34.0)
MCHC: 33.2 g/dL (ref 30.0–36.0)
MCV: 91.6 fL (ref 78.0–100.0)
MONOS PCT: 3 %
Metamyelocytes Relative: 0 %
Monocytes Absolute: 0.1 10*3/uL (ref 0.1–1.0)
Myelocytes: 0 %
NEUTROS ABS: 1.5 10*3/uL — AB (ref 1.7–7.7)
Neutrophils Relative %: 92 %
PROMYELOCYTES RELATIVE: 0 %
Platelets: 78 10*3/uL — ABNORMAL LOW (ref 150–400)
RBC: 2.73 MIL/uL — AB (ref 4.22–5.81)
RDW: 18.2 % — ABNORMAL HIGH (ref 11.5–15.5)
WBC: 1.7 10*3/uL — ABNORMAL LOW (ref 4.0–10.5)
nRBC: 0 /100 WBC

## 2018-08-31 LAB — GLUCOSE, CAPILLARY
GLUCOSE-CAPILLARY: 314 mg/dL — AB (ref 70–99)
Glucose-Capillary: 104 mg/dL — ABNORMAL HIGH (ref 70–99)
Glucose-Capillary: 128 mg/dL — ABNORMAL HIGH (ref 70–99)
Glucose-Capillary: 223 mg/dL — ABNORMAL HIGH (ref 70–99)

## 2018-08-31 LAB — LEGIONELLA PNEUMOPHILA SEROGP 1 UR AG: L. pneumophila Serogp 1 Ur Ag: NEGATIVE

## 2018-08-31 LAB — MAGNESIUM: MAGNESIUM: 2 mg/dL (ref 1.7–2.4)

## 2018-08-31 LAB — PROCALCITONIN: PROCALCITONIN: 3.2 ng/mL

## 2018-08-31 MED ORDER — LORAZEPAM 0.5 MG PO TABS: 0.5000 mg | ORAL_TABLET | Freq: Three times a day (TID) | ORAL | Status: DC | PRN

## 2018-08-31 MED ORDER — VANCOMYCIN HCL 10 G IV SOLR
1750.0000 mg | Freq: Once | INTRAVENOUS | Status: AC
  Administered 2018-08-31: 1750 mg via INTRAVENOUS
  Filled 2018-08-31: qty 1750

## 2018-08-31 MED ORDER — LORAZEPAM 2 MG/ML IJ SOLN
0.5000 mg | INTRAMUSCULAR | Status: DC | PRN
  Administered 2018-08-31 – 2018-09-01 (×4): 0.5 mg via INTRAVENOUS
  Filled 2018-08-31 (×4): qty 1

## 2018-08-31 MED ORDER — SODIUM CHLORIDE 0.9 % IV SOLN
INTRAVENOUS | Status: DC | PRN
  Administered 2018-08-31: 500 mL via INTRAVENOUS
  Administered 2018-09-04: 1000 mL via INTRAVENOUS
  Administered 2018-09-06 – 2018-09-08 (×3): 500 mL via INTRAVENOUS
  Administered 2018-09-09 – 2018-09-13 (×3): 250 mL via INTRAVENOUS
  Administered 2018-09-20: 1000 mL via INTRAVENOUS

## 2018-08-31 MED ORDER — VANCOMYCIN HCL 10 G IV SOLR
1250.0000 mg | Freq: Two times a day (BID) | INTRAVENOUS | Status: DC
  Administered 2018-08-31 – 2018-09-05 (×10): 1250 mg via INTRAVENOUS
  Filled 2018-08-31 (×11): qty 1250

## 2018-08-31 MED ORDER — INSULIN ASPART 100 UNIT/ML ~~LOC~~ SOLN
5.0000 [IU] | Freq: Three times a day (TID) | SUBCUTANEOUS | Status: DC
  Administered 2018-09-01 – 2018-09-05 (×2): 5 [IU] via SUBCUTANEOUS

## 2018-08-31 MED ORDER — SODIUM CHLORIDE 0.9 % IV SOLN
1.0000 g | Freq: Three times a day (TID) | INTRAVENOUS | Status: DC
  Administered 2018-08-31 – 2018-09-01 (×4): 1 g via INTRAVENOUS
  Filled 2018-08-31 (×5): qty 1

## 2018-08-31 NOTE — Progress Notes (Signed)
PT Cancellation Note  Patient Details Name: BRIXON ZHEN MRN: [id number] DOB: Apr 23, 1956   Cancelled Treatment:    Reason Eval/Treat Not Completed: Medical issues which prohibited therapy(pt on bipap, will follow. )   Philomena Doheny 08/31/2018, 1:30 PM [phone]

## 2018-08-31 NOTE — Progress Notes (Signed)
PROGRESS NOTE [facility]   [name]   MHD:[id number] DOB: 05/18/1956  DOA: 08/28/2018 PCP: [name], MD   Brief Narrative:  Allen Glenn is a 62 year old male with past medical history of diabetes, CAD, hypertension and metastatic colon cancer on chemotherapy 8/27 who presented to the emergency department complaining of fever, fatigue and tachycardia.  Upon ED evaluation patient was found to be hypoxic, elevated lactic acid and febrile.  Patient was admitted with working diagnosis of sepsis of unknown origin and started on empiric antibiotics.  Subjective: Patient seen and examined, continues to have hard time breathing, RR between 30-40. Remains tachycardic. Febrile this morning. Denies chest pain, palpitations and dizziness.  Family at bedside all questions answered.   Assessment & Plan: Sepsis due to pneumonia Bilateral PNA, not significant improvement with current antibiotic therapy. CT chest performed 9/7 shows extensive opacities throughout both lungs.  Small bilateral pleural effusion and bilateral lung nodule compatible with metastatic disease.  Given no significant clinical improvement will switch Zosyn to Merrem, resume vancomycin and continue Levaquin.  Patient started on prednisone 40 mg daily.  Continue supportive treatment with albuterol as needed and mucolytic's.  Acute respiratory failure with hypoxia Multifactorial due to pneumonia and fluid overload from aggressive diuresis.  Patient have diuresed very well about 5 L of urine output.  Currently breathing has not improved despite being euvolemic, ABG obtained at signs of CO2 retention.  Echocardiogram with grade 1 diastolic dysfunction, EF 65 to 70% no wall motion abnormalities.  Mild pulmonary hypertension.  Will place on BiPAP for rate control, continue to treat underlying causes.  Will hold Lasix today.  Thrombocytopenia - improving In setting of chemotherapy and sepsis. No signs of bleeding. Continue to  monitor.   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 appreciated recommendations. CT shows some progression of lung diseases.  Continue management per oncology  HTN BP remains stable, continue lisinopril.  Sinus tachycardia  Remains tachycardic, echo with no abnormalities.  Added IV metoprolol 2.5 mg as needed for heart rate above 130 bpm  Type 2 DM   1C7.9.  CBGs improved with increase of Lantus to home dose.  Continue meal coverage and sliding scale.  Continue to monitor CBG  Goals of cares I have discussed with patient and family to start thinking about goals of cares, cancer seems to be progressing and patient with a severe pneumonia.  Patient at this point has been placed on BiPAP and I have discussed the possibility of needing intubation if his respiratory rate does not improve.  Patient and family will discuss CODE STATUS and let us know later on during the day.  I offered palliative care consult which they will decide later on as well.  For now patient remains full code and continue current modality of treatment.  DVT prophylaxis: SCD's Code Status: Full Code  Family Communication: Wife at bedside  Disposition Plan: Keep in step down   Consultants:   Oncology   Procedures:   None   Antimicrobials: Anti-infectives (From admission, onward)   Start     Dose/Rate Route Frequency Ordered Stop   08/31/18 2200  vancomycin (VANCOCIN) 1,250 mg in sodium chloride 0.9 % 250 mL IVPB     1,250 mg 166.7 mL/hr over 90 Minutes Intravenous Every 12 hours 08/31/18 0852     08/31/18 1000  meropenem (MERREM) 1 g in sodium chloride 0.9 % 100 mL IVPB  1 g 200 mL/hr over 30 Minutes Intravenous Every 8 hours 08/31/18 0848     08/31/18 1000  vancomycin (VANCOCIN) 1,750 mg in sodium chloride 0.9 % 500 mL IVPB     1,750 mg 250 mL/hr over 120 Minutes Intravenous  Once 08/31/18 0852     08/29/18 1400   levofloxacin (LEVAQUIN) IVPB 750 mg     750 mg 100 mL/hr over 90 Minutes Intravenous Every 24 hours 08/29/18 1353     08/28/18 2000  vancomycin (VANCOCIN) 1,250 mg in sodium chloride 0.9 % 250 mL IVPB  Status:  Discontinued     1,250 mg 166.7 mL/hr over 90 Minutes Intravenous Every 12 hours 08/28/18 1725 08/30/18 0853   08/28/18 1500  piperacillin-tazobactam (ZOSYN) IVPB 3.375 g  Status:  Discontinued     3.375 g 12.5 mL/hr over 240 Minutes Intravenous Every 8 hours 08/28/18 1245 08/31/18 0844   08/28/18 0900  vancomycin (VANCOCIN) 1,500 mg in sodium chloride 0.9 % 500 mL IVPB     1,500 mg 250 mL/hr over 120 Minutes Intravenous  Once 08/28/18 0854 08/28/18 1218   08/28/18 0900  piperacillin-tazobactam (ZOSYN) IVPB 3.375 g     3.375 g 100 mL/hr over 30 Minutes Intravenous  Once 08/28/18 0854 08/28/18 0935      Objective: Vitals:   08/31/18 0102 08/31/18 0348 08/31/18 0700 08/31/18 0800  BP: 119/74  (!) 144/78 (!) 162/81  Pulse: (!) 117  (!) 124 (!) 127  Resp: (!) 41  (!) 32 (!) 30  Temp:  99.3 F (37.4 C)  (!) 102.6 F (39.2 C)  TempSrc:  Oral  Oral  SpO2: 92%  93% 92%  Weight:      Height:        Intake/Output Summary (Last 24 hours) at 08/31/2018 0934 Last data filed at 08/31/2018 0821 Gross per 24 hour  Intake 560.46 ml  Output 2500 ml  Net -1939.54 ml   Filed Weights   08/28/18 1400  Weight: 84.2 kg    Examination:  General: Mild respiratory distress. Cardiovascular: RRR, S1/S2 +, no rubs, no gallops Respiratory: Decreased breath sounds, bibasilar rales, scattered rhonchi.  No wheezing or crackles. Abdominal: Soft, NT, ND, bowel sounds + Extremities: no edema, no cyanosis Skin: No rashes  Psychiatry: Mood anxious  Data Reviewed: I have personally reviewed following labs and imaging studies  CBC: Recent Labs  Lab 08/27/18 0931 08/28/18 0823 08/29/18 0444 08/30/18 0630 08/31/18 0530  WBC 2.8* 2.4* 2.0* 1.3* 1.7*  NEUTROABS 2.5 2.2 1.9 1.2* 1.5*  HGB  10.1* 9.5* 7.8* 8.6* 8.3*  HCT 30.6* 28.3* 23.7* 25.3* 25.0*  MCV 91.9 92.2 93.7 91.7 91.6  PLT 63* 67* 49* 58* 78*   Basic Metabolic Panel: Recent Labs  Lab 08/27/18 0931 08/28/18 0823 08/29/18 0444 08/30/18 0630 08/31/18 0530  NA 130* 131* 134* 131* 133*  K 4.0 4.6 4.0 3.6 4.0  CL 98 97* 102 96* 98  CO2 22 24 23 25 24   GLUCOSE 259* 261* 205* 187* 131*  BUN 17 15 7* 10 16  CREATININE 0.52* 0.45* 0.32* 0.41* 0.33*  CALCIUM 7.8* 7.6* 7.4* 7.7* 7.9*  MG  --  1.8  --  1.9 2.0   GFR: Estimated Creatinine Clearance: 112.7 mL/min (A) (by C-G formula based on SCr of 0.33 mg/dL (L)). Liver Function Tests: Recent Labs  Lab 08/27/18 0931 08/28/18 0823 08/30/18 0630 08/31/18 0530  AST 24 29  --  29  ALT 46* 42  --  44  ALKPHOS 367*  315*  --  249*  BILITOT 0.5 0.5  --  0.4  PROT 5.1* 5.4*  --  5.3*  ALBUMIN 2.3* 2.3* 1.9* 1.9*   No results for input(s): LIPASE, AMYLASE in the last 168 hours. No results for input(s): AMMONIA in the last 168 hours. Coagulation Profile: No results for input(s): INR, PROTIME in the last 168 hours. Cardiac Enzymes: No results for input(s): CKTOTAL, CKMB, CKMBINDEX, TROPONINI in the last 168 hours. BNP (last 3 results) No results for input(s): PROBNP in the last 8760 hours. HbA1C: No results for input(s): HGBA1C in the last 72 hours. CBG: Recent Labs  Lab 08/30/18 0751 08/30/18 1203 08/30/18 1513 08/30/18 2131 08/31/18 0734  GLUCAP 243* 331* 387* 258* 104*   Lipid Profile: No results for input(s): CHOL, HDL, LDLCALC, TRIG, CHOLHDL, LDLDIRECT in the last 72 hours. Thyroid Function Tests: No results for input(s): TSH, T4TOTAL, FREET4, T3FREE, THYROIDAB in the last 72 hours. Anemia Panel: No results for input(s): VITAMINB12, FOLATE, FERRITIN, TIBC, IRON, RETICCTPCT in the last 72 hours. Sepsis Labs: Recent Labs  Lab 08/28/18 0842 08/28/18 1143 08/28/18 1909 08/29/18 1316 08/31/18 0530  PROCALCITON  --   --   --  0.93 3.20    LATICACIDVEN 4.08* 2.02* 1.9  --   --     Recent Results (from the past 240 hour(s))  Culture, Blood     Status: None (Preliminary result)   Collection Time: 08/27/18  9:51 AM  Result Value Ref Range Status   Specimen Description BLOOD BLOOD RIGHT HAND PORTA CATH  Final   Special Requests   Final    BOTTLES DRAWN AEROBIC AND ANAEROBIC Blood Culture adequate volume   Culture   Final    NO GROWTH 4 DAYS Performed at [facility], [place] [address] [place], [place] [address]    Report Status PENDING  Incomplete  Culture, Blood     Status: None (Preliminary result)   Collection Time: 08/27/18 10:10 AM  Result Value Ref Range Status   Specimen Description   Final    BLOOD RIGHT HAND Performed at [facility], [place] [address], [place], [place] [address]    Special Requests   Final    NONE Performed at [facility], [place] [address] [place], [place] [address]    Culture   Final    NO GROWTH 4 DAYS Performed at [facility], [place] [address], [place], [place] [address]    Report Status PENDING  Incomplete  MRSA PCR Screening     Status: None   Collection Time: 08/28/18  1:45 PM  Result Value Ref Range Status   MRSA by PCR NEGATIVE NEGATIVE Final    Comment:        The GeneXpert MRSA Assay (FDA approved for NASAL specimens only), is one component of a comprehensive MRSA colonization surveillance program. It is not intended to diagnose MRSA infection nor to guide or monitor treatment for MRSA infections. Performed at [facility], [place] [address] [place], [place] [address]   Culture, blood (x 2)     Status: None (Preliminary result)   Collection Time: 08/28/18  7:09 PM  Result Value Ref Range Status   Specimen Description   Final    BLOOD LEFT HAND Performed at [place] [address], [place], [place] [address]    Special Requests   Final    BOTTLES DRAWN AEROBIC AND  ANAEROBIC Blood Culture adequate volume  Performed at [facility], [place] [address], [place], [place] [address]    Culture   Final    NO GROWTH 3 DAYS Performed at [facility], [place] [address] [place], [place] [address]    Report Status PENDING  Incomplete  Culture, blood (x 2)     Status: None (Preliminary result)   Collection Time: 08/28/18  7:15 PM  Result Value Ref Range Status   Specimen Description   Final    BLOOD RIGHT HAND Performed at [place] [address], [place], [place] [address]    Special Requests   Final    BOTTLES DRAWN AEROBIC AND ANAEROBIC Blood Culture adequate volume Performed at [place] [address], [place], [place] [address]    Culture   Final    NO GROWTH 3 DAYS Performed at [facility], [place] [address] [place],  [address]    Report Status PENDING  Incomplete      Radiology Studies: Dg Chest 2 View  Result Date: 08/29/2018 CLINICAL DATA:  Shortness of breath, fever, sepsis. History of MI, hypertension, colonic malignancy. EXAM: CHEST - 2 VIEW COMPARISON:  Chest x-ray and chest CT scan of September 4th 2019 FINDINGS: The lungs are well-expanded. Patchy airspace opacities bilaterally have markedly increased in conspicuity. There is no pleural effusion. The heart is normal in size. The pulmonary vascularity is indistinct. There is calcification in the wall of the aortic arch. There is soft tissue fullness in the right paratracheal region which is stable. There is scleroses of the anterior aspect of the right third rib. IMPRESSION: Interval worsening of bilateral airspace opacities worrisome for pneumonia or pulmonary edema or less likely lymphangitic spread of malignancy. There are multiple pulmonary nodules and mediastinal and hilar lymphadenopathy which were well demonstrated on the August 27, 2018 chest CT scan. Thoracic aortic atherosclerosis. Electronically Signed    By: David  Martinique M.D.   On: 08/29/2018 11:54   Ct Chest Wo Contrast  Result Date: 08/30/2018 CLINICAL DATA:  Fever, fatigue and tachycardia. Undergoing chemotherapy for metastatic colon cancer. EXAM: CT CHEST WITHOUT CONTRAST TECHNIQUE: Multidetector CT imaging of the chest was performed following the standard protocol without IV contrast. COMPARISON:  Chest radiographs obtained yesterday. Chest CTA dated 08/27/2018. Abdomen and pelvis CT dated 02/25/2018. FINDINGS: Cardiovascular: Atheromatous calcifications, including the thoracic aorta. Coronary artery stent. Right jugular catheter tip in the right atrium. Mediastinum/Nodes: Mildly prominent AP window lymph node with a short axis diameter of 9 mm on image number 57 series 2, previously 8 mm. Normal appearing thyroid gland. Lungs/Pleura: Interval extensive patchy opacities throughout both lungs and increased right lower lobe atelectasis. Multiple small bilateral lung nodules are again demonstrated. Small bilateral pleural effusions, increased on the right and new on the left. Upper Abdomen: Faint, oval low density mass in the anterior aspect of the medial segment of the left lobe of the liver, measuring 4.9 x 3.9 cm on image number 118 series 2. An enlarged lymph node or peritoneal mass anterior to that portion of the liver has not changed significantly, measuring 1.9 x 1.1 cm on image number 116 series 2. Musculoskeletal: No significant change in patchy sclerosis in the T1, T4, T5 and T6 vertebral bodies with paraspinal extension on the right at the T4-5 level. There is also patchy sclerosis with extension beyond the bone margins into the adjacent soft tissues involving the right 4th rib. IMPRESSION: 1. Interval extensive patchy opacities throughout both lungs,  compatible with pneumonia. 2. Small bilateral pleural effusions, increased on the right and new on the left. 3. Stable small bilateral lung nodules compatible with metastatic disease. 4. Increased  right lower lobe atelectasis. 5. Faint, oval low density mass in the anterior aspect of the medial segment of the left lobe of the liver, measuring 4.9 x 3.9 cm, compatible with a previously demonstrated metastasis. Other previously demonstrated liver metastases are not well visualized today without intravenous contrast. 6. Stable enlarged lymph node or peritoneal metastasis anterior to the liver on the right. 7. No significant change in metastatic disease involving the T1, T4, T5 and T6 vertebral bodies with paraspinal extension on the right at the T4-5 level. 8. Similar appearing right 4th rib metastasis with extension beyond the rib margins. 9. Minimal increase in size of a probable metastatic AP window lymph node. 10. Calcified aortic atherosclerosis. Aortic Atherosclerosis (ICD10-I70.0). Electronically Signed   By: Claudie Revering M.D.   On: 08/30/2018 16:49      Scheduled Meds: . atorvastatin  10 mg Oral q1800  . Chlorhexidine Gluconate Cloth  6 each Topical Daily  . gabapentin  300 mg Oral TID  . insulin aspart  0-5 Units Subcutaneous QHS  . insulin aspart  0-9 Units Subcutaneous TID WC  . insulin aspart  3 Units Subcutaneous TID WC  . insulin glargine  35 Units Subcutaneous QHS  . lisinopril  2.5 mg Oral Daily  . mouth rinse  15 mL Mouth Rinse BID  . morphine  15 mg Oral Daily  . multivitamin with minerals  1 tablet Oral Daily  . nystatin  5 mL Oral QID  . pantoprazole  40 mg Oral Daily  . predniSONE  40 mg Oral Q breakfast  . protein supplement shake  11 oz Oral TID BM  . sodium chloride flush  3 mL Intravenous Q12H  . tamsulosin  0.4 mg Oral QPC breakfast  . Tbo-filgastrim (GRANIX) SQ  480 mcg Subcutaneous Daily   Continuous Infusions: . sodium chloride Stopped (08/30/18 0808)  . levofloxacin (LEVAQUIN) IV Stopped (08/30/18 1506)  . meropenem (MERREM) IV    . vancomycin    . vancomycin       LOS: 3 days   Time spent: Total of 35 minutes spent with pt, greater than 50% of  which was spent in discussion of  treatment, counseling and coordination of care   Chipper Oman, MD Pager: Text Page via [email or web address]   If 7PM-7AM, please contact night-coverage [email or web address] 08/31/2018, 9:34 AM   Note - This record has been created using [employer]. Chart creation errors have been sought, but may not always have been located. Such creation errors do not reflect on the standard of medical care.

## 2018-08-31 NOTE — Progress Notes (Signed)
Pharmacy Antibiotic Note  Allen Glenn is a 62 y.o. male admitted on 08/28/2018 with sepsis secondary to pneumonia. Patient initially placed on Vancomycin and Zosyn, then Levofloxacin added 9/6 for atypical coverage. Vancomycin d/c'ed yesterday due to negative MRSA PCR. Patient now neutropenic, spiking fevers, so MD has resumed Vancomycin and Zosyn broadened to Meropenem. Pharmacy consulted to assist with dosing of antibiotics.   Plan:  Vancomycin 1750mg  IV x 1, then 1250mg  IV q12h for goal AUC 400-500.   Plan for Vancomycin levels at steady state, as indicated.  Meropenem 1g IV q8h.   Continue Levofloxacin 750mg  IV q24h.   Monitor renal function, cultures, clinical course.   Height: 6\' 2"  (188 cm) Weight: 185 lb 10 oz (84.2 kg) IBW/kg (Calculated) : 82.2  Temp (24hrs), Avg:99.7 F (37.6 C), Min:98.1 F (36.7 C), Max:102.6 F (39.2 C)  Recent Labs  Lab 08/27/18 0931 08/28/18 0823 08/28/18 0842 08/28/18 1143 08/28/18 1909 08/29/18 0444 08/30/18 0630 08/31/18 0530  WBC 2.8* 2.4*  --   --   --  2.0* 1.3* 1.7*  CREATININE 0.52* 0.45*  --   --   --  0.32* 0.41* 0.33*  LATICACIDVEN  --   --  4.08* 2.02* 1.9  --   --   --     Estimated Creatinine Clearance: 112.7 mL/min (A) (by C-G formula based on SCr of 0.33 mg/dL (L)).    No Known Allergies   Antimicrobials this admission: 9/5 Vancomycin>>9/7, resumed 9/8 >> 9/5 Zosyn>>9/8 9/6 Levofloxacin >>  9/8 Meropenem >>  Dose adjustments this admission: --  Microbiology results: 9/4 BCx (at [facility]): NGTD 9/5 MRSA PCR: negative 9/5 BCx: NGTD 9/6 Urine legionella Ag: IP  9/6 Urine strep pneumo Ag: neg   Thank you for allowing pharmacy to be a part of this patient's care.   Lindell Spar, PharmD, BCPS Pager: [phone] 08/31/2018 9:13 AM

## 2018-08-31 NOTE — Progress Notes (Signed)
Pt currently off BIPAP and on [place], Pt tolerating well at this time.  RT to monitor and assess as needed.

## 2018-08-31 NOTE — Progress Notes (Signed)
Marland Kitchen   HEMATOLOGY/ONCOLOGY INPATIENT PROGRESS NOTE  Date of Service: 08/31/2018  Inpatient Attending: .Doreatha Lew, MD   SUBJECTIVE  Patient continues to have fevers. CT chest with multifocal infiltrates associated with bump in procalcitonin levels and increased WOB now on Bipap. Zosyn changed to meropenem. Discussed change in status with wife and family at bedside. WBC counts/ANC starting to improve with granix.   OBJECTIVE:  Appears fatigued  PHYSICAL EXAMINATION: . Vitals:   08/31/18 1506 08/31/18 1600 08/31/18 1700 08/31/18 1800  BP: 125/65 (!) 121/59 124/71 (!) 122/59  Pulse: (!) 114 (!) 111 (!) 111 (!) 110  Resp:  (!) 35 (!) 33 (!) 33  Temp:  98.5 F (36.9 C)    TempSrc:  Axillary    SpO2: 100% 99% 99% 92%  Weight:      Height:       Filed Weights   08/28/18 1400  Weight: 185 lb 10 oz (84.2 kg)   .Body mass index is 23.83 kg/m. Marland KitchenTemp (24hrs), Avg:99.7 F (37.6 C), Min:98.4 F (36.9 C), Max:102.6 F (39.2 C)   GENERAL:sleepy. No Bipap EYES: normal, conjunctiva are pink and non-injected, sclera clear OROPHARYNX:no exudate, no thrush NECK: supple, LUNGS:scattered rales, tachypnea HEART: s1s2 tachy ABDOMEN: abdomen soft, non-tender, normoactive bowel sounds  Musculoskeletal: trace pedal edema PSYCH: sleepy and tired appearing NEURO: no focal motor/sensory deficits  MEDICAL HISTORY:  Past Medical History:  Diagnosis Date  . Acute MI ([place]) 2014   acute ST elevation MI  . Anemia   . Colon cancer ([place]) 2016   Status post resection of colon mass is well as he panic metastasis.   . Coronary artery disease   . Diabetes mellitus without complication ([place])   . Enlarged prostate    slightly and takes Flomax daily  . Heart disease   . History of blood transfusion   . Hyperlipidemia    takes Lipitor daily  . Hypertension    takes Lisinopril daily  . Lung nodule    left  . Nocturia   . Numbness    left foot  . Pancreatitis   . Peripheral  neuropathy     SURGICAL HISTORY: Past Surgical History:  Procedure Laterality Date  . 1/8 of liver removed    . APPENDECTOMY    . APPLICATION OF CRANIAL NAVIGATION Left 06/04/2018   Procedure: APPLICATION OF CRANIAL NAVIGATION;  Surgeon: Erline Levine, MD;  Location: [facility];  Service: Neurosurgery;  Laterality: Left;  . BIOPSY  05/22/2018   Procedure: BIOPSY;  Surgeon: Carol Ada, MD;  Location: [facility];  Service: Endoscopy;;  . BRAIN SURGERY  06/04/2018  . COLONOSCOPY    . CORONARY ANGIOPLASTY     1 stent  . CORONARY STENT PLACEMENT  06-27-2013  . CRANIOTOMY Left 06/04/2018   Procedure: CRANIOTOMY TUMOR EXCISION with Brainlab;  Surgeon: Erline Levine, MD;  Location: [facility];  Service: Neurosurgery;  Laterality: Left;  CRANIOTOMY TUMOR EXCISION with Brainlab  . ESOPHAGOGASTRODUODENOSCOPY Left 05/22/2018   Procedure: ESOPHAGOGASTRODUODENOSCOPY (EGD);  Surgeon: Carol Ada, MD;  Location: [facility];  Service: Endoscopy;  Laterality: Left;  . HERNIA REPAIR Left 1991  . IR IMAGING GUIDED PORT INSERTION  07/18/2018  . LAPAROSCOPIC RIGHT COLECTOMY  2016   [place]  . LIVER LOBECTOMY Right 09/28/2015    Right partial hepatectomy at [facility]  . LUNG BIOPSY Left 11/28/2016   Procedure: LUNG BIOPSY, left upper lobe;  Surgeon: [name], MD;  Location: [facility];  Service: Thoracic;  Laterality: Left;  .  VIDEO BRONCHOSCOPY WITH ENDOBRONCHIAL NAVIGATION N/A 11/28/2016   Procedure: VIDEO BRONCHOSCOPY WITH ENDOBRONCHIAL NAVIGATION;  Surgeon: [name], MD;  Location: [facility];  Service: Thoracic;  Laterality: N/A;  . VIDEO BRONCHOSCOPY WITH ENDOBRONCHIAL ULTRASOUND N/A 11/28/2016   Procedure: VIDEO BRONCHOSCOPY WITH ENDOBRONCHIAL ULTRASOUND;  Surgeon: [name], MD;  Location: [facility];  Service: Thoracic;  Laterality: N/A;    SOCIAL HISTORY: Social History   Socioeconomic History  . Marital status: Married    Spouse name: [name]  . Number of children: 2  . Years of education:  Bachelor  . Highest education level: Not on file  Occupational History  . Occupation: Company secretary  Social Needs  . Financial resource strain: Not on file  . Food insecurity:    Worry: Not on file    Inability: Not on file  . Transportation needs:    Medical: Not on file    Non-medical: Not on file  Tobacco Use  . Smoking status: Never Smoker  . Smokeless tobacco: Never Used  Substance and Sexual Activity  . Alcohol use: No    Alcohol/week: 0.0 standard drinks  . Drug use: No  . Sexual activity: Not on file  Lifestyle  . Physical activity:    Days per week: Not on file    Minutes per session: Not on file  . Stress: Not on file  Relationships  . Social connections:    Talks on phone: Not on file    Gets together: Not on file    Attends religious service: Not on file    Active member of club or organization: Not on file    Attends meetings of clubs or organizations: Not on file    Relationship status: Not on file  . Intimate partner violence:    Fear of current or ex partner: Not on file    Emotionally abused: Not on file    Physically abused: Not on file    Forced sexual activity: Not on file  Other Topics Concern  . Not on file  Social History Narrative   Live at home with wife, [name]   Patient is a [employer] manager, active and goes to gym   Caffeine: very little soda, occasional tea. 12oz-16oz/per    FAMILY HISTORY: Family History  Problem Relation Age of Onset  . Hypertension Mother   . Heart disease Mother   . Hypertension Father   . Heart disease Father   . Neuropathy Neg Hx     ALLERGIES:  has No Known Allergies.  MEDICATIONS:  Scheduled Meds: . atorvastatin  10 mg Oral q1800  . Chlorhexidine Gluconate Cloth  6 each Topical Daily  . gabapentin  300 mg Oral TID  . insulin aspart  0-5 Units Subcutaneous QHS  . insulin aspart  0-9 Units Subcutaneous TID WC  . [START ON 09/01/2018] insulin aspart  5 Units Subcutaneous TID WC  . insulin glargine  35 Units  Subcutaneous QHS  . lisinopril  2.5 mg Oral Daily  . mouth rinse  15 mL Mouth Rinse BID  . morphine  15 mg Oral Daily  . multivitamin with minerals  1 tablet Oral Daily  . nystatin  5 mL Oral QID  . pantoprazole  40 mg Oral Daily  . predniSONE  40 mg Oral Q breakfast  . protein supplement shake  11 oz Oral TID BM  . sodium chloride flush  3 mL Intravenous Q12H  . tamsulosin  0.4 mg Oral QPC breakfast  . Tbo-filgastrim (GRANIX) SQ  480 mcg Subcutaneous Daily   Continuous Infusions: . sodium chloride Stopped (08/30/18 0808)  . levofloxacin (LEVAQUIN) IV Stopped (08/31/18 1442)  . meropenem (MERREM) IV Stopped (08/31/18 1757)  . vancomycin     PRN Meds:.sodium chloride, acetaminophen **OR** acetaminophen, diphenoxylate-atropine, hydrALAZINE, levalbuterol, LORazepam, magic mouthwash, metoCLOPramide, metoprolol tartrate, morphine injection, nitroGLYCERIN, ondansetron **OR** ondansetron (ZOFRAN) IV, polyethylene glycol, sodium chloride flush, sodium chloride flush, traZODone  REVIEW OF SYSTEMS:    10 Point review of Systems was done is negative except as noted above.   LABORATORY DATA:  I have reviewed the data as listed  . CBC Latest Ref Rng & Units 08/31/2018 08/30/2018 08/29/2018  WBC 4.0 - 10.5 K/uL 1.7(L) 1.3(LL) 2.0(L)  Hemoglobin 13.0 - 17.0 g/dL 8.3(L) 8.6(L) 7.8(L)  Hematocrit 39.0 - 52.0 % 25.0(L) 25.3(L) 23.7(L)  Platelets 150 - 400 K/uL 78(L) 58(L) 49(L)  ANC 1.2k --->1.5k  . CMP Latest Ref Rng & Units 08/31/2018 08/30/2018 08/29/2018  Glucose 70 - 99 mg/dL 131(H) 187(H) 205(H)  BUN 8 - 23 mg/dL 16 10 7(L)  Creatinine 0.61 - 1.24 mg/dL 0.33(L) 0.41(L) 0.32(L)  Sodium 135 - 145 mmol/L 133(L) 131(L) 134(L)  Potassium 3.5 - 5.1 mmol/L 4.0 3.6 4.0  Chloride 98 - 111 mmol/L 98 96(L) 102  CO2 22 - 32 mmol/L _0 Calcium 8.9 - 10.3 mg/dL 7.9(L) 7.7(L) 7.4(L)  Total Protein 6.5 - 8.1 g/dL 5.3(L) - -  Total Bilirubin 0.3 - 1.2 mg/dL 0.4 - -  Alkaline Phos 38 - 126 U/L 249(H) -  -  AST 15 - 41 U/L 29 - -  ALT 0 - 44 U/L 44 - -   Component     Latest Ref Rng & Units 05/21/2018  Campylobacter species     NOT DETECTED NOT DETECTED  Plesimonas shigelloides     NOT DETECTED NOT DETECTED  Salmonella species     NOT DETECTED NOT DETECTED  Yersinia enterocolitica     NOT DETECTED NOT DETECTED  Vibrio species     NOT DETECTED NOT DETECTED  Vibrio cholerae     NOT DETECTED NOT DETECTED  Enteroaggregative E coli (EAEC)     NOT DETECTED NOT DETECTED  Enteropathogenic E coli (EPEC)     NOT DETECTED NOT DETECTED  Enterotoxigenic E coli (ETEC)     NOT DETECTED NOT DETECTED  Shiga like toxin producing E coli (STEC)     NOT DETECTED NOT DETECTED  Shigella/Enteroinvasive E coli (EIEC)     NOT DETECTED NOT DETECTED  Cryptosporidium     NOT DETECTED NOT DETECTED  Cyclospora cayetanensis     NOT DETECTED NOT DETECTED  Entamoeba histolytica     NOT DETECTED NOT DETECTED  Giardia lamblia     NOT DETECTED NOT DETECTED  Adenovirus F40/41     NOT DETECTED NOT DETECTED  Astrovirus     NOT DETECTED NOT DETECTED  Norovirus GI/GII     NOT DETECTED NOT DETECTED  Rotavirus A     NOT DETECTED NOT DETECTED  Sapovirus (I, II, IV, and V)     NOT DETECTED NOT DETECTED   RADIOGRAPHIC STUDIES: I have personally reviewed the radiological images as listed and agreed with the findings in the report. Dg Chest 2 View  Result Date: 08/29/2018 CLINICAL DATA:  Shortness of breath, fever, sepsis. History of MI, hypertension, colonic malignancy. EXAM: CHEST - 2 VIEW COMPARISON:  Chest x-ray and chest CT scan of September 4th 2019 FINDINGS: The lungs are well-expanded. Patchy airspace opacities  bilaterally have markedly increased in conspicuity. There is no pleural effusion. The heart is normal in size. The pulmonary vascularity is indistinct. There is calcification in the wall of the aortic arch. There is soft tissue fullness in the right paratracheal region which is stable. There is  scleroses of the anterior aspect of the right third rib. IMPRESSION: Interval worsening of bilateral airspace opacities worrisome for pneumonia or pulmonary edema or less likely lymphangitic spread of malignancy. There are multiple pulmonary nodules and mediastinal and hilar lymphadenopathy which were well demonstrated on the August 27, 2018 chest CT scan. Thoracic aortic atherosclerosis. Electronically Signed   By: David  Martinique M.D.   On: 08/29/2018 11:54   Dg Chest 2 View  Result Date: 08/27/2018 CLINICAL DATA:  62 year old with current history of metastatic colon cancer for which the patient is undergoing chemotherapy, presenting with two-day history of fatigue, generalized weakness and tachycardia. EXAM: CHEST - 2 VIEW COMPARISON:  Chest x-rays 07/14/2018, 12/06/2016 and earlier. CT chest 02/25/2018, 11/26/2017 and earlier. MRI thoracic spine 07/04/2018 is correlated. FINDINGS: Cardiac silhouette normal in size, unchanged. Thoracic aorta mildly atherosclerotic, unchanged. Hilar and mediastinal contours otherwise unremarkable. RIGHT jugular Port-A-Cath tip in the UPPER RIGHT atrium. Evolving post radiation changes involving the INFERIOR LEFT UPPER LOBE which now has the appearance of linear scarring. Atelectasis involving the RIGHT LOWER LOBE and RIGHT MIDDLE LOBE, with associated mild elevation of the RIGHT hemidiaphragm. No confluent airspace consolidation. Metastasis involving the RIGHT ANTERIOR fourth rib with a mixed lytic and sclerotic appearance, overlying the RIGHT mid lung and RIGHT lung base on the PA image. Mixed lytic and sclerotic metastases involving the mid and UPPER thoracic spine as noted on the recent MRI. IMPRESSION: 1. Mild atelectasis involving the RIGHT LOWER LOBE and RIGHT MIDDLE LOBE. No acute cardiopulmonary disease otherwise. 2. Involving post radiation changes involving the INFERIOR LEFT UPPER LOBE which now has the appearance of linear scarring. 3. Osseous metastatic disease  involving the RIGHT ANTERIOR fourth rib and multiple mid and UPPER thoracic vertebrae. Electronically Signed   By: Evangeline Dakin M.D.   On: 08/27/2018 13:10   Ct Chest Wo Contrast  Result Date: 08/30/2018 CLINICAL DATA:  Fever, fatigue and tachycardia. Undergoing chemotherapy for metastatic colon cancer. EXAM: CT CHEST WITHOUT CONTRAST TECHNIQUE: Multidetector CT imaging of the chest was performed following the standard protocol without IV contrast. COMPARISON:  Chest radiographs obtained yesterday. Chest CTA dated 08/27/2018. Abdomen and pelvis CT dated 02/25/2018. FINDINGS: Cardiovascular: Atheromatous calcifications, including the thoracic aorta. Coronary artery stent. Right jugular catheter tip in the right atrium. Mediastinum/Nodes: Mildly prominent AP window lymph node with a short axis diameter of 9 mm on image number 57 series 2, previously 8 mm. Normal appearing thyroid gland. Lungs/Pleura: Interval extensive patchy opacities throughout both lungs and increased right lower lobe atelectasis. Multiple small bilateral lung nodules are again demonstrated. Small bilateral pleural effusions, increased on the right and new on the left. Upper Abdomen: Faint, oval low density mass in the anterior aspect of the medial segment of the left lobe of the liver, measuring 4.9 x 3.9 cm on image number 118 series 2. An enlarged lymph node or peritoneal mass anterior to that portion of the liver has not changed significantly, measuring 1.9 x 1.1 cm on image number 116 series 2. Musculoskeletal: No significant change in patchy sclerosis in the T1, T4, T5 and T6 vertebral bodies with paraspinal extension on the right at the T4-5 level. There is also patchy sclerosis with extension beyond the  bone margins into the adjacent soft tissues involving the right 4th rib. IMPRESSION: 1. Interval extensive patchy opacities throughout both lungs, compatible with pneumonia. 2. Small bilateral pleural effusions, increased on the right  and new on the left. 3. Stable small bilateral lung nodules compatible with metastatic disease. 4. Increased right lower lobe atelectasis. 5. Faint, oval low density mass in the anterior aspect of the medial segment of the left lobe of the liver, measuring 4.9 x 3.9 cm, compatible with a previously demonstrated metastasis. Other previously demonstrated liver metastases are not well visualized today without intravenous contrast. 6. Stable enlarged lymph node or peritoneal metastasis anterior to the liver on the right. 7. No significant change in metastatic disease involving the T1, T4, T5 and T6 vertebral bodies with paraspinal extension on the right at the T4-5 level. 8. Similar appearing right 4th rib metastasis with extension beyond the rib margins. 9. Minimal increase in size of a probable metastatic AP window lymph node. 10. Calcified aortic atherosclerosis. Aortic Atherosclerosis (ICD10-I70.0). Electronically Signed   By: Claudie Revering M.D.   On: 08/30/2018 16:49   Ct Angio Chest Pe W Or Wo Contrast  Result Date: 08/27/2018 CLINICAL DATA:  62 year old male with metastatic colon cancer. Shortness of breath for 2 days. EXAM: CT ANGIOGRAPHY CHEST WITH CONTRAST TECHNIQUE: Multidetector CT imaging of the chest was performed using the standard protocol during bolus administration of intravenous contrast. Multiplanar CT image reconstructions and MIPs were obtained to evaluate the vascular anatomy. CONTRAST:  173m ISOVUE-370 IOPAMIDOL (ISOVUE-370) INJECTION 76% COMPARISON:  Restaging CT chest abdomen and pelvis 02/25/2018 and earlier. Thoracic and lumbar spine MRI 07/04/2018. FINDINGS: Cardiovascular: Good contrast bolus timing in the pulmonary arterial tree. Respiratory motion in the lower lobes. No focal filling defect identified in the pulmonary arteries to suggest acute pulmonary embolism. Calcified aortic atherosclerosis. Calcified coronary artery atherosclerosis and/or stent. Right chest porta cath is new  since March. No cardiomegaly or pericardial effusion. Mediastinum/Nodes: No mediastinal lymphadenopathy, but there is nonspecific increasing left hilar soft tissue as seen on series 5, image 147, measuring up to 11 millimeters in thickness. The right hilum appears stable and negative. Negative thoracic inlet. Lungs/Pleura: Lower lung volumes.  The major airways are patent. Increased size and number of numerous pulmonary nodules throughout both lungs. Nodules range from punctate to 8-9 millimeters diameter. Superimposed right greater than left costophrenic angle atelectasis. Trace right pleural effusion. Upper Abdomen: Hyperenhancing liver metastases demonstrated on 02/25/2018 are less apparent today, but likely related to contrast timing as with narrow liver windows a large anterior right liver mass now measures at least 6 centimeters diameter (approximately 3 centimeters previously). Negative visible spleen, pancreas, adrenal glands, kidneys and bowel in the upper abdomen. Musculoskeletal: New sclerotic thoracic vertebral lesions at T1 and T4 through T6 levels are compatible with osseous metastatic disease. Confluent vertebral body involvement T4 through T6 is associated with mild pathologic fracture of T5 and bulky right anterolateral extraosseous extension of tumor as demonstrated on series 5, image 97. No obvious epidural tumor or evidence of cord compression at that level. These levels appears similar to the 07/04/2018 MRI. Review of the MIP images confirms the above findings. IMPRESSION: 1.  Negative for acute pulmonary embolus. 2. Progression of metastatic disease since the CTs on 02/25/2018. Numerous bilateral pulmonary nodules, new left hilar lymphadenopathy, enlarged liver tumor, and also multifocal osseous metastatic disease (thoracic spine metastases appear similar to the MRI on 07/04/2018). Electronically Signed   By: HGenevie AnnM.D.   On:  08/27/2018 16:15    ASSESSMENT & PLAN:    1. 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 No BRAFmutation With widely metastatic disease.  2. Sepsis due to pneumonia- multilobar in the an immunosuppressed and nutritional depleted host.. CT chest from 9/7 reviewed. No cultures +ve at this time. Procalcitonin increased. Extent of pneumonia more apparent as WBC counts bounce back. Zosyn switch to meeropenem. On Bipap currently  3. Neutropenia ANC 1.2k due to chemotherapy with sepsis being additional risk factor.  4. Bilateral pneumonia with sepsis.  Blood cultures negative to date  5.  Anemia/thrombocytopenia secondary to chemotherapy and infection  PLAN -labs and CT chest reviewed with patient and family at bedside., -clinical situation concerns given worsening in respiratory status needing Bipap support. -Zosyn switched to Meropenem, also on levofloxacin and Vancomycin. -elevated procalcitonin suggesting again fungal/PJP or viral process though per family report he did have some sinus issues. -Appreciate excellent care by hospitalist team. -No other overt source of infection at this time. -Given significant sepsis now with neutropenia (now resolving) will continue the patient on Granix 40 mcg daily until there is a clear improving trend with ANC more than 1500 for at least 48 hours. -Other supportive cares by hospitalist. -Might need to consider adding growth factor from next cycle of FOLFIRI chemotherapy treatment and possible dose adjustments given infection risks and limited bone marrow reserve in the setting of significant previous chemotherapy and RT. -Dr [name] will f/u from Monday. -Discussed situation with his wife and family at bedside.  I spent 25 minutes counseling the patient face to face. The total time spent in the appointment was 35 minutes and more than 50% was on counseling and  direct patient cares.    Sullivan Lone MD [place] AAHIVMS [facility] Hematology/Oncology Physician [facility]  (Office):       [phone] (Work cell):  [phone] (Fax):           [phone]  08/31/2018 7:31 PM

## 2018-09-01 DIAGNOSIS — A419 Sepsis, unspecified organism: Principal | ICD-10-CM

## 2018-09-01 DIAGNOSIS — C787 Secondary malignant neoplasm of liver and intrahepatic bile duct: Secondary | ICD-10-CM

## 2018-09-01 DIAGNOSIS — J9601 Acute respiratory failure with hypoxia: Secondary | ICD-10-CM

## 2018-09-01 DIAGNOSIS — E878 Other disorders of electrolyte and fluid balance, not elsewhere classified: Secondary | ICD-10-CM | POA: Diagnosis present

## 2018-09-01 DIAGNOSIS — C7931 Secondary malignant neoplasm of brain: Secondary | ICD-10-CM

## 2018-09-01 DIAGNOSIS — Z7952 Long term (current) use of systemic steroids: Secondary | ICD-10-CM

## 2018-09-01 DIAGNOSIS — C189 Malignant neoplasm of colon, unspecified: Secondary | ICD-10-CM

## 2018-09-01 DIAGNOSIS — C7951 Secondary malignant neoplasm of bone: Secondary | ICD-10-CM

## 2018-09-01 DIAGNOSIS — Z9889 Other specified postprocedural states: Secondary | ICD-10-CM

## 2018-09-01 DIAGNOSIS — J189 Pneumonia, unspecified organism: Secondary | ICD-10-CM | POA: Diagnosis present

## 2018-09-01 DIAGNOSIS — Z9049 Acquired absence of other specified parts of digestive tract: Secondary | ICD-10-CM

## 2018-09-01 DIAGNOSIS — Z9221 Personal history of antineoplastic chemotherapy: Secondary | ICD-10-CM

## 2018-09-01 DIAGNOSIS — C7800 Secondary malignant neoplasm of unspecified lung: Secondary | ICD-10-CM

## 2018-09-01 DIAGNOSIS — Z923 Personal history of irradiation: Secondary | ICD-10-CM

## 2018-09-01 DIAGNOSIS — I700 Atherosclerosis of aorta: Secondary | ICD-10-CM | POA: Diagnosis present

## 2018-09-01 LAB — BASIC METABOLIC PANEL
ANION GAP: 10 (ref 5–15)
BUN: 17 mg/dL (ref 8–23)
CO2: 25 mmol/L (ref 22–32)
Calcium: 7.9 mg/dL — ABNORMAL LOW (ref 8.9–10.3)
Chloride: 101 mmol/L (ref 98–111)
Creatinine, Ser: 0.37 mg/dL — ABNORMAL LOW (ref 0.61–1.24)
GFR calc non Af Amer: >60 mL/min (ref >60)
GLUCOSE: 96 mg/dL (ref 70–99)
POTASSIUM: 3.9 mmol/L (ref 3.5–5.1)
Sodium: 136 mmol/L (ref 135–145)

## 2018-09-01 LAB — CBC WITH DIFFERENTIAL/PLATELET
BASOS ABS: 0 10*3/uL (ref 0.0–0.1)
Basophils Relative: 1 %
EOS PCT: 1 %
Eosinophils Absolute: 0 10*3/uL (ref 0.0–0.7)
HCT: 26.3 % — ABNORMAL LOW (ref 39.0–52.0)
HEMOGLOBIN: 8.7 g/dL — AB (ref 13.0–17.0)
LYMPHS ABS: 0.2 10*3/uL (ref 0.7–4.0)
LYMPHS PCT: 6 %
MCH: 30.3 pg (ref 26.0–34.0)
MCHC: 33.1 g/dL (ref 30.0–36.0)
MCV: 91.6 fL (ref 78.0–100.0)
MONO ABS: 0.2 10*3/uL (ref 0.1–1.0)
Monocytes Relative: 6 %
NEUTROS ABS: 2.6 10*3/uL (ref 1.7–7.7)
NRBC: 8 /100{WBCs} — AB
Neutrophils Relative %: 86 %
Platelets: 93 10*3/uL — ABNORMAL LOW (ref 150–400)
RBC: 2.87 MIL/uL — AB (ref 4.22–5.81)
RDW: 18.1 % — ABNORMAL HIGH (ref 11.5–15.5)
WBC: 3 10*3/uL — ABNORMAL LOW (ref 4.0–10.5)

## 2018-09-01 LAB — RESPIRATORY PANEL BY PCR
ADENOVIRUS-RVPPCR: NOT DETECTED
BORDETELLA PERTUSSIS-RVPCR: NOT DETECTED
CORONAVIRUS 229E-RVPPCR: NOT DETECTED
CORONAVIRUS OC43-RVPPCR: NOT DETECTED
Chlamydophila pneumoniae: NOT DETECTED
Coronavirus HKU1: NOT DETECTED
Coronavirus NL63: NOT DETECTED
INFLUENZA A-RVPPCR: NOT DETECTED
INFLUENZA B-RVPPCR: NOT DETECTED
METAPNEUMOVIRUS-RVPPCR: NOT DETECTED
MYCOPLASMA PNEUMONIAE-RVPPCR: NOT DETECTED
PARAINFLUENZA VIRUS 1-RVPPCR: NOT DETECTED
Parainfluenza Virus 2: NOT DETECTED
Parainfluenza Virus 3: NOT DETECTED
Parainfluenza Virus 4: NOT DETECTED
RESPIRATORY SYNCYTIAL VIRUS-RVPPCR: NOT DETECTED
Rhinovirus / Enterovirus: NOT DETECTED

## 2018-09-01 LAB — STREP PNEUMONIAE URINARY ANTIGEN: Strep Pneumo Urinary Antigen: NEGATIVE

## 2018-09-01 LAB — GLUCOSE, CAPILLARY
GLUCOSE-CAPILLARY: 61 mg/dL — AB (ref 70–99)
GLUCOSE-CAPILLARY: 108 mg/dL — AB (ref 70–99)
GLUCOSE-CAPILLARY: 225 mg/dL — AB (ref 70–99)
Glucose-Capillary: 122 mg/dL — ABNORMAL HIGH (ref 70–99)
Glucose-Capillary: 235 mg/dL — ABNORMAL HIGH (ref 70–99)

## 2018-09-01 LAB — CULTURE, BLOOD (SINGLE)
Culture: NO GROWTH
Culture: NO GROWTH
Special Requests: ADEQUATE

## 2018-09-01 LAB — MAGNESIUM: Magnesium: 1.9 mg/dL (ref 1.7–2.4)

## 2018-09-01 LAB — LACTATE DEHYDROGENASE: LDH: 344 U/L — AB (ref 98–192)

## 2018-09-01 LAB — PROCALCITONIN: PROCALCITONIN: 2.28 ng/mL

## 2018-09-01 MED ORDER — METOPROLOL TARTRATE 5 MG/5ML IV SOLN
5.0000 mg | Freq: Once | INTRAVENOUS | Status: AC
  Administered 2018-09-01: 5 mg via INTRAVENOUS
  Filled 2018-09-01: qty 5

## 2018-09-01 MED ORDER — SULFAMETHOXAZOLE-TRIMETHOPRIM 400-80 MG/5ML IV SOLN
420.0000 mg | Freq: Three times a day (TID) | INTRAVENOUS | Status: DC
  Administered 2018-09-01 – 2018-09-12 (×33): 420 mg via INTRAVENOUS
  Filled 2018-09-01 (×34): qty 26.25

## 2018-09-01 MED ORDER — MAGNESIUM SULFATE 2 GM/50ML IV SOLN
2.0000 g | Freq: Once | INTRAVENOUS | Status: AC
  Administered 2018-09-01: 2 g via INTRAVENOUS
  Filled 2018-09-01: qty 50

## 2018-09-01 MED ORDER — DEXTROSE 50 % IV SOLN
INTRAVENOUS | Status: AC
  Administered 2018-09-01: 50 mL
  Filled 2018-09-01: qty 50

## 2018-09-01 MED ORDER — METOPROLOL TARTRATE 5 MG/5ML IV SOLN
2.5000 mg | Freq: Once | INTRAVENOUS | Status: AC
  Administered 2018-09-01: 2.5 mg via INTRAVENOUS
  Filled 2018-09-01: qty 5

## 2018-09-01 MED ORDER — VITAMINS A & D EX OINT
TOPICAL_OINTMENT | CUTANEOUS | Status: AC
  Administered 2018-09-01: 11:00:00
  Filled 2018-09-01: qty 5

## 2018-09-01 MED ORDER — FLUCONAZOLE IN SODIUM CHLORIDE 400-0.9 MG/200ML-% IV SOLN
400.0000 mg | INTRAVENOUS | Status: DC
  Administered 2018-09-01 – 2018-09-09 (×9): 400 mg via INTRAVENOUS
  Filled 2018-09-01 (×10): qty 200

## 2018-09-01 NOTE — Assessment & Plan Note (Signed)
wil lcal ID consult Check Resp Virus Panel Check urine strep Check urine legionella Bronch risky - will end up intubated - can always bronch if he develops resp muscle fatigue and needs intubation as primary event or empiric assumptions do not help situation

## 2018-09-01 NOTE — Consult Note (Signed)
Consultation Note Date: 09/01/2018   Patient Name: Allen Glenn  DOB: Apr 12, 1956  MRN: [id number]  Age / Sex: 62 y.o., male  PCP: [name], MD Referring Physician: Patrecia Pour, Christean Grief, MD  Reason for Consultation: Establishing goals of care and Psychosocial/spiritual support  HPI/Patient Profile: 62 y.o. male  Admitted on  08/28/2018 with   past medical history of diabetes, CAD, hypertension and widely metastatic colon cancer last  chemotherapy 8/27 who presented to the emergency department complaining of fever, fatigue and tachycardia.  Upon ED evaluation patient was found to be hypoxic, elevated lactic acid and febrile.  Patient was admitted started on empiric antibiotics.  Patient continues to decline physically and functionally.  Currently he is requiring BiPAP to maintain O2 SATS  Patient  and his family face ongoing treatment options, advanced directive decisions and end-of-life decisions.  Poor prognosis.  Clinical Assessment and Goals of Care:  This NP [name] reviewed medical records, received report from team, assessed the patient and then meet at the patient's bedside along with his son/Phillip to discuss current medical situation and  EOL wishes disposition and options.  Placed call to wife and await callback  Concept ofPalliative Care was discussed  A  discussion was had today regarding advanced directives.  Concept specific to code status was had.  The difference between a aggressive medical intervention path  and a palliative comfort care path for this patient at this time was had.  Values and goals of care important to patient and family were attempted to be elicited.  Natural trajectory and expectations at EOL were discussed.  Questions and concerns addressed.   Family encouraged to call with questions or concerns.    PMT will continue to support holistically.    SUMMARY OF  RECOMMENDATIONS    Code Status/Advance Care Planning:  Full code- strongly encouraged consideration for DNR/DNR status knowing poor outcomes in similar patietns    Additional Recommendations (Limitations, Scope, Preferences):  Family continues to hope for Improvement and are open to all offered and available medical interventions to prolong life.  Psycho-social/Spiritual:   Desire for further Chaplain cy: no strong community church support support:   Prognosis:   Days to weeks  Discharge Planning: to be determined      Primary Diagnoses: Present on Admission: . Colon cancer s/p right colectomy  . Metastatic colon cancer to liver ([place]) . HTN (hypertension) . Pancytopenia ([place]) . Fever . Sepsis ([place]) . Chemotherapy-induced neutropenia ([place]) . Acute respiratory failure with hypoxia ([place]) . Electrolyte imbalance . Coronary artery disease . Hyperlipidemia . Brain metastases ([place]) . Pulmonary metastasis ([place]) . Other chronic pancreatitis ([place]) . 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 Hypertension . Anemia . Pneumonia . Atherosclerosis of aorta ([place])   I have reviewed the medical record, interviewed the patient and family, and examined the patient. The following aspects are pertinent.  Past Medical History:  Diagnosis Date  . Acute MI ([place]) 2014   acute ST elevation MI  . Anemia   . Colon  cancer [facility]) 2016   Status post resection of colon mass is well as he panic metastasis.   . Coronary artery disease   . Diabetes mellitus without complication ([place])   . Enlarged prostate    slightly and takes Flomax daily  . Heart disease   . History of blood transfusion   . Hyperlipidemia    takes Lipitor daily  . Hypertension    takes Lisinopril daily  . Lung nodule    left  . Nocturia   . Numbness    left foot  . Pancreatitis   . Peripheral neuropathy    Social History   Socioeconomic History  . Marital status: Married    Spouse name: [name]  . Number  of children: 2  . Years of education: Bachelor  . Highest education level: Not on file  Occupational History  . Occupation: Company secretary  Social Needs  . Financial resource strain: Not on file  . Food insecurity:    Worry: Not on file    Inability: Not on file  . Transportation needs:    Medical: Not on file    Non-medical: Not on file  Tobacco Use  . Smoking status: Never Smoker  . Smokeless tobacco: Never Used  Substance and Sexual Activity  . Alcohol use: No    Alcohol/week: 0.0 standard drinks  . Drug use: No  . Sexual activity: Not on file  Lifestyle  . Physical activity:    Days per week: Not on file    Minutes per session: Not on file  . Stress: Not on file  Relationships  . Social connections:    Talks on phone: Not on file    Gets together: Not on file    Attends religious service: Not on file    Active member of club or organization: Not on file    Attends meetings of clubs or organizations: Not on file    Relationship status: Not on file  Other Topics Concern  . Not on file  Social History Narrative   Live at home with wife, [name]   Patient is a [employer] manager, active and goes to gym   Caffeine: very little soda, occasional tea. 12oz-16oz/per   Family History  Problem Relation Age of Onset  . Hypertension Mother   . Heart disease Mother   . Hypertension Father   . Heart disease Father   . Neuropathy Neg Hx    Scheduled Meds: . atorvastatin  10 mg Oral q1800  . Chlorhexidine Gluconate Cloth  6 each Topical Daily  . gabapentin  300 mg Oral TID  . insulin aspart  0-5 Units Subcutaneous QHS  . insulin aspart  0-9 Units Subcutaneous TID WC  . insulin aspart  5 Units Subcutaneous TID WC  . insulin glargine  35 Units Subcutaneous QHS  . lisinopril  2.5 mg Oral Daily  . mouth rinse  15 mL Mouth Rinse BID  . morphine  15 mg Oral Daily  . multivitamin with minerals  1 tablet Oral Daily  . nystatin  5 mL Oral QID  . pantoprazole  40 mg Oral Daily  .  predniSONE  40 mg Oral Q breakfast  . protein supplement shake  11 oz Oral TID BM  . sodium chloride flush  3 mL Intravenous Q12H  . tamsulosin  0.4 mg Oral QPC breakfast   Continuous Infusions: . sodium chloride Stopped (08/30/18 0808)  . sodium chloride 500 mL (08/31/18 2242)  . fluconazole (DIFLUCAN) IV Stopped (09/01/18 1100)  .  levofloxacin (LEVAQUIN) IV Stopped (09/01/18 1500)  . sulfamethoxazole-trimethoprim    . vancomycin Stopped (09/01/18 1212)   PRN Meds:.sodium chloride, sodium chloride, acetaminophen **OR** acetaminophen, diphenoxylate-atropine, hydrALAZINE, levalbuterol, LORazepam, magic mouthwash, metoCLOPramide, metoprolol tartrate, nitroGLYCERIN, ondansetron **OR** ondansetron (ZOFRAN) IV, polyethylene glycol, sodium chloride flush, sodium chloride flush, traZODone Medications Prior to Admission:  Prior to Admission medications   Medication Sig Start Date End Date Taking? Authorizing Provider  acetaminophen (TYLENOL) 500 MG tablet Take 500 mg by mouth every 6 (six) hours as needed (FOR PAIN/HEADACHES.).    Yes [provider]  atorvastatin (LIPITOR) 10 MG tablet Take 10 mg by mouth daily.    Yes [provider]  clotrimazole (MYCELEX) 10 MG troche TAKE 1 TABLET (10 MG TOTAL) BY MOUTH 4 TIMES A DAY. TAKE WHILE ON DECADRON Patient taking differently: Take 10 mg by mouth 4 (four) times daily as needed (thrush).  08/20/18  Yes Ladell Pier, MD  Coenzyme Q10 (COQ10) 200 MG CAPS Take 200 mg by mouth daily.   Yes [provider]  dexamethasone (DECADRON) 2 MG tablet Take 1 tablet (2 mg total) by mouth daily. 08/21/18  Yes Ladell Pier, MD  diphenoxylate-atropine (LOMOTIL) 2.5-0.025 MG tablet Take 2 tablets by mouth 4 (four) times daily as needed for diarrhea or loose stools. 08/12/18  Yes Owens Shark, NP  gabapentin (NEURONTIN) 300 MG capsule Take 1 capsule (300 mg total) by mouth 3 (three) times daily. 1 to 2 PO QHS Patient taking differently:  Take 300 mg by mouth 3 (three) times daily.  06/25/18  Yes Hayden Pedro, PA-C  HUMALOG KWIKPEN 100 UNIT/ML KiwkPen Inject 2-15 Units into the skin See admin instructions. 121-150=2 UNITS, 151-200=3 UNITS, 201-250=5 UNITS, 251-300=8 UNITS, 301-350=11 UNITS, 351-400 = 15 UNITS Pt takes meal coverage: 1 unit/5 grams of carbs 05/26/18  Yes [provider]  insulin glargine (LANTUS) 100 UNIT/ML injection Inject 22 Units into the skin at bedtime.    Yes [provider]  lidocaine-prilocaine (EMLA) cream Apply 1 application topically as needed. Patient taking differently: Apply 1 application topically as needed (port access).  07/18/18  Yes Ladell Pier, MD  lisinopril (PRINIVIL,ZESTRIL) 2.5 MG tablet Take 2.5 mg by mouth daily. 02/16/15  Yes [provider]  LORazepam (ATIVAN) 0.5 MG tablet Take 1 tablet (0.5 mg total) by mouth every 8 (eight) hours as needed for anxiety (prn nausea). Take 1 tablet by mouth before bedtime as needed. Patient taking differently: Take 0.5 mg by mouth every 8 (eight) hours as needed (nausea).  08/19/18  Yes Ladell Pier, MD  magic mouthwash SOLN Take 5 mLs by mouth 4 (four) times daily as needed for mouth pain. Patient taking differently: Take 5 mLs by mouth 3 (three) times daily.  07/28/18  Yes Tanner, Lyndon Code., PA-C  metoCLOPramide (REGLAN) 10 MG tablet Take 1 tablet (10 mg total) by mouth 3 (three) times daily before meals. Patient taking differently: Take 10 mg by mouth 2 (two) times daily as needed for nausea.  07/28/18  Yes Tanner, Lyndon Code., PA-C  mirtazapine (REMERON) 7.5 MG tablet Take 1 tablet (7.5 mg total) by mouth at bedtime. 08/13/18  Yes Ladell Pier, MD  morphine (MS CONTIN) 15 MG 12 hr tablet TAKE 1 TABLET (15 MG TOTAL) BY MOUTH EVERY 12 HOURS. Patient taking differently: Take 15 mg by mouth every 12 (twelve) hours as needed for pain.  07/17/18  Yes Hayden Pedro, PA-C  nitroGLYCERIN (NITROSTAT) 0.4 MG SL tablet  Place  0.4 mg under the tongue every 5 (five) minutes as needed for chest pain. CALL 911 IF PAIN PERSISTS 11/24/14  Yes [provider]  omeprazole (PRILOSEC) 20 MG capsule Take 20 mg by mouth daily.   Yes [provider]  ondansetron (ZOFRAN-ODT) 4 MG disintegrating tablet Take 1 tablet (4 mg total) by mouth every 8 (eight) hours as needed for nausea or vomiting. 07/18/18  Yes Oretha Milch D, MD  promethazine (PHENERGAN) 12.5 MG tablet Take 1 tablet (12.5 mg total) by mouth every 6 (six) hours as needed for nausea or vomiting. 07/24/18  Yes Ladell Pier, MD  tamsulosin (FLOMAX) 0.4 MG CAPS capsule Take 0.4 mg by mouth daily after breakfast. 30 minutes after breakfast 02/24/15  Yes [provider]  trolamine salicylate (ASPERCREME) 10 % cream Apply 1 application topically 2 (two) times daily as needed (FOR BACK PAIN.).   Yes [provider]  Continuous Blood Gluc Sensor (FREESTYLE LIBRE 14 DAY SENSOR) MISC 1 application by Does not apply route every 14 (fourteen) days. 06/06/18   Mikhail, Velta Addison, DO  dronabinol (MARINOL) 2.5 MG capsule Take 1 capsule (2.5 mg total) by mouth 2 (two) times daily before lunch and supper. Patient not taking: Reported on 08/28/2018 07/18/18   Oretha Milch D, MD  Insulin Pen Needle 31G X 5 MM MISC Use with insulin pen. 05/26/18   Mikhail, Velta Addison, DO  methocarbamol (ROBAXIN) 500 MG tablet Take 500 mg by mouth every 8 (eight) hours as needed. 07/08/18   [provider]  morphine (MS CONTIN) 30 MG 12 hr tablet Take 1 tablet (30 mg total) by mouth 3 (three) times daily. Patient not taking: Reported on 07/28/2018 07/02/18   Ladell Pier, MD  morphine (MSIR) 15 MG tablet Take 1 tablet (15 mg total) by mouth every 4 (four) hours as needed for severe pain. Patient not taking: Reported on 08/28/2018 07/02/18   Ladell Pier, MD  prochlorperazine (COMPAZINE) 5 MG tablet Take 1 tablet (5 mg total) by mouth every 6 (six) hours as needed for nausea or  vomiting. Patient not taking: Reported on 08/27/2018 08/19/18   Ladell Pier, MD   No Known Allergies Review of Systems  Unable to perform ROS: Acuity of condition    Physical Exam  Constitutional: He appears well-developed. He appears lethargic. He appears ill.  Cardiovascular: Tachycardia present.  Pulmonary/Chest: Tachypnea noted.  Neurological: He appears lethargic.  Skin: Skin is warm and dry.    Vital Signs: BP 129/66   Pulse (!) 117   Temp 98.8 F (37.1 C) (Axillary)   Resp (!) 31   Ht 6\' 2"  (1.88 m)   Wt 84.2 kg   SpO2 97%   BMI 23.83 kg/m  Pain Scale: 0-10   Pain Score: 0-No pain   SpO2: SpO2: 97 % O2 Device:SpO2: 97 % O2 Flow Rate: .O2 Flow Rate (L/min): 15 L/min  IO: Intake/output summary:   Intake/Output Summary (Last 24 hours) at 09/01/2018 1628 Last data filed at 09/01/2018 1500 Gross per 24 hour  Intake 996.16 ml  Output 1000 ml  Net -3.84 ml    LBM: Last BM Date: 09/01/18 Baseline Weight: Weight: 84.2 kg Most recent weight: Weight: 84.2 kg     Palliative Assessment/Data: 30 % at best   Discussed with Dr Quincy Simmonds and Worthy Flank PA-C  Time In: 1600 Time Out: 1700 Time Total: 60 minutes Greater than 50%  of this time was spent counseling and coordinating care related to  the above assessment and plan.  Signed by: [name], NP   Please contact Palliative Medicine Team phone at [phone] for questions and concerns.  For individual provider: See Shea Evans

## 2018-09-01 NOTE — Assessment & Plan Note (Addendum)
?   Onset of infiltrates with immune reconstitution with Granix Presenting Features all point to bacterial process  Plan Re-Start BiPAP 2h on /2h off day time+ QHS bipap (can reduce incidence of intubation) Intubate if worse Bronch BAL if he gets intubated (would avoid intubation for sake of bronch) Ok for light lunch (no hx of nausea, vomit) but after that clears

## 2018-09-01 NOTE — Progress Notes (Signed)
IP PROGRESS NOTE  Subjective:   Allen Glenn has intermittent fever.  He has developed progressive respiratory failure.  He is now on BiPAP.  His wife and daughter are at the bedside.  He complains of a dry mouth.  Objective: Vital signs in last 24 hours: Blood pressure (!) 115/45, pulse (!) 108, temperature 98.8 F (37.1 C), temperature source Axillary, resp. rate (!) 24, height 6' 2"  (1.88 m), weight 185 lb 10 oz (84.2 kg), SpO2 98 %.  Intake/Output from previous day: 09/08 0701 - 09/09 0700 In: 1029.5 [I.V.:64.8; IV Piggyback:964.6] Out: 1225 [Urine:1225]  Physical Exam:  HEENT: BIPAP mask in place Lungs: Anterior exam reveals decreased breath sounds at the right compared to the left chest Cardiac: Regular rate and rhythm Abdomen: Nontender, no hepatosplenomegaly Extremities: No leg edema   Portacath/PICC-without erythema  Lab Results: Recent Labs    08/31/18 0530 09/01/18 0555  WBC 1.7* 3.0*  HGB 8.3* 8.7*  HCT 25.0* 26.3*  PLT 78* 93*   ANC 2.6 BMET Recent Labs    08/31/18 0530 09/01/18 0555  NA 133* 136  K 4.0 3.9  CL 98 101  CO2 24 25  GLUCOSE 131* 96  BUN 16 17  CREATININE 0.33* 0.37*  CALCIUM 7.9* 7.9*    Lab Results  Component Value Date   CEA1 71.41 (H) 08/19/2018    Studies/Results: Ct Chest Wo Contrast  Result Date: 08/30/2018 CLINICAL DATA:  Fever, fatigue and tachycardia. Undergoing chemotherapy for metastatic colon cancer. EXAM: CT CHEST WITHOUT CONTRAST TECHNIQUE: Multidetector CT imaging of the chest was performed following the standard protocol without IV contrast. COMPARISON:  Chest radiographs obtained yesterday. Chest CTA dated 08/27/2018. Abdomen and pelvis CT dated 02/25/2018. FINDINGS: Cardiovascular: Atheromatous calcifications, including the thoracic aorta. Coronary artery stent. Right jugular catheter tip in the right atrium. Mediastinum/Nodes: Mildly prominent AP window lymph node with a short axis diameter of 9 mm on image number  57 series 2, previously 8 mm. Normal appearing thyroid gland. Lungs/Pleura: Interval extensive patchy opacities throughout both lungs and increased right lower lobe atelectasis. Multiple small bilateral lung nodules are again demonstrated. Small bilateral pleural effusions, increased on the right and new on the left. Upper Abdomen: Faint, oval low density mass in the anterior aspect of the medial segment of the left lobe of the liver, measuring 4.9 x 3.9 cm on image number 118 series 2. An enlarged lymph node or peritoneal mass anterior to that portion of the liver has not changed significantly, measuring 1.9 x 1.1 cm on image number 116 series 2. Musculoskeletal: No significant change in patchy sclerosis in the T1, T4, T5 and T6 vertebral bodies with paraspinal extension on the right at the T4-5 level. There is also patchy sclerosis with extension beyond the bone margins into the adjacent soft tissues involving the right 4th rib. IMPRESSION: 1. Interval extensive patchy opacities throughout both lungs, compatible with pneumonia. 2. Small bilateral pleural effusions, increased on the right and new on the left. 3. Stable small bilateral lung nodules compatible with metastatic disease. 4. Increased right lower lobe atelectasis. 5. Faint, oval low density mass in the anterior aspect of the medial segment of the left lobe of the liver, measuring 4.9 x 3.9 cm, compatible with a previously demonstrated metastasis. Other previously demonstrated liver metastases are not well visualized today without intravenous contrast. 6. Stable enlarged lymph node or peritoneal metastasis anterior to the liver on the right. 7. No significant change in metastatic disease involving the T1, T4, T5 and  T6 vertebral bodies with paraspinal extension on the right at the T4-5 level. 8. Similar appearing right 4th rib metastasis with extension beyond the rib margins. 9. Minimal increase in size of a probable metastatic AP window lymph node. 10.  Calcified aortic atherosclerosis. Aortic Atherosclerosis (ICD10-I70.0). Electronically Signed   By: Claudie Revering M.D.   On: 08/30/2018 16:49    Medications: I have reviewed the patient's current medications.  Assessment/Plan:  1. 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 No BRAFmutation  Liver biopsy 05/04/2015-metastatic adenocarcinoma consistent with a colon primary ? Staging PET scan 06/08/2015-isolated segment 4A liver lesion ? Initiation of adjuvant FOLFOX 06/13/2015 ? Restaging CT 08/09/2015 revealed a slight decrease in a borderline ileocolic node, decrease in the hepatic dome metastasis, no new lesions ? Liver resection 09/28/2015-pathology consistent with metastatic colon cancer, negative margins ? Adjuvant FOLFOX resumed 11/08/2015, oxaliplatin eliminated beginning 11/22/2015 secondary to neuropathy. He completed adjuvant chemotherapy 02/16/2016 ? Restaging chest CT 11/08/2016, compared to 05/07/2016 revealed a new 9 mm left upper lobe nodule, stable 2.2 cm right hepatic lesion ? PET scan 11/19/2016 confirmed a hypermetabolic left upper lobe nodule, hypermetabolic left paratracheal and pericardiac lymph nodes, and hypermetabolism associated with the hypoattenuating lesion in the dome of the liver ? Status post EBUSbiopsies of the left lingula nodule and a level 4Lnode on 11/28/2016-no evidence of malignancy ? CT chest 02/11/2017-increase in size of the left pulmonary nodule and epicardial lymph node ? Status post SBRT 2 the left lung nodule and mediastinum completed 03/14/2017 ? CTs 07/01/2017-new 9 mm focus along the right liver capsule, stable left subcapsular liver lesion, radiation change at site of left upper lobe nodule, stable pericardiallymph node ? CTs 11/25/2017-new 5 mm lingular nodule, enlargement of capsular-based right liver  lesion, new capsular based right liver lesion capsular lesion at the hepatic dome ? CTs 02/25/2018-enlargement of small lung nodules, liver lesions and small mesenteric lymph node ? CT abdomen/pelvis 05/15/2018-new lower lobe pulmonary nodules, enlargement of liver lesions and right lower quadrant soft tissue nodules ? 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 Brain MRI 05/26/2018-3.5 similar left posterior fossa mass, 4 additional subcentimeter enhancing brain lesions-3 cerebellar, 1 left thalamic ? SRS to all 5 brain lesions 06/02/2018 ? Surgical excision of left cerebellar lesion 06/04/2018, adenocarcinoma consistent with colorectal primary ? MRI lumbar spine 06/18/2018-extensive metastases to the lumbar spine, sacrum, T12, and pelvis, pathologic fracture of L3 ? Initiation of radiation from T12-S2 06/30/2018, completed 07/11/2018 ? Cycle 1 FOLFIRI 08/19/2018 2. Coronary artery disease status post a myocardial infarction in 2014  3. Hypertension  4. Hyperlipidemia  5. Diabetes  6. Admission 05/20/2018 with nausea/vomitingsecondary to a cerebellar metastasis  7.Oral candidiasis 06/11/2018-started on Mycelex troches  8. 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  Admission 08/28/2018 with a febrile illness, likely pneumonia  10.  Anemia/thrombocytopenia secondary to chemotherapy and infection-the white count and platelets are recovering   Allen Glenn was admitted with a febrile illness.  He has developed progressive respiratory failure.  The clinical presentation is consistent with pneumonia.  Blood cultures remain negative.  He develops anemia/thrombus cytopenia and mild neutropenia following the first cycle of FOLFIRI.  The platelet count is recovering.  The neutrophil count is in the normal range today.  I will discontinue G-CSF .  I began a discussion  with Ms. Mohamud regarding indication  for intubation if his respiratory status worsens.  This discussion  will continue over the next few days.  Recommendations: 1.  Pulmonary medicine consult 2.  Continue antibiotics as recommended by internal medicine and pulmonary medicine 3.  Discontinue G-CSF   LOS: 4 days   Betsy Coder, MD   09/01/2018, 1:44 PM

## 2018-09-01 NOTE — Progress Notes (Signed)
Inpatient Diabetes Program Recommendations  AACE/ADA: New Consensus Statement on Inpatient Glycemic Control (2015)  Target Ranges:  Prepandial:   less than 140 mg/dL      Peak postprandial:   less than 180 mg/dL (1-2 hours)      Critically ill patients:  140 - 180 mg/dL   Lab Results  Component Value Date   GLUCAP 122 (H) 09/01/2018   HGBA1C 7.9 (H) 05/30/2018    Review of Glycemic Control  Hypoglycemia this am. Needs adjustment in basal insulin.   Inpatient Diabetes Program Recommendations:     Decrease Lantus to 30 units QHS  Continue to follow.  Thank you. Lorenda Peck, RD, LDN, CDE Inpatient Diabetes Coordinator [phone]

## 2018-09-01 NOTE — Progress Notes (Addendum)
PT Cancellation Note  Patient Details Name: Allen Glenn MRN: [id number] DOB: 1956/02/19   Cancelled Treatment:    Reason Eval/Treat Not Completed: Medical issues which prohibited therapy per PN:[id number] on BiPAP, running fever. [place] PT [phone]    [name] 09/01/2018, 10:34 AM

## 2018-09-01 NOTE — Assessment & Plan Note (Signed)
[place]

## 2018-09-01 NOTE — Care Management (Signed)
  Discharge Readiness Milestones  MCG Used: pna  Hospital Day:4  Discharge Readiness Milestones  62 year old male with past medical history of diabetes, CAD, hypertension and metastatic colon cancer on chemotherapy 8/27 who presented to the emergency department complaining of fever, fatigue and tachycardia.  Upon ED evaluation patient was found to be hypoxic, elevated lactic acid and febrile.  Patient was admitted with working diagnosis of sepsis of unknown origin and started on empiric antibiotics.  Subjective: Patient seen and examined, continues to have hard time breathing, RR between 30-40. Remains tachycardic. Febrile this morning. Denies chest pain, palpitations and dizziness.  Family at bedside all questions answered.   Assessment & Plan: Sepsis due to pneumonia Bilateral PNA, not significant improvement with current antibiotic therapy. CT chest performed 9/7 shows extensive opacities throughout both lungs.  Small bilateral pleural effusion and bilateral lung nodule compatible with metastatic disease.  Given no significant clinical improvement will switch Zosyn to Merrem, resume vancomycin and continue Levaquin.  Patient started on prednisone 40 mg daily.  Continue supportive treatment with albuterol as needed and mucolytic's.  Acute respiratory failure with hypoxia Multifactorial due to pneumonia and fluid overload from aggressive diuresis.  Patient have diuresed very well about 5 L of urine output.  Currently breathing has not improved despite being euvolemic, ABG obtained at signs of CO2 retention.  Echocardiogram with grade 1 diastolic dysfunction, EF 65 to 70% no wall motion abnormalities.  Mild pulmonary hypertension.  Will place on BiPAP for rate control, continue to treat underlying causes.  Will hold Lasix today. Identified Barriers: Date/Name case discussed with attending MD (if applicable):  Date/name case discussed with supervisor (if applicable):  Date/name case referred  to physician advisor (if applicable)   Concurrent Review - Discharge Readiness Milestones, Not Met

## 2018-09-01 NOTE — Progress Notes (Signed)
Pharmacy Antibiotic Note  Allen Glenn is a 62 y.o. male admitted on 08/28/2018 with sepsis secondary to pneumonia. Patient initially placed on Vancomycin and Zosyn, then Levofloxacin added 9/6 for atypical coverage. Vancomycin d/c'ed yesterday due to negative MRSA PCR. Patient now neutropenic, spiking fevers, so MD has resumed Vancomycin and Zosyn broadened to Meropenem. Pharmacy consulted to assist with dosing of antibiotics.   9/9: ID consulted, adding IV Bactrim for Pneumocystis Pneumonia, Pharmacy to dose SCr 0.37, unchanged CrCl > 100 ml/min   Plan:  Begin Bactrim 5mg /kg IV q8h (15mg /kg/day of trimethoprim)  Monitor renal function, cultures, clinical course.   Height: 6\' 2"  (188 cm) Weight: 185 lb 10 oz (84.2 kg) IBW/kg (Calculated) : 82.2  Temp (24hrs), Avg:99.4 F (37.4 C), Min:97.7 F (36.5 C), Max:102.1 F (38.9 C)  Recent Labs  Lab 08/28/18 0823 08/28/18 0842 08/28/18 1143 08/28/18 1909 08/29/18 0444 08/30/18 0630 08/31/18 0530 09/01/18 0555  WBC 2.4*  --   --   --  2.0* 1.3* 1.7* 3.0*  CREATININE 0.45*  --   --   --  0.32* 0.41* 0.33* 0.37*  LATICACIDVEN  --  4.08* 2.02* 1.9  --   --   --   --     Estimated Creatinine Clearance: 112.7 mL/min (A) (by C-G formula based on SCr of 0.37 mg/dL (L)).    No Known Allergies   Antimicrobials this admission: 9/5 Vancomycin >> 9/7, resumed 9/8 >> 9/5 Zosyn >> 9/8 9/6 Levofloxacin >>  9/8 Meropenem >> 9/9 Diflucan >> 9/9 Bactrim >>   Dose adjustments this admission: --   Microbiology results: 9/4 BCx (at [facility]): NGTD 9/5 MRSA PCR: negative  9/5 BCx: NGTD 9/6 Urine legionella Ag: negative 9/6 Urine strep pneumo Ag: neg 9/9 Respiratory virus panel:   Thank you for allowing pharmacy to be a part of this patient's care.   Peggyann Juba, PharmD, BCPS Pager: [phone] 08/31/2018 9:13 AM

## 2018-09-01 NOTE — Consult Note (Signed)
[facility] for Infectious Disease    Date of Admission:  08/28/2018    Day 5 vancomycin        Day 4 levofloxacin        Day 2 meropenem        Day 1 fluconazole       Reason for Consult: Pneumonia in the setting of recent chemotherapy    Referring Provider: Dr. Brand Males  Assessment: He has developed fever and rapidly progressive bilateral pulmonary infiltrates and hypoxia following recent chemotherapy.  He has been on dexamethasone for the past 3 months.  I am concerned about the possibility of pneumocystis pneumonia, especially given his elevated LDH.  I feel that current antimicrobial therapy is broader than needed.  I do not feel that he is high risk for Pseudomonas and more drug-resistant gram-negative rods.  I will stop meropenem now.  His MRSA PCR screen is negative.  I will consider stopping vancomycin soon.  Plan: 1. Continue vancomycin, levofloxacin and fluconazole 2. Discontinue meropenem 3. Start trimethoprim sulfamethoxazole 4. 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 atorvastatin  10 mg Oral q1800  . Chlorhexidine Gluconate Cloth  6 each Topical Daily  . gabapentin  300 mg Oral TID  . insulin aspart  0-5 Units Subcutaneous QHS  . insulin aspart  0-9 Units Subcutaneous TID WC  . insulin aspart  5 Units Subcutaneous TID WC  . insulin glargine  35 Units  Subcutaneous QHS  . lisinopril  2.5 mg Oral Daily  . mouth rinse  15 mL Mouth Rinse BID  . morphine  15 mg Oral Daily  . multivitamin with minerals  1 tablet Oral Daily  . nystatin  5 mL Oral QID  . pantoprazole  40 mg Oral Daily  . predniSONE  40 mg Oral Q breakfast  . protein supplement shake  11 oz Oral TID BM  . sodium chloride flush  3 mL Intravenous Q12H  . tamsulosin  0.4 mg Oral QPC breakfast   Continuous Infusions: . sodium chloride Stopped (08/30/18 0808)  . sodium chloride 500 mL (08/31/18 2242)  . fluconazole (DIFLUCAN) IV Stopped (09/01/18 1100)  . levofloxacin (LEVAQUIN) IV Stopped (09/01/18 1500)  . meropenem (MERREM) IV Stopped (09/01/18 0936)  . vancomycin Stopped (09/01/18 1212)   PRN Meds:.sodium chloride, sodium chloride, acetaminophen **OR** acetaminophen, diphenoxylate-atropine, hydrALAZINE, levalbuterol, LORazepam, magic mouthwash, metoCLOPramide, metoprolol tartrate, nitroGLYCERIN, ondansetron **OR** ondansetron (ZOFRAN) IV, polyethylene glycol, sodium chloride flush, sodium chloride flush, traZODone  HPI: Allen Glenn is a 62 y.o. male who was diagnosed with stage IV colon cancer with liver metastases in 2016.  He underwent right hemicolectomy and started on chemotherapy.  He was found to have a lung mass as well and underwent radiation therapy.  He has known bone metastases as well.  In June he was found  to have a brain metastases and underwent craniectomy.  He was started on dexamethasone at that time and has been taking it ever since.  He received his first dose of Folfiri chemotherapy on 08/19/2018.  In the week after chemotherapy he became progressively weaker and developed persistent tachycardia.  Fevers were first noted on 08/28/2018 leading to admission.  He is short of breath and has had rapidly progressive bilateral pulmonary infiltrates and hypoxia.  He has been placed on progressively broader, empiric anti-microbial therapy but has had persistent  fevers.  As far as he and his wife can recall he is only had one brief course of antibiotics for sinus infection within the past year.  He has no known history of exposure to tuberculosis.  He has not had any recent travel.  He has a dog but no other unusual animal exposures in the past year.   Review of Systems: Review of Systems  Constitutional: Positive for chills, diaphoresis, fever and malaise/fatigue. Negative for weight loss.  HENT: Negative for congestion and sore throat.   Respiratory: Positive for shortness of breath. Negative for cough, hemoptysis and sputum production.   Cardiovascular: Negative for chest pain.  Gastrointestinal: Negative for abdominal pain, diarrhea, nausea and vomiting.  Genitourinary: Negative for dysuria.  Skin: Negative for rash.  Neurological: Negative for headaches.    Past Medical History:  Diagnosis Date  . Acute MI ([place]) 2014   acute ST elevation MI  . Anemia   . Colon cancer ([place]) 2016   Status post resection of colon mass is well as he panic metastasis.   . Coronary artery disease   . Diabetes mellitus without complication ([place])   . Enlarged prostate    slightly and takes Flomax daily  . Heart disease   . History of blood transfusion   . Hyperlipidemia    takes Lipitor daily  . Hypertension    takes Lisinopril daily  . Lung nodule    left  . Nocturia   . Numbness    left foot  . Pancreatitis   . Peripheral neuropathy     Social History   Tobacco Use  . Smoking status: Never Smoker  . Smokeless tobacco: Never Used  Substance Use Topics  . Alcohol use: No    Alcohol/week: 0.0 standard drinks  . Drug use: No    Family History  Problem Relation Age of Onset  . Hypertension Mother   . Heart disease Mother   . Hypertension Father   . Heart disease Father   . Neuropathy Neg Hx    No Known Allergies  OBJECTIVE: Blood pressure 129/66, pulse (!) 117, temperature 98.8 F (37.1 C), temperature source Axillary, resp. rate  (!) 31, height 6\' 2"  (1.88 m), weight 84.2 kg, SpO2 97 %.  Physical Exam  Constitutional: He is oriented to person, place, and time.  He is propped up in bed with a nonrebreather oxygen mask on.  His wife, other family and many friends are present.  He is alert but very weak.  HENT:  Mouth/Throat: No oropharyngeal exudate.  Eyes: Conjunctivae are normal.  Neck: Neck supple.  Cardiovascular: Regular rhythm and normal heart sounds.  No murmur heard. He is tachycardic.  Pulmonary/Chest: Effort normal. He has no wheezes. He has no rales.  His lungs are clear anteriorly.    Abdominal: Soft. He exhibits no distension. There is no tenderness.  Musculoskeletal: Normal range of motion. He exhibits no edema or tenderness.  Neurological: He is alert and oriented  to person, place, and time.  Skin: No rash noted.  Psychiatric: He has a normal mood and affect.    Lab Results Lab Results  Component Value Date   WBC 3.0 (L) 09/01/2018   HGB 8.7 (L) 09/01/2018   HCT 26.3 (L) 09/01/2018   MCV 91.6 09/01/2018   PLT 93 (L) 09/01/2018    Lab Results  Component Value Date   CREATININE 0.37 (L) 09/01/2018   BUN 17 09/01/2018   NA 136 09/01/2018   K 3.9 09/01/2018   CL 101 09/01/2018   CO2 25 09/01/2018    Lab Results  Component Value Date   ALT 44 08/31/2018   AST 29 08/31/2018   ALKPHOS 249 (H) 08/31/2018   BILITOT 0.4 08/31/2018     Microbiology: Recent Results (from the past 240 hour(s))  Culture, Blood     Status: None   Collection Time: 08/27/18  9:51 AM  Result Value Ref Range Status   Specimen Description BLOOD BLOOD RIGHT HAND PORTA CATH  Final   Special Requests   Final    BOTTLES DRAWN AEROBIC AND ANAEROBIC Blood Culture adequate volume   Culture   Final    NO GROWTH 5 DAYS Performed at [facility], [place] [address], [place], [place] [address]    Report Status 09/01/2018 FINAL  Final  Culture, Blood     Status: None   Collection Time: 08/27/18 10:10 AM  Result  Value Ref Range Status   Specimen Description   Final    BLOOD RIGHT HAND Performed at [facility], [place] [address], [place], [place] [address]    Special Requests   Final    NONE Performed at [facility], [place] [address], [place], [place] [address]    Culture   Final    NO GROWTH 5 DAYS Performed at [facility], [place] [address] [place], [place] [address]    Report Status 09/01/2018 FINAL  Final  MRSA PCR Screening     Status: None   Collection Time: 08/28/18  1:45 PM  Result Value Ref Range Status   MRSA by PCR NEGATIVE NEGATIVE Final    Comment:        The GeneXpert MRSA Assay (FDA approved for NASAL specimens only), is one component of a comprehensive MRSA colonization surveillance program. It is not intended to diagnose MRSA infection nor to guide or monitor treatment for MRSA infections. Performed at [facility], [place] [address] [place], [place] [address]   Culture, blood (x 2)     Status: None (Preliminary result)   Collection Time: 08/28/18  7:09 PM  Result Value Ref Range Status   Specimen Description   Final    BLOOD LEFT HAND Performed at [facility] [address], [place], [place] [address]    Special Requests   Final    BOTTLES DRAWN AEROBIC AND ANAEROBIC Blood Culture adequate volume Performed at [facility] [address], [place], [place] [address]    Culture   Final    NO GROWTH 4 DAYS Performed at [facility], [place] [address], [place], [place] [address]    Report Status PENDING  Incomplete  Culture, blood (x 2)     Status: None (Preliminary result)   Collection Time: 08/28/18  7:15 PM  Result Value Ref Range Status   Specimen Description   Final    BLOOD RIGHT HAND Performed at [facility]  [place] [address] [place], [place] [address]    Special Requests   Final    BOTTLES DRAWN AEROBIC AND ANAEROBIC  Blood Culture adequate volume Performed at [facility] [address] [place], [place] [address]    Culture   Final    NO GROWTH 4 DAYS Performed at [facility], [place] [address], [place], [place] [address]    Report Status PENDING  Incomplete    Michel Bickers, MD [facility] for Infectious [place] [phone] pager   [phone] cell 09/01/2018, 3:21 PM

## 2018-09-01 NOTE — Progress Notes (Signed)
PROGRESS NOTE [facility]   Allen Glenn   ZJI:[id number] DOB: 02-27-1956  DOA: 08/28/2018 PCP: [name], MD   Brief Narrative:  Allen Glenn is a 62 year old male with past medical history of diabetes, CAD, hypertension and metastatic colon cancer on chemotherapy 8/27 who presented to the emergency department complaining of fever, fatigue and tachycardia.  Upon ED evaluation patient was found to be hypoxic, elevated lactic acid and febrile.  Patient was admitted with working diagnosis of sepsis of unknown origin and started on empiric antibiotics.  Subjective: Patient seen and examined, breathing remains rapid around 40's. Febrile again this morning. Patient state that he is feeling somewhat improved from yesterday. Family at bedside.   Assessment & Plan: Sepsis due to pneumonia Bilateral PNA, not significant improvement with current antibiotic therapy. CT chest performed 9/7 shows extensive opacities throughout both lungs.  Small bilateral pleural effusion and bilateral lung nodule compatible with metastatic disease.  Patient currently on Merrem, vancomycin, Levaquin and prednisone.  Continues to spike fever, seems to be cyclical only in the mornings. ?  Fever from malignancy.  Will add Diflucan to cover fungal infection. ? PCP, LDH elevated however this can be previously high due to malignancy.  Case discussed with PCCM for possible bronchoscopy, they will see patient and make recommendations.  Legionella and strep antigens have been negative. ID consulted. Neutropenia improving   Acute respiratory failure with hypoxia Multifactorial due to pneumonia and fluid overload from aggressive diuresis. Patient have diuresed very well about 5 L of urine output. Now seems to be euvolemic. Echocardiogram with grade 1 diastolic dysfunction, EF 65 to 70% no wall motion abnormalities. Mild pulmonary hypertension.  Continue BiPAP for rate control, discussed possible intubation as rate remains  rapid, patient ok with intubation. Dicussed with PCCM   Thrombocytopenia -  In setting of chemotherapy and sepsis. No signs of bleeding. Continue to monitor.   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 Management per Oncology   HTN BP remains stable, continue lisinopril.  Sinus tachycardia  Remains tachycardic, echo with no abnormalities.  Continue  IV metoprolol 2.5 mg as needed for heart rate above 130 bpm  Type 2 DM   A1C 7.9. CBG's in the low side, patient no eating much will d/c lantus continue sliding scale and meal coverage for now. Monitor CBG's   Goals of cares Patient wants to fight for now, but with multiple questions about prognosis. I have explained that if he get intubated there is a chance that he may remains on a breathing machine. He is ok with short term intubation but would not like to be dependent on a machine. Palliative care consulted.   DVT prophylaxis: SCD's Code Status: Full Code  Family Communication: Wife at bedside  Disposition Plan: Continue to monitor in SDU   Consultants:   Oncology   Procedures:   None   Antimicrobials: Anti-infectives (From admission, onward)   Start     Dose/Rate Route Frequency Ordered Stop   09/01/18 1000  fluconazole (DIFLUCAN) IVPB 400 mg     400 mg 100 mL/hr over 120 Minutes Intravenous Every 24 hours 09/01/18 0838     08/31/18 2200  vancomycin (VANCOCIN) 1,250 mg in sodium chloride 0.9 % 250 mL IVPB     1,250 mg 166.7 mL/hr over 90 Minutes Intravenous Every 12 hours 08/31/18 0852     08/31/18 1000  meropenem (MERREM) 1 g in sodium chloride 0.9 % 100  mL IVPB     1 g 200 mL/hr over 30 Minutes Intravenous Every 8 hours 08/31/18 0848     08/31/18 1000  vancomycin (VANCOCIN) 1,750 mg in sodium chloride 0.9 % 500 mL IVPB     1,750 mg 250 mL/hr over 120 Minutes Intravenous  Once 08/31/18 0852 08/31/18 1146   08/29/18 1400  levofloxacin (LEVAQUIN) IVPB 750 mg     750  mg 100 mL/hr over 90 Minutes Intravenous Every 24 hours 08/29/18 1353     08/28/18 2000  vancomycin (VANCOCIN) 1,250 mg in sodium chloride 0.9 % 250 mL IVPB  Status:  Discontinued     1,250 mg 166.7 mL/hr over 90 Minutes Intravenous Every 12 hours 08/28/18 1725 08/30/18 0853   08/28/18 1500  piperacillin-tazobactam (ZOSYN) IVPB 3.375 g  Status:  Discontinued     3.375 g 12.5 mL/hr over 240 Minutes Intravenous Every 8 hours 08/28/18 1245 08/31/18 0844   08/28/18 0900  vancomycin (VANCOCIN) 1,500 mg in sodium chloride 0.9 % 500 mL IVPB     1,500 mg 250 mL/hr over 120 Minutes Intravenous  Once 08/28/18 0854 08/28/18 1218   08/28/18 0900  piperacillin-tazobactam (ZOSYN) IVPB 3.375 g     3.375 g 100 mL/hr over 30 Minutes Intravenous  Once 08/28/18 0854 08/28/18 0935      Objective: Vitals:   09/01/18 0845 09/01/18 1000 09/01/18 1100 09/01/18 1112  BP:  138/80    Pulse:  (!) 125  (!) 119  Resp:  (!) 24  (!) 31  Temp: (!) 101.4 F (38.6 C)  99.5 F (37.5 C)   TempSrc: Axillary  Axillary   SpO2:  96%  100%  Weight:      Height:        Intake/Output Summary (Last 24 hours) at 09/01/2018 1132 Last data filed at 09/01/2018 0936 Gross per 24 hour  Intake 875.19 ml  Output 925 ml  Net -49.81 ml   Filed Weights   08/28/18 1400  Weight: 84.2 kg    Examination:  General: Mild respiratory distress  Cardiovascular: RRR, S1/S2 +, no rubs, no gallops Respiratory: tachypnea, Decrease breath sounds, mild rales at the bases, mild acc muscle use    Abdominal: Soft, NT, ND, bowel sounds + Extremities: no edema Neuro: AAOx3  Skin: Clean dry and intact   Data Reviewed: I have personally reviewed following labs and imaging studies  CBC: Recent Labs  Lab 08/28/18 0823 08/29/18 0444 08/30/18 0630 08/31/18 0530 09/01/18 0555  WBC 2.4* 2.0* 1.3* 1.7* 3.0*  NEUTROABS 2.2 1.9 1.2* 1.5* 2.6  HGB 9.5* 7.8* 8.6* 8.3* 8.7*  HCT 28.3* 23.7* 25.3* 25.0* 26.3*  MCV 92.2 93.7 91.7 91.6 91.6   PLT 67* 49* 58* 78* 93*   Basic Metabolic Panel: Recent Labs  Lab 08/28/18 0823 08/29/18 0444 08/30/18 0630 08/31/18 0530 09/01/18 0555  NA 131* 134* 131* 133* 136  K 4.6 4.0 3.6 4.0 3.9  CL 97* 102 96* 98 101  CO2 24 23 25 24 25   GLUCOSE 261* 205* 187* 131* 96  BUN 15 7* 10 16 17   CREATININE 0.45* 0.32* 0.41* 0.33* 0.37*  CALCIUM 7.6* 7.4* 7.7* 7.9* 7.9*  MG 1.8  --  1.9 2.0 1.9   GFR: Estimated Creatinine Clearance: 112.7 mL/min (A) (by C-G formula based on SCr of 0.37 mg/dL (L)). Liver Function Tests: Recent Labs  Lab 08/27/18 0931 08/28/18 0823 08/30/18 0630 08/31/18 0530  AST 24 29  --  29  ALT 46* 42  --  44  ALKPHOS 367* 315*  --  249*  BILITOT 0.5 0.5  --  0.4  PROT 5.1* 5.4*  --  5.3*  ALBUMIN 2.3* 2.3* 1.9* 1.9*   No results for input(s): LIPASE, AMYLASE in the last 168 hours. No results for input(s): AMMONIA in the last 168 hours. Coagulation Profile: No results for input(s): INR, PROTIME in the last 168 hours. Cardiac Enzymes: No results for input(s): CKTOTAL, CKMB, CKMBINDEX, TROPONINI in the last 168 hours. BNP (last 3 results) No results for input(s): PROBNP in the last 8760 hours. HbA1C: No results for input(s): HGBA1C in the last 72 hours. CBG: Recent Labs  Lab 08/31/18 0734 08/31/18 1129 08/31/18 1719 08/31/18 2217 09/01/18 0810  GLUCAP 104* 128* 314* 223* 61*   Lipid Profile: No results for input(s): CHOL, HDL, LDLCALC, TRIG, CHOLHDL, LDLDIRECT in the last 72 hours. Thyroid Function Tests: No results for input(s): TSH, T4TOTAL, FREET4, T3FREE, THYROIDAB in the last 72 hours. Anemia Panel: No results for input(s): VITAMINB12, FOLATE, FERRITIN, TIBC, IRON, RETICCTPCT in the last 72 hours. Sepsis Labs: Recent Labs  Lab 08/28/18 0842 08/28/18 1143 08/28/18 1909 08/29/18 1316 08/31/18 0530 09/01/18 0555  PROCALCITON  --   --   --  0.93 3.20 2.28  LATICACIDVEN 4.08* 2.02* 1.9  --   --   --     Recent Results (from the past 240  hour(s))  Culture, Blood     Status: None   Collection Time: 08/27/18  9:51 AM  Result Value Ref Range Status   Specimen Description BLOOD BLOOD RIGHT HAND PORTA CATH  Final   Special Requests   Final    BOTTLES DRAWN AEROBIC AND ANAEROBIC Blood Culture adequate volume   Culture   Final    NO GROWTH 5 DAYS Performed at [facility], [place] [address] [place], [place] [address]    Report Status 09/01/2018 FINAL  Final  Culture, Blood     Status: None   Collection Time: 08/27/18 10:10 AM  Result Value Ref Range Status   Specimen Description   Final    BLOOD RIGHT HAND Performed at [facility], [place] [address], [place], [place] [address]    Special Requests   Final    NONE Performed at [facility], [place] [address], [place], [place] [address]    Culture   Final    NO GROWTH 5 DAYS Performed at [facility], [place] [address] [place], [place] [address]    Report Status 09/01/2018 FINAL  Final  MRSA PCR Screening     Status: None   Collection Time: 08/28/18  1:45 PM  Result Value Ref Range Status   MRSA by PCR NEGATIVE NEGATIVE Final    Comment:        The GeneXpert MRSA Assay (FDA approved for NASAL specimens only), is one component of a comprehensive MRSA colonization surveillance program. It is not intended to diagnose MRSA infection nor to guide or monitor treatment for MRSA infections. Performed at [facility], [place] [address] [place], [place] [address]   Culture, blood (x 2)     Status: None (Preliminary result)   Collection Time: 08/28/18  7:09 PM  Result Value Ref Range Status   Specimen Description   Final    BLOOD LEFT HAND Performed at [place] [address] [place], [place] [address]    Special Requests   Final    BOTTLES DRAWN AEROBIC  AND ANAEROBIC Blood Culture adequate volume Performed at [facility] [address],  [place], [place] [address]    Culture   Final    NO GROWTH 4 DAYS Performed at [facility], [place] [address] [place], [place] [address]    Report Status PENDING  Incomplete  Culture, blood (x 2)     Status: None (Preliminary result)   Collection Time: 08/28/18  7:15 PM  Result Value Ref Range Status   Specimen Description   Final    BLOOD RIGHT HAND Performed at [place] [address] [place], [place] [address]    Special Requests   Final    BOTTLES DRAWN AEROBIC AND ANAEROBIC Blood Culture adequate volume Performed at [place] [address], [place], [place] [address]    Culture   Final    NO GROWTH 4 DAYS Performed at [facility], [place] [address] [place], [place] [address]    Report Status PENDING  Incomplete      Radiology Studies: Ct Chest Wo Contrast  Result Date: 08/30/2018 CLINICAL DATA:  Fever, fatigue and tachycardia. Undergoing chemotherapy for metastatic colon cancer. EXAM: CT CHEST WITHOUT CONTRAST TECHNIQUE: Multidetector CT imaging of the chest was performed following the standard protocol without IV contrast. COMPARISON:  Chest radiographs obtained yesterday. Chest CTA dated 08/27/2018. Abdomen and pelvis CT dated 02/25/2018. FINDINGS: Cardiovascular: Atheromatous calcifications, including the thoracic aorta. Coronary artery stent. Right jugular catheter tip in the right atrium. Mediastinum/Nodes: Mildly prominent AP window lymph node with a short axis diameter of 9 mm on image number 57 series 2, previously 8 mm. Normal appearing thyroid gland. Lungs/Pleura: Interval extensive patchy opacities throughout both lungs and increased right lower lobe atelectasis. Multiple small bilateral lung nodules are again demonstrated. Small bilateral pleural effusions, increased on the right and new on the left. Upper Abdomen: Faint, oval low density mass in the anterior aspect of the medial segment of the left lobe of the liver,  measuring 4.9 x 3.9 cm on image number 118 series 2. An enlarged lymph node or peritoneal mass anterior to that portion of the liver has not changed significantly, measuring 1.9 x 1.1 cm on image number 116 series 2. Musculoskeletal: No significant change in patchy sclerosis in the T1, T4, T5 and T6 vertebral bodies with paraspinal extension on the right at the T4-5 level. There is also patchy sclerosis with extension beyond the bone margins into the adjacent soft tissues involving the right 4th rib. IMPRESSION: 1. Interval extensive patchy opacities throughout both lungs, compatible with pneumonia. 2. Small bilateral pleural effusions, increased on the right and new on the left. 3. Stable small bilateral lung nodules compatible with metastatic disease. 4. Increased right lower lobe atelectasis. 5. Faint, oval low density mass in the anterior aspect of the medial segment of the left lobe of the liver, measuring 4.9 x 3.9 cm, compatible with a previously demonstrated metastasis. Other previously demonstrated liver metastases are not well visualized today without intravenous contrast. 6. Stable enlarged lymph node or peritoneal metastasis anterior to the liver on the right. 7. No significant change in metastatic disease involving the T1, T4, T5 and T6 vertebral bodies with paraspinal extension on the right at the T4-5 level. 8. Similar appearing right 4th rib metastasis with extension beyond the rib margins. 9. Minimal increase in size of a probable metastatic AP window lymph node. 10. Calcified aortic atherosclerosis. Aortic Atherosclerosis (ICD10-I70.0). Electronically Signed   By: Remo Lipps  Joneen Caraway M.D.   On: 08/30/2018 16:49      Scheduled Meds: . atorvastatin  10 mg Oral q1800  . Chlorhexidine Gluconate Cloth  6 each Topical Daily  . gabapentin  300 mg Oral TID  . insulin aspart  0-5 Units Subcutaneous QHS  . insulin aspart  0-9 Units Subcutaneous TID WC  . insulin aspart  5 Units Subcutaneous TID WC  .  insulin glargine  35 Units Subcutaneous QHS  . lisinopril  2.5 mg Oral Daily  . mouth rinse  15 mL Mouth Rinse BID  . morphine  15 mg Oral Daily  . multivitamin with minerals  1 tablet Oral Daily  . nystatin  5 mL Oral QID  . pantoprazole  40 mg Oral Daily  . predniSONE  40 mg Oral Q breakfast  . protein supplement shake  11 oz Oral TID BM  . sodium chloride flush  3 mL Intravenous Q12H  . tamsulosin  0.4 mg Oral QPC breakfast  . Tbo-filgastrim (GRANIX) SQ  480 mcg Subcutaneous Daily  . vitamin A & D       Continuous Infusions: . sodium chloride Stopped (08/30/18 0808)  . sodium chloride 500 mL (08/31/18 2242)  . fluconazole (DIFLUCAN) IV Stopped (09/01/18 1100)  . levofloxacin (LEVAQUIN) IV Stopped (08/31/18 1442)  . magnesium sulfate 1 - 4 g bolus IVPB 2 g (09/01/18 1103)  . meropenem (MERREM) IV Stopped (09/01/18 0936)  . vancomycin 1,250 mg (09/01/18 1042)     LOS: 4 days   Time spent: Total of 35 minutes spent with pt, greater than 50% of which was spent in discussion of  treatment, counseling and coordination of care   Chipper Oman, MD Pager: Text Page via [email or web address]   If 7PM-7AM, please contact night-coverage [email or web address] 09/01/2018, 11:32 AM   Note - This record has been created using [employer]. Chart creation errors have been sought, but may not always have been located. Such creation errors do not reflect on the standard of medical care.

## 2018-09-01 NOTE — Consult Note (Signed)
Allen Glenn  MRN: [id number]  DOB: 07/01/1956  DOA: 08/28/2018 Date of Consult: 9/.9/19  LOS 4 days  PCP: [name], MD   Reason for Consult / Chief Complaint:  Acute respiratory failure with infiltrates  Consulting MD:  Dr Quincy Simmonds and Dr Benay Spice oncology  HPI/Brief Narrative   Metastatic colon cancer with metastatic disease to the bone, liver, lungs and brain: The patient is followed by Dr. Dominica Severin B. Sherrill and is status post cycle 1, day 1 of FOLFIRI which was dosed on 08/19/2018. Also has CAD, lipid, BP. Prestned to ER 08/28/2018 with  Sepsis syndrom (103F), and SIRS tachycardia and tachypnea x 3 days and occult sepsi with lactate 4 with pancytopenia. . CTA the day prior was without PE of infiltrates but did have metastatic lesions. Rx with broad abx (zosyn, vanc and levoflox) and by 08/30/18 beginning to feel better and started on Granix. On 08/31/18 noted to be more tachypneic and Rx bipap though wbc/anc improving after granix. CT chest 08/30/18 showed new multi-focal infitlrates associatd with high procalcitionin levels. On 09/01/18 AM - CCM consulted for respiratory failure associated with hypoxemia and infiltrates in immune suppressed patient     has a past medical history of Acute MI ([place]) (2014), Anemia, Colon cancer ([place]) (2016), Coronary artery disease, Diabetes mellitus without complication ([place]), Enlarged prostate, Heart disease, History of blood transfusion, Hyperlipidemia, Hypertension, Lung nodule, Nocturia, Numbness, Pancreatitis, and Peripheral neuropathy.   reports that he has never smoked. He has never used smokeless tobacco.  Past Surgical History:  Procedure Laterality Date  . 1/8 of liver removed    . APPENDECTOMY    . APPLICATION OF CRANIAL NAVIGATION Left 06/04/2018   Procedure: APPLICATION OF CRANIAL NAVIGATION;  Surgeon: Erline Levine, MD;  Location: [place];  Service: Neurosurgery;  Laterality: Left;  . BIOPSY  05/22/2018   Procedure: BIOPSY;  Surgeon: Carol Ada, MD;  Location: [facility];  Service: Endoscopy;;  . BRAIN SURGERY  06/04/2018  . COLONOSCOPY    . CORONARY ANGIOPLASTY     1 stent  . CORONARY STENT PLACEMENT  06-27-2013  . CRANIOTOMY Left 06/04/2018   Procedure: CRANIOTOMY TUMOR EXCISION with Brainlab;  Surgeon: Erline Levine, MD;  Location: [place];  Service: Neurosurgery;  Laterality: Left;  CRANIOTOMY TUMOR EXCISION with Brainlab  . ESOPHAGOGASTRODUODENOSCOPY Left 05/22/2018   Procedure: ESOPHAGOGASTRODUODENOSCOPY (EGD);  Surgeon: Carol Ada, MD;  Location: [facility];  Service: Endoscopy;  Laterality: Left;  . HERNIA REPAIR Left 1991  . IR IMAGING GUIDED PORT INSERTION  07/18/2018  . LAPAROSCOPIC RIGHT COLECTOMY  2016   [place]  . LIVER LOBECTOMY Right 09/28/2015    Right partial hepate[facility] at [facility]  . LUNG BIOPSY Left 11/28/2016   Procedure: LUNG BIOPSY, left upper lobe;  Surgeon: [name], MD;  Location: [place];  Service: Thoracic;  Laterality: Left;  Marland Kitchen VIDEO BRONCHOSCOPY WITH ENDOBRONCHIAL NAVIGATION N/A 11/28/2016   Procedure: VIDEO BRONCHOSCOPY WITH ENDOBRONCHIAL NAVIGATION;  Surgeon: [name], MD;  Location: [place];  Service: Thoracic;  Laterality: N/A;  . VIDEO BRONCHOSCOPY WITH ENDOBRONCHIAL ULTRASOUND N/A 11/28/2016   Procedure: VIDEO BRONCHOSCOPY WITH ENDOBRONCHIAL ULTRASOUND;  Surgeon: [name], MD;  Location: [facility];  Service: Thoracic;  Laterality: N/A;    No Known Allergies   There is no immunization history on file for this patient.  Family History  Problem Relation Age of Onset  . Hypertension Mother   . Heart disease Mother   . Hypertension Father   .  Heart disease Father   . Neuropathy Neg Hx      Current Facility-Administered Medications:  .  0.9 %  sodium chloride infusion, 250 mL, Intravenous, PRN, Roxan Hockey, MD, Stopped at 08/30/18 7412 .  0.9 %  sodium chloride infusion, , Intravenous, PRN, Patrecia Pour, Christean Grief, MD, Last Rate: 10 mL/hr at 08/31/18 2242, 500  mL at 08/31/18 2242 .  acetaminophen (TYLENOL) tablet 650 mg, 650 mg, Oral, Q6H PRN, 650 mg at 08/31/18 1351 **OR** acetaminophen (TYLENOL) suppository 650 mg, 650 mg, Rectal, Q6H PRN, Denton Brick, Courage, MD, 650 mg at 09/01/18 0618 .  atorvastatin (LIPITOR) tablet 10 mg, 10 mg, Oral, q1800, Emokpae, Courage, MD, 10 mg at 08/29/18 1733 .  Chlorhexidine Gluconate Cloth 2 % PADS 6 each, 6 each, Topical, Daily, Emokpae, Courage, MD, 6 each at 08/31/18 1515 .  diphenoxylate-atropine (LOMOTIL) 2.5-0.025 MG per tablet 2 tablet, 2 tablet, Oral, QID PRN, Emokpae, Courage, MD .  fluconazole (DIFLUCAN) IVPB 400 mg, 400 mg, Intravenous, Q24H, Patrecia Pour, Christean Grief, MD, Last Rate: 100 mL/hr at 09/01/18 0911, 400 mg at 09/01/18 0911 .  gabapentin (NEURONTIN) capsule 300 mg, 300 mg, Oral, TID, Emokpae, Courage, MD, 300 mg at 09/01/18 0820 .  hydrALAZINE (APRESOLINE) injection 10 mg, 10 mg, Intravenous, Q6H PRN, Denton Brick, Courage, MD, 10 mg at 08/28/18 1607 .  insulin aspart (novoLOG) injection 0-5 Units, 0-5 Units, Subcutaneous, QHS, Emokpae, Courage, MD, 2 Units at 08/31/18 2237 .  insulin aspart (novoLOG) injection 0-9 Units, 0-9 Units, Subcutaneous, TID WC, Emokpae, Courage, MD, 7 Units at 08/31/18 1807 .  insulin aspart (novoLOG) injection 5 Units, 5 Units, Subcutaneous, TID WC, Patrecia Pour, Edwin, MD .  insulin glargine (LANTUS) injection 35 Units, 35 Units, Subcutaneous, QHS, Patrecia Pour, Christean Grief, MD, 35 Units at 08/31/18 2237 .  levalbuterol (XOPENEX) nebulizer solution 1.25 mg, 1.25 mg, Nebulization, Q4H PRN, Patrecia Pour, Christean Grief, MD, 1.25 mg at 08/30/18 8786 .  levofloxacin (LEVAQUIN) IVPB 750 mg, 750 mg, Intravenous, Q24H, Shade, Haze Justin, RPH, Stopped at 08/31/18 1442 .  lisinopril (PRINIVIL,ZESTRIL) tablet 2.5 mg, 2.5 mg, Oral, Daily, Emokpae, Courage, MD, 2.5 mg at 09/01/18 0820 .  LORazepam (ATIVAN) injection 0.5 mg, 0.5 mg, Intravenous, Q4H PRN, Patrecia Pour, Christean Grief, MD, 0.5 mg at 09/01/18 0659 .  magic  mouthwash, 5 mL, Oral, QID PRN, Denton Brick, Courage, MD, 5 mL at 08/31/18 0739 .  MEDLINE mouth rinse, 15 mL, Mouth Rinse, BID, Emokpae, Courage, MD, 15 mL at 09/01/18 0913 .  meropenem (MERREM) 1 g in sodium chloride 0.9 % 100 mL IVPB, 1 g, Intravenous, Q8H, Gadhia, Jigna M, RPH, Last Rate: 200 mL/hr at 09/01/18 0906, 1 g at 09/01/18 0906 .  metoCLOPramide (REGLAN) tablet 10 mg, 10 mg, Oral, BID PRN, Denton Brick, Courage, MD, 10 mg at 08/31/18 0739 .  metoprolol tartrate (LOPRESSOR) injection 2.5 mg, 2.5 mg, Intravenous, Q6H PRN, Patrecia Pour, Christean Grief, MD, 2.5 mg at 09/01/18 0224 .  morphine (MS CONTIN) 12 hr tablet 15 mg, 15 mg, Oral, Daily, Emokpae, Courage, MD, 15 mg at 09/01/18 0819 .  multivitamin with minerals tablet 1 tablet, 1 tablet, Oral, Daily, Patrecia Pour, Christean Grief, MD, 1 tablet at 09/01/18 [phone]  nitroGLYCERIN (NITROSTAT) SL tablet 0.4 mg, 0.4 mg, Sublingual, Q5 min PRN, Emokpae, Courage, MD .  nystatin (MYCOSTATIN) 100000 UNIT/ML suspension 500,000 Units, 5 mL, Oral, QID, Brunetta Genera, MD, 500,000 Units at 09/01/18 0820 .  ondansetron (ZOFRAN) tablet 4 mg, 4 mg, Oral, Q6H PRN **OR** ondansetron (ZOFRAN) injection 4 mg, 4 mg,  Intravenous, Q6H PRN, Emokpae, Courage, MD .  pantoprazole (PROTONIX) EC tablet 40 mg, 40 mg, Oral, Daily, Emokpae, Courage, MD, 40 mg at 09/01/18 0820 .  polyethylene glycol (MIRALAX / GLYCOLAX) packet 17 g, 17 g, Oral, Daily PRN, Emokpae, Courage, MD .  predniSONE (DELTASONE) tablet 40 mg, 40 mg, Oral, Q breakfast, Patrecia Pour, Christean Grief, MD, 40 mg at 09/01/18 0820 .  protein supplement (PREMIER PROTEIN) liquid - approved for s/p bariatric surgery, 11 oz, Oral, TID BM, Patrecia Pour, Christean Grief, MD, 11 oz at 09/01/18 0907 .  sodium chloride flush (NS) 0.9 % injection 10-40 mL, 10-40 mL, Intracatheter, PRN, Emokpae, Courage, MD .  sodium chloride flush (NS) 0.9 % injection 3 mL, 3 mL, Intravenous, Q12H, Emokpae, Courage, MD, 3 mL at 09/01/18 0907 .  sodium chloride flush  (NS) 0.9 % injection 3 mL, 3 mL, Intravenous, PRN, Emokpae, Courage, MD .  tamsulosin (FLOMAX) capsule 0.4 mg, 0.4 mg, Oral, QPC breakfast, Emokpae, Courage, MD, 0.4 mg at 09/01/18 0819 .  Tbo-Filgrastim (GRANIX) injection 480 mcg, 480 mcg, Subcutaneous, Daily, Brunetta Genera, MD, 480 mcg at 08/31/18 1728 .  traZODone (DESYREL) tablet 50 mg, 50 mg, Oral, QHS PRN, Denton Brick, Courage, MD, 50 mg at 08/31/18 2236 .  vancomycin (VANCOCIN) 1,250 mg in sodium chloride 0.9 % 250 mL IVPB, 1,250 mg, Intravenous, Q12H, Luiz Ochoa, RPH, Stopped at 09/01/18 0012    EVENTS   08/28/2018 -0 admit 09/01/18  - ccm consult  SUBJECTIVE/OVERNIGHT/INTERVAL HX   09/01/2018 = On 15L face mask NRB. RR low 30s. Not paradoxical. On biPAP all last night. Wants to eat badly.  Says feels some better   Objective    Vitals:   09/01/18 0800 09/01/18 0833 09/01/18 0845 09/01/18 1000  BP: 139/70   138/80  Pulse: (!) 126   (!) 125  Resp: (!) 37   (!) 24  Temp:   (!) 101.4 F (38.6 C)   TempSrc:   Axillary   SpO2: 100% 97%  96%  Weight:      Height:        Filed Weights   08/28/18 1400  Weight: 84.2 kg     FiO2 (%):  [50 %-100 %] 100 %   Physical Exam: General Appearance:   Sitting in bed Head:  Normocephalic, without obvious abnormality, atraumatic Eyes:  PERRL - yes, conjunctiva/corneas - clear     Ears:  Normal external ear canals, both ears Nose:  G tube - no Throat:  ETT TUBE - no bu thas FACE MAK O2 , OG tube - no Neck:  Supple,  No enlargement/tenderness/nodules Lungs: Clear to auscultation bilaterally, RR 30s, not paradoxical., not much acc muscule use Heart:  S1 and S2 normal, no murmur, CVP - no.  Pressors - no Abdomen:  Soft, no masses, no organomegaly Genitalia / Rectal:  Not done Extremities:  Extremities- intact Skin:  ntact in exposed areas .  Neurologic:  Sedation - none -> RASS - +1 . Moves all 4s - yes. CAM-ICU - neg . Orientation - x3+    Labs   PULMONARY Recent Labs    Lab 08/31/18 0925  PHART 7.497*  PCO2ART 29.2*  PO2ART 58.5*  HCO3 22.4  O2SAT 88.9    CBC Recent Labs  Lab 08/30/18 0630 08/31/18 0530 09/01/18 0555  HGB 8.6* 8.3* 8.7*  HCT 25.3* 25.0* 26.3*  WBC 1.3* 1.7* 3.0*  PLT 58* 78* 93*    COAGULATION No results for input(s): INR in the last 168  hours.  CARDIAC  No results for input(s): TROPONINI in the last 168 hours. No results for input(s): PROBNP in the last 168 hours.   CHEMISTRY Recent Labs  Lab 08/28/18 0823 08/29/18 0444 08/30/18 0630 08/31/18 0530 09/01/18 0555  NA 131* 134* 131* 133* 136  K 4.6 4.0 3.6 4.0 3.9  CL 97* 102 96* 98 101  CO2 24 23 25 24 25   GLUCOSE 261* 205* 187* 131* 96  BUN 15 7* 10 16 17   CREATININE 0.45* 0.32* 0.41* 0.33* 0.37*  CALCIUM 7.6* 7.4* 7.7* 7.9* 7.9*  MG 1.8  --  1.9 2.0 1.9   Estimated Creatinine Clearance: 112.7 mL/min (A) (by C-G formula based on SCr of 0.37 mg/dL (L)).   LIVER Recent Labs  Lab 08/27/18 0931 08/28/18 0823 08/30/18 0630 08/31/18 0530  AST 24 29  --  29  ALT 46* 42  --  44  ALKPHOS 367* 315*  --  249*  BILITOT 0.5 0.5  --  0.4  PROT 5.1* 5.4*  --  5.3*  ALBUMIN 2.3* 2.3* 1.9* 1.9*     INFECTIOUS Recent Labs  Lab 08/28/18 0842 08/28/18 1143 08/28/18 1909 08/29/18 1316 08/31/18 0530 09/01/18 0555  LATICACIDVEN 4.08* 2.02* 1.9  --   --   --   PROCALCITON  --   --   --  0.93 3.20 2.28     ENDOCRINE CBG (last 3)  Recent Labs    08/31/18 1719 08/31/18 2217 09/01/18 0810  GLUCAP 314* 223* 61*         IMAGING x48h  - image(s) personally visualized  -   highlighted in bold Ct Chest Wo Contrast  Result Date: 08/30/2018 CLINICAL DATA:  Fever, fatigue and tachycardia. Undergoing chemotherapy for metastatic colon cancer. EXAM: CT CHEST WITHOUT CONTRAST TECHNIQUE: Multidetector CT imaging of the chest was performed following the standard protocol without IV contrast. COMPARISON:  Chest radiographs obtained yesterday. Chest CTA dated  08/27/2018. Abdomen and pelvis CT dated 02/25/2018. FINDINGS: Cardiovascular: Atheromatous calcifications, including the thoracic aorta. Coronary artery stent. Right jugular catheter tip in the right atrium. Mediastinum/Nodes: Mildly prominent AP window lymph node with a short axis diameter of 9 mm on image number 57 series 2, previously 8 mm. Normal appearing thyroid gland. Lungs/Pleura: Interval extensive patchy opacities throughout both lungs and increased right lower lobe atelectasis. Multiple small bilateral lung nodules are again demonstrated. Small bilateral pleural effusions, increased on the right and new on the left. Upper Abdomen: Faint, oval low density mass in the anterior aspect of the medial segment of the left lobe of the liver, measuring 4.9 x 3.9 cm on image number 118 series 2. An enlarged lymph node or peritoneal mass anterior to that portion of the liver has not changed significantly, measuring 1.9 x 1.1 cm on image number 116 series 2. Musculoskeletal: No significant change in patchy sclerosis in the T1, T4, T5 and T6 vertebral bodies with paraspinal extension on the right at the T4-5 level. There is also patchy sclerosis with extension beyond the bone margins into the adjacent soft tissues involving the right 4th rib. IMPRESSION: 1. Interval extensive patchy opacities throughout both lungs, compatible with pneumonia. 2. Small bilateral pleural effusions, increased on the right and new on the left. 3. Stable small bilateral lung nodules compatible with metastatic disease. 4. Increased right lower lobe atelectasis. 5. Faint, oval low density mass in the anterior aspect of the medial segment of the left lobe of the liver, measuring 4.9 x 3.9 cm, compatible  with a previously demonstrated metastasis. Other previously demonstrated liver metastases are not well visualized today without intravenous contrast. 6. Stable enlarged lymph node or peritoneal metastasis anterior to the liver on the right. 7.  No significant change in metastatic disease involving the T1, T4, T5 and T6 vertebral bodies with paraspinal extension on the right at the T4-5 level. 8. Similar appearing right 4th rib metastasis with extension beyond the rib margins. 9. Minimal increase in size of a probable metastatic AP window lymph node. 10. Calcified aortic atherosclerosis. Aortic Atherosclerosis (ICD10-I70.0). Electronically Signed   By: Claudie Revering M.D.   On: 08/30/2018 16:49         Assessment & Plan:  Sepsis ([place]) wil lcal ID consult Check Resp Virus Panel Check urine strep Check urine legionella Bronch risky - will end up intubated - can always bronch if he develops resp muscle fatigue and needs intubation as primary event or empiric assumptions do not help situation  Acute respiratory failure with hypoxia ([place]) ? Onset of infiltrates with immune reconstitution with Granix Presenting Features all point to bacterial process  Plan Re-Start BiPAP 2h on /2h off day time+ QHS bipap (can reduce incidence of intubation) Intubate if worse Bronch BAL if he gets intubated (would avoid intubation for sake of bronch) Ok for light lunch (no hx of nausea, vomit) but after that clears    Electrolyte imbalance Low mag  Plan Replete mag     Best Practice / Goals of Care / Disposition.   DVT prophylaxis: per primary GI prophylaxis: per primary Diet: ok for light lunch but clears after that Mobility:bed rest Code Status: full Family Communication: wife and son and daughte rupdated  Disposition / Summary of Today's Plan 08/19/18   Continue bipap as above. ID Dr Megan Salon called    ATTESTATION & SIGNATURE    The patient is critically ill with multiple organ systems failure and requires high complexity decision making for assessment and support, frequent evaluation and titration of therapies, application of advanced monitoring technologies and extensive interpretation of multiple databases.   Critical Care  Time devoted to patient care services described in this note is  30  Minutes. This time reflects time of care of this signee Dr Brand Males. This critical care time does not reflect procedure time, or teaching time or supervisory time of PA/NP/Med student/Med Resident etc but could involve care discussion time      Dr. Brand Males, M.D., [place].C.P Pulmonary and Critical Care Medicine Staff Physician [facility] Pager: [phone], If no answer or between  15:00h - 7:00h: call [phone]  09/01/2018 10:25 AM  LOS 4 days

## 2018-09-02 ENCOUNTER — Inpatient Hospital Stay: Payer: 59 | Admitting: Oncology

## 2018-09-02 ENCOUNTER — Inpatient Hospital Stay: Payer: 59

## 2018-09-02 ENCOUNTER — Inpatient Hospital Stay (HOSPITAL_COMMUNITY): Payer: 59

## 2018-09-02 DIAGNOSIS — R0603 Acute respiratory distress: Secondary | ICD-10-CM

## 2018-09-02 DIAGNOSIS — Z7189 Other specified counseling: Secondary | ICD-10-CM

## 2018-09-02 DIAGNOSIS — R0902 Hypoxemia: Secondary | ICD-10-CM

## 2018-09-02 LAB — CULTURE, BLOOD (ROUTINE X 2)
CULTURE: NO GROWTH
Culture: NO GROWTH
Special Requests: ADEQUATE
Special Requests: ADEQUATE

## 2018-09-02 LAB — CBC WITH DIFFERENTIAL/PLATELET
Basophils Absolute: 0 10*3/uL (ref 0.0–0.1)
Basophils Relative: 0 %
EOS ABS: 0 10*3/uL (ref 0.0–0.7)
Eosinophils Relative: 0 %
HCT: 25.1 % — ABNORMAL LOW (ref 39.0–52.0)
Hemoglobin: 8.4 g/dL — ABNORMAL LOW (ref 13.0–17.0)
LYMPHS ABS: 0.2 10*3/uL (ref 0.7–4.0)
Lymphocytes Relative: 4 %
MCH: 30.5 pg (ref 26.0–34.0)
MCHC: 33.5 g/dL (ref 30.0–36.0)
MCV: 91.3 fL (ref 78.0–100.0)
MONOS PCT: 1 %
Monocytes Absolute: 0.1 10*3/uL (ref 0.1–1.0)
Neutro Abs: 5.4 10*3/uL (ref 1.7–7.7)
Neutrophils Relative %: 95 %
Platelets: 100 10*3/uL — ABNORMAL LOW (ref 150–400)
RBC: 2.75 MIL/uL — ABNORMAL LOW (ref 4.22–5.81)
RDW: 18.2 % — AB (ref 11.5–15.5)
WBC: 5.8 10*3/uL (ref 4.0–10.5)

## 2018-09-02 LAB — BLOOD GAS, ARTERIAL
ACID-BASE EXCESS: 0.8 mmol/L (ref 0.0–2.0)
Bicarbonate: 23.8 mmol/L (ref 20.0–28.0)
Delivery systems: POSITIVE
EXPIRATORY PAP: 6
FIO2: 80
INSPIRATORY PAP: 12
O2 Saturation: 87.6 %
PH ART: 7.463 — AB (ref 7.350–7.450)
Patient temperature: 98.6
RATE: 46 resp/min
pCO2 arterial: 33.8 mmHg (ref 32.0–48.0)
pO2, Arterial: 56.4 mmHg — ABNORMAL LOW (ref 83.0–108.0)

## 2018-09-02 LAB — BASIC METABOLIC PANEL
ANION GAP: 12 (ref 5–15)
BUN: 15 mg/dL (ref 8–23)
CALCIUM: 7.9 mg/dL — AB (ref 8.9–10.3)
CO2: 24 mmol/L (ref 22–32)
Chloride: 95 mmol/L — ABNORMAL LOW (ref 98–111)
Creatinine, Ser: 0.37 mg/dL — ABNORMAL LOW (ref 0.61–1.24)
GFR calc Af Amer: >60 mL/min (ref >60)
GFR calc non Af Amer: >60 mL/min (ref >60)
Glucose, Bld: 146 mg/dL — ABNORMAL HIGH (ref 70–99)
Potassium: 4.2 mmol/L (ref 3.5–5.1)
Sodium: 131 mmol/L — ABNORMAL LOW (ref 135–145)

## 2018-09-02 LAB — GLUCOSE, CAPILLARY
GLUCOSE-CAPILLARY: 115 mg/dL — AB (ref 70–99)
GLUCOSE-CAPILLARY: 292 mg/dL — AB (ref 70–99)
Glucose-Capillary: 241 mg/dL — ABNORMAL HIGH (ref 70–99)
Glucose-Capillary: 216 mg/dL — ABNORMAL HIGH (ref 70–99)
Glucose-Capillary: 253 mg/dL — ABNORMAL HIGH (ref 70–99)

## 2018-09-02 LAB — MAGNESIUM: Magnesium: 2.4 mg/dL (ref 1.7–2.4)

## 2018-09-02 MED ORDER — MORPHINE SULFATE 2 MG/ML IJ SOLN
2.0000 mg | INTRAMUSCULAR | Status: DC | PRN
  Administered 2018-09-02: 2 mg via INTRAVENOUS
  Filled 2018-09-02: qty 1
  Filled 2018-09-02: qty 2

## 2018-09-02 MED ORDER — VITAMINS A & D EX OINT
TOPICAL_OINTMENT | CUTANEOUS | Status: AC
  Administered 2018-09-02: 11:00:00
  Filled 2018-09-02: qty 5

## 2018-09-02 MED ORDER — MORPHINE SULFATE (PF) 2 MG/ML IV SOLN
2.0000 mg | INTRAVENOUS | Status: DC | PRN
  Administered 2018-09-03 (×2): 2 mg via INTRAVENOUS
  Administered 2018-09-03 (×2): 4 mg via INTRAVENOUS
  Administered 2018-09-04 – 2018-09-05 (×6): 2 mg via INTRAVENOUS
  Administered 2018-09-05: 1 mg via INTRAVENOUS
  Administered 2018-09-06 – 2018-09-09 (×9): 2 mg via INTRAVENOUS
  Administered 2018-09-13: 4 mg via INTRAVENOUS
  Filled 2018-09-02: qty 1
  Filled 2018-09-02 (×2): qty 2
  Filled 2018-09-02 (×2): qty 1
  Filled 2018-09-02: qty 2
  Filled 2018-09-02 (×10): qty 1
  Filled 2018-09-02: qty 2
  Filled 2018-09-02: qty 1
  Filled 2018-09-02: qty 2
  Filled 2018-09-02 (×2): qty 1
  Filled 2018-09-02: qty 2
  Filled 2018-09-02: qty 1

## 2018-09-02 MED ORDER — MORPHINE SULFATE (PF) 2 MG/ML IV SOLN
1.0000 mg | INTRAVENOUS | Status: DC | PRN
  Administered 2018-09-02 (×2): 1 mg via INTRAVENOUS
  Filled 2018-09-02 (×2): qty 1

## 2018-09-02 MED ORDER — INSULIN GLARGINE 100 UNIT/ML ~~LOC~~ SOLN
30.0000 [IU] | Freq: Every day | SUBCUTANEOUS | Status: DC
  Administered 2018-09-02 – 2018-09-04 (×3): 30 [IU] via SUBCUTANEOUS
  Filled 2018-09-02 (×4): qty 0.3

## 2018-09-02 MED ORDER — LORAZEPAM 2 MG/ML IJ SOLN
0.5000 mg | Freq: Three times a day (TID) | INTRAMUSCULAR | Status: DC
  Administered 2018-09-02 – 2018-09-04 (×5): 0.5 mg via INTRAVENOUS
  Filled 2018-09-02 (×5): qty 1

## 2018-09-02 MED ORDER — MORPHINE SULFATE (PF) 2 MG/ML IV SOLN
1.0000 mg | INTRAVENOUS | Status: DC | PRN
  Administered 2018-09-02: 1 mg via INTRAVENOUS
  Filled 2018-09-02: qty 1

## 2018-09-02 MED ORDER — MORPHINE SULFATE (PF) 2 MG/ML IV SOLN: 1.0000 mg | INTRAVENOUS | Status: DC | PRN

## 2018-09-02 MED ORDER — LORAZEPAM 2 MG/ML IJ SOLN: 1.0000 mg | INTRAMUSCULAR | Status: DC | PRN

## 2018-09-02 MED ORDER — FUROSEMIDE 10 MG/ML IJ SOLN
40.0000 mg | Freq: Once | INTRAMUSCULAR | Status: AC
  Administered 2018-09-02: 40 mg via INTRAVENOUS
  Filled 2018-09-02: qty 4

## 2018-09-02 MED ORDER — METHYLPREDNISOLONE SODIUM SUCC 125 MG IJ SOLR
60.0000 mg | Freq: Four times a day (QID) | INTRAMUSCULAR | Status: DC
  Administered 2018-09-02 – 2018-09-07 (×21): 60 mg via INTRAVENOUS
  Filled 2018-09-02 (×21): qty 2

## 2018-09-02 NOTE — Progress Notes (Signed)
IP PROGRESS NOTE  Subjective:   Allen Glenn mains on BiPAP.  He is febrile.  Multiple family members are at the bedside.  Objective: Vital signs in last 24 hours: Blood pressure 128/65, pulse (!) 127, temperature 99.9 F (37.7 C), temperature source Axillary, resp. rate (!) 50, height 6' 2"  (1.88 m), weight 185 lb 10 oz (84.2 kg), SpO2 90 %.  Intake/Output from previous day: 09/09 0701 - 09/10 0700 In: 2166.4 [P.O.:120; I.V.:107.3; IV Piggyback:1939.2] Out: 2126 [Urine:2125; Stool:1]  Physical Exam:  HEENT: BIPAP mask in place Lungs: Increased respiratory rate, clear anteriorly Cardiac: Regular rate and rhythm Abdomen: Nontender, no hepatosplenomegaly Extremities: No leg edema Neurologic: Alert, follows commands  Portacath/PICC-without erythema  Lab Results: Recent Labs    09/01/18 0555 09/02/18 0615  WBC 3.0* 5.8  HGB 8.7* 8.4*  HCT 26.3* 25.1*  PLT 93* 100*   ANC 5.4 BMET Recent Labs    09/01/18 0555 09/02/18 0615  NA 136 131*  K 3.9 4.2  CL 101 95*  CO2 25 24  GLUCOSE 96 146*  BUN 17 15  CREATININE 0.37* 0.37*  CALCIUM 7.9* 7.9*    Lab Results  Component Value Date   CEA1 71.41 (H) 08/19/2018    Studies/Results: Dg Chest Port 1 View  Result Date: 09/02/2018 CLINICAL DATA:  Hypoxia. EXAM: PORTABLE CHEST 1 VIEW COMPARISON:  CT 3 days ago 08/30/2018, radiograph 4 days ago 08/29/2018 FINDINGS: Right chest port with tip in the distal SVC. Unchanged heart size and mediastinal contours. Multifocal irregular airspace opacities with slight improvement on the right and no significant change on the left. Pleural effusions on prior CT are not well visualized on portable AP view. No pneumothorax. IMPRESSION: Multifocal bilateral pulmonary opacities with some improvement on the right and no significant change on the left. Findings may represent multifocal pneumonia or pulmonary edema. Pleural effusions on prior CT are not well visualized radiographically on AP view.  Electronically Signed   By: Keith Rake M.D.   On: 09/02/2018 01:34    Medications: I have reviewed the patient's current medications.  Assessment/Plan:  1. 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 No BRAFmutation  Liver biopsy 05/04/2015-metastatic adenocarcinoma consistent with a colon primary ? Staging PET scan 06/08/2015-isolated segment 4A liver lesion ? Initiation of adjuvant FOLFOX 06/13/2015 ? Restaging CT 08/09/2015 revealed a slight decrease in a borderline ileocolic node, decrease in the hepatic dome metastasis, no new lesions ? Liver resection 09/28/2015-pathology consistent with metastatic colon cancer, negative margins ? Adjuvant FOLFOX resumed 11/08/2015, oxaliplatin eliminated beginning 11/22/2015 secondary to neuropathy. He completed adjuvant chemotherapy 02/16/2016 ? Restaging chest CT 11/08/2016, compared to 05/07/2016 revealed a new 9 mm left upper lobe nodule, stable 2.2 cm right hepatic lesion ? PET scan 11/19/2016 confirmed a hypermetabolic left upper lobe nodule, hypermetabolic left paratracheal and pericardiac lymph nodes, and hypermetabolism associated with the hypoattenuating lesion in the dome of the liver ? Status post EBUSbiopsies of the left lingula nodule and a level 4Lnode on 11/28/2016-no evidence of malignancy ? CT chest 02/11/2017-increase in size of the left pulmonary nodule and epicardial lymph node ? Status post SBRT 2 the left lung nodule and mediastinum completed 03/14/2017 ? CTs 07/01/2017-new 9 mm focus along the right liver capsule, stable left subcapsular liver lesion, radiation change at site of left upper lobe nodule, stable pericardiallymph node ? CTs 11/25/2017-new 5 mm lingular nodule, enlargement of capsular-based right liver lesion,  new capsular based right liver lesion capsular lesion at the  hepatic dome ? CTs 02/25/2018-enlargement of small lung nodules, liver lesions and small mesenteric lymph node ? CT abdomen/pelvis 05/15/2018-new lower lobe pulmonary nodules, enlargement of liver lesions and right lower quadrant soft tissue nodules ? 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 Brain MRI 05/26/2018-3.5 similar left posterior fossa mass, 4 additional subcentimeter enhancing brain lesions-3 cerebellar, 1 left thalamic ? SRS to all 5 brain lesions 06/02/2018 ? Surgical excision of left cerebellar lesion 06/04/2018, adenocarcinoma consistent with colorectal primary ? MRI lumbar spine 06/18/2018-extensive metastases to the lumbar spine, sacrum, T12, and pelvis, pathologic fracture of L3 ? Initiation of radiation from T12-S2 06/30/2018, completed 07/11/2018 ? Cycle 1 FOLFIRI 08/19/2018 2. Coronary artery disease status post a myocardial infarction in 2014  3. Hypertension  4. Hyperlipidemia  5. Diabetes  6. Admission 05/20/2018 with nausea/vomitingsecondary to a cerebellar metastasis  7.Oral candidiasis 06/11/2018-started on Mycelex troches  8. 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  Admission 08/28/2018 with a febrile illness, likely pneumonia  10.  Anemia/thrombocytopenia secondary to chemotherapy and infection-the white count and platelets have recovered  11.  Respiratory failure secondary to pneumonia   Mr. Calabretta has declined from a respiratory standpoint.  He remains febrile.  He has decided against intubation.  His family understands he will likely not survive this hospital admission.  We added morphine for comfort.  I discussed his status with Ms. Rushlow early this morning and again this afternoon.  Recommendations: 1.  Continue antibiotics 2.  BiPAP/oxygen support 3.  Morphine and Ativan as needed for comfort   LOS: 5 days   Betsy Coder, MD   09/02/2018, 1:46 PM

## 2018-09-02 NOTE — Progress Notes (Signed)
Patient ID: Allen Glenn, male   DOB: 04/30/1956, 62 y.o.   MRN: [id number]         [facility] for Infectious Disease  Date of Admission:  08/28/2018    Day 6 vancomycin        Day 5 levofloxacin        Day 2 fluconazole        Day 1 trimethoprim sulfamethoxazole ASSESSMENT: He is becoming progressively more hypoxic and is now dependent on BiPAP.  He does not want to be intubated.  Blood cultures and respiratory virus panel are negative.  PLAN: 1. 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 atorvastatin  10 mg Oral q1800  . Chlorhexidine Gluconate Cloth  6 each Topical Daily  . gabapentin  300 mg Oral TID  . insulin aspart  0-5 Units Subcutaneous QHS  . insulin aspart  0-9 Units Subcutaneous TID WC  . insulin aspart  5 Units Subcutaneous TID WC  . insulin glargine  30 Units Subcutaneous QHS  . lisinopril  2.5 mg Oral Daily  . mouth rinse  15 mL Mouth Rinse BID  . methylPREDNISolone (SOLU-MEDROL) injection  60 mg Intravenous Q6H  . morphine  15 mg Oral Daily  . multivitamin with minerals  1 tablet Oral Daily  . nystatin  5 mL Oral QID  . pantoprazole  40 mg Oral Daily  . protein supplement shake  11 oz Oral TID BM  . sodium chloride flush  3 mL Intravenous Q12H  . tamsulosin  0.4 mg Oral QPC breakfast    Continuous Infusions: . sodium chloride Stopped (08/30/18 0808)  . sodium chloride 500 mL (08/31/18 2242)  . fluconazole (DIFLUCAN) IV 400 mg (09/02/18 0942)  . levofloxacin (LEVAQUIN) IV Stopped (09/01/18 1500)  . sulfamethoxazole-trimethoprim 420 mg (09/02/18 0940)  . vancomycin Stopped (09/01/18 2328)   PRN Meds:.sodium chloride, sodium chloride, acetaminophen **OR** acetaminophen, diphenoxylate-atropine, hydrALAZINE, levalbuterol, LORazepam, magic mouthwash, metoCLOPramide, metoprolol tartrate, morphine injection, nitroGLYCERIN, ondansetron **OR** ondansetron (ZOFRAN) IV, polyethylene glycol, sodium chloride flush, sodium chloride flush, traZODone   Review of Systems: Review of Systems  Unable to perform ROS: Severe respiratory distress    No Known Allergies  OBJECTIVE: Vitals:   09/02/18 0740 09/02/18 0800 09/02/18 0906 09/02/18 1003  BP:  127/70    Pulse:  (!) 126  (!) 135  Resp:  (!) 47  (!) 30  Temp: (!) 100.9 F (38.3 C)   99.2 F (37.3 C)  TempSrc: Axillary     SpO2:  92% 93% 94%  Weight:      Height:       Body mass index is 23.83 kg/m.  Physical Exam  Constitutional:  He is more lethargic.  Family is at the bedside.  Cardiovascular: Regular rhythm and normal heart sounds.  No murmur heard.  He is tachycardic.  Pulmonary/Chest:  He is tachypneic.  He has become more hypoxic overnight.    Lab Results Lab Results  Component Value Date   WBC 5.8 09/02/2018   HGB 8.4 (L) 09/02/2018   HCT 25.1 (L) 09/02/2018   MCV 91.3 09/02/2018   PLT 100 (L) 09/02/2018    Lab Results  Component Value Date   CREATININE 0.37 (L) 09/02/2018   BUN 15 09/02/2018   NA 131 (L) 09/02/2018   K 4.2 09/02/2018   CL 95 (L) 09/02/2018   CO2 24 09/02/2018    Lab Results  Component Value Date   ALT 44 08/31/2018   AST 29 08/31/2018   ALKPHOS 249 (H) 08/31/2018   BILITOT 0.4 08/31/2018     Microbiology: Recent Results (from the past 240 hour(s))  Culture, Blood      Status: None   Collection Time: 08/27/18  9:51 AM  Result Value Ref Range Status   Specimen Description BLOOD BLOOD RIGHT HAND PORTA CATH  Final   Special Requests   Final    BOTTLES DRAWN AEROBIC AND ANAEROBIC Blood Culture adequate volume   Culture   Final    NO GROWTH 5 DAYS Performed at [facility], [place] [address], [place], [place] [address]    Report Status 09/01/2018 FINAL  Final  Culture, Blood     Status: None   Collection Time: 08/27/18 10:10 AM  Result Value Ref Range Status   Specimen Description   Final    BLOOD RIGHT HAND Performed at [facility], [place] [address] [place], [place] [address]    Special Requests   Final    NONE Performed at [facility], [place] [address] [place], [place] [address]    Culture   Final    NO GROWTH 5 DAYS Performed at [facility], [place] [address], [place], [place] [address]    Report Status 09/01/2018 FINAL  Final  MRSA PCR Screening     Status: None   Collection Time: 08/28/18  1:45 PM  Result Value Ref Range Status   MRSA by PCR NEGATIVE NEGATIVE Final    Comment:        The GeneXpert MRSA Assay (FDA approved for NASAL specimens only), is one component of a comprehensive MRSA colonization surveillance program. It is not intended to diagnose MRSA infection nor to guide or monitor treatment for MRSA infections. Performed at [facility], [place] [address], [place], [place] [address]   Culture, blood (x 2)     Status: None   Collection Time: 08/28/18  7:09 PM  Result Value Ref Range Status   Specimen Description   Final    BLOOD LEFT HAND Performed at [place] [address] [place], [place] [address]    Special Requests   Final    BOTTLES DRAWN AEROBIC AND ANAEROBIC Blood Culture adequate volume Performed at [place] [address], [place], [place] [address]    Culture   Final    NO GROWTH 5  DAYS Performed at [facility], [place] [address], [place],  [address]    Report Status 09/02/2018 FINAL  Final  Culture, blood (x 2)     Status: None   Collection Time: 08/28/18  7:15 PM  Result Value Ref Range Status   Specimen Description   Final    BLOOD RIGHT HAND Performed at [facility], [address] [place], [place] [address]    Special Requests   Final    BOTTLES DRAWN AEROBIC AND ANAEROBIC Blood Culture adequate volume Performed at [place] [address] [place], [place] [address]    Culture   Final    NO GROWTH 5 DAYS Performed at [facility], [place] [address] [place], [place] [address]    Report Status 09/02/2018 FINAL  Final  Respiratory Panel by PCR     Status: None   Collection Time: 09/01/18 11:46 AM  Result Value Ref Range Status   Adenovirus NOT DETECTED NOT DETECTED Final   Coronavirus 229E NOT DETECTED NOT DETECTED Final   Coronavirus HKU1 NOT DETECTED NOT DETECTED Final   Coronavirus NL63 NOT DETECTED NOT DETECTED Final   Coronavirus OC43 NOT DETECTED NOT DETECTED Final   Metapneumovirus NOT DETECTED NOT DETECTED Final   Rhinovirus / Enterovirus NOT DETECTED NOT DETECTED Final   Influenza A NOT DETECTED NOT DETECTED Final   Influenza B NOT DETECTED NOT DETECTED Final   Parainfluenza Virus 1 NOT DETECTED NOT DETECTED Final   Parainfluenza Virus 2 NOT DETECTED NOT DETECTED Final   Parainfluenza Virus 3 NOT DETECTED NOT DETECTED Final   Parainfluenza Virus 4 NOT DETECTED NOT DETECTED Final   Respiratory Syncytial Virus NOT DETECTED NOT DETECTED Final   Bordetella pertussis NOT DETECTED NOT DETECTED Final   Chlamydophila pneumoniae NOT DETECTED NOT DETECTED Final   Mycoplasma pneumoniae NOT DETECTED NOT DETECTED Final    Comment: Performed at [facility], [place] [address] [place], [place] [address]    Michel Bickers, [place] for Infectious [place]  [facility] [phone] pager   [phone] cell 09/02/2018, 10:44 AM

## 2018-09-02 NOTE — Progress Notes (Signed)
Nutrition  Nutrition follow-up was planned for today during infusion.  Chart reviewed and noted hospital admission currently.  Nutrition follow-up now planned for 9/24 during infusion.  Kathy Wahid B. Zenia Resides, [place], [place] Registered Dietitian [phone] (pager)

## 2018-09-02 NOTE — Progress Notes (Signed)
eLink Physician-Brief Progress Note Patient Name: Allen Glenn DOB: 05/27/1956 MRN: [id number]   Date of Service  09/02/2018  HPI/Events of Note  Patient continues to decompensate from a respiratory point of view. Patient now DNR/DNI, however, otherwise wants aggressive care. Nursing request for more pain medication.   eICU Interventions  Will increase Morphine to 1 mg IV Q 1 hour PRN pain/anxiety.      Intervention Category Intermediate Interventions: Pain - evaluation and management Minor Interventions: Agitation / anxiety - evaluation and management  Ayanni Tun [place] 09/02/2018, 4:26 AM

## 2018-09-02 NOTE — Progress Notes (Signed)
Patient ID: Allen Glenn, male   DOB: 1956/08/24, 62 y.o.   MRN: [id number]  This NP visited patient at the bedside as a follow up to  yesterday's [place].  Wife and two children at bedside.   Patient appears comfortable on BiPap.  Patient continues to be BiPap dependant. He is weak but able to communicate with family. They are keeping vigil, wife remains at bedside day and night.  He is BiPap dependant; quickly desaturation of oxygen if only for mouth care and sips if fluids.  Morphine in place for symptoms.  Created space and opportunity for family to express thoughts and feelings.  [name]/wife speak to a wonder man; husband, father and friend. Wife struggles with her husband's moratlity  She is beginning to speak to the reality that patient will likely not survive this hospitalization.  She questions "he cannot stay on that Bipap for ever ?"  Long discussion regarding natural trajectory at EOL, utilization of medications to enhance comfort.  Wife voices "I do not want him to suffer"  Discussed de-escalation of medical interventions and shifting to a full comfort approach to care.  Mrs Bryngelson is unable to shift fully at this time.  Emotional support offered.  Both children at bedside do not add to this conversation, they sit quietly.  No questions or input.  Discussed with wife  the importance of continued conversation with her husband and family regarding overall plan of care and treatment options,  ensuring decisions are within the patient's best interest and GOCs.  Questions and concerns addressed     Informed family that Dr Morrison Old will f/u with them tomorrow for palliative medicine needs and emotional support.  Total time spent on the unit was 60  minutes  Greater than 50% of the time was spent in counseling and coordination of care  [name] NP  Palliative Medicine Team Team Phone # [phone] Pager [phone]

## 2018-09-02 NOTE — Progress Notes (Signed)
PROGRESS NOTE [facility]   Allen Glenn   ZOX:[id number] DOB: 08/26/1956  DOA: 08/28/2018 PCP: [name], MD   Brief Narrative:  Allen Glenn is a 62 year old male with past medical history of diabetes, CAD, hypertension and metastatic colon cancer on chemotherapy 8/27 who presented to the emergency department complaining of fever, fatigue and tachycardia.  Upon ED evaluation patient was found to be hypoxic, elevated lactic acid and febrile.  Patient was admitted with working diagnosis of sepsis of unknown origin and started on empiric antibiotics. Found to have severe bilateral pneumonia. Decompensated started on BiPAP. ID and PCCM consulted    Subjective: Patient seen and examined, he has more tachypnea, still febrile, overnight had increase wob, PCCM evaluated and patient decided to be DNR/DNI.   Assessment & Plan: Sepsis due to pneumonia Bilateral PNA, continues to decline on current antibiotic therapy. CT chest performed 9/7 shows extensive opacities throughout both lungs. Small bilateral pleural effusion and bilateral lung nodule compatible with metastatic disease.  Chest x-ray repeated overnight with some signs of fluid overload and ?  ARDS.  Will add Lasix today, continue on BiPAP.  ID recommending to treat empirically for PCP pneumonia as LDH was elevated.  Patient currently on Bactrim, vancomycin, Levaquin and Diflucan.  Legionella and strep antigen are negative.  Merrem has been discontinued. PCR viral panel negative.  Neutropenia continues to improve after receiving Granix. ?  Pneumonitis will increase prednisone to solumedrol 60 mg q6 hrs. Continue supportive treatment. He continues with tachypnea will add morphine for palliative purposes.    Acute respiratory failure with hypoxia Multifactorial due to multifocal pneumonia, fluid overload sent now possible ARDS.  Patient and family decided on DNR/DNI.  Will continue BiPAP for now and try to control rate with morphine.   Palliative care has been consulted, if no turnaround in the next 24 hours with treatment patient might benefit from full comfort care.  Thrombocytopenia - improving In setting of chemotherapy and sepsis.   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 Management per Oncology   HTN BP remains stable, continue lisinopril.  Sinus tachycardia  Remains tachycardic suspect from sepsis and resp failure, echo with no abnormalities.  Continue  IV metoprolol 2.5 mg as needed for heart rate above 130 bpm  Type 2 DM   A1C 7.9. CBG's in the low side, patient no eating much will d/c lantus continue sliding scale and meal coverage for now. Monitor CBG's   Goals of cares DNR/DNI, if care consulted, patient has significantly deteriorated, x-ray now showing possible signs of ARDS.  If no improvement in the next 24-36 hr, need to start thinking about comfort measures only.  DVT prophylaxis: SCD's Code Status: DNR/DNI Family Communication: Wife at bedside  Disposition Plan: Continue to monitor in SDU   Consultants:   Oncology   Procedures:   None   Antimicrobials: Anti-infectives (From admission, onward)   Start     Dose/Rate Route Frequency Ordered Stop   09/01/18 1800  sulfamethoxazole-trimethoprim (BACTRIM) 420 mg in dextrose 5 % 500 mL IVPB     420 mg 350.8 mL/hr over 90 Minutes Intravenous Every 8 hours 09/01/18 1529     09/01/18 1000  fluconazole (DIFLUCAN) IVPB 400 mg     400 mg 100 mL/hr over 120 Minutes Intravenous Every 24 hours 09/01/18 0838     08/31/18 2200  vancomycin (VANCOCIN) 1,250 mg in sodium chloride 0.9 % 250 mL IVPB  1,250 mg 166.7 mL/hr over 90 Minutes Intravenous Every 12 hours 08/31/18 0852     08/31/18 1000  meropenem (MERREM) 1 g in sodium chloride 0.9 % 100 mL IVPB  Status:  Discontinued     1 g 200 mL/hr over 30 Minutes Intravenous Every 8 hours 08/31/18 0848 09/01/18 1546   08/31/18 1000  vancomycin (VANCOCIN)  1,750 mg in sodium chloride 0.9 % 500 mL IVPB     1,750 mg 250 mL/hr over 120 Minutes Intravenous  Once 08/31/18 0852 08/31/18 1146   08/29/18 1400  levofloxacin (LEVAQUIN) IVPB 750 mg     750 mg 100 mL/hr over 90 Minutes Intravenous Every 24 hours 08/29/18 1353     08/28/18 2000  vancomycin (VANCOCIN) 1,250 mg in sodium chloride 0.9 % 250 mL IVPB  Status:  Discontinued     1,250 mg 166.7 mL/hr over 90 Minutes Intravenous Every 12 hours 08/28/18 1725 08/30/18 0853   08/28/18 1500  piperacillin-tazobactam (ZOSYN) IVPB 3.375 g  Status:  Discontinued     3.375 g 12.5 mL/hr over 240 Minutes Intravenous Every 8 hours 08/28/18 1245 08/31/18 0844   08/28/18 0900  vancomycin (VANCOCIN) 1,500 mg in sodium chloride 0.9 % 500 mL IVPB     1,500 mg 250 mL/hr over 120 Minutes Intravenous  Once 08/28/18 0854 08/28/18 1218   08/28/18 0900  piperacillin-tazobactam (ZOSYN) IVPB 3.375 g     3.375 g 100 mL/hr over 30 Minutes Intravenous  Once 08/28/18 0854 08/28/18 0935      Objective: Vitals:   09/02/18 0415 09/02/18 0600 09/02/18 0740 09/02/18 0906  BP:      Pulse:      Resp:      Temp: (!) 103 F (39.4 C) 100.1 F (37.8 C) (!) 100.9 F (38.3 C)   TempSrc: Axillary  Axillary   SpO2:    93%  Weight:      Height:        Intake/Output Summary (Last 24 hours) at 09/02/2018 0926 Last data filed at 09/02/2018 0600 Gross per 24 hour  Intake 2146.48 ml  Output 2001 ml  Net 145.48 ml   Filed Weights   08/28/18 1400  Weight: 84.2 kg    Examination:  General: Respiratory distress Cardiovascular: tachy, S1/S2 +, no rubs, no gallops Respiratory: Decreased breath sounds bilaterally, mild bibasilar crackles, abdominal breathing Abdominal: Soft, NT,  Extremities: no edema Neuro: AAOx3 Skin: No rashes   Data Reviewed: I have personally reviewed following labs and imaging studies  CBC: Recent Labs  Lab 08/29/18 0444 08/30/18 0630 08/31/18 0530 09/01/18 0555 09/02/18 0615  WBC 2.0* 1.3*  1.7* 3.0* 5.8  NEUTROABS 1.9 1.2* 1.5* 2.6 5.4  HGB 7.8* 8.6* 8.3* 8.7* 8.4*  HCT 23.7* 25.3* 25.0* 26.3* 25.1*  MCV 93.7 91.7 91.6 91.6 91.3  PLT 49* 58* 78* 93* 462*   Basic Metabolic Panel: Recent Labs  Lab 08/28/18 0823 08/29/18 0444 08/30/18 0630 08/31/18 0530 09/01/18 0555 09/02/18 0615  NA 131* 134* 131* 133* 136 131*  K 4.6 4.0 3.6 4.0 3.9 4.2  CL 97* 102 96* 98 101 95*  CO2 24 23 25 24 25 24   GLUCOSE 261* 205* 187* 131* 96 146*  BUN 15 7* 10 16 17 15   CREATININE 0.45* 0.32* 0.41* 0.33* 0.37* 0.37*  CALCIUM 7.6* 7.4* 7.7* 7.9* 7.9* 7.9*  MG 1.8  --  1.9 2.0 1.9 2.4   GFR: Estimated Creatinine Clearance: 112.7 mL/min (A) (by C-G formula based on SCr of 0.37 mg/dL (  L)). Liver Function Tests: Recent Labs  Lab 08/27/18 0931 08/28/18 0823 08/30/18 0630 08/31/18 0530  AST 24 29  --  29  ALT 46* 42  --  44  ALKPHOS 367* 315*  --  249*  BILITOT 0.5 0.5  --  0.4  PROT 5.1* 5.4*  --  5.3*  ALBUMIN 2.3* 2.3* 1.9* 1.9*   No results for input(s): LIPASE, AMYLASE in the last 168 hours. No results for input(s): AMMONIA in the last 168 hours. Coagulation Profile: No results for input(s): INR, PROTIME in the last 168 hours. Cardiac Enzymes: No results for input(s): CKTOTAL, CKMB, CKMBINDEX, TROPONINI in the last 168 hours. BNP (last 3 results) No results for input(s): PROBNP in the last 8760 hours. HbA1C: No results for input(s): HGBA1C in the last 72 hours. CBG: Recent Labs  Lab 09/01/18 0844 09/01/18 1304 09/01/18 1646 09/01/18 2152 09/02/18 0838  GLUCAP 108* 122* 235* 225* 115*   Lipid Profile: No results for input(s): CHOL, HDL, LDLCALC, TRIG, CHOLHDL, LDLDIRECT in the last 72 hours. Thyroid Function Tests: No results for input(s): TSH, T4TOTAL, FREET4, T3FREE, THYROIDAB in the last 72 hours. Anemia Panel: No results for input(s): VITAMINB12, FOLATE, FERRITIN, TIBC, IRON, RETICCTPCT in the last 72 hours. Sepsis Labs: Recent Labs  Lab 08/28/18 0842  08/28/18 1143 08/28/18 1909 08/29/18 1316 08/31/18 0530 09/01/18 0555  PROCALCITON  --   --   --  0.93 3.20 2.28  LATICACIDVEN 4.08* 2.02* 1.9  --   --   --     Recent Results (from the past 240 hour(s))  Culture, Blood     Status: None   Collection Time: 08/27/18  9:51 AM  Result Value Ref Range Status   Specimen Description BLOOD BLOOD RIGHT HAND PORTA CATH  Final   Special Requests   Final    BOTTLES DRAWN AEROBIC AND ANAEROBIC Blood Culture adequate volume   Culture   Final    NO GROWTH 5 DAYS Performed at [facility], [place] [address], [place], [place] [address]    Report Status 09/01/2018 FINAL  Final  Culture, Blood     Status: None   Collection Time: 08/27/18 10:10 AM  Result Value Ref Range Status   Specimen Description   Final    BLOOD RIGHT HAND Performed at [facility], [place] [address] [place], [place] [address]    Special Requests   Final    NONE Performed at [facility], [place] [address], [place], [place] [address]    Culture   Final    NO GROWTH 5 DAYS Performed at [facility], [place] [address] [place], [place] [address]    Report Status 09/01/2018 FINAL  Final  MRSA PCR Screening     Status: None   Collection Time: 08/28/18  1:45 PM  Result Value Ref Range Status   MRSA by PCR NEGATIVE NEGATIVE Final    Comment:        The GeneXpert MRSA Assay (FDA approved for NASAL specimens only), is one component of a comprehensive MRSA colonization surveillance program. It is not intended to diagnose MRSA infection nor to guide or monitor treatment for MRSA infections. Performed at [facility], [place] [address], [place],  [address]   Culture, blood (x 2)     Status: None   Collection Time: 08/28/18  7:09 PM  Result Value Ref Range Status   Specimen Description   Final    BLOOD  LEFT HAND Performed at [facility], [place] [address] [place], [place]  [address]    Special Requests   Final    BOTTLES DRAWN AEROBIC AND ANAEROBIC Blood Culture adequate volume Performed at [place] [address] [place], [place] [address]    Culture   Final    NO GROWTH 5 DAYS Performed at [facility], [place] [address] [place], [place] [address]    Report Status 09/02/2018 FINAL  Final  Culture, blood (x 2)     Status: None   Collection Time: 08/28/18  7:15 PM  Result Value Ref Range Status   Specimen Description   Final    BLOOD RIGHT HAND Performed at [place] [address], [place], [place] [address]    Special Requests   Final    BOTTLES DRAWN AEROBIC AND ANAEROBIC Blood Culture adequate volume Performed at [place] [address], [place], [place] [address]    Culture   Final    NO GROWTH 5 DAYS Performed at [facility], [place] [address] [place], [place] [address]    Report Status 09/02/2018 FINAL  Final  Respiratory Panel by PCR     Status: None   Collection Time: 09/01/18 11:46 AM  Result Value Ref Range Status   Adenovirus NOT DETECTED NOT DETECTED Final   Coronavirus 229E NOT DETECTED NOT DETECTED Final   Coronavirus HKU1 NOT DETECTED NOT DETECTED Final   Coronavirus NL63 NOT DETECTED NOT DETECTED Final   Coronavirus OC43 NOT DETECTED NOT DETECTED Final   Metapneumovirus NOT DETECTED NOT DETECTED Final   Rhinovirus / Enterovirus NOT DETECTED NOT DETECTED Final   Influenza A NOT DETECTED NOT DETECTED Final   Influenza B NOT DETECTED NOT DETECTED Final   Parainfluenza Virus 1 NOT DETECTED NOT DETECTED Final   Parainfluenza Virus 2 NOT DETECTED NOT DETECTED Final   Parainfluenza Virus 3 NOT DETECTED NOT DETECTED Final   Parainfluenza Virus 4 NOT DETECTED NOT DETECTED Final   Respiratory Syncytial Virus NOT DETECTED NOT DETECTED Final   Bordetella pertussis NOT DETECTED NOT DETECTED Final   Chlamydophila pneumoniae NOT DETECTED NOT DETECTED Final    Mycoplasma pneumoniae NOT DETECTED NOT DETECTED Final    Comment: Performed at [facility], [place] [address], [place], [place] [address]      Radiology Studies: Dg Chest Port 1 View  Result Date: 09/02/2018 CLINICAL DATA:  Hypoxia. EXAM: PORTABLE CHEST 1 VIEW COMPARISON:  CT 3 days ago 08/30/2018, radiograph 4 days ago 08/29/2018 FINDINGS: Right chest port with tip in the distal SVC. Unchanged heart size and mediastinal contours. Multifocal irregular airspace opacities with slight improvement on the right and no significant change on the left. Pleural effusions on prior CT are not well visualized on portable AP view. No pneumothorax. IMPRESSION: Multifocal bilateral pulmonary opacities with some improvement on the right and no significant change on the left. Findings may represent multifocal pneumonia or pulmonary edema. Pleural effusions on prior CT are not well visualized radiographically on AP view. Electronically Signed   By: Keith Rake M.D.   On: 09/02/2018 01:34      Scheduled Meds: . atorvastatin  10 mg Oral q1800  . Chlorhexidine Gluconate Cloth  6 each Topical Daily  . furosemide  40 mg Intravenous Once  . gabapentin  300 mg Oral TID  . insulin aspart  0-5 Units Subcutaneous QHS  . insulin aspart  0-9 Units Subcutaneous TID  WC  . insulin aspart  5 Units Subcutaneous TID WC  . insulin glargine  35 Units Subcutaneous QHS  . lisinopril  2.5 mg Oral Daily  . mouth rinse  15 mL Mouth Rinse BID  . morphine  15 mg Oral Daily  . multivitamin with minerals  1 tablet Oral Daily  . nystatin  5 mL Oral QID  . pantoprazole  40 mg Oral Daily  . predniSONE  40 mg Oral Q breakfast  . protein supplement shake  11 oz Oral TID BM  . sodium chloride flush  3 mL Intravenous Q12H  . tamsulosin  0.4 mg Oral QPC breakfast   Continuous Infusions: . sodium chloride Stopped (08/30/18 0808)  . sodium chloride 500 mL (08/31/18 2242)  . fluconazole (DIFLUCAN) IV Stopped (09/01/18 1100)  .  levofloxacin (LEVAQUIN) IV Stopped (09/01/18 1500)  . sulfamethoxazole-trimethoprim Stopped (09/02/18 0315)  . vancomycin Stopped (09/01/18 2328)     LOS: 5 days   Time spent: Total of 35 minutes spent with pt, greater than 50% of which was spent in discussion of  treatment, counseling and coordination of care   Chipper Oman, MD Pager: Text Page via [email or web address]   If 7PM-7AM, please contact night-coverage [email or web address] 09/02/2018, 9:26 AM   Note - This record has been created using [employer]. Chart creation errors have been sought, but may not always have been located. Such creation errors do not reflect on the standard of medical care.

## 2018-09-02 NOTE — Significant Event (Signed)
.. ..    Name: Allen Glenn MRN: [id number] DOB: Jun 28, 1956    ADMISSION DATE:  08/28/2018   BRIEF PATIENT DESCRIPTION:  62 yr old male with PMHx significant for DM, CAD, HTN and metastatic colon CA on chemotherapy presented with Hypoxic respiratory failure and being managed for Multifocal Pneumonia. During the course of hospital admission pt's breathing has not improved despite aggressive diuresis noted to have Small bilateral pleural effusions and bilateral lung nodule compatible with metastatic disease.  Was started on NIPPV. Asked by E link to see pt this evening.  I discussed in detail with patient and wife possible outcomes given his continued respiratory decline. He had incr4eased work og breathing HR in 140s and visibly uncomfortable on my exam.  Coarse b/l breath sounds. NIPPV in place with good seal and receiving adequate VT but RR increasing.  We discussed intubation and the patient was very clear that he did not want to be intubated. We had a discussion about what his ultimate goal was, he and his wife stated that they ultimately wanted to be able to go home so he could continue chemotherapy after explaining the gravity of his current clinical illness and the possible outcomes the patient expressed that he wanted to be comfortable. He wanted to continue current medications/drips but does not want CPR, ACLS or intubation. His wife was present for the conversation and so was his nurse.      I, Dr Seward Carol have personally reviewed patient's available data, including medical history, events of note, physical examination and test results as part of my evaluation. I have discussed with other care providers such as RN and RT. The patient is critically ill with multiple organ systems failure and requires high complexity decision making for assessment and support, frequent evaluation and titration of therapies, application of advanced monitoring technologies and extensive interpretation of  multiple databases. Critical Care Time devoted to patient care services described in this note is 50 Minutes. This time reflects time of care of this signee Dr Seward Carol. This critical care time does not reflect procedure time, or teaching time or supervisory timebut could involve care discussion time    CC TIME:50 mins PROGNOSIS:Poor FAMILY:Wife at bedside CODE STATUS: DNR/DNI Palliative consult was placed prior to my evaluation   Signed Dr Seward Carol Pulmonary Critical Care Locums  09/02/2018, 1:18 AM

## 2018-09-02 NOTE — Progress Notes (Signed)
Called to place pt. back on BiPAP after being removed by staff for drink, found on NRB mask-replaced BiPAP V-60 at previous settings, >'d BiPAP settings from 10/5 up to 12/6 with Fio2 >'d from .70 to .80 to help achieve sats > than 88% and closer to >/= to 93% per current order after pt. being rolled onto L side for comfort, RT to monitor.

## 2018-09-02 NOTE — Progress Notes (Signed)
D/w  Dr Quincy Simmonds of [facility] - ccm can sign off    SIGNATURE    Dr. Brand Males, M.D., F.C.C.P,  Pulmonary and Critical Care Medicine Staff Physician, [place] Director - Interstitial Lung Disease  Program  Pulmonary [place] [facility] [place], [place], [address]  Pager: [phone], If no answer or between  15:00h - 7:00h: call [phone] Telephone: [phone]  1:27 PM 09/02/2018

## 2018-09-02 NOTE — Progress Notes (Signed)
Physical Therapy Discharge Patient Details Name: Allen Glenn MRN: [id number] DOB: May 05, 1956 Today's Date: 09/02/2018 Time:  -     Patient discharged from PT services secondary to medical decline - will need to re-order PT to resume therapy services. Respiratory distress worsened, on BiPAP,  Please see latest therapy progress note for current level of functioning and progress toward goals.   [place] Pager [phone] Office [phone]  GP     [name] 09/02/2018, 2:59 PM

## 2018-09-02 NOTE — Progress Notes (Signed)
eLink Physician-Brief Progress Note Patient Name: Allen Glenn DOB: 22-Apr-1956 MRN: [id number]   Date of Service  09/02/2018  HPI/Events of Note  Hypoxia - Now on 70% BiPAP with Sat = 92% and RR 30-40. Can off BiPAP for Nutritional Supplement and promptly desaturated. History of Colon CA with mets to lungs.   eICU Interventions  Will order: 1. Portable CXR STAT. 2. ABG STAT. 3. Ground team notified to evaluate the patient at bedside. He is full code. My feeling is that he needs GOC and failing that intubated and ventilated.      Intervention Category Major Interventions: Hypoxemia - evaluation and management  Abriella Filkins [place] 09/02/2018, 12:57 AM

## 2018-09-02 NOTE — Care Management (Signed)
  Discharge Readiness Milestones  MCG Used: pneumonia  Optimal GLOS: extended stay Hospital Day: 5 Discharge Readiness Milestones Extended Stay Return to top of Pneumonia Due to Aspiration RRG - ISC Minimal (a few hours to 1 day), Brief (1 to 3 days), Moderate (4 to 7 days), and Prolonged (more than 7 days). [Expand All / Collapse All]  Extended stay beyond goal length of stay may be needed for: ? Severe or nosocomial infection  Gram-negative or anaerobic infections may produce severe injury.  If respiratory status deteriorates, anticipate broadened antibiotics, possible bronchoscopy, and need for mechanical ventilation.  Transition patient to recovery facility when stable.  Expect brief to moderate stay extension. ? Empyema, atelectasis, large pleural effusion, or lung abscess  Empyema may require repetitive thoracentesis, chest tube drainage, intrapleural fibrinolysis, or thoracoscopic adhesiolysis.  Atelectasis or pleural effusion may require thoracentesis or chest tube drainage.  Lung abscess may require prolonged antibiotic therapy or (rarely) percutaneous, bronchoscopic, or surgical drainage.  Expect brief to moderate stay extension. ? Severe hypoxemia or respiratory failure  Acute respiratory distress syndrome may occur following large-volume aspiration.  Anticipate respiratory monitoring and treatment.  Expect brief to moderate stay extension. ? Comorbid clinically significant electrolyte disorder (eg, hypernatremia, hyponatremia)  Expect brief stay extension. ? Need for parenteral or tube (enteral) feedings (eg, severe malnutrition)  Expect brief to moderate stay extension. ? Active comorbidities (eg, heart failure, COPD, renal failure)  Anticipate treatment directed at comorbidity.  Expect brief stay extension. ? Comorbid severe neurologic problems  Severe neurologic disease may delay mobilization and recovery.  Patient with severe neuromuscular disease  and recurrent aspiration may require feeding tube placement, tracheostomy, or laryngotracheal separation.  Expect brief stay extension. ?  Other complication or condition  Identified Barriers: Date/Name case discussed with attending MD (if applicable):  Date/name case discussed with supervisor (if applicable):  Date/name case referred to physician advisor (if applicable)   Concurrent Review - Discharge Readiness Milestones, Not Met

## 2018-09-03 ENCOUNTER — Other Ambulatory Visit: Payer: Self-pay | Admitting: Radiation Therapy

## 2018-09-03 ENCOUNTER — Inpatient Hospital Stay (HOSPITAL_COMMUNITY): Payer: 59

## 2018-09-03 DIAGNOSIS — Z515 Encounter for palliative care: Secondary | ICD-10-CM

## 2018-09-03 DIAGNOSIS — R0902 Hypoxemia: Secondary | ICD-10-CM

## 2018-09-03 DIAGNOSIS — Z9989 Dependence on other enabling machines and devices: Secondary | ICD-10-CM

## 2018-09-03 LAB — CBC WITH DIFFERENTIAL/PLATELET
BASOS ABS: 0 10*3/uL (ref 0.0–0.1)
Basophils Relative: 0 %
EOS ABS: 0 10*3/uL (ref 0.0–0.7)
Eosinophils Relative: 0 %
HCT: 23.5 % — ABNORMAL LOW (ref 39.0–52.0)
Hemoglobin: 7.7 g/dL — ABNORMAL LOW (ref 13.0–17.0)
LYMPHS PCT: 3 %
Lymphs Abs: 0.2 10*3/uL — ABNORMAL LOW (ref 0.7–4.0)
MCH: 30.1 pg (ref 26.0–34.0)
MCHC: 32.8 g/dL (ref 30.0–36.0)
MCV: 91.8 fL (ref 78.0–100.0)
Monocytes Absolute: 0.2 10*3/uL (ref 0.1–1.0)
Monocytes Relative: 4 %
NEUTROS PCT: 93 %
Neutro Abs: 5.6 10*3/uL (ref 1.7–7.7)
PLATELETS: 93 10*3/uL — AB (ref 150–400)
RBC: 2.56 MIL/uL — ABNORMAL LOW (ref 4.22–5.81)
RDW: 18.3 % — ABNORMAL HIGH (ref 11.5–15.5)
WBC: 6 10*3/uL (ref 4.0–10.5)
nRBC: 1 /100 WBC — ABNORMAL HIGH

## 2018-09-03 LAB — GLUCOSE, CAPILLARY
GLUCOSE-CAPILLARY: 237 mg/dL — AB (ref 70–99)
GLUCOSE-CAPILLARY: 242 mg/dL — AB (ref 70–99)
GLUCOSE-CAPILLARY: 315 mg/dL — AB (ref 70–99)
Glucose-Capillary: 234 mg/dL — ABNORMAL HIGH (ref 70–99)
Glucose-Capillary: 255 mg/dL — ABNORMAL HIGH (ref 70–99)

## 2018-09-03 LAB — MAGNESIUM: MAGNESIUM: 2.6 mg/dL — AB (ref 1.7–2.4)

## 2018-09-03 LAB — BASIC METABOLIC PANEL
Anion gap: 12 (ref 5–15)
BUN: 14 mg/dL (ref 8–23)
CO2: 24 mmol/L (ref 22–32)
CREATININE: 0.4 mg/dL — AB (ref 0.61–1.24)
Calcium: 7.9 mg/dL — ABNORMAL LOW (ref 8.9–10.3)
Chloride: 94 mmol/L — ABNORMAL LOW (ref 98–111)
Glucose, Bld: 294 mg/dL — ABNORMAL HIGH (ref 70–99)
Potassium: 4.6 mmol/L (ref 3.5–5.1)
SODIUM: 130 mmol/L — AB (ref 135–145)

## 2018-09-03 LAB — LEGIONELLA PNEUMOPHILA SEROGP 1 UR AG: L. PNEUMOPHILA SEROGP 1 UR AG: NEGATIVE

## 2018-09-03 MED ORDER — FUROSEMIDE 10 MG/ML IJ SOLN: 40.0000 mg | Freq: Once | INTRAMUSCULAR | Status: DC

## 2018-09-03 MED ORDER — FUROSEMIDE 10 MG/ML IJ SOLN
40.0000 mg | Freq: Every day | INTRAMUSCULAR | Status: DC
  Administered 2018-09-03 – 2018-09-10 (×8): 40 mg via INTRAVENOUS
  Filled 2018-09-03 (×8): qty 4

## 2018-09-03 NOTE — Progress Notes (Signed)
Palliative care progress note  Patient ID: Allen Glenn, male   DOB: March 22, 1956, 62 y.o.   MRN: [id number]  Chart reviewed, including pertinent labs and imaging.  I saw and examined Allen Glenn.  Patient appears comfortable on BiPap and denies any needs or complaints.  Patient continues to be BiPap dependant. He is weak but able to communicate with family.   He is BiPap dependant; quickly desaturation of oxygen if only for mouth care and sips if fluids.  Morphine in place for symptoms.  Met with his wife and children outside of the room.  Emotional support offered.  Discussed with his wife regarding his continued decline.  She reports understanding that time is short.  Reports had a "glimmer of hope" that he would improve, but reports that she understands this is not going to be the case.  Discussed with family about the importance of continued conversation with her husband and family regarding overall plan of care and treatment options, ensuring decisions continue to focus on things most important to her husband.  Family agrees that goals are to be able to spend time with family and avoid suffering.  We discussed plan to continue Bipap, but if it appears that either of these is being compromised, will need further discussion if it is continuing to benefit him.   Plan to continue current interventions for another 24 hours and reassess his situation.  Family understands will likely need to consider shift to full comfort soon.  Questions and concerns addressed     Total time: 60  minutes  Greater than 50% of the time was spent in counseling and coordination of care  Micheline Rough, MD [place] Team [phone]

## 2018-09-03 NOTE — Progress Notes (Signed)
Pt. remained on BiPAP t/o evening with being taken off intermitantly for mouth care/medication admin. with moderate desaturations, slowly recovering once BiPAP replaced, family remaining at bedside.

## 2018-09-03 NOTE — Progress Notes (Signed)
PROGRESS NOTE [facility]   TRACIE LINDBLOOM   EHU:[id number] DOB: 1956/07/04  DOA: 08/28/2018 PCP: [name], MD   Brief Narrative:  ARSHDEEP BOLGER is a 63 year old male with past medical history of diabetes, CAD, hypertension and metastatic colon cancer on chemotherapy 8/27 who presented to the emergency department complaining of fever, fatigue and tachycardia.  Upon ED evaluation patient was found to be hypoxic, elevated lactic acid and febrile.  Patient was admitted with working diagnosis of sepsis of unknown origin and started on empiric antibiotics. Found to have severe bilateral pneumonia. Decompensated started on BiPAP. ID and PCCM consulted    Subjective: Patient seen and examined with family at bedside.  Remains on BiPAP.  He is slightly less tachypneic today.  No fever overnight.  He reports feeling okay this morning.  Chest x-ray showed worsening infection and/or edema.  Assessment & Plan: Sepsis due to pneumonia Bilateral PNA, no changes in status today. CT chest performed 9/7 shows extensive opacities throughout both lungs. Small bilateral pleural effusion and bilateral lung nodule compatible with metastatic disease.  Chest x-ray 9/11 with worsening signs of infection/edema and possible ARDS.  Will continue Lasix daily.  Continue BiPAP.  Will continue current antibiotic therapy with Bactrim, vancomycin, Levaquin and Diflucan.  Neutropenia continues to improve.  Patient been treated with Solu-Medrol 60 mg every 6 for possible pneumonitis.  Continue supportive treatment with Ativan and morphine for tachypnea.  ID following.  Acute respiratory failure with hypoxia Multifactorial due to multifocal pneumonia, fluid overload and now with poss ARDS.  Patient and family decided on DNR/DNI.  Will continue BiPAP for now and try to control resp rate with morphine.  Palliative care has been consulted, if no turnaround in the next 24-36 hours with current treatment patient might benefit  from full comfort care measures.    Thrombocytopenia - stable  In setting of chemotherapy and sepsis.  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 Management per Oncology   HTN BP remains stable, continue lisinopril.  Sinus tachycardia  Remains tachycardic suspect from sepsis and resp failure, echo with no abnormalities.  Continue  IV metoprolol 2.5 mg as needed for heart rate above 130 bpm  Type 2 DM   Last A1c 7.9.  CBGs now elevated in setting of high-dose steroids.  Patient n.p.o. we will continue sliding scale for now.  Continue to monitor CBGs  Goals of cares DNR/DNI, continue to monitor over the next 24 hours if no improvement will might benefit of for comfort care.  DVT prophylaxis: SCD's Code Status: DNR/DNI Family Communication: Family at bedside Disposition Plan: Continue to monitor in SDU   Consultants:   Oncology   Procedures:   None   Antimicrobials: Anti-infectives (From admission, onward)   Start     Dose/Rate Route Frequency Ordered Stop   09/01/18 1800  sulfamethoxazole-trimethoprim (BACTRIM) 420 mg in dextrose 5 % 500 mL IVPB     420 mg 350.8 mL/hr over 90 Minutes Intravenous Every 8 hours 09/01/18 1529     09/01/18 1000  fluconazole (DIFLUCAN) IVPB 400 mg     400 mg 100 mL/hr over 120 Minutes Intravenous Every 24 hours 09/01/18 0838     08/31/18 2200  vancomycin (VANCOCIN) 1,250 mg in sodium chloride 0.9 % 250 mL IVPB     1,250 mg 166.7 mL/hr over 90 Minutes Intravenous Every 12 hours 08/31/18 0852     08/31/18  1000  meropenem (MERREM) 1 g in sodium chloride 0.9 % 100 mL IVPB  Status:  Discontinued     1 g 200 mL/hr over 30 Minutes Intravenous Every 8 hours 08/31/18 0848 09/01/18 1546   08/31/18 1000  vancomycin (VANCOCIN) 1,750 mg in sodium chloride 0.9 % 500 mL IVPB     1,750 mg 250 mL/hr over 120 Minutes Intravenous  Once 08/31/18 0852 08/31/18 1146   08/29/18 1400   levofloxacin (LEVAQUIN) IVPB 750 mg     750 mg 100 mL/hr over 90 Minutes Intravenous Every 24 hours 08/29/18 1353     08/28/18 2000  vancomycin (VANCOCIN) 1,250 mg in sodium chloride 0.9 % 250 mL IVPB  Status:  Discontinued     1,250 mg 166.7 mL/hr over 90 Minutes Intravenous Every 12 hours 08/28/18 1725 08/30/18 0853   08/28/18 1500  piperacillin-tazobactam (ZOSYN) IVPB 3.375 g  Status:  Discontinued     3.375 g 12.5 mL/hr over 240 Minutes Intravenous Every 8 hours 08/28/18 1245 08/31/18 0844   08/28/18 0900  vancomycin (VANCOCIN) 1,500 mg in sodium chloride 0.9 % 500 mL IVPB     1,500 mg 250 mL/hr over 120 Minutes Intravenous  Once 08/28/18 0854 08/28/18 1218   08/28/18 0900  piperacillin-tazobactam (ZOSYN) IVPB 3.375 g     3.375 g 100 mL/hr over 30 Minutes Intravenous  Once 08/28/18 0854 08/28/18 0935      Objective: Vitals:   09/03/18 0000 09/03/18 0400 09/03/18 0700 09/03/18 0800  BP: (!) 110/59   (!) 148/70  Pulse: (!) 102  94 96  Resp: (!) 32  (!) 41 (!) 34  Temp: 98.8 F (37.1 C) 98 F (36.7 C)  97.6 F (36.4 C)  TempSrc: Axillary Axillary  Axillary  SpO2: 94%  91% 92%  Weight:      Height:        Intake/Output Summary (Last 24 hours) at 09/03/2018 0908 Last data filed at 09/03/2018 0800 Gross per 24 hour  Intake 2650.94 ml  Output 3300 ml  Net -649.06 ml   Filed Weights   08/28/18 1400  Weight: 84.2 kg    Examination:  General: Pt is alert, awake, not in acute distress Cardiovascular: RRR, S1/S2 +, no rubs, no gallops Respiratory: Decreased breath sounds bilaterally, however mild improving air entry.  Mild bibasilar crackles, abdominal breathing.  No associated muscle use.  No wheezing Abdominal: Soft, NT, ND, bowel sounds + Extremities: no edema Neuro: AAOx3 Skin: No rashes   Data Reviewed: I have personally reviewed following labs and imaging studies  CBC: Recent Labs  Lab 08/30/18 0630 08/31/18 0530 09/01/18 0555 09/02/18 0615 09/03/18 0336    WBC 1.3* 1.7* 3.0* 5.8 6.0  NEUTROABS 1.2* 1.5* 2.6 5.4 5.6  HGB 8.6* 8.3* 8.7* 8.4* 7.7*  HCT 25.3* 25.0* 26.3* 25.1* 23.5*  MCV 91.7 91.6 91.6 91.3 91.8  PLT 58* 78* 93* 100* 93*   Basic Metabolic Panel: Recent Labs  Lab 08/30/18 0630 08/31/18 0530 09/01/18 0555 09/02/18 0615 09/03/18 0336  NA 131* 133* 136 131* 130*  K 3.6 4.0 3.9 4.2 4.6  CL 96* 98 101 95* 94*  CO2 25 24 25 24 24   GLUCOSE 187* 131* 96 146* 294*  BUN 10 16 17 15 14   CREATININE 0.41* 0.33* 0.37* 0.37* 0.40*  CALCIUM 7.7* 7.9* 7.9* 7.9* 7.9*  MG 1.9 2.0 1.9 2.4 2.6*   GFR: Estimated Creatinine Clearance: 112.7 mL/min (A) (by C-G formula based on SCr of 0.4 mg/dL (L)). Liver Function  Tests: Recent Labs  Lab 08/27/18 0931 08/28/18 0823 08/30/18 0630 08/31/18 0530  AST 24 29  --  29  ALT 46* 42  --  44  ALKPHOS 367* 315*  --  249*  BILITOT 0.5 0.5  --  0.4  PROT 5.1* 5.4*  --  5.3*  ALBUMIN 2.3* 2.3* 1.9* 1.9*   No results for input(s): LIPASE, AMYLASE in the last 168 hours. No results for input(s): AMMONIA in the last 168 hours. Coagulation Profile: No results for input(s): INR, PROTIME in the last 168 hours. Cardiac Enzymes: No results for input(s): CKTOTAL, CKMB, CKMBINDEX, TROPONINI in the last 168 hours. BNP (last 3 results) No results for input(s): PROBNP in the last 8760 hours. HbA1C: No results for input(s): HGBA1C in the last 72 hours. CBG: Recent Labs  Lab 09/02/18 1155 09/02/18 1533 09/02/18 1832 09/02/18 2108 09/03/18 0813  GLUCAP 241* 216* 292* 253* 237*   Lipid Profile: No results for input(s): CHOL, HDL, LDLCALC, TRIG, CHOLHDL, LDLDIRECT in the last 72 hours. Thyroid Function Tests: No results for input(s): TSH, T4TOTAL, FREET4, T3FREE, THYROIDAB in the last 72 hours. Anemia Panel: No results for input(s): VITAMINB12, FOLATE, FERRITIN, TIBC, IRON, RETICCTPCT in the last 72 hours. Sepsis Labs: Recent Labs  Lab 08/28/18 0842 08/28/18 1143 08/28/18 1909  08/29/18 1316 08/31/18 0530 09/01/18 0555  PROCALCITON  --   --   --  0.93 3.20 2.28  LATICACIDVEN 4.08* 2.02* 1.9  --   --   --     Recent Results (from the past 240 hour(s))  Culture, Blood     Status: None   Collection Time: 08/27/18  9:51 AM  Result Value Ref Range Status   Specimen Description BLOOD BLOOD RIGHT HAND PORTA CATH  Final   Special Requests   Final    BOTTLES DRAWN AEROBIC AND ANAEROBIC Blood Culture adequate volume   Culture   Final    NO GROWTH 5 DAYS Performed at [facility], [place] [address], [place], [place] [address]    Report Status 09/01/2018 FINAL  Final  Culture, Blood     Status: None   Collection Time: 08/27/18 10:10 AM  Result Value Ref Range Status   Specimen Description   Final    BLOOD RIGHT HAND Performed at [facility], [place] [address] [place], [place] [address]    Special Requests   Final    NONE Performed at [facility], [place] [address], [place], [place] [address]    Culture   Final    NO GROWTH 5 DAYS Performed at [facility], [place] [address] [place], [place] [address]    Report Status 09/01/2018 FINAL  Final  MRSA PCR Screening     Status: None   Collection Time: 08/28/18  1:45 PM  Result Value Ref Range Status   MRSA by PCR NEGATIVE NEGATIVE Final    Comment:        The GeneXpert MRSA Assay (FDA approved for NASAL specimens only), is one component of a comprehensive MRSA colonization surveillance program. It is not intended to diagnose MRSA infection nor to guide or monitor treatment for MRSA infections. Performed at [facility], [place] [address] [place], [place] [address]   Culture, blood (x 2)     Status: None   Collection Time: 08/28/18  7:09 PM  Result Value Ref Range Status   Specimen Description   Final    BLOOD LEFT HAND Performed  at [facility], [place] [address], [place], [place] [address]    Special Requests    Final    BOTTLES DRAWN AEROBIC AND ANAEROBIC Blood Culture adequate volume Performed at [place] [address], [place], [place] [address]    Culture   Final    NO GROWTH 5 DAYS Performed at [facility], [place] [address] [place], [place] [address]    Report Status 09/02/2018 FINAL  Final  Culture, blood (x 2)     Status: None   Collection Time: 08/28/18  7:15 PM  Result Value Ref Range Status   Specimen Description   Final    BLOOD RIGHT HAND Performed at [place] [address], [place], [place] [address]    Special Requests   Final    BOTTLES DRAWN AEROBIC AND ANAEROBIC Blood Culture adequate volume Performed at [place] [address], [place], [place] [address]    Culture   Final    NO GROWTH 5 DAYS Performed at [facility], [place] [address], [place], [place] [address]    Report Status 09/02/2018 FINAL  Final  Respiratory Panel by PCR     Status: None   Collection Time: 09/01/18 11:46 AM  Result Value Ref Range Status   Adenovirus NOT DETECTED NOT DETECTED Final   Coronavirus 229E NOT DETECTED NOT DETECTED Final   Coronavirus HKU1 NOT DETECTED NOT DETECTED Final   Coronavirus NL63 NOT DETECTED NOT DETECTED Final   Coronavirus OC43 NOT DETECTED NOT DETECTED Final   Metapneumovirus NOT DETECTED NOT DETECTED Final   Rhinovirus / Enterovirus NOT DETECTED NOT DETECTED Final   Influenza A NOT DETECTED NOT DETECTED Final   Influenza B NOT DETECTED NOT DETECTED Final   Parainfluenza Virus 1 NOT DETECTED NOT DETECTED Final   Parainfluenza Virus 2 NOT DETECTED NOT DETECTED Final   Parainfluenza Virus 3 NOT DETECTED NOT DETECTED Final   Parainfluenza Virus 4 NOT DETECTED NOT DETECTED Final   Respiratory Syncytial Virus NOT DETECTED NOT DETECTED Final   Bordetella pertussis NOT DETECTED NOT DETECTED Final   Chlamydophila pneumoniae NOT DETECTED NOT DETECTED Final   Mycoplasma pneumoniae NOT  DETECTED NOT DETECTED Final    Comment: Performed at [facility], [place] [address] [place], [place] [address]      Radiology Studies: Dg Chest Port 1 View  Result Date: 09/02/2018 CLINICAL DATA:  Hypoxia. EXAM: PORTABLE CHEST 1 VIEW COMPARISON:  CT 3 days ago 08/30/2018, radiograph 4 days ago 08/29/2018 FINDINGS: Right chest port with tip in the distal SVC. Unchanged heart size and mediastinal contours. Multifocal irregular airspace opacities with slight improvement on the right and no significant change on the left. Pleural effusions on prior CT are not well visualized on portable AP view. No pneumothorax. IMPRESSION: Multifocal bilateral pulmonary opacities with some improvement on the right and no significant change on the left. Findings may represent multifocal pneumonia or pulmonary edema. Pleural effusions on prior CT are not well visualized radiographically on AP view. Electronically Signed   By: Keith Rake M.D.   On: 09/02/2018 01:34      Scheduled Meds: . atorvastatin  10 mg Oral q1800  . Chlorhexidine Gluconate Cloth  6 each Topical Daily  . gabapentin  300 mg Oral TID  . insulin aspart  0-5 Units Subcutaneous QHS  . insulin aspart  0-9 Units Subcutaneous TID WC  . insulin aspart  5 Units Subcutaneous TID WC  .  insulin glargine  30 Units Subcutaneous QHS  . lisinopril  2.5 mg Oral Daily  . LORazepam  0.5 mg Intravenous Q8H  . mouth rinse  15 mL Mouth Rinse BID  . methylPREDNISolone (SOLU-MEDROL) injection  60 mg Intravenous Q6H  . morphine  15 mg Oral Daily  . multivitamin with minerals  1 tablet Oral Daily  . nystatin  5 mL Oral QID  . pantoprazole  40 mg Oral Daily  . protein supplement shake  11 oz Oral TID BM  . sodium chloride flush  3 mL Intravenous Q12H  . tamsulosin  0.4 mg Oral QPC breakfast   Continuous Infusions: . sodium chloride Stopped (08/30/18 0808)  . sodium chloride 10 mL/hr at 09/03/18 0641  . fluconazole (DIFLUCAN) IV Stopped (09/02/18  1142)  . levofloxacin (LEVAQUIN) IV Stopped (09/02/18 1653)  . sulfamethoxazole-trimethoprim Stopped (09/03/18 0314)  . vancomycin Stopped (09/02/18 2336)     LOS: 6 days   Time spent: Total of 35 minutes spent with pt, greater than 50% of which was spent in discussion of  treatment, counseling and coordination of care   Chipper Oman, MD Pager: Text Page via [email or web address]   If 7PM-7AM, please contact night-coverage [email or web address] 09/03/2018, 9:08 AM   Note - This record has been created using [employer]. Chart creation errors have been sought, but may not always have been located. Such creation errors do not reflect on the standard of medical care.

## 2018-09-03 NOTE — Progress Notes (Signed)
   09/03/18 0900  Clinical Encounter Type  Visited With Patient and family together  Visit Type Initial;Psychological support;Spiritual support;Critical Care  Referral From Nurse  Consult/Referral To Chaplain  Spiritual Encounters  Spiritual Needs Prayer;Emotional;Other (Comment) (Spiritual Care Conversation/Support)  Stress Factors  Patient Stress Factors Health changes;Major life changes  Family Stress Factors Health changes;Major life changes   I visited with the patient per referral from the nurse. I provided support for the patient and his family who were present at the bedside. The patient requested prayer. I prayed with the patient and his family. I also provided the patient with a prayer shawl.   Please, contact Spiritual Care for further assistance.   Chaplain Shanon Ace M.Div., [facility]

## 2018-09-03 NOTE — Progress Notes (Signed)
Nutrition Follow-up  DOCUMENTATION CODES:   (Will assess for malnutrition at follow-up)  INTERVENTION:  - Will continue to monitor Allen Glenn.   NUTRITION DIAGNOSIS:   Increased nutrient needs related to chronic illness, catabolic illness, cancer and cancer related treatments, acute illness as evidenced by estimated needs. -ongoing  GOAL:   Patient will meet greater than or equal to 90% of their needs -unmet/unable to meet   MONITOR:   PO intake, Supplement acceptance, Weight trends, Labs  ASSESSMENT:   62 year-old with PMH of stage 4 colon cancer with liver mets s/p R colectomy and last chemo on 08/19/18, HTN, and Type 2 DM. Patient admitted with dx of sepsis, pancytopenia, and neutropenia with fever.  No new weight since admission. No intakes documented since RD assessment on 9/6. Chart reviewed and patient remains on BiPAP at this time with removal only for mouth care and medication with notes indicating desaturation occurs during that time but improvement in O2 sats once BiPAP is replaced.   Palliative Care is following patient and last met with family at patient's bedside yesterday evening. Reviewed note from that encounter. Patient is DNR/DNI, not full comfort measures at this time. Palliate Care note indicates plan for follow-up today and about ongoing discussions about EOL.    Medications reviewed; 40 mg IV Lasix x1 dose yesterday, sliding scale Novolog, 5 units Novolog TID, 30 units Lantus/day, 60 mg Solu-medrol QID, daily multivitamin with minerals, 5 mL Mycostatin QID.  Labs reviewed; CBG: 237 mg/dL today, Na: 130 mmol/L, Cl: 94 mmol/L, creatinine: 0.4 mg/dL, Ca: 7.9 mg/dL, Mg: 2.6 mg/dL.     Diet Order:   Diet Order            Diet clear liquid Room service appropriate? Yes; Fluid consistency: Thin  Diet effective now              EDUCATION NEEDS:   No education needs have been identified at this time  Skin:  Skin Assessment: Reviewed RN Assessment  Last BM:   9/6  Height:   Ht Readings from Last 1 Encounters:  08/28/18 6' 2"  (1.88 m)    Weight:   Wt Readings from Last 1 Encounters:  08/28/18 84.2 kg    Ideal Body Weight:  86.36 kg  BMI:  Body mass index is 23.83 kg/m.  Estimated Nutritional Needs:   Kcal:  2525-2780 (30-33 kcal/kg)  Protein:  125-140 grams (~1.5-1.7 grams/kg)  Fluid:  >/= 2.5 L/day      Jarome Matin, MS, RD, LDN, [facility] Inpatient Clinical Dietitian Pager # [phone] After hours/weekend pager # [phone]

## 2018-09-03 NOTE — Progress Notes (Signed)
Patient ID: Allen Glenn, male   DOB: 11-Dec-1956, 62 y.o.   MRN: [id number]         [facility]ious Disease  Date of Admission:  08/28/2018    Day 7 vancomycin        Day 6 levofloxacin        Day 3 fluconazole        Day 2 trimethoprim sulfamethoxazole ASSESSMENT: His temperature is down overnight, most likely due to steroids, but he has chest x-ray shows slightly worsening bilateral infiltrates and he remains BiPAP dependent.  PLAN: 1. 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)   HTN (hypertension)   Insulin dependent type 2 diabetes mellitus, uncontrolled ([place])   Coronary artery disease   Hyperlipidemia   Hypertension   Anemia   DNR (do not resuscitate) discussion   Pancytopenia ([place])   Chemotherapy-induced neutropenia (H[facility])   Electrolyte imbalance   Atherosclerosis of aorta ([facility])   Hypoxia   Scheduled Meds: . atorvastatin  10 mg Oral q1800  . Chlorhexidine Gluconate Cloth  6 each Topical Daily  . furosemide  40 mg Intravenous Daily  . gabapentin  300 mg Oral TID  . insulin aspart  0-5 Units Subcutaneous QHS  . insulin aspart  0-9 Units Subcutaneous TID WC  . insulin aspart  5 Units Subcutaneous TID WC  . insulin glargine  30 Units Subcutaneous QHS  . lisinopril  2.5 mg Oral Daily  . LORazepam  0.5 mg Intravenous Q8H  . mouth rinse  15 mL Mouth Rinse BID  . methylPREDNISolone (SOLU-MEDROL) injection  60 mg Intravenous Q6H  . morphine  15 mg Oral Daily  . multivitamin with minerals  1 tablet Oral Daily  . nystatin  5 mL Oral QID  . pantoprazole  40 mg Oral Daily  . protein supplement shake  11 oz Oral TID BM   . sodium chloride flush  3 mL Intravenous Q12H  . tamsulosin  0.4 mg Oral QPC breakfast   Continuous Infusions: . sodium chloride Stopped (08/30/18 0808)  . sodium chloride 10 mL/hr at 09/03/18 0641  . fluconazole (DIFLUCAN) IV Stopped (09/03/18 1428)  . levofloxacin (LEVAQUIN) IV 750 mg (09/03/18 1407)  . sulfamethoxazole-trimethoprim Stopped (09/03/18 1225)  . vancomycin Stopped (09/03/18 1224)   PRN Meds:.sodium chloride, sodium chloride, acetaminophen **OR** acetaminophen, diphenoxylate-atropine, hydrALAZINE, levalbuterol, magic mouthwash, metoCLOPramide, metoprolol tartrate, morphine injection, nitroGLYCERIN, ondansetron **OR** ondansetron (ZOFRAN) IV, polyethylene glycol, sodium chloride flush, sodium chloride flush, traZODone  Review of Systems: Review of Systems  Unable to perform ROS: Critical illness    No Known Allergies  OBJECTIVE: Vitals:   09/03/18 0700 09/03/18 0800 09/03/18 1142 09/03/18 1200  BP:  (!) 148/70 (!) 148/70 (!) 142/76  Pulse: 94 96  100  Resp: (!) 41 (!) 34  (!) 36  Temp:  97.6 F (36.4 C)  97.7 F (36.5 C)  TempSrc:  Axillary  Axillary  SpO2: 91% 92%  92%  Weight:      Height:       Body mass index is 23.83 kg/m.  Physical Exam  Constitutional:  He is sleeping quietly in  bed.  No family are present.  Cardiovascular: Regular rhythm and normal heart sounds.  He is tachycardic.  Pulmonary/Chest:  BiPAP mask is in place.    Lab Results Lab Results  Component Value Date   WBC 6.0 09/03/2018   HGB 7.7 (L) 09/03/2018   HCT 23.5 (L) 09/03/2018   MCV 91.8 09/03/2018   PLT 93 (L) 09/03/2018    Lab Results  Component Value Date   CREATININE 0.40 (L) 09/03/2018   BUN 14 09/03/2018   NA 130 (L) 09/03/2018   K 4.6 09/03/2018   CL 94 (L) 09/03/2018   CO2 24 09/03/2018    Lab Results  Component Value Date   ALT 44 08/31/2018   AST 29 08/31/2018   ALKPHOS 249 (H) 08/31/2018   BILITOT 0.4 08/31/2018     Microbiology: Recent  Results (from the past 240 hour(s))  Culture, Blood     Status: None   Collection Time: 08/27/18  9:51 AM  Result Value Ref Range Status   Specimen Description BLOOD BLOOD RIGHT HAND PORTA CATH  Final   Special Requests   Final    BOTTLES DRAWN AEROBIC AND ANAEROBIC Blood Culture adequate volume   Culture   Final    NO GROWTH 5 DAYS Performed at [facility], [place] [address] [place], [place] [address]    Report Status 09/01/2018 FINAL  Final  Culture, Blood     Status: None   Collection Time: 08/27/18 10:10 AM  Result Value Ref Range Status   Specimen Description   Final    BLOOD RIGHT HAND Performed at [facility], [place] [address], [place], [place] [address]    Special Requests   Final    NONE Performed at [facility], [place] [address], [place], [place] [address]    Culture   Final    NO GROWTH 5 DAYS Performed at [facility], [place] [address] [place], [place] [address]    Report Status 09/01/2018 FINAL  Final  MRSA PCR Screening     Status: None   Collection Time: 08/28/18  1:45 PM  Result Value Ref Range Status   MRSA by PCR NEGATIVE NEGATIVE Final    Comment:        The GeneXpert MRSA Assay (FDA approved for NASAL specimens only), is one component of a comprehensive MRSA colonization surveillance program. It is not intended to diagnose MRSA infection nor to guide or monitor treatment for MRSA infections. Performed at [facility], [place] [address], [place], [place] [address]   Culture, blood (x 2)     Status: None   Collection Time: 08/28/18  7:09 PM  Result Value Ref Range Status   Specimen Description   Final    BLOOD LEFT HAND Performed at [place] [address], [place], [place] [address]    Special Requests   Final    BOTTLES DRAWN AEROBIC AND ANAEROBIC Blood Culture adequate volume Performed at [place] [address]  [place], [place] [address]    Culture   Final    NO GROWTH 5 DAYS Performed at [facility], [place] [address] [place], [place] [address]    Report Status 09/02/2018 FINAL  Final  Culture, blood (x 2)     Status: None   Collection Time: 08/28/18  7:15 PM  Result Value Ref Range Status   Specimen Description   Final  BLOOD RIGHT HAND Performed at [facility], [place] [address] [place], [place] [address]    Special Requests   Final    BOTTLES DRAWN AEROBIC AND ANAEROBIC Blood Culture adequate volume Performed at [place] [address], [place], [place] [address]    Culture   Final    NO GROWTH 5 DAYS Performed at [facility], [place] [address], [place], [place] [address]    Report Status 09/02/2018 FINAL  Final  Respiratory Panel by PCR     Status: None   Collection Time: 09/01/18 11:46 AM  Result Value Ref Range Status   Adenovirus NOT DETECTED NOT DETECTED Final   Coronavirus 229E NOT DETECTED NOT DETECTED Final   Coronavirus HKU1 NOT DETECTED NOT DETECTED Final   Coronavirus NL63 NOT DETECTED NOT DETECTED Final   Coronavirus OC43 NOT DETECTED NOT DETECTED Final   Metapneumovirus NOT DETECTED NOT DETECTED Final   Rhinovirus / Enterovirus NOT DETECTED NOT DETECTED Final   Influenza A NOT DETECTED NOT DETECTED Final   Influenza B NOT DETECTED NOT DETECTED Final   Parainfluenza Virus 1 NOT DETECTED NOT DETECTED Final   Parainfluenza Virus 2 NOT DETECTED NOT DETECTED Final   Parainfluenza Virus 3 NOT DETECTED NOT DETECTED Final   Parainfluenza Virus 4 NOT DETECTED NOT DETECTED Final   Respiratory Syncytial Virus NOT DETECTED NOT DETECTED Final   Bordetella pertussis NOT DETECTED NOT DETECTED Final   Chlamydophila pneumoniae NOT DETECTED NOT DETECTED Final   Mycoplasma pneumoniae NOT DETECTED NOT DETECTED Final    Comment: Performed at [facility], [place] [address], [place], [place] [address]    Michel Bickers, [facility] [place] [phone] pager   [phone] cell 09/03/2018, 2:50 PM

## 2018-09-04 ENCOUNTER — Other Ambulatory Visit: Payer: Self-pay | Admitting: *Deleted

## 2018-09-04 ENCOUNTER — Inpatient Hospital Stay: Payer: 59

## 2018-09-04 ENCOUNTER — Inpatient Hospital Stay (HOSPITAL_COMMUNITY): Payer: 59

## 2018-09-04 LAB — CBC WITH DIFFERENTIAL/PLATELET
BASOS ABS: 0 10*3/uL (ref 0.0–0.1)
BASOS PCT: 0 %
Eosinophils Absolute: 0 10*3/uL (ref 0.0–0.7)
Eosinophils Relative: 0 %
HCT: 21.5 % — ABNORMAL LOW (ref 39.0–52.0)
Hemoglobin: 7 g/dL — ABNORMAL LOW (ref 13.0–17.0)
Lymphocytes Relative: 3 %
Lymphs Abs: 0.2 10*3/uL — ABNORMAL LOW (ref 0.7–4.0)
MCH: 29.7 pg (ref 26.0–34.0)
MCHC: 32.6 g/dL (ref 30.0–36.0)
MCV: 91.1 fL (ref 78.0–100.0)
Monocytes Absolute: 0.3 10*3/uL (ref 0.1–1.0)
Monocytes Relative: 5 %
NEUTROS PCT: 92 %
Neutro Abs: 6 10*3/uL (ref 1.7–7.7)
PLATELETS: 80 10*3/uL — AB (ref 150–400)
RBC: 2.36 MIL/uL — AB (ref 4.22–5.81)
RDW: 18.3 % — ABNORMAL HIGH (ref 11.5–15.5)
WBC: 6.5 10*3/uL (ref 4.0–10.5)

## 2018-09-04 LAB — GLUCOSE, CAPILLARY
GLUCOSE-CAPILLARY: 302 mg/dL — AB (ref 70–99)
GLUCOSE-CAPILLARY: 197 mg/dL — AB (ref 70–99)
Glucose-Capillary: 233 mg/dL — ABNORMAL HIGH (ref 70–99)
Glucose-Capillary: 207 mg/dL — ABNORMAL HIGH (ref 70–99)
Glucose-Capillary: 234 mg/dL — ABNORMAL HIGH (ref 70–99)

## 2018-09-04 LAB — MAGNESIUM: Magnesium: 2.5 mg/dL — ABNORMAL HIGH (ref 1.7–2.4)

## 2018-09-04 LAB — BASIC METABOLIC PANEL
ANION GAP: 10 (ref 5–15)
BUN: 15 mg/dL (ref 8–23)
CALCIUM: 7.8 mg/dL — AB (ref 8.9–10.3)
CO2: 24 mmol/L (ref 22–32)
Chloride: 94 mmol/L — ABNORMAL LOW (ref 98–111)
Creatinine, Ser: 0.36 mg/dL — ABNORMAL LOW (ref 0.61–1.24)
GLUCOSE: 289 mg/dL — AB (ref 70–99)
Potassium: 4.7 mmol/L (ref 3.5–5.1)
SODIUM: 128 mmol/L — AB (ref 135–145)

## 2018-09-04 MED ORDER — LORAZEPAM 2 MG/ML IJ SOLN
0.5000 mg | INTRAMUSCULAR | Status: DC
  Administered 2018-09-04 – 2018-09-07 (×7): 0.5 mg via INTRAVENOUS
  Filled 2018-09-04 (×7): qty 1

## 2018-09-04 MED ORDER — LORAZEPAM 2 MG/ML IJ SOLN
1.0000 mg | Freq: Every day | INTRAMUSCULAR | Status: DC
  Administered 2018-09-04 – 2018-09-10 (×7): 1 mg via INTRAVENOUS
  Filled 2018-09-04 (×8): qty 1

## 2018-09-04 MED ORDER — POLYVINYL ALCOHOL 1.4 % OP SOLN
1.0000 [drp] | Freq: Three times a day (TID) | OPHTHALMIC | Status: DC
  Administered 2018-09-04 – 2018-09-09 (×7): 1 [drp] via OPHTHALMIC
  Filled 2018-09-04 (×2): qty 15

## 2018-09-04 MED ORDER — LORATADINE 10 MG PO TABS
10.0000 mg | ORAL_TABLET | Freq: Every day | ORAL | Status: DC
  Administered 2018-09-04 – 2018-09-07 (×4): 10 mg via ORAL
  Filled 2018-09-04 (×4): qty 1

## 2018-09-04 NOTE — Progress Notes (Signed)
Patient ID: Allen Glenn, male   DOB: 1956/03/12, 62 y.o.   MRN: [id number]          [facility] for Infectious Disease    Date of Admission:  08/28/2018   Total days of antibiotics 8         Allen Glenn remains critically ill on 90% BiPAP.  He has been requesting something to eat and drink.  He desaturates quickly when moving his BiPAP mask to drink orange juice.  He does seem to be comfortable.  His son is at the bedside.  We do not have any new data to guide empiric antimicrobial therapy.  I will continue his current 4 drug regimen for now.         Michel Bickers, MD [facility] for Infect[facility] [place] [facility] [phone] pager   [phone] cell 09/04/2018, 1:23 PM

## 2018-09-04 NOTE — Progress Notes (Signed)
PROGRESS NOTE [facility]   Allen Glenn   ZOX:[id number] DOB: 1956/08/02  DOA: 08/28/2018 PCP: [name], MD   Brief Narrative:  Allen Glenn is a 62 year old male with past medical history of diabetes, CAD, hypertension and metastatic colon cancer on chemotherapy 8/27 who presented to the emergency department complaining of fever, fatigue and tachycardia.  Upon ED evaluation patient was found to be hypoxic, elevated lactic acid and febrile.  Patient was admitted with working diagnosis of sepsis of unknown origin and started on empiric antibiotics. Found to have severe bilateral pneumonia. Decompensated started on BiPAP, have developed a picture of ARDS however patient DNR/DNI. ID and PCCM consulted    Subjective: Patient seen and examined, he seems more alert and talkative today.  His oxygenation has improved overnight.  However continues to be dependent on BiPAP.  Remains afebrile for 48 hours.  Family at bedside.  Chest x-ray discussed.   Assessment & Plan: Sepsis due to pneumonia Bilateral PNA, no changes in status today. CT chest performed 9/7 shows extensive opacities throughout both lungs. Small bilateral pleural effusion and bilateral lung nodule compatible with metastatic disease.  Chest x-ray 9/11 with worsening signs of infection/edema and possible ARDS.  Chest x-ray 9/12 reviewed by me with mild improvement in the right upper lung otherwise remain unchanged. Some improvement in oxygenation overnight, will continue Lasix daily 40 mg, monitor renal function closely.  Continue BiPAP if oxygenation remains stable can start titrating FiO2.  Continue current antibiotic therapy with Bactrim, vancomycin, Levaquin and Diflucan.  Continue Solu-Medrol 60 mg every 6 hours for possible pneumonitis and start taper if able.  Continue supportive treatment with Ativan and morphine for tachypnea.  ID following.  Neutropenia improved.  Acute respiratory failure with hypoxia Multifactorial  due to multifocal pneumonia, fluid overload and now with poss ARDS.  Patient and family decided on DNR/DNI.  Patient remains dependent on BiPAP, will try to wean FiO2 if oxygenation remained above 95%, if does not respond he might need to opt for comfort care measures.  Palliative care recommendations appreciated  Thrombocytopenia - stable  In setting of chemotherapy and sepsis.  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 Management per Oncology   HTN BP remains stable, continue lisinopril.  Sinus tachycardia  Tachycardia has improved, felt to be related to sepsis and respiratory failure.  Echo with no abnormalities.  IV metoprolol as needed for heart rate above 130 bpm's.  Type 2 DM   Last A1c 7.9.  CBGs now elevated in setting of high-dose steroids.  Patient n.p.o. will continue sliding scale for now.  Continue to monitor CBGs, suspect to improve with tapering steroids.  Goals of cares DNR/DNI, continue to monitor over the next 24 hours if no improvement will might benefit of for comfort care.  DVT prophylaxis: SCD's Code Status: DNR/DNI Family Communication: Family at bedside Disposition Plan: Continue to monitor in SDU   Consultants:   Oncology   Procedures:   None   Antimicrobials: Anti-infectives (From admission, onward)   Start     Dose/Rate Route Frequency Ordered Stop   09/01/18 1800  sulfamethoxazole-trimethoprim (BACTRIM) 420 mg in dextrose 5 % 500 mL IVPB     420 mg 350.8 mL/hr over 90 Minutes Intravenous Every 8 hours 09/01/18 1529     09/01/18 1000  fluconazole (DIFLUCAN) IVPB 400 mg     400 mg 100 mL/hr over 120 Minutes Intravenous  Every 24 hours 09/01/18 0838     08/31/18 2200  vancomycin (VANCOCIN) 1,250 mg in sodium chloride 0.9 % 250 mL IVPB     1,250 mg 166.7 mL/hr over 90 Minutes Intravenous Every 12 hours 08/31/18 0852     08/31/18 1000  meropenem (MERREM) 1 g in sodium  chloride 0.9 % 100 mL IVPB  Status:  Discontinued     1 g 200 mL/hr over 30 Minutes Intravenous Every 8 hours 08/31/18 0848 09/01/18 1546   08/31/18 1000  vancomycin (VANCOCIN) 1,750 mg in sodium chloride 0.9 % 500 mL IVPB     1,750 mg 250 mL/hr over 120 Minutes Intravenous  Once 08/31/18 0852 08/31/18 1146   08/29/18 1400  levofloxacin (LEVAQUIN) IVPB 750 mg     750 mg 100 mL/hr over 90 Minutes Intravenous Every 24 hours 08/29/18 1353     08/28/18 2000  vancomycin (VANCOCIN) 1,250 mg in sodium chloride 0.9 % 250 mL IVPB  Status:  Discontinued     1,250 mg 166.7 mL/hr over 90 Minutes Intravenous Every 12 hours 08/28/18 1725 08/30/18 0853   08/28/18 1500  piperacillin-tazobactam (ZOSYN) IVPB 3.375 g  Status:  Discontinued     3.375 g 12.5 mL/hr over 240 Minutes Intravenous Every 8 hours 08/28/18 1245 08/31/18 0844   08/28/18 0900  vancomycin (VANCOCIN) 1,500 mg in sodium chloride 0.9 % 500 mL IVPB     1,500 mg 250 mL/hr over 120 Minutes Intravenous  Once 08/28/18 0854 08/28/18 1218   08/28/18 0900  piperacillin-tazobactam (ZOSYN) IVPB 3.375 g     3.375 g 100 mL/hr over 30 Minutes Intravenous  Once 08/28/18 0854 08/28/18 0935      Objective: Vitals:   09/04/18 0330 09/04/18 0400 09/04/18 0800 09/04/18 1145  BP: 114/63 (!) 105/56 125/63   Pulse: 88 87 86   Resp: (!) 28 (!) 26 (!) 27   Temp:  97.7 F (36.5 C) (!) 97.3 F (36.3 C) 97.8 F (36.6 C)  TempSrc:  Axillary Axillary Axillary  SpO2: 94% 95% 94%   Weight:      Height:        Intake/Output Summary (Last 24 hours) at 09/04/2018 1238 Last data filed at 09/04/2018 0600 Gross per 24 hour  Intake 2669.63 ml  Output 2700 ml  Net -30.37 ml   Filed Weights   08/28/18 1400  Weight: 84.2 kg    Examination:  General: Pt is alert, awake, not in acute distress Cardiovascular: RRR, S1/S2 +, no rubs, no gallops Respiratory: Decreased breath sounds mainly at the bases, air entry mild improvement in the upper lungs.  Bibasilar  rales.  Abdominal breathing.  No accessory muscle use.  No wheezing Abdominal: Soft, NT, ND, bowel sounds + Extremities: no edema Neuro: AAO x3 Skin: No rashes  Data Reviewed: I have personally reviewed following labs and imaging studies  CBC: Recent Labs  Lab 08/31/18 0530 09/01/18 0555 09/02/18 0615 09/03/18 0336 09/04/18 0323  WBC 1.7* 3.0* 5.8 6.0 6.5  NEUTROABS 1.5* 2.6 5.4 5.6 6.0  HGB 8.3* 8.7* 8.4* 7.7* 7.0*  HCT 25.0* 26.3* 25.1* 23.5* 21.5*  MCV 91.6 91.6 91.3 91.8 91.1  PLT 78* 93* 100* 93* 80*   Basic Metabolic Panel: Recent Labs  Lab 08/31/18 0530 09/01/18 0555 09/02/18 0615 09/03/18 0336 09/04/18 0323  NA 133* 136 131* 130* 128*  K 4.0 3.9 4.2 4.6 4.7  CL 98 101 95* 94* 94*  CO2 24 25 24 24 24   GLUCOSE 131* 96 146* 294*  289*  BUN 16 17 15 14 15   CREATININE 0.33* 0.37* 0.37* 0.40* 0.36*  CALCIUM 7.9* 7.9* 7.9* 7.9* 7.8*  MG 2.0 1.9 2.4 2.6* 2.5*   GFR: Estimated Creatinine Clearance: 112.7 mL/min (A) (by C-G formula based on SCr of 0.36 mg/dL (L)). Liver Function Tests: Recent Labs  Lab 08/30/18 0630 08/31/18 0530  AST  --  29  ALT  --  44  ALKPHOS  --  249*  BILITOT  --  0.4  PROT  --  5.3*  ALBUMIN 1.9* 1.9*   No results for input(s): LIPASE, AMYLASE in the last 168 hours. No results for input(s): AMMONIA in the last 168 hours. Coagulation Profile: No results for input(s): INR, PROTIME in the last 168 hours. Cardiac Enzymes: No results for input(s): CKTOTAL, CKMB, CKMBINDEX, TROPONINI in the last 168 hours. BNP (last 3 results) No results for input(s): PROBNP in the last 8760 hours. HbA1C: No results for input(s): HGBA1C in the last 72 hours. CBG: Recent Labs  Lab 09/03/18 2026 09/03/18 2351 09/04/18 0331 09/04/18 0743 09/04/18 1138  GLUCAP 315* 234* 233* 207* 302*   Lipid Profile: No results for input(s): CHOL, HDL, LDLCALC, TRIG, CHOLHDL, LDLDIRECT in the last 72 hours. Thyroid Function Tests: No results for input(s): TSH,  T4TOTAL, FREET4, T3FREE, THYROIDAB in the last 72 hours. Anemia Panel: No results for input(s): VITAMINB12, FOLATE, FERRITIN, TIBC, IRON, RETICCTPCT in the last 72 hours. Sepsis Labs: Recent Labs  Lab 08/28/18 1909 08/29/18 1316 08/31/18 0530 09/01/18 0555  PROCALCITON  --  0.93 3.20 2.28  LATICACIDVEN 1.9  --   --   --     Recent Results (from the past 240 hour(s))  Culture, Blood     Status: None   Collection Time: 08/27/18  9:51 AM  Result Value Ref Range Status   Specimen Description BLOOD BLOOD RIGHT HAND PORTA CATH  Final   Special Requests   Final    BOTTLES DRAWN AEROBIC AND ANAEROBIC Blood Culture adequate volume   Culture   Final    NO GROWTH 5 DAYS Performed at [facility], [address] [place], [place] [address]    Report Status 09/01/2018 FINAL  Final  Culture, Blood     Status: None   Collection Time: 08/27/18 10:10 AM  Result Value Ref Range Status   Specimen Description   Final    BLOOD RIGHT HAND Performed at [facility], [place] [address], [place], [place] [address]    Special Requests   Final    NONE Performed at [facility], [place] [address], [place], [place] [address]    Culture   Final    NO GROWTH 5 DAYS Performed at [facility], [place] [address], [place], [place] [address]    Report Status 09/01/2018 FINAL  Final  MRSA PCR Screening     Status: None   Collection Time: 08/28/18  1:45 PM  Result Value Ref Range Status   MRSA by PCR NEGATIVE NEGATIVE Final    Comment:        The GeneXpert MRSA Assay (FDA approved for NASAL specimens only), is one component of a comprehensive MRSA colonization surveillance program. It is not intended to diagnose MRSA infection nor to guide or monitor treatment for MRSA infections. Performed at [facility], [place] [address] [place], [place] [address]   Culture, blood (x 2)     Status: None   Collection Time: 08/28/18  7:09 PM    Result Value Ref Range Status   Specimen Description   Final    BLOOD LEFT HAND Performed at [place] [address] [place], [place] [address]    Special Requests   Final    BOTTLES DRAWN AEROBIC AND ANAEROBIC Blood Culture adequate volume Performed at [place] [address] [place], [place] [address]    Culture   Final    NO GROWTH 5 DAYS Performed at [facility], [place] [address], [place], [place] [address]    Report Status 09/02/2018 FINAL  Final  Culture, blood (x 2)     Status: None   Collection Time: 08/28/18  7:15 PM  Result Value Ref Range Status   Specimen Description   Final    BLOOD RIGHT HAND Performed at [place] [address] [place], [place] [address]    Special Requests   Final    BOTTLES DRAWN AEROBIC AND ANAEROBIC Blood Culture adequate volume Performed at [place] [address], [place], [place] [address]    Culture   Final    NO GROWTH 5 DAYS Performed at [facility], [place] [address] [place], [place] [address]    Report Status 09/02/2018 FINAL  Final  Respiratory Panel by PCR     Status: None   Collection Time: 09/01/18 11:46 AM  Result Value Ref Range Status   Adenovirus NOT DETECTED NOT DETECTED Final   Coronavirus 229E NOT DETECTED NOT DETECTED Final   Coronavirus HKU1 NOT DETECTED NOT DETECTED Final   Coronavirus NL63 NOT DETECTED NOT DETECTED Final   Coronavirus OC43 NOT DETECTED NOT DETECTED Final   Metapneumovirus NOT DETECTED NOT DETECTED Final   Rhinovirus / Enterovirus NOT DETECTED NOT DETECTED Final   Influenza A NOT DETECTED NOT DETECTED Final   Influenza B NOT DETECTED NOT DETECTED Final   Parainfluenza Virus 1 NOT DETECTED NOT DETECTED Final   Parainfluenza Virus 2 NOT DETECTED NOT DETECTED Final   Parainfluenza Virus 3 NOT DETECTED NOT DETECTED Final   Parainfluenza Virus 4 NOT DETECTED NOT DETECTED Final   Respiratory  Syncytial Virus NOT DETECTED NOT DETECTED Final   Bordetella pertussis NOT DETECTED NOT DETECTED Final   Chlamydophila pneumoniae NOT DETECTED NOT DETECTED Final   Mycoplasma pneumoniae NOT DETECTED NOT DETECTED Final    Comment: Performed at [facility], [place] [address] [place],  [address]     Radiology Studies: Dg Chest Port 1 View  Result Date: 09/04/2018 CLINICAL DATA:  Hypoxia EXAM: PORTABLE CHEST 1 VIEW COMPARISON:  09/03/2018 FINDINGS: Right Port-A-Cath remains in place, unchanged. Stable diffuse bilateral airspace disease. Heart is borderline in size. No visible significant effusions or pneumothorax. IMPRESSION: Stable diffuse bilateral airspace disease. Electronically Signed   By: Rolm Baptise M.D.   On: 09/04/2018 07:58   Dg Chest Port 1 View  Result Date: 09/03/2018 CLINICAL DATA:  Hypoxia EXAM: PORTABLE CHEST 1 VIEW COMPARISON:  09/02/2018 FINDINGS: Diffuse bilateral airspace disease has worsened since prior study. Borderline cardiomegaly. No visible effusions. No acute bony abnormality. Right Port-A-Cath remains in place, unchanged. IMPRESSION: Worsening diffuse bilateral airspace disease which could reflect edema or infection. Electronically Signed   By: Rolm Baptise M.D.   On: 09/03/2018 11:29    Scheduled Meds: . atorvastatin  10 mg Oral q1800  . Chlorhexidine Gluconate Cloth  6 each Topical Daily  . furosemide  40 mg Intravenous Daily  .  gabapentin  300 mg Oral TID  . insulin aspart  0-5 Units Subcutaneous QHS  . insulin aspart  0-9 Units Subcutaneous TID WC  . insulin aspart  5 Units Subcutaneous TID WC  . insulin glargine  30 Units Subcutaneous QHS  . lisinopril  2.5 mg Oral Daily  . LORazepam  0.5 mg Intravenous Q8H  . mouth rinse  15 mL Mouth Rinse BID  . methylPREDNISolone (SOLU-MEDROL) injection  60 mg Intravenous Q6H  . morphine  15 mg Oral Daily  . multivitamin with minerals  1 tablet Oral Daily  . nystatin  5 mL Oral QID  . pantoprazole  40 mg  Oral Daily  . protein supplement shake  11 oz Oral TID BM  . sodium chloride flush  3 mL Intravenous Q12H  . tamsulosin  0.4 mg Oral QPC breakfast   Continuous Infusions: . sodium chloride Stopped (08/30/18 0808)  . sodium chloride Stopped (09/03/18 1407)  . fluconazole (DIFLUCAN) IV 400 mg (09/04/18 1237)  . levofloxacin (LEVAQUIN) IV Stopped (09/03/18 1537)  . sulfamethoxazole-trimethoprim 420 mg (09/04/18 1031)  . vancomycin 1,250 mg (09/04/18 1035)     LOS: 7 days   Time spent: Total of 35 minutes spent with pt, greater than 50% of which was spent in discussion of  treatment, counseling and coordination of care   Chipper Oman, MD Pager: Text Page via [email or web address]   If 7PM-7AM, please contact night-coverage [email or web address] 09/04/2018, 12:38 PM   Note - This record has been created using [employer]. Chart creation errors have been sought, but may not always have been located. Such creation errors do not reflect on the standard of medical care.

## 2018-09-04 NOTE — Patient Outreach (Addendum)
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 [name] has known stage IV colon cancer with metastatic disease to the liver (05/04/15), ? 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  He received his first cycle of FOLFIRI (Folinic Acid, Fluorouracil & Irinotecan) chemotherapy on 08/19/18 Update in today's call included; recent admission on 08/28/18 to  [facility] for fever ,sepsis with elevated lactic acid , hypoxia, tachypnea and tachycardia with bilateral lung infiltrates and pancytopenia.  A palliative care consult ws completed on 09/01/18.  On 9/10, he was seen by critical care medicine due to worsening respiratory failure and  BiPAP was initiated with patent and family agreeing  to continued current medications/drips but no CPR, ACLS or intubation.  Due to his continued decline, his goals of care were changed to primarily comfort measures on 9/11 with the expectation he will not survive this hospitalization.  A chaplain visited with the patient and family on 9/11 at the bedside nurse's request. This RNCM will continue to provide updates on the monthly UMR conference calls as long as patient's case remains active with UMR.  Barrington Ellison RN,CCM,CDE [facility] Management Coordinator Office Phone  [phone] Office Fax [phone]

## 2018-09-04 NOTE — Progress Notes (Signed)
Pharmacy Antibiotic Note  Allen Glenn is a 62 y.o. male with metastatic colon cancer undergoing chemotherapy treatment, presented to the ED on with sepsis. ID currently seeing patient.  He's on vancomycin, levaquin, diflucan and bactrim fpr broad coverage.  Today, 09/04/2018: - day #8 vancomycin, day #7 levaquin, day #4 diflucan, day #3 bactrim  - afeb, wbc wnl, scr stable at 0.36 (crcl~100) - ANC 6 - all cultures have been negative thus far  Plan: - continue fluconazole 400 mg q24h, levaquin 750 mg IV q24h, bactrim 420 mg IV q8h, and vancomycin 1250 mg IV q12h - Will check vancomycin trough and peak level with the 10a dose on 9/13  __________________________________ Height: 6\' 2"  (188 cm) Weight: 185 lb 10 oz (84.2 kg) IBW/kg (Calculated) : 82.2  Temp (24hrs), Avg:97.5 F (36.4 C), Min:97.1 F (36.2 C), Max:97.8 F (36.6 C)  Recent Labs  Lab 08/28/18 1909  08/31/18 0530 09/01/18 0555 09/02/18 0615 09/03/18 0336 09/04/18 0323  WBC  --    < > 1.7* 3.0* 5.8 6.0 6.5  CREATININE  --    < > 0.33* 0.37* 0.37* 0.40* 0.36*  LATICACIDVEN 1.9  --   --   --   --   --   --    < > = values in this interval not displayed.    Estimated Creatinine Clearance: 112.7 mL/min (A) (by C-G formula based on SCr of 0.36 mg/dL (L)).    No Known Allergies  Antimicrobials this admission: 9/5 Vancomycin >> 9/7, resumed 9/8 >> 9/5 Zosyn >> 9/8 9/6 Levofloxacin >>  9/8 Meropenem >> 9/9 9/9 Diflucan >> 9/9 Bactrim >>   Dose adjustments this admission: --   Microbiology results: 9/4 BCx (at [facility]): neg FINAL 9/5 MRSA PCR: negative  9/5 BCx x2: neg FINAL 9/6 Urine legionella Ag: negative 9/6 Urine strep pneumo Ag: neg 9/9 Respiratory virus panel: negative  Thank you for allowing pharmacy to be a part of this patient's care.  Dia Sitter P 09/04/2018 1:32 PM

## 2018-09-04 NOTE — Progress Notes (Signed)
Palliative care progress note  Patient ID: Allen Glenn, male   DOB: 1956/05/14, 62 y.o.   MRN: [id number]  Chart reviewed, including pertinent labs and imaging.  I saw and examined Allen Glenn.  Patient appears comfortable on BiPap and denies any needs or complaints.  He is more awake and talkative today.  Patient continues to be BiPap dependant. He is able to communicate with family.   Met with patient, his wife, and his children.  Emotional support offered.  Wife reports understanding that he remains very ill, but is thankful that he appears to be feeling better and is more awake today.   Patient's main concern is being hungry and he has been requesting a milkshake.  Discussed concern that he is not able to be off Bipap for any period of time at this point, and taking in PO while on Bipap carries high risk of aspiration.  Plan to continue Bipap, but if it appears there becomes a point where his comfort is being compromised, will need further discussion.   Plan to continue current interventions and continue to reassess his situation.    Questions and concerns addressed     Total time: 30  minutes  Greater than 50% of the time was spent in counseling and coordination of care  Micheline Rough, MD [place] Team [phone]

## 2018-09-05 ENCOUNTER — Inpatient Hospital Stay (HOSPITAL_COMMUNITY): Payer: 59

## 2018-09-05 ENCOUNTER — Other Ambulatory Visit: Payer: 59

## 2018-09-05 DIAGNOSIS — J189 Pneumonia, unspecified organism: Secondary | ICD-10-CM | POA: Diagnosis not present

## 2018-09-05 DIAGNOSIS — R509 Fever, unspecified: Secondary | ICD-10-CM

## 2018-09-05 DIAGNOSIS — C7951 Secondary malignant neoplasm of bone: Secondary | ICD-10-CM | POA: Diagnosis not present

## 2018-09-05 DIAGNOSIS — C189 Malignant neoplasm of colon, unspecified: Secondary | ICD-10-CM | POA: Diagnosis not present

## 2018-09-05 DIAGNOSIS — D61818 Other pancytopenia: Secondary | ICD-10-CM

## 2018-09-05 DIAGNOSIS — I700 Atherosclerosis of aorta: Secondary | ICD-10-CM

## 2018-09-05 DIAGNOSIS — J9621 Acute and chronic respiratory failure with hypoxia: Secondary | ICD-10-CM | POA: Diagnosis not present

## 2018-09-05 DIAGNOSIS — E878 Other disorders of electrolyte and fluid balance, not elsewhere classified: Secondary | ICD-10-CM

## 2018-09-05 DIAGNOSIS — C7802 Secondary malignant neoplasm of left lung: Secondary | ICD-10-CM

## 2018-09-05 DIAGNOSIS — J80 Acute respiratory distress syndrome: Secondary | ICD-10-CM

## 2018-09-05 DIAGNOSIS — E43 Unspecified severe protein-calorie malnutrition: Secondary | ICD-10-CM | POA: Diagnosis not present

## 2018-09-05 DIAGNOSIS — A419 Sepsis, unspecified organism: Secondary | ICD-10-CM | POA: Diagnosis not present

## 2018-09-05 DIAGNOSIS — D649 Anemia, unspecified: Secondary | ICD-10-CM

## 2018-09-05 DIAGNOSIS — C787 Secondary malignant neoplasm of liver and intrahepatic bile duct: Secondary | ICD-10-CM | POA: Diagnosis not present

## 2018-09-05 DIAGNOSIS — C7931 Secondary malignant neoplasm of brain: Secondary | ICD-10-CM | POA: Diagnosis not present

## 2018-09-05 DIAGNOSIS — C7800 Secondary malignant neoplasm of unspecified lung: Secondary | ICD-10-CM | POA: Diagnosis not present

## 2018-09-05 LAB — BASIC METABOLIC PANEL
ANION GAP: 13 (ref 5–15)
BUN: 16 mg/dL (ref 8–23)
CO2: 22 mmol/L (ref 22–32)
Calcium: 7.8 mg/dL — ABNORMAL LOW (ref 8.9–10.3)
Chloride: 96 mmol/L — ABNORMAL LOW (ref 98–111)
Creatinine, Ser: 0.41 mg/dL — ABNORMAL LOW (ref 0.61–1.24)
GFR calc Af Amer: >60 mL/min (ref >60)
GFR calc non Af Amer: >60 mL/min (ref >60)
GLUCOSE: 271 mg/dL — AB (ref 70–99)
POTASSIUM: 4.8 mmol/L (ref 3.5–5.1)
Sodium: 131 mmol/L — ABNORMAL LOW (ref 135–145)

## 2018-09-05 LAB — CBC WITH DIFFERENTIAL/PLATELET
Basophils Absolute: 0 10*3/uL (ref 0.0–0.1)
Basophils Relative: 0 %
Eosinophils Absolute: 0 10*3/uL (ref 0.0–0.7)
Eosinophils Relative: 0 %
HEMATOCRIT: 23.2 % — AB (ref 39.0–52.0)
Hemoglobin: 7.7 g/dL — ABNORMAL LOW (ref 13.0–17.0)
LYMPHS ABS: 0.2 10*3/uL — AB (ref 0.7–4.0)
Lymphocytes Relative: 2 %
MCH: 30.3 pg (ref 26.0–34.0)
MCHC: 33.2 g/dL (ref 30.0–36.0)
MCV: 91.3 fL (ref 78.0–100.0)
MONO ABS: 0.3 10*3/uL (ref 0.1–1.0)
MONOS PCT: 4 %
NEUTROS ABS: 7.4 10*3/uL (ref 1.7–7.7)
NEUTROS PCT: 94 %
Platelets: 78 10*3/uL — ABNORMAL LOW (ref 150–400)
RBC: 2.54 MIL/uL — ABNORMAL LOW (ref 4.22–5.81)
RDW: 18.4 % — ABNORMAL HIGH (ref 11.5–15.5)
WBC: 7.9 10*3/uL (ref 4.0–10.5)

## 2018-09-05 LAB — GLUCOSE, CAPILLARY
GLUCOSE-CAPILLARY: 260 mg/dL — AB (ref 70–99)
GLUCOSE-CAPILLARY: 273 mg/dL — AB (ref 70–99)
GLUCOSE-CAPILLARY: 332 mg/dL — AB (ref 70–99)
Glucose-Capillary: 356 mg/dL — ABNORMAL HIGH (ref 70–99)
Glucose-Capillary: 257 mg/dL — ABNORMAL HIGH (ref 70–99)

## 2018-09-05 LAB — VANCOMYCIN, TROUGH: Vancomycin Tr: 15 ug/mL (ref 15–20)

## 2018-09-05 LAB — VANCOMYCIN, PEAK: Vancomycin Pk: 37 ug/mL (ref 30–40)

## 2018-09-05 MED ORDER — INSULIN GLARGINE 100 UNIT/ML ~~LOC~~ SOLN
33.0000 [IU] | Freq: Every day | SUBCUTANEOUS | Status: DC
  Administered 2018-09-05 – 2018-09-15 (×11): 33 [IU] via SUBCUTANEOUS
  Filled 2018-09-05 (×11): qty 0.33

## 2018-09-05 MED ORDER — ORAL CARE MOUTH RINSE
15.0000 mL | Freq: Two times a day (BID) | OROMUCOSAL | Status: DC
  Administered 2018-09-05 – 2018-09-21 (×21): 15 mL via OROMUCOSAL

## 2018-09-05 MED ORDER — VANCOMYCIN HCL IN DEXTROSE 1-5 GM/200ML-% IV SOLN
1000.0000 mg | Freq: Two times a day (BID) | INTRAVENOUS | Status: DC
  Administered 2018-09-06 – 2018-09-10 (×10): 1000 mg via INTRAVENOUS
  Filled 2018-09-05 (×9): qty 200

## 2018-09-05 MED ORDER — CHLORHEXIDINE GLUCONATE 0.12 % MT SOLN
15.0000 mL | Freq: Two times a day (BID) | OROMUCOSAL | Status: DC
  Administered 2018-09-05 – 2018-09-22 (×28): 15 mL via OROMUCOSAL
  Filled 2018-09-05 (×30): qty 15

## 2018-09-05 MED ORDER — INSULIN ASPART 100 UNIT/ML ~~LOC~~ SOLN
0.0000 [IU] | SUBCUTANEOUS | Status: DC
  Administered 2018-09-05: 8 [IU] via SUBCUTANEOUS
  Administered 2018-09-06 (×2): 3 [IU] via SUBCUTANEOUS
  Administered 2018-09-06: 8 [IU] via SUBCUTANEOUS
  Administered 2018-09-06: 11 [IU] via SUBCUTANEOUS
  Administered 2018-09-06 (×2): 3 [IU] via SUBCUTANEOUS
  Administered 2018-09-07: 5 [IU] via SUBCUTANEOUS
  Administered 2018-09-07: 3 [IU] via SUBCUTANEOUS
  Administered 2018-09-07: 8 [IU] via SUBCUTANEOUS
  Administered 2018-09-07 (×2): 3 [IU] via SUBCUTANEOUS
  Administered 2018-09-07 (×2): 8 [IU] via SUBCUTANEOUS
  Administered 2018-09-08 (×2): 3 [IU] via SUBCUTANEOUS
  Administered 2018-09-08: 8 [IU] via SUBCUTANEOUS
  Administered 2018-09-08: 2 [IU] via SUBCUTANEOUS
  Administered 2018-09-08: 5 [IU] via SUBCUTANEOUS
  Administered 2018-09-09: 8 [IU] via SUBCUTANEOUS
  Administered 2018-09-09: 5 [IU] via SUBCUTANEOUS
  Administered 2018-09-09: 2 [IU] via SUBCUTANEOUS
  Administered 2018-09-09: 3 [IU] via SUBCUTANEOUS
  Administered 2018-09-09: 8 [IU] via SUBCUTANEOUS
  Administered 2018-09-09 – 2018-09-10 (×2): 3 [IU] via SUBCUTANEOUS
  Administered 2018-09-10: 8 [IU] via SUBCUTANEOUS
  Administered 2018-09-10: 3 [IU] via SUBCUTANEOUS

## 2018-09-05 NOTE — Care Management (Signed)
[id number] [employer] [phone]

## 2018-09-05 NOTE — Progress Notes (Signed)
Palliative care progress note  Patient ID: Allen Glenn, male   DOB: 07-28-1956, 62 y.o.   MRN: [id number]  Chart reviewed, including pertinent labs and imaging.  I saw and examined Allen Glenn.  Patient appears comfortable on BiPap and denies any needs or complaints.  He remains awake and talkative today.  Patient continues to be BiPap dependant, but nurse reports brief periods where he has been able to remove for sips for his comfort. He is able to communicate with family.   Met with patient, his wife, and his children.  Emotional support offered.    Plan to continue Bipap, and Allen Glenn appears to have some improvement again today.  If it appears there becomes a point where his comfort is being compromised, will need further discussion.   Plan to continue current interventions and continue to reassess his situation.    Questions and concerns addressed     Total time: 20  minutes  Greater than 50% of the time was spent in counseling and coordination of care  Micheline Rough, MD [facility] [phone]

## 2018-09-05 NOTE — Progress Notes (Signed)
Pharmacy Antibiotic Note  Allen Glenn is a 62 y.o. male with metastatic colon cancer undergoing chemotherapy treatment, presented to the ED on with sepsis. ID currently seeing patient.  He's on vancomycin, levaquin, diflucan and bactrim fpr broad coverage.  Today, 09/05/2018: - day #9 vancomycin, day #8 levaquin, day# 5 diflucan, day #4 bactrim  - afeb, wbc wnl, scr stable at 0.41 - all cultures have been negative thus far - ID to continue abx through the weekend  Plan: fluconazole 400 mg q24h levaquin 750 mg IV q24h bactrim 420 mg IV q8h, change vancomycin to 1 gm IV q12 based on today's peak and trough  __________________________________ Height: 6\' 2"  (188 cm) Weight: 185 lb 10 oz (84.2 kg) IBW/kg (Calculated) : 82.2  Temp (24hrs), Avg:97.6 F (36.4 C), Min:96.4 F (35.8 C), Max:98.2 F (36.8 C)  Recent Labs  Lab 09/01/18 0555 09/02/18 0615 09/03/18 0336 09/04/18 0323 09/05/18 0440 09/05/18 0926 09/05/18 1300  WBC 3.0* 5.8 6.0 6.5 7.9  --   --   CREATININE 0.37* 0.37* 0.40* 0.36* 0.41*  --   --   VANCOTROUGH  --   --   --   --   --  15  --   VANCOPEAK  --   --   --   --   --   --  37    Estimated Creatinine Clearance: 112.7 mL/min (A) (by C-G formula based on SCr of 0.41 mg/dL (L)).    No Known Allergies   Antimicrobials this admission: 9/5 Vancomycin >> 9/7, resumed 9/8 >> 9/5 Zosyn >> 9/8 9/6 Levofloxacin >>  9/8 Meropenem >> 9/9 9/9 Diflucan >> 9/9 Bactrim >>   Dose adjustments this admission: 9/13 VT at 0926=15         Vpk at 1300 = 37 Ke 0.1062, t/12 6.5 hr, Cmax 43.4, Cmin 14.4. Vd 37 L, CL 65.4. Change to Vanc 1 gm q12 for AUC 509, Cmax 35.6 Cmin 11.1   Microbiology results: 9/4 BCx (at [facility]): neg FINAL 9/5 MRSA PCR: negative  9/5 BCx x2: neg FINAL 9/6 Urine legionella Ag: negative 9/6 Urine strep pneumo Ag: neg 9/9 Respiratory virus panel: negative  Thank you for allowing pharmacy to be a part of this patient's care.  Eudelia Bunch,  Pharm.D [phone] 09/05/2018 1:48 PM

## 2018-09-05 NOTE — Progress Notes (Signed)
PROGRESS NOTE    Allen Glenn  EPP:[id number] DOB: 1956-09-03 DOA: 08/28/2018 PCP: [name], MD  Brief Narrative:  Allen Glenn is a 62 year old male with past medical history of diabetes, CAD, hypertension and metastatic colon cancer on chemotherapy 8/27 who presented to the emergency department complaining of fever, fatigue and tachycardia.  Upon ED evaluation patient was found to be hypoxic, elevated lactic acid and febrile.  Patient was admitted with working diagnosis of sepsis of unknown origin and started on empiric antibiotics. Found to have severe bilateral pneumonia. Decompensated started on BiPAP, have developed a picture of ARDS however patient DNR/DNI. ID and PCCM consulted. He Continues to be on BiPAP but FIO2 weaned today to 80%.   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)   Fever   Sepsis ([place])   Chemotherapy-induced neutropenia ([place])   Acute respiratory failure with hypoxia (HCC)   Electrolyte imbalance   Atherosclerosis of aorta (HCC)   Hypoxia  Sepsis due to Pneumonia -Bilateral PNA, no changes in status but feels slightly better but weak.  -CT chest performed 9/7 shows extensive opacities throughout both lungs. Small bilateral pleural effusion and bilateral lung nodule compatible with metastatic disease.   -CXR -Some improvement in oxygenation overnight,  -will continue Lasix daily 40 mg, monitor renal function closely.   -Continue BiPAP if oxygenation remains stable can start titrating FiO2.   -Continue  current antibiotic therapy with Bactrim, vancomycin, Levaquin and Diflucan.   -Continue Solu-Medrol 60 mg every 6 hours for possible pneumonitis and start taper if able.  -Continue supportive treatment with Ativan and morphine for  -ID following.  Neutropenia improved. -Palliative Care for [place]  Acute Respiratory Failure with Hypoxia -Multifactorial due to multifocal pneumonia, fluid overload and now with poss ARDS.   -Patient and family decided on DNR/DNI.   -Patient remains dependent on BiPAP, will try to wean FiO2 if oxygenation  -remained above 95% earlier but weaned to 80%, if does not respond he might need to opt for comfort care measures.   -Palliative care recommendations appreciated and continuing to Monitor -Day 3 on Continuous BiPAP today   Thrombocytopenia - stable  -In setting of chemotherapy and sepsis.  -No signs of overt bleeding -Platelet Count is 39  Stage IV colon cancer with metastasis to lungs, bone and brain -Being follow by Dr Benay Spice. Management per Oncology  -[name] feel that clinical presentation is less likely related to Tumor Progress in the Lungs or Drug-Induced Pneumonitis   HTN -BP remains stable, continue lisinopril.  Sinus Tachycardia  -Tachycardia has improved some but likely related to Respiratory issues; felt to be related to sepsis and respiratory failure.   -Echo with no abnormalities.  IV metoprolol as needed for heart rate above 130 bpm's.  Type 2 DM   -Last A1c 7.9.  -CBGs now elevated in setting of high-dose steroids. -Patient n.p.o. will continue sliding scale for now but changed to Novolog 0-15 units q4H and increase lantus to 33 units qHS -Continue to  monitor CBGs, suspect to improve with tapering steroids. -CBG's ranging from 197-356  Goals of cares DNR/DNI, continue to monitor over the next 24 hours and take it day by Day. Slightly improved   DVT prophylaxis: SCDs Code Status: DO NOT RESUSCITATE Family Communication:  Discussed with Wife and Daughter Disposition Plan: Pending  Consultants:   Oncology  ID  Palliative Care   Procedures: None   Antimicrobials:  Anti-infectives (From admission, onward)   Start     Dose/Rate Route Frequency Ordered Stop   09/05/18 2200  vancomycin (VANCOCIN) IVPB 1000 mg/200 mL premix     1,000 mg 200 mL/hr over 60 Minutes Intravenous Every 12 hours 09/05/18 1353     09/01/18 1800  sulfamethoxazole-trimethoprim (BACTRIM) 420 mg in dextrose 5 % 500 mL IVPB     420 mg 350.8 mL/hr over 90 Minutes Intravenous Every 8 hours 09/01/18 1529     09/01/18 1000  fluconazole (DIFLUCAN) IVPB 400 mg     400 mg 100 mL/hr over 120 Minutes Intravenous Every 24 hours 09/01/18 0838     08/31/18 2200  vancomycin (VANCOCIN) 1,250 mg in sodium chloride 0.9 % 250 mL IVPB  Status:  Discontinued     1,250 mg 166.7 mL/hr over 90 Minutes Intravenous Every 12 hours 08/31/18 0852 09/05/18 1353   08/31/18 1000  meropenem (MERREM) 1 g in sodium chloride 0.9 % 100 mL IVPB  Status:  Discontinued     1 g 200 mL/hr over 30 Minutes Intravenous Every 8 hours 08/31/18 0848 09/01/18 1546   08/31/18 1000  vancomycin (VANCOCIN) 1,750 mg in sodium chloride 0.9 % 500 mL IVPB     1,750 mg 250 mL/hr over 120 Minutes Intravenous  Once 08/31/18 0852 08/31/18 1146   08/29/18 1400  levofloxacin (LEVAQUIN) IVPB 750 mg     750 mg 100 mL/hr over 90 Minutes Intravenous Every 24 hours 08/29/18 1353     08/28/18 2000  vancomycin (VANCOCIN) 1,250 mg in sodium chloride 0.9 % 250 mL IVPB  Status:  Discontinued     1,250 mg 166.7 mL/hr over 90 Minutes Intravenous Every 12 hours 08/28/18 1725 08/30/18 0853   08/28/18 1500  piperacillin-tazobactam (ZOSYN) IVPB 3.375 g  Status:  Discontinued     3.375 g 12.5 mL/hr over 240 Minutes Intravenous Every 8 hours 08/28/18 1245 08/31/18 0844   08/28/18 0900  vancomycin (VANCOCIN) 1,500 mg in sodium chloride 0.9 % 500 mL IVPB     1,500 mg 250 mL/hr over 120 Minutes  Intravenous  Once 08/28/18 0854 08/28/18 1218   08/28/18 0900  piperacillin-tazobactam (ZOSYN) IVPB 3.375 g     3.375 g 100 mL/hr over 30 Minutes Intravenous  Once 08/28/18 0854 08/28/18 0935     Subjective: Examined and states he is feeling slightly better but still very weak and fatigued.  No chest pain, lightheadedness but still has mild shortness of breath.  Wanting to come off BiPAP and wanted to eat.  No other concerns or complaints at this time  Objective: Vitals:   09/05/18 1600 09/05/18 1808 09/05/18 1940 09/05/18 1956  BP:  110/68 110/68   Pulse: (!) 106 (!) 109 (!) 108   Resp: (!) 35 (!) 25 (!) 37   Temp: (!) 97.4 F (36.3 C)   (!) 97.4 F (36.3 C)  TempSrc: Axillary   Axillary  SpO2: 93% 93% 94%   Weight:      Height:        Intake/Output Summary (Last 24 hours) at 09/05/2018 1959  Last data filed at 09/05/2018 1805 Gross per 24 hour  Intake 3221.09 ml  Output 2425 ml  Net 796.09 ml   Filed Weights   08/28/18 1400  Weight: 84.2 kg   Examination: Physical Exam:  Constitutional: WN/WD obese Caucasian Male who appears fatigued but is calm; Appears comfortable Eyes: Lids and conjunctivae normal, sclerae anicteric  ENMT: External Ears, Nose appear normal. Grossly normal hearing.  Neck: Appears normal, supple, no cervical masses, normal ROM, no appreciable thyromegaly; no JVD Respiratory: Diminished to auscultation bilaterally, no wheezing, rhonchi or crackles but has bibasilar rales. Normal respiratory effort and patient is not tachypenic. No accessory muscle use.  Cardiovascular: Tachycardic rate, no murmurs / rubs / gallops. S1 and S2 auscultated.  Abdomen: Soft, non-tender, non-distended. No masses palpated. No appreciable hepatosplenomegaly. Bowel sounds positive x4.  GU: Deferred. Musculoskeletal: No clubbing / cyanosis of digits/nails. Normal strength and muscle tone.  Skin: No rashes, lesions, ulcers on a limited. No induration; Warm and dry.  Neurologic: CN  2-12 grossly intact with no focal deficits.Romberg sign and cerebellar reflexes not assessed.  Psychiatric: Normal judgment and insight. Alert and oriented x 3. Normal mood and appropriate affect.   Data Reviewed: I have personally reviewed following labs and imaging studies  CBC: Recent Labs  Lab 09/01/18 0555 09/02/18 0615 09/03/18 0336 09/04/18 0323 09/05/18 0440  WBC 3.0* 5.8 6.0 6.5 7.9  NEUTROABS 2.6 5.4 5.6 6.0 7.4  HGB 8.7* 8.4* 7.7* 7.0* 7.7*  HCT 26.3* 25.1* 23.5* 21.5* 23.2*  MCV 91.6 91.3 91.8 91.1 91.3  PLT 93* 100* 93* 80* 78*   Basic Metabolic Panel: Recent Labs  Lab 08/31/18 0530 09/01/18 0555 09/02/18 0615 09/03/18 0336 09/04/18 0323 09/05/18 0440  NA 133* 136 131* 130* 128* 131*  K 4.0 3.9 4.2 4.6 4.7 4.8  CL 98 101 95* 94* 94* 96*  CO2 24 25 24 24 24 22   GLUCOSE 131* 96 146* 294* 289* 271*  BUN 16 17 15 14 15 16   CREATININE 0.33* 0.37* 0.37* 0.40* 0.36* 0.41*  CALCIUM 7.9* 7.9* 7.9* 7.9* 7.8* 7.8*  MG 2.0 1.9 2.4 2.6* 2.5*  --    GFR: Estimated Creatinine Clearance: 112.7 mL/min (A) (by C-G formula based on SCr of 0.41 mg/dL (L)). Liver Function Tests: Recent Labs  Lab 08/30/18 0630 08/31/18 0530  AST  --  29  ALT  --  44  ALKPHOS  --  249*  BILITOT  --  0.4  PROT  --  5.3*  ALBUMIN 1.9* 1.9*   No results for input(s): LIPASE, AMYLASE in the last 168 hours. No results for input(s): AMMONIA in the last 168 hours. Coagulation Profile: No results for input(s): INR, PROTIME in the last 168 hours. Cardiac Enzymes: No results for input(s): CKTOTAL, CKMB, CKMBINDEX, TROPONINI in the last 168 hours. BNP (last 3 results) No results for input(s): PROBNP in the last 8760 hours. HbA1C: No results for input(s): HGBA1C in the last 72 hours. CBG: Recent Labs  Lab 09/04/18 1554 09/04/18 2152 09/05/18 0755 09/05/18 1208 09/05/18 1556  GLUCAP 234* 197* 273* 260* 356*   Lipid Profile: No results for input(s): CHOL, HDL, LDLCALC, TRIG, CHOLHDL,  LDLDIRECT in the last 72 hours. Thyroid Function Tests: No results for input(s): TSH, T4TOTAL, FREET4, T3FREE, THYROIDAB in the last 72 hours. Anemia Panel: No results for input(s): VITAMINB12, FOLATE, FERRITIN, TIBC, IRON, RETICCTPCT in the last 72 hours. Sepsis Labs: Recent Labs  Lab 08/31/18 0530 09/01/18 0555  PROCALCITON 3.20 2.28  Recent Results (from the past 240 hour(s))  Culture, Blood     Status: None   Collection Time: 08/27/18  9:51 AM  Result Value Ref Range Status   Specimen Description BLOOD BLOOD RIGHT HAND PORTA CATH  Final   Special Requests   Final    BOTTLES DRAWN AEROBIC AND ANAEROBIC Blood Culture adequate volume   Culture   Final    NO GROWTH 5 DAYS Performed at [facility], [address] [place], [place] [address]    Report Status 09/01/2018 FINAL  Final  Culture, Blood     Status: None   Collection Time: 08/27/18 10:10 AM  Result Value Ref Range Status   Specimen Description   Final    BLOOD RIGHT HAND Performed at [facility], [place] [address], [place], [place] [address]    Special Requests   Final    NONE Performed at [facility], [place] [address], [place], [place] [address]    Culture   Final    NO GROWTH 5 DAYS Performed at [facility], [place] [address], [place], [place] [address]    Report Status 09/01/2018 FINAL  Final  MRSA PCR Screening     Status: None   Collection Time: 08/28/18  1:45 PM  Result Value Ref Range Status   MRSA by PCR NEGATIVE NEGATIVE Final    Comment:        The GeneXpert MRSA Assay (FDA approved for NASAL specimens only), is one component of a comprehensive MRSA colonization surveillance program. It is not intended to diagnose MRSA infection nor to guide or monitor treatment for MRSA infections. Performed at [facility], [place] [address] [place], [place] [address]   Culture, blood (x 2)     Status: None   Collection Time:  08/28/18  7:09 PM  Result Value Ref Range Status   Specimen Description   Final    BLOOD LEFT HAND Performed at [facility] [address] [place], [place] [address]    Special Requests   Final    BOTTLES DRAWN AEROBIC AND ANAEROBIC Blood Culture adequate volume Performed at [facility] [address], [place], [place] [address]    Culture   Final    NO GROWTH 5 DAYS Performed at [facility], [place] [address] [place], [place] [address]    Report Status 09/02/2018 FINAL  Final  Culture, blood (x 2)     Status: None   Collection Time: 08/28/18  7:15 PM  Result Value Ref Range Status   Specimen Description   Final    BLOOD RIGHT HAND Performed at [facility] [address] [place], [place] [address]    Special Requests   Final    BOTTLES DRAWN AEROBIC AND ANAEROBIC Blood Culture adequate volume Performed at [facility] [address], [place], [place] [address]    Culture   Final    NO GROWTH 5 DAYS Performed at [facility], [place] [address], [place], [place] [address]    Report Status 09/02/2018 FINAL  Final  Respiratory Panel by PCR     Status: None   Collection Time: 09/01/18 11:46 AM  Result Value Ref Range Status   Adenovirus NOT DETECTED NOT DETECTED Final   Coronavirus 229E NOT DETECTED NOT DETECTED Final   Coronavirus HKU1 NOT DETECTED NOT DETECTED Final   Coronavirus NL63 NOT DETECTED NOT DETECTED  Final   Coronavirus OC43 NOT DETECTED NOT DETECTED Final   Metapneumovirus NOT DETECTED NOT DETECTED Final   Rhinovirus / Enterovirus NOT DETECTED NOT DETECTED Final   Influenza A NOT DETECTED NOT DETECTED Final   Influenza B NOT DETECTED NOT DETECTED Final   Parainfluenza Virus 1 NOT DETECTED NOT DETECTED Final   Parainfluenza Virus 2 NOT DETECTED NOT DETECTED Final   Parainfluenza Virus 3 NOT DETECTED NOT DETECTED Final   Parainfluenza Virus 4 NOT DETECTED NOT DETECTED Final    Respiratory Syncytial Virus NOT DETECTED NOT DETECTED Final   Bordetella pertussis NOT DETECTED NOT DETECTED Final   Chlamydophila pneumoniae NOT DETECTED NOT DETECTED Final   Mycoplasma pneumoniae NOT DETECTED NOT DETECTED Final    Comment: Performed at [facility], [place] [address], [place], [place] [address]    Radiology Studies: Dg Chest Port 1 View  Result Date: 09/05/2018 CLINICAL DATA:  Shortness of Breath EXAM: PORTABLE CHEST 1 VIEW COMPARISON:  09/04/2018 FINDINGS: Right Port-A-Cath remains in place, unchanged. Bilateral airspace opacities slightly improved since prior study, particularly on the left. Continued patchy opacities in the mid and right lower lung and left lower lung. Mild cardiomegaly. No effusions or acute bony abnormality. IMPRESSION: Patchy mid and lower lung bilateral airspace opacities. Overall, airspace disease slightly improved since prior study. Electronically Signed   By: Rolm Baptise M.D.   On: 09/05/2018 09:24   Dg Chest Port 1 View  Result Date: 09/04/2018 CLINICAL DATA:  Hypoxia EXAM: PORTABLE CHEST 1 VIEW COMPARISON:  09/03/2018 FINDINGS: Right Port-A-Cath remains in place, unchanged. Stable diffuse bilateral airspace disease. Heart is borderline in size. No visible significant effusions or pneumothorax. IMPRESSION: Stable diffuse bilateral airspace disease. Electronically Signed   By: Rolm Baptise M.D.   On: 09/04/2018 07:58   Scheduled Meds: . atorvastatin  10 mg Oral q1800  . chlorhexidine  15 mL Mouth Rinse BID  . Chlorhexidine Gluconate Cloth  6 each Topical Daily  . furosemide  40 mg Intravenous Daily  . gabapentin  300 mg Oral TID  . insulin aspart  0-5 Units Subcutaneous QHS  . insulin aspart  0-9 Units Subcutaneous TID WC  . insulin aspart  5 Units Subcutaneous TID WC  . insulin glargine  30 Units Subcutaneous QHS  . lisinopril  2.5 mg Oral Daily  . loratadine  10 mg Oral Daily  . LORazepam  0.5 mg Intravenous 2 times per day  .  LORazepam  1 mg Intravenous QHS  . mouth rinse  15 mL Mouth Rinse q12n4p  . methylPREDNISolone (SOLU-MEDROL) injection  60 mg Intravenous Q6H  . morphine  15 mg Oral Daily  . multivitamin with minerals  1 tablet Oral Daily  . nystatin  5 mL Oral QID  . pantoprazole  40 mg Oral Daily  . polyvinyl alcohol  1 drop Both Eyes TID  . protein supplement shake  11 oz Oral TID BM  . sodium chloride flush  3 mL Intravenous Q12H  . tamsulosin  0.4 mg Oral QPC breakfast   Continuous Infusions: . sodium chloride Stopped (08/30/18 0808)  . sodium chloride Stopped (09/05/18 1223)  . fluconazole (DIFLUCAN) IV Stopped (09/05/18 1215)  . levofloxacin (LEVAQUIN) IV Stopped (09/05/18 1617)  . sulfamethoxazole-trimethoprim Stopped (09/05/18 1402)  . vancomycin      LOS: 8 days   Kerney Elbe, DO [facility] PAGER is on [place]  If 7PM-7AM, please contact night-coverage [email or web address] Password [facility] 09/05/2018, 7:59 PM

## 2018-09-05 NOTE — Progress Notes (Addendum)
Patient ID: Allen Glenn, male   DOB: 1956-09-04, 62 y.o.   MRN: [id number]         [facility]  Date of Admission:  08/28/2018    Day 9 vancomycin        Day 8 levofloxacin        Day 5 fluconazole        Day 4 trimethoprim sulfamethoxazole ASSESSMENT: He remains on very broad empiric therapy for presumed pneumonia.  He became afebrile on steroids.  His FiO2 has been weaned somewhat overnight and his chest x-ray is showing some clearing of his bilateral infiltrates.  I recommend continuing current antibiotic therapy through the weekend.  PLAN: 1. Continue current, empiric antibiotic therapy  2. Please call Dr. 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 atorvastatin  10 mg Oral q1800  . chlorhexidine  15 mL Mouth Rinse BID  . Chlorhexidine Gluconate Cloth  6 each Topical Daily  . furosemide  40 mg Intravenous Daily  . gabapentin  300 mg Oral TID  . insulin aspart  0-5 Units Subcutaneous QHS  . insulin aspart  0-9 Units Subcutaneous TID WC  . insulin aspart  5 Units Subcutaneous TID WC  . insulin glargine  30 Units Subcutaneous QHS  . lisinopril  2.5 mg Oral Daily  . loratadine  10 mg Oral Daily  . LORazepam  0.5 mg Intravenous 2 times per day    . LORazepam  1 mg Intravenous QHS  . mouth rinse  15 mL Mouth Rinse q12n4p  . methylPREDNISolone (SOLU-MEDROL) injection  60 mg Intravenous Q6H  . morphine  15 mg Oral Daily  . multivitamin with minerals  1 tablet Oral Daily  . nystatin  5 mL Oral QID  . pantoprazole  40 mg Oral Daily  . polyvinyl alcohol  1 drop Both Eyes TID  . protein supplement shake  11 oz Oral TID BM  . sodium chloride flush  3 mL Intravenous Q12H  . tamsulosin  0.4 mg Oral QPC breakfast   Continuous Infusions: . sodium chloride Stopped (08/30/18 0808)  . sodium chloride 10 mL/hr at 09/05/18 0900  . fluconazole (DIFLUCAN) IV 400 mg (09/05/18 1010)  . levofloxacin (LEVAQUIN) IV Stopped (09/04/18 1559)  . sulfamethoxazole-trimethoprim Stopped (09/05/18 0250)  . vancomycin 1,250 mg (09/05/18 1008)   PRN Meds:.sodium chloride, sodium chloride, acetaminophen **OR** acetaminophen, diphenoxylate-atropine, hydrALAZINE, levalbuterol, magic mouthwash, metoCLOPramide, metoprolol tartrate, morphine injection, nitroGLYCERIN, ondansetron **OR** ondansetron (ZOFRAN) IV, polyethylene glycol, sodium chloride flush, sodium chloride flush, traZODone  Review of Systems: Review of Systems  Unable to perform ROS: Critical illness    No Known Allergies  OBJECTIVE: Vitals:   09/05/18 0353 09/05/18 0400 09/05/18 0800 09/05/18 1014  BP:  90/66 (!) 107/55 117/64  Pulse:  93 97   Resp:  (!) 32 (!) 27   Temp: 97.6 F (36.4 C)  98.2 F (36.8 C)   TempSrc: Axillary  Axillary   SpO2:  91% 92%   Weight:      Height:       Body mass index is 23.83 kg/m.  Physical Exam  Constitutional:  He is resting comfortably in bed.  No family are present.  Cardiovascular: Regular rhythm and normal heart sounds.  He is tachycardic.  Pulmonary/Chest:  BiPAP mask is in place.  He remains tachypneic.  His lungs are clear anteriorly.    Lab Results Lab Results  Component Value Date   WBC 7.9 09/05/2018   HGB 7.7 (L) 09/05/2018   HCT  23.2 (L) 09/05/2018   MCV 91.3 09/05/2018   PLT 78 (L) 09/05/2018    Lab Results  Component Value Date   CREATININE 0.41 (L) 09/05/2018   BUN 16 09/05/2018   NA 131 (L) 09/05/2018   K 4.8 09/05/2018   CL 96 (L) 09/05/2018   CO2 22 09/05/2018    Lab Results  Component Value Date   ALT 44 08/31/2018   AST 29 08/31/2018   ALKPHOS 249 (H) 08/31/2018   BILITOT 0.4 08/31/2018     Microbiology: Recent Results (from the past 240 hour(s))  Culture, Blood     Status: None   Collection Time: 08/27/18  9:51 AM  Result Value Ref Range Status   Specimen Description BLOOD BLOOD RIGHT HAND PORTA CATH  Final   Special Requests   Final    BOTTLES DRAWN AEROBIC AND ANAEROBIC Blood Culture adequate volume   Culture   Final    NO GROWTH 5 DAYS Performed at [facility], [place] [address] [place], [place] [address]    Report Status 09/01/2018 FINAL  Final  Culture, Blood     Status: None   Collection Time: 08/27/18 10:10 AM  Result Value Ref Range Status   Specimen Description   Final    BLOOD RIGHT HAND Performed at [facility], [place] [address], [place], [place] [address]    Special Requests   Final    NONE Performed at [facility], [place] [address] [place], [place] [address]    Culture   Final    NO GROWTH 5 DAYS Performed at [facility], [place] [address], [place], [place] [address]    Report Status 09/01/2018 FINAL  Final  MRSA PCR Screening     Status: None   Collection Time: 08/28/18  1:45 PM  Result Value Ref Range Status   MRSA by PCR NEGATIVE NEGATIVE Final    Comment:        The GeneXpert MRSA Assay (FDA approved for NASAL specimens only), is one component of a comprehensive MRSA colonization surveillance program. It is not intended to diagnose MRSA infection nor to guide or monitor treatment for MRSA infections. Performed at [facility], [place] [address], [place], [place] [address]     Culture, blood (x 2)     Status: None   Collection Time: 08/28/18  7:09 PM  Result Value Ref Range Status   Specimen Description   Final    BLOOD LEFT HAND Performed at [place] [address], [place], [place] [address]    Special Requests   Final    BOTTLES DRAWN AEROBIC AND ANAEROBIC Blood Culture adequate volume  Performed at [facility], [place] [address], [place], [place] [address]    Culture   Final    NO GROWTH 5 DAYS Performed at [facility], [place] [address] [place], [place] [address]    Report Status 09/02/2018 FINAL  Final  Culture, blood (x 2)     Status: None   Collection Time: 08/28/18  7:15 PM  Result Value Ref Range Status   Specimen Description   Final    BLOOD RIGHT HAND Performed at [place] [address] [place], [place] [address]    Special Requests   Final    BOTTLES DRAWN AEROBIC AND ANAEROBIC Blood Culture adequate volume Performed at [place] [address] [place], [place] [address]    Culture   Final    NO GROWTH 5 DAYS Performed at [facility], [place] [address], [place], [place] [address]    Report Status 09/02/2018 FINAL  Final  Respiratory Panel by PCR     Status: None   Collection Time: 09/01/18 11:46 AM  Result Value Ref Range Status   Adenovirus NOT DETECTED NOT DETECTED Final   Coronavirus 229E NOT DETECTED NOT DETECTED Final   Coronavirus HKU1 NOT DETECTED NOT DETECTED Final   Coronavirus NL63 NOT DETECTED NOT DETECTED Final   Coronavirus OC43 NOT DETECTED NOT DETECTED Final   Metapneumovirus NOT DETECTED NOT DETECTED Final   Rhinovirus / Enterovirus NOT DETECTED NOT DETECTED Final   Influenza A NOT DETECTED NOT DETECTED Final   Influenza B NOT DETECTED NOT DETECTED Final   Parainfluenza Virus 1 NOT DETECTED NOT DETECTED Final   Parainfluenza Virus 2 NOT DETECTED NOT DETECTED Final   Parainfluenza Virus 3 NOT DETECTED NOT DETECTED  Final   Parainfluenza Virus 4 NOT DETECTED NOT DETECTED Final   Respiratory Syncytial Virus NOT DETECTED NOT DETECTED Final   Bordetella pertussis NOT DETECTED NOT DETECTED Final   Chlamydophila pneumoniae NOT DETECTED NOT DETECTED Final   Mycoplasma pneumoniae NOT DETECTED NOT DETECTED Final    Comment: Performed at [facility], [place] [address] [place], [place] [address]    Michel Bickers, [facility] [phone] pager   [phone] cell 09/05/2018, 11:38 AM

## 2018-09-05 NOTE — Progress Notes (Signed)
IP PROGRESS NOTE  Subjective:   Allen Glenn mains on BiPAP.  He is alert.  His wife and daughter are at the bedside.  Objective: Vital signs in last 24 hours: Blood pressure 90/66, pulse 93, temperature 97.6 F (36.4 C), temperature source Axillary, resp. rate (!) 32, height 6' 2"  (1.88 m), weight 185 lb 10 oz (84.2 kg), SpO2 91 %.  Intake/Output from previous day: 09/12 0701 - 09/13 0700 In: 2610.1 [P.O.:480; I.V.:71.2; IV Piggyback:2059] Out: 4400 [Urine:4400]  Physical Exam:  HEENT: BIPAP mask in place Lungs: Lungs clear anteriorly, increased respiratory rate Cardiac: Regular rate and rhythm Abdomen: Nontender, no hepatosplenomegaly Extremities: No leg edema Neurologic: Alert, follows commands  Portacath/PICC-without erythema  Lab Results: Recent Labs    09/04/18 0323 09/05/18 0440  WBC 6.5 7.9  HGB 7.0* 7.7*  HCT 21.5* 23.2*  PLT 80* 78*   ANC 7.4 BMET Recent Labs    09/04/18 0323 09/05/18 0440  NA 128* 131*  K 4.7 4.8  CL 94* 96*  CO2 24 22  GLUCOSE 289* 271*  BUN 15 16  CREATININE 0.36* 0.41*  CALCIUM 7.8* 7.8*    Lab Results  Component Value Date   CEA1 71.41 (H) 08/19/2018    Studies/Results: Dg Chest Port 1 View  Result Date: 09/04/2018 CLINICAL DATA:  Hypoxia EXAM: PORTABLE CHEST 1 VIEW COMPARISON:  09/03/2018 FINDINGS: Right Port-A-Cath remains in place, unchanged. Stable diffuse bilateral airspace disease. Heart is borderline in size. No visible significant effusions or pneumothorax. IMPRESSION: Stable diffuse bilateral airspace disease. Electronically Signed   By: Rolm Baptise M.D.   On: 09/04/2018 07:58   Dg Chest Port 1 View  Result Date: 09/03/2018 CLINICAL DATA:  Hypoxia EXAM: PORTABLE CHEST 1 VIEW COMPARISON:  09/02/2018 FINDINGS: Diffuse bilateral airspace disease has worsened since prior study. Borderline cardiomegaly. No visible effusions. No acute bony abnormality. Right Port-A-Cath remains in place, unchanged. IMPRESSION: Worsening  diffuse bilateral airspace disease which could reflect edema or infection. Electronically Signed   By: Rolm Baptise M.D.   On: 09/03/2018 11:29    Medications: I have reviewed the patient's current medications.  Assessment/Plan:  1. 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 No BRAFmutation  Liver biopsy 05/04/2015-metastatic adenocarcinoma consistent with a colon primary ? Staging PET scan 06/08/2015-isolated segment 4A liver lesion ? Initiation of adjuvant FOLFOX 06/13/2015 ? Restaging CT 08/09/2015 revealed a slight decrease in a borderline ileocolic node, decrease in the hepatic dome metastasis, no new lesions ? Liver resection 09/28/2015-pathology consistent with metastatic colon cancer, negative margins ? Adjuvant FOLFOX resumed 11/08/2015, oxaliplatin eliminated beginning 11/22/2015 secondary to neuropathy. He completed adjuvant chemotherapy 02/16/2016 ? Restaging chest CT 11/08/2016, compared to 05/07/2016 revealed a new 9 mm left upper lobe nodule, stable 2.2 cm right hepatic lesion ? PET scan 11/19/2016 confirmed a hypermetabolic left upper lobe nodule, hypermetabolic left paratracheal and pericardiac lymph nodes, and hypermetabolism associated with the hypoattenuating lesion in the dome of the liver ? Status post EBUSbiopsies of the left lingula nodule and a level 4Lnode on 11/28/2016-no evidence of malignancy ? CT chest 02/11/2017-increase in size of the left pulmonary nodule and epicardial lymph node ? Status post SBRT 2 the left lung nodule and mediastinum completed 03/14/2017 ? CTs 07/01/2017-new 9 mm focus along the right liver capsule, stable left subcapsular liver lesion, radiation change at site of left upper lobe nodule, stable pericardiallymph node ? CTs 11/25/2017-new 5 mm lingular  nodule, enlargement of capsular-based right  liver lesion, new capsular based right liver lesion capsular lesion at the hepatic dome ? CTs 02/25/2018-enlargement of small lung nodules, liver lesions and small mesenteric lymph node ? CT abdomen/pelvis 05/15/2018-new lower lobe pulmonary nodules, enlargement of liver lesions and right lower quadrant soft tissue nodules ? 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 Brain MRI 05/26/2018-3.5 similar left posterior fossa mass, 4 additional subcentimeter enhancing brain lesions-3 cerebellar, 1 left thalamic ? SRS to all 5 brain lesions 06/02/2018 ? Surgical excision of left cerebellar lesion 06/04/2018, adenocarcinoma consistent with colorectal primary ? MRI lumbar spine 06/18/2018-extensive metastases to the lumbar spine, sacrum, T12, and pelvis, pathologic fracture of L3 ? Initiation of radiation from T12-S2 06/30/2018, completed 07/11/2018 ? Cycle 1 FOLFIRI 08/19/2018 2. Coronary artery disease status post a myocardial infarction in 2014  3. Hypertension  4. Hyperlipidemia  5. Diabetes  6. Admission 05/20/2018 with nausea/vomitingsecondary to a cerebellar metastasis  7.Oral candidiasis 06/11/2018-started on Mycelex troches  8. 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  Admission 08/28/2018 with a febrile illness, likely pneumonia  10.  Anemia/thrombocytopenia secondary to chemotherapy and infection  11.  Respiratory failure secondary to pneumonia,?  ARDS-maintained on steroids and broad-spectrum antibiotics   Allen Glenn remains on BiPAP for treatment of respiratory failure.  There is extensive bilateral airspace disease on imaging consistent with pneumonia.  He may have ARDS.  I think the clinical presentation is less likely related to tumor progression in the lungs or drug-induced pneumonitis.  His clinical status appears slightly improved over the past few  days.  The respiratory rate and heart rate are lower.  He is requiring less oxygen support.  Recommendations: 1.  Continue antibiotics as recommended by Dr. Megan Salon 2.  Steroids, BiPAP/oxygen support 3.  Morphine and Ativan as needed for comfort  Please call Oncology as needed over the weekend.  I will check on him 09/08/2018.   LOS: 8 days   Betsy Coder, MD   09/05/2018, 7:57 AM

## 2018-09-05 NOTE — Progress Notes (Signed)
Inpatient Diabetes Program Recommendations  AACE/ADA: New Consensus Statement on Inpatient Glycemic Control (2015)  Target Ranges:  Prepandial:   less than 140 mg/dL      Peak postprandial:   less than 180 mg/dL (1-2 hours)      Critically ill patients:  140 - 180 mg/dL   Lab Results  Component Value Date   GLUCAP 260 (H) 09/05/2018   HGBA1C 7.9 (H) 05/30/2018    Review of Glycemic Control  CBGs 197 - 315 mg/dL. Insulin adjustments, if appropriate.  Inpatient Diabetes Program Recommendations:     Increase Lantus to 33 units QHS D/C Novolog 5 units tidwc Increase Novolog to 0-15 units Q4H   Will continue to follow.  Thank you. Lorenda Peck, RD, LDN, CDE Inpatient Diabetes Coordinator [phone]

## 2018-09-06 ENCOUNTER — Inpatient Hospital Stay (HOSPITAL_COMMUNITY): Payer: 59

## 2018-09-06 LAB — GLUCOSE, CAPILLARY
GLUCOSE-CAPILLARY: 158 mg/dL — AB (ref 70–99)
GLUCOSE-CAPILLARY: 200 mg/dL — AB (ref 70–99)
Glucose-Capillary: 196 mg/dL — ABNORMAL HIGH (ref 70–99)
Glucose-Capillary: 183 mg/dL — ABNORMAL HIGH (ref 70–99)
Glucose-Capillary: 266 mg/dL — ABNORMAL HIGH (ref 70–99)
Glucose-Capillary: 314 mg/dL — ABNORMAL HIGH (ref 70–99)

## 2018-09-06 LAB — CBC WITH DIFFERENTIAL/PLATELET
BAND NEUTROPHILS: 4 %
BASOS ABS: 0 10*3/uL (ref 0.0–0.1)
BASOS PCT: 0 %
EOS ABS: 0 10*3/uL (ref 0.0–0.7)
Eosinophils Relative: 0 %
HCT: 23.6 % — ABNORMAL LOW (ref 39.0–52.0)
Hemoglobin: 7.9 g/dL — ABNORMAL LOW (ref 13.0–17.0)
LYMPHS PCT: 3 %
Lymphs Abs: 0.3 10*3/uL — ABNORMAL LOW (ref 0.7–4.0)
MCH: 30.7 pg (ref 26.0–34.0)
MCHC: 33.5 g/dL (ref 30.0–36.0)
MCV: 91.8 fL (ref 78.0–100.0)
METAMYELOCYTES PCT: 3 %
MONO ABS: 0.3 10*3/uL (ref 0.1–1.0)
MONOS PCT: 3 %
Myelocytes: 4 %
Neutro Abs: 8.6 10*3/uL — ABNORMAL HIGH (ref 1.7–7.7)
Neutrophils Relative %: 83 %
PLATELETS: 71 10*3/uL — AB (ref 150–400)
RBC: 2.57 MIL/uL — ABNORMAL LOW (ref 4.22–5.81)
RDW: 18.7 % — ABNORMAL HIGH (ref 11.5–15.5)
WBC: 9.2 10*3/uL (ref 4.0–10.5)
nRBC: 2 /100 WBC — ABNORMAL HIGH

## 2018-09-06 LAB — COMPREHENSIVE METABOLIC PANEL
ALBUMIN: 1.6 g/dL — AB (ref 3.5–5.0)
ALT: 26 U/L (ref 0–44)
AST: 28 U/L (ref 15–41)
Alkaline Phosphatase: 183 U/L — ABNORMAL HIGH (ref 38–126)
Anion gap: 13 (ref 5–15)
BUN: 20 mg/dL (ref 8–23)
CO2: 22 mmol/L (ref 22–32)
CREATININE: 0.41 mg/dL — AB (ref 0.61–1.24)
Calcium: 7.8 mg/dL — ABNORMAL LOW (ref 8.9–10.3)
Chloride: 93 mmol/L — ABNORMAL LOW (ref 98–111)
GFR calc Af Amer: >60 mL/min (ref >60)
GFR calc non Af Amer: >60 mL/min (ref >60)
GLUCOSE: 231 mg/dL — AB (ref 70–99)
Potassium: 4.1 mmol/L (ref 3.5–5.1)
SODIUM: 128 mmol/L — AB (ref 135–145)
Total Bilirubin: 0.3 mg/dL (ref 0.3–1.2)
Total Protein: 4.9 g/dL — ABNORMAL LOW (ref 6.5–8.1)

## 2018-09-06 LAB — PHOSPHORUS: Phosphorus: 2.8 mg/dL (ref 2.5–4.6)

## 2018-09-06 LAB — MAGNESIUM: Magnesium: 2.4 mg/dL (ref 1.7–2.4)

## 2018-09-06 MED ORDER — PANTOPRAZOLE SODIUM 40 MG PO TBEC
40.0000 mg | DELAYED_RELEASE_TABLET | Freq: Two times a day (BID) | ORAL | Status: DC
  Administered 2018-09-06 – 2018-09-07 (×4): 40 mg via ORAL
  Filled 2018-09-06 (×4): qty 1

## 2018-09-06 NOTE — Progress Notes (Signed)
PROGRESS NOTE    Allen Glenn  YJE:[id number] DOB: [birth year] DOA: 08/28/2018 PCP: [name], MD  Brief Narrative:  Allen Glenn is a 62 year old male with past medical history of diabetes, CAD, hypertension and metastatic colon cancer on chemotherapy 8/27 who presented to the emergency department complaining of fever, fatigue and tachycardia.  Upon ED evaluation patient was found to be hypoxic, elevated lactic acid and febrile.  Patient was admitted with working diagnosis of sepsis of unknown origin and started on empiric antibiotics. Found to have severe bilateral pneumonia. Decompensated started on BiPAP, have developed a picture of ARDS however patient DNR/DNI. ID and PCCM consulted. He Continues to be on Continuous BiPAP but FIO2 weaned to 80% yesterday but has remained on it for the last 24 hours now attempting to wean to 60% FiO2.  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  -CT chest performed 9/7 shows extensive opacities throughout both lungs. Small bilateral pleural effusion and bilateral lung nodule compatible with metastatic disease.   -CXR -Some improvement in oxygenation overnight,  -will continue Lasix daily 40 mg, monitor renal function  closely.   -Continue BiPAP if oxygenation remains stable can start titrating FiO2.   -Continue current antibiotic therapy with Bactrim, vancomycin, Levaquin and Diflucan.   -Continue Solu-Medrol 60 mg every 6 hours for possible pneumonitis and start taper if able tomorrow  -Continue supportive treatment with Ativan and morphine for Pain and breathing  -ID following.  Neutropenia improved. -Palliative Care for [name] and continue to discuss daily  -Not eating significantly and only eating [employer]    Acute Respiratory Failure with Hypoxia -Multifactorial due to multifocal pneumonia, fluid overload and now with poss ARDS.   -Patient and family decided on DNR/DNI.   -Patient remains dependent on BiPAP, will try to wean FiO2 if oxygenation  -Remained above 95% earlier but weaned to 80% yesterday and weaned to 70% earlier and attempting to go to 60%, if does not respond he might need to opt for comfort care measures.   -Palliative care recommendations appreciated and continuing to Monitor -Day 4 on Continuous BiPAP today   Thrombocytopenia - stable  -In setting of chemotherapy and sepsis.  -No signs of overt bleeding -Platelet Count is 71,000  Stage IV colon cancer with metastasis to lungs, bone and brain -Being follow by Dr Benay Spice. Management per Oncology  -[name] feel that clinical presentation is less likely related to Tumor Progress in the Lungs or Drug-Induced Pneumonitis  -Continuing Treatment as above  HTN -BP remains stable but on the lower side, continue lisinopril.  Sinus Tachycardia  -Tachycardia has improved some but likely related to Respiratory issues; felt to be related to sepsis and respiratory failure.   -  Echo with no abnormalities.   -C/w IV metoprolol as needed for heart rate above 130 bpm's.  Type 2 DM   -Last A1c 7.9.  -CBGs now elevated in setting of high-dose steroids. -Patient n.p.o. will continue sliding scale for now but changed to Novolog 0-15  units q4H and increase lantus to 33 units qHS -Continue to monitor CBGs, suspect to improve with tapering steroids. -CBG's ranging from 153-266  Goals of cares DNR/DNI, continue to monitor over the next 24 hours and take it day by Day. Slightly improved Respiratory wise.   DVT prophylaxis: SCDs Code Status: DO NOT RESUSCITATE Family Communication: Discussed with Wife and Daughter Disposition Plan: Pending on clinical course   Consultants:   Oncology  ID  Palliative Care   Procedures: None   Antimicrobials:  Anti-infectives (From admission, onward)   Start     Dose/Rate Route Frequency Ordered Stop   09/05/18 2200  vancomycin (VANCOCIN) IVPB 1000 mg/200 mL premix     1,000 mg 200 mL/hr over 60 Minutes Intravenous Every 12 hours 09/05/18 1353     09/01/18 1800  sulfamethoxazole-trimethoprim (BACTRIM) 420 mg in dextrose 5 % 500 mL IVPB     420 mg 350.8 mL/hr over 90 Minutes Intravenous Every 8 hours 09/01/18 1529     09/01/18 1000  fluconazole (DIFLUCAN) IVPB 400 mg     400 mg 100 mL/hr over 120 Minutes Intravenous Every 24 hours 09/01/18 0838     08/31/18 2200  vancomycin (VANCOCIN) 1,250 mg in sodium chloride 0.9 % 250 mL IVPB  Status:  Discontinued     1,250 mg 166.7 mL/hr over 90 Minutes Intravenous Every 12 hours 08/31/18 0852 09/05/18 1353   08/31/18 1000  meropenem (MERREM) 1 g in sodium chloride 0.9 % 100 mL IVPB  Status:  Discontinued     1 g 200 mL/hr over 30 Minutes Intravenous Every 8 hours 08/31/18 0848 09/01/18 1546   08/31/18 1000  vancomycin (VANCOCIN) 1,750 mg in sodium chloride 0.9 % 500 mL IVPB     1,750 mg 250 mL/hr over 120 Minutes Intravenous  Once 08/31/18 0852 08/31/18 1146   08/29/18 1400  levofloxacin (LEVAQUIN) IVPB 750 mg     750 mg 100 mL/hr over 90 Minutes Intravenous Every 24 hours 08/29/18 1353     08/28/18 2000  vancomycin (VANCOCIN) 1,250 mg in sodium chloride 0.9 % 250 mL IVPB  Status:  Discontinued     1,250 mg 166.7 mL/hr over 90  Minutes Intravenous Every 12 hours 08/28/18 1725 08/30/18 0853   08/28/18 1500  piperacillin-tazobactam (ZOSYN) IVPB 3.375 g  Status:  Discontinued     3.375 g 12.5 mL/hr over 240 Minutes Intravenous Every 8 hours 08/28/18 1245 08/31/18 0844   08/28/18 0900  vancomycin (VANCOCIN) 1,500 mg in sodium chloride 0.9 % 500 mL IVPB     1,500 mg 250 mL/hr over 120 Minutes Intravenous  Once 08/28/18 0854 08/28/18 1218   08/28/18 0900  piperacillin-tazobactam (ZOSYN) IVPB 3.375 g     3.375 g 100 mL/hr over 30 Minutes Intravenous  Once 08/28/18 0854 08/28/18 0935     Subjective: Seen and Examined and states he is feeling slightly better but very weak.  Rest of breath is stable but denies any chest pain, lightheadedness or dizziness.  Attempt to drink clears on intermittent BiPAP and had a hard time swallowing his pills more no other concerns or complaints at this time  Objective: Vitals:   09/06/18 1200 09/06/18 1225 09/06/18 1600 09/06/18 1643  BP: (!) 90/54  (!) 109/57   Pulse: 97 94 (!) 107 100  Resp: (!) 22  (!) 29 (!) 26  Temp: 98.5 F (36.9 C)     TempSrc: Oral     SpO2: 94%  95% 94%  Weight:      Height:        Intake/Output Summary (Last 24 hours) at 09/06/2018 1732 Last data filed at 09/06/2018 1703 Gross per 24 hour  Intake 1959 ml  Output 2475 ml  Net -516 ml   Filed Weights   08/28/18 1400  Weight: 84.2 kg   Examination: Physical Exam:  Constitutional: Well-nourished, well-developed obese Caucasian male who appears very fatigued and is slightly uncomfortable today Eyes: Lids and conjunctive are normal.  Sclera anicteric ENMT: External ears and nose appear normal.  Grossly normal hearing Neck: Neck appears supple with no JVD Respiratory: Diminished air entry and diminished breath sounds bibasilarly with some rales but no appreciable wheezing, crackles, rhonchi.  Patient was wearing continuous BiPAP and had a slightly increased respiratory effort and mildly labored  breathing Cardiovascular: Tachycardic rate.  No appreciable murmurs, rubs, gallops.  S1-S2 auscultated Abdomen: Soft, nontender, nondistended.  Bowel sounds present all 4 quadrants GU: Deferred Musculoskeletal: No clubbing cyanosis.  Joint deformity noted Skin: No appreciable rashes or lesions on limited skin evaluation Neurologic: Cranial nerves II through XII grossly intact no appreciable focal deficits.  Romberg sign and cerebellar reflexes were not assessed Psychiatric: Normal judgment and insight.  Patient is awake, alert and oriented x3.  He is very fatigued.  Normal mood and slightly flat affect  Data Reviewed: I have personally reviewed following labs and imaging studies  CBC: Recent Labs  Lab 09/02/18 0615 09/03/18 0336 09/04/18 0323 09/05/18 0440 09/06/18 0443  WBC 5.8 6.0 6.5 7.9 9.2  NEUTROABS 5.4 5.6 6.0 7.4 8.6*  HGB 8.4* 7.7* 7.0* 7.7* 7.9*  HCT 25.1* 23.5* 21.5* 23.2* 23.6*  MCV 91.3 91.8 91.1 91.3 91.8  PLT 100* 93* 80* 78* 71*   Basic Metabolic Panel: Recent Labs  Lab 09/01/18 0555 09/02/18 0615 09/03/18 0336 09/04/18 0323 09/05/18 0440 09/06/18 0443  NA 136 131* 130* 128* 131* 128*  K 3.9 4.2 4.6 4.7 4.8 4.1  CL 101 95* 94* 94* 96* 93*  CO2 25 24 24 24 22 22   GLUCOSE 96 146* 294* 289* 271* 231*  BUN 17 15 14 15 16 20   CREATININE 0.37* 0.37* 0.40* 0.36* 0.41* 0.41*  CALCIUM 7.9* 7.9* 7.9* 7.8* 7.8* 7.8*  MG 1.9 2.4 2.6* 2.5*  --  2.4  PHOS  --   --   --   --   --  2.8   GFR: Estimated Creatinine Clearance: 112.7 mL/min (A) (by C-G formula based on SCr of 0.41 mg/dL (L)). Liver Function Tests: Recent Labs  Lab 08/31/18 0530 09/06/18 0443  AST 29 28  ALT 44 26  ALKPHOS 249* 183*  BILITOT 0.4 0.3  PROT 5.3* 4.9*  ALBUMIN 1.9* 1.6*   No results for input(s): LIPASE, AMYLASE in the last 168 hours. No results for input(s): AMMONIA in the last 168 hours. Coagulation Profile: No results for input(s): INR, PROTIME in the last 168 hours. Cardiac  Enzymes: No results for input(s): CKTOTAL, CKMB, CKMBINDEX, TROPONINI in the last 168 hours. BNP (last 3 results) No results for input(s): PROBNP in the last 8760 hours. HbA1C: No results for input(s): HGBA1C in the last 72 hours. CBG: Recent Labs  Lab 09/05/18 2346 09/06/18 0326 09/06/18 0805 09/06/18  1222 09/06/18 1635  GLUCAP 332* 196* 158* 183* 266*   Lipid Profile: No results for input(s): CHOL, HDL, LDLCALC, TRIG, CHOLHDL, LDLDIRECT in the last 72 hours. Thyroid Function Tests: No results for input(s): TSH, T4TOTAL, FREET4, T3FREE, THYROIDAB in the last 72 hours. Anemia Panel: No results for input(s): VITAMINB12, FOLATE, FERRITIN, TIBC, IRON, RETICCTPCT in the last 72 hours. Sepsis Labs: Recent Labs  Lab 08/31/18 0530 09/01/18 0555  PROCALCITON 3.20 2.28    Recent Results (from the past 240 hour(s))  MRSA PCR Screening     Status: None   Collection Time: 08/28/18  1:45 PM  Result Value Ref Range Status   MRSA by PCR NEGATIVE NEGATIVE Final    Comment:        The GeneXpert MRSA Assay (FDA approved for NASAL specimens only), is one component of a comprehensive MRSA colonization surveillance program. It is not intended to diagnose MRSA infection nor to guide or monitor treatment for MRSA infections. Performed at [facility], [place] [address] [place], [place] [address]   Culture, blood (x 2)     Status: None   Collection Time: 08/28/18  7:09 PM  Result Value Ref Range Status   Specimen Description   Final    BLOOD LEFT HAND Performed at [facility] [address], [place], [place] [address]    Special Requests   Final    BOTTLES DRAWN AEROBIC AND ANAEROBIC Blood Culture adequate volume Performed at [facility] [address], [place], [place] [address]    Culture   Final    NO GROWTH 5 DAYS Performed at [facility], [place] [address], [place], [place] [address]    Report Status 09/02/2018  FINAL  Final  Culture, blood (x 2)     Status: None   Collection Time: 08/28/18  7:15 PM  Result Value Ref Range Status   Specimen Description   Final    BLOOD RIGHT HAND Performed at [facility] [address], [place], [place] [address]    Special Requests   Final    BOTTLES DRAWN AEROBIC AND ANAEROBIC Blood Culture adequate volume Performed at [facility] [address], [place], [place] [address]    Culture   Final    NO GROWTH 5 DAYS Performed at [facility], [place] [address], [place], [place] [address]    Report Status 09/02/2018 FINAL  Final  Respiratory Panel by PCR     Status: None   Collection Time: 09/01/18 11:46 AM  Result Value Ref Range Status   Adenovirus NOT DETECTED NOT DETECTED Final   Coronavirus 229E NOT DETECTED NOT DETECTED Final   Coronavirus HKU1 NOT DETECTED NOT DETECTED Final   Coronavirus NL63 NOT DETECTED NOT DETECTED Final   Coronavirus OC43 NOT DETECTED NOT DETECTED Final   Metapneumovirus NOT DETECTED NOT DETECTED Final   Rhinovirus / Enterovirus NOT DETECTED NOT DETECTED Final   Influenza A NOT DETECTED NOT DETECTED Final   Influenza B NOT DETECTED NOT DETECTED Final   Parainfluenza Virus 1 NOT DETECTED NOT DETECTED Final   Parainfluenza Virus 2 NOT DETECTED NOT DETECTED Final   Parainfluenza Virus 3 NOT DETECTED NOT DETECTED Final   Parainfluenza Virus 4 NOT DETECTED NOT DETECTED Final   Respiratory Syncytial Virus NOT DETECTED NOT DETECTED Final   Bordetella pertussis NOT DETECTED NOT DETECTED Final   Chlamydophila pneumoniae NOT DETECTED NOT DETECTED Final   Mycoplasma pneumoniae NOT DETECTED  NOT DETECTED Final    Comment: Performed at [facility], [place] [address] [place], [place] [address]    Radiology Studies: Dg Chest Port 1 View  Result Date: 09/06/2018 CLINICAL DATA:  Shortness of breath EXAM: PORTABLE CHEST 1 VIEW COMPARISON:  09/05/2018 FINDINGS: Cardiac shadow is stable. Right  chest wall port is again seen. Patchy infiltrates in the mid and lower lung fields bilaterally are again identified and stable. No sizable effusion is seen. No acute bony abnormality is noted. IMPRESSION: Stable bilateral infiltrates Electronically Signed   By: Inez Catalina M.D.   On: 09/06/2018 07:20   Dg Chest Port 1 View  Result Date: 09/05/2018 CLINICAL DATA:  Shortness of Breath EXAM: PORTABLE CHEST 1 VIEW COMPARISON:  09/04/2018 FINDINGS: Right Port-A-Cath remains in place, unchanged. Bilateral airspace opacities slightly improved since prior study, particularly on the left. Continued patchy opacities in the mid and right lower lung and left lower lung. Mild cardiomegaly. No effusions or acute bony abnormality. IMPRESSION: Patchy mid and lower lung bilateral airspace opacities. Overall, airspace disease slightly improved since prior study. Electronically Signed   By: Rolm Baptise M.D.   On: 09/05/2018 09:24   Scheduled Meds: . atorvastatin  10 mg Oral q1800  . chlorhexidine  15 mL Mouth Rinse BID  . Chlorhexidine Gluconate Cloth  6 each Topical Daily  . furosemide  40 mg Intravenous Daily  . gabapentin  300 mg Oral TID  . insulin aspart  0-15 Units Subcutaneous Q4H  . insulin glargine  33 Units Subcutaneous QHS  . lisinopril  2.5 mg Oral Daily  . loratadine  10 mg Oral Daily  . LORazepam  0.5 mg Intravenous 2 times per day  . LORazepam  1 mg Intravenous QHS  . mouth rinse  15 mL Mouth Rinse q12n4p  . methylPREDNISolone (SOLU-MEDROL) injection  60 mg Intravenous Q6H  . morphine  15 mg Oral Daily  . multivitamin with minerals  1 tablet Oral Daily  . nystatin  5 mL Oral QID  . pantoprazole  40 mg Oral BID  . polyvinyl alcohol  1 drop Both Eyes TID  . protein supplement shake  11 oz Oral TID BM  . sodium chloride flush  3 mL Intravenous Q12H  . tamsulosin  0.4 mg Oral QPC breakfast   Continuous Infusions: . sodium chloride Stopped (08/30/18 0808)  . sodium chloride Stopped (09/06/18  1135)  . fluconazole (DIFLUCAN) IV Stopped (09/06/18 1335)  . levofloxacin (LEVAQUIN) IV Stopped (09/06/18 1616)  . sulfamethoxazole-trimethoprim Stopped (09/06/18 1606)  . vancomycin Stopped (09/06/18 1128)    LOS: 9 days   Kerney Elbe, DO [facility] PAGER is on [place]  If 7PM-7AM, please contact night-coverage [email or web address] Password [facility] 09/06/2018, 5:32 PM

## 2018-09-07 ENCOUNTER — Inpatient Hospital Stay (HOSPITAL_COMMUNITY): Payer: 59

## 2018-09-07 LAB — CBC WITH DIFFERENTIAL/PLATELET
Band Neutrophils: 9 %
Basophils Absolute: 0 10*3/uL (ref 0.0–0.1)
Basophils Relative: 0 %
Eosinophils Absolute: 0 10*3/uL (ref 0.0–0.7)
Eosinophils Relative: 0 %
HCT: 25.6 % — ABNORMAL LOW (ref 39.0–52.0)
Hemoglobin: 8.4 g/dL — ABNORMAL LOW (ref 13.0–17.0)
LYMPHS ABS: 0 10*3/uL — AB (ref 0.7–4.0)
LYMPHS PCT: 0 %
MCH: 30.7 pg (ref 26.0–34.0)
MCHC: 32.8 g/dL (ref 30.0–36.0)
MCV: 93.4 fL (ref 78.0–100.0)
MONO ABS: 0 10*3/uL — AB (ref 0.1–1.0)
Metamyelocytes Relative: 4 %
Monocytes Relative: 0 %
Myelocytes: 1 %
NEUTROS PCT: 86 %
Neutro Abs: 11.4 10*3/uL — ABNORMAL HIGH (ref 1.7–7.7)
PLATELETS: 69 10*3/uL — AB (ref 150–400)
RBC: 2.74 MIL/uL — AB (ref 4.22–5.81)
RDW: 19.1 % — AB (ref 11.5–15.5)
WBC: 11.4 10*3/uL — AB (ref 4.0–10.5)
nRBC: 3 /100 WBC — ABNORMAL HIGH

## 2018-09-07 LAB — GLUCOSE, CAPILLARY
GLUCOSE-CAPILLARY: 179 mg/dL — AB (ref 70–99)
GLUCOSE-CAPILLARY: 265 mg/dL — AB (ref 70–99)
Glucose-Capillary: 175 mg/dL — ABNORMAL HIGH (ref 70–99)
Glucose-Capillary: 189 mg/dL — ABNORMAL HIGH (ref 70–99)
Glucose-Capillary: 247 mg/dL — ABNORMAL HIGH (ref 70–99)
Glucose-Capillary: 254 mg/dL — ABNORMAL HIGH (ref 70–99)
Glucose-Capillary: 272 mg/dL — ABNORMAL HIGH (ref 70–99)

## 2018-09-07 LAB — PHOSPHORUS: Phosphorus: 3.5 mg/dL (ref 2.5–4.6)

## 2018-09-07 LAB — COMPREHENSIVE METABOLIC PANEL
ALT: 27 U/L (ref 0–44)
AST: 30 U/L (ref 15–41)
Albumin: 1.9 g/dL — ABNORMAL LOW (ref 3.5–5.0)
Alkaline Phosphatase: 196 U/L — ABNORMAL HIGH (ref 38–126)
Anion gap: 12 (ref 5–15)
BILIRUBIN TOTAL: 0.4 mg/dL (ref 0.3–1.2)
BUN: 21 mg/dL (ref 8–23)
CHLORIDE: 96 mmol/L — AB (ref 98–111)
CO2: 23 mmol/L (ref 22–32)
CREATININE: 0.46 mg/dL — AB (ref 0.61–1.24)
Calcium: 8.2 mg/dL — ABNORMAL LOW (ref 8.9–10.3)
GFR calc Af Amer: >60 mL/min (ref >60)
GLUCOSE: 186 mg/dL — AB (ref 70–99)
Potassium: 4.4 mmol/L (ref 3.5–5.1)
Sodium: 131 mmol/L — ABNORMAL LOW (ref 135–145)
TOTAL PROTEIN: 5 g/dL — AB (ref 6.5–8.1)

## 2018-09-07 LAB — MAGNESIUM: MAGNESIUM: 2.5 mg/dL — AB (ref 1.7–2.4)

## 2018-09-07 MED ORDER — METHYLPREDNISOLONE SODIUM SUCC 125 MG IJ SOLR
60.0000 mg | Freq: Three times a day (TID) | INTRAMUSCULAR | Status: DC
  Administered 2018-09-07 – 2018-09-09 (×6): 60 mg via INTRAVENOUS
  Filled 2018-09-07 (×6): qty 2

## 2018-09-07 NOTE — Progress Notes (Signed)
Palliative care  I saw and discussed with patient's son in the hall today.    Mr. Lisbon is resting so I did not examine him today.  Plan to continue with current care, evaluate daily.  Please call if there are specific needs with which we can be of assistance.  Micheline Rough, MD [place] [facility] Team [phone]  NO CHARGE NOTE

## 2018-09-07 NOTE — Progress Notes (Signed)
PROGRESS NOTE    Allen Glenn  LPF:[id number] DOB: 01-29-1956 DOA: 08/28/2018 PCP: [name], MD  Brief Narrative:  Allen Glenn is a 62 year old male with past medical history of diabetes, CAD, hypertension and metastatic colon cancer on chemotherapy 8/27 who presented to the emergency department complaining of fever, fatigue and tachycardia.  Upon ED evaluation patient was found to be hypoxic, elevated lactic acid and febrile.  Patient was admitted with working diagnosis of sepsis of unknown origin and started on empiric antibiotics. Found to have severe bilateral pneumonia. Decompensated started on BiPAP, have developed a picture of ARDS however patient DNR/DNI. ID and PCCM consulted. He Continues to be on Continuous BiPAP but FIO2 currently weaning and has gone from 95% to 50% and his saturations are maintaining for now.   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  -CT chest performed 9/7 shows extensive opacities throughout both lungs. Small bilateral pleural effusion and bilateral lung nodule compatible with metastatic disease.   -CXR this AM showed Bilateral infiltrates with very mild improvement when compare with the previous day -Some improvement  in oxygenation overnight,  -will continue Lasix daily 40 mg, monitor renal function closely.   -Continue BiPAP if oxygenation remains stable can start titrating FiO2. He is now down to 50% FiO2 -Continue current antibiotic therapy with Bactrim, vancomycin, Levaquin and Diflucan for now.   -Continue Solu-Medrol 60 mg every 6 hours for possible pneumonitis and will taper to 60 mg q8h today   -Continue supportive treatment with Ativan and morphine for Pain and breathing  -ID following.  Neutropenia improved. -Palliative Care for [place] and continue to discuss daily and will monitor daily  -Not eating significantly and only eating [place] and hoping to come off BiPAP to have SLP evaluation   Acute Respiratory Failure with Hypoxia, slightly improving -Multifactorial due to multifocal pneumonia, fluid overload and now with poss ARDS.   -Patient and family decided on DNR/DNI.   -Patient remains dependent on BiPAP, will try to wean FiO2 if oxygenation  -Remained above 95% earlier but weaned to 50%, if does not respond he might need to opt for comfort care measures.   -Palliative care recommendations appreciated and continuing to Monitor -Day 5 on Continuous BiPAP today   Thrombocytopenia - stable  -In setting of chemotherapy and sepsis.  -No signs of overt bleeding -Platelet Count is 69,000  Stage IV colon cancer with metastasis to lungs, bone and brain -Being follow by Dr Benay Spice. Management per Oncology  -[name] feel that clinical presentation is less likely related to Tumor Progress in the Lungs or Drug-Induced Pneumonitis  -Continuing Treatment as above  HTN -BP remains stable but on the lower side, continue lisinopril. -BP  was 122/68  Sinus Tachycardia  -Tachycardia has improved some but likely related to Respiratory issues; felt to be related to sepsis and respiratory failure.   -Echo with no abnormalities.   -C/w IV metoprolol as needed for heart rate above 130  bpm's.  Type 2 DM   -Last A1c 7.9.  -CBGs now elevated in setting of high-dose steroids. -Patient n.p.o. will continue sliding scale for now but changed to Novolog 0-15 units q4H and increase lantus to 33 units qHS -Continue to monitor CBGs, suspect to improve with tapering steroids. -CBG's ranging from 175-314  Normocytic Anemia -Patient's hemoglobin/hematocrit slightly improved from yesterday and is now 8.4/25.6 -Continue monitor for signs and symptoms of bleeding -Repeat CBC in the a.m.  Hyponatremia -Patient's sodium was 128 -Improved to 131 this morning and will continue with IV Lasix as necessary -Repeat CMP in a.m.  Goals of cares DNR/DNI, continue to monitor over the next 24 hours and take it day by Day. Slightly improved Respiratory wise today but very fatigued.  DVT prophylaxis: SCDs Code Status: DO NOT RESUSCITATE Family Communication: Discussed with Wife and Daughter Disposition Plan: Pending on clinical course   Consultants:   Oncology  ID  Palliative Care   Procedures: None   Antimicrobials:  Anti-infectives (From admission, onward)   Start     Dose/Rate Route Frequency Ordered Stop   09/05/18 2200  vancomycin (VANCOCIN) IVPB 1000 mg/200 mL premix     1,000 mg 200 mL/hr over 60 Minutes Intravenous Every 12 hours 09/05/18 1353     09/01/18 1800  sulfamethoxazole-trimethoprim (BACTRIM) 420 mg in dextrose 5 % 500 mL IVPB     420 mg 350.8 mL/hr over 90 Minutes Intravenous Every 8 hours 09/01/18 1529     09/01/18 1000  fluconazole (DIFLUCAN) IVPB 400 mg     400 mg 100 mL/hr over 120 Minutes Intravenous Every 24 hours 09/01/18 0838     08/31/18 2200  vancomycin (VANCOCIN) 1,250 mg in sodium chloride 0.9 % 250 mL IVPB  Status:  Discontinued     1,250 mg 166.7 mL/hr over 90 Minutes Intravenous Every 12 hours 08/31/18 0852 09/05/18 1353   08/31/18 1000  meropenem (MERREM) 1 g in sodium chloride 0.9 % 100 mL IVPB  Status:  Discontinued     1 g 200 mL/hr  over 30 Minutes Intravenous Every 8 hours 08/31/18 0848 09/01/18 1546   08/31/18 1000  vancomycin (VANCOCIN) 1,750 mg in sodium chloride 0.9 % 500 mL IVPB     1,750 mg 250 mL/hr over 120 Minutes Intravenous  Once 08/31/18 0852 08/31/18 1146   08/29/18 1400  levofloxacin (LEVAQUIN) IVPB 750 mg     750 mg 100 mL/hr over 90 Minutes Intravenous Every 24 hours 08/29/18 1353     08/28/18 2000  vancomycin (VANCOCIN) 1,250 mg in sodium chloride 0.9 % 250 mL IVPB  Status:  Discontinued     1,250 mg 166.7 mL/hr over 90 Minutes Intravenous Every 12 hours 08/28/18 1725 08/30/18 0853   08/28/18 1500  piperacillin-tazobactam (ZOSYN) IVPB 3.375 g  Status:  Discontinued     3.375 g 12.5 mL/hr over 240 Minutes Intravenous Every 8 hours 08/28/18 1245 08/31/18 0844   08/28/18 0900  vancomycin (VANCOCIN) 1,500 mg in sodium chloride 0.9 % 500 mL IVPB     1,500 mg 250 mL/hr over 120 Minutes Intravenous  Once 08/28/18 0854 08/28/18 1218   08/28/18 0900  piperacillin-tazobactam (ZOSYN) IVPB 3.375 g     3.375 g 100 mL/hr over 30  Minutes Intravenous  Once 08/28/18 0854 08/28/18 0935     Subjective: Seen and Examined and states he was getting fed some apple sauce from wife with BiPAP on the side of his face.  Stated he was feeling weak however her breathing status was okay.  No chest pain, lightheadedness or dizziness.  Hoping to get off of BiPAP so he can finally eat.  No other concerns or complaints at this time.  Objective: Vitals:   09/07/18 0740 09/07/18 0800 09/07/18 1200 09/07/18 1600  BP:  114/64 122/68   Pulse: 87 88 93   Resp: (!) 30 19 (!) 22   Temp:  (!) 96.5 F (35.8 C) (!) 96.9 F (36.1 C) 98 F (36.7 C)  TempSrc:  Axillary Axillary Axillary  SpO2: 95% 94% 95%   Weight:      Height:        Intake/Output Summary (Last 24 hours) at 09/07/2018 1622 Last data filed at 09/07/2018 1223 Gross per 24 hour  Intake 700 ml  Output 3300 ml  Net -2600 ml   Filed Weights   08/28/18 1400  Weight:  84.2 kg   Examination: Physical Exam:  Constitutional: Patient is a well-nourished, well-developed obese Caucasian male whose very fatigued appearing and wearing BiPAP.  He appears slightly uncomfortable Eyes: Lids and conjunctive are normal.  Sclera anicteric ENMT: External ears and nose appear normal.  Grossly normal hearing Neck: Neck appears supple no JVD Respiratory: Significantly diminished air entry with diminished breath sounds bibasilarly with some rales but no appreciable wheezing, crackles or rhonchi.  Patient is continually wearing his BiPAP and has a slightly increased respiratory effort with mildly labored breathing Cardiovascular: Tachycardic rate.  No appreciable murmurs, rubs, gallops.  S1-S2 auscultated Abdomen: Soft, nontender, nondistended.  Bowel sounds present all 4 quadrants GU: Deferred Musculoskeletal: No clubbing or cyanosis.  No joint deformities noted Skin: No appreciable rashes or lesions on limited skin evaluation Neurologic: Cranial nerves II through XII grossly intact no apparent focal deficits Psychiatric: Normal judgment and insight.  Patient is awake, alert, and oriented x3.  Very fatigued and has a slightly withdrawn and mildly depressed mood but flat affect   Data Reviewed: I have personally reviewed following labs and imaging studies  CBC: Recent Labs  Lab 09/03/18 0336 09/04/18 0323 09/05/18 0440 09/06/18 0443 09/07/18 0430  WBC 6.0 6.5 7.9 9.2 11.4*  NEUTROABS 5.6 6.0 7.4 8.6* 11.4*  HGB 7.7* 7.0* 7.7* 7.9* 8.4*  HCT 23.5* 21.5* 23.2* 23.6* 25.6*  MCV 91.8 91.1 91.3 91.8 93.4  PLT 93* 80* 78* 71* 69*   Basic Metabolic Panel: Recent Labs  Lab 09/02/18 0615 09/03/18 0336 09/04/18 0323 09/05/18 0440 09/06/18 0443 09/07/18 0430  NA 131* 130* 128* 131* 128* 131*  K 4.2 4.6 4.7 4.8 4.1 4.4  CL 95* 94* 94* 96* 93* 96*  CO2 24 24 24 22 22 23   GLUCOSE 146* 294* 289* 271* 231* 186*  BUN 15 14 15 16 20 21   CREATININE 0.37* 0.40* 0.36*  0.41* 0.41* 0.46*  CALCIUM 7.9* 7.9* 7.8* 7.8* 7.8* 8.2*  MG 2.4 2.6* 2.5*  --  2.4 2.5*  PHOS  --   --   --   --  2.8 3.5   GFR: Estimated Creatinine Clearance: 112.7 mL/min (A) (by C-G formula based on SCr of 0.46 mg/dL (L)). Liver Function Tests: Recent Labs  Lab 09/06/18 0443 09/07/18 0430  AST 28 30  ALT 26 27  ALKPHOS 183* 196*  BILITOT 0.3 0.4  PROT 4.9* 5.0*  ALBUMIN 1.6* 1.9*   No results for input(s): LIPASE, AMYLASE in the last 168 hours. No results for input(s): AMMONIA in the last 168 hours. Coagulation Profile: No results for input(s): INR, PROTIME in the last 168 hours. Cardiac Enzymes: No results for input(s): CKTOTAL, CKMB, CKMBINDEX, TROPONINI in the last 168 hours. BNP (last 3 results) No results for input(s): PROBNP in the last 8760 hours. HbA1C: No results for input(s): HGBA1C in the last 72 hours. CBG: Recent Labs  Lab 09/07/18 0008 09/07/18 0357 09/07/18 0756 09/07/18 1208 09/07/18 1547  GLUCAP 272* 175* 179* 189* 247*   Lipid Profile: No results for input(s): CHOL, HDL, LDLCALC, TRIG, CHOLHDL, LDLDIRECT in the last 72 hours. Thyroid Function Tests: No results for input(s): TSH, T4TOTAL, FREET4, T3FREE, THYROIDAB in the last 72 hours. Anemia Panel: No results for input(s): VITAMINB12, FOLATE, FERRITIN, TIBC, IRON, RETICCTPCT in the last 72 hours. Sepsis Labs: Recent Labs  Lab 09/01/18 0555  PROCALCITON 2.28    Recent Results (from the past 240 hour(s))  Culture, blood (x 2)     Status: None   Collection Time: 08/28/18  7:09 PM  Result Value Ref Range Status   Specimen Description   Final    BLOOD LEFT HAND Performed at [facility], [place] [address], [place], [place] [address]    Special Requests   Final    BOTTLES DRAWN AEROBIC AND ANAEROBIC Blood Culture adequate volume Performed at [place] [address], [place], [place] [address]    Culture   Final    NO GROWTH 5 DAYS Performed at  [facility], [place] [address], [place], [place] [address]    Report Status 09/02/2018 FINAL  Final  Culture, blood (x 2)     Status: None   Collection Time: 08/28/18  7:15 PM  Result Value Ref Range Status   Specimen Description   Final    BLOOD RIGHT HAND Performed at [place] [address] [place], [place] [address]    Special Requests   Final    BOTTLES DRAWN AEROBIC AND ANAEROBIC Blood Culture adequate volume Performed at [place] [address], [place], [place] [address]    Culture   Final    NO GROWTH 5 DAYS Performed at [facility], [place] [address], [place],  [address]    Report Status 09/02/2018 FINAL  Final  Respiratory Panel by PCR     Status: None   Collection Time: 09/01/18 11:46 AM  Result Value Ref Range Status   Adenovirus NOT DETECTED NOT DETECTED Final   Coronavirus 229E NOT DETECTED NOT DETECTED Final   Coronavirus HKU1 NOT DETECTED NOT DETECTED Final   Coronavirus NL63 NOT DETECTED NOT DETECTED Final   Coronavirus OC43 NOT DETECTED NOT DETECTED Final   Metapneumovirus NOT DETECTED NOT DETECTED Final   Rhinovirus / Enterovirus NOT DETECTED NOT DETECTED Final   Influenza A NOT DETECTED NOT DETECTED Final   Influenza B NOT DETECTED NOT DETECTED Final   Parainfluenza Virus 1 NOT DETECTED NOT DETECTED Final   Parainfluenza Virus 2 NOT DETECTED NOT DETECTED Final   Parainfluenza Virus 3 NOT DETECTED NOT DETECTED Final   Parainfluenza Virus 4 NOT DETECTED NOT DETECTED Final   Respiratory Syncytial Virus NOT DETECTED NOT DETECTED Final   Bordetella pertussis NOT DETECTED NOT DETECTED Final   Chlamydophila pneumoniae NOT DETECTED NOT DETECTED Final   Mycoplasma pneumoniae NOT DETECTED NOT DETECTED  Final    Comment: Performed at [facility], [place] [address], [place], [place] [address]    Radiology Studies: Dg Chest Port 1 View  Result Date: 09/07/2018 CLINICAL DATA:  The shortness of breath  EXAM: PORTABLE CHEST 1 VIEW COMPARISON:  09/06/2018 FINDINGS: Cardiac shadow is stable. Right-sided chest wall port is again noted and stable. Patchy infiltrates are again identified in both lungs although mildly improved in the bases bilaterally. No bony abnormality is noted. IMPRESSION: Bilateral infiltrates with very mild improvement when compare with the previous day. Electronically Signed   By: Inez Catalina M.D.   On: 09/07/2018 07:03   Dg Chest Port 1 View  Result Date: 09/06/2018 CLINICAL DATA:  Shortness of breath EXAM: PORTABLE CHEST 1 VIEW COMPARISON:  09/05/2018 FINDINGS: Cardiac shadow is stable. Right chest wall port is again seen. Patchy infiltrates in the mid and lower lung fields bilaterally are again identified and stable. No sizable effusion is seen. No acute bony abnormality is noted. IMPRESSION: Stable bilateral infiltrates Electronically Signed   By: Inez Catalina M.D.   On: 09/06/2018 07:20   Scheduled Meds: . atorvastatin  10 mg Oral q1800  . chlorhexidine  15 mL Mouth Rinse BID  . Chlorhexidine Gluconate Cloth  6 each Topical Daily  . furosemide  40 mg Intravenous Daily  . gabapentin  300 mg Oral TID  . insulin aspart  0-15 Units Subcutaneous Q4H  . insulin glargine  33 Units Subcutaneous QHS  . lisinopril  2.5 mg Oral Daily  . loratadine  10 mg Oral Daily  . LORazepam  0.5 mg Intravenous 2 times per day  . LORazepam  1 mg Intravenous QHS  . mouth rinse  15 mL Mouth Rinse q12n4p  . methylPREDNISolone (SOLU-MEDROL) injection  60 mg Intravenous Q6H  . morphine  15 mg Oral Daily  . multivitamin with minerals  1 tablet Oral Daily  . nystatin  5 mL Oral QID  . pantoprazole  40 mg Oral BID  . polyvinyl alcohol  1 drop Both Eyes TID  . protein supplement shake  11 oz Oral TID BM  . sodium chloride flush  3 mL Intravenous Q12H  . tamsulosin  0.4 mg Oral QPC breakfast   Continuous Infusions: . sodium chloride Stopped (08/30/18 0808)  . sodium chloride Stopped (09/06/18  1135)  . fluconazole (DIFLUCAN) IV Stopped (09/07/18 1130)  . levofloxacin (LEVAQUIN) IV 750 mg (09/07/18 1558)  . sulfamethoxazole-trimethoprim Stopped (09/07/18 1450)  . vancomycin Stopped (09/07/18 1038)    LOS: 10 days   Kerney Elbe, DO [facility] PAGER is on [place]  If 7PM-7AM, please contact night-coverage [email or web address] Password [facility] 09/07/2018, 4:22 PM

## 2018-09-07 NOTE — Progress Notes (Signed)
Pt was awake during our visit and although pretty non-verbal his son, who was bedside, was able to tell me of pt's wants. Pt wanted prayer during our visit. I also offered additional assistance and hydration to pt's son. Had prayer w/pt and son for which he expressed a thank you. Please page if additional support is needed. [place], MDiv   09/07/18 1500  Clinical Encounter Type  Visited With Patient and family together

## 2018-09-08 ENCOUNTER — Inpatient Hospital Stay (HOSPITAL_COMMUNITY): Payer: 59

## 2018-09-08 LAB — CBC WITH DIFFERENTIAL/PLATELET
Basophils Absolute: 0 10*3/uL (ref 0.0–0.1)
Basophils Relative: 0 %
EOS ABS: 0 10*3/uL (ref 0.0–0.7)
EOS PCT: 0 %
HCT: 25.5 % — ABNORMAL LOW (ref 39.0–52.0)
HEMOGLOBIN: 8.4 g/dL — AB (ref 13.0–17.0)
LYMPHS ABS: 0.1 10*3/uL — AB (ref 0.7–4.0)
Lymphocytes Relative: 1 %
MCH: 30.7 pg (ref 26.0–34.0)
MCHC: 32.9 g/dL (ref 30.0–36.0)
MCV: 93.1 fL (ref 78.0–100.0)
MONO ABS: 0.3 10*3/uL (ref 0.1–1.0)
Monocytes Relative: 3 %
NEUTROS PCT: 96 %
Neutro Abs: 11.1 10*3/uL — ABNORMAL HIGH (ref 1.7–7.7)
PLATELETS: 66 10*3/uL — AB (ref 150–400)
RBC: 2.74 MIL/uL — AB (ref 4.22–5.81)
RDW: 19 % — AB (ref 11.5–15.5)
WBC: 11.5 10*3/uL — AB (ref 4.0–10.5)
nRBC: 1 /100 WBC — ABNORMAL HIGH

## 2018-09-08 LAB — GLUCOSE, CAPILLARY
GLUCOSE-CAPILLARY: 159 mg/dL — AB (ref 70–99)
GLUCOSE-CAPILLARY: 188 mg/dL — AB (ref 70–99)
GLUCOSE-CAPILLARY: 251 mg/dL — AB (ref 70–99)
Glucose-Capillary: 146 mg/dL — ABNORMAL HIGH (ref 70–99)
Glucose-Capillary: 216 mg/dL — ABNORMAL HIGH (ref 70–99)

## 2018-09-08 LAB — MAGNESIUM: Magnesium: 2.5 mg/dL — ABNORMAL HIGH (ref 1.7–2.4)

## 2018-09-08 LAB — COMPREHENSIVE METABOLIC PANEL
ALT: 24 U/L (ref 0–44)
ANION GAP: 12 (ref 5–15)
AST: 26 U/L (ref 15–41)
Albumin: 1.8 g/dL — ABNORMAL LOW (ref 3.5–5.0)
Alkaline Phosphatase: 176 U/L — ABNORMAL HIGH (ref 38–126)
BUN: 19 mg/dL (ref 8–23)
CHLORIDE: 92 mmol/L — AB (ref 98–111)
CO2: 23 mmol/L (ref 22–32)
Calcium: 7.9 mg/dL — ABNORMAL LOW (ref 8.9–10.3)
Creatinine, Ser: 0.43 mg/dL — ABNORMAL LOW (ref 0.61–1.24)
GFR calc non Af Amer: >60 mL/min (ref >60)
Glucose, Bld: 145 mg/dL — ABNORMAL HIGH (ref 70–99)
POTASSIUM: 4.8 mmol/L (ref 3.5–5.1)
SODIUM: 127 mmol/L — AB (ref 135–145)
Total Bilirubin: 0.2 mg/dL — ABNORMAL LOW (ref 0.3–1.2)
Total Protein: 4.9 g/dL — ABNORMAL LOW (ref 6.5–8.1)

## 2018-09-08 LAB — PHOSPHORUS: Phosphorus: 3.4 mg/dL (ref 2.5–4.6)

## 2018-09-08 MED ORDER — BISACODYL 10 MG RE SUPP
10.0000 mg | Freq: Once | RECTAL | Status: DC
  Filled 2018-09-08: qty 1

## 2018-09-08 MED ORDER — PANTOPRAZOLE SODIUM 40 MG IV SOLR
40.0000 mg | Freq: Two times a day (BID) | INTRAVENOUS | Status: DC
  Administered 2018-09-08 – 2018-09-10 (×5): 40 mg via INTRAVENOUS
  Filled 2018-09-08 (×5): qty 40

## 2018-09-08 MED ORDER — METOCLOPRAMIDE HCL 5 MG/ML IJ SOLN: 10.0000 mg | Freq: Two times a day (BID) | INTRAMUSCULAR | Status: DC | PRN

## 2018-09-08 NOTE — Progress Notes (Signed)
Patient ID: Allen Glenn, male   DOB: [birth year], 62 y.o.   MRN: [id number]  This NP visited patient at the bedside as a follow up to  yesterday's [place].  Wife and two children at bedside.   Patient has been converted to HFNC and appears comfortable, discussed the importance of conversation regarding escalation of care back to Bipap if/when patient decompensate.   Patient and his family support use of BiPap again "if necessary".  All remain hopeful for "more time"  Morphine in place for symptoms.  Created space and opportunity for family to express thoughts and feelings.  [name]/wife speaks to her great love of her husband and family.  Emotional support offered.  Discussed with wife  the importance of continued conversation with her husband, family regarding overall plan of care and treatment options,  ensuring decisions are within the patient's best interest and GOCs.  Questions and concerns addressed      Total time spent on the unit was 35   minutes  Greater than 50% of the time was spent in counseling and coordination of care  [name] NP  Palliative Medicine Team Team Phone # [phone] Pager [phone]

## 2018-09-08 NOTE — Care Management Note (Signed)
Case Management Note  Patient Details  Name: REHMAN LEVINSON MRN: [id number] Date of Birth: [birth year]  Subjective/Objective:                  Severe bilateral pneumonia/on BiPap at 50%,Iv solumedrol and nebs, Iv diflucan,Iv bactrim,Iv vancomycin,  WBC=11.5 Plartelets =66  Action/Plan: Palliative care is working with the family who does wants to continue aggressive care/ following for progression and cm needs  Expected Discharge Date:  (unknown)               Expected Discharge Plan:  Home/Self Care  In-House Referral:     Discharge planning Services  CM Consult  Post Acute Care Choice:    Choice offered to:     DME Arranged:    DME Agency:     HH Arranged:    HH Agency:     Status of Service:  In process, will continue to follow  If discussed at Long Length of Stay Meetings, dates discussed:    Additional Comments:  [name], RN 09/08/2018, 9:15 AM

## 2018-09-08 NOTE — Progress Notes (Signed)
PROGRESS NOTE    Allen Glenn  ZOX:[id number] DOB: [birth year] DOA: 08/28/2018 PCP: [name], MD  Brief Narrative:  Allen Glenn is a 62 year old male with past medical history of diabetes, CAD, hypertension and metastatic colon cancer on chemotherapy 8/27 who presented to the emergency department complaining of fever, fatigue and tachycardia.  Upon ED evaluation patient was found to be hypoxic, elevated lactic acid and febrile.  Patient was admitted with working diagnosis of sepsis of unknown origin and started on empiric antibiotics. Found to have severe bilateral pneumonia. Decompensated started on BiPAP, have developed a picture of ARDS however patient DNR/DNI. ID and PCCM consulted. He Continues to be on Continuous BiPAP but FIO2 currently weaning and has gone from 95% to 50% and his saturations are maintaining for now but today I was able to wean him to 11 Liters of HHFNC. SLP evaluated as well and patient to undergo MBS in AM.   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  -CT chest performed 9/7 shows extensive opacities throughout both lungs. Small bilateral pleural effusion and bilateral lung nodule compatible with metastatic disease.     -CXR this AM personally reviewed and shows mild improvement and essentially stable since yesterday  -Some improvement in oxygenation overnight,  -will continue Lasix daily 40 mg, monitor renal function closely.   -Continue BiPAP if oxygenation remains stable can start titrating FiO2. He is now down to 50% FiO2 on BiPAP and I was able to wean to 11 Liters of HHFNC -Continue current antibiotic therapy with Bactrim, vancomycin, Levaquin and Diflucan for now.  Dr. Megan Salon recommending waiting 14 days of vancomycin levofloxacin and 10 days of fluconazole and then continuing Bactrim for a longer course -Continue Solu-Medrol 60 mg every 6 hours for possible pneumonitis and taperd to 60 mg q8h today   -Continue supportive treatment with Ativan and morphine for Pain and breathing  -ID following.  Neutropenia improved. -Palliative Care for [place] and continue to discuss daily and will monitor daily  -Not eating significantly and only eating [place] and hoping to come off BiPAP to have SLP evaluation; able to do that today and SLP to do MBS in the morning  Acute Respiratory Failure with Hypoxia, slightly improving -Multifactorial due to multifocal pneumonia, fluid overload and now with poss ARDS.   -Patient and family decided on DNR/DNI.   -Patient remained dependent on BiPAP, will try to wean FiO2 if oxygenation is able to wean him to heated high flow nasal cannula with 11 L -Remained above 95% earlier but weaned to 50% and I transitioned him to hit high flow nasal cannula.  If unable to tolerate this will need to go back on BiPAP  -Palliative  care recommendations appreciated and continuing to Monitor -Day 6 on Continuous BiPAP today thankfully I was able to wean him to heated high flow nasal cannula  Thrombocytopenia - stable  -In setting of chemotherapy and sepsis.  -No signs of overt bleeding -Platelet Count is 66,000  Stage IV colon cancer with metastasis to lungs, bone and brain -Being  follow by Dr Benay Spice. Management per Oncology  -[name] feel that clinical presentation is less likely related to Tumor Progress in the Lungs or Drug-Induced Pneumonitis  -Continuing Treatment as above  HTN -BP remains stable but on the lower side -Lisinopril discontinued . -BP was 122/68  Sinus Tachycardia, improving  -Tachycardia has improved some but likely related to Respiratory issues; felt to be related to sepsis and respiratory failure.   -Echo with no abnormalities.   -C/w IV metoprolol as needed for heart rate above 130 bpm's.  Type 2 DM   -Last A1c 7.9.  -CBGs now elevated in setting of high-dose steroids. -Patient n.p.o. will continue sliding scale for now but changed to Novolog 0-15 units q4H and increase lantus to 33 units qHS -Continue to monitor CBGs, suspect to improve with tapering steroids. -CBG's ranging from 146-251  Normocytic Anemia -Patient's hemoglobin/hematocrit slightly improved from yesterday and is now 8.4/25.5 -Continue monitor for signs and symptoms of bleeding -Repeat CBC in the a.m.  Hyponatremia -Patient's sodium was 127 This AM -Continue with IV Lasix as necessary -Repeat CMP in a.m.  Goals of cares DNR/DNI, continue to monitor over the next 24 hours and take it day by Day. Slightly improved Respiratory wise today but very fatigued and able to be weaned to BiPAP  DVT prophylaxis: SCDs Code Status: DO NOT RESUSCITATE Family Communication: Discussed with Wife and Daughter and Son at bedside  Disposition Plan: Pending on clinical course   Consultants:   Oncology  ID  Palliative Care   Procedures: None   Antimicrobials:  Anti-infectives (From admission, onward)   Start     Dose/Rate Route Frequency Ordered Stop   09/05/18 2200  vancomycin (VANCOCIN) IVPB 1000 mg/200 mL premix     1,000 mg 200 mL/hr over 60 Minutes Intravenous Every 12 hours 09/05/18 1353     09/01/18 1800  sulfamethoxazole-trimethoprim (BACTRIM) 420 mg in  dextrose 5 % 500 mL IVPB     420 mg 350.8 mL/hr over 90 Minutes Intravenous Every 8 hours 09/01/18 1529     09/01/18 1000  fluconazole (DIFLUCAN) IVPB 400 mg     400 mg 100 mL/hr over 120 Minutes Intravenous Every 24 hours 09/01/18 0838     08/31/18 2200  vancomycin (VANCOCIN) 1,250 mg in sodium chloride 0.9 % 250 mL IVPB  Status:  Discontinued     1,250 mg 166.7 mL/hr over 90 Minutes Intravenous Every 12 hours 08/31/18 0852 09/05/18 1353   08/31/18 1000  meropenem (MERREM) 1 g in sodium chloride 0.9 % 100 mL IVPB  Status:  Discontinued     1 g 200 mL/hr over 30 Minutes Intravenous Every 8 hours 08/31/18 0848 09/01/18 1546   08/31/18 1000  vancomycin (VANCOCIN) 1,750 mg in sodium chloride 0.9 % 500 mL IVPB     1,750 mg 250 mL/hr over 120 Minutes Intravenous  Once 08/31/18 0852 08/31/18 1146   08/29/18 1400  levofloxacin (LEVAQUIN) IVPB 750 mg     750 mg 100 mL/hr over 90 Minutes Intravenous Every 24 hours 08/29/18 1353     08/28/18 2000  vancomycin (VANCOCIN) 1,250 mg in sodium chloride 0.9 %  250 mL IVPB  Status:  Discontinued     1,250 mg 166.7 mL/hr over 90 Minutes Intravenous Every 12 hours 08/28/18 1725 08/30/18 0853   08/28/18 1500  piperacillin-tazobactam (ZOSYN) IVPB 3.375 g  Status:  Discontinued     3.375 g 12.5 mL/hr over 240 Minutes Intravenous Every 8 hours 08/28/18 1245 08/31/18 0844   08/28/18 0900  vancomycin (VANCOCIN) 1,500 mg in sodium chloride 0.9 % 500 mL IVPB     1,500 mg 250 mL/hr over 120 Minutes Intravenous  Once 08/28/18 0854 08/28/18 1218   08/28/18 0900  piperacillin-tazobactam (ZOSYN) IVPB 3.375 g     3.375 g 100 mL/hr over 30 Minutes Intravenous  Once 08/28/18 0854 08/28/18 0935     Subjective: Seen and Examined and was more sleepy however I was able to wean him off BiPAP he tolerated that well.  Family was able to feed him him some broth afterwards and SLP evaluated and recommending continuing clear liquids for now and MBS in a.m.  Nutritionist was also  consulted.  Patient still and acute respiratory distress however was able to be weaned off of BiPAP and will see if he maintained his saturations.  As he feels okay but appears extremely tired.  Objective: Vitals:   09/08/18 0730 09/08/18 0800 09/08/18 1200 09/08/18 1600  BP:  (!) 100/59 112/70 107/63  Pulse: 100 93 84 88  Resp: (!) 42 (!) 22 20 19   Temp:  97.9 F (36.6 C)  (!) 97.3 F (36.3 C)  TempSrc:  Axillary  Oral  SpO2: 94% 94% 97% 97%  Weight:      Height:        Intake/Output Summary (Last 24 hours) at 09/08/2018 1718 Last data filed at 09/08/2018 1520 Gross per 24 hour  Intake 2540.67 ml  Output 3070 ml  Net -529.33 ml   Filed Weights   08/28/18 1400  Weight: 84.2 kg   Examination: Physical Exam:  Constitutional: Well-nourished, well-developed obese Caucasian male is chronically ill-appearing and appears very fatigued wearing his BiPAP being uncomfortable. Eyes: Lids and conjunctive are normal.  Sclera anicteric ENMT: Nose appear normal.  Grossly normal hearing Neck: Neck appears supple with no JVD Respiratory: Definitely diminished air entry with diminished breath sounds bibasilarly with some rales but no real appreciable wheezing, crackles, rhonchi.  Patient was on BiPAP this morning and however I transitioned him to heated high flow nasal cannula and he was maintaining saturations with mildly labored breathing Cardiovascular: Slightly tachycardic rate but no appreciable murmurs, rubs, gallops.  S1-S2 auscultated Abdomen: Soft, nontender, nondistended.  Bowel sounds present all 4 quadrants GU: Deferred Musculoskeletal: No clubbing or cyanosis.  No joint deformity noted Skin: Skin is warm and dry.  With no appreciable rashes or lesions on limited skin evaluation Neurologic: Cranial nerves II through 12 grossly intact with no appreciable focal deficits Psychiatric: Normal judgment intact.  Patient is awake, alert and oriented x3.  However he is very significantly  fatigued and does appear withdrawn and depressed with a flat affect  Data Reviewed: I have personally reviewed following labs and imaging studies  CBC: Recent Labs  Lab 09/04/18 0323 09/05/18 0440 09/06/18 0443 09/07/18 0430 09/08/18 0348  WBC 6.5 7.9 9.2 11.4* 11.5*  NEUTROABS 6.0 7.4 8.6* 11.4* 11.1*  HGB 7.0* 7.7* 7.9* 8.4* 8.4*  HCT 21.5* 23.2* 23.6* 25.6* 25.5*  MCV 91.1 91.3 91.8 93.4 93.1  PLT 80* 78* 71* 69* 66*   Basic Metabolic Panel: Recent Labs  Lab 09/03/18 0336 09/04/18  6812 09/05/18 0440 09/06/18 0443 09/07/18 0430 09/08/18 0348  NA 130* 128* 131* 128* 131* 127*  K 4.6 4.7 4.8 4.1 4.4 4.8  CL 94* 94* 96* 93* 96* 92*  CO2 24 24 22 22 23 23   GLUCOSE 294* 289* 271* 231* 186* 145*  BUN 14 15 16 20 21 19   CREATININE 0.40* 0.36* 0.41* 0.41* 0.46* 0.43*  CALCIUM 7.9* 7.8* 7.8* 7.8* 8.2* 7.9*  MG 2.6* 2.5*  --  2.4 2.5* 2.5*  PHOS  --   --   --  2.8 3.5 3.4   GFR: Estimated Creatinine Clearance: 112.7 mL/min (A) (by C-G formula based on SCr of 0.43 mg/dL (L)). Liver Function Tests: Recent Labs  Lab 09/06/18 0443 09/07/18 0430 09/08/18 0348  AST 28 30 26   ALT 26 27 24   ALKPHOS 183* 196* 176*  BILITOT 0.3 0.4 0.2*  PROT 4.9* 5.0* 4.9*  ALBUMIN 1.6* 1.9* 1.8*   No results for input(s): LIPASE, AMYLASE in the last 168 hours. No results for input(s): AMMONIA in the last 168 hours. Coagulation Profile: No results for input(s): INR, PROTIME in the last 168 hours. Cardiac Enzymes: No results for input(s): CKTOTAL, CKMB, CKMBINDEX, TROPONINI in the last 168 hours. BNP (last 3 results) No results for input(s): PROBNP in the last 8760 hours. HbA1C: No results for input(s): HGBA1C in the last 72 hours. CBG: Recent Labs  Lab 09/07/18 2310 09/08/18 0344 09/08/18 0828 09/08/18 1347 09/08/18 1548  GLUCAP 254* 159* 146* 188* 251*   Lipid Profile: No results for input(s): CHOL, HDL, LDLCALC, TRIG, CHOLHDL, LDLDIRECT in the last 72 hours. Thyroid  Function Tests: No results for input(s): TSH, T4TOTAL, FREET4, T3FREE, THYROIDAB in the last 72 hours. Anemia Panel: No results for input(s): VITAMINB12, FOLATE, FERRITIN, TIBC, IRON, RETICCTPCT in the last 72 hours. Sepsis Labs: No results for input(s): PROCALCITON, LATICACIDVEN in the last 168 hours.  Recent Results (from the past 240 hour(s))  Respiratory Panel by PCR     Status: None   Collection Time: 09/01/18 11:46 AM  Result Value Ref Range Status   Adenovirus NOT DETECTED NOT DETECTED Final   Coronavirus 229E NOT DETECTED NOT DETECTED Final   Coronavirus HKU1 NOT DETECTED NOT DETECTED Final   Coronavirus NL63 NOT DETECTED NOT DETECTED Final   Coronavirus OC43 NOT DETECTED NOT DETECTED Final   Metapneumovirus NOT DETECTED NOT DETECTED Final   Rhinovirus / Enterovirus NOT DETECTED NOT DETECTED Final   Influenza A NOT DETECTED NOT DETECTED Final   Influenza B NOT DETECTED NOT DETECTED Final   Parainfluenza Virus 1 NOT DETECTED NOT DETECTED Final   Parainfluenza Virus 2 NOT DETECTED NOT DETECTED Final   Parainfluenza Virus 3 NOT DETECTED NOT DETECTED Final   Parainfluenza Virus 4 NOT DETECTED NOT DETECTED Final   Respiratory Syncytial Virus NOT DETECTED NOT DETECTED Final   Bordetella pertussis NOT DETECTED NOT DETECTED Final   Chlamydophila pneumoniae NOT DETECTED NOT DETECTED Final   Mycoplasma pneumoniae NOT DETECTED NOT DETECTED Final    Comment: Performed at [facility], [address], [place], [place] [address]    Radiology Studies: Dg Chest Port 1 View  Result Date: 09/08/2018 CLINICAL DATA:  Shortness of breath EXAM: PORTABLE CHEST 1 VIEW COMPARISON:  September 07, 2018 FINDINGS: Port-A-Cath tip is at the cavoatrial junction. No pneumothorax. There is patchy airspace opacity throughout the right lower lobe as well as to a lesser extent in the left lower lobe. Heart is upper normal in size with pulmonary  vascularity normal. No adenopathy. There is aortic  atherosclerosis. No bone lesions. IMPRESSION: Central catheter as described without pneumothorax. Patchy infiltrate in both lower lobes, more on the right than on the left, essentially stable. Stable cardiac silhouette. There is aortic atherosclerosis. Aortic Atherosclerosis (ICD10-I70.0). Electronically Signed   By: Lowella Grip III M.D.   On: 09/08/2018 07:04   Dg Chest Port 1 View  Result Date: 09/07/2018 CLINICAL DATA:  The shortness of breath EXAM: PORTABLE CHEST 1 VIEW COMPARISON:  09/06/2018 FINDINGS: Cardiac shadow is stable. Right-sided chest wall port is again noted and stable. Patchy infiltrates are again identified in both lungs although mildly improved in the bases bilaterally. No bony abnormality is noted. IMPRESSION: Bilateral infiltrates with very mild improvement when compare with the previous day. Electronically Signed   By: Inez Catalina M.D.   On: 09/07/2018 07:03   Scheduled Meds: . bisacodyl  10 mg Rectal Once  . chlorhexidine  15 mL Mouth Rinse BID  . Chlorhexidine Gluconate Cloth  6 each Topical Daily  . furosemide  40 mg Intravenous Daily  . gabapentin  300 mg Oral TID  . insulin aspart  0-15 Units Subcutaneous Q4H  . insulin glargine  33 Units Subcutaneous QHS  . LORazepam  0.5 mg Intravenous 2 times per day  . LORazepam  1 mg Intravenous QHS  . mouth rinse  15 mL Mouth Rinse q12n4p  . methylPREDNISolone (SOLU-MEDROL) injection  60 mg Intravenous Q8H  . morphine  15 mg Oral Daily  . multivitamin with minerals  1 tablet Oral Daily  . nystatin  5 mL Oral QID  . pantoprazole (PROTONIX) IV  40 mg Intravenous Q12H  . polyvinyl alcohol  1 drop Both Eyes TID  . protein supplement shake  11 oz Oral TID BM  . sodium chloride flush  3 mL Intravenous Q12H   Continuous Infusions: . sodium chloride Stopped (08/30/18 0808)  . sodium chloride Stopped (09/08/18 1109)  . fluconazole (DIFLUCAN) IV Stopped (09/08/18 1309)  . levofloxacin (LEVAQUIN) IV Stopped (09/08/18 1530)    . sulfamethoxazole-trimethoprim Stopped (09/08/18 1519)  . vancomycin Stopped (09/08/18 1047)    LOS: 11 days   Kerney Elbe, DO [facility] PAGER is on [place]  If 7PM-7AM, please contact night-coverage [email or web address] Password [email or web address] 09/08/2018, 5:18 PM

## 2018-09-08 NOTE — Progress Notes (Signed)
Patient ID: Allen Glenn, male   DOB: 1956/03/26, 62 y.o.   MRN: [id number]          [facility] Infectious [facility]    Date of Admission:  08/28/2018    Day 12 vancomycin                                                                                     Day 11 levofloxacin                                                                                     Day 8 fluconazole                                                                                     Day 7 trimethoprim sulfamethoxazole  [name] remains on broad empiric therapy for healthcare associated pneumonia.  We have very little data available to help determine the possible cause.  His chest x-ray has improved over the last week but, unfortunately this has not translated into significant clinical improvement.  He remains BiPAP dependent.  If he and his family continue with full care I will complete 14 days of vancomycin and levofloxacin and 10 days of fluconazole.  Continue trimethoprim sulfamethoxazole for a longer course for possible pneumocystis pneumonia.         Allen Bickers, MD [facility] for [facility] [phone] pager   [phone] cell 09/08/2018, 2:54 PM

## 2018-09-08 NOTE — Evaluation (Signed)
Clinical/Bedside Swallow Evaluation Patient Details  Name: Allen Glenn MRN: [id number] Date of Birth: June 01, 1956  Today's Date: 09/08/2018 Time: SLP Start Time (ACUTE ONLY): 1546 SLP Stop Time (ACUTE ONLY): 1616 SLP Time Calculation (min) (ACUTE ONLY): 30 min  Past Medical History:  Past Medical History:  Diagnosis Date  . Acute MI ([place]) 2014   acute ST elevation MI  . Anemia   . Colon cancer ([place]) 2016   Status post resection of colon mass is well as he panic metastasis.   . Coronary artery disease   . Diabetes mellitus without complication ([place])   . Enlarged prostate    slightly and takes Flomax daily  . Heart disease   . History of blood transfusion   . Hyperlipidemia    takes Lipitor daily  . Hypertension    takes Lisinopril daily  . Lung nodule    left  . Nocturia   . Numbness    left foot  . Pancreatitis   . Peripheral neuropathy    Past Surgical History:  Past Surgical History:  Procedure Laterality Date  . 1/8 of liver removed    . APPENDECTOMY    . APPLICATION OF CRANIAL NAVIGATION Left 06/04/2018   Procedure: APPLICATION OF CRANIAL NAVIGATION;  Surgeon: Erline Levine, MD;  Location: [place];  Service: Neurosurgery;  Laterality: Left;  . BIOPSY  05/22/2018   Procedure: BIOPSY;  Surgeon: Carol Ada, MD;  Location: [facility];  Service: Endoscopy;;  . BRAIN SURGERY  06/04/2018  . COLONOSCOPY    . CORONARY ANGIOPLASTY     1 stent  . CORONARY STENT PLACEMENT  06-27-2013  . CRANIOTOMY Left 06/04/2018   Procedure: CRANIOTOMY TUMOR EXCISION with Brainlab;  Surgeon: Erline Levine, MD;  Location: [place];  Service: Neurosurgery;  Laterality: Left;  CRANIOTOMY TUMOR EXCISION with Brainlab  . ESOPHAGOGASTRODUODENOSCOPY Left 05/22/2018   Procedure: ESOPHAGOGASTRODUODENOSCOPY (EGD);  Surgeon: Carol Ada, MD;  Location: [facility];  Service: Endoscopy;  Laterality: Left;  . HERNIA REPAIR Left 1991  . IR IMAGING GUIDED PORT INSERTION  07/18/2018  . LAPAROSCOPIC RIGHT  COLECTOMY  2016   [place]  . LIVER LOBECTOMY Right 09/28/2015    Right partial hep[facility] at [facility]  . LUNG BIOPSY Left 11/28/2016   Procedure: LUNG BIOPSY, left upper lobe;  Surgeon: [name], MD;  Location: [place];  Service: Thoracic;  Laterality: Left;  Marland Kitchen VIDEO BRONCHOSCOPY WITH ENDOBRONCHIAL NAVIGATION N/A 11/28/2016   Procedure: VIDEO BRONCHOSCOPY WITH ENDOBRONCHIAL NAVIGATION;  Surgeon: [name], MD;  Location: [place];  Service: Thoracic;  Laterality: N/A;  . VIDEO BRONCHOSCOPY WITH ENDOBRONCHIAL ULTRASOUND N/A 11/28/2016   Procedure: VIDEO BRONCHOSCOPY WITH ENDOBRONCHIAL ULTRASOUND;  Surgeon: [name], MD;  Location: [facility];  Service: Thoracic;  Laterality: N/A;   HPI:  62 yo male adm to [facility] with hypoxia, increased lactic acid, febrile. 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  .  Pt has required Bipap on and off but currently is on 8 L HFNC.  He desires to eat.  concern for possible ARDS noted - pt with severe pna - right more than left patchy infiltrate.  WBC increasing from 5.7 to 11.5.  He has been made a DNR during this hospital coarse.   Pt's son and daughter present during eval and state pt wants to eat.     Assessment / Plan / Recommendation Clinical Impression  Pt seen with  minimal intake due to his lethargy and weakness.  No indication of airway compromise with straw boluses of thin, protein drink nor applesauce.  However given pt has bone mets, I can not rule out dysphagia/pharyngeal residuals/aspiration of silent nature.    Pt and children educated to concerns and inquired regarding their goals - continuing treatment is the plan.  If pt aspirated solid foods, increased pulmonary risk present and pt/family educated to this.  Therefore pt will require an instrumental evaluation to allow viewing of pharyngeal/laryngeal musculature.    Pt, son and daughter agreeable to MBS.   Planned for 09/09/18 coordinating with RT (Megan-thank you).  Pt, family, RT, Xray and RN made aware and agreeable.  Regardless of findings of MBS, pt will likely not be able to consume adequate po for nutritional support due to his level of fatigue/respiratory compromise.     SLP Visit Diagnosis: Dysphagia, unspecified (R13.10)    Aspiration Risk  Severe aspiration risk;Risk for inadequate nutrition/hydration    Diet Recommendation (clear liquids - small boluses)   Liquid Administration via: Straw;Spoon;Cup Medication Administration: Via alternative means Supervision: Full supervision/cueing for compensatory strategies Compensations: Slow rate;Small sips/bites;Other (Comment)(small single sips, assure pt swallows before giving more) Postural Changes: Seated upright at 90 degrees;Remain upright for at least 30 minutes after po intake(as much as able)    Other  Recommendations Oral Care Recommendations: Oral care BID   Follow up Recommendations None(tbd but doubtful indicated)      Frequency and Duration   tbd         Prognosis   poor     Swallow Study   General Date of Onset: 09/08/18 HPI: 62 yo male adm to [facility] with hypoxia, increased lactic acid, febrile. 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  .  Pt has required Bipap on and off but currently is on 8 L HFNC.  He desires to eat.  concern for possible ARDS noted - pt with severe pna - right more than left patchy infiltrate.  WBC increasing from 5.7 to 11.5.  He has been made a DNR during this hospital coarse.   Pt's son and daughter present during eval and state pt wants to eat.   Type of Study: Bedside Swallow Evaluation Previous Swallow Assessment: none Diet Prior to this Study: (clear liquids) Temperature Spikes Noted: No Respiratory Status: Nasal cannula(requiring bipap off and on) History of Recent Intubation: No Behavior/Cognition:  Lethargic/Drowsy;Cooperative;Pleasant mood Oral Cavity Assessment: Other (comment)(black coating on tongue, white/black coating posteiror oral cavity, ? oral candidiasis, pt is on tx) Oral Care Completed by SLP: (assessed and not needed) Oral Cavity - Dentition: Adequate natural dentition Vision: Functional for self-feeding Self-Feeding Abilities: Total assist(pt is weak, can not raise arms- has to be fed) Patient Positioning: Upright in bed Baseline Vocal Quality: Breathy;Low vocal intensity Volitional Cough: Weak Volitional Swallow: Unable to elicit    Oral/Motor/Sensory Function Overall Oral Motor/Sensory Function: Generalized oral weakness(gross weakness)   Ice Chips Ice chips: Not tested   Thin Liquid Presentation: Straw Pharyngeal  Phase Impairments: Multiple swallows;Suspected delayed Swallow    Nectar Thick Nectar Thick Liquid: Within functional limits Presentation: Straw Pharyngeal Phase Impairments: Multiple swallows;Suspected delayed Swallow   Honey Thick Honey Thick Liquid: Not tested   Puree Puree: Within functional limits Presentation: Spoon Oral Phase Functional Implications: Prolonged oral transit Pharyngeal Phase Impairments: Suspected delayed Swallow   Solid     Solid: Not  tested Other Comments: due to pt's gross weakness, aspiration risk      Macario Golds 09/08/2018,5:04 PM   Luanna Salk, [facility] SLP [place] Pager [phone] Office [phone]

## 2018-09-08 NOTE — Progress Notes (Signed)
Pharmacy Antibiotic Note  Allen Glenn is a 62 y.o. male with metastatic colon cancer undergoing chemotherapy treatment, presented to the ED on with sepsis. ID currently seeing patient.  He's on vancomycin, levaquin, diflucan and bactrim for broad coverage.  Today, 09/08/2018: - day #12 vancomycin, day #11 levaquin, day#8 diflucan, day #4 bactrim  - afebrile, wbc 11.5 (steroids), scr stable at 0.43 - ID to continue abx through the weekend  Plan:  Fluconazole 400 mg q24h  Levaquin 750 mg IV q24h  Sulfamethoxazole-trimethoprim 420 mg (~ 15 mg/kg/day) IV q8h  Vancomycin 1 gm IV q12  Check vancomycin levels if remains on vancomycin > 3-4 days.  Goal AUC 400-500. Follow up renal fxn, culture results, and clinical course.  F/u ID plans for antibiotic treatment  __________________________________ Height: 6\' 2"  (188 cm) Weight: 185 lb 10 oz (84.2 kg) IBW/kg (Calculated) : 82.2  Temp (24hrs), Avg:97.4 F (36.3 C), Min:96.7 F (35.9 C), Max:98 F (36.7 C)  Recent Labs  Lab 09/04/18 0323 09/05/18 0440 09/05/18 0926 09/05/18 1300 09/06/18 0443 09/07/18 0430 09/08/18 0348  WBC 6.5 7.9  --   --  9.2 11.4* 11.5*  CREATININE 0.36* 0.41*  --   --  0.41* 0.46* 0.43*  VANCOTROUGH  --   --  15  --   --   --   --   VANCOPEAK  --   --   --  37  --   --   --     Estimated Creatinine Clearance: 112.7 mL/min (A) (by C-G formula based on SCr of 0.43 mg/dL (L)).    No Known Allergies   Antimicrobials this admission: 9/5 Vancomycin >> 9/7, resumed 9/8 >> 9/5 Zosyn >> 9/8 9/6 Levofloxacin >>  9/8 Meropenem >> 9/9 9/9 Diflucan >> 9/9 Bactrim >>   Dose adjustments this admission: 9/13 VT at 0926=15         Vpk at 1300 = 37 Ke 0.1062, t/12 6.5 hr, Cmax 43.4, Cmin 14.4. Vd 37 L, CL 65.4. Change to Vanc 1 gm q12 for AUC 509, Cmax 35.6 Cmin 11.1   Microbiology results: 9/4 BCx (at [facility]): NGF 9/5 MRSA PCR: negative  9/5 BCx x2: NGF 9/6 Urine legionella Ag: negative 9/6 Urine strep  pneumo Ag: neg 9/9 Respiratory virus panel: negative  Thank you for allowing pharmacy to be a part of this patient's care.  Gretta Arab PharmD, BCPS Pager [phone] 09/08/2018 11:44 AM

## 2018-09-09 ENCOUNTER — Ambulatory Visit: Payer: 59

## 2018-09-09 ENCOUNTER — Inpatient Hospital Stay (HOSPITAL_COMMUNITY): Payer: 59

## 2018-09-09 ENCOUNTER — Ambulatory Visit: Payer: 59 | Admitting: Radiation Oncology

## 2018-09-09 ENCOUNTER — Other Ambulatory Visit: Payer: 59

## 2018-09-09 DIAGNOSIS — J9600 Acute respiratory failure, unspecified whether with hypoxia or hypercapnia: Secondary | ICD-10-CM

## 2018-09-09 DIAGNOSIS — D6959 Other secondary thrombocytopenia: Secondary | ICD-10-CM

## 2018-09-09 LAB — GLUCOSE, CAPILLARY
GLUCOSE-CAPILLARY: 146 mg/dL — AB (ref 70–99)
GLUCOSE-CAPILLARY: 166 mg/dL — AB (ref 70–99)
GLUCOSE-CAPILLARY: 179 mg/dL — AB (ref 70–99)
GLUCOSE-CAPILLARY: 297 mg/dL — AB (ref 70–99)
Glucose-Capillary: 245 mg/dL — ABNORMAL HIGH (ref 70–99)
Glucose-Capillary: 257 mg/dL — ABNORMAL HIGH (ref 70–99)
Glucose-Capillary: 263 mg/dL — ABNORMAL HIGH (ref 70–99)

## 2018-09-09 LAB — COMPREHENSIVE METABOLIC PANEL
ALBUMIN: 2.1 g/dL — AB (ref 3.5–5.0)
ALT: 25 U/L (ref 0–44)
ANION GAP: 12 (ref 5–15)
AST: 28 U/L (ref 15–41)
Alkaline Phosphatase: 187 U/L — ABNORMAL HIGH (ref 38–126)
BILIRUBIN TOTAL: 0.2 mg/dL — AB (ref 0.3–1.2)
BUN: 23 mg/dL (ref 8–23)
CALCIUM: 8.1 mg/dL — AB (ref 8.9–10.3)
CO2: 23 mmol/L (ref 22–32)
Chloride: 93 mmol/L — ABNORMAL LOW (ref 98–111)
Creatinine, Ser: 0.42 mg/dL — ABNORMAL LOW (ref 0.61–1.24)
GFR calc Af Amer: >60 mL/min (ref >60)
GFR calc non Af Amer: >60 mL/min (ref >60)
GLUCOSE: 160 mg/dL — AB (ref 70–99)
Potassium: 4.2 mmol/L (ref 3.5–5.1)
SODIUM: 128 mmol/L — AB (ref 135–145)
Total Protein: 4.9 g/dL — ABNORMAL LOW (ref 6.5–8.1)

## 2018-09-09 LAB — MAGNESIUM: Magnesium: 2.6 mg/dL — ABNORMAL HIGH (ref 1.7–2.4)

## 2018-09-09 LAB — PHOSPHORUS: Phosphorus: 3.7 mg/dL (ref 2.5–4.6)

## 2018-09-09 MED ORDER — ADULT MULTIVITAMIN LIQUID CH
15.0000 mL | Freq: Every day | ORAL | Status: DC
  Filled 2018-09-09: qty 15

## 2018-09-09 MED ORDER — ADULT MULTIVITAMIN LIQUID CH
15.0000 mL | Freq: Every day | ORAL | Status: DC
  Administered 2018-09-10 – 2018-09-18 (×9): 15 mL via ORAL
  Filled 2018-09-09 (×10): qty 15

## 2018-09-09 MED ORDER — METHYLPREDNISOLONE SODIUM SUCC 125 MG IJ SOLR
60.0000 mg | Freq: Two times a day (BID) | INTRAMUSCULAR | Status: DC
  Administered 2018-09-10 – 2018-09-12 (×5): 60 mg via INTRAVENOUS
  Filled 2018-09-09 (×5): qty 2

## 2018-09-09 NOTE — Progress Notes (Signed)
IP PROGRESS NOTE  Subjective:   Mr. Dorner is now off of BiPAP.  The oxygen has been weaned to an 8 L nasal cannula.  He appears comfortable.  Mr. Duffus reports feeling better.  Ms. Cupples is at the bedside.  Objective: Vital signs in last 24 hours: Blood pressure 101/67, pulse 94, temperature (!) 96.6 F (35.9 C), temperature source Axillary, resp. rate 19, height _0  (1.88 m), weight 189 lb 13.1 oz (86.1 kg), SpO2 94 %.  Intake/Output from previous day: 09/16 0701 - 09/17 0700 In: 2545.1 [P.O.:40; I.V.:4.2; IV Piggyback:2500.9] Out: 3700 [Urine:3700]  Physical Exam:  HEENT: No thrush Lungs: Lungs clear anteriorly Cardiac: Regular rate and rhythm Abdomen: Nontender, no hepatosplenomegaly Extremities: No leg edema Neurologic: Raechel Chute, arousable, follows commands  Portacath/PICC-without erythema  Lab Results: Recent Labs    09/08/18 0348 09/09/18 0330  WBC 11.5* 11.8*  HGB 8.4* 9.0*  HCT 25.5* 27.1*  PLT 66* 72*   BMET Recent Labs    09/08/18 0348 09/09/18 0330  NA 127* 128*  K 4.8 4.2  CL 92* 93*  CO2 23 23  GLUCOSE 145* 160*  BUN 19 23  CREATININE 0.43* 0.42*  CALCIUM 7.9* 8.1*    Lab Results  Component Value Date   CEA1 71.41 (H) 08/19/2018    Studies/Results: Dg Chest Port 1 View  Result Date: 09/09/2018 CLINICAL DATA:  Shortness of breath. EXAM: PORTABLE CHEST 1 VIEW COMPARISON:  Radiograph of September 08, 2018. FINDINGS: The heart size and mediastinal contours are within normal limits. No pneumothorax or pleural effusion is noted. Right internal jugular Port-A-Cath is noted with distal tip in expected position of cavoatrial junction. Stable right basilar atelectasis or infiltrate is noted. Stable left lingular subsegmental atelectasis or infiltrate is noted. The visualized skeletal structures are unremarkable. IMPRESSION: Stable bilateral lung opacities as described above. Electronically Signed   By: Marijo Conception, M.D.   On: 09/09/2018 07:47   Dg  Chest Port 1 View  Result Date: 09/08/2018 CLINICAL DATA:  Shortness of breath EXAM: PORTABLE CHEST 1 VIEW COMPARISON:  September 07, 2018 FINDINGS: Port-A-Cath tip is at the cavoatrial junction. No pneumothorax. There is patchy airspace opacity throughout the right lower lobe as well as to a lesser extent in the left lower lobe. Heart is upper normal in size with pulmonary vascularity normal. No adenopathy. There is aortic atherosclerosis. No bone lesions. IMPRESSION: Central catheter as described without pneumothorax. Patchy infiltrate in both lower lobes, more on the right than on the left, essentially stable. Stable cardiac silhouette. There is aortic atherosclerosis. Aortic Atherosclerosis (ICD10-I70.0). Electronically Signed   By: Lowella Grip III M.D.   On: 09/08/2018 07:04   Dg Swallowing Func-speech Pathology  Result Date: 09/09/2018 Objective Swallowing Evaluation: Type of Study: MBS-Modified Barium Swallow Study  Patient Details Name: DAMONDRE PFEIFLE MRN: [id number] Date of Birth: Aug 29, 1956 Today's Date: 09/09/2018 Time: SLP Start Time (ACUTE ONLY): 0840 -SLP Stop Time (ACUTE ONLY): 0909 SLP Time Calculation (min) (ACUTE ONLY): 29 min Past Medical History: Past Medical History: Diagnosis Date . Acute MI ([place]) 2014  acute ST elevation MI . Anemia  . Colon cancer ([place]) 2016  Status post resection of colon mass is well as he panic metastasis.  . Coronary artery disease  . Diabetes mellitus without complication ([place])  . Enlarged prostate   slightly and takes Flomax daily . Heart disease  . History of blood transfusion  . Hyperlipidemia   takes Lipitor daily . Hypertension   takes Lisinopril  daily . Lung nodule   left . Nocturia  . Numbness   left foot . Pancreatitis  . Peripheral neuropathy  Past Surgical History: Past Surgical History: Procedure Laterality Date . 1/8 of liver removed   . APPENDECTOMY   . APPLICATION OF CRANIAL NAVIGATION Left 0/17/4944  Procedure: APPLICATION OF CRANIAL NAVIGATION;   Surgeon: Erline Levine, MD;  Location: [facility];  Service: Neurosurgery;  Laterality: Left; . BIOPSY  05/22/2018  Procedure: BIOPSY;  Surgeon: Carol Ada, MD;  Location: [facility];  Service: Endoscopy;; . BRAIN SURGERY  06/04/2018 . COLONOSCOPY   . CORONARY ANGIOPLASTY    1 stent . CORONARY STENT PLACEMENT  06-27-2013 . CRANIOTOMY Left 06/04/2018  Procedure: CRANIOTOMY TUMOR EXCISION with Brainlab;  Surgeon: Erline Levine, MD;  Location: [facility];  Service: Neurosurgery;  Laterality: Left;  CRANIOTOMY TUMOR EXCISION with Brainlab . ESOPHAGOGASTRODUODENOSCOPY Left 05/22/2018  Procedure: ESOPHAGOGASTRODUODENOSCOPY (EGD);  Surgeon: Carol Ada, MD;  Location: [facility];  Service: Endoscopy;  Laterality: Left; . HERNIA REPAIR Left 1991 . IR IMAGING GUIDED PORT INSERTION  07/18/2018 . LAPAROSCOPIC RIGHT COLECTOMY  2016  [place] . LIVER LOBECTOMY Right 09/28/2015   Right partial [facility] at [facility] . LUNG BIOPSY Left 11/28/2016  Procedure: LUNG BIOPSY, left upper lobe;  Surgeon: [name], MD;  Location: [facility];  Service: Thoracic;  Laterality: Left; Marland Kitchen VIDEO BRONCHOSCOPY WITH ENDOBRONCHIAL NAVIGATION N/A 11/28/2016  Procedure: VIDEO BRONCHOSCOPY WITH ENDOBRONCHIAL NAVIGATION;  Surgeon: [name], MD;  Location: [facility];  Service: Thoracic;  Laterality: N/A; . VIDEO BRONCHOSCOPY WITH ENDOBRONCHIAL ULTRASOUND N/A 11/28/2016  Procedure: VIDEO BRONCHOSCOPY WITH ENDOBRONCHIAL ULTRASOUND;  Surgeon: [name], MD;  Location: [facility];  Service: Thoracic;  Laterality: N/A; HPI: 62 yo male adm to [facility] with hypoxia, increased lactic acid, febrile. 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  .  Pt has required Bipap on and off but currently is on 8 L HFNC.  He desires to eat.  concern for possible ARDS noted - pt with severe pna - right more than left patchy infiltrate.  WBC increasing from 5.7 to 11.5.  He has been made a DNR  during this hospital coarse.   Pt's son and daughter present during eval and state pt wants to eat.   Subjective: pt awake in bed, sleepy but participative Assessment / Plan / Recommendation CH[facility] IP CLINICAL IMPRESSIONS 09/09/2018 Clinical Impression Pt presents with moderately severe oral and mild pharyngeal dysphagia.  Decreased oral bolus control/organization and transiting noted due to weakness.  Pt frequently spills boluses into pharynx poorly controlled and oral residuals present.  He intermittently conducts dry swallow to clear oral cavity- at times requiring verbal cue.  Pharyngeal swallow is strong when triggered.  Secretions noted in pharynx which pt does not sense= nor did he clear with intake.  SLP challenged pt during MBS by providing him with 3 boluses of applesauce to swallow to assess for fatigue.  At end of MBS, pt did present with minimal residuals in pharynx without awareness, cued dry swallow effective to clear.  Educated pt and wife to findings/recommendations and concerns for pt to not receive adequate nutrition due to current level of dysphagia.  Although he did not aspirate on this MBS, he will continue to be high risk due to oral deficits and gross weakness.  Dys1 (extra creamy) and thin would be least restrictive diet for him with strict precautions.  Educated pt/wife  to findings/recommendations and provided swallow signs to RN.  Will follow up briefly for tolerance.   SLP Visit Diagnosis Dysphagia, oropharyngeal phase (R13.12);Dysphagia, unspecified (R13.10) Attention and concentration deficit following -- Frontal lobe and executive function deficit following -- Impact on safety and function Moderate aspiration risk   CHL IP TREATMENT RECOMMENDATION 09/09/2018 Treatment Recommendations Therapy as outlined in treatment plan below   Prognosis 09/09/2018 Prognosis for Safe Diet Advancement Fair Barriers to Reach Goals Severity of deficits;Other (Comment) Barriers/Prognosis Comment -- CH[facility] IP DIET  RECOMMENDATION 09/09/2018 SLP Diet Recommendations Dysphagia 1 (Puree) solids;Thin liquid Liquid Administration via Cup;Straw Medication Administration Crushed with puree Compensations Minimize environmental distractions;Slow rate;Small sips/bites;Other (Comment) Postural Changes Remain semi-upright after after feeds/meals (Comment);Seated upright at 90 degrees   CHL IP OTHER RECOMMENDATIONS 09/09/2018 Recommended Consults -- Oral Care Recommendations (No Data) Other Recommendations Have oral suction available   CH[facility] IP FOLLOW UP RECOMMENDATIONS 09/09/2018 Follow up Recommendations (No Data)   CH[facility] IP FREQUENCY AND DURATION 09/09/2018 Speech Therapy Frequency (ACUTE ONLY) min 1 x/week Treatment Duration 1 week      CH[facility] IP ORAL PHASE 09/09/2018 Oral Phase Impaired Oral - Pudding Teaspoon -- Oral - Pudding Cup -- Oral - Honey Teaspoon -- Oral - Honey Cup -- Oral - Nectar Teaspoon -- Oral - Nectar Cup -- Oral - Nectar Straw Delayed oral transit;Decreased bolus cohesion;Lingual/palatal residue;Premature spillage;Weak lingual manipulation;Lingual pumping;Reduced posterior propulsion Oral - Thin Teaspoon Delayed oral transit;Decreased bolus cohesion;Lingual/palatal residue;Premature spillage;Weak lingual manipulation;Reduced posterior propulsion Oral - Thin Cup Delayed oral transit;Decreased bolus cohesion;Lingual/palatal residue;Premature spillage;Weak lingual manipulation;Lingual pumping;Reduced posterior propulsion Oral - Thin Straw Delayed oral transit;Decreased bolus cohesion;Lingual/palatal residue;Premature spillage;Weak lingual manipulation;Lingual pumping Oral - Puree Delayed oral transit;Premature spillage;Weak lingual manipulation;Lingual/palatal residue;NT;Reduced posterior propulsion Oral - Mech Soft Delayed oral transit;Premature spillage;Weak lingual manipulation;Lingual/palatal residue;Lingual pumping;Reduced posterior propulsion;Impaired mastication Oral - Regular -- Oral - Multi-Consistency -- Oral - Pill  Delayed oral transit;Premature spillage;Weak lingual manipulation;Lingual/palatal residue;Lingual pumping;Reduced posterior propulsion Oral Phase - Comment delayed 16 seconds to masticate a cracker bolus and transit, minimal mastication;  delay with puree 6-7 seconds oral transiting, premature spillage with poor control noted, oral residuals present without inconsistent awareness  CHL IP PHARYNGEAL PHASE 09/09/2018 Pharyngeal Phase Impaired Pharyngeal- Pudding Teaspoon -- Pharyngeal -- Pharyngeal- Pudding Cup -- Pharyngeal -- Pharyngeal- Honey Teaspoon -- Pharyngeal -- Pharyngeal- Honey Cup -- Pharyngeal -- Pharyngeal- Nectar Teaspoon -- Pharyngeal -- Pharyngeal- Nectar Cup -- Pharyngeal -- Pharyngeal- Nectar Straw WFL Pharyngeal -- Pharyngeal- Thin Teaspoon -- Pharyngeal -- Pharyngeal- Thin Cup WFL Pharyngeal -- Pharyngeal- Thin Straw WFL;Delayed swallow initiation-pyriform sinuses;Pharyngeal residue - valleculae;Pharyngeal residue - pyriform Pharyngeal -- Pharyngeal- Puree WFL Pharyngeal -- Pharyngeal- Mechanical Soft WFL Pharyngeal -- Pharyngeal- Regular -- Pharyngeal -- Pharyngeal- Multi-consistency -- Pharyngeal -- Pharyngeal- Pill WFL Pharyngeal -- Pharyngeal Comment --  CHL IP CERVICAL ESOPHAGEAL PHASE 09/09/2018 Cervical Esophageal Phase Impaired Pudding Teaspoon -- Pudding Cup -- Honey Teaspoon -- Honey Cup -- Nectar Teaspoon -- Nectar Cup -- Nectar Straw -- Thin Teaspoon -- Thin Cup -- Thin Straw -- Puree -- Mechanical Soft -- Regular -- Multi-consistency -- Pill -- Cervical Esophageal Comment upon esophageal sweep after barium tablet administered with pudding, pt appeared with stasis in esophagus WITHOUT awareness, liquid bolus helpful to clear Macario Golds 09/09/2018, 10:04 AM    Luanna Salk, MS [facility] SLP Acute Rehab Services Pager [phone] Office [phone]            Medications: I have reviewed the patient's current medications.  Assessment/Plan:  1. Stage IV (  pT3,pN2b,M1) sees  moderately differentiated adenocarcinoma of the right colon, status post a right colectomy 05/04/2015, Foundation 1 testing-MSI-stable, K-ras G12Cmutations. No BRAFmutation  Liver biopsy 05/04/2015-metastatic adenocarcinoma consistent with a colon primary ? Staging PET scan 06/08/2015-isolated segment 4A liver lesion ? Initiation of adjuvant FOLFOX 06/13/2015 ? Restaging CT 08/09/2015 revealed a slight decrease in a borderline ileocolic node, decrease in the hepatic dome metastasis, no new lesions ? Liver resection 09/28/2015-pathology consistent with metastatic colon cancer, negative margins ? Adjuvant FOLFOX resumed 11/08/2015, oxaliplatin eliminated beginning 11/22/2015 secondary to neuropathy. He completed adjuvant chemotherapy 02/16/2016 ? Restaging chest CT 11/08/2016, compared to 05/07/2016 revealed a new 9 mm left upper lobe nodule, stable 2.2 cm right hepatic lesion ? PET scan 11/19/2016 confirmed a hypermetabolic left upper lobe nodule, hypermetabolic left paratracheal and pericardiac lymph nodes, and hypermetabolism associated with the hypoattenuating lesion in the dome of the liver ? Status post EBUSbiopsies of the left lingula nodule and a level 4Lnode on 11/28/2016-no evidence of malignancy ? CT chest 02/11/2017-increase in size of the left pulmonary nodule and epicardial lymph node ? Status post SBRT 2 the left lung nodule and mediastinum completed 03/14/2017 ? CTs 07/01/2017-new 9 mm focus along the right liver capsule, stable left subcapsular liver lesion, radiation change at site of left upper lobe nodule, stable pericardiallymph node ? CTs 11/25/2017-new 5 mm lingular nodule, enlargement of capsular-based right liver lesion, new capsular based right liver lesion capsular lesion at the hepatic dome ? CTs 02/25/2018-enlargement of small lung nodules, liver lesions and small mesenteric lymph node ? CT abdomen/pelvis 05/15/2018-new lower lobe pulmonary nodules, enlargement of liver  lesions and right lower quadrant soft tissue nodules ? 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 Brain MRI 05/26/2018-3.5 similar left posterior fossa mass, 4 additional subcentimeter enhancing brain lesions-3 cerebellar, 1 left thalamic ? SRS to all 5 brain lesions 06/02/2018 ? Surgical excision of left cerebellar lesion 06/04/2018, adenocarcinoma consistent with colorectal primary ? MRI lumbar spine 06/18/2018-extensive metastases to the lumbar spine, sacrum, T12, and pelvis, pathologic fracture of L3 ? Initiation of radiation from T12-S2 06/30/2018, completed 07/11/2018 ? Cycle 1 FOLFIRI 08/19/2018 2. Coronary artery disease status post a myocardial infarction in 2014  3. Hypertension  4. Hyperlipidemia  5. Diabetes  6. Admission 05/20/2018 with nausea/vomitingsecondary to a cerebellar metastasis  7.Oral candidiasis 06/11/2018-started on Mycelex troches  8. 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  Admission 08/28/2018 with a febrile illness, likely pneumonia  10.  Anemia/thrombocytopenia secondary to chemotherapy and infection  11.  Respiratory failure secondary to pneumonia,?  ARDS-maintained on steroids and broad-spectrum antibiotics to improve   Mr. Mestre appears improved from a respiratory standpoint.  The oxygen is being weaned.  He is alert and appears comfortable this morning.  Hopefully he will experience continued improvement over the next few days and transition to the medical floor.  We can make a decision on placement versus home Hospice care if he continues to have clinical improvement.  I doubt he will be a candidate for further systemic therapy.   Recommendations: 1.  Continue antibiotics as recommended by Dr. Megan Salon 2.  Wean steroids and oxygen as tolerated 3.  Morphine as needed, consider stopping the scheduled Ativan since he has  improved from a respiratory standpoint    LOS: 12 days   Betsy Coder, MD   09/09/2018, 2:09 PM

## 2018-09-09 NOTE — Consult Note (Signed)
   [facility] CM Inpatient Consult   09/09/2018  Allen Glenn 01-18-1956 [id number]    Patient screened for potential [facility] Care Management telephonic follow up for [place] employees/dependents with [facility].   Chart reviewed. Noted Mr. Jenkinson is currently in stepdown unit.  Will continue to follow and engage when appropriate.    Marthenia Rolling, MSN-Ed, RN,BSN [facility] Liaison [phone]

## 2018-09-09 NOTE — Progress Notes (Signed)
Modified Barium Swallow Progress Note  Patient Details  Name: Allen Glenn MRN: [id number] Date of Birth: June 24, 1956  Today's Date: 09/09/2018  Modified Barium Swallow completed.  Full report located under Chart Review in the Imaging Section.  Brief recommendations include the following:  Clinical Impression  Pt presents with moderately severe oral and mild pharyngeal dysphagia.  Decreased oral bolus control/organization and transiting noted due to weakness.  Pt frequently spills boluses into pharynx poorly controlled and oral residuals present.  He intermittently conducts dry swallow to clear oral cavity- at times requiring verbal cue.    Pharyngeal swallow is strong when triggered.  Secretions noted in pharynx which pt does not sense= nor did he clear with intake.    SLP challenged pt during MBS by providing him with 3 boluses of applesauce to swallow to assess for fatigue.  At end of MBS, pt did present with minimal residuals in pharynx without awareness, cued dry swallow effective to clear.    Educated pt and wife to findings/recommendations and concerns for pt to not receive adequate nutrition due to current level of dysphagia.    Although he did not aspirate on this MBS, he will continue to be high risk due to oral deficits and gross weakness.  Dys1 (extra creamy) and thin would be least restrictive diet for him with strict precautions.    Educated pt/wife to findings/recommendations and provided swallow signs to RN.  Will follow up briefly for tolerance.      Will follow up briefly for tolerance.     Swallow Evaluation Recommendations       SLP Diet Recommendations: Dysphagia 1 (Puree) solids;Thin liquid   Liquid Administration via: Cup;Straw   Medication Administration: Crushed with puree   Supervision: Full assist for feeding;Full supervision/cueing for compensatory strategies   Compensations: Minimize environmental distractions;Slow rate;Small sips/bites;Other  (Comment)(delayed swallow, assure pt swallows before giving more!, drink liquids during meal to aid esophageal clearance)   Postural Changes: Remain semi-upright after after feeds/meals (Comment);Seated upright at 90 degrees   Oral Care Recommendations: (oral care after meals, oral suction prn)   Other Recommendations: Have oral suction available   Luanna Salk, MS [facility] SLP [place] Pager [phone] Office [phone]  Macario Golds 09/09/2018,10:05 AM

## 2018-09-09 NOTE — Progress Notes (Signed)
PROGRESS NOTE    Allen Glenn  ZOX:[id number] DOB: 1956-05-01 DOA: 08/28/2018 PCP: [name], MD  Brief Narrative:  Allen Glenn is a 62 year old male with past medical history of diabetes, CAD, hypertension and metastatic colon cancer on chemotherapy 8/27 who presented to the emergency department complaining of fever, fatigue and tachycardia.  Upon ED evaluation patient was found to be hypoxic, elevated lactic acid and febrile.  Patient was admitted with working diagnosis of sepsis of unknown origin and started on empiric antibiotics. Found to have severe bilateral pneumonia. Decompensated started on BiPAP, have developed a picture of ARDS however patient DNR/DNI. ID and PCCM consulted. He Continues to be on Continuous BiPAP but FIO2 currently weaning and has gone from 95% to 50% and his saturations are maintaining for now but today I was able to wean him to 11 Liters of HHFNC yesterday and he is on 8 Liters now. SLP evaluated as well and patient underwent MBS today and recommendation was for Dysphagia 1 Diet with Thin Liquids.   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  -CT chest performed 9/7 shows extensive opacities throughout both lungs.  Small bilateral pleural effusion and bilateral lung nodule compatible with metastatic disease.   -CXR this AM personally reviewed and shows mild improvement and essentially stable since yesterday  -Some improvement in oxygenation overnight,  -will continue Lasix daily 40 mg, monitor renal function closely.   -Transitioned off of BiPAP (earlier was 95%) and weaned to [facility] and is on 8 Liters now. Use BiPAP qHS and PRN -Continue current antibiotic therapy with Bactrim, vancomycin, Levaquin and Diflucan for now.  Dr. Megan Salon recommended waiting 14 days of vancomycin levofloxacin and 10 days of fluconazole and then continuing Bactrim for a longer course but now recommending stopping vancomycin on levofloxacin soon as they will complete the course.  Fluconazole has not been discontinued and Dr. Megan Salon recommended continuing Bactrim and steroids and continuing for at least a full 3 weeks -Continued Solu-Medrol 60 mg every 6 hours for possible pneumonitis and taperd to 60 mg q8h yesterday and will transition to 60 mg q12h tomorrow as patient is improving   -Continue Supportive treatment with Ativan and morphine for Pain and breathing; Scheduled Ativan this AM discontinued  -ID following.  Neutropenia improved. -Palliative Care for [place] and continue to discuss daily and will monitor daily  -Able to come off of BiPAP for MBS today and recommended Dysphagia 1 Diet with strict precautions as he is still at a very High Risk for Aspiration   Acute Respiratory Failure with Hypoxia, improving now off of NIPPV and on HHFNC;  -Multifactorial due to multifocal pneumonia, fluid overload and now with poss ARDS.   -  Patient and family decided on DNR/DNI.   -Patient remained dependent on BiPAP for 6 days and weaned FiO2 to heated high flow nasal cannula with 11 L and now is on 8 Liters  -Remained above 95% earlier but weaned to 50% and I transitioned him to hit high flow nasal cannula 09/08/18.  He maintained his  saturations and was able to stay off of BiPAP until the night and he was placed back on BiPAP nightly.  If unable to tolerate during the day will need to go back on BiPAP  -Palliative care recommendations appreciated and continuing to Monitor -Stayed 6 Days on Continuous BiPAP but thankfully I was able to wean him to heated high flow nasal cannula yesterday  Thrombocytopenia - stable  -In setting of chemotherapy and sepsis.  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 Management per Oncology  -[name] feel that clinical presentation is less likely related to Tumor Progress in the Lungs or Drug-Induced Pneumonitis  -Continuing Treatment as above  HTN -BP remains stable but on the lower side -Lisinopril discontinued . -BP was 111/67  Sinus Tachycardia, improving  -Tachycardia has improved some but likely related to Respiratory issues; felt to be related to sepsis and respiratory failure.   -Echo with no abnormalities.   -C/w IV metoprolol as needed for heart rate above 130 bpm's.  Type 2 DM   -Last A1c 7.9.  -CBGs now elevated in setting of high-dose steroids. -Patient n.p.o. will continue sliding scale for now but changed to Novolog 0-15 units q4H and increase lantus to 33 units qHS -Continue to monitor CBGs, suspect to improve with tapering steroids. -CBG's ranging from 146-245  Normocytic Anemia -Patient's hemoglobin/hematocrit slightly improved from yesterday and is now 9.0/27.1 -Continue monitor for signs and symptoms of bleeding -Repeat CBC in the a.m.  Hyponatremia -Patient's sodium was 128 This AM -Continue with IV Lasix daily -Repeat CMP in a.m.  Goals of cares DNR/DNI, continue to monitor over the next 24 hours and take it day by Day. Iimproved Respiratory wise today but very fatigued and able to be weaned off BiPAP; Continue to Monitor Daily as  respiratory status is tenuous   DVT prophylaxis: SCDs Code Status: DO NOT RESUSCITATE Family Communication: Discussed with Wife and Daughter and Son at bedside  Disposition Plan: Pending on clinical course   Consultants:   Oncology  ID  Palliative Care   Procedures: None   Antimicrobials:  Anti-infectives (From admission, onward)   Start     Dose/Rate Route Frequency Ordered Stop   09/05/18 2200  vancomycin (VANCOCIN) IVPB 1000 mg/200 mL premix     1,000 mg 200 mL/hr over 60 Minutes Intravenous Every 12 hours 09/05/18 1353     09/01/18 1800  sulfamethoxazole-trimethoprim (BACTRIM) 420 mg in dextrose 5 % 500 mL IVPB     420 mg 350.8 mL/hr over 90 Minutes Intravenous Every 8 hours 09/01/18 1529     09/01/18 1000  fluconazole (DIFLUCAN) IVPB 400 mg  Status:  Discontinued     400 mg 100 mL/hr over 120 Minutes Intravenous Every 24 hours 09/01/18 0838 09/09/18 1512   08/31/18 2200  vancomycin (VANCOCIN) 1,250 mg in sodium chloride 0.9 % 250 mL IVPB  Status:  Discontinued     1,250 mg 166.7 mL/hr over 90 Minutes Intravenous Every 12 hours 08/31/18 0852 09/05/18 1353   08/31/18 1000  meropenem (MERREM) 1 g  in sodium chloride 0.9 % 100 mL IVPB  Status:  Discontinued     1 g 200 mL/hr over 30 Minutes Intravenous Every 8 hours 08/31/18 0848 09/01/18 1546   08/31/18 1000  vancomycin (VANCOCIN) 1,750 mg in sodium chloride 0.9 % 500 mL IVPB     1,750 mg 250 mL/hr over 120 Minutes Intravenous  Once 08/31/18 0852 08/31/18 1146   08/29/18 1400  levofloxacin (LEVAQUIN) IVPB 750 mg     750 mg 100 mL/hr over 90 Minutes Intravenous Every 24 hours 08/29/18 1353     08/28/18 2000  vancomycin (VANCOCIN) 1,250 mg in sodium chloride 0.9 % 250 mL IVPB  Status:  Discontinued     1,250 mg 166.7 mL/hr over 90 Minutes Intravenous Every 12 hours 08/28/18 1725 08/30/18 0853   08/28/18 1500  piperacillin-tazobactam (ZOSYN) IVPB 3.375 g  Status:  Discontinued     3.375 g 12.5 mL/hr over 240 Minutes  Intravenous Every 8 hours 08/28/18 1245 08/31/18 0844   08/28/18 0900  vancomycin (VANCOCIN) 1,500 mg in sodium chloride 0.9 % 500 mL IVPB     1,500 mg 250 mL/hr over 120 Minutes Intravenous  Once 08/28/18 0854 08/28/18 1218   08/28/18 0900  piperacillin-tazobactam (ZOSYN) IVPB 3.375 g     3.375 g 100 mL/hr over 30 Minutes Intravenous  Once 08/28/18 0854 08/28/18 0935     Subjective: Seen and Examined prior to going onto his MBS and he was little more fatigued.  Wife states that he was able to tolerate up until a certain point and had to be placed at back on BiPAP and was little more sleepy this morning.  Requested that the Ativan be discontinued.  Denies any chest pain, shortness breath and felt ok.  Objective: Vitals:   09/09/18 1154 09/09/18 1200 09/09/18 1600 09/09/18 1607  BP:  101/67 111/67   Pulse:  94 93   Resp:  19 14   Temp: (!) 96.6 F (35.9 C)   (!) 97.5 F (36.4 C)  TempSrc: Axillary   Axillary  SpO2:  94% 96%   Weight:      Height:        Intake/Output Summary (Last 24 hours) at 09/09/2018 1716 Last data filed at 09/09/2018 1659 Gross per 24 hour  Intake 2506.54 ml  Output 3026 ml  Net -519.46 ml   Filed Weights   08/28/18 1400 09/09/18 1128  Weight: 84.2 kg 86.1 kg   Examination: Physical Exam:  Constitutional: Alert, well-developed obese Caucasian male is chronically ill-appearing appears fatigued but is improved respiratory wise from yesterday Eyes: Lids and conjunctive are normal.  Sclera anicteric ENMT: Nose and external ears appear normal.  Grossly normal hearing Neck: Appears supple no JVD Respiratory: Diminished air entry with diminished breath sounds bibasilarly with some rales but is improving.  Patient no longer on BiPAP and tolerating on 8 L of doubtful.  Had mildly labored breathing Cardiovascular: Regular rate and rhythm.  No appreciable murmurs, rubs or gallops. Abdomen: Soft, nontender, nondistended.  Bowel sounds present all 4 quadrants GU:  Deferred Musculoskeletal: No contractures or cyanosis.  No joint deformity noted Skin: Skin is warm and dry with no appreciable rashes or lesions limited skin evaluation Neurologic: Cranial nerves II through XII grossly intact no appreciable focal deficit Psychiatric: Normal judgment and insight.  Patient is awake, alert and oriented x3.  He is very fatigued appearing however is a little bit more upbeat today but still remains having a flat affect   Data Reviewed:  I have personally reviewed following labs and imaging studies  CBC: Recent Labs  Lab 09/05/18 0440 09/06/18 0443 09/07/18 0430 09/08/18 0348 09/09/18 0330  WBC 7.9 9.2 11.4* 11.5* 11.8*  NEUTROABS 7.4 8.6* 11.4* 11.1* 11.4*  HGB 7.7* 7.9* 8.4* 8.4* 9.0*  HCT 23.2* 23.6* 25.6* 25.5* 27.1*  MCV 91.3 91.8 93.4 93.1 92.8  PLT 78* 71* 69* 66* 72*   Basic Metabolic Panel: Recent Labs  Lab 09/04/18 0323 09/05/18 0440 09/06/18 0443 09/07/18 0430 09/08/18 0348 09/09/18 0330  NA 128* 131* 128* 131* 127* 128*  K 4.7 4.8 4.1 4.4 4.8 4.2  CL 94* 96* 93* 96* 92* 93*  CO2 24 22 22 23 23 23   GLUCOSE 289* 271* 231* 186* 145* 160*  BUN 15 16 20 21 19 23   CREATININE 0.36* 0.41* 0.41* 0.46* 0.43* 0.42*  CALCIUM 7.8* 7.8* 7.8* 8.2* 7.9* 8.1*  MG 2.5*  --  2.4 2.5* 2.5* 2.6*  PHOS  --   --  2.8 3.5 3.4 3.7   GFR: Estimated Creatinine Clearance: 112.7 mL/min (A) (by C-G formula based on SCr of 0.42 mg/dL (L)). Liver Function Tests: Recent Labs  Lab 09/06/18 0443 09/07/18 0430 09/08/18 0348 09/09/18 0330  AST 28 30 26 28   ALT 26 27 24 25   ALKPHOS 183* 196* 176* 187*  BILITOT 0.3 0.4 0.2* 0.2*  PROT 4.9* 5.0* 4.9* 4.9*  ALBUMIN 1.6* 1.9* 1.8* 2.1*   No results for input(s): LIPASE, AMYLASE in the last 168 hours. No results for input(s): AMMONIA in the last 168 hours. Coagulation Profile: No results for input(s): INR, PROTIME in the last 168 hours. Cardiac Enzymes: No results for input(s): CKTOTAL, CKMB, CKMBINDEX,  TROPONINI in the last 168 hours. BNP (last 3 results) No results for input(s): PROBNP in the last 8760 hours. HbA1C: No results for input(s): HGBA1C in the last 72 hours. CBG: Recent Labs  Lab 09/09/18 0005 09/09/18 0352 09/09/18 0722 09/09/18 1108 09/09/18 1542  GLUCAP 263* 146* 179* 166* 245*   Lipid Profile: No results for input(s): CHOL, HDL, LDLCALC, TRIG, CHOLHDL, LDLDIRECT in the last 72 hours. Thyroid Function Tests: No results for input(s): TSH, T4TOTAL, FREET4, T3FREE, THYROIDAB in the last 72 hours. Anemia Panel: No results for input(s): VITAMINB12, FOLATE, FERRITIN, TIBC, IRON, RETICCTPCT in the last 72 hours. Sepsis Labs: No results for input(s): PROCALCITON, LATICACIDVEN in the last 168 hours.  Recent Results (from the past 240 hour(s))  Respiratory Panel by PCR     Status: None   Collection Time: 09/01/18 11:46 AM  Result Value Ref Range Status   Adenovirus NOT DETECTED NOT DETECTED Final   Coronavirus 229E NOT DETECTED NOT DETECTED Final   Coronavirus HKU1 NOT DETECTED NOT DETECTED Final   Coronavirus NL63 NOT DETECTED NOT DETECTED Final   Coronavirus OC43 NOT DETECTED NOT DETECTED Final   Metapneumovirus NOT DETECTED NOT DETECTED Final   Rhinovirus / Enterovirus NOT DETECTED NOT DETECTED Final   Influenza A NOT DETECTED NOT DETECTED Final   Influenza B NOT DETECTED NOT DETECTED Final   Parainfluenza Virus 1 NOT DETECTED NOT DETECTED Final   Parainfluenza Virus 2 NOT DETECTED NOT DETECTED Final   Parainfluenza Virus 3 NOT DETECTED NOT DETECTED Final   Parainfluenza Virus 4 NOT DETECTED NOT DETECTED Final   Respiratory Syncytial Virus NOT DETECTED NOT DETECTED Final   Bordetella pertussis NOT DETECTED NOT DETECTED Final   Chlamydophila pneumoniae NOT DETECTED NOT DETECTED Final   Mycoplasma pneumoniae NOT DETECTED NOT DETECTED Final  Comment: Performed at [facility], [place] [address] [place], [place] [address]    Radiology Studies: Dg Chest Port  1 View  Result Date: 09/09/2018 CLINICAL DATA:  Shortness of breath. EXAM: PORTABLE CHEST 1 VIEW COMPARISON:  Radiograph of September 08, 2018. FINDINGS: The heart size and mediastinal contours are within normal limits. No pneumothorax or pleural effusion is noted. Right internal jugular Port-A-Cath is noted with distal tip in expected position of cavoatrial junction. Stable right basilar atelectasis or infiltrate is noted. Stable left lingular subsegmental atelectasis or infiltrate is noted. The visualized skeletal structures are unremarkable. IMPRESSION: Stable bilateral lung opacities as described above. Electronically Signed   By: Marijo Conception, M.D.   On: 09/09/2018 07:47   Dg Chest Port 1 View  Result Date: 09/08/2018 CLINICAL DATA:  Shortness of breath EXAM: PORTABLE CHEST 1 VIEW COMPARISON:  September 07, 2018 FINDINGS: Port-A-Cath tip is at the cavoatrial junction. No pneumothorax. There is patchy airspace opacity throughout the right lower lobe as well as to a lesser extent in the left lower lobe. Heart is upper normal in size with pulmonary vascularity normal. No adenopathy. There is aortic atherosclerosis. No bone lesions. IMPRESSION: Central catheter as described without pneumothorax. Patchy infiltrate in both lower lobes, more on the right than on the left, essentially stable. Stable cardiac silhouette. There is aortic atherosclerosis. Aortic Atherosclerosis (ICD10-I70.0). Electronically Signed   By: Lowella Grip III M.D.   On: 09/08/2018 07:04   Dg Swallowing Func-speech Pathology  Result Date: 09/09/2018 Objective Swallowing Evaluation: Type of Study: MBS-Modified Barium Swallow Study  Patient Details Name: AYAZ SONDGEROTH MRN: [id number] Date of Birth: 07-03-1956 Today's Date: 09/09/2018 Time: SLP Start Time (ACUTE ONLY): 0840 -SLP Stop Time (ACUTE ONLY): 0909 SLP Time Calculation (min) (ACUTE ONLY): 29 min Past Medical History: Past Medical History: Diagnosis Date . Acute MI ([place]) 2014   acute ST elevation MI . Anemia  . Colon cancer ([place]) 2016  Status post resection of colon mass is well as he panic metastasis.  . Coronary artery disease  . Diabetes mellitus without complication ([place])  . Enlarged prostate   slightly and takes Flomax daily . Heart disease  . History of blood transfusion  . Hyperlipidemia   takes Lipitor daily . Hypertension   takes Lisinopril daily . Lung nodule   left . Nocturia  . Numbness   left foot . Pancreatitis  . Peripheral neuropathy  Past Surgical History: Past Surgical History: Procedure Laterality Date . 1/8 of liver removed   . APPENDECTOMY   . APPLICATION OF CRANIAL NAVIGATION Left 8/67/6195  Procedure: APPLICATION OF CRANIAL NAVIGATION;  Surgeon: Erline Levine, MD;  Location: [facility];  Service: Neurosurgery;  Laterality: Left; . BIOPSY  05/22/2018  Procedure: BIOPSY;  Surgeon: Carol Ada, MD;  Location: [facility];  Service: Endoscopy;; . BRAIN SURGERY  06/04/2018 . COLONOSCOPY   . CORONARY ANGIOPLASTY    1 stent . CORONARY STENT PLACEMENT  06-27-2013 . CRANIOTOMY Left 06/04/2018  Procedure: CRANIOTOMY TUMOR EXCISION with Brainlab;  Surgeon: Erline Levine, MD;  Location: [facility];  Service: Neurosurgery;  Laterality: Left;  CRANIOTOMY TUMOR EXCISION with Brainlab . ESOPHAGOGASTRODUODENOSCOPY Left 05/22/2018  Procedure: ESOPHAGOGASTRODUODENOSCOPY (EGD);  Surgeon: Carol Ada, MD;  Location: [facility];  Service: Endoscopy;  Laterality: Left; . HERNIA REPAIR Left 1991 . IR IMAGING GUIDED PORT INSERTION  07/18/2018 . LAPAROSCOPIC RIGHT COLECTOMY  2016  [place] . LIVER LOBECTOMY Right 09/28/2015   Right partial hepatectomy at [facility] . LUNG BIOPSY Left  11/28/2016  Procedure: LUNG BIOPSY, left upper lobe;  Surgeon: [name], MD;  Location: [facility];  Service: Thoracic;  Laterality: Left; Marland Kitchen VIDEO BRONCHOSCOPY WITH ENDOBRONCHIAL NAVIGATION N/A 11/28/2016  Procedure: VIDEO BRONCHOSCOPY WITH ENDOBRONCHIAL NAVIGATION;  Surgeon: [name], MD;  Location: [facility];   Service: Thoracic;  Laterality: N/A; . VIDEO BRONCHOSCOPY WITH ENDOBRONCHIAL ULTRASOUND N/A 11/28/2016  Procedure: VIDEO BRONCHOSCOPY WITH ENDOBRONCHIAL ULTRASOUND;  Surgeon: [name], MD;  Location: [facility];  Service: Thoracic;  Laterality: N/A; HPI: 62 yo male adm to [facility] with hypoxia, increased lactic acid, febrile. 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  .  Pt has required Bipap on and off but currently is on 8 L HFNC.  He desires to eat.  concern for possible ARDS noted - pt with severe pna - right more than left patchy infiltrate.  WBC increasing from 5.7 to 11.5.  He has been made a DNR during this hospital coarse.   Pt's son and daughter present during eval and state pt wants to eat.   Subjective: pt awake in bed, sleepy but participative Assessment / Plan / Recommendation CHL IP CLINICAL IMPRESSIONS 09/09/2018 Clinical Impression Pt presents with moderately severe oral and mild pharyngeal dysphagia.  Decreased oral bolus control/organization and transiting noted due to weakness.  Pt frequently spills boluses into pharynx poorly controlled and oral residuals present.  He intermittently conducts dry swallow to clear oral cavity- at times requiring verbal cue.  Pharyngeal swallow is strong when triggered.  Secretions noted in pharynx which pt does not sense= nor did he clear with intake.  SLP challenged pt during MBS by providing him with 3 boluses of applesauce to swallow to assess for fatigue.  At end of MBS, pt did present with minimal residuals in pharynx without awareness, cued dry swallow effective to clear.  Educated pt and wife to findings/recommendations and concerns for pt to not receive adequate nutrition due to current level of dysphagia.  Although he did not aspirate on this MBS, he will continue to be high risk due to oral deficits and gross weakness.  Dys1 (extra creamy) and thin would be least  restrictive diet for him with strict precautions.  Educated pt/wife to findings/recommendations and provided swallow signs to RN.  Will follow up briefly for tolerance.   SLP Visit Diagnosis Dysphagia, oropharyngeal phase (R13.12);Dysphagia, unspecified (R13.10) Attention and concentration deficit following -- Frontal lobe and executive function deficit following -- Impact on safety and function Moderate aspiration risk   CHL IP TREATMENT RECOMMENDATION 09/09/2018 Treatment Recommendations Therapy as outlined in treatment plan below   Prognosis 09/09/2018 Prognosis for Safe Diet Advancement Fair Barriers to Reach Goals Severity of deficits;Other (Comment) Barriers/Prognosis Comment -- CHL IP DIET RECOMMENDATION 09/09/2018 SLP Diet Recommendations Dysphagia 1 (Puree) solids;Thin liquid Liquid Administration via Cup;Straw Medication Administration Crushed with puree Compensations Minimize environmental distractions;Slow rate;Small sips/bites;Other (Comment) Postural Changes Remain semi-upright after after feeds/meals (Comment);Seated upright at 90 degrees   CHL IP OTHER RECOMMENDATIONS 09/09/2018 Recommended Consults -- Oral Care Recommendations (No Data) Other Recommendations Have oral suction available   CHL IP FOLLOW UP RECOMMENDATIONS 09/09/2018 Follow up Recommendations (No Data)   CHL IP FREQUENCY AND DURATION 09/09/2018 Speech Therapy Frequency (ACUTE ONLY) min 1 x/week Treatment Duration 1 week      CHL IP ORAL PHASE 09/09/2018 Oral Phase Impaired Oral - Pudding Teaspoon -- Oral - Pudding Cup -- Oral -  Honey Teaspoon -- Oral - Honey Cup -- Oral - Nectar Teaspoon -- Oral - Nectar Cup -- Oral - Nectar Straw Delayed oral transit;Decreased bolus cohesion;Lingual/palatal residue;Premature spillage;Weak lingual manipulation;Lingual pumping;Reduced posterior propulsion Oral - Thin Teaspoon Delayed oral transit;Decreased bolus cohesion;Lingual/palatal residue;Premature spillage;Weak lingual manipulation;Reduced posterior  propulsion Oral - Thin Cup Delayed oral transit;Decreased bolus cohesion;Lingual/palatal residue;Premature spillage;Weak lingual manipulation;Lingual pumping;Reduced posterior propulsion Oral - Thin Straw Delayed oral transit;Decreased bolus cohesion;Lingual/palatal residue;Premature spillage;Weak lingual manipulation;Lingual pumping Oral - Puree Delayed oral transit;Premature spillage;Weak lingual manipulation;Lingual/palatal residue;NT;Reduced posterior propulsion Oral - Mech Soft Delayed oral transit;Premature spillage;Weak lingual manipulation;Lingual/palatal residue;Lingual pumping;Reduced posterior propulsion;Impaired mastication Oral - Regular -- Oral - Multi-Consistency -- Oral - Pill Delayed oral transit;Premature spillage;Weak lingual manipulation;Lingual/palatal residue;Lingual pumping;Reduced posterior propulsion Oral Phase - Comment delayed 16 seconds to masticate a cracker bolus and transit, minimal mastication;  delay with puree 6-7 seconds oral transiting, premature spillage with poor control noted, oral residuals present without inconsistent awareness  CHL IP PHARYNGEAL PHASE 09/09/2018 Pharyngeal Phase Impaired Pharyngeal- Pudding Teaspoon -- Pharyngeal -- Pharyngeal- Pudding Cup -- Pharyngeal -- Pharyngeal- Honey Teaspoon -- Pharyngeal -- Pharyngeal- Honey Cup -- Pharyngeal -- Pharyngeal- Nectar Teaspoon -- Pharyngeal -- Pharyngeal- Nectar Cup -- Pharyngeal -- Pharyngeal- Nectar Straw WFL Pharyngeal -- Pharyngeal- Thin Teaspoon -- Pharyngeal -- Pharyngeal- Thin Cup WFL Pharyngeal -- Pharyngeal- Thin Straw WFL;Delayed swallow initiation-pyriform sinuses;Pharyngeal residue - valleculae;Pharyngeal residue - pyriform Pharyngeal -- Pharyngeal- Puree WFL Pharyngeal -- Pharyngeal- Mechanical Soft WFL Pharyngeal -- Pharyngeal- Regular -- Pharyngeal -- Pharyngeal- Multi-consistency -- Pharyngeal -- Pharyngeal- Pill WFL Pharyngeal -- Pharyngeal Comment --  CHL IP CERVICAL ESOPHAGEAL PHASE 09/09/2018 Cervical  Esophageal Phase Impaired Pudding Teaspoon -- Pudding Cup -- Honey Teaspoon -- Honey Cup -- Nectar Teaspoon -- Nectar Cup -- Nectar Straw -- Thin Teaspoon -- Thin Cup -- Thin Straw -- Puree -- Mechanical Soft -- Regular -- Multi-consistency -- Pill -- Cervical Esophageal Comment upon esophageal sweep after barium tablet administered with pudding, pt appeared with stasis in esophagus WITHOUT awareness, liquid bolus helpful to clear Macario Golds 09/09/2018, 10:04 AM    Luanna Salk, MS [facility] Pager [phone] Office [phone]           Scheduled Meds: . bisacodyl  10 mg Rectal Once  . chlorhexidine  15 mL Mouth Rinse BID  . Chlorhexidine Gluconate Cloth  6 each Topical Daily  . furosemide  40 mg Intravenous Daily  . gabapentin  300 mg Oral TID  . insulin aspart  0-15 Units Subcutaneous Q4H  . insulin glargine  33 Units Subcutaneous QHS  . LORazepam  1 mg Intravenous QHS  . mouth rinse  15 mL Mouth Rinse q12n4p  . methylPREDNISolone (SOLU-MEDROL) injection  60 mg Intravenous Q8H  . morphine  15 mg Oral Daily  . [START ON 09/10/2018] multivitamin  15 mL Oral Daily  . nystatin  5 mL Oral QID  . pantoprazole (PROTONIX) IV  40 mg Intravenous Q12H  . polyvinyl alcohol  1 drop Both Eyes TID  . protein supplement shake  11 oz Oral TID BM  . sodium chloride flush  3 mL Intravenous Q12H   Continuous Infusions: . sodium chloride Stopped (08/30/18 0808)  . sodium chloride 10 mL/hr at 09/09/18 1659  . levofloxacin (LEVAQUIN) IV Stopped (09/09/18 1622)  . sulfamethoxazole-trimethoprim Stopped (09/09/18 1456)  . vancomycin 10 mL/hr at 09/09/18 1659    LOS: 12 days   Kerney Elbe, DO [facility] PAGER is on [place]  If 7PM-7AM,  please contact night-coverage [email or web address] Password [facility] 09/09/2018, 5:16 PM

## 2018-09-09 NOTE — Progress Notes (Signed)
Nutrition Follow-up  DOCUMENTATION CODES:   (Will assess for malnutrition at follow-up)  INTERVENTION:  - Diet advancement per SLP recommendation. - Continue Premier Protein TID.  - Will order Magic Cup BID with meals, each supplement provides 290 kcal and 9 grams of protein. - Continue to encourage PO intakes as tolerated.  - Will place order for patient to be weighed today.  - Will monitor for POC/GOC; suspect that patient will be unable to orally meet estimated nutrition needs.    NUTRITION DIAGNOSIS:   Increased nutrient needs related to chronic illness, catabolic illness, cancer and cancer related treatments, acute illness as evidenced by estimated needs. -ongoing  GOAL:   Patient will meet greater than or equal to 90% of their needs -unmet  MONITOR:   PO intake, Supplement acceptance, Weight trends, Labs  REASON FOR ASSESSMENT:   Consult Assessment of nutrition requirement/status, Poor PO  ASSESSMENT:   62 year-old with PMH of stage 4 colon cancer with liver mets s/p R colectomy and last chemo on 08/19/18, HTN, and Type 2 DM. Patient admitted with dx of sepsis, pancytopenia, and neutropenia with fever.  Patient being followed by Palliative Care. Previously on BiPAP with difficulty weaning off; patient on [place] at the time of RD visit a short time ago. SLP saw patient for bedside swallow evaluation yesterday evening and for MBS earlier this AM. SLP note reviewed and noted her recommendation for Dysphagia 1, thin liquids. Patient very weak and only able to whisper at the time of RD visit. Daughter was at bedside. Patient greatly enjoys chocolate flavored items such as Premier Protein, ice cream, and pudding. He is interested in having meat, such as meatloaf, at this time.   No new weight since admission. Family politely declined NFPE being done at this time as patient covered in sheets, blankets, and prayer shawl. Desire for patient to get some rest at this time.      Medications reviewed; 40 mg IV Lasix/day, sliding scale Novolog, 33 units Lantus/day, 60 mg Solu-medrol TID, daily multivitamin with minerals, 40 mg IV Protonix BID. Labs reviewed; CBGs: 263, 146, and 179 mg/dL today, Na: 128 mmol/L, Cl: 93 mmol/L, creatinine: 0.42 mg/dL, Ca: 8.1 mg/dL, Mg: 2.6 mg/dL, Alk Phos elevated.   Diet Order:   Diet Order            Diet clear liquid Room service appropriate? Yes; Fluid consistency: Thin  Diet effective now              EDUCATION NEEDS:   No education needs have been identified at this time  Skin:  Skin Assessment: Reviewed RN Assessment  Last BM:  9/11  Height:   Ht Readings from Last 1 Encounters:  08/28/18 _0  (1.88 m)    Weight:   Wt Readings from Last 1 Encounters:  08/28/18 84.2 kg    Ideal Body Weight:  86.36 kg  BMI:  Body mass index is 23.83 kg/m.  Estimated Nutritional Needs:   Kcal:  2525-2780 (30-33 kcal/kg)  Protein:  125-140 grams (~1.5-1.7 grams/kg)  Fluid:  >/= 2.5 L/day     Jarome Matin, MS, RD, LDN, [facility] Inpatient Clinical Dietitian Pager # [phone] After hours/weekend pager # [phone]

## 2018-09-09 NOTE — Progress Notes (Signed)
Patient ID: Allen Glenn, male   DOB: Jul 05, 1956, 62 y.o.   MRN: [id number]         [facility]  Date of Admission:  08/28/2018    Day 13 vancomycin        Day 12 levofloxacin        Day 10 fluconazole        Day 8 trimethoprim sulfamethoxazole ASSESSMENT: He is improving with time, steroids and broad empiric antibiotic therapy for pneumonia.  Again stopping some of his antibiotics.  We will stop fluconazole today and vancomycin and levofloxacin within the next 48 hours.  On continuing trimethoprim sulfamethoxazole and steroids for a full 3 weeks.  PLAN: 1. Continue trimethoprim sulfamethoxazole and steroids 2. Discontinue fluconazole 3. 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 bisacodyl  10 mg Rectal Once  . chlorhexidine  15 mL Mouth Rinse BID  . Chlorhexidine Gluconate Cloth  6 each Topical Daily  . furosemide  40 mg Intravenous Daily  . gabapentin  300 mg Oral TID  . insulin aspart  0-15 Units Subcutaneous Q4H  . insulin glargine  33 Units Subcutaneous QHS  . LORazepam  0.5 mg Intravenous 2 times per day  . LORazepam  1 mg Intravenous QHS  . mouth rinse  15 mL Mouth Rinse q12n4p  . methylPREDNISolone (SOLU-MEDROL) injection  60 mg Intravenous Q8H  . morphine  15 mg Oral Daily    . [START ON 09/10/2018] multivitamin  15 mL Oral Daily  . nystatin  5 mL Oral QID  . pantoprazole (PROTONIX) IV  40 mg Intravenous Q12H  . polyvinyl alcohol  1 drop Both Eyes TID  . protein supplement shake  11 oz Oral TID BM  . sodium chloride flush  3 mL Intravenous Q12H   Continuous Infusions: . sodium chloride Stopped (08/30/18 0808)  . sodium chloride 10 mL/hr at 09/09/18 1233  . fluconazole (DIFLUCAN) IV Stopped (09/09/18 1227)  . levofloxacin (LEVAQUIN) IV 750 mg (09/09/18 1452)  . sulfamethoxazole-trimethoprim Stopped (09/09/18 1456)  . vancomycin 10 mL/hr at 09/09/18 1233   PRN Meds:.sodium chloride, sodium chloride, acetaminophen **OR** acetaminophen, diphenoxylate-atropine, hydrALAZINE, levalbuterol, magic mouthwash, metoCLOPramide (REGLAN) injection, metoprolol tartrate, morphine injection, nitroGLYCERIN, ondansetron **OR** ondansetron (ZOFRAN) IV, polyethylene glycol, sodium chloride flush, sodium chloride flush, traZODone   SUBJECTIVE: After I reviewed my current plan with him and his children he said "I appreciate it".  Review of Systems: Review of Systems  Unable to perform ROS: Critical illness    No Known Allergies  OBJECTIVE: Vitals:   09/09/18 0800 09/09/18 1128 09/09/18 1154 09/09/18 1200  BP: 128/73   101/67  Pulse: 97   94  Resp: (!) 26   19  Temp:   (!) 96.6 F (35.9 C)  TempSrc:   Axillary   SpO2: 94%   94%  Weight:  86.1 kg    Height:       Body mass index is 24.37 kg/m.  Physical Exam  Constitutional:  He is sitting up in bed.  He is now off BiPAP and on nasal prong oxygen.  He appears comfortable but extremely weak.  Cardiovascular: Normal rate, regular rhythm and normal heart sounds.  No murmur heard. Pulmonary/Chest: Effort normal and breath sounds normal.  Neurological: He is alert.  Skin: No rash noted.    Lab Results Lab Results  Component Value Date   WBC 11.8 (H) 09/09/2018   HGB 9.0 (L) 09/09/2018   HCT 27.1 (L)  09/09/2018   MCV 92.8 09/09/2018   PLT 72 (L) 09/09/2018    Lab Results  Component Value Date   CREATININE 0.42 (L) 09/09/2018   BUN 23 09/09/2018   NA 128 (L) 09/09/2018   K 4.2 09/09/2018   CL 93 (L) 09/09/2018   CO2 23 09/09/2018    Lab Results  Component Value Date   ALT 25 09/09/2018   AST 28 09/09/2018   ALKPHOS 187 (H) 09/09/2018   BILITOT 0.2 (L) 09/09/2018     Microbiology: Recent Results (from the past 240 hour(s))  Respiratory Panel by PCR     Status: None   Collection Time: 09/01/18 11:46 AM  Result Value Ref Range Status   Adenovirus NOT DETECTED NOT DETECTED Final   Coronavirus 229E NOT DETECTED NOT DETECTED Final   Coronavirus HKU1 NOT DETECTED NOT DETECTED Final   Coronavirus NL63 NOT DETECTED NOT DETECTED Final   Coronavirus OC43 NOT DETECTED NOT DETECTED Final   Metapneumovirus NOT DETECTED NOT DETECTED Final   Rhinovirus / Enterovirus NOT DETECTED NOT DETECTED Final   Influenza A NOT DETECTED NOT DETECTED Final   Influenza B NOT DETECTED NOT DETECTED Final   Parainfluenza Virus 1 NOT DETECTED NOT DETECTED Final   Parainfluenza Virus 2 NOT DETECTED NOT DETECTED Final   Parainfluenza Virus 3 NOT DETECTED NOT DETECTED Final   Parainfluenza Virus 4 NOT DETECTED NOT DETECTED Final   Respiratory Syncytial Virus NOT DETECTED NOT DETECTED Final   Bordetella pertussis NOT DETECTED NOT DETECTED Final   Chlamydophila pneumoniae NOT DETECTED NOT DETECTED Final   Mycoplasma pneumoniae NOT DETECTED NOT DETECTED Final    Comment: Performed at [facility], [address], [place], [place] [address]    Michel Bickers, [place] [facility] [place] [facility] [phone] pager   [phone] cell 09/09/2018, 3:06 PM

## 2018-09-10 ENCOUNTER — Inpatient Hospital Stay (HOSPITAL_COMMUNITY): Payer: 59

## 2018-09-10 ENCOUNTER — Ambulatory Visit: Payer: Self-pay | Admitting: Radiation Oncology

## 2018-09-10 DIAGNOSIS — I959 Hypotension, unspecified: Secondary | ICD-10-CM

## 2018-09-10 LAB — CBC WITH DIFFERENTIAL/PLATELET
BASOS ABS: 0 10*3/uL (ref 0.0–0.1)
BASOS ABS: 0 10*3/uL (ref 0.0–0.1)
BASOS PCT: 0 %
Basophils Relative: 0 %
EOS ABS: 0 10*3/uL (ref 0.0–0.7)
EOS PCT: 0 %
Eosinophils Absolute: 0 10*3/uL (ref 0.0–0.7)
Eosinophils Relative: 0 %
HCT: 28.3 % — ABNORMAL LOW (ref 39.0–52.0)
HEMATOCRIT: 27.1 % — AB (ref 39.0–52.0)
HEMOGLOBIN: 9 g/dL — AB (ref 13.0–17.0)
Hemoglobin: 9.3 g/dL — ABNORMAL LOW (ref 13.0–17.0)
Lymphocytes Relative: 2 %
Lymphocytes Relative: 2 %
Lymphs Abs: 0.2 10*3/uL — ABNORMAL LOW (ref 0.7–4.0)
Lymphs Abs: 0.3 10*3/uL — ABNORMAL LOW (ref 0.7–4.0)
MCH: 30.8 pg (ref 26.0–34.0)
MCH: 30.6 pg (ref 26.0–34.0)
MCHC: 33.2 g/dL (ref 30.0–36.0)
MCHC: 32.9 g/dL (ref 30.0–36.0)
MCV: 92.8 fL (ref 78.0–100.0)
MCV: 93.1 fL (ref 78.0–100.0)
MONO ABS: 0.3 10*3/uL (ref 0.1–1.0)
MONOS PCT: 1 %
MONOS PCT: 2 %
Monocytes Absolute: 0.1 10*3/uL (ref 0.1–1.0)
NEUTROS PCT: 97 %
Neutro Abs: 11.4 10*3/uL — ABNORMAL HIGH (ref 1.7–7.7)
Neutro Abs: 13.5 10*3/uL — ABNORMAL HIGH (ref 1.7–7.7)
Neutrophils Relative %: 96 %
Platelets: 72 10*3/uL — ABNORMAL LOW (ref 150–400)
Platelets: 87 10*3/uL — ABNORMAL LOW (ref 150–400)
RBC: 2.92 MIL/uL — ABNORMAL LOW (ref 4.22–5.81)
RBC: 3.04 MIL/uL — ABNORMAL LOW (ref 4.22–5.81)
RDW: 18.8 % — ABNORMAL HIGH (ref 11.5–15.5)
RDW: 18.8 % — AB (ref 11.5–15.5)
WBC: 11.8 10*3/uL — AB (ref 4.0–10.5)
WBC: 14.1 10*3/uL — ABNORMAL HIGH (ref 4.0–10.5)

## 2018-09-10 LAB — GLUCOSE, CAPILLARY
GLUCOSE-CAPILLARY: 155 mg/dL — AB (ref 70–99)
GLUCOSE-CAPILLARY: 283 mg/dL — AB (ref 70–99)
Glucose-Capillary: 176 mg/dL — ABNORMAL HIGH (ref 70–99)
Glucose-Capillary: 201 mg/dL — ABNORMAL HIGH (ref 70–99)
Glucose-Capillary: 180 mg/dL — ABNORMAL HIGH (ref 70–99)

## 2018-09-10 LAB — PHOSPHORUS: PHOSPHORUS: 3.4 mg/dL (ref 2.5–4.6)

## 2018-09-10 LAB — COMPREHENSIVE METABOLIC PANEL
ALBUMIN: 2.2 g/dL — AB (ref 3.5–5.0)
ALT: 25 U/L (ref 0–44)
ANION GAP: 12 (ref 5–15)
AST: 28 U/L (ref 15–41)
Alkaline Phosphatase: 177 U/L — ABNORMAL HIGH (ref 38–126)
BILIRUBIN TOTAL: 0.4 mg/dL (ref 0.3–1.2)
BUN: 24 mg/dL — ABNORMAL HIGH (ref 8–23)
CALCIUM: 8.4 mg/dL — AB (ref 8.9–10.3)
CO2: 23 mmol/L (ref 22–32)
Chloride: 94 mmol/L — ABNORMAL LOW (ref 98–111)
Creatinine, Ser: 0.42 mg/dL — ABNORMAL LOW (ref 0.61–1.24)
Glucose, Bld: 154 mg/dL — ABNORMAL HIGH (ref 70–99)
POTASSIUM: 5 mmol/L (ref 3.5–5.1)
Sodium: 129 mmol/L — ABNORMAL LOW (ref 135–145)
TOTAL PROTEIN: 5.1 g/dL — AB (ref 6.5–8.1)

## 2018-09-10 LAB — MAGNESIUM: MAGNESIUM: 2.7 mg/dL — AB (ref 1.7–2.4)

## 2018-09-10 MED ORDER — PANTOPRAZOLE SODIUM 40 MG IV SOLR
40.0000 mg | Freq: Every day | INTRAVENOUS | Status: DC
  Administered 2018-09-11 – 2018-09-16 (×6): 40 mg via INTRAVENOUS
  Filled 2018-09-10 (×6): qty 40

## 2018-09-10 MED ORDER — INSULIN ASPART 100 UNIT/ML ~~LOC~~ SOLN
0.0000 [IU] | Freq: Three times a day (TID) | SUBCUTANEOUS | Status: DC
  Administered 2018-09-10: 3 [IU] via SUBCUTANEOUS
  Administered 2018-09-10: 5 [IU] via SUBCUTANEOUS
  Administered 2018-09-11: 8 [IU] via SUBCUTANEOUS
  Administered 2018-09-11 (×2): 3 [IU] via SUBCUTANEOUS
  Administered 2018-09-12: 8 [IU] via SUBCUTANEOUS
  Administered 2018-09-12 (×2): 5 [IU] via SUBCUTANEOUS
  Administered 2018-09-13: 8 [IU] via SUBCUTANEOUS
  Administered 2018-09-13: 3 [IU] via SUBCUTANEOUS
  Administered 2018-09-13: 2 [IU] via SUBCUTANEOUS
  Administered 2018-09-14: 3 [IU] via SUBCUTANEOUS
  Administered 2018-09-14: 2 [IU] via SUBCUTANEOUS
  Administered 2018-09-14: 5 [IU] via SUBCUTANEOUS
  Administered 2018-09-15: 2 [IU] via SUBCUTANEOUS
  Administered 2018-09-15: 8 [IU] via SUBCUTANEOUS
  Administered 2018-09-16: 3 [IU] via SUBCUTANEOUS
  Administered 2018-09-16: 5 [IU] via SUBCUTANEOUS
  Administered 2018-09-17: 2 [IU] via SUBCUTANEOUS
  Administered 2018-09-17: 5 [IU] via SUBCUTANEOUS
  Administered 2018-09-17 – 2018-09-18 (×2): 8 [IU] via SUBCUTANEOUS
  Administered 2018-09-18: 5 [IU] via SUBCUTANEOUS
  Administered 2018-09-19: 8 [IU] via SUBCUTANEOUS
  Administered 2018-09-19: 2 [IU] via SUBCUTANEOUS
  Administered 2018-09-20: 3 [IU] via SUBCUTANEOUS
  Administered 2018-09-20: 2 [IU] via SUBCUTANEOUS
  Administered 2018-09-20 – 2018-09-22 (×4): 5 [IU] via SUBCUTANEOUS
  Administered 2018-09-22: 8 [IU] via SUBCUTANEOUS

## 2018-09-10 NOTE — Progress Notes (Signed)
PROGRESS NOTE    Allen Glenn   GTX:[id number]  DOB: 26-Dec-1955  DOA: 08/28/2018 PCP: [name], MD   Brief Narrative:  Allen Glenn is a 62 year old male with hypertension, coronary artery disease, diabetes mellitus, metastatic colon cancer on chemotherapy who presented to the hospital for fever of 102 degrees, full oxygen level and fast heart rate.  Noted to be febrile and hypoxic in the ED and was found to have an elevated lactic acid.  He was admitted for sepsis suspected to be due to pneumonia.  He was later noted to have progressed to ARDS.   Subjective: Patient has no complaints today.  I have had an extensive conversation with his wife.  Assessment & Plan:   Principal Problem:   Acute respiratory failure with hypoxia  - suspected to be bilateral pneumonia progressing to ARDS - the patient was maintained on a BiPAP and did not need intubation -CT chest performed 9/7 shows extensive opacities throughout both lungs. Small bilateral pleural effusion and bilateral lung nodule compatible with metastatic disease. - oxygen being weaned now and is at ~ 8 L - using BiPAP at night - ID assisting with management of antibiotics - Solumedrol being used to possible pneumonitis and now being tapered - currently receiving Levofloxacin, Vancomycin and Bactrim IV   Active Problems:   Colon cancer s/p right colectomy with metastasis to the lungs, bone, liver and brain - above episode of respiratory failure occurred after the patient received his first chemotherapy -For now chemotherapy is on hold as respiratory issues are being treated -He is managed by [name]  Hypotension with h/o HTN (hypertension) - Lisinopril on hold   Insulin dependent type 2 diabetes mellitus, uncontrolled  - cont ISS AC/HS and Lantus Last A1c 7.9.    Hyponatremia - d/c Lasix today- appears dehydrated today- follow sodium - has grade 1 d CHF-was not on Lasix at home    Normocytic anemia - likely  AOCD- follow  Thrombocytopenia - noted to have a drop in platelets on lab work on 7/22 and has remained low since- Platelets have been stable at 60-90 range follow  DVT prophylaxis: SCDs Code Status: DNR Family Communication: wife Disposition Plan: cont to follow in SDU Consultants:   Oncology  ID  Palliative Care Procedures:   none Antimicrobials:  Anti-infectives (From admission, onward)   Start     Dose/Rate Route Frequency Ordered Stop   09/05/18 2200  vancomycin (VANCOCIN) IVPB 1000 mg/200 mL premix     1,000 mg 200 mL/hr over 60 Minutes Intravenous Every 12 hours 09/05/18 1353     09/01/18 1800  sulfamethoxazole-trimethoprim (BACTRIM) 420 mg in dextrose 5 % 500 mL IVPB     420 mg 350.8 mL/hr over 90 Minutes Intravenous Every 8 hours 09/01/18 1529     09/01/18 1000  fluconazole (DIFLUCAN) IVPB 400 mg  Status:  Discontinued     400 mg 100 mL/hr over 120 Minutes Intravenous Every 24 hours 09/01/18 0838 09/09/18 1512   08/31/18 2200  vancomycin (VANCOCIN) 1,250 mg in sodium chloride 0.9 % 250 mL IVPB  Status:  Discontinued     1,250 mg 166.7 mL/hr over 90 Minutes Intravenous Every 12 hours 08/31/18 0852 09/05/18 1353   08/31/18 1000  meropenem (MERREM) 1 g in sodium chloride 0.9 % 100 mL IVPB  Status:  Discontinued     1 g 200 mL/hr over 30 Minutes Intravenous Every 8 hours 08/31/18 0848 09/01/18 1546   08/31/18 1000  vancomycin (VANCOCIN)  1,750 mg in sodium chloride 0.9 % 500 mL IVPB     1,750 mg 250 mL/hr over 120 Minutes Intravenous  Once 08/31/18 0852 08/31/18 1146   08/29/18 1400  levofloxacin (LEVAQUIN) IVPB 750 mg     750 mg 100 mL/hr over 90 Minutes Intravenous Every 24 hours 08/29/18 1353     08/28/18 2000  vancomycin (VANCOCIN) 1,250 mg in sodium chloride 0.9 % 250 mL IVPB  Status:  Discontinued     1,250 mg 166.7 mL/hr over 90 Minutes Intravenous Every 12 hours 08/28/18 1725 08/30/18 0853   08/28/18 1500  piperacillin-tazobactam (ZOSYN) IVPB 3.375 g  Status:   Discontinued     3.375 g 12.5 mL/hr over 240 Minutes Intravenous Every 8 hours 08/28/18 1245 08/31/18 0844   08/28/18 0900  vancomycin (VANCOCIN) 1,500 mg in sodium chloride 0.9 % 500 mL IVPB     1,500 mg 250 mL/hr over 120 Minutes Intravenous  Once 08/28/18 0854 08/28/18 1218   08/28/18 0900  piperacillin-tazobactam (ZOSYN) IVPB 3.375 g     3.375 g 100 mL/hr over 30 Minutes Intravenous  Once 08/28/18 0854 08/28/18 0935       Objective: Vitals:   09/10/18 0314 09/10/18 0400 09/10/18 0800 09/10/18 0806  BP: 130/75 129/76 117/70   Pulse: (!) 105 (!) 103 95   Resp: (!) 26 15 19    Temp:  98 F (36.7 C)  (!) 97.1 F (36.2 C)  TempSrc:  Axillary  Axillary  SpO2: 96% 96% 97%   Weight:      Height:        Intake/Output Summary (Last 24 hours) at 09/10/2018 1046 Last data filed at 09/10/2018 0521 Gross per 24 hour  Intake 1802.12 ml  Output 2850 ml  Net -1047.88 ml   Filed Weights   08/28/18 1400 09/09/18 1128  Weight: 84.2 kg 86.1 kg    Examination: General exam: Appears comfortable  HEENT: PERRLA, oral mucosa dry, no sclera icterus or thrush Respiratory system: Clear to auscultation. Respiratory effort normal. Cardiovascular system: S1 & S2 heard, RRR.   Gastrointestinal system: Abdomen soft, non-tender, nondistended. Normal bowel sound. No organomegaly Central nervous system: Alert and oriented. No focal neurological deficits. Extremities: No cyanosis, clubbing or edema Skin: No rashes or ulcers Psychiatry:  Mood & affect appropriate.    Data Reviewed: I have personally reviewed following labs and imaging studies  CBC: Recent Labs  Lab 09/06/18 0443 09/07/18 0430 09/08/18 0348 09/09/18 0330 09/10/18 0529  WBC 9.2 11.4* 11.5* 11.8* 14.1*  NEUTROABS 8.6* 11.4* 11.1* 11.4* 13.5*  HGB 7.9* 8.4* 8.4* 9.0* 9.3*  HCT 23.6* 25.6* 25.5* 27.1* 28.3*  MCV 91.8 93.4 93.1 92.8 93.1  PLT 71* 69* 66* 72* 87*   Basic Metabolic Panel: Recent Labs  Lab 09/06/18 0443  09/07/18 0430 09/08/18 0348 09/09/18 0330 09/10/18 0529  NA 128* 131* 127* 128* 129*  K 4.1 4.4 4.8 4.2 5.0  CL 93* 96* 92* 93* 94*  CO2 22 23 23 23 23   GLUCOSE 231* 186* 145* 160* 154*  BUN 20 21 19 23  24*  CREATININE 0.41* 0.46* 0.43* 0.42* 0.42*  CALCIUM 7.8* 8.2* 7.9* 8.1* 8.4*  MG 2.4 2.5* 2.5* 2.6* 2.7*  PHOS 2.8 3.5 3.4 3.7 3.4   GFR: Estimated Creatinine Clearance: 112.7 mL/min (A) (by C-G formula based on SCr of 0.42 mg/dL (L)). Liver Function Tests: Recent Labs  Lab 09/06/18 0443 09/07/18 0430 09/08/18 0348 09/09/18 0330 09/10/18 0529  AST 28 30 26 28 28   ALT  26 27 24 25 25   ALKPHOS 183* 196* 176* 187* 177*  BILITOT 0.3 0.4 0.2* 0.2* 0.4  PROT 4.9* 5.0* 4.9* 4.9* 5.1*  ALBUMIN 1.6* 1.9* 1.8* 2.1* 2.2*   No results for input(s): LIPASE, AMYLASE in the last 168 hours. No results for input(s): AMMONIA in the last 168 hours. Coagulation Profile: No results for input(s): INR, PROTIME in the last 168 hours. Cardiac Enzymes: No results for input(s): CKTOTAL, CKMB, CKMBINDEX, TROPONINI in the last 168 hours. BNP (last 3 results) No results for input(s): PROBNP in the last 8760 hours. HbA1C: No results for input(s): HGBA1C in the last 72 hours. CBG: Recent Labs  Lab 09/09/18 1542 09/09/18 1932 09/09/18 2335 09/10/18 0323 09/10/18 0725  GLUCAP 245* 257* 297* 155* 176*   Lipid Profile: No results for input(s): CHOL, HDL, LDLCALC, TRIG, CHOLHDL, LDLDIRECT in the last 72 hours. Thyroid Function Tests: No results for input(s): TSH, T4TOTAL, FREET4, T3FREE, THYROIDAB in the last 72 hours. Anemia Panel: No results for input(s): VITAMINB12, FOLATE, FERRITIN, TIBC, IRON, RETICCTPCT in the last 72 hours. Urine analysis:    Component Value Date/Time   COLORURINE YELLOW 08/28/2018 1051   APPEARANCEUR CLEAR 08/28/2018 1051   LABSPEC 1.015 08/28/2018 1051   PHURINE 6.0 08/28/2018 1051   GLUCOSEU 150 (A) 08/28/2018 1051   HGBUR NEGATIVE 08/28/2018 1051    BILIRUBINUR NEGATIVE 08/28/2018 1051   KETONESUR NEGATIVE 08/28/2018 1051   PROTEINUR NEGATIVE 08/28/2018 1051   NITRITE NEGATIVE 08/28/2018 1051   LEUKOCYTESUR NEGATIVE 08/28/2018 1051   Sepsis Labs: @LABRCNTIP (procalcitonin:4,lacticidven:4) ) Recent Results (from the past 240 hour(s))  Respiratory Panel by PCR     Status: None   Collection Time: 09/01/18 11:46 AM  Result Value Ref Range Status   Adenovirus NOT DETECTED NOT DETECTED Final   Coronavirus 229E NOT DETECTED NOT DETECTED Final   Coronavirus HKU1 NOT DETECTED NOT DETECTED Final   Coronavirus NL63 NOT DETECTED NOT DETECTED Final   Coronavirus OC43 NOT DETECTED NOT DETECTED Final   Metapneumovirus NOT DETECTED NOT DETECTED Final   Rhinovirus / Enterovirus NOT DETECTED NOT DETECTED Final   Influenza A NOT DETECTED NOT DETECTED Final   Influenza B NOT DETECTED NOT DETECTED Final   Parainfluenza Virus 1 NOT DETECTED NOT DETECTED Final   Parainfluenza Virus 2 NOT DETECTED NOT DETECTED Final   Parainfluenza Virus 3 NOT DETECTED NOT DETECTED Final   Parainfluenza Virus 4 NOT DETECTED NOT DETECTED Final   Respiratory Syncytial Virus NOT DETECTED NOT DETECTED Final   Bordetella pertussis NOT DETECTED NOT DETECTED Final   Chlamydophila pneumoniae NOT DETECTED NOT DETECTED Final   Mycoplasma pneumoniae NOT DETECTED NOT DETECTED Final    Comment: Performed at [facility], [place] [address], [place], [place] [address]         Radiology Studies: Dg Chest Port 1 View  Result Date: 09/10/2018 CLINICAL DATA:  Shortness of breath EXAM: PORTABLE CHEST 1 VIEW COMPARISON:  September 09, 2018 and September 08, 2018 FINDINGS: Port-A-Cath tip is at the cavoatrial junction. No pneumothorax. There is patchy atelectatic change in the lower lobes, stable on the left with slight clearing from the right base. Slight increased opacity in the right apex is probably due to patient rotation as opposed to an actual lesion in this area. Lungs  elsewhere clear. Heart size and pulmonary vascular normal. No adenopathy. There is aortic atherosclerosis. No evident bone lesions. IMPRESSION: Bibasilar atelectasis, more on the left than on the right. There has been slight clearing from the  right base with no change on the left. Slight increased opacity in the right apex region is probably due to rotation, although particular attention this area on subsequent evaluations is warranted. Stable cardiac silhouette. There is aortic atherosclerosis. No change in Port-A-Cath position. Aortic Atherosclerosis (ICD10-I70.0). Electronically Signed   By: Lowella Grip III M.D.   On: 09/10/2018 07:51   Dg Chest Port 1 View  Result Date: 09/09/2018 CLINICAL DATA:  Shortness of breath. EXAM: PORTABLE CHEST 1 VIEW COMPARISON:  Radiograph of September 08, 2018. FINDINGS: The heart size and mediastinal contours are within normal limits. No pneumothorax or pleural effusion is noted. Right internal jugular Port-A-Cath is noted with distal tip in expected position of cavoatrial junction. Stable right basilar atelectasis or infiltrate is noted. Stable left lingular subsegmental atelectasis or infiltrate is noted. The visualized skeletal structures are unremarkable. IMPRESSION: Stable bilateral lung opacities as described above. Electronically Signed   By: Marijo Conception, M.D.   On: 09/09/2018 07:47   Dg Swallowing Func-speech Pathology  Result Date: 09/09/2018 Objective Swallowing Evaluation: Type of Study: MBS-Modified Barium Swallow Study  Patient Details Name: HADY NIEMCZYK MRN: [id number] Date of Birth: 1956-07-30 Today's Date: 09/09/2018 Time: SLP Start Time (ACUTE ONLY): 0840 -SLP Stop Time (ACUTE ONLY): 0909 SLP Time Calculation (min) (ACUTE ONLY): 29 min Past Medical History: Past Medical History: Diagnosis Date . Acute MI ([place]) 2014  acute ST elevation MI . Anemia  . Colon cancer ([place]) 2016  Status post resection of colon mass is well as he panic metastasis.  .  Coronary artery disease  . Diabetes mellitus without complication ([place])  . Enlarged prostate   slightly and takes Flomax daily . Heart disease  . History of blood transfusion  . Hyperlipidemia   takes Lipitor daily . Hypertension   takes Lisinopril daily . Lung nodule   left . Nocturia  . Numbness   left foot . Pancreatitis  . Peripheral neuropathy  Past Surgical History: Past Surgical History: Procedure Laterality Date . 1/8 of liver removed   . APPENDECTOMY   . APPLICATION OF CRANIAL NAVIGATION Left 5/78/4696  Procedure: APPLICATION OF CRANIAL NAVIGATION;  Surgeon: Erline Levine, MD;  Location: [place];  Service: Neurosurgery;  Laterality: Left; . BIOPSY  05/22/2018  Procedure: BIOPSY;  Surgeon: Carol Ada, MD;  Location: [facility];  Service: Endoscopy;; . BRAIN SURGERY  06/04/2018 . COLONOSCOPY   . CORONARY ANGIOPLASTY    1 stent . CORONARY STENT PLACEMENT  06-27-2013 . CRANIOTOMY Left 06/04/2018  Procedure: CRANIOTOMY TUMOR EXCISION with Brainlab;  Surgeon: Erline Levine, MD;  Location: [place];  Service: Neurosurgery;  Laterality: Left;  CRANIOTOMY TUMOR EXCISION with Brainlab . ESOPHAGOGASTRODUODENOSCOPY Left 05/22/2018  Procedure: ESOPHAGOGASTRODUODENOSCOPY (EGD);  Surgeon: Carol Ada, MD;  Location: [facility];  Service: Endoscopy;  Laterality: Left; . HERNIA REPAIR Left 1991 . IR IMAGING GUIDED PORT INSERTION  07/18/2018 . LAPAROSCOPIC RIGHT COLECTOMY  2016  [place] . LIVER LOBECTOMY Right 09/28/2015   Right partial hepatectomy at [facility] . LUNG BIOPSY Left 11/28/2016  Procedure: LUNG BIOPSY, left upper lobe;  Surgeon: [name], MD;  Location: [place];  Service: Thoracic;  Laterality: Left; Marland Kitchen VIDEO BRONCHOSCOPY WITH ENDOBRONCHIAL NAVIGATION N/A 11/28/2016  Procedure: VIDEO BRONCHOSCOPY WITH ENDOBRONCHIAL NAVIGATION;  Surgeon: [name], MD;  Location: [place];  Service: Thoracic;  Laterality: N/A; . VIDEO BRONCHOSCOPY WITH ENDOBRONCHIAL ULTRASOUND N/A 11/28/2016  Procedure: VIDEO BRONCHOSCOPY  WITH ENDOBRONCHIAL ULTRASOUND;  Surgeon: [name], MD;  Location: [place];  Service:  Thoracic;  Laterality: N/A; HPI: 62 yo male adm to [facility] with hypoxia, increased lactic acid, febrile. 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  .  Pt has required Bipap on and off but currently is on 8 L HFNC.  He desires to eat.  concern for possible ARDS noted - pt with severe pna - right more than left patchy infiltrate.  WBC increasing from 5.7 to 11.5.  He has been made a DNR during this hospital coarse.   Pt's son and daughter present during eval and state pt wants to eat.   Subjective: pt awake in bed, sleepy but participative Assessment / Plan / Recommendation CHL IP CLINICAL IMPRESSIONS 09/09/2018 Clinical Impression Pt presents with moderately severe oral and mild pharyngeal dysphagia.  Decreased oral bolus control/organization and transiting noted due to weakness.  Pt frequently spills boluses into pharynx poorly controlled and oral residuals present.  He intermittently conducts dry swallow to clear oral cavity- at times requiring verbal cue.  Pharyngeal swallow is strong when triggered.  Secretions noted in pharynx which pt does not sense= nor did he clear with intake.  SLP challenged pt during MBS by providing him with 3 boluses of applesauce to swallow to assess for fatigue.  At end of MBS, pt did present with minimal residuals in pharynx without awareness, cued dry swallow effective to clear.  Educated pt and wife to findings/recommendations and concerns for pt to not receive adequate nutrition due to current level of dysphagia.  Although he did not aspirate on this MBS, he will continue to be high risk due to oral deficits and gross weakness.  Dys1 (extra creamy) and thin would be least restrictive diet for him with strict precautions.  Educated pt/wife to findings/recommendations and provided swallow signs to RN.  Will  follow up briefly for tolerance.   SLP Visit Diagnosis Dysphagia, oropharyngeal phase (R13.12);Dysphagia, unspecified (R13.10) Attention and concentration deficit following -- Frontal lobe and executive function deficit following -- Impact on safety and function Moderate aspiration risk   CHL IP TREATMENT RECOMMENDATION 09/09/2018 Treatment Recommendations Therapy as outlined in treatment plan below   Prognosis 09/09/2018 Prognosis for Safe Diet Advancement Fair Barriers to Reach Goals Severity of deficits;Other (Comment) Barriers/Prognosis Comment -- CHL IP DIET RECOMMENDATION 09/09/2018 SLP Diet Recommendations Dysphagia 1 (Puree) solids;Thin liquid Liquid Administration via Cup;Straw Medication Administration Crushed with puree Compensations Minimize environmental distractions;Slow rate;Small sips/bites;Other (Comment) Postural Changes Remain semi-upright after after feeds/meals (Comment);Seated upright at 90 degrees   CHL IP OTHER RECOMMENDATIONS 09/09/2018 Recommended Consults -- Oral Care Recommendations (No Data) Other Recommendations Have oral suction available   CHL IP FOLLOW UP RECOMMENDATIONS 09/09/2018 Follow up Recommendations (No Data)   CHL IP FREQUENCY AND DURATION 09/09/2018 Speech Therapy Frequency (ACUTE ONLY) min 1 x/week Treatment Duration 1 week      CHL IP ORAL PHASE 09/09/2018 Oral Phase Impaired Oral - Pudding Teaspoon -- Oral - Pudding Cup -- Oral - Honey Teaspoon -- Oral - Honey Cup -- Oral - Nectar Teaspoon -- Oral - Nectar Cup -- Oral - Nectar Straw Delayed oral transit;Decreased bolus cohesion;Lingual/palatal residue;Premature spillage;Weak lingual manipulation;Lingual pumping;Reduced posterior propulsion Oral - Thin Teaspoon Delayed oral transit;Decreased bolus cohesion;Lingual/palatal residue;Premature spillage;Weak lingual manipulation;Reduced posterior propulsion Oral - Thin Cup Delayed oral transit;Decreased bolus cohesion;Lingual/palatal residue;Premature spillage;Weak lingual  manipulation;Lingual pumping;Reduced posterior propulsion Oral - Thin Straw Delayed oral transit;Decreased bolus cohesion;Lingual/palatal residue;Premature spillage;Weak lingual manipulation;Lingual pumping Oral - Puree  Delayed oral transit;Premature spillage;Weak lingual manipulation;Lingual/palatal residue;NT;Reduced posterior propulsion Oral - Mech Soft Delayed oral transit;Premature spillage;Weak lingual manipulation;Lingual/palatal residue;Lingual pumping;Reduced posterior propulsion;Impaired mastication Oral - Regular -- Oral - Multi-Consistency -- Oral - Pill Delayed oral transit;Premature spillage;Weak lingual manipulation;Lingual/palatal residue;Lingual pumping;Reduced posterior propulsion Oral Phase - Comment delayed 16 seconds to masticate a cracker bolus and transit, minimal mastication;  delay with puree 6-7 seconds oral transiting, premature spillage with poor control noted, oral residuals present without inconsistent awareness  CHL IP PHARYNGEAL PHASE 09/09/2018 Pharyngeal Phase Impaired Pharyngeal- Pudding Teaspoon -- Pharyngeal -- Pharyngeal- Pudding Cup -- Pharyngeal -- Pharyngeal- Honey Teaspoon -- Pharyngeal -- Pharyngeal- Honey Cup -- Pharyngeal -- Pharyngeal- Nectar Teaspoon -- Pharyngeal -- Pharyngeal- Nectar Cup -- Pharyngeal -- Pharyngeal- Nectar Straw WFL Pharyngeal -- Pharyngeal- Thin Teaspoon -- Pharyngeal -- Pharyngeal- Thin Cup WFL Pharyngeal -- Pharyngeal- Thin Straw WFL;Delayed swallow initiation-pyriform sinuses;Pharyngeal residue - valleculae;Pharyngeal residue - pyriform Pharyngeal -- Pharyngeal- Puree WFL Pharyngeal -- Pharyngeal- Mechanical Soft WFL Pharyngeal -- Pharyngeal- Regular -- Pharyngeal -- Pharyngeal- Multi-consistency -- Pharyngeal -- Pharyngeal- Pill WFL Pharyngeal -- Pharyngeal Comment --  CHL IP CERVICAL ESOPHAGEAL PHASE 09/09/2018 Cervical Esophageal Phase Impaired Pudding Teaspoon -- Pudding Cup -- Honey Teaspoon -- Honey Cup -- Nectar Teaspoon -- Nectar Cup --  Nectar Straw -- Thin Teaspoon -- Thin Cup -- Thin Straw -- Puree -- Mechanical Soft -- Regular -- Multi-consistency -- Pill -- Cervical Esophageal Comment upon esophageal sweep after barium tablet administered with pudding, pt appeared with stasis in esophagus WITHOUT awareness, liquid bolus helpful to clear Macario Golds 09/09/2018, 10:04 AM    Luanna Salk, MS [facility] Pager [phone] Office [phone]              Scheduled Meds: . bisacodyl  10 mg Rectal Once  . chlorhexidine  15 mL Mouth Rinse BID  . Chlorhexidine Gluconate Cloth  6 each Topical Daily  . furosemide  40 mg Intravenous Daily  . gabapentin  300 mg Oral TID  . insulin aspart  0-15 Units Subcutaneous Q4H  . insulin glargine  33 Units Subcutaneous QHS  . LORazepam  1 mg Intravenous QHS  . mouth rinse  15 mL Mouth Rinse q12n4p  . methylPREDNISolone (SOLU-MEDROL) injection  60 mg Intravenous Q12H  . morphine  15 mg Oral Daily  . multivitamin  15 mL Oral Daily  . nystatin  5 mL Oral QID  . pantoprazole (PROTONIX) IV  40 mg Intravenous Q12H  . polyvinyl alcohol  1 drop Both Eyes TID  . protein supplement shake  11 oz Oral TID BM  . sodium chloride flush  3 mL Intravenous Q12H   Continuous Infusions: . sodium chloride Stopped (08/30/18 0808)  . sodium chloride 10 mL/hr at 09/09/18 1659  . levofloxacin (LEVAQUIN) IV Stopped (09/09/18 1622)  . sulfamethoxazole-trimethoprim 420 mg (09/10/18 0513)  . vancomycin 1,000 mg (09/10/18 0938)     LOS: 13 days    Time spent in minutes: 40    Debbe Odea, MD [facility] Pager: [email or web address] Password [facility] 09/10/2018, 10:46 AM

## 2018-09-10 NOTE — Progress Notes (Signed)
Removed PT from BiPAP and placed PT on 6 lpm 02. Please provide Spo2 goal.

## 2018-09-10 NOTE — Progress Notes (Addendum)
Patient ID: Allen Glenn, male   DOB: 06/10/1956, 62 y.o.   MRN: [id number]         [facility] for Infectious Disease    Date of Admission:  08/28/2018    Day 14 vancomycin                                                                                     Day 13 levofloxacin                                                                                     Day 9 trimethoprim sulfamethoxazole  [name] is sitting up in a chair visiting with family.  He has nasal prong oxygen on.  His chest x-ray is improving.  We will stop vancomycin today and levofloxacin tomorrow.  Continuing empiric trimethoprim sulfamethoxazole with a tapering course of steroids for a full 21 days.         Michel Bickers, MD [facility] [place] [facility] [phone] pager   [phone] cell 09/10/2018, 4:18 PM

## 2018-09-11 DIAGNOSIS — E871 Hypo-osmolality and hyponatremia: Secondary | ICD-10-CM

## 2018-09-11 DIAGNOSIS — E875 Hyperkalemia: Secondary | ICD-10-CM

## 2018-09-11 DIAGNOSIS — R000 Tachycardia, unspecified: Secondary | ICD-10-CM

## 2018-09-11 LAB — CBC
HEMATOCRIT: 28.9 % — AB (ref 39.0–52.0)
HEMOGLOBIN: 9.5 g/dL — AB (ref 13.0–17.0)
MCH: 30.8 pg (ref 26.0–34.0)
MCHC: 32.9 g/dL (ref 30.0–36.0)
MCV: 93.8 fL (ref 78.0–100.0)
Platelets: 87 10*3/uL — ABNORMAL LOW (ref 150–400)
RBC: 3.08 MIL/uL — AB (ref 4.22–5.81)
RDW: 18.5 % — ABNORMAL HIGH (ref 11.5–15.5)
WBC: 12.4 10*3/uL — ABNORMAL HIGH (ref 4.0–10.5)

## 2018-09-11 LAB — BASIC METABOLIC PANEL
ANION GAP: 16 — AB (ref 5–15)
Anion gap: 16 — ABNORMAL HIGH (ref 5–15)
BUN: 17 mg/dL (ref 8–23)
BUN: 17 mg/dL (ref 8–23)
CHLORIDE: 90 mmol/L — AB (ref 98–111)
CO2: 20 mmol/L — AB (ref 22–32)
CO2: 21 mmol/L — AB (ref 22–32)
CREATININE: 0.53 mg/dL — AB (ref 0.61–1.24)
Calcium: 8.4 mg/dL — ABNORMAL LOW (ref 8.9–10.3)
Calcium: 8.4 mg/dL — ABNORMAL LOW (ref 8.9–10.3)
Chloride: 91 mmol/L — ABNORMAL LOW (ref 98–111)
Creatinine, Ser: 0.52 mg/dL — ABNORMAL LOW (ref 0.61–1.24)
GFR calc non Af Amer: >60 mL/min (ref >60)
GFR calc non Af Amer: >60 mL/min (ref >60)
GLUCOSE: 243 mg/dL — AB (ref 70–99)
Glucose, Bld: 302 mg/dL — ABNORMAL HIGH (ref 70–99)
POTASSIUM: 5 mmol/L (ref 3.5–5.1)
Potassium: 5.3 mmol/L — ABNORMAL HIGH (ref 3.5–5.1)
SODIUM: 127 mmol/L — AB (ref 135–145)
Sodium: 127 mmol/L — ABNORMAL LOW (ref 135–145)

## 2018-09-11 LAB — GLUCOSE, CAPILLARY
GLUCOSE-CAPILLARY: 207 mg/dL — AB (ref 70–99)
GLUCOSE-CAPILLARY: 201 mg/dL — AB (ref 70–99)
GLUCOSE-CAPILLARY: 278 mg/dL — AB (ref 70–99)
Glucose-Capillary: 196 mg/dL — ABNORMAL HIGH (ref 70–99)
Glucose-Capillary: 198 mg/dL — ABNORMAL HIGH (ref 70–99)
Glucose-Capillary: 289 mg/dL — ABNORMAL HIGH (ref 70–99)
Glucose-Capillary: 292 mg/dL — ABNORMAL HIGH (ref 70–99)

## 2018-09-11 MED ORDER — PATIROMER SORBITEX CALCIUM 8.4 G PO PACK
8.4000 g | PACK | Freq: Once | ORAL | Status: AC
  Administered 2018-09-11: 8.4 g via ORAL
  Filled 2018-09-11: qty 1

## 2018-09-11 MED ORDER — GABAPENTIN 300 MG PO CAPS
300.0000 mg | ORAL_CAPSULE | Freq: Three times a day (TID) | ORAL | Status: DC
  Administered 2018-09-11: 300 mg via ORAL
  Filled 2018-09-11: qty 1

## 2018-09-11 MED ORDER — NYSTATIN 100000 UNIT/ML MT SUSP
5.0000 mL | Freq: Four times a day (QID) | OROMUCOSAL | Status: DC
  Administered 2018-09-11 – 2018-09-22 (×30): 500000 [IU] via ORAL
  Filled 2018-09-11 (×36): qty 5

## 2018-09-11 NOTE — Progress Notes (Signed)
Pharmacy Antibiotic Note  Allen Glenn is a 62 y.o. male with metastatic colon cancer undergoing chemotherapy treatment, presented to the ED on with sepsis. ID currently seeing patient.  Fluconazole, Vancomycin, and Levaquin courses have been completed.  He's currently on Bactrim for PCP coverage.    Today, 09/11/2018: - day #10 Sulfamethoxazole-trimethoprim - afebrile, wbc 14.1 (steroids), scr low/stable at 0.53 - monitor closely for hyperkalemia:  K 5 (9/18), K 5.3 (9/19)  Plan:  Sulfamethoxazole-trimethoprim 420 mg (~ 15 mg/kg/day) IV q8h  ID planning to treat for 3 weeks; ID to consider changing from IV to PO when adequately tolerating PO intake safely. Follow up renal fxn, culture results, and clinical course. Give Veltassa 8.4g PO x1 dose for hyperkalemia and recheck AM BMET.  __________________________________ Height: 6\' 2"  (188 cm) Weight: 189 lb 13.1 oz (86.1 kg) IBW/kg (Calculated) : 82.2  Temp (24hrs), Avg:97 F (36.1 C), Min:96.5 F (35.8 C), Max:97.3 F (36.3 C)  Recent Labs  Lab 09/05/18 0926 09/05/18 1300 09/06/18 0443 09/07/18 0430 09/08/18 0348 09/09/18 0330 09/10/18 0529  WBC  --   --  9.2 11.4* 11.5* 11.8* 14.1*  CREATININE  --   --  0.41* 0.46* 0.43* 0.42* 0.42*  VANCOTROUGH 15  --   --   --   --   --   --   VANCOPEAK  --  37  --   --   --   --   --     Estimated Creatinine Clearance: 112.7 mL/min (A) (by C-G formula based on SCr of 0.42 mg/dL (L)).    No Known Allergies   Antimicrobials this admission: 9/5 Vancomycin >> 9/7, resumed 9/8 >> 9/19 9/5 Zosyn >> 9/8 9/6 Levofloxacin >> 9/19 9/8 Meropenem >> 9/9 9/9 Diflucan >> 9/17 9/9 Bactrim >>   Dose adjustments this admission: 9/13 VT at 0926=15         Vpk at 1300 = 37 Ke 0.1062, t/12 6.5 hr, Cmax 43.4, Cmin 14.4. Vd 37 L, CL 65.4. Change to Vanc 1 gm q12 for AUC 509, Cmax 35.6 Cmin 11.1   Microbiology results: 9/4 BCx (at [facility]): NGF 9/5 MRSA PCR: negative  9/5 BCx x2: NGF 9/6 Urine  legionella Ag: negative 9/6 Urine strep pneumo Ag: neg 9/9 Respiratory virus panel: negative  Thank you for allowing pharmacy to be a part of this patient's care.  Gretta Arab PharmD, BCPS Pager [phone] 09/11/2018 9:40 AM

## 2018-09-11 NOTE — Progress Notes (Signed)
PT Cancellation Note  Patient Details Name: Allen Glenn MRN: [id number] DOB: [birth year]   Cancelled Treatment:    Reason Eval/Treat Not Completed: PT screened, no needs identified, will sign off Discussed pt's mobility with RN.  RN reports pt requires significant assist for rolling.  Pt has been OOB to recliner with lift.  RN also reports pt too weak to move UE or LEs actively.  Palliative care involved with pt and recent note stated "ensuring decisions are within the patient's best interest and GOCs"  Pt does not appear appropriate to tolerate skilled physical therapy at this time.  Possible hospice home vs residential in notes.  Would encourage further palliative care discussion with pt and family.  If pt improves and able to start actively assisting with bed mobility and movement, please reorder.  Recommend pt OOB with lift at this time.   Natalin Bible,KATHrine E 09/11/2018, 11:26 AM Carmelia Bake, PT, DPT Acute Rehabilitation Services Office: [phone] Pager: [phone]

## 2018-09-11 NOTE — Consult Note (Addendum)
Allen Glenn  MRN: [id number]  DOB: [birth year]  DOA: 08/28/2018 Date of Consult: 9/.9/19  LOS 14 days  PCP: [name], MD   Reason for Consult / Chief Complaint:  Acute respiratory failure with infiltrates  Consulting MD:  Dr Quincy Simmonds and Dr Benay Spice oncology  HPI/Brief Narrative   Metastatic colon cancer with metastatic disease to the bone, liver, lungs and brain: The patient is followed by Dr. Dominica Severin B. Sherrill and is status post cycle 1, day 1 of FOLFIRI which was dosed on 08/19/2018. Also has CAD, lipid, BP. Prestned to ER 08/28/2018 with  Sepsis syndrom (103F), and SIRS tachycardia and tachypnea x 3 days and occult sepsi with lactate 4 with pancytopenia. . CTA the day prior was without PE of infiltrates but did have metastatic lesions. Rx with broad abx (zosyn, vanc and levoflox) and by 08/30/18 beginning to feel better and started on Granix. On 08/31/18 noted to be more tachypneic and Rx bipap though wbc/anc improving after granix. CT chest 08/30/18 showed new multi-focal infitlrates associatd with high procalcitionin levels. On 09/01/18 AM - CCM consulted for respiratory failure associated with hypoxemia and infiltrates in immune suppressed patient. 09/11/2018 pulmonary critical care signed off. 09/11/2018 pulmonary critical care asked to review case.     has a past medical history of Acute MI ([place]) (2014), Anemia, Colon cancer ([place]) (2016), Coronary artery disease, Diabetes mellitus without complication ([place]), Enlarged prostate, Heart disease, History of blood transfusion, Hyperlipidemia, Hypertension, Lung nodule, Nocturia, Numbness, Pancreatitis, and Peripheral neuropathy.   reports that he has never smoked. He has never used smokeless tobacco.  Past Surgical History:  Procedure Laterality Date  . 1/8 of liver removed    . APPENDECTOMY    . APPLICATION OF CRANIAL NAVIGATION Left 06/04/2018   Procedure: APPLICATION OF CRANIAL NAVIGATION;  Surgeon: Erline Levine, MD;  Location: [place];   Service: Neurosurgery;  Laterality: Left;  . BIOPSY  05/22/2018   Procedure: BIOPSY;  Surgeon: Carol Ada, MD;  Location: [facility];  Service: Endoscopy;;  . BRAIN SURGERY  06/04/2018  . COLONOSCOPY    . CORONARY ANGIOPLASTY     1 stent  . CORONARY STENT PLACEMENT  06-27-2013  . CRANIOTOMY Left 06/04/2018   Procedure: CRANIOTOMY TUMOR EXCISION with Brainlab;  Surgeon: Erline Levine, MD;  Location: [place];  Service: Neurosurgery;  Laterality: Left;  CRANIOTOMY TUMOR EXCISION with Brainlab  . ESOPHAGOGASTRODUODENOSCOPY Left 05/22/2018   Procedure: ESOPHAGOGASTRODUODENOSCOPY (EGD);  Surgeon: Carol Ada, MD;  Location: [facility];  Service: Endoscopy;  Laterality: Left;  . HERNIA REPAIR Left 1991  . IR IMAGING GUIDED PORT INSERTION  07/18/2018  . LAPAROSCOPIC RIGHT COLECTOMY  2016   [place]  . LIVER LOBECTOMY Right 09/28/2015    Right partial hepate[facility] at [facility]  . LUNG BIOPSY Left 11/28/2016   Procedure: LUNG BIOPSY, left upper lobe;  Surgeon: [name], MD;  Location: [place];  Service: Thoracic;  Laterality: Left;  Marland Kitchen VIDEO BRONCHOSCOPY WITH ENDOBRONCHIAL NAVIGATION N/A 11/28/2016   Procedure: VIDEO BRONCHOSCOPY WITH ENDOBRONCHIAL NAVIGATION;  Surgeon: [name], MD;  Location: [place];  Service: Thoracic;  Laterality: N/A;  . VIDEO BRONCHOSCOPY WITH ENDOBRONCHIAL ULTRASOUND N/A 11/28/2016   Procedure: VIDEO BRONCHOSCOPY WITH ENDOBRONCHIAL ULTRASOUND;  Surgeon: [name], MD;  Location: [facility] OR;  Service: Thoracic;  Laterality: N/A;    No Known Allergies   There is no immunization history on file for this patient.  Family History  Problem Relation Age of Onset  .  Hypertension Mother   . Heart disease Mother   . Hypertension Father   . Heart disease Father   . Neuropathy Neg Hx      Current Facility-Administered Medications:  .  0.9 %  sodium chloride infusion, 250 mL, Intravenous, PRN, Roxan Hockey, MD, Stopped at 08/30/18 4098 .  0.9 %  sodium  chloride infusion, , Intravenous, PRN, Brunetta Genera, MD, Stopped at 09/09/18 1732 .  acetaminophen (TYLENOL) tablet 650 mg, 650 mg, Oral, Q6H PRN, 650 mg at 08/31/18 1351 **OR** acetaminophen (TYLENOL) suppository 650 mg, 650 mg, Rectal, Q6H PRN, Denton Brick, Courage, MD, 650 mg at 09/02/18 0410 .  bisacodyl (DULCOLAX) suppository 10 mg, 10 mg, Rectal, Once, Sheikh, Omair Latif, DO .  chlorhexidine (PERIDEX) 0.12 % solution 15 mL, 15 mL, Mouth Rinse, BID, Sheikh, Omair Latif, DO, 15 mL at 09/11/18 0824 .  Chlorhexidine Gluconate Cloth 2 % PADS 6 each, 6 each, Topical, Daily, Denton Brick, Courage, MD, 6 each at 09/11/18 0826 .  diphenoxylate-atropine (LOMOTIL) 2.5-0.025 MG per tablet 2 tablet, 2 tablet, Oral, QID PRN, Emokpae, Courage, MD .  gabapentin (NEURONTIN) capsule 300 mg, 300 mg, Oral, TID, Emokpae, Courage, MD, 300 mg at 09/11/18 0824 .  insulin aspart (novoLOG) injection 0-15 Units, 0-15 Units, Subcutaneous, TID WC, Debbe Odea, MD, 3 Units at 09/11/18 0824 .  insulin glargine (LANTUS) injection 33 Units, 33 Units, Subcutaneous, QHS, [place], [place], [place] Units at 09/10/18 2140 .  levalbuterol (XOPENEX) nebulizer solution 1.25 mg, 1.25 mg, Nebulization, Q4H PRN, Patrecia Pour, Christean Grief, MD, 1.25 mg at 08/30/18 1191 .  levofloxacin (LEVAQUIN) IVPB 750 mg, 750 mg, Intravenous, Q24H, Michel Bickers, MD, Stopped at 09/10/18 1632 .  LORazepam (ATIVAN) injection 1 mg, 1 mg, Intravenous, QHS, Patrecia Pour, Edwin, MD, 1 mg at 09/10/18 2112 .  magic mouthwash, 5 mL, Oral, QID PRN, Denton Brick, Courage, MD, 5 mL at 08/31/18 0739 .  MEDLINE mouth rinse, 15 mL, Mouth Rinse, q12n4p, Sheikh, Omair Latif, DO, 15 mL at 09/10/18 1836 .  methylPREDNISolone sodium succinate (SOLU-MEDROL) 125 mg/2 mL injection 60 mg, 60 mg, Intravenous, Q12H, Sheikh, Omair Latif, DO, 60 mg at 09/11/18 0300 .  metoCLOPramide (REGLAN) injection 10 mg, 10 mg, Intravenous, Q12H PRN, Sheikh, Omair Latif, DO .  morphine (MS CONTIN)  12 hr tablet 15 mg, 15 mg, Oral, Daily, Emokpae, Courage, MD, 15 mg at 09/10/18 0940 .  morphine 2 MG/ML injection 2-4 mg, 2-4 mg, Intravenous, Q1H PRN, Patrecia Pour, Christean Grief, MD, 2 mg at 09/09/18 0225 .  multivitamin liquid 15 mL, 15 mL, Oral, Daily, Sheikh, Omair Latif, DO, 15 mL at 09/11/18 0825 .  nitroGLYCERIN (NITROSTAT) SL tablet 0.4 mg, 0.4 mg, Sublingual, Q5 min PRN, Emokpae, Courage, MD .  nystatin (MYCOSTATIN) 100000 UNIT/ML suspension 500,000 Units, 5 mL, Oral, QID, Brunetta Genera, MD, 500,000 Units at 09/11/18 0825 .  ondansetron (ZOFRAN) tablet 4 mg, 4 mg, Oral, Q6H PRN **OR** ondansetron (ZOFRAN) injection 4 mg, 4 mg, Intravenous, Q6H PRN, Emokpae, Courage, MD .  pantoprazole (PROTONIX) injection 40 mg, 40 mg, Intravenous, Daily, Rizwan, Saima, MD, 40 mg at 09/11/18 0825 .  polyethylene glycol (MIRALAX / GLYCOLAX) packet 17 g, 17 g, Oral, Daily PRN, Emokpae, Courage, MD .  polyvinyl alcohol (LIQUIFILM TEARS) 1.4 % ophthalmic solution 1 drop, 1 drop, Both Eyes, TID, Patrecia Pour, Edwin, MD, 1 drop at 09/09/18 1019 .  protein supplement (PREMIER PROTEIN) liquid - approved for s/p bariatric surgery, 11 oz, Oral, TID BM, Patrecia Pour, Christean Grief, MD, 11  oz at 09/11/18 0826 .  sodium chloride flush (NS) 0.9 % injection 10-40 mL, 10-40 mL, Intracatheter, PRN, Emokpae, Courage, MD .  sodium chloride flush (NS) 0.9 % injection 3 mL, 3 mL, Intravenous, Q12H, Emokpae, Courage, MD, 3 mL at 09/11/18 0826 .  sodium chloride flush (NS) 0.9 % injection 3 mL, 3 mL, Intravenous, PRN, Emokpae, Courage, MD .  sulfamethoxazole-trimethoprim (BACTRIM) 420 mg in dextrose 5 % 500 mL IVPB, 420 mg, Intravenous, Q8H, Emiliano Dyer, RPH, Stopped at 09/11/18 3729 .  traZODone (DESYREL) tablet 50 mg, 50 mg, Oral, QHS PRN, Denton Brick, Courage, MD, 50 mg at 09/01/18 2153    EVENTS   08/28/2018 -0 admit 09/01/18  - ccm consult 09/02/2018 pulmonary critical care signed off 9 08/11/2018 pulmonary critical care asked  to review case for [facility].  Pulmonary critical care will be available as needed  SUBJECTIVE/OVERNIGHT/INTERVAL HX   09/11/2018 = currently on nasal cannula no acute distress tolerated BiPAP over the night.   Objective    Vitals:   09/11/18 0400 09/11/18 0500 09/11/18 0600 09/11/18 0722  BP: 122/72     Pulse: 97 93 91   Resp: 13 14 14    Temp:    (!) 96.8 F (36 C)  TempSrc:    Axillary  SpO2: 99% 99% 100%   Weight:      Height:        Filed Weights   08/28/18 1400 09/09/18 1128  Weight: 84.2 kg 86.1 kg         Physical Exam: General: Frail male who appears in stated HEENT: Cushingoid appearance, oropharynx is unremarkable Neuro: Follows commands moves all extremities is extremely dull afebrile CV: s1s2 rrr, no m/r/g PULM: even/non-labored, lungs bilaterally decreased in bases MS:XJDB, non-tender, bsx4 active  Extremities: Lower extremity wasted in appearance Skin: no rashes or lesions   Labs   PULMONARY No results for input(s): PHART, PCO2ART, PO2ART, HCO3, TCO2, O2SAT in the last 168 hours.  Invalid input(s): PCO2, PO2  CBC Recent Labs  Lab 09/08/18 0348 09/09/18 0330 09/10/18 0529  HGB 8.4* 9.0* 9.3*  HCT 25.5* 27.1* 28.3*  WBC 11.5* 11.8* 14.1*  PLT 66* 72* 87*    COAGULATION No results for input(s): INR in the last 168 hours.  CARDIAC  No results for input(s): TROPONINI in the last 168 hours. No results for input(s): PROBNP in the last 168 hours.   CHEMISTRY Recent Labs  Lab 09/06/18 0443 09/07/18 0430 09/08/18 0348 09/09/18 0330 09/10/18 0529  NA 128* 131* 127* 128* 129*  K 4.1 4.4 4.8 4.2 5.0  CL 93* 96* 92* 93* 94*  CO2 22 23 23 23 23   GLUCOSE 231* 186* 145* 160* 154*  BUN 20 21 19 23  24*  CREATININE 0.41* 0.46* 0.43* 0.42* 0.42*  CALCIUM 7.8* 8.2* 7.9* 8.1* 8.4*  MG 2.4 2.5* 2.5* 2.6* 2.7*  PHOS 2.8 3.5 3.4 3.7 3.4   Estimated Creatinine Clearance: 112.7 mL/min (A) (by C-G formula based on SCr of 0.42 mg/dL  (L)).   LIVER Recent Labs  Lab 09/06/18 0443 09/07/18 0430 09/08/18 0348 09/09/18 0330 09/10/18 0529  AST 28 30 26 28 28   ALT 26 27 24 25 25   ALKPHOS 183* 196* 176* 187* 177*  BILITOT 0.3 0.4 0.2* 0.2* 0.4  PROT 4.9* 5.0* 4.9* 4.9* 5.1*  ALBUMIN 1.6* 1.9* 1.8* 2.1* 2.2*     INFECTIOUS No results for input(s): LATICACIDVEN, PROCALCITON in the last 168 hours.   ENDOCRINE CBG (last 3)  Recent Labs  09/11/18 0002 09/11/18 0329 09/11/18 0808  GLUCAP 289* 207* 196*         IMAGING x48h  - image(s) personally visualized  -   highlighted in bold Dg Chest Port 1 View  Result Date: 09/10/2018 CLINICAL DATA:  Shortness of breath EXAM: PORTABLE CHEST 1 VIEW COMPARISON:  September 09, 2018 and September 08, 2018 FINDINGS: Port-A-Cath tip is at the cavoatrial junction. No pneumothorax. There is patchy atelectatic change in the lower lobes, stable on the left with slight clearing from the right base. Slight increased opacity in the right apex is probably due to patient rotation as opposed to an actual lesion in this area. Lungs elsewhere clear. Heart size and pulmonary vascular normal. No adenopathy. There is aortic atherosclerosis. No evident bone lesions. IMPRESSION: Bibasilar atelectasis, more on the left than on the right. There has been slight clearing from the right base with no change on the left. Slight increased opacity in the right apex region is probably due to rotation, although particular attention this area on subsequent evaluations is warranted. Stable cardiac silhouette. There is aortic atherosclerosis. No change in Port-A-Cath position. Aortic Atherosclerosis (ICD10-I70.0). Electronically Signed   By: Lowella Grip III M.D.   On: 09/10/2018 07:51         Assessment & Plan:   Acute on chronic respiratory failure in the setting of pneumonia in immunocompromised patient. -Change BiPAP to as needed at this time.  Hold evening sedation if he does not go on  BiPAP. -Antibiotics per ID service -O2 as needed to maintain O2 sats greater than 92% -Pulmonary toilet as able -Mobilization as able -Steroid wean over 21 days.  Status post colon cancer with right colectomy without metastases to the lungs bone liver and brain. -Unable to tolerate chemotherapy at this time -Management by Dr. Wendee Copp oncology  Hypotension, resolved  Hyponatremia Recent Labs  Lab 09/08/18 0348 09/09/18 0330 09/10/18 0529  NA 127* 128* 129*  -Per [facility]  Insulin-dependent diabetes with steroid exacerbation -Per [facility]   General failure to thrive in the setting of recurrent cancer with metastasis is proven refractory to treatment. -Family is not quite ready to convert to full comfort measures at this time -Continue current treatment -Remains DNR -Very little for pulmonary critical care to offer at this time.     Best Practice / Goals of Care / Disposition.   DVT prophylaxis: per primary GI prophylaxis: per primary Diet: ok for light lunch but clears after that Mobility:bed rest Code Status: DNR Family Communication: Wife, son, daughter and patient updated 09/11/2018  Disposition / Summary of [place] 09/11/2018  Long discussion with wife and patient.  Although he is a DNR the wife is still hopeful that he will recover.   continue nocturnal noninvasive ventilation.  Have spoken with the pulmonary critical care physician and it is reasonable to try him off nocturnal BiPAP at this time.    Richardson Landry Eulah Walkup ACNP Maryanna Shape PCCM Pager [phone] till 1 pm If no answer page [phone] 09/11/2018, 9:29 AM

## 2018-09-11 NOTE — Progress Notes (Signed)
SLP Note  Patient Details Name: JAHMEZ BILY MRN: [id number] DOB: 09/08/1956    treatment:        SlP to sign off at this time as per chart, pt appears to be tolerating puree/thin despite high aspiration risk.  Family was educated extensively during [place].    Luanna Salk, MS [facility] SLP Acute Rehab Services Pager [phone] Office [phone]  Macario Golds 09/11/2018, 2:56 PM

## 2018-09-11 NOTE — Progress Notes (Signed)
Plan for trial off nocturnal BiPAP tonight per CCM, NP consult note from earlier today. However, an active nocturnal order remains. Spoke to patient wife who agrees that consulting NP spoke with her of holding BiPAP for tonight if tolerated and would like to continue with that recommendation. RN aware. RT will continue to follow.

## 2018-09-11 NOTE — Progress Notes (Addendum)
IP PROGRESS NOTE  Subjective:   Mr. Montelongo remains off of BiPAP.  The nasal cannula oxygen is being weaned.  His wife is at the bedside.  He feels "weak".  Objective: Vital signs in last 24 hours: Blood pressure 122/72, pulse 91, temperature (!) 96.8 F (36 C), temperature source Axillary, resp. rate 14, height 6' 2" (1.88 m), weight 189 lb 13.1 oz (86.1 kg), SpO2 100 %.  Intake/Output from previous day: 09/18 0701 - 09/19 0700 In: 3007.5 [I.V.:5.5; IV Piggyback:3002] Out: 1850 [Urine:1850]  Physical Exam:  HEENT: No thrush Lungs: Lungs clear anteriorly distress Cardiac: Regular rate and rhythm Abdomen: Nontender, no hepatosplenomegaly Extremities: No leg edema trace edema at the arms bilaterally Neurologic: Alert, moves all extremities, unable to bend knees independently  Portacath/PICC-without erythema  Lab Results: Recent Labs    09/09/18 0330 09/10/18 0529  WBC 11.8* 14.1*  HGB 9.0* 9.3*  HCT 27.1* 28.3*  PLT 72* 87*   BMET Recent Labs    09/09/18 0330 09/10/18 0529  NA 128* 129*  K 4.2 5.0  CL 93* 94*  CO2 23 23  GLUCOSE 160* 154*  BUN 23 24*  CREATININE 0.42* 0.42*  CALCIUM 8.1* 8.4*    Lab Results  Component Value Date   CEA1 71.41 (H) 08/19/2018    Studies/Results: Dg Chest Port 1 View  Result Date: 09/10/2018 CLINICAL DATA:  Shortness of breath EXAM: PORTABLE CHEST 1 VIEW COMPARISON:  September 09, 2018 and September 08, 2018 FINDINGS: Port-A-Cath tip is at the cavoatrial junction. No pneumothorax. There is patchy atelectatic change in the lower lobes, stable on the left with slight clearing from the right base. Slight increased opacity in the right apex is probably due to patient rotation as opposed to an actual lesion in this area. Lungs elsewhere clear. Heart size and pulmonary vascular normal. No adenopathy. There is aortic atherosclerosis. No evident bone lesions. IMPRESSION: Bibasilar atelectasis, more on the left than on the right. There has  been slight clearing from the right base with no change on the left. Slight increased opacity in the right apex region is probably due to rotation, although particular attention this area on subsequent evaluations is warranted. Stable cardiac silhouette. There is aortic atherosclerosis. No change in Port-A-Cath position. Aortic Atherosclerosis (ICD10-I70.0). Electronically Signed   By: Lowella Grip III M.D.   On: 09/10/2018 07:51    Medications: I have reviewed the patient's current medications.  Assessment/Plan:  1. 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 No BRAFmutation  Liver biopsy 05/04/2015-metastatic adenocarcinoma consistent with a colon primary ? Staging PET scan 06/08/2015-isolated segment 4A liver lesion ? Initiation of adjuvant FOLFOX 06/13/2015 ? Restaging CT 08/09/2015 revealed a slight decrease in a borderline ileocolic node, decrease in the hepatic dome metastasis, no new lesions ? Liver resection 09/28/2015-pathology consistent with metastatic colon cancer, negative margins ? Adjuvant FOLFOX resumed 11/08/2015, oxaliplatin eliminated beginning 11/22/2015 secondary to neuropathy. He completed adjuvant chemotherapy 02/16/2016 ? Restaging chest CT 11/08/2016, compared to 05/07/2016 revealed a new 9 mm left upper lobe nodule, stable 2.2 cm right hepatic lesion ? PET scan 11/19/2016 confirmed a hypermetabolic left upper lobe nodule, hypermetabolic left paratracheal and pericardiac lymph nodes, and hypermetabolism associated with the hypoattenuating lesion in the dome of the liver ? Status post EBUSbiopsies of the left lingula nodule and a level 4Lnode on 11/28/2016-no evidence of malignancy ? CT chest 02/11/2017-increase in size of the left  pulmonary nodule and epicardial lymph node ? Status post SBRT 2 the left lung nodule and  mediastinum completed 03/14/2017 ? CTs 07/01/2017-new 9 mm focus along the right liver capsule, stable left subcapsular liver lesion, radiation change at site of left upper lobe nodule, stable pericardiallymph node ? CTs 11/25/2017-new 5 mm lingular nodule, enlargement of capsular-based right liver lesion, new capsular based right liver lesion capsular lesion at the hepatic dome ? CTs 02/25/2018-enlargement of small lung nodules, liver lesions and small mesenteric lymph node ? CT abdomen/pelvis 05/15/2018-new lower lobe pulmonary nodules, enlargement of liver lesions and right lower quadrant soft tissue nodules ? 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 Brain MRI 05/26/2018-3.5 similar left posterior fossa mass, 4 additional subcentimeter enhancing brain lesions-3 cerebellar, 1 left thalamic ? SRS to all 5 brain lesions 06/02/2018 ? Surgical excision of left cerebellar lesion 06/04/2018, adenocarcinoma consistent with colorectal primary ? MRI lumbar spine 06/18/2018-extensive metastases to the lumbar spine, sacrum, T12, and pelvis, pathologic fracture of L3 ? Initiation of radiation from T12-S2 06/30/2018, completed 07/11/2018 ? Cycle 1 FOLFIRI 08/19/2018 2. Coronary artery disease status post a myocardial infarction in 2014  3. Hypertension  4. Hyperlipidemia  5. Diabetes  6. Admission 05/20/2018 with nausea/vomitingsecondary to a cerebellar metastasis  7.Oral candidiasis 06/11/2018-started on Mycelex troches  8. 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  Admission 08/28/2018 with a febrile illness, likely pneumonia  10.  Anemia/thrombocytopenia secondary to chemotherapy and infection-improved  11.  Respiratory failure secondary to pneumonia,?  ARDS-maintained on steroids and Bactrim  Mr. Roza is improved from a respiratory standpoint.  He is very  deconditioned.  I discussed the prognosis and disposition options with Mr. Grotz and his wife.  Stated that he would like to return home.  It is unclear whether this will be possible.  The other option is nursing facility placement with Hospice care.  We discussed the [facility] home and [facility].  It is unclear whether he will be a candidate residential hospice as his prognosis may be greater than 2-3 weeks.  He is not a candidate for further chemotherapy.   Recommendations: 1.  Continue steroids and Bactrim as recommended by Dr. Megan Salon 2.  Wean oxygen as tolerated 3.  Wean lorazepam and gabapentin as tolerated 4.  Discussions with palliative care regarding disposition to home with hospice versus placement 5.  Out of bed, increase diet as tolerated   I will be out of town starting today.  Please call Oncology as needed.   LOS: 14 days   Betsy Coder, MD   09/11/2018, 10:34 AM

## 2018-09-11 NOTE — Progress Notes (Signed)
PROGRESS NOTE    Allen Glenn   CLE:[id number]  DOB: Jul 11, 1956  DOA: 08/28/2018 PCP: [name], MD   Brief Narrative:  Allen Glenn is a 62 year old male with hypertension, coronary artery disease, diabetes mellitus, metastatic colon cancer on chemotherapy who presented to the hospital for fever of 102 degrees, full oxygen level and fast heart rate.  Noted to be febrile and hypoxic in the ED and was found to have an elevated lactic acid.  He was admitted for sepsis suspected to be due to pneumonia.    Subjective: Patient is more alert today. No complaints. Eating mild-moderate amount of food per his wife.   Assessment & Plan:   Principal Problem:   Acute respiratory failure with hypoxia  - suspected to be bilateral pneumonia progressing to ARDS - the patient was maintained on a BiPAP and did not need intubation -CT chest performed 9/7 shows extensive opacities throughout both lungs. Small bilateral pleural effusion and bilateral lung nodule compatible with metastatic disease. - Solumedrol being used to treat possible pneumonitis and now being tapered- has about 10 days left  - - ID assisting with management of antibiotics-  Levofloxacin to be stopped today- Vancomycin stopped on 9/18 - Bactrim to be used a prophylaxis- currently on it IV- will change to oral in the next day or 2 based on how well he is swallowing - oxygen being weaned now and is at ~ 5 L - using BiPAP at night - have asked PCCM to look in on him again>  recommended to try off BiPAP tonight   Active Problems:   Colon cancer s/p right colectomy with metastasis to the lungs, bone, liver and brain - above episode of respiratory failure occurred after the patient received his first chemotherapy -For now chemotherapy is on hold as respiratory issues are being treated -He is managed by [name] whom I have spoken with today- he mentions to me that he is not a candidate for further chemotherapy  Dysphagia - on  D 1 diet due to being a high aspiration risk  Hypotension with h/o HTN (hypertension) - Lisinopril on hold   Insulin dependent type 2 diabetes mellitus, uncontrolled  - cont ISS AC/HS and Lantus Last A1c 7.9.    Hyponatremia - d/c Lasix on 9/18 as he appeared dehydrated    - has grade 1 d CHF-was not on Lasix at home - sodium is 127 today but he did receive a dose of Lasix yesterday     Normocytic anemia - likely AOCD- follow  Thrombocytopenia - noted to have a drop in platelets on lab work on 7/22 and has remained low since- Platelets have been stable at 60-90 range follow  Severe deconditioning - needs hoyer lift to get out of bed  Disposition: planning for home with hospice vs hospice home- appreciate palliative care team's assistance  DVT prophylaxis: SCDs Code Status: DNR Family Communication: wife Disposition Plan: cont to follow in SDU Consultants:   Oncology  ID  Palliative Care Procedures:   none Antimicrobials:  Anti-infectives (From admission, onward)   Start     Dose/Rate Route Frequency Ordered Stop   09/05/18 2200  vancomycin (VANCOCIN) IVPB 1000 mg/200 mL premix  Status:  Discontinued     1,000 mg 200 mL/hr over 60 Minutes Intravenous Every 12 hours 09/05/18 1353 09/10/18 1410   09/01/18 1800  sulfamethoxazole-trimethoprim (BACTRIM) 420 mg in dextrose 5 % 500 mL IVPB     420 mg 350.8 mL/hr over 90  Minutes Intravenous Every 8 hours 09/01/18 1529     09/01/18 1000  fluconazole (DIFLUCAN) IVPB 400 mg  Status:  Discontinued     400 mg 100 mL/hr over 120 Minutes Intravenous Every 24 hours 09/01/18 0838 09/09/18 1512   08/31/18 2200  vancomycin (VANCOCIN) 1,250 mg in sodium chloride 0.9 % 250 mL IVPB  Status:  Discontinued     1,250 mg 166.7 mL/hr over 90 Minutes Intravenous Every 12 hours 08/31/18 0852 09/05/18 1353   08/31/18 1000  meropenem (MERREM) 1 g in sodium chloride 0.9 % 100 mL IVPB  Status:  Discontinued     1 g 200 mL/hr over 30 Minutes  Intravenous Every 8 hours 08/31/18 0848 09/01/18 1546   08/31/18 1000  vancomycin (VANCOCIN) 1,750 mg in sodium chloride 0.9 % 500 mL IVPB     1,750 mg 250 mL/hr over 120 Minutes Intravenous  Once 08/31/18 0852 08/31/18 1146   08/29/18 1400  levofloxacin (LEVAQUIN) IVPB 750 mg     750 mg 100 mL/hr over 90 Minutes Intravenous Every 24 hours 08/29/18 1353 09/11/18 1400   08/28/18 2000  vancomycin (VANCOCIN) 1,250 mg in sodium chloride 0.9 % 250 mL IVPB  Status:  Discontinued     1,250 mg 166.7 mL/hr over 90 Minutes Intravenous Every 12 hours 08/28/18 1725 08/30/18 0853   08/28/18 1500  piperacillin-tazobactam (ZOSYN) IVPB 3.375 g  Status:  Discontinued     3.375 g 12.5 mL/hr over 240 Minutes Intravenous Every 8 hours 08/28/18 1245 08/31/18 0844   08/28/18 0900  vancomycin (VANCOCIN) 1,500 mg in sodium chloride 0.9 % 500 mL IVPB     1,500 mg 250 mL/hr over 120 Minutes Intravenous  Once 08/28/18 0854 08/28/18 1218   08/28/18 0900  piperacillin-tazobactam (ZOSYN) IVPB 3.375 g     3.375 g 100 mL/hr over 30 Minutes Intravenous  Once 08/28/18 0854 08/28/18 0935       Objective: Vitals:   09/11/18 0400 09/11/18 0500 09/11/18 0600 09/11/18 0722  BP: 122/72     Pulse: 97 93 91   Resp: 13 14 14    Temp:    (!) 96.8 F (36 C)  TempSrc:    Axillary  SpO2: 99% 99% 100%   Weight:      Height:        Intake/Output Summary (Last 24 hours) at 09/11/2018 0838 Last data filed at 09/11/2018 0500 Gross per 24 hour  Intake 3007.51 ml  Output 1850 ml  Net 1157.51 ml   Filed Weights   08/28/18 1400 09/09/18 1128  Weight: 84.2 kg 86.1 kg    Examination: General exam: Appears comfortable - sitting up in the bed, falling asleep easily- not participating in conversation HEENT: PERRLA, oral mucosa dry, no sclera icterus or thrush Respiratory system: Clear to auscultation. Respiratory effort normal. Cardiovascular system: S1 & S2 heard, RRR.   Gastrointestinal system: Abdomen soft, non-tender,  nondistended. Normal bowel sound. No organomegaly Central nervous system: Alert and oriented. No focal neurological deficits. Extremities: No cyanosis, clubbing or edema Skin: No rashes or ulcers Psychiatry:  Mood & affect appropriate.    Data Reviewed: I have personally reviewed following labs and imaging studies  CBC: Recent Labs  Lab 09/06/18 0443 09/07/18 0430 09/08/18 0348 09/09/18 0330 09/10/18 0529  WBC 9.2 11.4* 11.5* 11.8* 14.1*  NEUTROABS 8.6* 11.4* 11.1* 11.4* 13.5*  HGB 7.9* 8.4* 8.4* 9.0* 9.3*  HCT 23.6* 25.6* 25.5* 27.1* 28.3*  MCV 91.8 93.4 93.1 92.8 93.1  PLT 71* 69* 66* 72*  87*   Basic Metabolic Panel: Recent Labs  Lab 09/06/18 0443 09/07/18 0430 09/08/18 0348 09/09/18 0330 09/10/18 0529  NA 128* 131* 127* 128* 129*  K 4.1 4.4 4.8 4.2 5.0  CL 93* 96* 92* 93* 94*  CO2 22 23 23 23 23   GLUCOSE 231* 186* 145* 160* 154*  BUN 20 21 19 23  24*  CREATININE 0.41* 0.46* 0.43* 0.42* 0.42*  CALCIUM 7.8* 8.2* 7.9* 8.1* 8.4*  MG 2.4 2.5* 2.5* 2.6* 2.7*  PHOS 2.8 3.5 3.4 3.7 3.4   GFR: Estimated Creatinine Clearance: 112.7 mL/min (A) (by C-G formula based on SCr of 0.42 mg/dL (L)). Liver Function Tests: Recent Labs  Lab 09/06/18 0443 09/07/18 0430 09/08/18 0348 09/09/18 0330 09/10/18 0529  AST 28 30 26 28 28   ALT 26 27 24 25 25   ALKPHOS 183* 196* 176* 187* 177*  BILITOT 0.3 0.4 0.2* 0.2* 0.4  PROT 4.9* 5.0* 4.9* 4.9* 5.1*  ALBUMIN 1.6* 1.9* 1.8* 2.1* 2.2*   No results for input(s): LIPASE, AMYLASE in the last 168 hours. No results for input(s): AMMONIA in the last 168 hours. Coagulation Profile: No results for input(s): INR, PROTIME in the last 168 hours. Cardiac Enzymes: No results for input(s): CKTOTAL, CKMB, CKMBINDEX, TROPONINI in the last 168 hours. BNP (last 3 results) No results for input(s): PROBNP in the last 8760 hours. HbA1C: No results for input(s): HGBA1C in the last 72 hours. CBG: Recent Labs  Lab 09/10/18 1550 09/10/18 1941  09/11/18 0002 09/11/18 0329 09/11/18 0808  GLUCAP 180* 283* 289* 207* 196*   Lipid Profile: No results for input(s): CHOL, HDL, LDLCALC, TRIG, CHOLHDL, LDLDIRECT in the last 72 hours. Thyroid Function Tests: No results for input(s): TSH, T4TOTAL, FREET4, T3FREE, THYROIDAB in the last 72 hours. Anemia Panel: No results for input(s): VITAMINB12, FOLATE, FERRITIN, TIBC, IRON, RETICCTPCT in the last 72 hours. Urine analysis:    Component Value Date/Time   COLORURINE YELLOW 08/28/2018 1051   APPEARANCEUR CLEAR 08/28/2018 1051   LABSPEC 1.015 08/28/2018 1051   PHURINE 6.0 08/28/2018 1051   GLUCOSEU 150 (A) 08/28/2018 1051   HGBUR NEGATIVE 08/28/2018 1051   BILIRUBINUR NEGATIVE 08/28/2018 1051   KETONESUR NEGATIVE 08/28/2018 1051   PROTEINUR NEGATIVE 08/28/2018 1051   NITRITE NEGATIVE 08/28/2018 1051   LEUKOCYTESUR NEGATIVE 08/28/2018 1051   Sepsis Labs: @LABRCNTIP (procalcitonin:4,lacticidven:4) ) Recent Results (from the past 240 hour(s))  Respiratory Panel by PCR     Status: None   Collection Time: 09/01/18 11:46 AM  Result Value Ref Range Status   Adenovirus NOT DETECTED NOT DETECTED Final   Coronavirus 229E NOT DETECTED NOT DETECTED Final   Coronavirus HKU1 NOT DETECTED NOT DETECTED Final   Coronavirus NL63 NOT DETECTED NOT DETECTED Final   Coronavirus OC43 NOT DETECTED NOT DETECTED Final   Metapneumovirus NOT DETECTED NOT DETECTED Final   Rhinovirus / Enterovirus NOT DETECTED NOT DETECTED Final   Influenza A NOT DETECTED NOT DETECTED Final   Influenza B NOT DETECTED NOT DETECTED Final   Parainfluenza Virus 1 NOT DETECTED NOT DETECTED Final   Parainfluenza Virus 2 NOT DETECTED NOT DETECTED Final   Parainfluenza Virus 3 NOT DETECTED NOT DETECTED Final   Parainfluenza Virus 4 NOT DETECTED NOT DETECTED Final   Respiratory Syncytial Virus NOT DETECTED NOT DETECTED Final   Bordetella pertussis NOT DETECTED NOT DETECTED Final   Chlamydophila pneumoniae NOT DETECTED NOT  DETECTED Final   Mycoplasma pneumoniae NOT DETECTED NOT DETECTED Final    Comment: Performed at [facility],  [address], [place], [place] [address]         Radiology Studies: Dg Chest Port 1 View  Result Date: 09/10/2018 CLINICAL DATA:  Shortness of breath EXAM: PORTABLE CHEST 1 VIEW COMPARISON:  September 09, 2018 and September 08, 2018 FINDINGS: Port-A-Cath tip is at the cavoatrial junction. No pneumothorax. There is patchy atelectatic change in the lower lobes, stable on the left with slight clearing from the right base. Slight increased opacity in the right apex is probably due to patient rotation as opposed to an actual lesion in this area. Lungs elsewhere clear. Heart size and pulmonary vascular normal. No adenopathy. There is aortic atherosclerosis. No evident bone lesions. IMPRESSION: Bibasilar atelectasis, more on the left than on the right. There has been slight clearing from the right base with no change on the left. Slight increased opacity in the right apex region is probably due to rotation, although particular attention this area on subsequent evaluations is warranted. Stable cardiac silhouette. There is aortic atherosclerosis. No change in Port-A-Cath position. Aortic Atherosclerosis (ICD10-I70.0). Electronically Signed   By: Lowella Grip III M.D.   On: 09/10/2018 07:51   Dg Swallowing Func-speech Pathology  Result Date: 09/09/2018 Objective Swallowing Evaluation: Type of Study: MBS-Modified Barium Swallow Study  Patient Details Name: AKEEM HEPPLER MRN: [id number] Date of Birth: [birth year] Today's Date: 09/09/2018 Time: SLP Start Time (ACUTE ONLY): 0840 -SLP Stop Time (ACUTE ONLY): 0909 SLP Time Calculation (min) (ACUTE ONLY): 29 min Past Medical History: Past Medical History: Diagnosis Date . Acute MI ([place]) 2014  acute ST elevation MI . Anemia  . Colon cancer ([place]) 2016  Status post resection of colon mass is well as he panic metastasis.  . Coronary artery disease  .  Diabetes mellitus without complication ([place])  . Enlarged prostate   slightly and takes Flomax daily . Heart disease  . History of blood transfusion  . Hyperlipidemia   takes Lipitor daily . Hypertension   takes Lisinopril daily . Lung nodule   left . Nocturia  . Numbness   left foot . Pancreatitis  . Peripheral neuropathy  Past Surgical History: Past Surgical History: Procedure Laterality Date . 1/8 of liver removed   . APPENDECTOMY   . APPLICATION OF CRANIAL NAVIGATION Left 7/90/2409  Procedure: APPLICATION OF CRANIAL NAVIGATION;  Surgeon: Erline Levine, MD;  Location: [facility];  Service: Neurosurgery;  Laterality: Left; . BIOPSY  05/22/2018  Procedure: BIOPSY;  Surgeon: Carol Ada, MD;  Location: [facility];  Service: Endoscopy;; . BRAIN SURGERY  06/04/2018 . COLONOSCOPY   . CORONARY ANGIOPLASTY    1 stent . CORONARY STENT PLACEMENT  06-27-2013 . CRANIOTOMY Left 06/04/2018  Procedure: CRANIOTOMY TUMOR EXCISION with Brainlab;  Surgeon: Erline Levine, MD;  Location: [facility];  Service: Neurosurgery;  Laterality: Left;  CRANIOTOMY TUMOR EXCISION with Brainlab . ESOPHAGOGASTRODUODENOSCOPY Left 05/22/2018  Procedure: ESOPHAGOGASTRODUODENOSCOPY (EGD);  Surgeon: Carol Ada, MD;  Location: [facility];  Service: Endoscopy;  Laterality: Left; . HERNIA REPAIR Left 1991 . IR IMAGING GUIDED PORT INSERTION  07/18/2018 . LAPAROSCOPIC RIGHT COLECTOMY  2016  [place] . LIVER LOBECTOMY Right 09/28/2015   Right partial hepatectomy at [facility] . LUNG BIOPSY Left 11/28/2016  Procedure: LUNG BIOPSY, left upper lobe;  Surgeon: [name], MD;  Location: [facility];  Service: Thoracic;  Laterality: Left; Marland [place] VIDEO BRONCHOSCOPY WITH ENDOBRONCHIAL NAVIGATION N/A 11/28/2016  Procedure: VIDEO BRONCHOSCOPY WITH ENDOBRONCHIAL NAVIGATION;  Surgeon: [name], MD;  Location: [facility];  Service: Thoracic;  Laterality: N/A; .  VIDEO BRONCHOSCOPY WITH ENDOBRONCHIAL ULTRASOUND N/A 11/28/2016  Procedure: VIDEO BRONCHOSCOPY WITH ENDOBRONCHIAL ULTRASOUND;   Surgeon: [name], MD;  Location: [facility];  Service: Thoracic;  Laterality: N/A; HPI: 62 yo male adm to [facility] with hypoxia, increased lactic acid, febrile. 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  .  Pt has required Bipap on and off but currently is on 8 L HFNC.  He desires to eat.  concern for possible ARDS noted - pt with severe pna - right more than left patchy infiltrate.  WBC increasing from 5.7 to 11.5.  He has been made a DNR during this hospital coarse.   Pt's son and daughter present during eval and state pt wants to eat.   Subjective: pt awake in bed, sleepy but participative Assessment / Plan / Recommendation CHL IP CLINICAL IMPRESSIONS 09/09/2018 Clinical Impression Pt presents with moderately severe oral and mild pharyngeal dysphagia.  Decreased oral bolus control/organization and transiting noted due to weakness.  Pt frequently spills boluses into pharynx poorly controlled and oral residuals present.  He intermittently conducts dry swallow to clear oral cavity- at times requiring verbal cue.  Pharyngeal swallow is strong when triggered.  Secretions noted in pharynx which pt does not sense= nor did he clear with intake.  SLP challenged pt during MBS by providing him with 3 boluses of applesauce to swallow to assess for fatigue.  At end of MBS, pt did present with minimal residuals in pharynx without awareness, cued dry swallow effective to clear.  Educated pt and wife to findings/recommendations and concerns for pt to not receive adequate nutrition due to current level of dysphagia.  Although he did not aspirate on this MBS, he will continue to be high risk due to oral deficits and gross weakness.  Dys1 (extra creamy) and thin would be least restrictive diet for him with strict precautions.  Educated pt/wife to findings/recommendations and provided swallow signs to RN.  Will follow up briefly for tolerance.    SLP Visit Diagnosis Dysphagia, oropharyngeal phase (R13.12);Dysphagia, unspecified (R13.10) Attention and concentration deficit following -- Frontal lobe and executive function deficit following -- Impact on safety and function Moderate aspiration risk   CHL IP TREATMENT RECOMMENDATION 09/09/2018 Treatment Recommendations Therapy as outlined in treatment plan below   Prognosis 09/09/2018 Prognosis for Safe Diet Advancement Fair Barriers to Reach Goals Severity of deficits;Other (Comment) Barriers/Prognosis Comment -- CHL IP DIET RECOMMENDATION 09/09/2018 SLP Diet Recommendations Dysphagia 1 (Puree) solids;Thin liquid Liquid Administration via Cup;Straw Medication Administration Crushed with puree Compensations Minimize environmental distractions;Slow rate;Small sips/bites;Other (Comment) Postural Changes Remain semi-upright after after feeds/meals (Comment);Seated upright at 90 degrees   CHL IP OTHER RECOMMENDATIONS 09/09/2018 Recommended Consults -- Oral Care Recommendations (No Data) Other Recommendations Have oral suction available   CHL IP FOLLOW UP RECOMMENDATIONS 09/09/2018 Follow up Recommendations (No Data)   CHL IP FREQUENCY AND DURATION 09/09/2018 Speech Therapy Frequency (ACUTE ONLY) min 1 x/week Treatment Duration 1 week      CHL IP ORAL PHASE 09/09/2018 Oral Phase Impaired Oral - Pudding Teaspoon -- Oral - Pudding Cup -- Oral - Honey Teaspoon -- Oral - Honey Cup -- Oral - Nectar Teaspoon -- Oral - Nectar Cup -- Oral - Nectar Straw Delayed oral transit;Decreased bolus cohesion;Lingual/palatal residue;Premature spillage;Weak lingual manipulation;Lingual pumping;Reduced posterior propulsion Oral - Thin Teaspoon Delayed oral transit;Decreased bolus cohesion;Lingual/palatal residue;Premature spillage;Weak lingual manipulation;Reduced posterior propulsion Oral - Thin Cup Delayed oral transit;Decreased  bolus cohesion;Lingual/palatal residue;Premature spillage;Weak lingual manipulation;Lingual pumping;Reduced  posterior propulsion Oral - Thin Straw Delayed oral transit;Decreased bolus cohesion;Lingual/palatal residue;Premature spillage;Weak lingual manipulation;Lingual pumping Oral - Puree Delayed oral transit;Premature spillage;Weak lingual manipulation;Lingual/palatal residue;NT;Reduced posterior propulsion Oral - Mech Soft Delayed oral transit;Premature spillage;Weak lingual manipulation;Lingual/palatal residue;Lingual pumping;Reduced posterior propulsion;Impaired mastication Oral - Regular -- Oral - Multi-Consistency -- Oral - Pill Delayed oral transit;Premature spillage;Weak lingual manipulation;Lingual/palatal residue;Lingual pumping;Reduced posterior propulsion Oral Phase - Comment delayed 16 seconds to masticate a cracker bolus and transit, minimal mastication;  delay with puree 6-7 seconds oral transiting, premature spillage with poor control noted, oral residuals present without inconsistent awareness  CHL IP PHARYNGEAL PHASE 09/09/2018 Pharyngeal Phase Impaired Pharyngeal- Pudding Teaspoon -- Pharyngeal -- Pharyngeal- Pudding Cup -- Pharyngeal -- Pharyngeal- Honey Teaspoon -- Pharyngeal -- Pharyngeal- Honey Cup -- Pharyngeal -- Pharyngeal- Nectar Teaspoon -- Pharyngeal -- Pharyngeal- Nectar Cup -- Pharyngeal -- Pharyngeal- Nectar Straw WFL Pharyngeal -- Pharyngeal- Thin Teaspoon -- Pharyngeal -- Pharyngeal- Thin Cup WFL Pharyngeal -- Pharyngeal- Thin Straw WFL;Delayed swallow initiation-pyriform sinuses;Pharyngeal residue - valleculae;Pharyngeal residue - pyriform Pharyngeal -- Pharyngeal- Puree WFL Pharyngeal -- Pharyngeal- Mechanical Soft WFL Pharyngeal -- Pharyngeal- Regular -- Pharyngeal -- Pharyngeal- Multi-consistency -- Pharyngeal -- Pharyngeal- Pill WFL Pharyngeal -- Pharyngeal Comment --  CHL IP CERVICAL ESOPHAGEAL PHASE 09/09/2018 Cervical Esophageal Phase Impaired Pudding Teaspoon -- Pudding Cup -- Honey Teaspoon -- Honey Cup -- Nectar Teaspoon -- Nectar Cup -- Nectar Straw -- Thin Teaspoon -- Thin Cup  -- Thin Straw -- Puree -- Mechanical Soft -- Regular -- Multi-consistency -- Pill -- Cervical Esophageal Comment upon esophageal sweep after barium tablet administered with pudding, pt appeared with stasis in esophagus WITHOUT awareness, liquid bolus helpful to clear Macario Golds 09/09/2018, 10:04 AM    Luanna Salk, MS [facility] Pager [phone] Office [phone]              Scheduled Meds: . bisacodyl  10 mg Rectal Once  . chlorhexidine  15 mL Mouth Rinse BID  . Chlorhexidine Gluconate Cloth  6 each Topical Daily  . gabapentin  300 mg Oral TID  . insulin aspart  0-15 Units Subcutaneous TID WC  . insulin glargine  33 Units Subcutaneous QHS  . LORazepam  1 mg Intravenous QHS  . mouth rinse  15 mL Mouth Rinse q12n4p  . methylPREDNISolone (SOLU-MEDROL) injection  60 mg Intravenous Q12H  . morphine  15 mg Oral Daily  . multivitamin  15 mL Oral Daily  . nystatin  5 mL Oral QID  . pantoprazole (PROTONIX) IV  40 mg Intravenous Daily  . polyvinyl alcohol  1 drop Both Eyes TID  . protein supplement shake  11 oz Oral TID BM  . sodium chloride flush  3 mL Intravenous Q12H   Continuous Infusions: . sodium chloride Stopped (08/30/18 0808)  . sodium chloride Stopped (09/09/18 1732)  . levofloxacin (LEVAQUIN) IV Stopped (09/10/18 1632)  . sulfamethoxazole-trimethoprim Stopped (09/11/18 0620)     LOS: 14 days    Time spent in minutes: 30    Debbe Odea, MD [facility] Pager: [email or web address] Password [facility] 09/11/2018, 8:38 AM

## 2018-09-11 NOTE — Progress Notes (Signed)
Patient ID: Allen Glenn, male   DOB: Dec 27, 1955, 62 y.o.   MRN: [id number]  This NP visited patient at the bedside as a follow up to  yesterday's [place].  Wife and two children at bedside.   Patient has continued to wean down on O2 needs and currently is on 5L [place] and sating at 97% and appears comfortable but very weak, he continues to utilize BiPap at night.   Patient and family remain hopeful for more time and are open to all offered and available medical interventions to prolong life.  Created space and opportunity for family to express thoughts and feelings. [name]/wife asks me today about disposition options.  He tells me that [name] mentioned the idea of [facility].  We discussed the philosophy of residential hospice facilities; that being a focus of comfort and dignity not life prolonging interventions.  She is surprised and disappointed by the limited options.  I again spoke to his limited prognosis regardless of on going life prolonging measures   We spoke to human mortality      Emotional support offered.  Natural trajectory and expectations at end of life discussed.  Morphine in place for symptoms.    As always [name]/wife speaks to her great love of her husband and family.   Discussed with wife  the importance of continued conversation with her husband, family regarding overall plan of care and treatment options,  ensuring decisions are within the patient's best interest and GOCs.  Questions and concerns addressed   Discussed with Dr Wynelle Cleveland  Total time spent on the unit was 45  Minutes   PMT will continue to support holistically  Greater than 50% of the time was spent in counseling and coordination of care  [name] NP  Palliative Medicine Team Team Phone # [phone] Pager [phone]

## 2018-09-11 NOTE — Progress Notes (Signed)
Inpatient Diabetes Program Recommendations  AACE/ADA: New Consensus Statement on Inpatient Glycemic Control (2015)  Target Ranges:  Prepandial:   less than 140 mg/dL      Peak postprandial:   less than 180 mg/dL (1-2 hours)      Critically ill patients:  140 - 180 mg/dL   Results for DANEIL, BEEM (MRN [id number]) as of 09/11/2018 11:22  Ref. Range 09/09/2018 23:35 09/10/2018 03:23 09/10/2018 07:25 09/10/2018 11:43 09/10/2018 15:50 09/10/2018 19:41  Glucose-Capillary Latest Ref Range: 70 - 99 mg/dL 297 (H)  8 units NOVOLOG 155 (H)  3 units NOVOLOG 176 (H)  3 units NOVOLOG 201 (H)  5 units NOVOLOG 180 (H)  3 units NOVOLOG  283 (H)    33 units LANTUS     Results for SUMMER, PARTHASARATHY (MRN [id number]) as of 09/11/2018 11:22  Ref. Range 09/11/2018 00:02 09/11/2018 03:29 09/11/2018 08:08  Glucose-Capillary Latest Ref Range: 70 - 99 mg/dL 289 (H) 207 (H) 196 (H)  3 units NOVOLOG    Home DM Meds: Lantus 22 units QHS       Humalog 2-15 units TID per SSI  Current Orders: Lantus 33 units QHS      Novolog Moderate Correction Scale/ SSI (0-15 units) TID AC       Getting Solumedrol 60 mg BID.  Having glucose elevations.  Appetite poor at present.     MD- Please consider the following in-hospital insulin adjustments while patient remains on IV Steroids:  1. Increase Lantus slightly to 35 units QHS  2. May consider changing Novolog SSI coverage back to Q4 hours if pt continues to have poor PO intake     --Will follow patient during hospitalization--  Wyn Quaker RN, MSN, CDE Diabetes Coordinator Inpatient Glycemic Control Team Team Pager: [phone] (8a-5p)

## 2018-09-11 NOTE — Progress Notes (Signed)
Patient ID: Allen Glenn, male   DOB: April 10, 1956, 62 y.o.   MRN: [id number]         [facility]  Date of Admission:  08/28/2018           Day 14 levofloxacin        Day 10 trimethoprim sulfamethoxazole ASSESSMENT: He continues to make slow, steady progress.  PLAN: 1. Discontinue levofloxacin 2. 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 bisacodyl  10 mg Rectal Once  . chlorhexidine  15 mL Mouth Rinse BID  . Chlorhexidine Gluconate Cloth  6 each Topical Daily  . gabapentin  300 mg Oral TID  . insulin aspart  0-15 Units Subcutaneous TID WC  . insulin glargine  33 Units Subcutaneous QHS  . LORazepam  1 mg Intravenous QHS  . mouth rinse  15 mL Mouth Rinse q12n4p  . methylPREDNISolone (SOLU-MEDROL) injection  60 mg Intravenous Q12H  . morphine  15 mg Oral Daily  . multivitamin  15 mL Oral Daily  . nystatin  5 mL Oral QID  . pantoprazole (PROTONIX) IV  40 mg Intravenous Daily  . polyvinyl alcohol  1 drop Both Eyes TID  . protein supplement shake  11 oz Oral TID BM  . sodium chloride flush  3 mL Intravenous Q12H   Continuous Infusions: . sodium chloride Stopped (08/30/18 0808)  . sodium chloride Stopped (09/09/18 1732)  . levofloxacin (LEVAQUIN) IV Stopped  (09/10/18 1632)  . sulfamethoxazole-trimethoprim Stopped (09/11/18 0620)   PRN Meds:.sodium chloride, sodium chloride, acetaminophen **OR** acetaminophen, diphenoxylate-atropine, levalbuterol, magic mouthwash, metoCLOPramide (REGLAN) injection, morphine injection, nitroGLYCERIN, ondansetron **OR** ondansetron (ZOFRAN) IV, polyethylene glycol, sodium chloride flush, sodium chloride flush, traZODone   SUBJECTIVE: He tells me he feels like he is getting better.  He sat up in a chair yesterday.  He had an ice cream sandwich.  Review of Systems: Review of Systems  Unable to perform ROS: Acuity of condition    No Known Allergies  OBJECTIVE: Vitals:   09/11/18 0400 09/11/18 0500 09/11/18 0600 09/11/18 0722  BP: 122/72     Pulse: 97 93 91   Resp: 13 14 14    Temp:    (!) 96.8 F (36 C)  TempSrc:    Axillary  SpO2: 99% 99% 100%   Weight:      Height:       Body mass index is 24.37 kg/m.  Physical Exam  Constitutional: He is oriented to person, place, and time.  He is more alert and talkative still very weak.  Cardiovascular: Regular rhythm and normal heart sounds.  No murmur heard. He is tachycardic.  Pulmonary/Chest: Effort normal and breath sounds normal.  Neurological: He is alert and oriented to person, place, and time.  Lab Results Lab Results  Component Value Date   WBC 14.1 (H) 09/10/2018   HGB 9.3 (L) 09/10/2018   HCT 28.3 (L) 09/10/2018   MCV 93.1 09/10/2018   PLT 87 (L) 09/10/2018    Lab Results  Component Value Date   CREATININE 0.42 (L) 09/10/2018   BUN 24 (H) 09/10/2018   NA 129 (L) 09/10/2018   K 5.0 09/10/2018   CL 94 (L) 09/10/2018   CO2 23 09/10/2018    Lab Results  Component Value Date   ALT 25 09/10/2018   AST 28 09/10/2018   ALKPHOS 177 (H) 09/10/2018   BILITOT 0.4 09/10/2018     Microbiology: Recent Results (from the past 240 hour(s))  Respiratory Panel by PCR     Status: None   Collection Time: 09/01/18 11:46 AM  Result Value Ref  Range Status   Adenovirus NOT DETECTED NOT DETECTED Final   Coronavirus 229E NOT DETECTED NOT DETECTED Final   Coronavirus HKU1 NOT DETECTED NOT DETECTED Final   Coronavirus NL63 NOT DETECTED NOT DETECTED Final   Coronavirus OC43 NOT DETECTED NOT DETECTED Final   Metapneumovirus NOT DETECTED NOT DETECTED Final   Rhinovirus / Enterovirus NOT DETECTED NOT DETECTED Final   Influenza A NOT DETECTED NOT DETECTED Final   Influenza B NOT DETECTED NOT DETECTED Final   Parainfluenza Virus 1 NOT DETECTED NOT DETECTED Final   Parainfluenza Virus 2 NOT DETECTED NOT DETECTED Final   Parainfluenza Virus 3 NOT DETECTED NOT DETECTED Final   Parainfluenza Virus 4 NOT DETECTED NOT DETECTED Final   Respiratory Syncytial Virus NOT DETECTED NOT DETECTED Final   Bordetella pertussis NOT DETECTED NOT DETECTED Final   Chlamydophila pneumoniae NOT DETECTED NOT DETECTED Final   Mycoplasma pneumoniae NOT DETECTED NOT DETECTED Final    Comment: Performed at [facility], [address] [place], [place] [address]    Michel Bickers, [facility] [place] [facility] [phone] pager   [phone] cell 09/11/2018, 9:48 AM

## 2018-09-12 ENCOUNTER — Telehealth: Payer: Self-pay | Admitting: Oncology

## 2018-09-12 LAB — GLUCOSE, CAPILLARY
GLUCOSE-CAPILLARY: 171 mg/dL — AB (ref 70–99)
GLUCOSE-CAPILLARY: 182 mg/dL — AB (ref 70–99)
Glucose-Capillary: 176 mg/dL — ABNORMAL HIGH (ref 70–99)
Glucose-Capillary: 266 mg/dL — ABNORMAL HIGH (ref 70–99)
Glucose-Capillary: 250 mg/dL — ABNORMAL HIGH (ref 70–99)

## 2018-09-12 LAB — BASIC METABOLIC PANEL
ANION GAP: 14 (ref 5–15)
BUN: 18 mg/dL (ref 8–23)
CO2: 22 mmol/L (ref 22–32)
Calcium: 8.7 mg/dL — ABNORMAL LOW (ref 8.9–10.3)
Chloride: 91 mmol/L — ABNORMAL LOW (ref 98–111)
Creatinine, Ser: 0.43 mg/dL — ABNORMAL LOW (ref 0.61–1.24)
Glucose, Bld: 156 mg/dL — ABNORMAL HIGH (ref 70–99)
POTASSIUM: 5.3 mmol/L — AB (ref 3.5–5.1)
Sodium: 127 mmol/L — ABNORMAL LOW (ref 135–145)

## 2018-09-12 MED ORDER — GABAPENTIN 100 MG PO CAPS
100.0000 mg | ORAL_CAPSULE | Freq: Three times a day (TID) | ORAL | Status: DC
  Administered 2018-09-12 – 2018-09-16 (×13): 100 mg via ORAL
  Filled 2018-09-12 (×14): qty 1

## 2018-09-12 MED ORDER — METHYLPREDNISOLONE SODIUM SUCC 40 MG IJ SOLR
40.0000 mg | Freq: Two times a day (BID) | INTRAMUSCULAR | Status: DC
  Administered 2018-09-12 – 2018-09-14 (×4): 40 mg via INTRAVENOUS
  Filled 2018-09-12 (×4): qty 1

## 2018-09-12 MED ORDER — MORPHINE SULFATE ER 15 MG PO TBCR: 15.0000 mg | EXTENDED_RELEASE_TABLET | Freq: Every day | ORAL | Status: DC | PRN

## 2018-09-12 MED ORDER — LORAZEPAM 2 MG/ML IJ SOLN
0.5000 mg | Freq: Two times a day (BID) | INTRAMUSCULAR | Status: DC | PRN
  Administered 2018-09-16 – 2018-09-20 (×3): 0.5 mg via INTRAVENOUS
  Filled 2018-09-12 (×3): qty 1

## 2018-09-12 MED ORDER — PATIROMER SORBITEX CALCIUM 8.4 G PO PACK
8.4000 g | PACK | Freq: Every day | ORAL | Status: DC
  Administered 2018-09-12 – 2018-09-15 (×4): 8.4 g via ORAL
  Filled 2018-09-12 (×5): qty 1

## 2018-09-12 MED ORDER — SULFAMETHOXAZOLE-TRIMETHOPRIM 800-160 MG PO TABS
2.0000 | ORAL_TABLET | Freq: Three times a day (TID) | ORAL | Status: AC
  Administered 2018-09-12 – 2018-09-15 (×9): 2 via ORAL
  Filled 2018-09-12 (×10): qty 2

## 2018-09-12 NOTE — Progress Notes (Signed)
PROGRESS NOTE    BOSTON COOKSON   IDP:[id number]  DOB: Apr 09, 1956  DOA: 08/28/2018 PCP: [name], MD   Brief Narrative:  Allen Glenn is a 62 year old male with hypertension, coronary artery disease, diabetes mellitus, metastatic colon cancer on chemotherapy who presented to the hospital for fever of 102 degrees, full oxygen level and fast heart rate.  Noted to be febrile and hypoxic in the ED and was found to have an elevated lactic acid.  He was admitted for sepsis suspected to be due to pneumonia.    Subjective: Oral intake is poor as he tires easily when trying to eat. Overall stable otherwise. No complaints.   Assessment & Plan:   Principal Problem:   Acute respiratory failure with hypoxia  - suspected to be bilateral pneumonia progressing to ARDS - the patient was maintained on a BiPAP and did not need intubation -CT chest performed 9/7 shows extensive opacities throughout both lungs. Small bilateral pleural effusion and bilateral lung nodule compatible with metastatic disease. - Solumedrol being used to treat possible pneumonitis and now being tapered- has about 10 days left  - - ID assisting with management of antibiotics-  Levofloxacin to be stopped today- Vancomycin stopped on 9/18 - Bactrim to be used a prophylaxis- currently on it IV- will change to oral in the next day or 2 based on how well he is swallowing - have asked PCCM to look in on him again>  recommended to try off BiPAP tonight which he did well with last night- has been weaned down to 3 L today   Active Problems:   Colon cancer s/p right colectomy with metastasis to the lungs, bone, liver and brain - above episode of respiratory failure occurred after the patient received his first chemotherapy -For now chemotherapy is on hold as respiratory issues are being treated -He is managed by [name] who tells me that he is not a candidate for further chemotherapy  Dysphagia - on D 1 diet due to being a  high aspiration risk- will try to advance to mechanical soft today as he is craving more solid food- family understand aspiration risk  Hypotension with h/o HTN (hypertension) - Lisinopril on hold   Insulin dependent type 2 diabetes mellitus, uncontrolled  - cont ISS AC/HS and Lantus - Last A1c 7.9.  - sugars high due to steroids- increase Lantus today   Hyponatremia - d/c Lasix on 9/18 as he appeared dehydrated    - has grade 1 d CHF and was not on Lasix at home - sodium is 127  - ? If this is related to Bactrim- he is receiving about 1.5 L of infusions with Bactrim daily - have switched to oral Bactrim today - he had > 3 L in urine output yesterday - following closely  Hyperkalemia - ongoing hyperkalemia - receiving Valtessa daily until improvement is seen- possibly due to Bactrim     Normocytic anemia - likely AOCD- follow  Thrombocytopenia - noted to have a drop in platelets on lab work on 7/22 and has remained low since- Platelets have been stable at 60-90 range follow  Severe deconditioning - needs hoyer lift to get out of bed  Severe fatigue/ sedated - change bedtime Ativan to PRN- wean down Neurontin  Disposition: planning for home with hospice vs hospice home- appreciate palliative care team's assistance  DVT prophylaxis: SCDs Code Status: DNR Family Communication: wife Disposition Plan: cont to follow in SDU for today Consultants:   Oncology  ID  Palliative Care Procedures:   none Antimicrobials:  Anti-infectives (From admission, onward)   Start     Dose/Rate Route Frequency Ordered Stop   09/12/18 2200  sulfamethoxazole-trimethoprim (BACTRIM DS,SEPTRA DS) 800-160 MG per tablet 2 tablet     2 tablet Oral Every 8 hours 09/12/18 1224 09/15/18 2159   09/05/18 2200  vancomycin (VANCOCIN) IVPB 1000 mg/200 mL premix  Status:  Discontinued     1,000 mg 200 mL/hr over 60 Minutes Intravenous Every 12 hours 09/05/18 1353 09/10/18 1410   09/01/18 1800   sulfamethoxazole-trimethoprim (BACTRIM) 420 mg in dextrose 5 % 500 mL IVPB     420 mg 350.8 mL/hr over 90 Minutes Intravenous Every 8 hours 09/01/18 1529 09/12/18 2029   09/01/18 1000  fluconazole (DIFLUCAN) IVPB 400 mg  Status:  Discontinued     400 mg 100 mL/hr over 120 Minutes Intravenous Every 24 hours 09/01/18 0838 09/09/18 1512   08/31/18 2200  vancomycin (VANCOCIN) 1,250 mg in sodium chloride 0.9 % 250 mL IVPB  Status:  Discontinued     1,250 mg 166.7 mL/hr over 90 Minutes Intravenous Every 12 hours 08/31/18 0852 09/05/18 1353   08/31/18 1000  meropenem (MERREM) 1 g in sodium chloride 0.9 % 100 mL IVPB  Status:  Discontinued     1 g 200 mL/hr over 30 Minutes Intravenous Every 8 hours 08/31/18 0848 09/01/18 1546   08/31/18 1000  vancomycin (VANCOCIN) 1,750 mg in sodium chloride 0.9 % 500 mL IVPB     1,750 mg 250 mL/hr over 120 Minutes Intravenous  Once 08/31/18 0852 08/31/18 1146   08/29/18 1400  levofloxacin (LEVAQUIN) IVPB 750 mg     750 mg 100 mL/hr over 90 Minutes Intravenous Every 24 hours 08/29/18 1353 09/11/18 1400   08/28/18 2000  vancomycin (VANCOCIN) 1,250 mg in sodium chloride 0.9 % 250 mL IVPB  Status:  Discontinued     1,250 mg 166.7 mL/hr over 90 Minutes Intravenous Every 12 hours 08/28/18 1725 08/30/18 0853   08/28/18 1500  piperacillin-tazobactam (ZOSYN) IVPB 3.375 g  Status:  Discontinued     3.375 g 12.5 mL/hr over 240 Minutes Intravenous Every 8 hours 08/28/18 1245 08/31/18 0844   08/28/18 0900  vancomycin (VANCOCIN) 1,500 mg in sodium chloride 0.9 % 500 mL IVPB     1,500 mg 250 mL/hr over 120 Minutes Intravenous  Once 08/28/18 0854 08/28/18 1218   08/28/18 0900  piperacillin-tazobactam (ZOSYN) IVPB 3.375 g     3.375 g 100 mL/hr over 30 Minutes Intravenous  Once 08/28/18 0854 08/28/18 0935       Objective: Vitals:   09/12/18 0400 09/12/18 0800 09/12/18 0855 09/12/18 1200  BP: 117/61 116/65  114/66  Pulse: (!) 109 (!) 101  98  Resp: 17 18  19   Temp:   99.1 F (37.3 C)  97.9 F (36.6 C)  TempSrc:  Axillary  Axillary  SpO2: 96% 97% 95% 96%  Weight:      Height:        Intake/Output Summary (Last 24 hours) at 09/12/2018 1337 Last data filed at 09/12/2018 1242 Gross per 24 hour  Intake 1500.6 ml  Output 3850 ml  Net -2349.4 ml   Filed Weights   08/28/18 1400 09/09/18 1128 09/11/18 1900  Weight: 84.2 kg 86.1 kg 85.2 kg    Examination: General exam: Appears comfortable - sitting up in the bed, falling asleep easily again today- not participating in conversation HEENT: PERRLA, oral mucosa dry, no sclera icterus or thrush  Respiratory system: Clear to auscultation. Respiratory effort normal. Cardiovascular system: S1 & S2 heard, RRR.   Gastrointestinal system: Abdomen soft, non-tender, nondistended. Normal bowel sound. No organomegaly Central nervous system: Alert and oriented but falls asleep very easily No focal neurological deficits. Extremities: No cyanosis, clubbing or edema Skin: No rashes or ulcers Psychiatry:  Flat affect   Data Reviewed: I have personally reviewed following labs and imaging studies  CBC: Recent Labs  Lab 09/06/18 0443 09/07/18 0430 09/08/18 0348 09/09/18 0330 09/10/18 0529 09/11/18 1025  WBC 9.2 11.4* 11.5* 11.8* 14.1* 12.4*  NEUTROABS 8.6* 11.4* 11.1* 11.4* 13.5*  --   HGB 7.9* 8.4* 8.4* 9.0* 9.3* 9.5*  HCT 23.6* 25.6* 25.5* 27.1* 28.3* 28.9*  MCV 91.8 93.4 93.1 92.8 93.1 93.8  PLT 71* 69* 66* 72* 87* 87*   Basic Metabolic Panel: Recent Labs  Lab 09/06/18 0443 09/07/18 0430 09/08/18 0348 09/09/18 0330 09/10/18 0529 09/11/18 1025 09/11/18 1500 09/12/18 0530  NA 128* 131* 127* 128* 129* 127* 127* 127*  K 4.1 4.4 4.8 4.2 5.0 5.3* 5.0 5.3*  CL 93* 96* 92* 93* 94* 91* 90* 91*  CO2 22 23 23 23 23  20* 21* 22  GLUCOSE 231* 186* 145* 160* 154* 302* 243* 156*  BUN 20 21 19 23  24* 17 17 18   CREATININE 0.41* 0.46* 0.43* 0.42* 0.42* 0.53* 0.52* 0.43*  CALCIUM 7.8* 8.2* 7.9* 8.1* 8.4* 8.4*  8.4* 8.7*  MG 2.4 2.5* 2.5* 2.6* 2.7*  --   --   --   PHOS 2.8 3.5 3.4 3.7 3.4  --   --   --    GFR: Estimated Creatinine Clearance: 112.7 mL/min (A) (by C-G formula based on SCr of 0.43 mg/dL (L)). Liver Function Tests: Recent Labs  Lab 09/06/18 0443 09/07/18 0430 09/08/18 0348 09/09/18 0330 09/10/18 0529  AST 28 30 26 28 28   ALT 26 27 24 25 25   ALKPHOS 183* 196* 176* 187* 177*  BILITOT 0.3 0.4 0.2* 0.2* 0.4  PROT 4.9* 5.0* 4.9* 4.9* 5.1*  ALBUMIN 1.6* 1.9* 1.8* 2.1* 2.2*   No results for input(s): LIPASE, AMYLASE in the last 168 hours. No results for input(s): AMMONIA in the last 168 hours. Coagulation Profile: No results for input(s): INR, PROTIME in the last 168 hours. Cardiac Enzymes: No results for input(s): CKTOTAL, CKMB, CKMBINDEX, TROPONINI in the last 168 hours. BNP (last 3 results) No results for input(s): PROBNP in the last 8760 hours. HbA1C: No results for input(s): HGBA1C in the last 72 hours. CBG: Recent Labs  Lab 09/11/18 1951 09/11/18 2314 09/12/18 0327 09/12/18 0814 09/12/18 1140  GLUCAP 201* 278* 176* 266* 250*   Lipid Profile: No results for input(s): CHOL, HDL, LDLCALC, TRIG, CHOLHDL, LDLDIRECT in the last 72 hours. Thyroid Function Tests: No results for input(s): TSH, T4TOTAL, FREET4, T3FREE, THYROIDAB in the last 72 hours. Anemia Panel: No results for input(s): VITAMINB12, FOLATE, FERRITIN, TIBC, IRON, RETICCTPCT in the last 72 hours. Urine analysis:    Component Value Date/Time   COLORURINE YELLOW 08/28/2018 1051   APPEARANCEUR CLEAR 08/28/2018 1051   LABSPEC 1.015 08/28/2018 1051   PHURINE 6.0 08/28/2018 1051   GLUCOSEU 150 (A) 08/28/2018 1051   HGBUR NEGATIVE 08/28/2018 1051   BILIRUBINUR NEGATIVE 08/28/2018 1051   KETONESUR NEGATIVE 08/28/2018 1051   PROTEINUR NEGATIVE 08/28/2018 1051   NITRITE NEGATIVE 08/28/2018 1051   LEUKOCYTESUR NEGATIVE 08/28/2018 1051   Sepsis Labs: @LABRCNTIP (procalcitonin:4,lacticidven:4) ) No  results found for this or any previous visit (from the past  240 hour(s)).       Radiology Studies: No results found.    Scheduled Meds: . bisacodyl  10 mg Rectal Once  . chlorhexidine  15 mL Mouth Rinse BID  . Chlorhexidine Gluconate Cloth  6 each Topical Daily  . gabapentin  100 mg Oral TID  . insulin aspart  0-15 Units Subcutaneous TID WC  . insulin glargine  33 Units Subcutaneous QHS  . mouth rinse  15 mL Mouth Rinse q12n4p  . methylPREDNISolone (SOLU-MEDROL) injection  40 mg Intravenous Q12H  . morphine  15 mg Oral Daily  . multivitamin  15 mL Oral Daily  . nystatin  5 mL Oral QID  . pantoprazole (PROTONIX) IV  40 mg Intravenous Daily  . patiromer  8.4 g Oral Daily  . polyvinyl alcohol  1 drop Both Eyes TID  . protein supplement shake  11 oz Oral TID BM  . sodium chloride flush  3 mL Intravenous Q12H  . sulfamethoxazole-trimethoprim  2 tablet Oral Q8H   Continuous Infusions: . sodium chloride Stopped (08/30/18 0808)  . sodium chloride Stopped (09/09/18 1732)  . sulfamethoxazole-trimethoprim 420 mg (09/12/18 1242)     LOS: 15 days    Time spent in minutes: 30    Debbe Odea, MD [facility] Pager: [email or web address] Password [email or web address] 09/12/2018, 1:37 PM

## 2018-09-12 NOTE — Telephone Encounter (Signed)
Per 9/20 scheduled message cancelled 9/24 appointments and added 1 hour f/u 10/16. Spoke with wife. Confirmed w/LT all 9/24 appointments to be cancelled and ok to use 10/16 @ 9:30 am for f/u.

## 2018-09-12 NOTE — Care Management Note (Signed)
Case Management Note  Patient Details  Name: Allen Glenn MRN: [id number] Date of Birth: 1956/02/18  Subjective/Objective:                  Respiratory failure  Action/Plan: [id number] From note: PLAN: 1. Discontinue levofloxacin 2. Trimethoprim sulfamethoxazole and steroids for 11 more days Following for progression of care and condition. Following for cm needs. Expected Discharge Date:  (unknown)               Expected Discharge Plan:  Home/Self Care  In-House Referral:     Discharge planning Services  CM Consult  Post Acute Care Choice:    Choice offered to:     DME Arranged:    DME Agency:     HH Arranged:    HH Agency:     Status of Service:  In process, will continue to follow  If discussed at Long Length of Stay Meetings, dates discussed:    Additional Comments:  [name], RN 09/12/2018, 10:06 AM

## 2018-09-12 NOTE — Progress Notes (Addendum)
Inpatient Diabetes Program Recommendations  AACE/ADA: New Consensus Statement on Inpatient Glycemic Control (2015)  Target Ranges:  Prepandial:   less than 140 mg/dL      Peak postprandial:   less than 180 mg/dL (1-2 hours)      Critically ill patients:  140 - 180 mg/dL   Results for DMETRIUS, AMBS (MRN [id number]) as of 09/12/2018 13:07  Ref. Range 09/11/2018 08:08 09/11/2018 11:49 09/11/2018 17:05 09/11/2018 19:51 09/11/2018 23:14  Glucose-Capillary Latest Ref Range: 70 - 99 mg/dL 196 (H) 292 (H) 198 (H) 201 (H) 278 (H)   Results for ADAN, BAEHR (MRN [id number]) as of 09/12/2018 13:07  Ref. Range 09/12/2018 08:14 09/12/2018 11:40  Glucose-Capillary Latest Ref Range: 70 - 99 mg/dL 266 (H) 250 (H)    Home DM Meds: Lantus 22 units QHS                             Humalog 2-15 units TID per SSI  Current Orders: Lantus 33 units QHS                            Novolog Moderate Correction Scale/ SSI (0-15 units) TID AC       Solumedrol reduced to 40 mg BID.  Having glucose elevations.  Appetite poor at present.     MD- Please consider the following in-hospital insulin adjustments while patient remains on IV Steroids:  1. Increase Lantus slightly to 35 units QHS  2. May consider changing Novolog SSI coverage back to Q4 hours if pt continues to have poor PO intake     --Will follow patient during hospitalization--  Wyn Quaker RN, MSN, CDE Diabetes Coordinator Inpatient Glycemic Control Team Team Pager: [phone] (8a-5p)

## 2018-09-12 NOTE — Progress Notes (Signed)
NAME:  Allen Glenn, MRN:  [id number], DOB:  12-14-1956, LOS: 41 ADMISSION DATE:  08/28/2018, CONSULTATION DATE:  09/11/18 REFERRING MD:  Dr. 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  Followed by [name], s/p cycle 1, day 1 of FOLFIRI which was dosed on 08/19/2018.  Admitted 9/5 with sepsis syndrome (fever to 103, tachy, tachypnea, lactate 4 & pancytopenia).  Concern for possible pulmonary infiltrates.  CTA chest negative for PE but did show metastatic disease.  Treated with broad spectrum abx (Vanco / Zosyn / Levaquin).  By 9/7 was slowly improving and treated with Granix.  9/8 found to be more tachypneic.  PCCM consulted 9/9 for hypoxic resp failure, treated for possible pneumonitis with steroid taper.  ABX per ID.  PCCM called back 9/19 for review with BiPAP needs.  Sedating medications reduced with improvement in O2 needs.  No further BiPAP.       Significant Hospital Events   9/05 Admit 9/20 Improved mental / resp status off sedation  Consults: date of consult/date signed off & final recs:  9/09 CCM consulted 9/10 CCM signed off 9/19 CCM called back, sedation decreased  Procedures (surgical and bedside):    Significant Diagnostic Tests:  9/07  CT Chest >> neg for PE, patchy opacities throughout bilaterally, small stable bilateral lung nodules consistent with metastatic disease, oval low density mass in the anterior aspect of the medial segment of liver 4.9 x 3.9 cm consistent with metastasis, no change in prior metastatic thoracic disease, ribs.   9/07 ECHO >> LVEF 65-70%, no RWMA, grade 1 diastolic dysfunction  Micro Data: RVP 9/9 >> negative  BCx2 9/5 >> negative   Antimicrobials:  Per ID  Subjective:  Pt's son reports improvement in mental status.  RN reports pt doing well without scheduled narcotics > has refused daily MS contin.  Did not need BiPAP last night.   Objective   Blood pressure  116/65, pulse (!) 101, temperature 99.1 F (37.3 C), temperature source Axillary, resp. rate 18, height 6\' 2"  (1.88 m), weight 85.2 kg, SpO2 95 %.        Intake/Output Summary (Last 24 hours) at 09/12/2018 1246 Last data filed at 09/12/2018 1044 Gross per 24 hour  Intake 1216.41 ml  Output 3350 ml  Net -2133.59 ml   Filed Weights   08/28/18 1400 09/09/18 1128 09/11/18 1900  Weight: 84.2 kg 86.1 kg 85.2 kg    Examination: General: chronically ill appearing male in NAD  HENT: MM pink/moist, dressing to bridge of nose  Lungs: even/non-labored on [place] O2, clear anterior, diminished bases  Cardiovascular: s1s2 rrr, no m/r/g Abdomen: soft, non-tender, bsx4 active  Extremities: warm/dry, no edema  Neuro: AAOx4, speech clear, MAE   Resolved Hospital Problem list    Assessment & Plan:   Acute Respiratory Failure with Hypoxia - In setting of resolving PNA, metastatic disease, small pleural effusions, possible pneumonitis  P: Wean O2 as needed for sats > 90% Pulmonary hygiene - IS, mobilize  ABX per ID  Continue bactrim for prophylactic dosing  Change BiPAP order to PRN > may reach a point where this is no longer a tool but a risk PRN morphine for pain / dyspnea  DNR / DNI  Dysphagia  At Risk Aspiration  P: SLP following  Per Primary   Severe Deconditioning / Failure to Thrive P: Per primary  PT efforts as able  Disposition / Summary of Today's Plan 09/12/18   Continue current care.  Minimize sedation as able. PCCM will be available PRN.  Please call back if new needs arise.     Diet: per primary  Pain/Anxiety/Delirium protocol (if indicated): PRN morphine  VAP protocol (if indicated)n/a DVT prophylaxis: per primary  GI prophylaxis: n/a Hyperglycemia protocol: per primary  Mobility: As tolerated  Code Status: DNR Family Communication: Son updated at bedside on patients status  Labs   CBC: Recent Labs  Lab 09/06/18 0443 09/07/18 0430 09/08/18 0348  09/09/18 0330 09/10/18 0529 09/11/18 1025  WBC 9.2 11.4* 11.5* 11.8* 14.1* 12.4*  NEUTROABS 8.6* 11.4* 11.1* 11.4* 13.5*  --   HGB 7.9* 8.4* 8.4* 9.0* 9.3* 9.5*  HCT 23.6* 25.6* 25.5* 27.1* 28.3* 28.9*  MCV 91.8 93.4 93.1 92.8 93.1 93.8  PLT 71* 69* 66* 72* 87* 87*   Basic Metabolic Panel: Recent Labs  Lab 09/06/18 0443 09/07/18 0430 09/08/18 0348 09/09/18 0330 09/10/18 0529 09/11/18 1025 09/11/18 1500 09/12/18 0530  NA 128* 131* 127* 128* 129* 127* 127* 127*  K 4.1 4.4 4.8 4.2 5.0 5.3* 5.0 5.3*  CL 93* 96* 92* 93* 94* 91* 90* 91*  CO2 22 23 23 23 23  20* 21* 22  GLUCOSE 231* 186* 145* 160* 154* 302* 243* 156*  BUN 20 21 19 23  24* 17 17 18   CREATININE 0.41* 0.46* 0.43* 0.42* 0.42* 0.53* 0.52* 0.43*  CALCIUM 7.8* 8.2* 7.9* 8.1* 8.4* 8.4* 8.4* 8.7*  MG 2.4 2.5* 2.5* 2.6* 2.7*  --   --   --   PHOS 2.8 3.5 3.4 3.7 3.4  --   --   --    GFR: Estimated Creatinine Clearance: 112.7 mL/min (A) (by C-G formula based on SCr of 0.43 mg/dL (L)). Recent Labs  Lab 09/08/18 0348 09/09/18 0330 09/10/18 0529 09/11/18 1025  WBC 11.5* 11.8* 14.1* 12.4*   Liver Function Tests: Recent Labs  Lab 09/06/18 0443 09/07/18 0430 09/08/18 0348 09/09/18 0330 09/10/18 0529  AST 28 30 26 28 28   ALT 26 27 24 25 25   ALKPHOS 183* 196* 176* 187* 177*  BILITOT 0.3 0.4 0.2* 0.2* 0.4  PROT 4.9* 5.0* 4.9* 4.9* 5.1*  ALBUMIN 1.6* 1.9* 1.8* 2.1* 2.2*   No results for input(s): LIPASE, AMYLASE in the last 168 hours. No results for input(s): AMMONIA in the last 168 hours. ABG    Component Value Date/Time   PHART 7.463 (H) 09/02/2018 0125   PCO2ART 33.8 09/02/2018 0125   PO2ART 56.4 (L) 09/02/2018 0125   HCO3 23.8 09/02/2018 0125   O2SAT 87.6 09/02/2018 0125    Coagulation Profile: No results for input(s): INR, PROTIME in the last 168 hours. Cardiac Enzymes: No results for input(s): CKTOTAL, CKMB, CKMBINDEX, TROPONINI in the last 168 hours. HbA1C: Hgb A1c MFr Bld  Date/Time Value Ref Range  Status  05/30/2018 08:58 AM 7.9 (H) 4.8 - 5.6 % Final    Comment:    (NOTE) Pre diabetes:          5.7%-6.4% Diabetes:              >6.4% Glycemic control for   <7.0% adults with diabetes   03/07/2015 11:03 AM 6.4 (H) 4.8 - 5.6 % Final    Comment:             Pre-diabetes: 5.7 - 6.4          Diabetes: >6.4          Glycemic control for  adults with diabetes: <7.0    CBG: Recent Labs  Lab 09/11/18 1951 09/11/18 2314 09/12/18 0327 09/12/18 0814 09/12/18 1140  GLUCAP 201* 278* 176* 266* 250*      Noe Gens, NP-C [facility] Pgr: [phone] or if no answer [phone] 09/12/2018, 12:46 PM

## 2018-09-12 NOTE — Progress Notes (Signed)
Patient ID: Allen Glenn, male   DOB: 02-17-1956, 62 y.o.   MRN: [id number]         [facility] for Infectious Disease    Date of Admission:  08/28/2018    Day 16 antibiotics                                                                                     Day 11 trimethoprim sulfamethoxazole  Allen Glenn is sitting up in bed visiting with family and friends.  He has nasal prong oxygen.  He has developed some mild hyperkalemia, most likely related to his trimethoprim sulfamethoxazole.  He has been started on a potassium binder.  As a result I will shorten his course of empiric trimethoprim sulfamethoxazole to 14 days.  Please call my partner, Dr. Leonor Liv Comer [phone]), for any infectious disease questions this weekend.         Michel Bickers, MD [facility] for Infectious [facility] [phone] pager   [phone] cell 09/12/2018, 1:47 PM

## 2018-09-12 NOTE — Progress Notes (Signed)
Pharmacy Antibiotic Note  Allen Glenn is a 62 y.o. male with metastatic colon cancer undergoing chemotherapy treatment, presented to the ED on with sepsis. ID currently seeing patient.  Fluconazole, Vancomycin, and Levaquin courses have been completed.  He's currently on Bactrim for PCP coverage.    Today, 09/12/2018: - day #11 Sulfamethoxazole-trimethoprim - afebrile, wbc 12.4 (steroids), scr low/stable at 0.43 - monitor closely for hyperkalemia:  K 5 (9/18), K 5.3 >5 (9/19), K 5.3 (9/20)  Plan:  Change from IV to PO Sulfamethoxazole-trimethoprim 2 DS tablets PO TID.    Currently entered as crushable tablets to reduce overall PO volume for patient to take.  However, if crushing tablets creates a problem, Pharmacy can change from tablets to oral suspension.  ID planning to treat for 14 days.  Stop date 9/23 entered. Per Dr. Wynelle Cleveland, give Veltassa 8.4g PO daily.  She will monitor hyperkalemia and d/c Veltassa when needed.   Recheck AM BMET daily.  __________________________________ Height: 6\' 2"  (188 cm) Weight: 187 lb 13.3 oz (85.2 kg) IBW/kg (Calculated) : 82.2  Temp (24hrs), Avg:98 F (36.7 C), Min:97.4 F (36.3 C), Max:99.1 F (37.3 C)  Recent Labs  Lab 09/07/18 0430 09/08/18 0348 09/09/18 0330 09/10/18 0529 09/11/18 1025 09/11/18 1500 09/12/18 0530  WBC 11.4* 11.5* 11.8* 14.1* 12.4*  --   --   CREATININE 0.46* 0.43* 0.42* 0.42* 0.53* 0.52* 0.43*    Estimated Creatinine Clearance: 112.7 mL/min (A) (by C-G formula based on SCr of 0.43 mg/dL (L)).    No Known Allergies   Antimicrobials this admission: 9/5 Vancomycin >> 9/7, resumed 9/8 >> 9/19 9/5 Zosyn >> 9/8 9/6 Levofloxacin >> 9/19 9/8 Meropenem >> 9/9 9/9 Diflucan >> 9/17 9/9 Bactrim >>   Dose adjustments this admission: 9/13 VT at 0926=15         Vpk at 1300 = 37 Ke 0.1062, t/12 6.5 hr, Cmax 43.4, Cmin 14.4. Vd 37 L, CL 65.4. Change to Vanc 1 gm q12 for AUC 509, Cmax 35.6 Cmin 11.1   Microbiology  results: 9/4 BCx (at [facility]): NGF 9/5 MRSA PCR: negative  9/5 BCx x2: NGF 9/6 Urine legionella Ag: negative 9/6 Urine strep pneumo Ag: neg 9/9 Respiratory virus panel: negative  Thank you for allowing pharmacy to be a part of this patient's care.  Gretta Arab PharmD, BCPS Pager [phone] 09/12/2018 1:03 PM

## 2018-09-12 NOTE — Progress Notes (Signed)
Nutrition Follow-up  DOCUMENTATION CODES:   (Will assess for malnutrition at follow-up)  INTERVENTION:  - Continue Premier Protein TID. - Continue Magic Cup with all meals.  - Ordered 6 cups of Magic Cup to be placed in unit freezer for between meals.  - RD will continue to monitor for needs and ways to assist patient and family.  NUTRITION DIAGNOSIS:   Increased nutrient needs related to chronic illness, catabolic illness, cancer and cancer related treatments, acute illness as evidenced by estimated needs. -ongoing  GOAL:   Patient will meet greater than or equal to 90% of their needs -unmet, do not suspect that patient will meet estimated needs.  MONITOR:   PO intake, Supplement acceptance, Weight trends, Labs  ASSESSMENT:   62 year-old with PMH of stage 4 colon cancer with liver mets s/p R colectomy and last chemo on 08/19/18, HTN, and Type 2 DM. Patient admitted with dx of sepsis, pancytopenia, and neutropenia with fever.  Weight stable throughout admission. No intakes documented since RD assessment on 9/17. Patient being provided PO medications/liquid medications by RN at time of RD visit. Spoke with RN prior to visit and she reported that diet was advanced from Dysphagia 1 to Dysphagia 3 to provide patient with more options, but that patient was unable to tolerate chopped consistency so diet changed back to Dysphagia 1. RN states that patient likes Magic Cup and family would like patient to continue receiving this item. She also reports that family has been pureeing and bringing food items that patient enjoys.   Confirmed all of this information with patient's daughter at bedside. She states that this afternoon her mother-in-law is going to bring in pureed items for patient, including tomato bisque. Family and patient deny any additional nutrition-related questions or concerns at this time. Encouraged them to alert RN and RD happy to stop back any time.    Medications reviewed;  sliding scale Novolog, 33 units Lantus/day, 40 mg Solu-medrol BID, 15 mL liquid multivitamin/day, 4 mL Mycostatin QID, 40 mg IV Protonix/day.  Labs reviewed; CBGs: 176 and 266 mg/dL today, Na: 127 mmol/L, K: 5.3 mmol/L, Cl: 91 mmol/L, creatinine: 0.43 mg/dL, Ca: 8.7 mg/dL.        Diet Order:   Diet Order            DIET - DYS 1 Room service appropriate? Yes; Fluid consistency: Thin  Diet effective now              EDUCATION NEEDS:   No education needs have been identified at this time  Skin:  Skin Assessment: Reviewed RN Assessment  Last BM:  9/19  Height:   Ht Readings from Last 1 Encounters:  08/28/18 6\' 2"  (1.88 m)    Weight:   Wt Readings from Last 1 Encounters:  09/11/18 85.2 kg    Ideal Body Weight:  86.36 kg  BMI:  Body mass index is 24.12 kg/m.  Estimated Nutritional Needs:   Kcal:  2525-2780 (30-33 kcal/kg)  Protein:  125-140 grams (~1.5-1.7 grams/kg)  Fluid:  >/= 2.5 L/day     Jarome Matin, MS, RD, LDN, [facility] Inpatient Clinical Dietitian Pager # [phone] After hours/weekend pager # [phone]

## 2018-09-13 LAB — BASIC METABOLIC PANEL
ANION GAP: 12 (ref 5–15)
BUN: 19 mg/dL (ref 8–23)
CO2: 23 mmol/L (ref 22–32)
CREATININE: 0.41 mg/dL — AB (ref 0.61–1.24)
Calcium: 8.5 mg/dL — ABNORMAL LOW (ref 8.9–10.3)
Chloride: 91 mmol/L — ABNORMAL LOW (ref 98–111)
Glucose, Bld: 108 mg/dL — ABNORMAL HIGH (ref 70–99)
Potassium: 4.6 mmol/L (ref 3.5–5.1)
Sodium: 126 mmol/L — ABNORMAL LOW (ref 135–145)

## 2018-09-13 LAB — GLUCOSE, CAPILLARY
GLUCOSE-CAPILLARY: 111 mg/dL — AB (ref 70–99)
GLUCOSE-CAPILLARY: 130 mg/dL — AB (ref 70–99)
GLUCOSE-CAPILLARY: 188 mg/dL — AB (ref 70–99)
GLUCOSE-CAPILLARY: 199 mg/dL — AB (ref 70–99)
GLUCOSE-CAPILLARY: 253 mg/dL — AB (ref 70–99)
Glucose-Capillary: 251 mg/dL — ABNORMAL HIGH (ref 70–99)

## 2018-09-13 LAB — SODIUM, URINE, RANDOM: Sodium, Ur: 99 mmol/L

## 2018-09-13 LAB — CBC
HCT: 28.2 % — ABNORMAL LOW (ref 39.0–52.0)
HEMOGLOBIN: 9.4 g/dL — AB (ref 13.0–17.0)
MCH: 31 pg (ref 26.0–34.0)
MCHC: 33.3 g/dL (ref 30.0–36.0)
MCV: 93.1 fL (ref 78.0–100.0)
PLATELETS: 91 10*3/uL — AB (ref 150–400)
RBC: 3.03 MIL/uL — AB (ref 4.22–5.81)
RDW: 17.9 % — ABNORMAL HIGH (ref 11.5–15.5)
WBC: 15.5 10*3/uL — ABNORMAL HIGH (ref 4.0–10.5)

## 2018-09-13 LAB — OSMOLALITY, URINE: OSMOLALITY UR: 445 mosm/kg (ref 300–900)

## 2018-09-13 MED ORDER — HYDROCOD POLST-CPM POLST ER 10-8 MG/5ML PO SUER
5.0000 mL | Freq: Two times a day (BID) | ORAL | Status: DC | PRN
  Administered 2018-09-13: 5 mL via ORAL
  Filled 2018-09-13: qty 5

## 2018-09-13 MED ORDER — MENTHOL 3 MG MT LOZG
1.0000 | LOZENGE | OROMUCOSAL | Status: DC | PRN
  Filled 2018-09-13: qty 9

## 2018-09-13 NOTE — Progress Notes (Signed)
Patient transferred from ICU. VSS, tele applied. Agree with previous RN assessment. Denies any needs at this time. Will continue to monitor.

## 2018-09-13 NOTE — Progress Notes (Addendum)
Daily Progress Note   Patient Name: Allen Glenn       Date: 09/13/2018 DOB: 05-14-1956  Age: 62 y.o. MRN#: [id number] Attending Physician: Debbe Odea, MD Primary Care Physician: [name], MD Admit Date: 08/28/2018  Reason for Consultation/Follow-up: ongoing goals of care discussions and for assistance with disposition planning, providing psychosocial support  Subjective: Patient is awake alert resting in bed. He is on 3 L of oxygen nasal cannula. He is to be transferred out of the stepdown unit later today. He appears weak and deconditioned. His wife is present at the bedside. He is working on taking in his MiraLax, his bowel regimen has been expanded. He is to take a suppository later today as well.  Discussed extensively with his wife today who is present at the bedside. Subsequently, discussions also held with wife outside the room. See below.  Length of Stay: 16  Current Medications: Scheduled Meds:  . bisacodyl  10 mg Rectal Once  . chlorhexidine  15 mL Mouth Rinse BID  . Chlorhexidine Gluconate Cloth  6 each Topical Daily  . gabapentin  100 mg Oral TID  . insulin aspart  0-15 Units Subcutaneous TID WC  . insulin glargine  33 Units Subcutaneous QHS  . mouth rinse  15 mL Mouth Rinse q12n4p  . methylPREDNISolone (SOLU-MEDROL) injection  40 mg Intravenous Q12H  . multivitamin  15 mL Oral Daily  . nystatin  5 mL Oral QID  . pantoprazole (PROTONIX) IV  40 mg Intravenous Daily  . patiromer  8.4 g Oral Daily  . polyvinyl alcohol  1 drop Both Eyes TID  . protein supplement shake  11 oz Oral TID BM  . sodium chloride flush  3 mL Intravenous Q12H  . sulfamethoxazole-trimethoprim  2 tablet Oral Q8H    Continuous Infusions: . sodium chloride Stopped (08/30/18 0808)  . sodium  chloride 10 mL/hr at 09/13/18 0300    PRN Meds: sodium chloride, sodium chloride, acetaminophen **OR** acetaminophen, diphenoxylate-atropine, levalbuterol, LORazepam, magic mouthwash, metoCLOPramide (REGLAN) injection, morphine injection, nitroGLYCERIN, ondansetron **OR** ondansetron (ZOFRAN) IV, polyethylene glycol, sodium chloride flush, sodium chloride flush, traZODone  Physical Exam         Weak appearing gentleman resting in bed 3 L oxygen nasal cannula Has some skin changes/bruising on his nose He has clear breath sounds anteriorly S1-S  2 Abdomen is soft non distended Has trace edema Awake alert oriented answers questions appropriately  Vital Signs: BP (!) 127/59 (BP Location: Right Arm)   Pulse 99   Temp 98.2 F (36.8 C) (Axillary)   Resp 20   Ht 6' 2"  (1.88 m)   Wt 77.3 kg   SpO2 96%   BMI 21.88 kg/m  SpO2: SpO2: 96 % O2 Device: O2 Device: High Flow Nasal Cannula O2 Flow Rate: O2 Flow Rate (L/min): 5 L/min  Intake/output summary:   Intake/Output Summary (Last 24 hours) at 09/13/2018 1058 Last data filed at 09/13/2018 0630 Gross per 24 hour  Intake 687.32 ml  Output 2000 ml  Net -1312.68 ml   LBM: Last BM Date: 09/12/18 Baseline Weight: Weight: 84.2 kg Most recent weight: Weight: 77.3 kg       Palliative Assessment/Data:    Flowsheet Rows     Most Recent Value  Intake Tab  Referral Department  Hospitalist  Unit at Time of Referral  ICU  Palliative Care Primary Diagnosis  Cancer  Date Notified  09/01/18  Palliative Care Type  Return patient Palliative Care  Reason for referral  Clarify Goals of Care  Date of Admission  08/28/18  Date first seen by Palliative Care  09/01/18  # of days Palliative referral response time  0 Day(s)  # of days IP prior to Palliative referral  4  Clinical Assessment  Psychosocial & Spiritual Assessment  Palliative Care Outcomes      Patient Active Problem List   Diagnosis Date Noted  . Hyponatremia   . Hyperkalemia     . Hypoxia   . Acute respiratory failure with hypoxia ([place]) 09/01/2018  . Electrolyte imbalance 09/01/2018  . Pneumonia 09/01/2018  . Atherosclerosis of aorta ([place]) 09/01/2018  . Chemotherapy-induced neutropenia ([place])   . Fever 08/28/2018  . Sepsis ([place]) 08/28/2018  . Pancytopenia ([place]) 07/17/2018  . Cancer related pain   . Palliative care by specialist   . DNR (do not resuscitate) discussion 06/11/2018  . Insulin dependent type 2 diabetes mellitus, uncontrolled ([place]) 06/05/2018  . Coronary artery disease 06/05/2018  . Hyperlipidemia 06/05/2018  . 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[place]) 06/05/2018  . Hypertension 06/05/2018  . Anemia 06/05/2018  . Brain metastases ([place]) 06/02/2018  . Elevated alkaline phosphatase level   . Other chronic pancreatitis ([place]) 05/20/2018  . Metastatic colon cancer to liver ([place]) 05/20/2018  . Pulmonary metastasis ([place]) 12/20/2016  . Colon cancer s/p right colectomy  06/13/2016  . Chronic diastolic heart failure ([place]) 06/13/2016  . HTN (hypertension) 05/03/2015  . Symptomatic anemia 04/09/2015  . Iron deficiency anemia 04/09/2015  . CAD s/p PCI LAD 06/2013 [facility] 04/09/2015  . Lumbar radiculopathy 03/07/2015  . Hereditary and idiopathic peripheral neuropathy 03/07/2015    Palliative Care Assessment & Plan   Patient Profile:    Assessment: Acute hypoxic respiratory failure, status post BiPAP use in this hospitalization Life limiting illness of colon cancer status post right colectomy with known metastatic burden to lungs and bones liver and brain Dysphagia  Ongoing supplemental oxygen needs Functional decline Severe deconditioning   Recommendations/Plan:  Family meeting:  Met with the patient and his wife at the bedside. Subsequently, discussions also held with the wife outside the room. We reviewed the patient's current condition in nature of this hospitalization.   Goals wishes and values discussions undertaken. Various  disposition options also discussed in great detail. Introduced and discussed extensively about hospice philosophy of care. The type of  care that can be provided in a residential hospice setting such as [facility] through [place] discussed with wife in detail. Hospice focuses on comfort measures at end-of-life. Typically, in a hospice setting, the focus is not on IV antibiotics or rehabilitation efforts.   Alternatively,the type of care that can be provided in a skilled nursing facility in a rehabilitation attempt also discussed. Wife states that [facility] skilled nursing facility has rehabilitation unit that she is considering if the patient can tolerate therapy. Wife was asking about Inpatient rehabilitation. Discussed about strenuous physical therapy that patient needs to be able to tolerate for inpatient rehabilitation.   Wife is tearful. She states that she wishes there was a way to bring the patient home with hospice support. She realizes that hospice is not available 24/7 in her home setting she lives in [place], [place] with her husband. She has children who live in the area, in [place] and in [place], [place]. However she does not have 24 7 support at home. She also has to work. She has a lot of of caregiver burden and guilt about not being able to take care of the patient at home with hospice support.  Encouraged her to take respite breaks, encouraged her to get enough sleep and eat and take her medications.  Patient is to be transferred out of the stepdown unit today. Discussed with patient and wife about step wise approach, continuing to take it one day at a time. Wife is requesting for physical therapy to reevaluate the patient over the weekend. Continue current mode of care, patient undergoing bowel regimen today.  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 She becomes  tearful and states that she does not want for the patient to have false hope. I discussed with her in detail about the patient's current situation. Offered active listening, offered supportive care. All of her concerns addressed to the best of my ability.  Code Status:    Code Status Orders  (From admission, onward)         Start     Ordered   09/02/18 0120  Do not attempt resuscitation (DNR)  Continuous    Question Answer Comment  In the event of cardiac or respiratory ARREST Do not call a "code blue"   In the event of cardiac or respiratory ARREST Do not perform Intubation, CPR, defibrillation or ACLS   In the event of cardiac or respiratory ARREST Use medication by any route, position, wound care, and other measures to relive pain and suffering. May use oxygen, suction and manual treatment of airway obstruction as needed for comfort.   Comments would like to continue antibiotics and other medications      09/02/18 0120        Code Status History    Date Active Date Inactive Code Status Order ID Comments User Context   08/28/2018 1346 09/02/2018 0120 Full Code [id number]  Roxan Hockey, MD Inpatient   07/14/2018 1641 07/18/2018 1712 Full Code [id number]  Kerney Elbe, DO Inpatient   06/04/2018 1309 06/06/2018 1353 Full Code [id number]  Erline Levine, MD Inpatient   05/20/2018 0119 05/26/2018 1824 Full Code [id number]  Rise Patience, MD Inpatient   11/28/2016 1451 11/29/2016 1138 Full Code [id number]  Nani Skillern, PA-C Inpatient   04/09/2015 1527 04/10/2015 2320 Full Code [id number]  Nita Sells, MD Inpatient    Advance Directive  Documentation     Most Recent Value  Type of Advance Directive  Healthcare Power of Attorney  Pre-existing out of facility DNR order (yellow form or pink MOST form)  -  "MOST" Form in Place?  -      PPS 30% Prognosis:   guarded   Discharge Planning:  To Be Determined  Care plan was discussed with  Patient, wife, RN.   Thank  you for allowing the Palliative Medicine Team to assist in the care of this patient.   Time In: 9 Time Out: 9.35 Total Time 35 Prolonged Time Billed  no       Greater than 50%  of this time was spent counseling and coordinating care related to the above assessment and plan.  Loistine Chance, MD [phone]  Please contact Palliative Medicine Team phone at [phone] for questions and concerns.

## 2018-09-13 NOTE — Progress Notes (Addendum)
PROGRESS NOTE    Allen Glenn   STM:[id number]  DOB: 04/22/1956  DOA: 08/28/2018 PCP: [name], MD   Brief Narrative:  Allen Glenn is a 62 year old male with hypertension, coronary artery disease, diabetes mellitus, metastatic colon cancer on chemotherapy who presented to the hospital for fever of 102 degrees, full oxygen level and fast heart rate.  Noted to be febrile and hypoxic in the ED and was found to have an elevated lactic acid.  He was admitted for sepsis suspected to be due to pneumonia.    Subjective: Oral intake is improving per his wife but not at a level which should be adequate. He still fatigues easily. He still does not participate in conversation with me and chooses to keep his eyes closed while his wife speaks. He has no complaints today.   Assessment & Plan:   Principal Problem:   Acute respiratory failure with hypoxia  - suspected to be bilateral pneumonia progressing to ARDS - the patient was maintained on a BiPAP and did not need intubation -CT chest performed 9/7 shows extensive opacities throughout both lungs. Small bilateral pleural effusion and bilateral lung nodule compatible with metastatic disease. - Solumedrol being used to treat possible pneumonitis and now being tapered- has about 10 days left  - - ID assisting with management of antibiotics-  Levofloxacin to be stopped today- Vancomycin stopped on 9/18 - Bactrim to be used a prophylaxis- currently on it IV- will change to oral in the next day or 2 based on how well he is swallowing - have asked PCCM to look in on him again>  recommended to try off BiPAP at night which he appears to be doing well with - has been weaned down to 3 L O2- will transfer him out of SDU today to a tele bed (as his is having issues with hyperkalemia)   Active Problems:   Colon cancer s/p right colectomy with metastasis to the lungs, bone, liver and brain - above episode of respiratory failure occurred after the patient  received his first chemotherapy -For now chemotherapy has been on hold as respiratory issues are being treated -He is managed by [name] who tells me that he is no longer a candidate for further chemotherapy  Dysphagia - on D 1 diet due to being a high aspiration risk- will try to advanced to mechanical soft as he is craving more solid food- patient and family understand aspiration risk which I have discussed with them in detail  Hypotension with h/o HTN (hypertension) - Lisinopril on hold   Insulin dependent type 2 diabetes mellitus, uncontrolled  - cont ISS AC/HS and Lantus - Last A1c 7.9.  - sugars high due to steroids- increased Lantus - sugars slightly better controlled today   Hyponatremia - d/c Lasix on 9/18 as he appeared dehydrated    - has grade 1 d CHF and was not on Lasix at home - sodium is 127- dropping lower today to 126   - ? If this is related to Bactrim- he has been receiving about 1.5 L of infusions with Bactrim daily as well - have switched to oral Bactrim - check U sodium and osmolality today    Hyperkalemia - ongoing hyperkalemia- possibly due to Bactrim  - receiving Valtessa daily for now- cont telemetry monitoring    Normocytic anemia - likely AOCD- follow  Thrombocytopenia - noted to have a drop in platelets on lab work on 7/22 and has remained low since- - Platelets  have been stable at 60-90 range follow  Severe deconditioning - needs hoyer lift to get out of bed and can barely finish a solid meal  Severe fatigue/ sedated - changed bedtime Ativan to PRN- weaned down Neurontin- slightly more alert now  Disposition: planning for home with hospice vs hospice home- appreciate palliative care team's assistance  DVT prophylaxis: SCDs Code Status: DNR Family Communication: wife Disposition Plan: Transfer to telemetry today Consultants:   Oncology  ID  Palliative Care Procedures:   none Antimicrobials:  Anti-infectives (From admission,  onward)   Start     Dose/Rate Route Frequency Ordered Stop   09/12/18 2200  sulfamethoxazole-trimethoprim (BACTRIM DS,SEPTRA DS) 800-160 MG per tablet 2 tablet     2 tablet Oral Every 8 hours 09/12/18 1224 09/15/18 2159   09/05/18 2200  vancomycin (VANCOCIN) IVPB 1000 mg/200 mL premix  Status:  Discontinued     1,000 mg 200 mL/hr over 60 Minutes Intravenous Every 12 hours 09/05/18 1353 09/10/18 1410   09/01/18 1800  sulfamethoxazole-trimethoprim (BACTRIM) 420 mg in dextrose 5 % 500 mL IVPB  Status:  Discontinued     420 mg 350.8 mL/hr over 90 Minutes Intravenous Every 8 hours 09/01/18 1529 09/12/18 1345   09/01/18 1000  fluconazole (DIFLUCAN) IVPB 400 mg  Status:  Discontinued     400 mg 100 mL/hr over 120 Minutes Intravenous Every 24 hours 09/01/18 0838 09/09/18 1512   08/31/18 2200  vancomycin (VANCOCIN) 1,250 mg in sodium chloride 0.9 % 250 mL IVPB  Status:  Discontinued     1,250 mg 166.7 mL/hr over 90 Minutes Intravenous Every 12 hours 08/31/18 0852 09/05/18 1353   08/31/18 1000  meropenem (MERREM) 1 g in sodium chloride 0.9 % 100 mL IVPB  Status:  Discontinued     1 g 200 mL/hr over 30 Minutes Intravenous Every 8 hours 08/31/18 0848 09/01/18 1546   08/31/18 1000  vancomycin (VANCOCIN) 1,750 mg in sodium chloride 0.9 % 500 mL IVPB     1,750 mg 250 mL/hr over 120 Minutes Intravenous  Once 08/31/18 0852 08/31/18 1146   08/29/18 1400  levofloxacin (LEVAQUIN) IVPB 750 mg     750 mg 100 mL/hr over 90 Minutes Intravenous Every 24 hours 08/29/18 1353 09/11/18 1400   08/28/18 2000  vancomycin (VANCOCIN) 1,250 mg in sodium chloride 0.9 % 250 mL IVPB  Status:  Discontinued     1,250 mg 166.7 mL/hr over 90 Minutes Intravenous Every 12 hours 08/28/18 1725 08/30/18 0853   08/28/18 1500  piperacillin-tazobactam (ZOSYN) IVPB 3.375 g  Status:  Discontinued     3.375 g 12.5 mL/hr over 240 Minutes Intravenous Every 8 hours 08/28/18 1245 08/31/18 0844   08/28/18 0900  vancomycin (VANCOCIN) 1,500 mg  in sodium chloride 0.9 % 500 mL IVPB     1,500 mg 250 mL/hr over 120 Minutes Intravenous  Once 08/28/18 0854 08/28/18 1218   08/28/18 0900  piperacillin-tazobactam (ZOSYN) IVPB 3.375 g     3.375 g 100 mL/hr over 30 Minutes Intravenous  Once 08/28/18 0854 08/28/18 0935       Objective: Vitals:   09/13/18 0400 09/13/18 0429 09/13/18 0600 09/13/18 0800  BP: (!) 119/59   (!) 127/59  Pulse: (!) 103  99 99  Resp: (!) 22  (!) 21 20  Temp:  98 F (36.7 C)  98.2 F (36.8 C)  TempSrc:  Axillary  Axillary  SpO2: 96%  95% 96%  Weight:      Height:  Intake/Output Summary (Last 24 hours) at 09/13/2018 1101 Last data filed at 09/13/2018 0630 Gross per 24 hour  Intake 687.32 ml  Output 2000 ml  Net -1312.68 ml   Filed Weights   09/09/18 1128 09/11/18 1900 09/13/18 0346  Weight: 86.1 kg 85.2 kg 77.3 kg    Examination: General exam: Appears comfortable - sitting up in the bed- not participating much in conversation- answers questions appropriately HEENT: PERRLA, oral mucosa dry, no sclera icterus or thrush Respiratory system: Clear to auscultation. Respiratory effort normal. Cardiovascular system: S1 & S2 heard, RRR.   Gastrointestinal system: Abdomen soft, non-tender, nondistended. Normal bowel sound. No organomegaly Central nervous system: Alert and oriented but falls asleep very easily- poorly communicative- No focal neurological deficits. Extremities: No cyanosis, clubbing or edema Skin: No rashes or ulcers Psychiatry:  Flat affect   Data Reviewed: I have personally reviewed following labs and imaging studies  CBC: Recent Labs  Lab 09/07/18 0430 09/08/18 0348 09/09/18 0330 09/10/18 0529 09/11/18 1025 09/13/18 0400  WBC 11.4* 11.5* 11.8* 14.1* 12.4* 15.5*  NEUTROABS 11.4* 11.1* 11.4* 13.5*  --   --   HGB 8.4* 8.4* 9.0* 9.3* 9.5* 9.4*  HCT 25.6* 25.5* 27.1* 28.3* 28.9* 28.2*  MCV 93.4 93.1 92.8 93.1 93.8 93.1  PLT 69* 66* 72* 87* 87* 91*   Basic Metabolic  Panel: Recent Labs  Lab 09/07/18 0430 09/08/18 0348 09/09/18 0330 09/10/18 0529 09/11/18 1025 09/11/18 1500 09/12/18 0530 09/13/18 0400  NA 131* 127* 128* 129* 127* 127* 127* 126*  K 4.4 4.8 4.2 5.0 5.3* 5.0 5.3* 4.6  CL 96* 92* 93* 94* 91* 90* 91* 91*  CO2 23 23 23 23  20* 21* 22 23  GLUCOSE 186* 145* 160* 154* 302* 243* 156* 108*  BUN 21 19 23  24* 17 17 18 19   CREATININE 0.46* 0.43* 0.42* 0.42* 0.53* 0.52* 0.43* 0.41*  CALCIUM 8.2* 7.9* 8.1* 8.4* 8.4* 8.4* 8.7* 8.5*  MG 2.5* 2.5* 2.6* 2.7*  --   --   --   --   PHOS 3.5 3.4 3.7 3.4  --   --   --   --    GFR: Estimated Creatinine Clearance: 106 mL/min (A) (by C-G formula based on SCr of 0.41 mg/dL (L)). Liver Function Tests: Recent Labs  Lab 09/07/18 0430 09/08/18 0348 09/09/18 0330 09/10/18 0529  AST 30 26 28 28   ALT 27 24 25 25   ALKPHOS 196* 176* 187* 177*  BILITOT 0.4 0.2* 0.2* 0.4  PROT 5.0* 4.9* 4.9* 5.1*  ALBUMIN 1.9* 1.8* 2.1* 2.2*   No results for input(s): LIPASE, AMYLASE in the last 168 hours. No results for input(s): AMMONIA in the last 168 hours. Coagulation Profile: No results for input(s): INR, PROTIME in the last 168 hours. Cardiac Enzymes: No results for input(s): CKTOTAL, CKMB, CKMBINDEX, TROPONINI in the last 168 hours. BNP (last 3 results) No results for input(s): PROBNP in the last 8760 hours. HbA1C: No results for input(s): HGBA1C in the last 72 hours. CBG: Recent Labs  Lab 09/12/18 1140 09/12/18 1600 09/12/18 2013 09/13/18 0420 09/13/18 0757  GLUCAP 250* 171* 182* 111* 188*   Lipid Profile: No results for input(s): CHOL, HDL, LDLCALC, TRIG, CHOLHDL, LDLDIRECT in the last 72 hours. Thyroid Function Tests: No results for input(s): TSH, T4TOTAL, FREET4, T3FREE, THYROIDAB in the last 72 hours. Anemia Panel: No results for input(s): VITAMINB12, FOLATE, FERRITIN, TIBC, IRON, RETICCTPCT in the last 72 hours. Urine analysis:    Component Value Date/Time   COLORURINE YELLOW  08/28/2018  [place] 08/28/2018 1051   LABSPEC 1.015 08/28/2018 1051   PHURINE 6.0 08/28/2018 1051   GLUCOSEU 150 (A) 08/28/2018 1051   HGBUR NEGATIVE 08/28/2018 1051   BILIRUBINUR NEGATIVE 08/28/2018 [place] 08/28/2018 1051   PROTEINUR NEGATIVE 08/28/2018 1051   NITRITE NEGATIVE 08/28/2018 1051   LEUKOCYTESUR NEGATIVE 08/28/2018 1051   Sepsis Labs: @LABRCNTIP (procalcitonin:4,lacticidven:4) ) No results found for this or any previous visit (from the past 240 hour(s)).       Radiology Studies: No results found.    Scheduled Meds: . bisacodyl  10 mg Rectal Once  . chlorhexidine  15 mL Mouth Rinse BID  . Chlorhexidine Gluconate Cloth  6 each Topical Daily  . gabapentin  100 mg Oral TID  . insulin aspart  0-15 Units Subcutaneous TID WC  . insulin glargine  33 Units Subcutaneous QHS  . mouth rinse  15 mL Mouth Rinse q12n4p  . methylPREDNISolone (SOLU-MEDROL) injection  40 mg Intravenous Q12H  . multivitamin  15 mL Oral Daily  . nystatin  5 mL Oral QID  . pantoprazole (PROTONIX) IV  40 mg Intravenous Daily  . patiromer  8.4 g Oral Daily  . polyvinyl alcohol  1 drop Both Eyes TID  . protein supplement shake  11 oz Oral TID BM  . sodium chloride flush  3 mL Intravenous Q12H  . sulfamethoxazole-trimethoprim  2 tablet Oral Q8H   Continuous Infusions: . sodium chloride Stopped (08/30/18 0808)  . sodium chloride 10 mL/hr at 09/13/18 0300     LOS: 16 days    Time spent in minutes: 30    Debbe Odea, MD [facility] Pager: [email or web address] Password [email or web address] 09/13/2018, 11:01 AM

## 2018-09-14 LAB — BASIC METABOLIC PANEL
Anion gap: 13 (ref 5–15)
Anion gap: 11 (ref 5–15)
BUN: 16 mg/dL (ref 8–23)
BUN: 15 mg/dL (ref 8–23)
CALCIUM: 8.4 mg/dL — AB (ref 8.9–10.3)
CALCIUM: 8.3 mg/dL — AB (ref 8.9–10.3)
CHLORIDE: 96 mmol/L — AB (ref 98–111)
CO2: 22 mmol/L (ref 22–32)
CO2: 22 mmol/L (ref 22–32)
CREATININE: 0.39 mg/dL — AB (ref 0.61–1.24)
CREATININE: 0.37 mg/dL — AB (ref 0.61–1.24)
Chloride: 92 mmol/L — ABNORMAL LOW (ref 98–111)
GFR calc Af Amer: >60 mL/min (ref >60)
GFR calc non Af Amer: >60 mL/min (ref >60)
GFR calc non Af Amer: >60 mL/min (ref >60)
Glucose, Bld: 160 mg/dL — ABNORMAL HIGH (ref 70–99)
Glucose, Bld: 136 mg/dL — ABNORMAL HIGH (ref 70–99)
Potassium: 4.9 mmol/L (ref 3.5–5.1)
Potassium: 4.4 mmol/L (ref 3.5–5.1)
SODIUM: 127 mmol/L — AB (ref 135–145)
Sodium: 129 mmol/L — ABNORMAL LOW (ref 135–145)

## 2018-09-14 LAB — GLUCOSE, CAPILLARY
GLUCOSE-CAPILLARY: 155 mg/dL — AB (ref 70–99)
GLUCOSE-CAPILLARY: 135 mg/dL — AB (ref 70–99)
Glucose-Capillary: 213 mg/dL — ABNORMAL HIGH (ref 70–99)
Glucose-Capillary: 131 mg/dL — ABNORMAL HIGH (ref 70–99)

## 2018-09-14 MED ORDER — SODIUM CHLORIDE 0.9 % IV SOLN
INTRAVENOUS | Status: DC
  Administered 2018-09-14 – 2018-09-20 (×11): via INTRAVENOUS

## 2018-09-14 MED ORDER — METHYLPREDNISOLONE SODIUM SUCC 40 MG IJ SOLR
40.0000 mg | INTRAMUSCULAR | Status: DC
  Administered 2018-09-15: 40 mg via INTRAVENOUS
  Filled 2018-09-14: qty 1

## 2018-09-14 NOTE — Progress Notes (Signed)
PROGRESS NOTE    KEOLA HENINGER   KYH:[id number]  DOB: 02-16-1956  DOA: 08/28/2018 PCP: [name], MD   Brief Narrative:  Allen Glenn is a 62 year old male with hypertension, coronary artery disease, diabetes mellitus, metastatic colon cancer on chemotherapy who presented to the hospital for fever of 102 degrees, full oxygen level and fast heart rate.  Noted to be febrile and hypoxic in the ED and was found to have an elevated lactic acid.  He was admitted for sepsis suspected to be due to pneumonia.    Subjective: More alert and communicative today. States food has no taste and he is not hungry.   Assessment & Plan:   Principal Problem:   Acute respiratory failure with hypoxia  - suspected to be bilateral pneumonia progressing to ARDS - the patient was maintained on a BiPAP and did not need intubation -CT chest performed 9/7 shows extensive opacities throughout both lungs. Small bilateral pleural effusion and bilateral lung nodule compatible with metastatic disease. - - ID assisting with management of antibiotics-  Levofloxacin stopped 9/17- Vancomycin stopped on 9/18 - Bactrim to be continued for now - weaning steroids  - has been weaned down to 3 L O2-  - will continue to wean as able- encourage IS and OOB daily   Active Problems:   Colon cancer s/p right colectomy with metastasis to the lungs, bone, liver and brain - above episode of respiratory failure occurred after the patient received his first chemotherapy -For now chemotherapy has been on hold as respiratory issues are being treated -He is managed by [name] who tells me that he is no longer a candidate for further chemotherapy  Dysphagia - on D 1 diet due to being a high aspiration risk- will try to advanced to mechanical soft as he is craving more solid food- patient and family understand aspiration risk which I have discussed with them in detail  Hypotension with h/o HTN (hypertension) - Lisinopril on  hold   Insulin dependent type 2 diabetes mellitus, uncontrolled  - cont ISS AC/HS and Lantus - Last A1c 7.9.  - sugars high due to steroids- increased Lantus     Hyponatremia - d/c Lasix on 9/18 as he appeared dehydrated    - has grade 1 d CHF and was not on Lasix at home - sodium has dropped down to 126-127 range   - ? If this is related to Bactrim- he has been receiving about 1.5 L of infusions with Bactrim daily as well - have switched to oral Bactrim -   I suspect Bactrim is causing the increase natriuresis - will start IVF to combat this   Hyperkalemia - ongoing hyperkalemia- possibly due to Bactrim  - receiving Valtessa daily for now- K 4.9- cont telemetry monitoring    Normocytic anemia - likely AOCD- follow  Thrombocytopenia - noted to have a drop in platelets on lab work on 7/22 and has remained low since- - Platelets have been stable at 60-90 range follow  Severe deconditioning - needs hoyer lift to get out of bed and can barely finish a solid meal  Severe fatigue/ sedated - changed bedtime Ativan to PRN- weaned down Neurontin- slightly more alert now  Disposition: planning for home with hospice vs hospice home- appreciate palliative care team's assistance  DVT prophylaxis: SCDs Code Status: DNR Family Communication: wife Disposition Plan: follow in hospital for now Consultants:   Oncology  ID  Palliative Care Procedures:   none Antimicrobials:  Anti-infectives (  From admission, onward)   Start     Dose/Rate Route Frequency Ordered Stop   09/12/18 2200  sulfamethoxazole-trimethoprim (BACTRIM DS,SEPTRA DS) 800-160 MG per tablet 2 tablet     2 tablet Oral Every 8 hours 09/12/18 1224 09/15/18 2159   09/05/18 2200  vancomycin (VANCOCIN) IVPB 1000 mg/200 mL premix  Status:  Discontinued     1,000 mg 200 mL/hr over 60 Minutes Intravenous Every 12 hours 09/05/18 1353 09/10/18 1410   09/01/18 1800  sulfamethoxazole-trimethoprim (BACTRIM) 420 mg in dextrose 5 %  500 mL IVPB  Status:  Discontinued     420 mg 350.8 mL/hr over 90 Minutes Intravenous Every 8 hours 09/01/18 1529 09/12/18 1345   09/01/18 1000  fluconazole (DIFLUCAN) IVPB 400 mg  Status:  Discontinued     400 mg 100 mL/hr over 120 Minutes Intravenous Every 24 hours 09/01/18 0838 09/09/18 1512   08/31/18 2200  vancomycin (VANCOCIN) 1,250 mg in sodium chloride 0.9 % 250 mL IVPB  Status:  Discontinued     1,250 mg 166.7 mL/hr over 90 Minutes Intravenous Every 12 hours 08/31/18 0852 09/05/18 1353   08/31/18 1000  meropenem (MERREM) 1 g in sodium chloride 0.9 % 100 mL IVPB  Status:  Discontinued     1 g 200 mL/hr over 30 Minutes Intravenous Every 8 hours 08/31/18 0848 09/01/18 1546   08/31/18 1000  vancomycin (VANCOCIN) 1,750 mg in sodium chloride 0.9 % 500 mL IVPB     1,750 mg 250 mL/hr over 120 Minutes Intravenous  Once 08/31/18 0852 08/31/18 1146   08/29/18 1400  levofloxacin (LEVAQUIN) IVPB 750 mg     750 mg 100 mL/hr over 90 Minutes Intravenous Every 24 hours 08/29/18 1353 09/11/18 1400   08/28/18 2000  vancomycin (VANCOCIN) 1,250 mg in sodium chloride 0.9 % 250 mL IVPB  Status:  Discontinued     1,250 mg 166.7 mL/hr over 90 Minutes Intravenous Every 12 hours 08/28/18 1725 08/30/18 0853   08/28/18 1500  piperacillin-tazobactam (ZOSYN) IVPB 3.375 g  Status:  Discontinued     3.375 g 12.5 mL/hr over 240 Minutes Intravenous Every 8 hours 08/28/18 1245 08/31/18 0844   08/28/18 0900  vancomycin (VANCOCIN) 1,500 mg in sodium chloride 0.9 % 500 mL IVPB     1,500 mg 250 mL/hr over 120 Minutes Intravenous  Once 08/28/18 0854 08/28/18 1218   08/28/18 0900  piperacillin-tazobactam (ZOSYN) IVPB 3.375 g     3.375 g 100 mL/hr over 30 Minutes Intravenous  Once 08/28/18 0854 08/28/18 0935       Objective: Vitals:   09/13/18 1523 09/13/18 2015 09/14/18 0546 09/14/18 0554  BP: 115/73 109/68 116/69   Pulse: (!) 123 (!) 108 (!) 102   Resp: 16 16 20    Temp: 98.1 F (36.7 C) 97.6 F (36.4 C)  97.8 F (36.6 C)   TempSrc: Oral Oral Oral   SpO2: 93% 94% 95%   Weight:    77.4 kg  Height:        Intake/Output Summary (Last 24 hours) at 09/14/2018 1159 Last data filed at 09/14/2018 0900 Gross per 24 hour  Intake 397.8 ml  Output 1701 ml  Net -1303.2 ml   Filed Weights   09/11/18 1900 09/13/18 0346 09/14/18 0554  Weight: 85.2 kg 77.3 kg 77.4 kg    Examination: General exam: Appears comfortable - sitting up in the bed- not participating much in conversation- answers questions appropriately HEENT: PERRLA, oral mucosa dry, no sclera icterus or thrush Respiratory system: Clear  to auscultation. Respiratory effort normal. Cardiovascular system: S1 & S2 heard, RRR.   Gastrointestinal system: Abdomen soft, non-tender, nondistended. Normal bowel sound. No organomegaly Central nervous system: Alert and oriented today- very communicative- No focal neurological deficits. Extremities: No cyanosis, clubbing or edema Skin: No rashes or ulcers Psychiatry:  Normal affect   Data Reviewed: I have personally reviewed following labs and imaging studies  CBC: Recent Labs  Lab 09/08/18 0348 09/09/18 0330 09/10/18 0529 09/11/18 1025 09/13/18 0400  WBC 11.5* 11.8* 14.1* 12.4* 15.5*  NEUTROABS 11.1* 11.4* 13.5*  --   --   HGB 8.4* 9.0* 9.3* 9.5* 9.4*  HCT 25.5* 27.1* 28.3* 28.9* 28.2*  MCV 93.1 92.8 93.1 93.8 93.1  PLT 66* 72* 87* 87* 91*   Basic Metabolic Panel: Recent Labs  Lab 09/08/18 0348 09/09/18 0330 09/10/18 0529 09/11/18 1025 09/11/18 1500 09/12/18 0530 09/13/18 0400 09/14/18 0447  NA 127* 128* 129* 127* 127* 127* 126* 127*  K 4.8 4.2 5.0 5.3* 5.0 5.3* 4.6 4.9  CL 92* 93* 94* 91* 90* 91* 91* 92*  CO2 23 23 23  20* 21* 22 23 22   GLUCOSE 145* 160* 154* 302* 243* 156* 108* 160*  BUN 19 23 24* 17 17 18 19 16   CREATININE 0.43* 0.42* 0.42* 0.53* 0.52* 0.43* 0.41* 0.39*  CALCIUM 7.9* 8.1* 8.4* 8.4* 8.4* 8.7* 8.5* 8.4*  MG 2.5* 2.6* 2.7*  --   --   --   --   --   PHOS  3.4 3.7 3.4  --   --   --   --   --    GFR: Estimated Creatinine Clearance: 106.2 mL/min (A) (by C-G formula based on SCr of 0.39 mg/dL (L)). Liver Function Tests: Recent Labs  Lab 09/08/18 0348 09/09/18 0330 09/10/18 0529  AST 26 28 28   ALT 24 25 25   ALKPHOS 176* 187* 177*  BILITOT 0.2* 0.2* 0.4  PROT 4.9* 4.9* 5.1*  ALBUMIN 1.8* 2.1* 2.2*   No results for input(s): LIPASE, AMYLASE in the last 168 hours. No results for input(s): AMMONIA in the last 168 hours. Coagulation Profile: No results for input(s): INR, PROTIME in the last 168 hours. Cardiac Enzymes: No results for input(s): CKTOTAL, CKMB, CKMBINDEX, TROPONINI in the last 168 hours. BNP (last 3 results) No results for input(s): PROBNP in the last 8760 hours. HbA1C: No results for input(s): HGBA1C in the last 72 hours. CBG: Recent Labs  Lab 09/13/18 1217 09/13/18 1637 09/13/18 2017 09/13/18 2338 09/14/18 0754  GLUCAP 251* 130* 199* 253* 213*   Lipid Profile: No results for input(s): CHOL, HDL, LDLCALC, TRIG, CHOLHDL, LDLDIRECT in the last 72 hours. Thyroid Function Tests: No results for input(s): TSH, T4TOTAL, FREET4, T3FREE, THYROIDAB in the last 72 hours. Anemia Panel: No results for input(s): VITAMINB12, FOLATE, FERRITIN, TIBC, IRON, RETICCTPCT in the last 72 hours. Urine analysis:    Component Value Date/Time   COLORURINE YELLOW 08/28/2018 1051   APPEARANCEUR CLEAR 08/28/2018 1051   LABSPEC 1.015 08/28/2018 1051   PHURINE 6.0 08/28/2018 1051   GLUCOSEU 150 (A) 08/28/2018 1051   HGBUR NEGATIVE 08/28/2018 1051   BILIRUBINUR NEGATIVE 08/28/2018 1051   KETONESUR NEGATIVE 08/28/2018 1051   PROTEINUR NEGATIVE 08/28/2018 1051   NITRITE NEGATIVE 08/28/2018 1051   LEUKOCYTESUR NEGATIVE 08/28/2018 1051   Sepsis Labs: @LABRCNTIP (procalcitonin:4,lacticidven:4) ) No results found for this or any previous visit (from the past 240 hour(s)).       Radiology Studies: No results found.    Scheduled  Meds: .  bisacodyl  10 mg Rectal Once  . chlorhexidine  15 mL Mouth Rinse BID  . Chlorhexidine Gluconate Cloth  6 each Topical Daily  . gabapentin  100 mg Oral TID  . insulin aspart  0-15 Units Subcutaneous TID WC  . insulin glargine  33 Units Subcutaneous QHS  . mouth rinse  15 mL Mouth Rinse q12n4p  . [START ON 09/15/2018] methylPREDNISolone (SOLU-MEDROL) injection  40 mg Intravenous Q24H  . multivitamin  15 mL Oral Daily  . nystatin  5 mL Oral QID  . pantoprazole (PROTONIX) IV  40 mg Intravenous Daily  . patiromer  8.4 g Oral Daily  . polyvinyl alcohol  1 drop Both Eyes TID  . protein supplement shake  11 oz Oral TID BM  . sodium chloride flush  3 mL Intravenous Q12H  . sulfamethoxazole-trimethoprim  2 tablet Oral Q8H   Continuous Infusions: . sodium chloride Stopped (08/30/18 0808)  . sodium chloride 10 mL/hr at 09/14/18 0600  . sodium chloride 75 mL/hr at 09/14/18 0852     LOS: 17 days    Time spent in minutes: 30    Debbe Odea, MD [facility] Pager: [email or web address] Password [facility] 09/14/2018, 11:59 AM

## 2018-09-14 NOTE — Evaluation (Signed)
Physical Therapy Evaluation Patient Details Name: Allen Glenn MRN: [id number] DOB: October 07, 1956 Today's Date: 09/14/2018   History of Present Illness  Pt admitted with sepsis/PNA and with hx significant of lumbar compression fx late June 2019,  hypertension, diabetes mellitus type 2, BPH, history of diastolic CHF, hyperlipidemia, CAD, metastatic colon cancer with brain metastasis as well as thoracic disease.  Clinical Impression  Pt admitted with above diagnosis. Pt currently with functional limitations due to the deficits listed below (see PT Problem List). +2 total assist for supine to sit, mod assist for static sitting 2* posterior and R lateral lean. Pt sat on edge of bed x 6 minutes working on sitting balance.  Pt will benefit from skilled PT to increase their independence and safety with mobility to allow discharge to the venue listed below.       Follow Up Recommendations SNF;Supervision/Assistance - 24 hour    Equipment Recommendations  None recommended by PT    Recommendations for Other Services       Precautions / Restrictions Precautions Precautions: Fall Precaution Comments: Elevated HR Restrictions Weight Bearing Restrictions: No      Mobility  Bed Mobility Overal bed mobility: Needs Assistance Bed Mobility: Rolling;Sidelying to Sit Rolling: +2 for physical assistance;Total assist Sidelying to sit: +2 for physical assistance;Total assist     Sit to sidelying: Total assist;+2 for physical assistance General bed mobility comments: pt 15%, total assist to advance LEs and raise trunk; pt sat at EOB x 6 minutes with mod assist for balance 2* posterior and R lateral lean  Transfers                    Ambulation/Gait                Stairs            Wheelchair Mobility    Modified Rankin (Stroke Patients Only)       Balance Overall balance assessment: Needs assistance Sitting-balance support: Feet supported;Bilateral upper extremity  supported Sitting balance-Leahy Scale: Poor Sitting balance - Comments: mod A for static sitting 2* posterior and R lateral lean, sat at EOB x 6 minutes, VCs and manual cues to lift head Postural control: Posterior lean;Right lateral lean                                   Pertinent Vitals/Pain Faces Pain Scale: Hurts little more Pain Location: L hip with movement, resolved with repositioning Pain Descriptors / Indicators: Grimacing Pain Intervention(s): Limited activity within patient's tolerance;Monitored during session;Repositioned    Home Living Family/patient expects to be discharged to:: Private residence Living Arrangements: Spouse/significant other Available Help at Discharge: Family;Available 24 hours/day Type of Home: House Home Access: Ramped entrance     Home Layout: Able to live on main level with bedroom/bathroom;Two level Home Equipment: Bedside commode;Cane - single point;Shower seat;Wheelchair - [employer] - 2 wheels;Walker - 4 wheels Additional Comments: may DC to residential hospice    Prior Function Level of Independence: Independent with assistive device(s)         Comments: Pt utilizing cane vs RW for ambulation.     Hand Dominance        Extremity/Trunk Assessment   Upper Extremity Assessment Upper Extremity Assessment: Defer to OT evaluation    Lower Extremity Assessment Lower Extremity Assessment: (B knee extension +2/5)       Communication   Communication: No difficulties  Cognition Arousal/Alertness: Awake/alert Behavior During Therapy: WFL for tasks assessed/performed Overall Cognitive Status: Within Functional Limits for tasks assessed                                        General Comments      Exercises  long arc quads x 5 B AAROM in sitting   Assessment/Plan    PT Assessment Patient needs continued PT services  PT Problem List Decreased strength;Decreased activity tolerance;Decreased  balance;Decreased mobility;Decreased knowledge of use of DME       PT Treatment Interventions Functional mobility training;Therapeutic activities;Therapeutic exercise;Patient/family education;Balance training    PT Goals (Current goals can be found in the Care Plan section)  Acute Rehab PT Goals Patient Stated Goal: to get stronger PT Goal Formulation: With patient/family Time For Goal Achievement: 09/28/18 Potential to Achieve Goals: Good    Frequency Min 2X/week   Barriers to discharge        Co-evaluation               AM-PAC PT "6 Clicks" Daily Activity  Outcome Measure Difficulty turning over in bed (including adjusting bedclothes, sheets and blankets)?: Unable Difficulty moving from lying on back to sitting on the side of the bed? : Unable Difficulty sitting down on and standing up from a chair with arms (e.g., wheelchair, bedside commode, etc,.)?: Unable Help needed moving to and from a bed to chair (including a wheelchair)?: Total Help needed walking in hospital room?: Total Help needed climbing 3-5 steps with a railing? : Total 6 Click Score: 6    End of Session Equipment Utilized During Treatment: Oxygen Activity Tolerance: Patient limited by fatigue Patient left: in bed;with call bell/phone within reach;with family/visitor present Nurse Communication: Mobility status;Need for lift equipment PT Visit Diagnosis: Difficulty in walking, not elsewhere classified (R26.2);Muscle weakness (generalized) (M62.81)    Time: 1856-3149 PT Time Calculation (min) (ACUTE ONLY): 21 min   Charges:   PT Evaluation $PT Eval Moderate Complexity: 1 Mod         Philomena Doheny PT 09/14/2018  Acute Rehabilitation Services Pager [phone] Office [phone]

## 2018-09-14 NOTE — Progress Notes (Signed)
Daily Progress Note   Patient Name: Allen Glenn       Date: 09/14/2018 DOB: Jul 05, 1956  Age: 62 y.o. MRN#: [id number] Attending Physician: Debbe Odea, MD Primary Care Physician: [name], MD Admit Date: 08/28/2018  Reason for Consultation/Follow-up: ongoing goals of care discussions and for assistance with disposition planning, providing psychosocial support  Subjective: Patient is awake alert resting in bed. He is now out of the stepdown unit.  His daughter is at the bedside this am.  He did attempt to take a few bites of cream of wheat He doesn't have much of an appetite He did have a BM earlier today Daughter states that he has been moving his arms and legs more  Wife has gone home to take a shower.    See below.  Length of Stay: 17  Current Medications: Scheduled Meds:  . bisacodyl  10 mg Rectal Once  . chlorhexidine  15 mL Mouth Rinse BID  . Chlorhexidine Gluconate Cloth  6 each Topical Daily  . gabapentin  100 mg Oral TID  . insulin aspart  0-15 Units Subcutaneous TID WC  . insulin glargine  33 Units Subcutaneous QHS  . mouth rinse  15 mL Mouth Rinse q12n4p  . [START ON 09/15/2018] methylPREDNISolone (SOLU-MEDROL) injection  40 mg Intravenous Q24H  . multivitamin  15 mL Oral Daily  . nystatin  5 mL Oral QID  . pantoprazole (PROTONIX) IV  40 mg Intravenous Daily  . patiromer  8.4 g Oral Daily  . polyvinyl alcohol  1 drop Both Eyes TID  . protein supplement shake  11 oz Oral TID BM  . sodium chloride flush  3 mL Intravenous Q12H  . sulfamethoxazole-trimethoprim  2 tablet Oral Q8H    Continuous Infusions: . sodium chloride Stopped (08/30/18 0808)  . sodium chloride 10 mL/hr at 09/14/18 0600  . sodium chloride 75 mL/hr at 09/14/18 0852    PRN Meds: sodium  chloride, sodium chloride, acetaminophen **OR** acetaminophen, chlorpheniramine-HYDROcodone, levalbuterol, LORazepam, magic mouthwash, menthol-cetylpyridinium, metoCLOPramide (REGLAN) injection, morphine injection, nitroGLYCERIN, ondansetron **OR** ondansetron (ZOFRAN) IV, polyethylene glycol, sodium chloride flush, sodium chloride flush, traZODone  Physical Exam         Weak appearing gentleman resting in bed   oxygen nasal cannula Has some skin changes/bruising on his nose He has clear breath sounds  anteriorly S1-S 2 Abdomen is soft non distended Has trace edema Awake alert oriented answers questions appropriately  Vital Signs: BP 116/69 (BP Location: Right Arm)   Pulse (!) 102   Temp 97.8 F (36.6 C) (Oral)   Resp 20   Ht 6\' 2"  (1.88 m)   Wt 77.4 kg   SpO2 95%   BMI 21.91 kg/m  SpO2: SpO2: 95 % O2 Device: O2 Device: Nasal Cannula O2 Flow Rate: O2 Flow Rate (L/min): 3 L/min  Intake/output summary:   Intake/Output Summary (Last 24 hours) at 09/14/2018 1003 Last data filed at 09/14/2018 0900 Gross per 24 hour  Intake 397.8 ml  Output 1701 ml  Net -1303.2 ml   LBM: Last BM Date: 09/13/18 Baseline Weight: Weight: 84.2 kg Most recent weight: Weight: 77.4 kg       Palliative Assessment/Data:    Flowsheet Rows     Most Recent Value  Intake Tab  Referral Department  Hospitalist  Unit at Time of Referral  ICU  Palliative Care Primary Diagnosis  Cancer  Date Notified  09/01/18  Palliative Care Type  Return patient Palliative Care  Reason for referral  Clarify Goals of Care  Date of Admission  08/28/18  Date first seen by Palliative Care  09/01/18  # of days Palliative referral response time  0 Day(s)  # of days IP prior to Palliative referral  4  Clinical Assessment  Psychosocial & Spiritual Assessment  Palliative Care Outcomes      Patient Active Problem List   Diagnosis Date Noted  . Hyponatremia   . Hyperkalemia   . Hypoxia   . Acute respiratory failure  with hypoxia ([place]) 09/01/2018  . Electrolyte imbalance 09/01/2018  . Pneumonia 09/01/2018  . Atherosclerosis of aorta ([place]) 09/01/2018  . Chemotherapy-induced neutropenia ([place])   . Fever 08/28/2018  . Sepsis ([place]) 08/28/2018  . Pancytopenia ([place]) 07/17/2018  . Cancer related pain   . Palliative care by specialist   . DNR (do not resuscitate) discussion 06/11/2018  . Insulin dependent type 2 diabetes mellitus, uncontrolled ([place]) 06/05/2018  . Coronary artery disease 06/05/2018  . Hyperlipidemia 06/05/2018  . 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 Hypertension 06/05/2018  . Anemia 06/05/2018  . Brain metastases ([place]) 06/02/2018  . Elevated alkaline phosphatase level   . Other chronic pancreatitis ([place]) 05/20/2018  . Metastatic colon cancer to liver ([place]) 05/20/2018  . Pulmonary metastasis ([place]) 12/20/2016  . Colon cancer s/p right colectomy  06/13/2016  . Chronic diastolic heart failure ([place]) 06/13/2016  . HTN (hypertension) 05/03/2015  . Symptomatic anemia 04/09/2015  . Iron deficiency anemia 04/09/2015  . CAD s/p PCI LAD 06/2013 [facility] 04/09/2015  . Lumbar radiculopathy 03/07/2015  . Hereditary and idiopathic peripheral neuropathy 03/07/2015    Palliative Care Assessment & Plan   Patient Profile:    Assessment: Acute hypoxic respiratory failure, status post BiPAP use in this hospitalization Life limiting illness of colon cancer status post right colectomy with known metastatic burden to lungs and bones liver and brain Dysphagia  Ongoing supplemental oxygen needs Functional decline Severe deconditioning   Recommendations/Plan:  Continue current mode of care, continue to support the patient and family. As per my discussions with wife, she is hopeful that the patient will be able to do SNF rehab ( palliative to follow over there). how ever, patient has ongoing decline, he is with minimal PO intake and extensive burden from serious illness and may  end up qualifying for residential hospice,  towards the end of this hospitalization. I have discussed this in detail with wife on 09-13-18.   For now, continue current mode of care. PMT to continue to follow.     Code Status:    Code Status Orders  (From admission, onward)         Start     Ordered   09/02/18 0120  Do not attempt resuscitation (DNR)  Continuous    Question Answer Comment  In the event of cardiac or respiratory ARREST Do not call a "code blue"   In the event of cardiac or respiratory ARREST Do not perform Intubation, CPR, defibrillation or ACLS   In the event of cardiac or respiratory ARREST Use medication by any route, position, wound care, and other measures to relive pain and suffering. May use oxygen, suction and manual treatment of airway obstruction as needed for comfort.   Comments would like to continue antibiotics and other medications      09/02/18 0120        Code Status History    Date Active Date Inactive Code Status Order ID Comments User Context   08/28/2018 1346 09/02/2018 0120 Full Code [id number]  Roxan Hockey, MD Inpatient   07/14/2018 1641 07/18/2018 1712 Full Code [id number]  Kerney Elbe, DO Inpatient   06/04/2018 1309 06/06/2018 1353 Full Code [id number]  Erline Levine, MD Inpatient   05/20/2018 0119 05/26/2018 1824 Full Code [id number]  Rise Patience, MD Inpatient   11/28/2016 1451 11/29/2016 1138 Full Code [id number]  Nani Skillern, PA-C Inpatient   04/09/2015 1527 04/10/2015 2320 Full Code [id number]  Nita Sells, MD Inpatient    Advance Directive Documentation     Most Recent Value  Type of Advance Directive  Healthcare Power of Attorney  Pre-existing out of facility DNR order (yellow form or pink MOST form)  -  "MOST" Form in Place?  -      PPS 30% Prognosis:   guarded   Discharge Planning:  To Be Determined  Care plan was discussed with  Patient, daughter   Thank you for allowing the Palliative Medicine Team  to assist in the care of this patient.   Time In: 9 Time Out: 9.25 Total Time 25 Prolonged Time Billed  no       Greater than 50%  of this time was spent counseling and coordinating care related to the above assessment and plan.  Loistine Chance, MD [phone]  Please contact Palliative Medicine Team phone at [phone] for questions and concerns.

## 2018-09-14 NOTE — Progress Notes (Signed)
PT Cancellation Note  Patient Details Name: Allen Glenn MRN: [id number] DOB: April 16, 1956   Cancelled Treatment:    Reason Eval/Treat Not Completed: Other (comment)(pt having a sponge bath with NT, then to get meds from RN, will follow. )  Philomena Doheny PT 09/14/2018  Acute Rehabilitation Services Pager [phone] Office [phone]

## 2018-09-15 DIAGNOSIS — R531 Weakness: Secondary | ICD-10-CM

## 2018-09-15 DIAGNOSIS — R0602 Shortness of breath: Secondary | ICD-10-CM

## 2018-09-15 LAB — BASIC METABOLIC PANEL
ANION GAP: 11 (ref 5–15)
BUN: 14 mg/dL (ref 8–23)
CO2: 22 mmol/L (ref 22–32)
Calcium: 8.1 mg/dL — ABNORMAL LOW (ref 8.9–10.3)
Chloride: 98 mmol/L (ref 98–111)
Creatinine, Ser: 0.32 mg/dL — ABNORMAL LOW (ref 0.61–1.24)
GFR calc Af Amer: >60 mL/min (ref >60)
GFR calc non Af Amer: >60 mL/min (ref >60)
Glucose, Bld: 82 mg/dL (ref 70–99)
POTASSIUM: 4.1 mmol/L (ref 3.5–5.1)
Sodium: 131 mmol/L — ABNORMAL LOW (ref 135–145)

## 2018-09-15 LAB — GLUCOSE, CAPILLARY
GLUCOSE-CAPILLARY: 81 mg/dL (ref 70–99)
GLUCOSE-CAPILLARY: 298 mg/dL — AB (ref 70–99)
Glucose-Capillary: 111 mg/dL — ABNORMAL HIGH (ref 70–99)
Glucose-Capillary: 148 mg/dL — ABNORMAL HIGH (ref 70–99)
Glucose-Capillary: 89 mg/dL (ref 70–99)

## 2018-09-15 MED ORDER — SODIUM CHLORIDE 1 G PO TABS
1.0000 g | ORAL_TABLET | Freq: Three times a day (TID) | ORAL | Status: DC
  Administered 2018-09-15 – 2018-09-22 (×13): 1 g via ORAL
  Filled 2018-09-15 (×25): qty 1

## 2018-09-15 MED ORDER — GABAPENTIN 100 MG PO CAPS: 100.0000 mg | ORAL_CAPSULE | Freq: Every day | ORAL | Status: DC

## 2018-09-15 MED ORDER — PREDNISONE 20 MG PO TABS
60.0000 mg | ORAL_TABLET | Freq: Every day | ORAL | Status: DC
  Administered 2018-09-16: 60 mg via ORAL
  Filled 2018-09-15 (×2): qty 3

## 2018-09-15 NOTE — Progress Notes (Signed)
Patient ID: Allen Glenn, male   DOB: 15-Mar-1956, 62 y.o.   MRN: [id number]  This NP visited patient at the bedside as a follow up for palliative medicine needs and emotional support.  I spoke to wife/Allen Glenn outside the room for 30 minutes prior to visit regarding current medical situation.  Created space and opportunity for her to share her thoughts and feelings regarding struggles of living with terminal disease, and the anticipation of the death of her husband.  She shares many stories speaking to her love and admiration for [name].  She tells me she is emotionally exhausted and wonders "how I am ever going to live without him". Spoke to family foundation and the strength  of memories.  Has many questions regarding the next transitions of care and how it relates to care options.  We discussed skilled nursing facility for short-term rehab and I encouraged her to consider the patient's weakness and fragility in rehab actually being a viable option.  We discussed home with hospice versus residential hospice.  I then spoke to the patient, his wife and his daughter Allen Glenn.  [name] was able to communicate to them his understanding that his time is limited and  the fact that he does not want "to die at home".  Both his daughter and his wife were able to express to him their desire for him to "not suffer" and that they will support him in whatever decisions he makes.  Decision is still outstanding whether he would like to try short-term rehab.  Questions and concerns addressed   Discussed with bedside RN/Jessica  Total time spent on the unit was 60 minutes  I am available by phone and will f/u again in the morning  Greater than 50% of the time was spent in counseling and coordination of care  [name] NP  Palliative Medicine Team Team Phone # [phone] Pager [phone]

## 2018-09-15 NOTE — Progress Notes (Signed)
PROGRESS NOTE    Allen Glenn   NFA:[id number]  DOB: 1956/01/24  DOA: 08/28/2018 PCP: [name], MD   Brief Narrative:  Allen Glenn is a 62 year old male with hypertension, coronary artery disease, diabetes mellitus, metastatic colon cancer on chemotherapy who presented to the hospital for fever of 102 degrees, full oxygen level and fast heart rate.  Noted to be febrile and hypoxic in the ED and was found to have an elevated lactic acid.  He was admitted for sepsis suspected to be due to pneumonia.    Subjective: More alert and communicative today. States food has no taste and he is not hungry.   Assessment & Plan:   Principal Problem:   Acute respiratory failure with hypoxia  - suspected to be bilateral pneumonia progressing to ARDS - the patient was maintained on a BiPAP and did not need intubation -CT chest performed 9/7 shows extensive opacities throughout both lungs. Small bilateral pleural effusion and bilateral lung nodule compatible with metastatic disease. - - ID assisting with management of antibiotics-  Levofloxacin stopped 9/17- Vancomycin stopped on 9/18 - Bactrim to be stopped today - weaning steroids- change to Prednisone tomorrow  - has been weaned down to 3 L O2-  - will continue to wean as able- encourage IS and OOB daily   Active Problems:   Colon cancer s/p right colectomy with metastasis to the lungs, bone, liver and brain - above episode of respiratory failure occurred after the patient received his first chemotherapy -For now chemotherapy has been on hold as respiratory issues are being treated -He is managed by [name] who tells me that he is no longer a candidate for further chemotherapy  Dysphagia - on D 1 diet due to being a high aspiration risk-   advanced to mechanical soft as he is craving more solid food- patient and family understand aspiration risk which I have discussed with them in detail  Hypotension with h/o HTN (hypertension) -  Lisinopril on hold   Insulin dependent type 2 diabetes mellitus, uncontrolled  - cont ISS AC/HS and Lantus - Last A1c 7.9.  - sugars high due to steroids- increased Lantus     Hyponatremia - d/c Lasix on 9/18 as he appeared dehydrated    - has grade 1 d CHF and was not on Lasix at home - sodium has dropped down to 126-127 range   - ? If this is related to Bactrim- he has been receiving about 1.5 L of infusions with Bactrim daily as well - have switched to oral Bactrim -   I suspect Bactrim is causing the increased natriuresis - have IVF to combat this and sodium has improved today   Hyperkalemia - ongoing hyperkalemia- possibly due to Bactrim  - receiving Valtessa daily for now- K 4.9- cont telemetry monitoring    Normocytic anemia - likely AOCD- follow  Thrombocytopenia - noted to have a drop in platelets on lab work on 7/22 and has remained low since- - Platelets have been stable at 60-90 range follow  Severe deconditioning - needs hoyer lift to get out of bed and can barely finish a solid meal  Severe fatigue/ sedated - changed bedtime Ativan to PRN- weaned down Neurontin- much more alert now  Disposition: planning for home with hospice vs hospice home- appreciate palliative care team's assistance  DVT prophylaxis: SCDs Code Status: DNR Family Communication: wife Disposition Plan: follow in hospital for now- he is trying to make a decision regarding SNF vs  [place] [facility] Consultants:   Oncology  ID  Palliative Care Procedures:   none Antimicrobials:  Anti-infectives (From admission, onward)   Start     Dose/Rate Route Frequency Ordered Stop   09/12/18 2200  sulfamethoxazole-trimethoprim (BACTRIM DS,SEPTRA DS) 800-160 MG per tablet 2 tablet     2 tablet Oral Every 8 hours 09/12/18 1224 09/15/18 1247   09/05/18 2200  vancomycin (VANCOCIN) IVPB 1000 mg/200 mL premix  Status:  Discontinued     1,000 mg 200 mL/hr over 60 Minutes Intravenous Every 12 hours 09/05/18  1353 09/10/18 1410   09/01/18 1800  sulfamethoxazole-trimethoprim (BACTRIM) 420 mg in dextrose 5 % 500 mL IVPB  Status:  Discontinued     420 mg 350.8 mL/hr over 90 Minutes Intravenous Every 8 hours 09/01/18 1529 09/12/18 1345   09/01/18 1000  fluconazole (DIFLUCAN) IVPB 400 mg  Status:  Discontinued     400 mg 100 mL/hr over 120 Minutes Intravenous Every 24 hours 09/01/18 0838 09/09/18 1512   08/31/18 2200  vancomycin (VANCOCIN) 1,250 mg in sodium chloride 0.9 % 250 mL IVPB  Status:  Discontinued     1,250 mg 166.7 mL/hr over 90 Minutes Intravenous Every 12 hours 08/31/18 0852 09/05/18 1353   08/31/18 1000  meropenem (MERREM) 1 g in sodium chloride 0.9 % 100 mL IVPB  Status:  Discontinued     1 g 200 mL/hr over 30 Minutes Intravenous Every 8 hours 08/31/18 0848 09/01/18 1546   08/31/18 1000  vancomycin (VANCOCIN) 1,750 mg in sodium chloride 0.9 % 500 mL IVPB     1,750 mg 250 mL/hr over 120 Minutes Intravenous  Once 08/31/18 0852 08/31/18 1146   08/29/18 1400  levofloxacin (LEVAQUIN) IVPB 750 mg     750 mg 100 mL/hr over 90 Minutes Intravenous Every 24 hours 08/29/18 1353 09/11/18 1400   08/28/18 2000  vancomycin (VANCOCIN) 1,250 mg in sodium chloride 0.9 % 250 mL IVPB  Status:  Discontinued     1,250 mg 166.7 mL/hr over 90 Minutes Intravenous Every 12 hours 08/28/18 1725 08/30/18 0853   08/28/18 1500  piperacillin-tazobactam (ZOSYN) IVPB 3.375 g  Status:  Discontinued     3.375 g 12.5 mL/hr over 240 Minutes Intravenous Every 8 hours 08/28/18 1245 08/31/18 0844   08/28/18 0900  vancomycin (VANCOCIN) 1,500 mg in sodium chloride 0.9 % 500 mL IVPB     1,500 mg 250 mL/hr over 120 Minutes Intravenous  Once 08/28/18 0854 08/28/18 1218   08/28/18 0900  piperacillin-tazobactam (ZOSYN) IVPB 3.375 g     3.375 g 100 mL/hr over 30 Minutes Intravenous  Once 08/28/18 0854 08/28/18 0935       Objective: Vitals:   09/14/18 2101 09/15/18 0400 09/15/18 0429 09/15/18 1316  BP: 108/62  (!) 104/55  (!) 118/53  Pulse: (!) 108  (!) 103 (!) 114  Resp: 18  18   Temp: (!) 97.5 F (36.4 C)  98 F (36.7 C) 98 F (36.7 C)  TempSrc: Oral  Oral Oral  SpO2: 97%  96% 93%  Weight:  76 kg    Height:        Intake/Output Summary (Last 24 hours) at 09/15/2018 1354 Last data filed at 09/15/2018 1351 Gross per 24 hour  Intake 2208.42 ml  Output 1450 ml  Net 758.42 ml   Filed Weights   09/13/18 0346 09/14/18 0554 09/15/18 0400  Weight: 77.3 kg 77.4 kg 76 kg    Examination:  General exam: Appears comfortable  HEENT: PERRLA, oral mucosa  moist, no sclera icterus or thrush- thrush improving Respiratory system: Clear to auscultation. Respiratory effort normal. Cardiovascular system: S1 & S2 heard,  No murmurs  Gastrointestinal system: Abdomen soft, non-tender, nondistended. Normal bowel sound. No organomegaly Central nervous system: Alert and oriented. No focal neurological deficits. Extremities: No cyanosis, clubbing or edema Skin: No rashes or ulcers Psychiatry:  Mood & affect appropriate.    Data Reviewed: I have personally reviewed following labs and imaging studies  CBC: Recent Labs  Lab 09/09/18 0330 09/10/18 0529 09/11/18 1025 09/13/18 0400  WBC 11.8* 14.1* 12.4* 15.5*  NEUTROABS 11.4* 13.5*  --   --   HGB 9.0* 9.3* 9.5* 9.4*  HCT 27.1* 28.3* 28.9* 28.2*  MCV 92.8 93.1 93.8 93.1  PLT 72* 87* 87* 91*   Basic Metabolic Panel: Recent Labs  Lab 09/09/18 0330 09/10/18 0529  09/12/18 0530 09/13/18 0400 09/14/18 0447 09/14/18 1627 09/15/18 0320  NA 128* 129*   < > 127* 126* 127* 129* 131*  K 4.2 5.0   < > 5.3* 4.6 4.9 4.4 4.1  CL 93* 94*   < > 91* 91* 92* 96* 98  CO2 23 23   < > 22 23 22 22 22   GLUCOSE 160* 154*   < > 156* 108* 160* 136* 82  BUN 23 24*   < > 18 19 16 15 14   CREATININE 0.42* 0.42*   < > 0.43* 0.41* 0.39* 0.37* 0.32*  CALCIUM 8.1* 8.4*   < > 8.7* 8.5* 8.4* 8.3* 8.1*  MG 2.6* 2.7*  --   --   --   --   --   --   PHOS 3.7 3.4  --   --   --   --   --    --    < > = values in this interval not displayed.   GFR: Estimated Creatinine Clearance: 104.2 mL/min (A) (by C-G formula based on SCr of 0.32 mg/dL (L)). Liver Function Tests: Recent Labs  Lab 09/09/18 0330 09/10/18 0529  AST 28 28  ALT 25 25  ALKPHOS 187* 177*  BILITOT 0.2* 0.4  PROT 4.9* 5.1*  ALBUMIN 2.1* 2.2*   No results for input(s): LIPASE, AMYLASE in the last 168 hours. No results for input(s): AMMONIA in the last 168 hours. Coagulation Profile: No results for input(s): INR, PROTIME in the last 168 hours. Cardiac Enzymes: No results for input(s): CKTOTAL, CKMB, CKMBINDEX, TROPONINI in the last 168 hours. BNP (last 3 results) No results for input(s): PROBNP in the last 8760 hours. HbA1C: No results for input(s): HGBA1C in the last 72 hours. CBG: Recent Labs  Lab 09/14/18 1636 09/14/18 2105 09/15/18 0021 09/15/18 0426 09/15/18 1156  GLUCAP 135* 131* 89 81 298*   Lipid Profile: No results for input(s): CHOL, HDL, LDLCALC, TRIG, CHOLHDL, LDLDIRECT in the last 72 hours. Thyroid Function Tests: No results for input(s): TSH, T4TOTAL, FREET4, T3FREE, THYROIDAB in the last 72 hours. Anemia Panel: No results for input(s): VITAMINB12, FOLATE, FERRITIN, TIBC, IRON, RETICCTPCT in the last 72 hours. Urine analysis:    Component Value Date/Time   COLORURINE YELLOW 08/28/2018 1051   APPEARANCEUR CLEAR 08/28/2018 1051   LABSPEC 1.015 08/28/2018 1051   PHURINE 6.0 08/28/2018 1051   GLUCOSEU 150 (A) 08/28/2018 1051   HGBUR NEGATIVE 08/28/2018 1051   BILIRUBINUR NEGATIVE 08/28/2018 1051   KETONESUR NEGATIVE 08/28/2018 1051   PROTEINUR NEGATIVE 08/28/2018 1051   NITRITE NEGATIVE 08/28/2018 1051   LEUKOCYTESUR NEGATIVE 08/28/2018 1051   Sepsis Labs: @  LABRCNTIP(procalcitonin:4,lacticidven:4) ) No results found for this or any previous visit (from the past 240 hour(s)).       Radiology Studies: No results found.    Scheduled Meds: . bisacodyl  10 mg Rectal  Once  . chlorhexidine  15 mL Mouth Rinse BID  . Chlorhexidine Gluconate Cloth  6 each Topical Daily  . gabapentin  100 mg Oral TID  . insulin aspart  0-15 Units Subcutaneous TID WC  . insulin glargine  33 Units Subcutaneous QHS  . mouth rinse  15 mL Mouth Rinse q12n4p  . multivitamin  15 mL Oral Daily  . nystatin  5 mL Oral QID  . pantoprazole (PROTONIX) IV  40 mg Intravenous Daily  . patiromer  8.4 g Oral Daily  . polyvinyl alcohol  1 drop Both Eyes TID  . [START ON 09/16/2018] predniSONE  60 mg Oral Q breakfast  . protein supplement shake  11 oz Oral TID BM  . sodium chloride flush  3 mL Intravenous Q12H  . sodium chloride  1 g Oral TID WC   Continuous Infusions: . sodium chloride Stopped (08/30/18 0808)  . sodium chloride 10 mL/hr at 09/14/18 0600  . sodium chloride 75 mL/hr at 09/15/18 1024     LOS: 18 days    Time spent in minutes: 30    Debbe Odea, MD [facility] Pager: [email or web address] Password [facility] 09/15/2018, 1:54 PM

## 2018-09-15 NOTE — Progress Notes (Signed)
Patient ID: Allen Glenn, male   DOB: Sep 20, 1956, 62 y.o.   MRN: [id number]          [facility]    Date of Admission:  08/28/2018    Total days of antibiotics 19        Day 14 trimethoprim sulfamethoxazole  He is feeling a little bit stronger today and eating a little bit more.  He is currently sitting up in bed visiting his wife and children.  He has improved on broad empiric therapy for presumed HCAP.  I will stop trimethoprim sulfamethoxazole as planned today.  I will sign off now.         Michel Bickers, MD [facility] [phone] pager   [phone] cell 09/15/2018, 4:12 PM

## 2018-09-16 ENCOUNTER — Inpatient Hospital Stay: Payer: 59

## 2018-09-16 ENCOUNTER — Inpatient Hospital Stay: Payer: 59 | Admitting: Nurse Practitioner

## 2018-09-16 DIAGNOSIS — E43 Unspecified severe protein-calorie malnutrition: Secondary | ICD-10-CM

## 2018-09-16 LAB — BASIC METABOLIC PANEL
ANION GAP: 10 (ref 5–15)
BUN: 11 mg/dL (ref 8–23)
CALCIUM: 7.8 mg/dL — AB (ref 8.9–10.3)
CO2: 22 mmol/L (ref 22–32)
Chloride: 103 mmol/L (ref 98–111)
Creatinine, Ser: <0.3 mg/dL — ABNORMAL LOW (ref 0.61–1.24)
GLUCOSE: 80 mg/dL (ref 70–99)
POTASSIUM: 3.6 mmol/L (ref 3.5–5.1)
Sodium: 135 mmol/L (ref 135–145)

## 2018-09-16 LAB — GLUCOSE, CAPILLARY
GLUCOSE-CAPILLARY: 101 mg/dL — AB (ref 70–99)
GLUCOSE-CAPILLARY: 151 mg/dL — AB (ref 70–99)
GLUCOSE-CAPILLARY: 232 mg/dL — AB (ref 70–99)
GLUCOSE-CAPILLARY: 77 mg/dL (ref 70–99)
Glucose-Capillary: 196 mg/dL — ABNORMAL HIGH (ref 70–99)
Glucose-Capillary: 215 mg/dL — ABNORMAL HIGH (ref 70–99)
Glucose-Capillary: 83 mg/dL (ref 70–99)
Glucose-Capillary: 90 mg/dL (ref 70–99)

## 2018-09-16 MED ORDER — PREDNISONE 50 MG PO TABS
50.0000 mg | ORAL_TABLET | Freq: Every day | ORAL | Status: DC
  Administered 2018-09-17 – 2018-09-22 (×6): 50 mg via ORAL
  Filled 2018-09-16 (×6): qty 1

## 2018-09-16 MED ORDER — PANTOPRAZOLE SODIUM 40 MG PO TBEC
40.0000 mg | DELAYED_RELEASE_TABLET | Freq: Every day | ORAL | Status: DC
  Administered 2018-09-17 – 2018-09-22 (×6): 40 mg via ORAL
  Filled 2018-09-16 (×6): qty 1

## 2018-09-16 MED ORDER — GABAPENTIN 300 MG PO CAPS
300.0000 mg | ORAL_CAPSULE | Freq: Every day | ORAL | Status: DC
  Administered 2018-09-16 – 2018-09-21 (×6): 300 mg via ORAL
  Filled 2018-09-16 (×6): qty 1

## 2018-09-16 MED ORDER — INSULIN GLARGINE 100 UNIT/ML ~~LOC~~ SOLN
28.0000 [IU] | Freq: Every day | SUBCUTANEOUS | Status: DC
  Administered 2018-09-16 – 2018-09-20 (×5): 28 [IU] via SUBCUTANEOUS
  Filled 2018-09-16 (×6): qty 0.28

## 2018-09-16 MED ORDER — GABAPENTIN 100 MG PO CAPS
100.0000 mg | ORAL_CAPSULE | Freq: Two times a day (BID) | ORAL | Status: DC
  Administered 2018-09-16 – 2018-09-22 (×13): 100 mg via ORAL
  Filled 2018-09-16 (×13): qty 1

## 2018-09-16 MED ORDER — CALCIUM CARBONATE ANTACID 500 MG PO CHEW
400.0000 mg | CHEWABLE_TABLET | Freq: Three times a day (TID) | ORAL | Status: DC | PRN
  Administered 2018-09-16: 400 mg via ORAL
  Filled 2018-09-16 (×2): qty 2

## 2018-09-16 MED ORDER — MORPHINE SULFATE 15 MG PO TABS
15.0000 mg | ORAL_TABLET | ORAL | Status: DC | PRN
  Administered 2018-09-16: 15 mg via ORAL
  Filled 2018-09-16: qty 1

## 2018-09-16 NOTE — Care Management Note (Signed)
Case Management Note  Patient Details  Name: Allen Glenn MRN: [id number] Date of Birth: 08-22-1956  Subjective/Objective:d/c SNF-CSW managing.                    Action/Plan:dc SNF.   Expected Discharge Date:  (unknown)               Expected Discharge Plan:  [place]  In-House Referral:     Discharge planning Services  CM Consult  Post Acute Care Choice:    Choice offered to:     DME Arranged:    DME Agency:     HH Arranged:    [place] Agency:     Status of Service:  Completed, signed off  If discussed at [employer] of Stay Meetings, dates discussed:    Additional Comments:  Dessa Phi, RN 09/16/2018, 11:52 AM

## 2018-09-16 NOTE — NC FL2 (Signed)
[place] MEDICAID FL2 LEVEL OF CARE SCREENING TOOL     IDENTIFICATION  Patient Name: Allen Glenn Birthdate: [birth year] Sex: male Admission Date (Current Location): 08/28/2018  [facility] and [place] Number:  Herbalist and Address:  [facility],  [place] [address], [place]      Provider Number: [id number]  Attending Physician Name and Address:  Debbe Odea, MD  Relative Name and Phone Number:       Current Level of Care: Hospital Recommended Level of Care: [place] Prior Approval Number:    Date Approved/Denied:   PASRR Number: [id number]  Discharge Plan: SNF    Current Diagnoses: Patient Active Problem List   Diagnosis Date Noted  . SOB (shortness of breath)   . Hyponatremia   . Hyperkalemia   . Hypoxia   . Acute respiratory failure with hypoxia ([place]) 09/01/2018  . Electrolyte imbalance 09/01/2018  . Pneumonia 09/01/2018  . Atherosclerosis of aorta ([place]) 09/01/2018  . Chemotherapy-induced neutropenia ([place])   . Fever 08/28/2018  . Sepsis ([place]) 08/28/2018  . Pancytopenia ([place]) 07/17/2018  . Weakness generalized 07/14/2018  . Cancer related pain   . Palliative care by specialist   . DNR (do not resuscitate) discussion 06/11/2018  . Insulin dependent type 2 diabetes mellitus, uncontrolled ([place]) 06/05/2018  . Coronary artery disease 06/05/2018  . Hyperlipidemia 06/05/2018  . 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[place]) 06/05/2018  . Hypertension 06/05/2018  . Anemia 06/05/2018  . Brain metastases ([place]) 06/02/2018  . Elevated alkaline phosphatase level   . Other chronic pancreatitis ([place]) 05/20/2018  . Metastatic colon cancer to liver ([place]) 05/20/2018  . Pulmonary metastasis ([place]) 12/20/2016  . Colon cancer s/p right colectomy  06/13/2016  . Chronic diastolic heart failure ([place]) 06/13/2016  . HTN (hypertension) 05/03/2015  . Symptomatic anemia 04/09/2015  . Iron deficiency anemia 04/09/2015  . CAD s/p PCI LAD  06/2013 [facility] 04/09/2015  . Lumbar radiculopathy 03/07/2015  . Hereditary and idiopathic peripheral neuropathy 03/07/2015    Orientation RESPIRATION BLADDER Height & Weight     Self, Time, Situation, Place  O2 Incontinent Weight: 166 lb 7.2 oz (75.5 kg) Height:  6\' 2"  (188 cm)  BEHAVIORAL SYMPTOMS/MOOD NEUROLOGICAL BOWEL NUTRITION STATUS      Continent Diet(DYS 2)  AMBULATORY STATUS COMMUNICATION OF NEEDS Skin   Total Care Verbally Normal                       Personal Care Assistance Level of Assistance  Bathing, Feeding, Dressing Bathing Assistance: Maximum assistance Feeding assistance: Maximum assistance Dressing Assistance: Maximum assistance     Functional Limitations Info  Sight, Hearing, Speech Sight Info: Adequate Hearing Info: Adequate Speech Info: Adequate    SPECIAL CARE FACTORS FREQUENCY  PT (By licensed PT), OT (By licensed OT)     PT Frequency: 5x/week OT Frequency: 5x/week            Contractures      Additional Factors Info  Code Status, Allergies Code Status Info: DNR Allergies Info: NKA           Current Medications (09/16/2018):  This is the current hospital active medication list Current Facility-Administered Medications  Medication Dose Route Frequency Provider Last Rate Last Dose  . 0.9 %  sodium chloride infusion  250 mL Intravenous PRN Roxan Hockey, MD   Stopped at 08/30/18 0093  . 0.9 %  sodium chloride infusion   Intravenous PRN Brunetta Genera, MD  10 mL/hr at 09/14/18 0600    . 0.9 %  sodium chloride infusion   Intravenous Continuous Debbe Odea, MD 75 mL/hr at 09/15/18 2358    . acetaminophen (TYLENOL) tablet 650 mg  650 mg Oral Q6H PRN Emokpae, Courage, MD   650 mg at 09/16/18 1018   Or  . acetaminophen (TYLENOL) suppository 650 mg  650 mg Rectal Q6H PRN [place] Brick, Courage, MD   650 mg at 09/02/18 0410  . bisacodyl (DULCOLAX) suppository 10 mg  10 mg Rectal Once Alfredia Ferguson, Omair Latif, DO      . chlorhexidine  (PERIDEX) 0.12 % solution 15 mL  15 mL Mouth Rinse BID Raiford Noble [place], DO   15 mL at 09/16/18 1884  . Chlorhexidine Gluconate Cloth 2 % PADS 6 each  6 each Topical Daily Roxan Hockey, MD   6 each at 09/14/18 1045  . chlorpheniramine-HYDROcodone (TUSSIONEX) 10-8 MG/5ML suspension 5 mL  5 mL Oral Q12H PRN Debbe Odea, MD   5 mL at 09/13/18 1409  . gabapentin (NEURONTIN) capsule 100 mg  100 mg Oral TID Debbe Odea, MD   100 mg at 09/16/18 0959  . insulin aspart (novoLOG) injection 0-15 Units  0-15 Units Subcutaneous TID WC Debbe Odea, MD   2 Units at 09/15/18 1712  . insulin glargine (LANTUS) injection 28 Units  28 Units Subcutaneous QHS Rizwan, Saima, MD      . levalbuterol (XOPENEX) nebulizer solution 1.25 mg  1.25 mg Nebulization Q4H PRN Patrecia Pour, Christean Grief, MD   1.25 mg at 09/12/18 1755  . LORazepam (ATIVAN) injection 0.5 mg  0.5 mg Intravenous BID BM & HS PRN Debbe Odea, MD   0.5 mg at 09/16/18 0119  . magic mouthwash  5 mL Oral QID PRN Roxan Hockey, MD   5 mL at 09/12/18 1058  . MEDLINE mouth rinse  15 mL Mouth Rinse [address] [place], DO   15 mL at 09/15/18 1247  . menthol-cetylpyridinium (CEPACOL) lozenge 3 mg  1 lozenge Oral PRN Debbe Odea, MD      . metoCLOPramide (REGLAN) injection 10 mg  10 mg Intravenous Q12H PRN Sheikh, Omair Latif, DO      . morphine (MSIR) tablet 15 mg  15 mg Oral Q4H PRN Knox Royalty, NP      . multivitamin liquid 15 mL  15 mL Oral Daily Sheikh, Omair Latif, DO   15 mL at 09/16/18 0956  . nitroGLYCERIN (NITROSTAT) SL tablet 0.4 mg  0.4 mg Sublingual Q5 min PRN Emokpae, Courage, MD      . nystatin (MYCOSTATIN) 100000 UNIT/ML suspension 500,000 Units  5 mL Oral QID Debbe Odea, MD   500,000 Units at 09/16/18 0958  . ondansetron (ZOFRAN) tablet 4 mg  4 mg Oral Q6H PRN Emokpae, Courage, MD       Or  . ondansetron (ZOFRAN) injection 4 mg  4 mg Intravenous Q6H PRN Emokpae, Courage, MD      . pantoprazole (PROTONIX) injection 40 mg  40  mg Intravenous Daily Debbe Odea, MD   40 mg at 09/16/18 0957  . polyethylene glycol (MIRALAX / GLYCOLAX) packet 17 g  17 g Oral Daily PRN Roxan Hockey, MD   17 g at 09/15/18 0918  . polyvinyl alcohol (LIQUIFILM TEARS) 1.4 % ophthalmic solution 1 drop  1 drop Both Eyes TID Patrecia Pour, Christean Grief, MD   1 drop at 09/09/18 1019  . predniSONE (DELTASONE) tablet 60 mg  60 mg Oral Q breakfast Debbe Odea, MD  60 mg at 09/16/18 0955  . protein supplement (PREMIER PROTEIN) liquid - approved for s/p bariatric surgery  11 oz Oral TID BM Patrecia Pour, Christean Grief, MD   11 oz at 09/15/18 2049  . sodium chloride flush (NS) 0.9 % injection 10-40 mL  10-40 mL Intracatheter PRN Roxan Hockey, MD   10 mL at 09/16/18 0624  . sodium chloride flush (NS) 0.9 % injection 3 mL  3 mL Intravenous Q12H Emokpae, Courage, MD   3 mL at 09/15/18 0919  . sodium chloride flush (NS) 0.9 % injection 3 mL  3 mL Intravenous PRN Emokpae, Courage, MD      . sodium chloride tablet 1 g  1 g Oral TID WC Rizwan, Saima, MD   1 g at 09/16/18 1000  . traZODone (DESYREL) tablet 50 mg  50 mg Oral QHS PRN Roxan Hockey, MD   50 mg at 09/15/18 2046     Discharge Medications: Please see discharge summary for a list of discharge medications.  Relevant Imaging Results:  Relevant Lab Results:   Additional Information SSN [id number]  Burnis Medin, LCSW

## 2018-09-16 NOTE — Progress Notes (Signed)
PROGRESS NOTE    Allen Glenn   CLE:[id number]  DOB: October 16, 1956  DOA: 08/28/2018 PCP: [name], MD   Brief Narrative:  Allen Glenn is a 62 year old male with hypertension, coronary artery disease, diabetes mellitus, metastatic colon cancer on chemotherapy who presented to the hospital for fever of 102 degrees, full oxygen level and fast heart rate.  Noted to be febrile and hypoxic in the ED and was found to have an elevated lactic acid.  He was admitted for sepsis suspected to be due to pneumonia.    Subjective: He has no complaints today.  Assessment & Plan:   Principal Problem:   Acute respiratory failure with hypoxia  - suspected to be bilateral pneumonia progressing to ARDS - the patient was maintained on a BiPAP and did not need intubation -CT chest performed 9/7 shows extensive opacities throughout both lungs. Small bilateral pleural effusion and bilateral lung nodule compatible with metastatic disease. - - ID assisting with management of antibiotics-  Levofloxacin stopped 9/17- Vancomycin stopped on 9/18 - Bactrim stopped 9/23  - has been weaned down to 3 L O2- - will continue to wean as able- encourage IS and OOB daily - have weaned Prednisone to 60 mg- recommend weaning by 10 mg daily until completed  Active Problems:   Colon cancer s/p right colectomy with metastasis to the lungs, bone, liver and brain - above episode of respiratory failure occurred after the patient received his first chemotherapy -For now chemotherapy has been on hold as respiratory issues are being treated -He is managed by [name] who tells me that he is no longer a candidate for further chemotherapy  Dysphagia - on D 1 diet due to being a high aspiration risk-   advanced to mechanical soft as he is craving more solid food- patient and family understand aspiration risk which I have discussed with them in detail  Hypotension with h/o HTN (hypertension) - Lisinopril on hold   Insulin  dependent type 2 diabetes mellitus, uncontrolled  - cont ISS AC/HS and Lantus - Last A1c 7.9.  - sugars high due to steroids but as steroids are being weaned, I am also tapering Lantus- will drop dose from 35 to 28 U today as AM glucose was 90. Will need to taper Lantus at SNF as steroids tapered   Hyponatremia - d/c Lasix on 9/18 as he appeared dehydrated    - has grade 1 d CHF and was not on Lasix at home - sodium has dropped down to 126-127 range   - ? If this is related to Bactrim- he has been receiving about 1.5 L of infusions with Bactrim daily as well - have switched to oral Bactrim -   I suspect Bactrim is causing the increased natriuresis - have IVF to combat this and sodium has improved - can d/c IVF tomorrow   Hyperkalemia - ongoing hyperkalemia- possibly due to Bactrim  -he was  receiving Valtessa daily - K 3.6 today and Bactrim stopped yesterday- can d/c Valtessa and telemetry monitoring    Normocytic anemia - likely AOCD- follow  Thrombocytopenia - noted to have a drop in platelets on lab work on 7/22 and has remained low since- - Platelets have been stable at 60-90 range follow  Severe deconditioning - needs 2 + assistance to get out of bed and can barely finish a solid meal  Severe fatigue/ sedated - changed bedtime Ativan to PRN- weaned down Neurontin- much more alert now  Disposition: planning for  home with hospice vs hospice home- appreciate palliative care team's assistance  DVT prophylaxis: SCDs Code Status: DNR Family Communication: wife Disposition Plan:  He will d/c to SNF tomorrow Consultants:   Oncology  ID  Palliative Care Procedures:   none Antimicrobials:  Anti-infectives (From admission, onward)   Start     Dose/Rate Route Frequency Ordered Stop   09/12/18 2200  sulfamethoxazole-trimethoprim (BACTRIM DS,SEPTRA DS) 800-160 MG per tablet 2 tablet     2 tablet Oral Every 8 hours 09/12/18 1224 09/15/18 1247   09/05/18 2200  vancomycin  (VANCOCIN) IVPB 1000 mg/200 mL premix  Status:  Discontinued     1,000 mg 200 mL/hr over 60 Minutes Intravenous Every 12 hours 09/05/18 1353 09/10/18 1410   09/01/18 1800  sulfamethoxazole-trimethoprim (BACTRIM) 420 mg in dextrose 5 % 500 mL IVPB  Status:  Discontinued     420 mg 350.8 mL/hr over 90 Minutes Intravenous Every 8 hours 09/01/18 1529 09/12/18 1345   09/01/18 1000  fluconazole (DIFLUCAN) IVPB 400 mg  Status:  Discontinued     400 mg 100 mL/hr over 120 Minutes Intravenous Every 24 hours 09/01/18 0838 09/09/18 1512   08/31/18 2200  vancomycin (VANCOCIN) 1,250 mg in sodium chloride 0.9 % 250 mL IVPB  Status:  Discontinued     1,250 mg 166.7 mL/hr over 90 Minutes Intravenous Every 12 hours 08/31/18 0852 09/05/18 1353   08/31/18 1000  meropenem (MERREM) 1 g in sodium chloride 0.9 % 100 mL IVPB  Status:  Discontinued     1 g 200 mL/hr over 30 Minutes Intravenous Every 8 hours 08/31/18 0848 09/01/18 1546   08/31/18 1000  vancomycin (VANCOCIN) 1,750 mg in sodium chloride 0.9 % 500 mL IVPB     1,750 mg 250 mL/hr over 120 Minutes Intravenous  Once 08/31/18 0852 08/31/18 1146   08/29/18 1400  levofloxacin (LEVAQUIN) IVPB 750 mg     750 mg 100 mL/hr over 90 Minutes Intravenous Every 24 hours 08/29/18 1353 09/11/18 1400   08/28/18 2000  vancomycin (VANCOCIN) 1,250 mg in sodium chloride 0.9 % 250 mL IVPB  Status:  Discontinued     1,250 mg 166.7 mL/hr over 90 Minutes Intravenous Every 12 hours 08/28/18 1725 08/30/18 0853   08/28/18 1500  piperacillin-tazobactam (ZOSYN) IVPB 3.375 g  Status:  Discontinued     3.375 g 12.5 mL/hr over 240 Minutes Intravenous Every 8 hours 08/28/18 1245 08/31/18 0844   08/28/18 0900  vancomycin (VANCOCIN) 1,500 mg in sodium chloride 0.9 % 500 mL IVPB     1,500 mg 250 mL/hr over 120 Minutes Intravenous  Once 08/28/18 0854 08/28/18 1218   08/28/18 0900  piperacillin-tazobactam (ZOSYN) IVPB 3.375 g     3.375 g 100 mL/hr over 30 Minutes Intravenous  Once  08/28/18 0854 08/28/18 0935       Objective: Vitals:   09/15/18 2027 09/16/18 0403 09/16/18 0409 09/16/18 1258  BP: 120/70  (!) 102/59 (!) 106/53  Pulse: (!) 106  99 (!) 109  Resp: 20  20   Temp: 97.7 F (36.5 C)  98.4 F (36.9 C) 97.6 F (36.4 C)  TempSrc: Oral  Oral Oral  SpO2: 96%  96% 92%  Weight:  75.5 kg    Height:        Intake/Output Summary (Last 24 hours) at 09/16/2018 1434 Last data filed at 09/16/2018 0955 Gross per 24 hour  Intake 707.37 ml  Output 1050 ml  Net -342.63 ml   Filed Weights   09/14/18 0554  09/15/18 0400 09/16/18 0403  Weight: 77.4 kg 76 kg 75.5 kg    Examination:  General exam: Appears comfortable  HEENT: PERRLA, oral mucosa moist, no sclera icterus or thrush- thrush improving Respiratory system: Clear to auscultation. Respiratory effort normal. Cardiovascular system: S1 & S2 heard,  No murmurs  Gastrointestinal system: Abdomen soft, non-tender, nondistended. Normal bowel sound. No organomegaly Central nervous system: Alert and oriented. No focal neurological deficits. Extremities: No cyanosis, clubbing or edema Skin: No rashes or ulcers Psychiatry:  Mood & affect appropriate.    Data Reviewed: I have personally reviewed following labs and imaging studies  CBC: Recent Labs  Lab 09/10/18 0529 09/11/18 1025 09/13/18 0400  WBC 14.1* 12.4* 15.5*  NEUTROABS 13.5*  --   --   HGB 9.3* 9.5* 9.4*  HCT 28.3* 28.9* 28.2*  MCV 93.1 93.8 93.1  PLT 87* 87* 91*   Basic Metabolic Panel: Recent Labs  Lab 09/10/18 0529  09/13/18 0400 09/14/18 0447 09/14/18 1627 09/15/18 0320 09/16/18 0623  NA 129*   < > 126* 127* 129* 131* 135  K 5.0   < > 4.6 4.9 4.4 4.1 3.6  CL 94*   < > 91* 92* 96* 98 103  CO2 23   < > 23 22 22 22 22   GLUCOSE 154*   < > 108* 160* 136* 82 80  BUN 24*   < > 19 16 15 14 11   CREATININE 0.42*   < > 0.41* 0.39* 0.37* 0.32* <0.30*  CALCIUM 8.4*   < > 8.5* 8.4* 8.3* 8.1* 7.8*  MG 2.7*  --   --   --   --   --   --   PHOS  3.4  --   --   --   --   --   --    < > = values in this interval not displayed.   GFR: CrCl cannot be calculated (This lab value cannot be used to calculate CrCl because it is not a number: <0.30). Liver Function Tests: Recent Labs  Lab 09/10/18 0529  AST 28  ALT 25  ALKPHOS 177*  BILITOT 0.4  PROT 5.1*  ALBUMIN 2.2*   No results for input(s): LIPASE, AMYLASE in the last 168 hours. No results for input(s): AMMONIA in the last 168 hours. Coagulation Profile: No results for input(s): INR, PROTIME in the last 168 hours. Cardiac Enzymes: No results for input(s): CKTOTAL, CKMB, CKMBINDEX, TROPONINI in the last 168 hours. BNP (last 3 results) No results for input(s): PROBNP in the last 8760 hours. HbA1C: No results for input(s): HGBA1C in the last 72 hours. CBG: Recent Labs  Lab 09/16/18 0002 09/16/18 0412 09/16/18 0620 09/16/18 0742 09/16/18 1134  GLUCAP 101* 83 77 90 151*   Lipid Profile: No results for input(s): CHOL, HDL, LDLCALC, TRIG, CHOLHDL, LDLDIRECT in the last 72 hours. Thyroid Function Tests: No results for input(s): TSH, T4TOTAL, FREET4, T3FREE, THYROIDAB in the last 72 hours. Anemia Panel: No results for input(s): VITAMINB12, FOLATE, FERRITIN, TIBC, IRON, RETICCTPCT in the last 72 hours. Urine analysis:    Component Value Date/Time   COLORURINE YELLOW 08/28/2018 1051   APPEARANCEUR CLEAR 08/28/2018 1051   LABSPEC 1.015 08/28/2018 1051   PHURINE 6.0 08/28/2018 1051   GLUCOSEU 150 (A) 08/28/2018 1051   HGBUR NEGATIVE 08/28/2018 1051   BILIRUBINUR NEGATIVE 08/28/2018 [place] 08/28/2018 1051   PROTEINUR NEGATIVE 08/28/2018 1051   NITRITE NEGATIVE 08/28/2018 1051   LEUKOCYTESUR NEGATIVE 08/28/2018 1051  Sepsis Labs: @LABRCNTIP (procalcitonin:4,lacticidven:4) ) No results found for this or any previous visit (from the past 240 hour(s)).       Radiology Studies: No results found.    Scheduled Meds: . bisacodyl  10 mg Rectal  Once  . chlorhexidine  15 mL Mouth Rinse BID  . Chlorhexidine Gluconate Cloth  6 each Topical Daily  . gabapentin  100 mg Oral TID  . insulin aspart  0-15 Units Subcutaneous TID WC  . insulin glargine  28 Units Subcutaneous QHS  . mouth rinse  15 mL Mouth Rinse q12n4p  . multivitamin  15 mL Oral Daily  . nystatin  5 mL Oral QID  . [START ON 09/17/2018] pantoprazole  40 mg Oral Daily  . polyvinyl alcohol  1 drop Both Eyes TID  . predniSONE  60 mg Oral Q breakfast  . protein supplement shake  11 oz Oral TID BM  . sodium chloride flush  3 mL Intravenous Q12H  . sodium chloride  1 g Oral TID WC   Continuous Infusions: . sodium chloride Stopped (08/30/18 0808)  . sodium chloride 10 mL/hr at 09/14/18 0600  . sodium chloride 75 mL/hr at 09/16/18 1220     LOS: 19 days    Time spent in minutes: 30    Debbe Odea, MD [facility] Pager: [email or web address] Password [facility] 09/16/2018, 2:34 PM
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
## 2018-09-16 NOTE — Progress Notes (Signed)
Nutrition Follow-up  DOCUMENTATION CODES:   Severe malnutrition in context of acute illness/injury, Severe malnutrition in context of chronic illness  INTERVENTION:  - Continue Magic Cup TID and Premier Protein BID.  - Further diet liberalization per MD. - Will continue to monitor for patient and family needs and assist as able.    NUTRITION DIAGNOSIS:   Severe Malnutrition related to acute illness, chronic illness, catabolic illness, cancer and cancer related treatments as evidenced by energy intake < or equal to 50% for > or equal to 5 days, percent weight loss -revised  GOAL:   Patient will meet greater than or equal to 90% of their needs -unmet  MONITOR:   PO intake, Supplement acceptance, Weight trends, Labs  ASSESSMENT:   62 year-old with PMH of stage 4 colon cancer with liver mets s/p R colectomy and last chemo on 08/19/18, HTN, and Type 2 DM. Patient admitted with dx of sepsis, pancytopenia, and neutropenia with fever.  Weight trending significantly down (-20 lb/11% body weight) in the past 3 weeks. Patient has been eating very poorly throughout admission so nutrition dx now updated to reflect patient meets criteria for severe malnutrition. Diet was advanced from Dysphagia 1, thin liquids to Dysphagia 2, thin liquids yesterday evening. Per Dr. Reggy Eye note yesterday, she had extensively talked with patient and family about high risk for aspiration and patient and family are aware of and accepting of aspiration risk.   There has been some issue with his meals being "on hold' and patient being declined foods which are appropriate for current diet order. Will communicate with the kitchen concerning these issues. Placed lunch order per patient request/preferences: cheese quesadilla, spaghetti with meat sauce, dinner roll, pinto beans, a brownie, and a Coke. Many of these items not permitted on current diet order but placed override. Communicated with Dr. Wynelle Cleveland about above listed  items and requested further diet liberalization, at her discretion. Per her note, patient deciding between SNF vs [facility].   Medications reviewed; sliding scale Novolog, 28 units Lantus/day, 15 mL liquid multivitamin/day, 5 mL Mycostatin QID, 40 mg oral Protonix/day, 60 mg Deltasone/day, 1 g sodium chloride TID. Labs reviewed; CBGs: 83, 77, and 90 mg/dL today, creatinine: <0.3 mg/dL, Ca: 7.8 mg/dL. IVF; NS @ 75 mL/hr.       Diet Order:   Diet Order            DIET DYS 2 Room service appropriate? Yes; Fluid consistency: Thin  Diet effective now              EDUCATION NEEDS:   No education needs have been identified at this time  Skin:  Skin Assessment: Reviewed RN Assessment  Last BM:  9/23  Height:   Ht Readings from Last 1 Encounters:  08/28/18 6\' 2"  (1.88 m)    Weight:   Wt Readings from Last 1 Encounters:  09/16/18 75.5 kg    Ideal Body Weight:  86.36 kg  BMI:  Body mass index is 21.37 kg/m.  Estimated Nutritional Needs:   Kcal:  2525-2780 (30-33 kcal/kg)  Protein:  125-140 grams (~1.5-1.7 grams/kg)  Fluid:  >/= 2.5 L/day     Jarome Matin, MS, RD, LDN, [facility] Inpatient Clinical Dietitian Pager # [phone] After hours/weekend pager # [phone]

## 2018-09-16 NOTE — Consult Note (Addendum)
   [facility] CM Inpatient Consult   09/16/2018  Allen Glenn February 07, 1956 [id number]   Patient screened for Teleph[facility] Care Management follow up due to having [facility] insurance.   Chart reviewed and noted palliative consult. Spoke with inpatient RNCM as well. Likely discharge plan is for SNF.    Marthenia Rolling, MSN-Ed, RN,BSN [facility] Liaison [phone]

## 2018-09-16 NOTE — Progress Notes (Signed)
[facility]) Family Request for Information   Met with family, explained [facility] services and philosophy.  Family and pt tearful, unsure of what the next steps should be for them.  Explained home with hospice vs. residential hospice.  Family very indecisive at this time.    Explained residential hospice is for a patient with less than two weeks of life expectancy.   Family states they would like to go home with home health services to try to get PT to increase the pts endurance.  They did state they would like to have palliative care services at home.    They were appreciative of all information, but are still very hopeful that the pt has more time left.    Thank you, Venia Carbon BSN, [facility] Liaison  [phone]

## 2018-09-16 NOTE — Progress Notes (Signed)
CSW informed by patient's wife that they are not interested in patient discharging to available SNF options. Patient's wife reported that they are considering [facility] versus Home with home health services and hiring additional staff to care for patient. CSW updated patient's RNCM, RNCM to follow up with patient's wife to discuss home health options and DME needs. CSW contacted [facility] liaison and asked if someone could come speak with patient's family to discuss [facility] liaison agreed to follow up with CSW with an time someone could meet with family. CSW will continue to follow and assist with discharge planning.  Abundio Miu, [place] Social Worker [facility] Cell#: [phone]

## 2018-09-16 NOTE — Progress Notes (Signed)
Patient ID: Allen Glenn, male   DOB: 1956/08/14, 62 y.o.   MRN: [id number]  This NP visited patient at the bedside as a follow up for palliative medicine needs and emotional support.  Daughter and wife at bedside for scheduled meeting to continue conversation regarding diagnosis and prognosis, goals of care, end-of-life wishes and transition of care options.  Patient is alert and oriented and able to participate in the discussion.  Continued frank discussion regarding the seriousness of his medical situation and the poor short-term prognosis being likely less than a few weeks.  Discussed his significant weakness and  minimal p.o. intake.    Again we discussed the details of transitions of care between home with hospice, residential hospice and skilled nursing facility for rehab  Discussed the importance of clarifying specific goals of care as it relates to artificial feeding and hydration, future use of BiPAP, antibiotics, transfusions and diagnostics before any transition of care knowing his high risk for rapid decompensation.   MOST form reviewed.  Patient's wife and daughter verbalized today a realistic understanding of the current situation however the patient wishes to move forward with an attempt at SNF for rehab;  family wished to honor the patient's wishes and will move forward with rehab placement.    Wife and daughter verbalize their concerns regarding the level of nursing care that the patient is requiring and ability to participate in rehab secondary to his extreme weakness and  continued decline.  Emotional support offered  Questions and concerns addressed   Discussed with Dr Wynelle Cleveland  Total time spent on the unit was 90 minutes   Greater than 50% of the time was spent in counseling and coordination of care  [name] NP  Palliative Medicine Team Team Phone # [phone] Pager [phone]

## 2018-09-17 LAB — BASIC METABOLIC PANEL
ANION GAP: 8 (ref 5–15)
BUN: 11 mg/dL (ref 8–23)
CHLORIDE: 107 mmol/L (ref 98–111)
CO2: 21 mmol/L — AB (ref 22–32)
Calcium: 8 mg/dL — ABNORMAL LOW (ref 8.9–10.3)
Creatinine, Ser: <0.3 mg/dL — ABNORMAL LOW (ref 0.61–1.24)
GLUCOSE: 173 mg/dL — AB (ref 70–99)
Potassium: 3.4 mmol/L — ABNORMAL LOW (ref 3.5–5.1)
Sodium: 136 mmol/L (ref 135–145)

## 2018-09-17 LAB — GLUCOSE, CAPILLARY
GLUCOSE-CAPILLARY: 106 mg/dL — AB (ref 70–99)
GLUCOSE-CAPILLARY: 125 mg/dL — AB (ref 70–99)
GLUCOSE-CAPILLARY: 204 mg/dL — AB (ref 70–99)
Glucose-Capillary: 168 mg/dL — ABNORMAL HIGH (ref 70–99)
Glucose-Capillary: 139 mg/dL — ABNORMAL HIGH (ref 70–99)
Glucose-Capillary: 282 mg/dL — ABNORMAL HIGH (ref 70–99)

## 2018-09-17 MED ORDER — POTASSIUM CHLORIDE CRYS ER 20 MEQ PO TBCR
40.0000 meq | EXTENDED_RELEASE_TABLET | Freq: Once | ORAL | Status: AC
  Administered 2018-09-17: 40 meq via ORAL
  Filled 2018-09-17: qty 2
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
## 2018-09-17 NOTE — Progress Notes (Signed)
   09/17/18 1544  Clinical Encounter Type  Visited With Family  Visit Type Follow-up  Referral From Other (Comment) (Chaplain shared conversation w/family in waiting area.)  Chaplain passed Pt. wife and daughter in waiting area.  Chaplain re-introduced herself. Daughter stated Pt. was receiving a bath.  Chaplain shared her interest in Pt. Progress. Pt. wife explained to chaplain; family is in the process of exploring next level of care and best fit for Pt.  In conversation with wife and daughter, chaplain heard discussion among family taking the opportunity for self care. Chaplain politely stepped away from the conversation as Chaplain observed Pt. wife trying to navigate several conversations with other health care team members.

## 2018-09-17 NOTE — Progress Notes (Signed)
Taking over care of patient. Agree with previous RN assessment. Denies any needs at this time. Will continue to monitor.  

## 2018-09-17 NOTE — Progress Notes (Signed)
PROGRESS NOTE    Allen Glenn  JQB:[id number] DOB: 1956-05-22 DOA: 08/28/2018 PCP: [name], MD    Brief Narrative:  62 year old male with hypertension, coronary artery disease, diabetes mellitus, metastatic colon cancer on chemotherapy who presented to the hospital for fever of 102 degrees, full oxygen level and fast heart rate.  Noted to be febrile and hypoxic in the ED and was found to have an elevated lactic acid.  He was admitted for sepsis suspected to be due to pneumonia.   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[place])   Hypertension   Anemia   DNR (do not resuscitate) discussion   Weakness generalized   Pancytopenia ([place])   Fever   Sepsis ([place])   Chemotherapy-induced neutropenia (HCC)   Electrolyte imbalance   Pneumonia   Atherosclerosis of aorta (HCC)   Hypoxia   Hyponatremia   Hyperkalemia   SOB (shortness of breath)   Protein-calorie malnutrition, severe  Principal Problem:   Acute respiratory failure with hypoxia  - suspected to be bilateral pneumonia progressing to ARDS - the patient was maintained on a BiPAP and did not need intubation -CT chest performed 9/7 shows extensive opacities throughout both lungs. Small bilateral pleural effusion and bilateral lung nodule compatible with metastatic disease. - - ID had been assisting with management of antibiotics-  Levofloxacin stopped 9/17- Vancomycin stopped on 9/18 - Bactrim stopped 9/23  -  Currently on 4LNC-- will continue to wean as able- encourage IS and OOB daily - continue with steroid wean as  tolerated  Active Problems:   Colon cancer s/p right colectomy with metastasis to the lungs, bone, liver and brain - above episode of respiratory failure occurred after the patient received his first chemotherapy -For now chemotherapy has been on hold as respiratory issues are being treated -Had been followed by [name] who tells me that he is no longer a candidate for further chemotherapy -Pt is now pending transfer to [facility], pending bed availability  Dysphagia - Currently on D 1 diet due to being a high aspiration risk-   advanced to mechanical soft as he is craving more solid food- patient and family understand aspiration risk which I have discussed with them in detail  Hypotension with h/o HTN (hypertension) - Lisinopril remains on hold   Insulin dependent type 2 diabetes mellitus, uncontrolled  - cont ISS AC/HS and Lantus - Last A1c 7.9.  - sugars high due to steroids -weaning insulin as steroids are being weaned   Hyponatremia - d/c Lasix on 9/18 as he appeared dehydrated    - has grade 1 d CHF and was not on Lasix at home - sodium has dropped down to 126-127 range   - ? If this is related to Bactrim- he has been receiving about 1.5 L of infusions with Bactrim daily as well - have switched to oral Bactrim -Labs reviewed. Sodium normalized. Will hold further IVF   Hyperkalemia - ongoing hyperkalemia- possibly due to Bactrim  -Will replace, K of 3.4 today    Normocytic anemia - likely AOCD -Hemodynamically stable  at this time  Thrombocytopenia - noted to have a drop in platelets on lab work on 7/22 and has remained low since- - Platelets have been stable at 60-90 range - No evidence of acute blood loss noted  Severe deconditioning - needs 2 + assistance to get out of bed and can barely finish a solid meal - Dispo plans for [employer] [facility] per SW  Severe fatigue/ sedated - recently changed bedtime Ativan to PRN- weaned down Neurontin -Mentation  appears at baseline this AM  DVT prophylaxis: SCD's Code Status: DNR Family Communication: Pt in room, family at bedside Disposition Plan: [employer] [facility], pending bed availability  Consultants:   Palliative Care  ID  Procedures:     Antimicrobials: Anti-infectives (From admission, onward)   Start     Dose/Rate Route Frequency Ordered Stop   09/12/18 2200  sulfamethoxazole-trimethoprim (BACTRIM DS,SEPTRA DS) 800-160 MG per tablet 2 tablet     2 tablet Oral Every 8 hours 09/12/18 1224 09/15/18 1247   09/05/18 2200  vancomycin (VANCOCIN) IVPB 1000 mg/200 mL premix  Status:  Discontinued     1,000 mg 200 mL/hr over 60 Minutes Intravenous Every 12 hours 09/05/18 1353 09/10/18 1410   09/01/18 1800  sulfamethoxazole-trimethoprim (BACTRIM) 420 mg in dextrose 5 % 500 mL IVPB  Status:  Discontinued     420 mg 350.8 mL/hr over 90 Minutes Intravenous Every 8 hours 09/01/18 1529 09/12/18 1345   09/01/18 1000  fluconazole (DIFLUCAN) IVPB 400 mg  Status:  Discontinued     400 mg 100 mL/hr over 120 Minutes Intravenous Every 24 hours 09/01/18 0838 09/09/18 1512   08/31/18 2200  vancomycin (VANCOCIN) 1,250 mg in sodium chloride 0.9 % 250 mL IVPB  Status:  Discontinued     1,250 mg 166.7 mL/hr over 90 Minutes Intravenous Every 12 hours 08/31/18 0852 09/05/18 1353   08/31/18 1000  meropenem (MERREM) 1 g in sodium chloride 0.9 % 100 mL IVPB  Status:  Discontinued     1 g 200 mL/hr over 30 Minutes Intravenous Every 8 hours 08/31/18 0848 09/01/18 1546   08/31/18 1000  vancomycin (VANCOCIN) 1,750 mg in sodium chloride 0.9 % 500 mL IVPB     1,750 mg 250 mL/hr over 120 Minutes Intravenous  Once 08/31/18 0852 08/31/18 1146   08/29/18 1400  levofloxacin (LEVAQUIN) IVPB 750 mg     750 mg 100 mL/hr over 90 Minutes Intravenous Every 24 hours 08/29/18 1353 09/11/18 1400   08/28/18 2000  vancomycin (VANCOCIN) 1,250 mg in sodium chloride 0.9 % 250 mL IVPB  Status:  Discontinued     1,250 mg 166.7 mL/hr  over 90 Minutes Intravenous Every 12 hours 08/28/18 1725 08/30/18 0853   08/28/18 1500  piperacillin-tazobactam (ZOSYN) IVPB 3.375 g  Status:  Discontinued     3.375 g 12.5 mL/hr over 240 Minutes Intravenous Every 8 hours 08/28/18 1245 08/31/18 0844   08/28/18 0900  vancomycin (VANCOCIN) 1,500 mg in sodium chloride 0.9 % 500 mL IVPB     1,500 mg 250 mL/hr over 120 Minutes Intravenous  Once 08/28/18 0854 08/28/18 1218   08/28/18 0900  piperacillin-tazobactam (ZOSYN) IVPB 3.375 g     3.375 g 100 mL/hr over 30 Minutes Intravenous  Once 08/28/18 0854 08/28/18 0935       Subjective: Without complaints at this time  Objective: Vitals:   09/17/18 0404 09/17/18 0409 09/17/18 0740 09/17/18 1441  BP: 118/61   125/72  Pulse: (!) 102  95 (!) 110  Resp: 20  16   Temp: 97.8 F (36.6 C)   97.9 F (36.6 C)  TempSrc: Oral   Oral  SpO2: 91%  90% 94%  Weight:  76.9 kg    Height:        Intake/Output Summary (Last 24 hours) at 09/17/2018 1521 Last data filed at 09/17/2018 1110 Gross per 24 hour  Intake 2912.71 ml  Output 275 ml  Net 2637.71 ml   Filed Weights   09/15/18 0400 09/16/18 0403 09/17/18 0409  Weight: 76 kg 75.5 kg 76.9 kg    Examination:  General exam: Appears calm and comfortable  Respiratory system: Clear to auscultation. Respiratory effort normal. Cardiovascular system: S1 & S2 heard, RRR Gastrointestinal system: Abdomen is nondistended, soft and nontender. No organomegaly or masses felt. Normal bowel sounds heard. Central nervous system: Alert and oriented. No focal neurological deficits. Extremities: Symmetric 5 x 5 power. Skin: No rashes, lesions Psychiatry: Judgement and insight appear normal. Mood & affect appropriate.   Data Reviewed: I have personally reviewed following labs and imaging studies  CBC: Recent Labs  Lab 09/11/18 1025 09/13/18 0400  WBC 12.4* 15.5*  HGB 9.5* 9.4*  HCT 28.9* 28.2*  MCV 93.8 93.1  PLT 87* 91*   Basic Metabolic  Panel: Recent Labs  Lab 09/14/18 0447 09/14/18 1627 09/15/18 0320 09/16/18 0623 09/17/18 0427  NA 127* 129* 131* 135 136  K 4.9 4.4 4.1 3.6 3.4*  CL 92* 96* 98 103 107  CO2 22 22 22 22  21*  GLUCOSE 160* 136* 82 80 173*  BUN 16 15 14 11 11   CREATININE 0.39* 0.37* 0.32* <0.30* <0.30*  CALCIUM 8.4* 8.3* 8.1* 7.8* 8.0*   GFR: CrCl cannot be calculated (This lab value cannot be used to calculate CrCl because it is not a number: <0.30). Liver Function Tests: No results for input(s): AST, ALT, ALKPHOS, BILITOT, PROT, ALBUMIN in the last 168 hours. No results for input(s): LIPASE, AMYLASE in the last 168 hours. No results for input(s): AMMONIA in the last 168 hours. Coagulation Profile: No results for input(s): INR, PROTIME in the last 168 hours. Cardiac Enzymes: No results for input(s): CKTOTAL, CKMB, CKMBINDEX, TROPONINI in the last 168 hours. BNP (last 3 results) No results for input(s): PROBNP in the last 8760 hours. HbA1C: No results for input(s): HGBA1C in the last 72 hours. CBG: Recent Labs  Lab 09/16/18 1134 09/16/18 1632 09/16/18 2034 09/16/18 2346 09/17/18 0406  GLUCAP 151* 232* 215* 196* 139*   Lipid Profile: No results for input(s): CHOL, HDL, LDLCALC, TRIG, CHOLHDL, LDLDIRECT in the last 72 hours. Thyroid Function Tests: No results for input(s): TSH, T4TOTAL, FREET4, T3FREE, THYROIDAB in the last 72 hours. Anemia Panel: No results for input(s): VITAMINB12, FOLATE, FERRITIN, TIBC, IRON, RETICCTPCT in the last 72 hours. Sepsis Labs: No results for input(s): PROCALCITON, LATICACIDVEN in the last 168 hours.  No results found for this or any previous visit (from the past 240 hour(s)).   Radiology Studies: No results found.  Scheduled Meds: . bisacodyl  10 mg Rectal Once  . chlorhexidine  15 mL Mouth Rinse BID  . Chlorhexidine Gluconate Cloth  6 each Topical Daily  . gabapentin  100 mg Oral BID  . gabapentin  300 mg Oral QHS  . insulin aspart  0-15 Units  Subcutaneous TID WC  . insulin glargine  28 Units Subcutaneous QHS  . mouth rinse  15 mL Mouth Rinse q12n4p  . multivitamin  15 mL Oral Daily  . nystatin  5 mL Oral  QID  . pantoprazole  40 mg Oral Daily  . polyvinyl alcohol  1 drop Both Eyes TID  . predniSONE  50 mg Oral Q breakfast  . protein supplement shake  11 oz Oral TID BM  . sodium chloride flush  3 mL Intravenous Q12H  . sodium chloride  1 g Oral TID WC   Continuous Infusions: . sodium chloride Stopped (08/30/18 0808)  . sodium chloride 10 mL/hr at 09/14/18 0600  . sodium chloride 75 mL/hr at 09/17/18 1239     LOS: 20 days   Marylu Lund, MD [facility] Pager On Amion  If 7PM-7AM, please contact night-coverage 09/17/2018, 3:21 PM

## 2018-09-17 NOTE — Progress Notes (Signed)
CSW informed by patient/patient's family that patient is interested in discharging to [facility]. CSW agreed to make referral.  CSW made referral to [facility].  CSW updated patient's attending MD.  CSW will continue to follow and assist with discharge planning.   Abundio Miu, [place] Social Worker [facility] Cell#: [phone]

## 2018-09-17 NOTE — Progress Notes (Signed)
Physical Therapy Treatment Patient Details Name: Allen Glenn MRN: [id number] DOB: [birth year] Today's Date: 09/17/2018    History of Present Illness Pt admitted with sepsis/PNA and with hx significant of lumbar compression fx late June 2019,  hypertension, diabetes mellitus type 2, BPH, history of diastolic CHF, hyperlipidemia, CAD, metastatic colon cancer with brain metastasis as well as thoracic disease.    PT Comments    +2 total assist for supine to sit, pt sat on edge of bed x 7 1/2 minutes with mod assist 2* posterior lean. In sitting pt worked on cervical extension AROM and trunk rotation AAROM. Sitting tolerance limited by fatigue. Instructed pt's wife and daughter in bed exercises: Supine AAROM B shoulder flexion, elbow flexion/extension isometrics, gluteal and quad sets, and ankle pumps with gastroc stretch from caregivers. Handout of exercises with link to videos given to pt's family.   Access Code: [email or web address]  URL: [email or web address]  Date: 09/17/2018  Prepared by: Blondell Reveal   Exercises  Supine Quadricep Sets - 10 reps - 3 sets - 1x daily - 7x weekly  Supine Gluteal Sets - 10 reps - 3 sets - 1x daily - 7x weekly  Hooklying Upper Neck Extension - 10 reps - 3 sets - 1x daily - 7x weekly  Supine Ankle Dorsiflexion Stretch with Caregiver - 10 reps - 3 sets - 1x daily - 7x weekly  Supine Shoulder Flexion PROM with Caregiver - 10 reps - 3 sets - 1x daily - 7x weekly    Follow Up Recommendations  SNF;Supervision/Assistance - 24 hour     Equipment Recommendations  None recommended by PT    Recommendations for Other Services       Precautions / Restrictions Precautions Precautions: Fall Precaution Comments: Elevated HR Restrictions Weight Bearing Restrictions: No    Mobility  Bed Mobility Overal bed mobility: Needs Assistance Bed Mobility: Rolling;Sidelying to Sit Rolling: +2 for physical assistance;Total assist Sidelying to sit: +2 for  physical assistance;Total assist     Sit to sidelying: Total assist;+2 for physical assistance General bed mobility comments: pt 15%, total assist to advance LEs and raise trunk; pt sat at EOB x 7 1/2  minutes with mod assist for balance 2* posterior lean, worked on lifting head, cervical rotation AROM and active assisted trunk rotation to L and R, sitting tolerance limited by fatigue, HR 114 with activity  Transfers                    Ambulation/Gait                 Stairs             Wheelchair Mobility    Modified Rankin (Stroke Patients Only)       Balance Overall balance assessment: Needs assistance Sitting-balance support: Feet supported;Bilateral upper extremity supported Sitting balance-Leahy Scale: Poor Sitting balance - Comments: mod A for static sitting 2* posterior lean, sat at EOB x 7 1/73minutes, VCs and manual cues to lift head Postural control: Posterior lean;Right lateral lean                                  Cognition Arousal/Alertness: Awake/alert Behavior During Therapy: WFL for tasks assessed/performed Overall Cognitive Status: Within Functional Limits for tasks assessed  Exercises General Exercises - Lower Extremity Ankle Circles/Pumps: AAROM;10 reps;Both(with manual gastroc stretch) Quad Sets: AROM;Both;Supine(x 3 reps) Gluteal Sets: AROM;Both(x 3 reps) Long Arc Quad: AAROM;Both;5 reps;Seated Shoulder Exercises Shoulder Flexion: AAROM;Both;5 reps;Supine Elbow Flexion: Both;Supine(manually resisted x 3) Elbow Extension: Both;Supine(manually resisted x 3)    General Comments        Pertinent Vitals/Pain Faces Pain Scale: No hurt    Home Living                      Prior Function            PT Goals (current goals can now be found in the care plan section) Acute Rehab PT Goals Patient Stated Goal: to get stronger PT Goal Formulation: With  patient/family Time For Goal Achievement: 09/28/18 Potential to Achieve Goals: Good Progress towards PT goals: Progressing toward goals    Frequency    Min 2X/week      PT Plan Current plan remains appropriate    Co-evaluation              AM-PAC PT "6 Clicks" Daily Activity  Outcome Measure  Difficulty turning over in bed (including adjusting bedclothes, sheets and blankets)?: Unable Difficulty moving from lying on back to sitting on the side of the bed? : Unable Difficulty sitting down on and standing up from a chair with arms (e.g., wheelchair, bedside commode, etc,.)?: Unable Help needed moving to and from a bed to chair (including a wheelchair)?: Total Help needed walking in hospital room?: Total Help needed climbing 3-5 steps with a railing? : Total 6 Click Score: 6    End of Session Equipment Utilized During Treatment: Oxygen Activity Tolerance: Patient limited by fatigue Patient left: in bed;with call bell/phone within reach;with family/visitor present Glenn Communication: Mobility status;Need for lift equipment PT Visit Diagnosis: Difficulty in walking, not elsewhere classified (R26.2);Muscle weakness (generalized) (M62.81)     Time: 8657-8469 PT Time Calculation (min) (ACUTE ONLY): 40 min  Charges:  $Therapeutic Exercise: 8-22 mins $Therapeutic Activity: 23-37 mins                     Blondell Reveal [place] PT 09/17/2018  Acute Rehabilitation Services Pager [phone] Office [phone]

## 2018-09-17 NOTE — Progress Notes (Signed)
[facility] Liaison note.  Received request from Abundio Miu, [place] for patient/family interest in [facility]. Chart reviewed and spoke with family to acknowledge referral.  Unfortunately [facility] is not able to offer a room today. Family and CSW are aware [facility] liaison will follow up with CSW and family tomorrow or sooner if room becomes available.  Please do not hesitate to call with questions.  Thank you,  Farrel Gordon, RN, [facility] Liaison  [place] are on AMION

## 2018-09-18 ENCOUNTER — Inpatient Hospital Stay: Payer: 59

## 2018-09-18 ENCOUNTER — Inpatient Hospital Stay (HOSPITAL_COMMUNITY): Payer: 59

## 2018-09-18 LAB — GLUCOSE, CAPILLARY
GLUCOSE-CAPILLARY: 277 mg/dL — AB (ref 70–99)
GLUCOSE-CAPILLARY: 179 mg/dL — AB (ref 70–99)
Glucose-Capillary: 187 mg/dL — ABNORMAL HIGH (ref 70–99)
Glucose-Capillary: 247 mg/dL — ABNORMAL HIGH (ref 70–99)

## 2018-09-18 MED ORDER — HYDROCOD POLST-CPM POLST ER 10-8 MG/5ML PO SUER: 5.0000 mL | Freq: Once | ORAL | Status: DC

## 2018-09-18 MED ORDER — MORPHINE SULFATE (PF) 2 MG/ML IV SOLN
1.0000 mg | INTRAVENOUS | Status: DC | PRN
  Administered 2018-09-20 – 2018-09-21 (×2): 1 mg via INTRAVENOUS
  Filled 2018-09-18 (×2): qty 1

## 2018-09-18 MED ORDER — MORPHINE SULFATE 15 MG PO TABS: 15.0000 mg | ORAL_TABLET | ORAL | Status: DC | PRN

## 2018-09-18 NOTE — Progress Notes (Signed)
PROGRESS NOTE    Allen Glenn  YKD:[id number] DOB: 1956/12/18 DOA: 08/28/2018 PCP: [name], MD    Brief Narrative:  62 year old male with hypertension, coronary artery disease, diabetes mellitus, metastatic colon cancer on chemotherapy who presented to the hospital for fever of 102 degrees, full oxygen level and fast heart rate.  Noted to be febrile and hypoxic in the ED and was found to have an elevated lactic acid.  He was admitted for sepsis suspected to be due to pneumonia.   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[place])   Hypertension   Anemia   DNR (do not resuscitate) discussion   Weakness generalized   Pancytopenia ([place])   Fever   Sepsis ([place])   Chemotherapy-induced neutropenia (HCC)   Electrolyte imbalance   Pneumonia   Atherosclerosis of aorta (HCC)   Hypoxia   Hyponatremia   Hyperkalemia   SOB (shortness of breath)   Protein-calorie malnutrition, severe  Principal Problem:   Acute respiratory failure with hypoxia, worsening - suspected to be bilateral pneumonia progressing to ARDS - the patient was initially maintained on a BiPAP  -CT chest performed 9/7 shows extensive opacities throughout both lungs. Small bilateral pleural effusion and bilateral lung nodule compatible with metastatic disease. - - ID had been assisting with management of antibiotics-  Levofloxacin stopped 9/17- Vancomycin stopped on 9/18 - Bactrim stopped 9/23  -  Pt had been on [place] yesterday, however overnight, became markedly hypoxemic requiring up to 15L high flow O2, currently 10L high  flow - CXR reviewed, findings worrisome for new bilateral infiltrates - Met with family. Shared concerns that despite aggressive care, patient's condition has declined rapidly overnight. Anticipate prognosis is less than 2 weeks. Presently appears comfortable on high flow O2 -Patient is alert, oriented, conversant. States he is aware of his worsening prognosis and that his goal is to pass peacefully in his sleep, ideally soon, and that he has lived a good life with a wonderful family -Have updated Palliative Care. Added q1hr IV morphine for severe pain or air hunger -No [facility] bed available today. Will continue to follow  Active Problems:   Colon cancer s/p right colectomy with metastasis to the lungs, bone, liver and brain - above episode of respiratory failure occurred after the patient received his first chemotherapy -For now chemotherapy has been on hold as respiratory issues are being treated -Had been followed by [name] who tells me that he is no longer a candidate for further chemotherapy -Per above, pending [facility]  Dysphagia - Currently on D 1 diet due to being a high aspiration risk-   advanced to mechanical soft as he is craving more solid food- patient and family understand aspiration risk. Continuing comfort feeds  Hypotension with h/o HTN (hypertension) - Lisinopril remains on hold at this time   Insulin dependent type 2 diabetes mellitus, uncontrolled  - cont ISS AC/HS and Lantus - Last A1c 7.9.  - sugars high due to steroids -weaning insulin as steroids are being weaned. Stable at this time   Hyponatremia - d/c Lasix  on 9/18 as he appeared dehydrated    - has grade 1 d CHF and was not on Lasix at home - sodium has dropped down to 126-127 range   - ? If this is related to Bactrim- he has been receiving about 1.5 L of infusions with Bactrim daily as well - have switched to oral Bactrim -Labs reviewed. Sodium normalized. Further IVF held     Hyperkalemia - ongoing hyperkalemia- possibly due to Bactrim  -given potassium replacement recently    Normocytic anemia - likely AOCD -Had remained hemodynamically stable at this time  Thrombocytopenia - noted to have a drop in platelets on lab work on 7/22 and has remained low since- - Platelets have been stable at 60-90 range - Without evidence of blood loss  Severe deconditioning - needs 2 + assistance to get out of bed and can barely finish a solid meal - Dispo plans for [facility] per SW, pending  Severe fatigue/ sedated - recently changed bedtime Ativan to PRN- weaned down Neurontin -Mentation appears at baseline this AM  DVT prophylaxis: SCD's Code Status: DNR Family Communication: Pt in room, family at bedside Disposition Plan: [facility], pending bed availability  Consultants:   Palliative Care  ID  Procedures:     Antimicrobials: Anti-infectives (From admission, onward)   Start     Dose/Rate Route Frequency Ordered Stop   09/12/18 2200  sulfamethoxazole-trimethoprim (BACTRIM DS,SEPTRA DS) 800-160 MG per tablet 2 tablet     2 tablet Oral Every 8 hours 09/12/18 1224 09/15/18 1247   09/05/18 2200  vancomycin (VANCOCIN) IVPB 1000 mg/200 mL premix  Status:  Discontinued     1,000 mg 200 mL/hr over 60 Minutes Intravenous Every 12 hours 09/05/18 1353 09/10/18 1410   09/01/18 1800  sulfamethoxazole-trimethoprim (BACTRIM) 420 mg in dextrose 5 % 500 mL IVPB  Status:  Discontinued     420 mg 350.8 mL/hr over 90 Minutes Intravenous Every 8 hours 09/01/18 1529 09/12/18 1345   09/01/18 1000  fluconazole (DIFLUCAN) IVPB 400 mg  Status:  Discontinued     400 mg 100 mL/hr over 120 Minutes Intravenous Every 24 hours 09/01/18 0838 09/09/18 1512   08/31/18 2200  vancomycin (VANCOCIN) 1,250 mg in sodium chloride 0.9 % 250 mL IVPB  Status:  Discontinued     1,250 mg 166.7 mL/hr over 90 Minutes Intravenous Every 12 hours 08/31/18 0852 09/05/18 1353   08/31/18 1000   meropenem (MERREM) 1 g in sodium chloride 0.9 % 100 mL IVPB  Status:  Discontinued     1 g 200 mL/hr over 30 Minutes Intravenous Every 8 hours 08/31/18 0848 09/01/18 1546   08/31/18 1000  vancomycin (VANCOCIN) 1,750 mg in sodium chloride 0.9 % 500 mL IVPB     1,750 mg 250 mL/hr over 120 Minutes Intravenous  Once 08/31/18 0852 08/31/18 1146   08/29/18 1400  levofloxacin (LEVAQUIN) IVPB 750 mg     750 mg 100 mL/hr over 90 Minutes Intravenous Every 24 hours 08/29/18 1353 09/11/18 1400   08/28/18 2000  vancomycin (VANCOCIN) 1,250 mg in sodium chloride 0.9 % 250 mL IVPB  Status:  Discontinued     1,250 mg 166.7 mL/hr over 90 Minutes Intravenous Every 12 hours 08/28/18 1725 08/30/18 0853   08/28/18 1500  piperacillin-tazobactam (ZOSYN) IVPB 3.375 g  Status:  Discontinued     3.375 g 12.5 mL/hr over 240 Minutes Intravenous Every 8 hours 08/28/18 1245 08/31/18 0844   08/28/18 0900  vancomycin (VANCOCIN) 1,500 mg in sodium  chloride 0.9 % 500 mL IVPB     1,500 mg 250 mL/hr over 120 Minutes Intravenous  Once 08/28/18 0854 08/28/18 1218   08/28/18 0900  piperacillin-tazobactam (ZOSYN) IVPB 3.375 g     3.375 g 100 mL/hr over 30 Minutes Intravenous  Once 08/28/18 0854 08/28/18 0935      Subjective: Denies sob at this time  Objective: Vitals:   09/18/18 0425 09/18/18 0425 09/18/18 0429 09/18/18 1345  BP:  (!) 102/59  129/63  Pulse:  90  (!) 115  Resp:  (!) 22  18  Temp:  98.9 F (37.2 C)  97.9 F (36.6 C)  TempSrc:  Oral  Oral  SpO2: 90% 91% 94% 97%  Weight:   80.3 kg   Height:        Intake/Output Summary (Last 24 hours) at 09/18/2018 1740 Last data filed at 09/18/2018 0300 Gross per 24 hour  Intake 1823.51 ml  Output 2 ml  Net 1821.51 ml   Filed Weights   09/16/18 0403 09/17/18 0409 09/18/18 0429  Weight: 75.5 kg 76.9 kg 80.3 kg    Examination: General exam: Awake, laying in bed, in nad Respiratory system: Normal respiratory effort, no wheezing Cardiovascular system: regular  rate, s1, s2 Gastrointestinal system: Soft, nondistended, positive BS Central nervous system: CN2-12 grossly intact, strength intact Extremities: Perfused, no clubbing Skin: Normal skin turgor, no notable skin lesions seen Psychiatry: Mood normal // no visual hallucinations   Data Reviewed: I have personally reviewed following labs and imaging studies  CBC: Recent Labs  Lab 09/13/18 0400  WBC 15.5*  HGB 9.4*  HCT 28.2*  MCV 93.1  PLT 91*   Basic Metabolic Panel: Recent Labs  Lab 09/14/18 0447 09/14/18 1627 09/15/18 0320 09/16/18 0623 09/17/18 0427  NA 127* 129* 131* 135 136  K 4.9 4.4 4.1 3.6 3.4*  CL 92* 96* 98 103 107  CO2 22 22 22 22  21*  GLUCOSE 160* 136* 82 80 173*  BUN 16 15 14 11 11   CREATININE 0.39* 0.37* 0.32* <0.30* <0.30*  CALCIUM 8.4* 8.3* 8.1* 7.8* 8.0*   GFR: CrCl cannot be calculated (This lab value cannot be used to calculate CrCl because it is not a number: <0.30). Liver Function Tests: No results for input(s): AST, ALT, ALKPHOS, BILITOT, PROT, ALBUMIN in the last 168 hours. No results for input(s): LIPASE, AMYLASE in the last 168 hours. No results for input(s): AMMONIA in the last 168 hours. Coagulation Profile: No results for input(s): INR, PROTIME in the last 168 hours. Cardiac Enzymes: No results for input(s): CKTOTAL, CKMB, CKMBINDEX, TROPONINI in the last 168 hours. BNP (last 3 results) No results for input(s): PROBNP in the last 8760 hours. HbA1C: No results for input(s): HGBA1C in the last 72 hours. CBG: Recent Labs  Lab 09/17/18 1213 09/17/18 1646 09/18/18 0005 09/18/18 1132 09/18/18 1701  GLUCAP 204* 282* 187* 247* 277*   Lipid Profile: No results for input(s): CHOL, HDL, LDLCALC, TRIG, CHOLHDL, LDLDIRECT in the last 72 hours. Thyroid Function Tests: No results for input(s): TSH, T4TOTAL, FREET4, T3FREE, THYROIDAB in the last 72 hours. Anemia Panel: No results for input(s): VITAMINB12, FOLATE, FERRITIN, TIBC, IRON, RETICCTPCT  in the last 72 hours. Sepsis Labs: No results for input(s): PROCALCITON, LATICACIDVEN in the last 168 hours.  No results found for this or any previous visit (from the past 240 hour(s)).   Radiology Studies: Dg Chest Port 1 View  Result Date: 09/18/2018 CLINICAL DATA:  Shortness of Breath EXAM: PORTABLE CHEST 1  VIEW COMPARISON:  09/10/2018 FINDINGS: Cardiac shadow is stable. Right chest wall port is again seen and stable. New patchy infiltrate is noted in the right lung base when compared with the prior exam. Some scattered opacities are noted in the left lung base relatively stable from the prior exam. No sizable effusion is seen. No bony abnormality is noted. Aortic calcifications are again seen. IMPRESSION: Bilateral basilar infiltrates right greater than left. The changes on the right are new from the prior exam. Electronically Signed   By: Inez Catalina M.D.   On: 09/18/2018 09:26    Scheduled Meds: . bisacodyl  10 mg Rectal Once  . chlorhexidine  15 mL Mouth Rinse BID  . Chlorhexidine Gluconate Cloth  6 each Topical Daily  . chlorpheniramine-HYDROcodone  5 mL Oral Once  . gabapentin  100 mg Oral BID  . gabapentin  300 mg Oral QHS  . insulin aspart  0-15 Units Subcutaneous TID WC  . insulin glargine  28 Units Subcutaneous QHS  . mouth rinse  15 mL Mouth Rinse q12n4p  . multivitamin  15 mL Oral Daily  . nystatin  5 mL Oral QID  . pantoprazole  40 mg Oral Daily  . polyvinyl alcohol  1 drop Both Eyes TID  . predniSONE  50 mg Oral Q breakfast  . protein supplement shake  11 oz Oral TID BM  . sodium chloride flush  3 mL Intravenous Q12H  . sodium chloride  1 g Oral TID WC   Continuous Infusions: . sodium chloride Stopped (08/30/18 0808)  . sodium chloride 10 mL/hr at 09/14/18 0600  . sodium chloride 75 mL/hr at 09/18/18 1416     LOS: 21 days   Marylu Lund, MD [facility] Pager On Amion  If 7PM-7AM, please contact night-coverage 09/18/2018, 5:40 PM

## 2018-09-18 NOTE — Progress Notes (Signed)
Chaplain following due to consult from MD re: end of life.    Consulted with Dr. Wyline Copas, provided support with Mr Smolenski, Spouse [name], son and daughter at bedside.   Engaged in life review, identifying values and care that they wish to live into these moments.   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  He has been married to his spouse for 39 years - a time they describe as a perpetual honeymoon.  She describes leaning on him for peace and grounding and expresses pride in their family.  Family spoke characteristics and memories they wish to impart their three grandchildren (two 65 year old boys and a 61 year old girl). Family spoke of listening for God's guidance in these moments.    Shared prayers with family at request.  Mr Mackowski prayed for strength and peace for his family.    Jerene Pitch, MDiv, [facility]

## 2018-09-18 NOTE — Progress Notes (Signed)
[facility] Liaison note.  Unfortunately [facility] is not able to offer a room today. Family and CSW are aware [facility] liaison will continue to follow up with CSW and family tomorrow or sooner if room becomes available.  Please do not hesitate to call with questions.  Thank you,  Farrel Gordon, RN, [facility] Liaison  [place] are on AMION

## 2018-09-19 LAB — GLUCOSE, CAPILLARY
GLUCOSE-CAPILLARY: 146 mg/dL — AB (ref 70–99)
Glucose-Capillary: 157 mg/dL — ABNORMAL HIGH (ref 70–99)
Glucose-Capillary: 138 mg/dL — ABNORMAL HIGH (ref 70–99)
Glucose-Capillary: 267 mg/dL — ABNORMAL HIGH (ref 70–99)
Glucose-Capillary: 278 mg/dL — ABNORMAL HIGH (ref 70–99)
Glucose-Capillary: 178 mg/dL — ABNORMAL HIGH (ref 70–99)

## 2018-09-19 MED ORDER — TAMSULOSIN HCL 0.4 MG PO CAPS
0.4000 mg | ORAL_CAPSULE | Freq: Every day | ORAL | Status: DC
  Administered 2018-09-19 – 2018-09-22 (×4): 0.4 mg via ORAL
  Filled 2018-09-19 (×4): qty 1

## 2018-09-19 MED ORDER — ADULT MULTIVITAMIN W/MINERALS CH
1.0000 | ORAL_TABLET | Freq: Every day | ORAL | Status: DC
  Administered 2018-09-19 – 2018-09-22 (×4): 1 via ORAL
  Filled 2018-09-19 (×4): qty 1

## 2018-09-19 NOTE — Progress Notes (Signed)
[facility] Liaison note.  Unfortunately [facility] is not able to offer a room today. Family and Nida Boatman are aware [facility] liaison will continue to follow up with CSW and family tomorrow or sooner if room becomes available.  Please do not hesitate to call with questions.  Thank you,  Farrel Gordon, RN, [facility] Liaison  [place] are on AMION

## 2018-09-19 NOTE — Progress Notes (Signed)
PT Cancellation Note  Patient Details Name: Allen Glenn MRN: [id number] DOB: 07/31/1956   Cancelled Treatment:     per chart review, pt's medical condition has declined in which "end of Life" plans are in place.  Will update LPT.   Rica Koyanagi  PTA Acute  Rehabilitation Services Pager      [phone] Office      [phone]

## 2018-09-19 NOTE — Progress Notes (Addendum)
Patient ID: JAMICAH ANSTEAD, male   DOB: [birth year], 62 y.o.   MRN: [id number]   Spoke with Dr Earlie Counts by phone regarding patient situation. Patient has  decompensation over the past 24 hours.  I shared with him the struggle this family has had accepting the limited prognosis and impending death.   After Dr Earlie Counts visti this afternoon, he tells me decision has been made for no further escalation of care.    This NP spoke to daughter/Krista  by phone.  Family  have waffled with decision regarding transition of care. Daleen Snook tells me family would like patient to remain in hospital until his death.  They have been speaking with [facility] regarding a bed at [facility], there may be bed availability in the morning.  I shared with her that typically unless a pateint is unsafe for transfer or death is eminent, patients are safely transfered to the next level of care.    I encouraged them to speak with attending in the morning regarding their concerns.  Emotional support offered  Questions and concerns addressed    No charge  [name] NP  Palliative Medicine Team Team Phone # [phone] Pager [phone]

## 2018-09-19 NOTE — Care Management Note (Signed)
Case Management Note  Patient Details  Name: JAKEVION ARNEY MRN: [id number] Date of Birth: Apr 25, 1956  Subjective/Objective:Noted comfort care. Palliative care-following. DNR. Spoke to patient/spouse in rm about d/c plans-their grandchildren are coming to visit today,they understand their is no bed @ [facility] per liason,& they want to stay in the hospital-MD updated.                    Action/Plan:dc [facility]   Expected Discharge Date:  (unknown)               Expected Discharge Plan:  [place]  In-House Referral:  Clinical Social Work  Discharge planning Services  CM Consult  Post Acute Care Choice:    Choice offered to:     DME Arranged:    DME Agency:     HH Arranged:    [place] Agency:     Status of Service:  In process, will continue to follow  If discussed at Long Length of Stay Meetings, dates discussed:    Additional Comments:  Dessa Phi, RN 09/19/2018, 1:13 PM

## 2018-09-19 NOTE — Progress Notes (Signed)
PROGRESS NOTE    Allen Glenn  FAO:[id number] DOB: [birth year] DOA: 08/28/2018 PCP: [name], MD    Brief Narrative:  62 year old male with hypertension, coronary artery disease, diabetes mellitus, metastatic colon cancer on chemotherapy who presented to the hospital for fever of 102 degrees, full oxygen level and fast heart rate.  Noted to be febrile and hypoxic in the ED and was found to have an elevated lactic acid.  He was admitted for sepsis suspected to be due to pneumonia.   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[place])   Hypertension   Anemia   DNR (do not resuscitate) discussion   Weakness generalized   Pancytopenia ([place])   Fever   Sepsis ([place])   Chemotherapy-induced neutropenia (HCC)   Electrolyte imbalance   Pneumonia   Atherosclerosis of aorta (HCC)   Hypoxia   Hyponatremia   Hyperkalemia   SOB (shortness of breath)   Protein-calorie malnutrition, severe  Principal Problem:   Acute respiratory failure with hypoxia, worsening - suspected to be bilateral pneumonia progressing to ARDS - the patient was initially maintained on a BiPAP  -CT chest performed 9/7 shows extensive opacities throughout both lungs. Small bilateral pleural effusion and bilateral lung nodule compatible with metastatic disease. - - ID had been assisting with management of antibiotics-  Levofloxacin stopped 9/17- Vancomycin stopped on 9/18 - Bactrim stopped 9/23  -  Pt had been on [place] yesterday, however overnight, became markedly hypoxemic requiring up to 15L high flow O2, currently 10L high  flow - CXR reviewed, findings worrisome for new bilateral infiltrates - Met with family. Shared concerns that despite aggressive care, patient's condition has declined rapidly overnight. Anticipate prognosis is less than 2 weeks. Presently appears comfortable on high flow O2 -Patient is alert, oriented, conversant. States he is aware of his worsening prognosis and that his goal is to pass peacefully in his sleep, ideally soon, and that he has lived a good life with a wonderful family -This AM, appears comfortable on 10L O2 -Still no [facility] bed. -Continue on PRN morphine as needed  Active Problems:   Colon cancer s/p right colectomy with metastasis to the lungs, bone, liver and brain - above episode of respiratory failure occurred after the patient received his first chemotherapy -For now chemotherapy has been on hold as respiratory issues are being treated -Had been followed by [name] who tells me that he is no longer a candidate for further chemotherapy -Per above, pending [facility]  Dysphagia - Currently on D 1 diet due to being a high aspiration risk-   advanced to mechanical soft as he is craving more solid food- patient and family understand aspiration risk. Continuing comfort feeds  Hypotension with h/o HTN (hypertension) - Lisinopril remains on hold at this time   Insulin dependent type 2 diabetes mellitus, uncontrolled  - cont ISS AC/HS and Lantus - Last A1c 7.9.  - sugars high due to steroids -weaning insulin as steroids are being weaned. Stable at this time   Hyponatremia - d/c Lasix on 9/18 as he appeared dehydrated    -  has grade 1 d CHF and was not on Lasix at home - sodium has dropped down to 126-127 range   - ? If this is related to Bactrim- he has been receiving about 1.5 L of infusions with Bactrim daily as well - have switched to oral Bactrim -Labs reviewed. Sodium normalized. Further IVF held   Hyperkalemia - ongoing hyperkalemia- possibly due  to Bactrim  -given potassium replacement recently    Normocytic anemia - likely AOCD -Had remained hemodynamically stable at this time  Thrombocytopenia - noted to have a drop in platelets on lab work on 7/22 and has remained low since- - Platelets have been stable at 60-90 range - Without evidence of blood loss  Severe deconditioning - needs 2 + assistance to get out of bed and can barely finish a solid meal - Dispo plans for [employer] per SW, pending  Severe fatigue/ sedated - recently changed bedtime Ativan to PRN- weaned down Neurontin -Mentation appears at baseline this AM  DVT prophylaxis: SCD's Code Status: DNR Family Communication: Pt in room, family at bedside Disposition Plan: [employer] [facility], pending bed availability  Consultants:   Palliative Care  ID  Procedures:     Antimicrobials: Anti-infectives (From admission, onward)   Start     Dose/Rate Route Frequency Ordered Stop   09/12/18 2200  sulfamethoxazole-trimethoprim (BACTRIM DS,SEPTRA DS) 800-160 MG per tablet 2 tablet     2 tablet Oral Every 8 hours 09/12/18 1224 09/15/18 1247   09/05/18 2200  vancomycin (VANCOCIN) IVPB 1000 mg/200 mL premix  Status:  Discontinued     1,000 mg 200 mL/hr over 60 Minutes Intravenous Every 12 hours 09/05/18 1353 09/10/18 1410   09/01/18 1800  sulfamethoxazole-trimethoprim (BACTRIM) 420 mg in dextrose 5 % 500 mL IVPB  Status:  Discontinued     420 mg 350.8 mL/hr over 90 Minutes Intravenous Every 8 hours 09/01/18 1529 09/12/18 1345   09/01/18 1000  fluconazole (DIFLUCAN) IVPB 400 mg  Status:  Discontinued     400 mg 100 mL/hr over 120 Minutes Intravenous Every 24 hours 09/01/18 0838 09/09/18 1512   08/31/18 2200  vancomycin (VANCOCIN) 1,250 mg in sodium chloride 0.9 % 250 mL IVPB  Status:  Discontinued     1,250 mg 166.7 mL/hr over 90 Minutes Intravenous Every 12 hours 08/31/18 0852 09/05/18 1353   08/31/18 1000  meropenem (MERREM) 1 g in sodium chloride 0.9 % 100  mL IVPB  Status:  Discontinued     1 g 200 mL/hr over 30 Minutes Intravenous Every 8 hours 08/31/18 0848 09/01/18 1546   08/31/18 1000  vancomycin (VANCOCIN) 1,750 mg in sodium chloride 0.9 % 500 mL IVPB     1,750 mg 250 mL/hr over 120 Minutes Intravenous  Once 08/31/18 0852 08/31/18 1146   08/29/18 1400  levofloxacin (LEVAQUIN) IVPB 750 mg     750 mg 100 mL/hr over 90 Minutes Intravenous Every 24 hours 08/29/18 1353 09/11/18 1400   08/28/18 2000  vancomycin (VANCOCIN) 1,250 mg in sodium chloride 0.9 % 250 mL IVPB  Status:  Discontinued     1,250 mg 166.7 mL/hr over 90 Minutes Intravenous Every 12 hours 08/28/18 1725 08/30/18 0853   08/28/18 1500  piperacillin-tazobactam (ZOSYN) IVPB 3.375 g  Status:  Discontinued     3.375 g 12.5 mL/hr over 240 Minutes Intravenous Every 8 hours 08/28/18 1245 08/31/18 0844   08/28/18 0900  vancomycin (VANCOCIN) 1,500 mg in sodium chloride 0.9 % 500 mL IVPB     1,500  mg 250 mL/hr over 120 Minutes Intravenous  Once 08/28/18 0854 08/28/18 1218   08/28/18 0900  piperacillin-tazobactam (ZOSYN) IVPB 3.375 g     3.375 g 100 mL/hr over 30 Minutes Intravenous  Once 08/28/18 0854 08/28/18 0935      Subjective: Without complaints  Objective: Vitals:   09/18/18 2040 09/19/18 0422 09/19/18 0627 09/19/18 1434  BP: 131/71 (!) 125/59  122/69  Pulse: (!) 106 (!) 101  (!) 108  Resp: 20 20  16   Temp: 98.8 F (37.1 C) 98 F (36.7 C)  (!) 97.5 F (36.4 C)  TempSrc: Oral Oral  Oral  SpO2: 98% 98%  94%  Weight:   80 kg   Height:        Intake/Output Summary (Last 24 hours) at 09/19/2018 1455 Last data filed at 09/19/2018 1400 Gross per 24 hour  Intake 2612.19 ml  Output 3 ml  Net 2609.19 ml   Filed Weights   09/17/18 0409 09/18/18 0429 09/19/18 0627  Weight: 76.9 kg 80.3 kg 80 kg    Examination: General exam: Conversant, in no acute distress Respiratory system: normal chest rise, clear, no audible wheezing  Data Reviewed: I have personally reviewed  following labs and imaging studies  CBC: Recent Labs  Lab 09/13/18 0400  WBC 15.5*  HGB 9.4*  HCT 28.2*  MCV 93.1  PLT 91*   Basic Metabolic Panel: Recent Labs  Lab 09/14/18 0447 09/14/18 1627 09/15/18 0320 09/16/18 0623 09/17/18 0427  NA 127* 129* 131* 135 136  K 4.9 4.4 4.1 3.6 3.4*  CL 92* 96* 98 103 107  CO2 22 22 22 22  21*  GLUCOSE 160* 136* 82 80 173*  BUN 16 15 14 11 11   CREATININE 0.39* 0.37* 0.32* <0.30* <0.30*  CALCIUM 8.4* 8.3* 8.1* 7.8* 8.0*   GFR: CrCl cannot be calculated (This lab value cannot be used to calculate CrCl because it is not a number: <0.30). Liver Function Tests: No results for input(s): AST, ALT, ALKPHOS, BILITOT, PROT, ALBUMIN in the last 168 hours. No results for input(s): LIPASE, AMYLASE in the last 168 hours. No results for input(s): AMMONIA in the last 168 hours. Coagulation Profile: No results for input(s): INR, PROTIME in the last 168 hours. Cardiac Enzymes: No results for input(s): CKTOTAL, CKMB, CKMBINDEX, TROPONINI in the last 168 hours. BNP (last 3 results) No results for input(s): PROBNP in the last 8760 hours. HbA1C: No results for input(s): HGBA1C in the last 72 hours. CBG: Recent Labs  Lab 09/18/18 1701 09/18/18 2055 09/19/18 0426 09/19/18 0749 09/19/18 1140  GLUCAP 277* 179* 157* 138* 146*   Lipid Profile: No results for input(s): CHOL, HDL, LDLCALC, TRIG, CHOLHDL, LDLDIRECT in the last 72 hours. Thyroid Function Tests: No results for input(s): TSH, T4TOTAL, FREET4, T3FREE, THYROIDAB in the last 72 hours. Anemia Panel: No results for input(s): VITAMINB12, FOLATE, FERRITIN, TIBC, IRON, RETICCTPCT in the last 72 hours. Sepsis Labs: No results for input(s): PROCALCITON, LATICACIDVEN in the last 168 hours.  No results found for this or any previous visit (from the past 240 hour(s)).   Radiology Studies: Dg Chest Port 1 View  Result Date: 09/18/2018 CLINICAL DATA:  Shortness of Breath EXAM: PORTABLE CHEST 1  VIEW COMPARISON:  09/10/2018 FINDINGS: Cardiac shadow is stable. Right chest wall port is again seen and stable. New patchy infiltrate is noted in the right lung base when compared with the prior exam. Some scattered opacities are noted in the left lung base relatively stable from the prior  exam. No sizable effusion is seen. No bony abnormality is noted. Aortic calcifications are again seen. IMPRESSION: Bilateral basilar infiltrates right greater than left. The changes on the right are new from the prior exam. Electronically Signed   By: Inez Catalina M.D.   On: 09/18/2018 09:26    Scheduled Meds: . bisacodyl  10 mg Rectal Once  . chlorhexidine  15 mL Mouth Rinse BID  . Chlorhexidine Gluconate Cloth  6 each Topical Daily  . chlorpheniramine-HYDROcodone  5 mL Oral Once  . gabapentin  100 mg Oral BID  . gabapentin  300 mg Oral QHS  . insulin aspart  0-15 Units Subcutaneous TID WC  . insulin glargine  28 Units Subcutaneous QHS  . mouth rinse  15 mL Mouth Rinse q12n4p  . multivitamin with minerals  1 tablet Oral Daily  . nystatin  5 mL Oral QID  . pantoprazole  40 mg Oral Daily  . polyvinyl alcohol  1 drop Both Eyes TID  . predniSONE  50 mg Oral Q breakfast  . protein supplement shake  11 oz Oral TID BM  . sodium chloride flush  3 mL Intravenous Q12H  . sodium chloride  1 g Oral TID WC  . tamsulosin  0.4 mg Oral QPC breakfast   Continuous Infusions: . sodium chloride Stopped (08/30/18 0808)  . sodium chloride 10 mL/hr at 09/14/18 0600  . sodium chloride 75 mL/hr at 09/19/18 1400     LOS: 22 days   Marylu Lund, MD [facility] Pager On Amion  If 7PM-7AM, please contact night-coverage 09/19/2018, 2:55 PM

## 2018-09-19 NOTE — Progress Notes (Signed)
   09/19/18 1600  Clinical Encounter Type  Visited With Patient and family together  Visit Type Follow-up  Referral From Chaplain  Consult/Referral To Chaplain  Spiritual Encounters  Spiritual Needs Prayer;Grief support  Chaplain quietly entered Pt. Room. Pt. was awake and conversational.   Pt. daughter affirmed previously meeting the chaplain with her father.  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 The Pt. openly asked for prayer with the chaplain and finished with prayer for the chaplain.  The chaplain exited with the Pt. Daughter.  The daughter explained to the chaplain the plans for family to visit tonight and the Pt. hope to peacefully die at the hospital without a transition to [place]. Chaplain affirmed the Pt. and daughter's courage in their faithful conversations. The daughter returned the chaplain's statement with a smile. The daughter asked for resources on grieving, mentioning "Gone From My Sight" by name. The chaplain reached out to her peers for resources available on the [place].  The daughter ended the spiritual care visit with a brief conversation about her personal question's of faith and finding a faith family.  The daughter thanked the chaplain for the visit.

## 2018-09-20 LAB — GLUCOSE, CAPILLARY
GLUCOSE-CAPILLARY: 146 mg/dL — AB (ref 70–99)
GLUCOSE-CAPILLARY: 211 mg/dL — AB (ref 70–99)
GLUCOSE-CAPILLARY: 244 mg/dL — AB (ref 70–99)
Glucose-Capillary: 70 mg/dL (ref 70–99)
Glucose-Capillary: 158 mg/dL — ABNORMAL HIGH (ref 70–99)
Glucose-Capillary: 134 mg/dL — ABNORMAL HIGH (ref 70–99)

## 2018-09-20 MED ORDER — FUROSEMIDE 10 MG/ML IJ SOLN
60.0000 mg | Freq: Once | INTRAMUSCULAR | Status: AC
  Administered 2018-09-20: 60 mg via INTRAVENOUS
  Filled 2018-09-20: qty 6

## 2018-09-20 NOTE — Progress Notes (Signed)
2:35PM NT notified RN of patient oxygen sat in 80s on 10L HFNC, in to assess patient, respirations shallow, encouraged to take deep breaths, attempted incentive spirometer. Respiratory provided assistance with breathing tx and nonrebreather. Pt stating 100%. Dr. Wyline Copas gave order for IV lasix and in to assess patient. Wife at bedside during encounter.

## 2018-09-20 NOTE — Progress Notes (Signed)
PROGRESS NOTE    Allen Glenn  VEL:[id number] DOB: 1956/04/11 DOA: 08/28/2018 PCP: [name], MD    Brief Narrative:  62 year old male with hypertension, coronary artery disease, diabetes mellitus, metastatic colon cancer on chemotherapy who presented to the hospital for fever of 102 degrees, full oxygen level and fast heart rate.  Noted to be febrile and hypoxic in the ED and was found to have an elevated lactic acid.  He was admitted for sepsis suspected to be due to pneumonia.   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[place])   Hypertension   Anemia   DNR (do not resuscitate) discussion   Weakness generalized   Pancytopenia ([place])   Fever   Sepsis ([place])   Chemotherapy-induced neutropenia (HCC)   Electrolyte imbalance   Pneumonia   Atherosclerosis of aorta (HCC)   Hypoxia   Hyponatremia   Hyperkalemia   SOB (shortness of breath)   Protein-calorie malnutrition, severe  Principal Problem:   Acute respiratory failure with hypoxia, worsening - suspected to be bilateral pneumonia progressing to ARDS - the patient was initially maintained on a BiPAP  -CT chest performed 9/7 shows extensive opacities throughout both lungs. Small bilateral pleural effusion and bilateral lung nodule compatible with metastatic disease. - - ID had been assisting with management of antibiotics-  Levofloxacin stopped 9/17- Vancomycin stopped on 9/18 - Bactrim now stopped - CXR reviewed, findings worrisome for new bilateral infiltrates - Met with family. Shared concerns that despite aggressive care,  patient's condition has declined rapidly overnight. Anticipate prognosis is less than 2 weeks.  -Patient is alert, oriented, conversant. States he is aware of his worsening prognosis and that his goal is to pass peacefully in his sleep, ideally soon, and that he has lived a good life with a wonderful family -This afternoon, patient noted to have worsening O2 requirements up to 15L and NRB -Given 30m IV lasix, placed condom cath. Patient reports "bad experience" with foley cath in past -LE edema noted. Have d/c IVF -Cont to monitor. May need additional dose of lasix  Active Problems:   Colon cancer s/p right colectomy with metastasis to the lungs, bone, liver and brain - above episode of respiratory failure occurred after the patient received his first chemotherapy -For now chemotherapy has been on hold as respiratory issues are being treated -Had been followed by Dr. SBenay Spicewho tells me that he is no longer a candidate for further chemotherapy -Per above, pending [facility] bed today  Dysphagia - Currently on D 1 diet due to being a high aspiration risk-   advanced to mechanical soft as he is craving more solid food- patient and family understand aspiration risk. Continuing comfort feeds as patient desires  Hypotension with h/o HTN (hypertension) - Lisinopril remains on hold at this time   Insulin dependent type 2 diabetes mellitus, uncontrolled  - cont ISS AC/HS and Lantus - Last A1c 7.9.  - sugars high due to steroids -weaning insulin as steroids are being weaned. Stable at this time   Hyponatremia - d/c Lasix on 9/18 as he  appeared dehydrated    - has grade 1 d CHF and was not on Lasix at home - sodium has dropped down to 126-127 range   - ? If this is related to Bactrim- he has been receiving about 1.5 L of infusions with Bactrim daily as well - have switched to oral Bactrim -Labs reviewed. Sodium normalized. Further IVF held   Hyperkalemia - ongoing hyperkalemia-  possibly due to Bactrim  -was earlier replaced    Normocytic anemia - likely AOCD -Had remained hemodynamically stable currently  Thrombocytopenia - noted to have a drop in platelets on lab work on 7/22 and has remained low since- - Platelets have been stable at 60-90 range -No evidence of blood loss  Severe deconditioning - needs 2 + assistance to get out of bed and can barely finish a solid meal - Dispo plans for [employer] remains pending  Severe fatigue/ sedated - recently changed bedtime Ativan to PRN- weaned down Neurontin -Mentation appears at baseline this AM  DVT prophylaxis: SCD's Code Status: DNR Family Communication: Pt in room, family at bedside Disposition Plan: [facility], pending bed availability  Consultants:   Palliative Care  ID  Procedures:     Antimicrobials: Anti-infectives (From admission, onward)   Start     Dose/Rate Route Frequency Ordered Stop   09/12/18 2200  sulfamethoxazole-trimethoprim (BACTRIM DS,SEPTRA DS) 800-160 MG per tablet 2 tablet     2 tablet Oral Every 8 hours 09/12/18 1224 09/15/18 1247   09/05/18 2200  vancomycin (VANCOCIN) IVPB 1000 mg/200 mL premix  Status:  Discontinued     1,000 mg 200 mL/hr over 60 Minutes Intravenous Every 12 hours 09/05/18 1353 09/10/18 1410   09/01/18 1800  sulfamethoxazole-trimethoprim (BACTRIM) 420 mg in dextrose 5 % 500 mL IVPB  Status:  Discontinued     420 mg 350.8 mL/hr over 90 Minutes Intravenous Every 8 hours 09/01/18 1529 09/12/18 1345   09/01/18 1000  fluconazole (DIFLUCAN) IVPB 400 mg  Status:  Discontinued     400 mg 100 mL/hr over 120 Minutes Intravenous Every 24 hours 09/01/18 0838 09/09/18 1512   08/31/18 2200  vancomycin (VANCOCIN) 1,250 mg in sodium chloride 0.9 % 250 mL IVPB  Status:  Discontinued     1,250 mg 166.7 mL/hr over 90 Minutes Intravenous Every 12 hours 08/31/18 0852 09/05/18 1353   08/31/18 1000  meropenem (MERREM) 1 g in sodium chloride 0.9 % 100 mL IVPB   Status:  Discontinued     1 g 200 mL/hr over 30 Minutes Intravenous Every 8 hours 08/31/18 0848 09/01/18 1546   08/31/18 1000  vancomycin (VANCOCIN) 1,750 mg in sodium chloride 0.9 % 500 mL IVPB     1,750 mg 250 mL/hr over 120 Minutes Intravenous  Once 08/31/18 0852 08/31/18 1146   08/29/18 1400  levofloxacin (LEVAQUIN) IVPB 750 mg     750 mg 100 mL/hr over 90 Minutes Intravenous Every 24 hours 08/29/18 1353 09/11/18 1400   08/28/18 2000  vancomycin (VANCOCIN) 1,250 mg in sodium chloride 0.9 % 250 mL IVPB  Status:  Discontinued     1,250 mg 166.7 mL/hr over 90 Minutes Intravenous Every 12 hours 08/28/18 1725 08/30/18 0853   08/28/18 1500  piperacillin-tazobactam (ZOSYN) IVPB 3.375 g  Status:  Discontinued     3.375 g 12.5 mL/hr over 240 Minutes Intravenous Every 8 hours 08/28/18 1245 08/31/18 0844   08/28/18 0900  vancomycin (VANCOCIN) 1,500 mg in sodium chloride 0.9 % 500 mL IVPB  1,500 mg 250 mL/hr over 120 Minutes Intravenous  Once 08/28/18 0854 08/28/18 1218   08/28/18 0900  piperacillin-tazobactam (ZOSYN) IVPB 3.375 g     3.375 g 100 mL/hr over 30 Minutes Intravenous  Once 08/28/18 0854 08/28/18 0935      Subjective: More short of breath today  Objective: Vitals:   09/19/18 2020 09/20/18 0409 09/20/18 1417 09/20/18 1428  BP: (!) 126/59 (!) 116/59 123/62   Pulse: (!) 115 94 (!) 113   Resp: _0 Temp: 99 F (37.2 C) (!) 97.4 F (36.3 C) 98.3 F (36.8 C)   TempSrc: Oral Oral Oral   SpO2: 95% 90% (!) 84% 92%  Weight:      Height:        Intake/Output Summary (Last 24 hours) at 09/20/2018 1637 Last data filed at 09/20/2018 1533 Gross per 24 hour  Intake 1909.13 ml  Output 600 ml  Net 1309.13 ml   Filed Weights   09/17/18 0409 09/18/18 0429 09/19/18 0627  Weight: 76.9 kg 80.3 kg 80 kg    Examination: General exam: Awake, laying in bed, appears more sob Respiratory system: Increased resp effort, no wheezing Cardiovascular system: regular rate, s1,  s2 Gastrointestinal system: Soft, nondistended, positive BS Central nervous system: CN2-12 grossly intact, strength intact Extremities: Perfused, no clubbing Skin: Normal skin turgor, no notable skin lesions seen Psychiatry: Mood normal // no visual hallucinations   Data Reviewed: I have personally reviewed following labs and imaging studies  CBC: No results for input(s): WBC, NEUTROABS, HGB, HCT, MCV, PLT in the last 168 hours. Basic Metabolic Panel: Recent Labs  Lab 09/14/18 0447 09/14/18 1627 09/15/18 0320 09/16/18 0623 09/17/18 0427  NA 127* 129* 131* 135 136  K 4.9 4.4 4.1 3.6 3.4*  CL 92* 96* 98 103 107  CO2 _1 21*  GLUCOSE 160* 136* 82 80 173*  BUN _2 CREATININE 0.39* 0.37* 0.32* <0.30* <0.30*  CALCIUM 8.4* 8.3* 8.1* 7.8* 8.0*   GFR: CrCl cannot be calculated (This lab value cannot be used to calculate CrCl because it is not a number: <0.30). Liver Function Tests: No results for input(s): AST, ALT, ALKPHOS, BILITOT, PROT, ALBUMIN in the last 168 hours. No results for input(s): LIPASE, AMYLASE in the last 168 hours. No results for input(s): AMMONIA in the last 168 hours. Coagulation Profile: No results for input(s): INR, PROTIME in the last 168 hours. Cardiac Enzymes: No results for input(s): CKTOTAL, CKMB, CKMBINDEX, TROPONINI in the last 168 hours. BNP (last 3 results) No results for input(s): PROBNP in the last 8760 hours. HbA1C: No results for input(s): HGBA1C in the last 72 hours. CBG: Recent Labs  Lab 09/19/18 2023 09/19/18 2325 09/20/18 0412 09/20/18 0744 09/20/18 1150  GLUCAP 278* 178* 70 158* 134*   Lipid Profile: No results for input(s): CHOL, HDL, LDLCALC, TRIG, CHOLHDL, LDLDIRECT in the last 72 hours. Thyroid Function Tests: No results for input(s): TSH, T4TOTAL, FREET4, T3FREE, THYROIDAB in the last 72 hours. Anemia Panel: No results for input(s): VITAMINB12, FOLATE, FERRITIN, TIBC, IRON, RETICCTPCT in the last 72  hours. Sepsis Labs: No results for input(s): PROCALCITON, LATICACIDVEN in the last 168 hours.  No results found for this or any previous visit (from the past 240 hour(s)).   Radiology Studies: No results found.  Scheduled Meds: . bisacodyl  10 mg Rectal Once  . chlorhexidine  15 mL Mouth Rinse BID  . Chlorhexidine Gluconate Cloth  6 each Topical  Daily  . chlorpheniramine-HYDROcodone  5 mL Oral Once  . gabapentin  100 mg Oral BID  . gabapentin  300 mg Oral QHS  . insulin aspart  0-15 Units Subcutaneous TID WC  . insulin glargine  28 Units Subcutaneous QHS  . mouth rinse  15 mL Mouth Rinse q12n4p  . multivitamin with minerals  1 tablet Oral Daily  . nystatin  5 mL Oral QID  . pantoprazole  40 mg Oral Daily  . polyvinyl alcohol  1 drop Both Eyes TID  . predniSONE  50 mg Oral Q breakfast  . protein supplement shake  11 oz Oral TID BM  . sodium chloride flush  3 mL Intravenous Q12H  . sodium chloride  1 g Oral TID WC  . tamsulosin  0.4 mg Oral QPC breakfast   Continuous Infusions: . sodium chloride Stopped (08/30/18 0808)  . sodium chloride 1,000 mL (09/20/18 1534)     LOS: 23 days   Marylu Lund, MD [facility] Pager On Amion  If 7PM-7AM, please contact night-coverage 09/20/2018, 4:37 PM

## 2018-09-21 DIAGNOSIS — R0600 Dyspnea, unspecified: Secondary | ICD-10-CM

## 2018-09-21 LAB — GLUCOSE, CAPILLARY
GLUCOSE-CAPILLARY: 54 mg/dL — AB (ref 70–99)
GLUCOSE-CAPILLARY: 208 mg/dL — AB (ref 70–99)
Glucose-Capillary: 93 mg/dL (ref 70–99)
Glucose-Capillary: 90 mg/dL (ref 70–99)
Glucose-Capillary: 227 mg/dL — ABNORMAL HIGH (ref 70–99)
Glucose-Capillary: 216 mg/dL — ABNORMAL HIGH (ref 70–99)
Glucose-Capillary: 318 mg/dL — ABNORMAL HIGH (ref 70–99)

## 2018-09-21 MED ORDER — FUROSEMIDE 10 MG/ML IJ SOLN
60.0000 mg | Freq: Once | INTRAMUSCULAR | Status: AC
  Administered 2018-09-21: 60 mg via INTRAVENOUS
  Filled 2018-09-21: qty 6

## 2018-09-21 MED ORDER — SALINE SPRAY 0.65 % NA SOLN
1.0000 | NASAL | Status: DC | PRN
  Administered 2018-09-21: 1 via NASAL
  Filled 2018-09-21: qty 44

## 2018-09-21 MED ORDER — INSULIN GLARGINE 100 UNIT/ML ~~LOC~~ SOLN
20.0000 [IU] | Freq: Every day | SUBCUTANEOUS | Status: DC
  Administered 2018-09-21: 20 [IU] via SUBCUTANEOUS
  Filled 2018-09-21 (×2): qty 0.2

## 2018-09-21 NOTE — Progress Notes (Signed)
Palliative Care Progress Note  Reason for consult: Goals of care, symptom management (dyspnea)  I saw and examined Allen Glenn today.  Multiple family members present in room.   He was awake and alert and able to converse wearing [place] and nonrebreather was around neck as well.  He denies needs other than, "a miracle."  Reports not currently feeling SOB and that currently ordered dose of morphine is effective when he needs it.  Discussed with Dr. Wyline Copas and working to see if can improve hypoxemia with further diuresis.  If not, I am concerned if he will be stable enough to transport to [facility] when bed becomes available.  Answered family questions to the best of my ability.  Total time: 15 minutes Greater than 50%  of this time was spent counseling and coordinating care related to the above assessment and plan.  Micheline Rough, MD [facility] [place] [facility] [phone]

## 2018-09-21 NOTE — Progress Notes (Signed)
PROGRESS NOTE    Allen Glenn  RKY:[id number] DOB: [birth year] DOA: 08/28/2018 PCP: [name], MD    Brief Narrative:  62 year old male with hypertension, coronary artery disease, diabetes mellitus, metastatic colon cancer on chemotherapy who presented to the hospital for fever of 102 degrees, full oxygen level and fast heart rate.  Noted to be febrile and hypoxic in the ED and was found to have an elevated lactic acid.  He was admitted for sepsis suspected to be due to pneumonia.   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[place])   Hypertension   Anemia   DNR (do not resuscitate) discussion   Weakness generalized   Pancytopenia ([place])   Fever   Sepsis ([place])   Chemotherapy-induced neutropenia (HCC)   Electrolyte imbalance   Pneumonia   Atherosclerosis of aorta (HCC)   Hypoxia   Hyponatremia   Hyperkalemia   SOB (shortness of breath)   Protein-calorie malnutrition, severe  Principal Problem:   Acute respiratory failure with hypoxia, worsening - suspected to be bilateral pneumonia progressing to ARDS - the patient was initially maintained on a BiPAP  -CT chest performed 9/7 shows extensive opacities throughout both lungs. Small bilateral pleural effusion and bilateral lung nodule compatible with metastatic disease. - - ID had been assisting with management of antibiotics-  Levofloxacin stopped 9/17- Vancomycin stopped on 9/18 - Bactrim now stopped - CXR reviewed, findings worrisome for new bilateral infiltrates - Met with family. Shared concerns that despite aggressive care,  patient's condition has declined rapidly overnight. Anticipate prognosis is less than 2 weeks.  -Patient is alert, oriented, conversant. States he is aware of his worsening prognosis and that his goal is to pass peacefully in his sleep, ideally soon, and that he has lived a good life with a wonderful family -This afternoon, patient noted to have worsening O2 requirements up to 15L and NRB -Given 14m IV lasix yesterday. Pt noted to be on [place] afterwards -Still hypoxemic. Will give trial of another dose of 650mIV lasix  Active Problems:   Colon cancer s/p right colectomy with metastasis to the lungs, bone, liver and brain - above episode of respiratory failure occurred after the patient received his first chemotherapy -For now chemotherapy has been on hold as respiratory issues are being treated -Had been followed by Dr. ShBenay Spiceho tells me that he is no longer a candidate for further chemotherapy -Per above, [facility]  Dysphagia - Currently on D 1 diet due to being a high aspiration risk-   advanced to mechanical soft as he is craving more solid food- patient and family understand aspiration risk. Continuing comfort feeds as patient desires  Hypotension with h/o HTN (hypertension) - Lisinopril remains on hold at this time   Insulin dependent type 2 diabetes mellitus, uncontrolled  - cont ISS AC/HS and Lantus - Last A1c 7.9.  - sugars high due to steroids -weaning insulin as steroids are being weaned. Stable at this time   Hyponatremia - d/c Lasix on 9/18 as he appeared dehydrated    - has  grade 1 d CHF and was not on Lasix at home - sodium has dropped down to 126-127 range   - ? If this is related to Bactrim- he has been receiving about 1.5 L of infusions with Bactrim daily as well - have switched to oral Bactrim -Labs reviewed. Sodium normalized. Further IVF held   Hyperkalemia - ongoing hyperkalemia- possibly due to Bactrim  -was earlier  replaced    Normocytic anemia - likely AOCD -Had remained hemodynamically stable currently  Thrombocytopenia - noted to have a drop in platelets on lab work on 7/22 and has remained low since- - Platelets have been stable at 60-90 range -No evidence of blood loss  Severe deconditioning - needs 2 + assistance to get out of bed and can barely finish a solid meal - Dispo plans for [employer] remains pending  Severe fatigue/ sedated - recently changed bedtime Ativan to PRN- weaned down Neurontin -Mentation appears at baseline this AM  DVT prophylaxis: SCD's Code Status: DNR Family Communication: Pt in room, family at bedside Disposition Plan: [employer], pending bed availability  Consultants:   Palliative Care  ID  Procedures:     Antimicrobials: Anti-infectives (From admission, onward)   Start     Dose/Rate Route Frequency Ordered Stop   09/12/18 2200  sulfamethoxazole-trimethoprim (BACTRIM DS,SEPTRA DS) 800-160 MG per tablet 2 tablet     2 tablet Oral Every 8 hours 09/12/18 1224 09/15/18 1247   09/05/18 2200  vancomycin (VANCOCIN) IVPB 1000 mg/200 mL premix  Status:  Discontinued     1,000 mg 200 mL/hr over 60 Minutes Intravenous Every 12 hours 09/05/18 1353 09/10/18 1410   09/01/18 1800  sulfamethoxazole-trimethoprim (BACTRIM) 420 mg in dextrose 5 % 500 mL IVPB  Status:  Discontinued     420 mg 350.8 mL/hr over 90 Minutes Intravenous Every 8 hours 09/01/18 1529 09/12/18 1345   09/01/18 1000  fluconazole (DIFLUCAN) IVPB 400 mg  Status:  Discontinued     400 mg 100 mL/hr over 120 Minutes Intravenous Every 24 hours 09/01/18 0838 09/09/18 1512   08/31/18 2200  vancomycin (VANCOCIN) 1,250 mg in sodium chloride 0.9 % 250 mL IVPB  Status:  Discontinued     1,250 mg 166.7 mL/hr over 90 Minutes Intravenous Every 12 hours 08/31/18 0852 09/05/18 1353   08/31/18 1000  meropenem (MERREM) 1 g in sodium chloride 0.9 % 100 mL IVPB  Status:  Discontinued     1 g 200 mL/hr  over 30 Minutes Intravenous Every 8 hours 08/31/18 0848 09/01/18 1546   08/31/18 1000  vancomycin (VANCOCIN) 1,750 mg in sodium chloride 0.9 % 500 mL IVPB     1,750 mg 250 mL/hr over 120 Minutes Intravenous  Once 08/31/18 0852 08/31/18 1146   08/29/18 1400  levofloxacin (LEVAQUIN) IVPB 750 mg     750 mg 100 mL/hr over 90 Minutes Intravenous Every 24 hours 08/29/18 1353 09/11/18 1400   08/28/18 2000  vancomycin (VANCOCIN) 1,250 mg in sodium chloride 0.9 % 250 mL IVPB  Status:  Discontinued     1,250 mg 166.7 mL/hr over 90 Minutes Intravenous Every 12 hours 08/28/18 1725 08/30/18 0853   08/28/18 1500  piperacillin-tazobactam (ZOSYN) IVPB 3.375 g  Status:  Discontinued     3.375 g 12.5 mL/hr over 240 Minutes Intravenous Every 8 hours 08/28/18 1245 08/31/18 0844   08/28/18 0900  vancomycin (VANCOCIN) 1,500 mg in sodium chloride 0.9 % 500 mL IVPB     1,500 mg 250 mL/hr over 120 Minutes  Intravenous  Once 08/28/18 0854 08/28/18 1218   08/28/18 0900  piperacillin-tazobactam (ZOSYN) IVPB 3.375 g     3.375 g 100 mL/hr over 30 Minutes Intravenous  Once 08/28/18 0854 08/28/18 0935      Subjective: With more sob today  Objective: Vitals:   09/21/18 0457 09/21/18 0458 09/21/18 1008 09/21/18 1330  BP:   139/65 (!) 153/78  Pulse:   (!) 119 (!) 113  Resp:  17  20  Temp:    99.1 F (37.3 C)  TempSrc:    Oral  SpO2: (!) 87% 95%  99%  Weight:      Height:        Intake/Output Summary (Last 24 hours) at 09/21/2018 1531 Last data filed at 09/21/2018 0803 Gross per 24 hour  Intake 298.07 ml  Output 1875 ml  Net -1576.93 ml   Filed Weights   09/17/18 0409 09/18/18 0429 09/19/18 0627  Weight: 76.9 kg 80.3 kg 80 kg    Examination: General exam: Conversant, in no acute distress Respiratory system: normal chest rise, clear, no audible wheezing  Data Reviewed: I have personally reviewed following labs and imaging studies  CBC: No results for input(s): WBC, NEUTROABS, HGB, HCT, MCV, PLT in  the last 168 hours. Basic Metabolic Panel: Recent Labs  Lab 09/14/18 1627 09/15/18 0320 09/16/18 0623 09/17/18 0427  NA 129* 131* 135 136  K 4.4 4.1 3.6 3.4*  CL 96* 98 103 107  CO2 22 22 22  21*  GLUCOSE 136* 82 80 173*  BUN 15 14 11 11   CREATININE 0.37* 0.32* <0.30* <0.30*  CALCIUM 8.3* 8.1* 7.8* 8.0*   GFR: CrCl cannot be calculated (This lab value cannot be used to calculate CrCl because it is not a number: <0.30). Liver Function Tests: No results for input(s): AST, ALT, ALKPHOS, BILITOT, PROT, ALBUMIN in the last 168 hours. No results for input(s): LIPASE, AMYLASE in the last 168 hours. No results for input(s): AMMONIA in the last 168 hours. Coagulation Profile: No results for input(s): INR, PROTIME in the last 168 hours. Cardiac Enzymes: No results for input(s): CKTOTAL, CKMB, CKMBINDEX, TROPONINI in the last 168 hours. BNP (last 3 results) No results for input(s): PROBNP in the last 8760 hours. HbA1C: No results for input(s): HGBA1C in the last 72 hours. CBG: Recent Labs  Lab 09/21/18 0448 09/21/18 0519 09/21/18 0747 09/21/18 1125 09/21/18 1331  GLUCAP 54* 93 90 227* 208*   Lipid Profile: No results for input(s): CHOL, HDL, LDLCALC, TRIG, CHOLHDL, LDLDIRECT in the last 72 hours. Thyroid Function Tests: No results for input(s): TSH, T4TOTAL, FREET4, T3FREE, THYROIDAB in the last 72 hours. Anemia Panel: No results for input(s): VITAMINB12, FOLATE, FERRITIN, TIBC, IRON, RETICCTPCT in the last 72 hours. Sepsis Labs: No results for input(s): PROCALCITON, LATICACIDVEN in the last 168 hours.  No results found for this or any previous visit (from the past 240 hour(s)).   Radiology Studies: No results found.  Scheduled Meds: . bisacodyl  10 mg Rectal Once  . chlorhexidine  15 mL Mouth Rinse BID  . Chlorhexidine Gluconate Cloth  6 each Topical Daily  . chlorpheniramine-HYDROcodone  5 mL Oral Once  . furosemide  60 mg Intravenous Once  . gabapentin  100 mg Oral  BID  . gabapentin  300 mg Oral QHS  . insulin aspart  0-15 Units Subcutaneous TID WC  . insulin glargine  20 Units Subcutaneous QHS  . mouth rinse  15 mL Mouth Rinse q12n4p  . multivitamin with minerals  1 tablet Oral Daily  . nystatin  5 mL Oral QID  . pantoprazole  40 mg Oral Daily  . polyvinyl alcohol  1 drop Both Eyes TID  . predniSONE  50 mg Oral Q breakfast  . protein supplement shake  11 oz Oral TID BM  . sodium chloride flush  3 mL Intravenous Q12H  . sodium chloride  1 g Oral TID WC  . tamsulosin  0.4 mg Oral QPC breakfast   Continuous Infusions: . sodium chloride Stopped (08/30/18 0808)  . sodium chloride 10 mL/hr at 09/21/18 0600     LOS: 24 days   Marylu Lund, MD [facility] Pager On Amion  If 7PM-7AM, please contact night-coverage 09/21/2018, 3:31 PM

## 2018-09-21 NOTE — Progress Notes (Signed)
Inpatient Diabetes Program Recommendations  AACE/ADA: New Consensus Statement on Inpatient Glycemic Control (2015)  Target Ranges:  Prepandial:   less than 140 mg/dL      Peak postprandial:   less than 180 mg/dL (1-2 hours)      Critically ill patients:  140 - 180 mg/dL   Lab Results  Component Value Date   GLUCAP 90 09/21/2018   HGBA1C 7.9 (H) 05/30/2018    Review of Glycemic ControlResults for AMOUS, CREWE (MRN [id number]) as of 09/21/2018 08:42  Ref. Range 09/20/2018 23:42 09/21/2018 04:48 09/21/2018 05:19 09/21/2018 07:47  Glucose-Capillary Latest Ref Range: 70 - 99 mg/dL 146 (H) 54 (L) 93 90    Diabetes history: Type 2 DM Current orders for Inpatient glycemic control:  Lantus 28 units q HS, Novolog moderate tid with meals Prednisone 50 mg daily Inpatient Diabetes Program Recommendations:    Note low blood sugar this AM.  Please consider reducing Lantus to 20 units q HS.   Thanks,  Adah Perl, RN, BC-ADM Inpatient Diabetes Coordinator Pager [phone] (8a-5p)

## 2018-09-22 DIAGNOSIS — C799 Secondary malignant neoplasm of unspecified site: Secondary | ICD-10-CM

## 2018-09-22 LAB — GLUCOSE, CAPILLARY
GLUCOSE-CAPILLARY: 120 mg/dL — AB (ref 70–99)
GLUCOSE-CAPILLARY: 116 mg/dL — AB (ref 70–99)
GLUCOSE-CAPILLARY: 295 mg/dL — AB (ref 70–99)
Glucose-Capillary: 245 mg/dL — ABNORMAL HIGH (ref 70–99)

## 2018-09-22 MED ORDER — PANTOPRAZOLE SODIUM 40 MG PO TBEC: 40.0000 mg | DELAYED_RELEASE_TABLET | Freq: Every day | ORAL | Status: AC

## 2018-09-22 MED ORDER — PREDNISONE 50 MG PO TABS: ORAL_TABLET | ORAL | Status: AC

## 2018-09-22 MED ORDER — ONDANSETRON HCL 4 MG/2ML IJ SOLN: 4.0000 mg | Freq: Four times a day (QID) | INTRAMUSCULAR | 0 refills | Status: AC | PRN

## 2018-09-22 MED ORDER — POLYETHYLENE GLYCOL 3350 17 G PO PACK: 17.0000 g | PACK | Freq: Every day | ORAL | 0 refills | Status: AC | PRN

## 2018-09-22 MED ORDER — HYDROCOD POLST-CPM POLST ER 10-8 MG/5ML PO SUER: 5.0000 mL | Freq: Once | ORAL | 0 refills | Status: AC

## 2018-09-22 MED ORDER — MORPHINE SULFATE (PF) 2 MG/ML IV SOLN: 1.0000 mg | INTRAVENOUS | 0 refills | Status: AC | PRN

## 2018-09-22 MED ORDER — POLYVINYL ALCOHOL 1.4 % OP SOLN: 1.0000 [drp] | Freq: Three times a day (TID) | OPHTHALMIC | 0 refills | Status: AC

## 2018-09-22 MED ORDER — ONDANSETRON HCL 4 MG PO TABS: 4.0000 mg | ORAL_TABLET | Freq: Four times a day (QID) | ORAL | 0 refills | Status: AC | PRN

## 2018-09-22 MED ORDER — LORAZEPAM 2 MG/ML IJ SOLN: 0.5000 mg | Freq: Two times a day (BID) | INTRAMUSCULAR | 0 refills | Status: AC | PRN

## 2018-09-22 MED ORDER — MENTHOL 3 MG MT LOZG: 1.0000 | LOZENGE | OROMUCOSAL | 12 refills | Status: AC | PRN

## 2018-09-22 MED ORDER — NYSTATIN 100000 UNIT/ML MT SUSP: 5.0000 mL | Freq: Four times a day (QID) | OROMUCOSAL | 0 refills | Status: AC

## 2018-09-22 MED ORDER — METOCLOPRAMIDE HCL 5 MG/ML IJ SOLN: 10.0000 mg | Freq: Two times a day (BID) | INTRAMUSCULAR | 0 refills | Status: AC | PRN

## 2018-09-22 MED ORDER — TRAZODONE HCL 50 MG PO TABS: 50.0000 mg | ORAL_TABLET | Freq: Every evening | ORAL | Status: AC | PRN

## 2018-09-22 MED ORDER — MORPHINE SULFATE 15 MG PO TABS: 15.0000 mg | ORAL_TABLET | ORAL | 0 refills | Status: AC | PRN

## 2018-09-22 MED ORDER — LEVALBUTEROL HCL 1.25 MG/0.5ML IN NEBU: 1.2500 mg | INHALATION_SOLUTION | RESPIRATORY_TRACT | 12 refills | Status: AC | PRN

## 2018-09-22 MED ORDER — INSULIN ASPART 100 UNIT/ML ~~LOC~~ SOLN: 0.0000 [IU] | Freq: Three times a day (TID) | SUBCUTANEOUS | 11 refills | Status: AC

## 2018-09-22 MED ORDER — BISACODYL 10 MG RE SUPP: 10.0000 mg | Freq: Once | RECTAL | 0 refills | Status: AC

## 2018-09-22 MED ORDER — HYDROCOD POLST-CPM POLST ER 10-8 MG/5ML PO SUER: 5.0000 mL | Freq: Two times a day (BID) | ORAL | Status: AC | PRN

## 2018-09-22 MED ORDER — INSULIN GLARGINE 100 UNIT/ML ~~LOC~~ SOLN: 20.0000 [IU] | Freq: Every day | SUBCUTANEOUS | 11 refills | Status: AC

## 2018-09-22 MED ORDER — CALCIUM CARBONATE ANTACID 500 MG PO CHEW: 400.0000 mg | CHEWABLE_TABLET | Freq: Three times a day (TID) | ORAL | Status: AC | PRN

## 2018-09-22 NOTE — Progress Notes (Signed)
Physical Therapy Discharge Patient Details Name: Allen Glenn MRN: [id number] DOB: 03/05/1956 Today's Date: 09/22/2018 Time:  -     Patient discharged from PT services secondary to medical decline - Awaiying bed at [facility]. Please see latest therapy progress note for current level of functioning and progress toward goals.    [place] Pager [phone] Office [phone] GP     [name] 09/22/2018, 7:06 AM

## 2018-09-22 NOTE — Progress Notes (Signed)
[facility] Liaison note.  Received request from Stephanie Acre, [place] for patient/family interest in [facility] with request for transfer tdday. Chart reviewed and eligibly confirmed.  Met with patient and family to confirm interest and explain services. Family agreeable to transfer today. CSW aware.  Registration paper work completed.. Dr. [name]  to assume care per family request.  Please fax discharge summary to [phone]. RN please call report to [phone]. Please arrange transport for patient to transfer to [facility].  Thank you,   Farrel Gordon, RN, [facility] Liaison  [place] are on AMION

## 2018-09-22 NOTE — Care Management Note (Signed)
Case Management Note  Patient Details  Name: Allen Glenn MRN: [id number] Date of Birth: 04-Sep-1956  Subjective/Objective: d/c [facility]-CSW managing.                   Action/Plan:dc [facility]   Expected Discharge Date:  09/22/18               Expected Discharge Plan:  [place]  In-House Referral:  Clinical Social Work  Discharge planning Services  CM Consult  Post Acute Care Choice:    Choice offered to:     DME Arranged:    DME Agency:     HH Arranged:    [place] Agency:     Status of Service:  Completed, signed off  If discussed at [name], dates discussed:    Additional Comments:  Dessa Phi, RN 09/22/2018, 11:57 AM

## 2018-09-22 NOTE — Progress Notes (Signed)
   09/22/18 1425  Clinical Encounter Type  Visited With Patient and family together  Visit Type Follow-up  Referral From Chaplain  Consult/Referral To [place] chose to visit Pt., wife, and daughter while Pt. and family waiting for Pt. transition to [facility] Chaplain greeted family and was warmly welcomed into room. Pt. engaged chaplain in conversation about her weekend.  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 Pt. and family agreed on the role of trust as they transition to Hospice care. The Pt. and family accepted prayer from the chaplain. The chaplain noticed Pt. wife was very emotional during the prayer.  The Pt. and family thanked the chaplain for the visit and shared their desires to stay connected to Palliative Care team.

## 2018-09-22 NOTE — Progress Notes (Signed)
Pt discharged to [facility] via [facility], with 15L (non-rebreather mask during transportation and 15L high flow nasal canula at facility), and foley catheter, and right chest port-a-cath in place.

## 2018-09-22 NOTE — Progress Notes (Addendum)
[facility] is prepared to accept patient when patient is discharged.   Per palliative care note, patient and family agreeable to plan.  CSW will fax discharge summary to [facility] once patient is discharged and will continue to follow to coordinate discharge planning.  Discharge summary faxed. Bevely Palmer from [place] to meet with family to complete paperwork.  RN please call report to [phone].  [facility] called and will be able to transport after 3pm.   CSW signing off, please reconsult if additional social work needs arise.   Stephanie Acre, [place] Social Worker [phone]

## 2018-09-22 NOTE — Discharge Summary (Addendum)
Physician Discharge Summary  Allen Glenn JOA:[id number] DOB: Dec 17, 1956 DOA: 08/28/2018  PCP: [name], MD  Admit date: 08/28/2018 Discharge date: 09/22/2018  Admitted From: Home Disposition:  [place]  Recommendations for Outpatient Follow-up:  1. Follow up with Palliative Care at [facility] place 2. Recommend PRN lasix for sob  Discharge Condition:Stable CODE STATUS:DNR Diet recommendation: Comfort feeds   Brief/Interim Summary: 62 year old male with hypertension, coronary artery disease, diabetes mellitus, metastatic colon cancer on chemotherapy who presented to the hospital for fever of 102 degrees, full oxygen level and fast heart rate. Noted to be febrile and hypoxic in the ED and was found to have an elevated lactic acid. He was admitted for sepsis suspected to be due to pneumonia.   Principal Problem: Acute respiratory failure with hypoxia, worsening - suspected to be bilateral pneumonia progressing to ARDS - the patient was initially maintained on a BiPAP  -CT chest performed 9/7 shows extensive opacities throughout both lungs. Small bilateral pleural effusion and bilateral lung nodule compatible with metastatic disease. - - ID had been assisting with management of antibiotics- Levofloxacin stopped 9/17- Vancomycin stopped on 9/18 - Bactrim now stopped - CXR reviewed, findings worrisome for new bilateral infiltrates - Met with family. Shared concerns that despite aggressive care, patient's condition has declined rapidly overnight. Anticipate prognosis is less than 2 weeks.  -Patient is alert, oriented, conversant. States he is aware of his worsening prognosis and that his goal is to pass peacefully in his sleep, ideally soon, and that he has lived a good life with a wonderful family -This admit, patient noted to have worsening O2 requirements up to 15L and NRB -Patient seems to improve with PRN IV lasix, now down to 15L [place] -Recommend PRN IV lasix  Active  Problems: Colon cancer s/p right colectomy with metastasis to the lungs, bone, liver and brain - above episode of respiratory failure occurred after the patient received his first chemotherapy -For now chemotherapy has been on hold as respiratory issues are being treated -Had been followed by [name] who tells me that he is no longer a candidate for further chemotherapy -Per above, for [employer] today  Dysphagia - Currently on D 1 diet due to being a high aspiration risk- advanced to mechanical softas he is craving more solid food- patient and family understand aspiration risk. Continuing comfort feeds as patient desires  Hypotension with h/o HTN (hypertension) - Lisinopril remains on hold at this time  Insulin dependent type 2 diabetes mellitus, uncontrolled  - cont ISS AC/HS and Lantus - Last A1c 7.9.  - sugars were noted to be high due to steroids, improved on current insulin regimen (see med list) -continued on prednisone. Stable at this time  Hyponatremia - d/c Lasix on 9/18 as he appeared dehydrated  - has grade 1 d CHF and was not on Lasix at home - sodium has dropped down to 126-127 range - ? If this is related to Bactrim- he has been receiving about 1.5 L of infusions with Bactrim daily as well - have switched to oral Bactrim -Labs reviewed. Sodium normalized. Further IVF held  Hyperkalemia - ongoing hyperkalemia- possibly due to Bactrim  -was earlier replaced  Normocytic anemia - likely AOCD -Had remained hemodynamically stable currently  Thrombocytopenia - noted to have a drop in platelets on lab work on 7/22 and has remained low since- - Platelets have been stable at 60-90 range -No evidence of blood loss  Severe deconditioning - needs2 + assistanceto get out  of bed and can barely finish a solid meal - Dispo plans for [employer] remains pending  Severe fatigue/ sedated - recently changed bedtime Ativan to PRN- weaned down  Neurontin -Mentation appears at baseline this AM   Discharge Diagnoses:  Principal Problem:   Acute respiratory failure with hypoxia ([place]) Active Problems:   Colon cancer s/p right colectomy    Pulmonary metastasis (HCC)   Other chronic pancreatitis ([name])   Metastatic colon cancer to liver (HCC)   HTN (hypertension)   Brain metastases (HCC)   Insulin dependent type 2 diabetes mellitus, uncontrolled ([place])   Coronary artery disease   Hyperlipidemia   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[name])   Hypertension   Anemia   DNR (do not resuscitate) discussion   Weakness generalized   Pancytopenia ([place])   Fever   Sepsis ([place])   Chemotherapy-induced neutropenia (HCC)   Electrolyte imbalance   Pneumonia   Atherosclerosis of aorta (HCC)   Hypoxia   Hyponatremia   Hyperkalemia   SOB (shortness of breath)   Protein-calorie malnutrition, severe    Discharge Instructions   Allergies as of 09/22/2018   No Known Allergies     Medication List    STOP taking these medications   atorvastatin 10 MG tablet Commonly known as:  LIPITOR   clotrimazole 10 MG troche Commonly known as:  MYCELEX   CoQ10 200 MG Caps   dexamethasone 2 MG tablet Commonly known as:  DECADRON   diphenoxylate-atropine 2.5-0.025 MG tablet Commonly known as:  LOMOTIL   dronabinol 2.5 MG capsule Commonly known as:  MARINOL   FREESTYLE LIBRE 14 DAY SENSOR Misc   HUMALOG KWIKPEN 100 UNIT/ML KiwkPen Generic drug:  insulin lispro   Insulin Pen Needle 31G X 5 MM Misc   lidocaine-prilocaine cream Commonly known as:  EMLA   lisinopril 2.5 MG tablet Commonly known as:  PRINIVIL,ZESTRIL   LORazepam 0.5 MG tablet Commonly known as:  ATIVAN Replaced by:  LORazepam 2 MG/ML injection   methocarbamol 500 MG tablet Commonly known as:  ROBAXIN   metoCLOPramide 10 MG tablet Commonly known as:  REGLAN Replaced by:  metoCLOPramide 5 MG/ML injection   mirtazapine 7.5 MG tablet Commonly known as:   REMERON   morphine 30 MG 12 hr tablet Commonly known as:  MS CONTIN Replaced by:  morphine 2 MG/ML injection You also have another medication with the same name that you need to continue taking as instructed.   omeprazole 20 MG capsule Commonly known as:  PRILOSEC   ondansetron 4 MG disintegrating tablet Commonly known as:  ZOFRAN-ODT   prochlorperazine 5 MG tablet Commonly known as:  COMPAZINE   promethazine 12.5 MG tablet Commonly known as:  PHENERGAN   trolamine salicylate 10 % cream Commonly known as:  ASPERCREME     TAKE these medications   acetaminophen 500 MG tablet Commonly known as:  TYLENOL Take 500 mg by mouth every 6 (six) hours as needed (FOR PAIN/HEADACHES.).   bisacodyl 10 MG suppository Commonly known as:  DULCOLAX Place 1 suppository (10 mg total) rectally once for 1 dose.   calcium carbonate 500 MG chewable tablet Commonly known as:  TUMS - dosed in mg elemental calcium Chew 2 tablets (400 mg of elemental calcium total) by mouth 3 (three) times daily as needed for indigestion or heartburn.   chlorpheniramine-HYDROcodone 10-8 MG/5ML Suer Commonly known as:  TUSSIONEX Take 5 mLs by mouth once for 1 dose.   chlorpheniramine-HYDROcodone 10-8 MG/5ML Suer Commonly known as:  TUSSIONEX Take 5 mLs by mouth every 12 (twelve) hours as needed for cough.   gabapentin 300 MG capsule Commonly known as:  NEURONTIN Take 1 capsule (300 mg total) by mouth 3 (three) times daily. 1 to 2 PO QHS What changed:  additional instructions   insulin aspart 100 UNIT/ML injection Commonly known as:  novoLOG Inject 0-15 Units into the skin 3 (three) times daily with meals.   insulin glargine 100 UNIT/ML injection Commonly known as:  LANTUS Inject 0.2 mLs (20 Units total) into the skin at bedtime. What changed:  how much to take   levalbuterol 1.25 MG/0.5ML nebulizer solution Commonly known as:  XOPENEX Take 1.25 mg by nebulization every 4 (four) hours as needed for  wheezing or shortness of breath.   LORazepam 2 MG/ML injection Commonly known as:  ATIVAN Inject 0.25 mLs (0.5 mg total) into the vein 3 times/day as needed-between meals & bedtime for anxiety or sedation. Replaces:  LORazepam 0.5 MG tablet   magic mouthwash Soln Take 5 mLs by mouth 4 (four) times daily as needed for mouth pain. What changed:  when to take this   menthol-cetylpyridinium 3 MG lozenge Commonly known as:  CEPACOL Take 1 lozenge (3 mg total) by mouth as needed for sore throat.   metoCLOPramide 5 MG/ML injection Commonly known as:  REGLAN Inject 2 mLs (10 mg total) into the vein every 12 (twelve) hours as needed. Replaces:  metoCLOPramide 10 MG tablet   morphine 15 MG tablet Commonly known as:  MSIR Take 1 tablet (15 mg total) by mouth every 4 (four) hours as needed for moderate pain. What changed:    reasons to take this  Another medication with the same name was removed. Continue taking this medication, and follow the directions you see here.   morphine 2 MG/ML injection Inject 0.5 mLs (1 mg total) into the vein every hour as needed (or air hunger). What changed:  You were already taking a medication with the same name, and this prescription was added. Make sure you understand how and when to take each. Replaces:  morphine 30 MG 12 hr tablet   nitroGLYCERIN 0.4 MG SL tablet Commonly known as:  NITROSTAT Place 0.4 mg under the tongue every 5 (five) minutes as needed for chest pain. CALL 911 IF PAIN PERSISTS   nystatin 100000 UNIT/ML suspension Commonly known as:  MYCOSTATIN Take 5 mLs (500,000 Units total) by mouth 4 (four) times daily.   ondansetron 4 MG tablet Commonly known as:  ZOFRAN Take 1 tablet (4 mg total) by mouth every 6 (six) hours as needed for nausea (if reglan not effective).   ondansetron 4 MG/2ML Soln injection Commonly known as:  ZOFRAN Inject 2 mLs (4 mg total) into the vein every 6 (six) hours as needed for nausea.   pantoprazole 40  MG tablet Commonly known as:  PROTONIX Take 1 tablet (40 mg total) by mouth daily. Start taking on:  09/23/2018   polyethylene glycol packet Commonly known as:  MIRALAX / GLYCOLAX Take 17 g by mouth daily as needed for mild constipation.   polyvinyl alcohol 1.4 % ophthalmic solution Commonly known as:  LIQUIFILM TEARS Place 1 drop into both eyes 3 (three) times daily.   predniSONE 50 MG tablet Commonly known as:  DELTASONE 35m po daily Start taking on:  09/23/2018   tamsulosin 0.4 MG Caps capsule Commonly known as:  FLOMAX Take 0.4 mg by mouth daily after breakfast. 30 minutes after breakfast   traZODone 50  MG tablet Commonly known as:  DESYREL Take 1 tablet (50 mg total) by mouth at bedtime as needed for sleep.      Follow-up Information    Follow up with Palliative Care at [facility] Follow up.          No Known Allergies  Consultations:  Oncology  Palliative Care  ID  Procedures/Studies: Dg Chest 2 View  Result Date: 08/29/2018 CLINICAL DATA:  Shortness of breath, fever, sepsis. History of MI, hypertension, colonic malignancy. EXAM: CHEST - 2 VIEW COMPARISON:  Chest x-ray and chest CT scan of September 4th 2019 FINDINGS: The lungs are well-expanded. Patchy airspace opacities bilaterally have markedly increased in conspicuity. There is no pleural effusion. The heart is normal in size. The pulmonary vascularity is indistinct. There is calcification in the wall of the aortic arch. There is soft tissue fullness in the right paratracheal region which is stable. There is scleroses of the anterior aspect of the right third rib. IMPRESSION: Interval worsening of bilateral airspace opacities worrisome for pneumonia or pulmonary edema or less likely lymphangitic spread of malignancy. There are multiple pulmonary nodules and mediastinal and hilar lymphadenopathy which were well demonstrated on the August 27, 2018 chest CT scan. Thoracic aortic atherosclerosis. Electronically  Signed   By: David  Martinique M.D.   On: 08/29/2018 11:54   Dg Chest 2 View  Result Date: 08/27/2018 CLINICAL DATA:  62 year old with current history of metastatic colon cancer for which the patient is undergoing chemotherapy, presenting with two-day history of fatigue, generalized weakness and tachycardia. EXAM: CHEST - 2 VIEW COMPARISON:  Chest x-rays 07/14/2018, 12/06/2016 and earlier. CT chest 02/25/2018, 11/26/2017 and earlier. MRI thoracic spine 07/04/2018 is correlated. FINDINGS: Cardiac silhouette normal in size, unchanged. Thoracic aorta mildly atherosclerotic, unchanged. Hilar and mediastinal contours otherwise unremarkable. RIGHT jugular Port-A-Cath tip in the UPPER RIGHT atrium. Evolving post radiation changes involving the INFERIOR LEFT UPPER LOBE which now has the appearance of linear scarring. Atelectasis involving the RIGHT LOWER LOBE and RIGHT MIDDLE LOBE, with associated mild elevation of the RIGHT hemidiaphragm. No confluent airspace consolidation. Metastasis involving the RIGHT ANTERIOR fourth rib with a mixed lytic and sclerotic appearance, overlying the RIGHT mid lung and RIGHT lung base on the PA image. Mixed lytic and sclerotic metastases involving the mid and UPPER thoracic spine as noted on the recent MRI. IMPRESSION: 1. Mild atelectasis involving the RIGHT LOWER LOBE and RIGHT MIDDLE LOBE. No acute cardiopulmonary disease otherwise. 2. Involving post radiation changes involving the INFERIOR LEFT UPPER LOBE which now has the appearance of linear scarring. 3. Osseous metastatic disease involving the RIGHT ANTERIOR fourth rib and multiple mid and UPPER thoracic vertebrae. Electronically Signed   By: Evangeline Dakin M.D.   On: 08/27/2018 13:10   Ct Chest Wo Contrast  Result Date: 08/30/2018 CLINICAL DATA:  Fever, fatigue and tachycardia. Undergoing chemotherapy for metastatic colon cancer. EXAM: CT CHEST WITHOUT CONTRAST TECHNIQUE: Multidetector CT imaging of the chest was performed  following the standard protocol without IV contrast. COMPARISON:  Chest radiographs obtained yesterday. Chest CTA dated 08/27/2018. Abdomen and pelvis CT dated 02/25/2018. FINDINGS: Cardiovascular: Atheromatous calcifications, including the thoracic aorta. Coronary artery stent. Right jugular catheter tip in the right atrium. Mediastinum/Nodes: Mildly prominent AP window lymph node with a short axis diameter of 9 mm on image number 57 series 2, previously 8 mm. Normal appearing thyroid gland. Lungs/Pleura: Interval extensive patchy opacities throughout both lungs and increased right lower lobe atelectasis. Multiple small bilateral lung nodules are again  demonstrated. Small bilateral pleural effusions, increased on the right and new on the left. Upper Abdomen: Faint, oval low density mass in the anterior aspect of the medial segment of the left lobe of the liver, measuring 4.9 x 3.9 cm on image number 118 series 2. An enlarged lymph node or peritoneal mass anterior to that portion of the liver has not changed significantly, measuring 1.9 x 1.1 cm on image number 116 series 2. Musculoskeletal: No significant change in patchy sclerosis in the T1, T4, T5 and T6 vertebral bodies with paraspinal extension on the right at the T4-5 level. There is also patchy sclerosis with extension beyond the bone margins into the adjacent soft tissues involving the right 4th rib. IMPRESSION: 1. Interval extensive patchy opacities throughout both lungs, compatible with pneumonia. 2. Small bilateral pleural effusions, increased on the right and new on the left. 3. Stable small bilateral lung nodules compatible with metastatic disease. 4. Increased right lower lobe atelectasis. 5. Faint, oval low density mass in the anterior aspect of the medial segment of the left lobe of the liver, measuring 4.9 x 3.9 cm, compatible with a previously demonstrated metastasis. Other previously demonstrated liver metastases are not well visualized today  without intravenous contrast. 6. Stable enlarged lymph node or peritoneal metastasis anterior to the liver on the right. 7. No significant change in metastatic disease involving the T1, T4, T5 and T6 vertebral bodies with paraspinal extension on the right at the T4-5 level. 8. Similar appearing right 4th rib metastasis with extension beyond the rib margins. 9. Minimal increase in size of a probable metastatic AP window lymph node. 10. Calcified aortic atherosclerosis. Aortic Atherosclerosis (ICD10-I70.0). Electronically Signed   By: Claudie Revering M.D.   On: 08/30/2018 16:49   Ct Angio Chest Pe W Or Wo Contrast  Result Date: 08/27/2018 CLINICAL DATA:  62 year old male with metastatic colon cancer. Shortness of breath for 2 days. EXAM: CT ANGIOGRAPHY CHEST WITH CONTRAST TECHNIQUE: Multidetector CT imaging of the chest was performed using the standard protocol during bolus administration of intravenous contrast. Multiplanar CT image reconstructions and MIPs were obtained to evaluate the vascular anatomy. CONTRAST:  150m ISOVUE-370 IOPAMIDOL (ISOVUE-370) INJECTION 76% COMPARISON:  Restaging CT chest abdomen and pelvis 02/25/2018 and earlier. Thoracic and lumbar spine MRI 07/04/2018. FINDINGS: Cardiovascular: Good contrast bolus timing in the pulmonary arterial tree. Respiratory motion in the lower lobes. No focal filling defect identified in the pulmonary arteries to suggest acute pulmonary embolism. Calcified aortic atherosclerosis. Calcified coronary artery atherosclerosis and/or stent. Right chest porta cath is new since March. No cardiomegaly or pericardial effusion. Mediastinum/Nodes: No mediastinal lymphadenopathy, but there is nonspecific increasing left hilar soft tissue as seen on series 5, image 147, measuring up to 11 millimeters in thickness. The right hilum appears stable and negative. Negative thoracic inlet. Lungs/Pleura: Lower lung volumes.  The major airways are patent. Increased size and number of  numerous pulmonary nodules throughout both lungs. Nodules range from punctate to 8-9 millimeters diameter. Superimposed right greater than left costophrenic angle atelectasis. Trace right pleural effusion. Upper Abdomen: Hyperenhancing liver metastases demonstrated on 02/25/2018 are less apparent today, but likely related to contrast timing as with narrow liver windows a large anterior right liver mass now measures at least 6 centimeters diameter (approximately 3 centimeters previously). Negative visible spleen, pancreas, adrenal glands, kidneys and bowel in the upper abdomen. Musculoskeletal: New sclerotic thoracic vertebral lesions at T1 and T4 through T6 levels are compatible with osseous metastatic disease. Confluent vertebral body involvement T4  through T6 is associated with mild pathologic fracture of T5 and bulky right anterolateral extraosseous extension of tumor as demonstrated on series 5, image 97. No obvious epidural tumor or evidence of cord compression at that level. These levels appears similar to the 07/04/2018 MRI. Review of the MIP images confirms the above findings. IMPRESSION: 1.  Negative for acute pulmonary embolus. 2. Progression of metastatic disease since the CTs on 02/25/2018. Numerous bilateral pulmonary nodules, new left hilar lymphadenopathy, enlarged liver tumor, and also multifocal osseous metastatic disease (thoracic spine metastases appear similar to the MRI on 07/04/2018). Electronically Signed   By: Genevie Ann M.D.   On: 08/27/2018 16:15   Dg Chest Port 1 View  Result Date: 09/18/2018 CLINICAL DATA:  Shortness of Breath EXAM: PORTABLE CHEST 1 VIEW COMPARISON:  09/10/2018 FINDINGS: Cardiac shadow is stable. Right chest wall port is again seen and stable. New patchy infiltrate is noted in the right lung base when compared with the prior exam. Some scattered opacities are noted in the left lung base relatively stable from the prior exam. No sizable effusion is seen. No bony  abnormality is noted. Aortic calcifications are again seen. IMPRESSION: Bilateral basilar infiltrates right greater than left. The changes on the right are new from the prior exam. Electronically Signed   By: Inez Catalina M.D.   On: 09/18/2018 09:26   Dg Chest Port 1 View  Result Date: 09/10/2018 CLINICAL DATA:  Shortness of breath EXAM: PORTABLE CHEST 1 VIEW COMPARISON:  September 09, 2018 and September 08, 2018 FINDINGS: Port-A-Cath tip is at the cavoatrial junction. No pneumothorax. There is patchy atelectatic change in the lower lobes, stable on the left with slight clearing from the right base. Slight increased opacity in the right apex is probably due to patient rotation as opposed to an actual lesion in this area. Lungs elsewhere clear. Heart size and pulmonary vascular normal. No adenopathy. There is aortic atherosclerosis. No evident bone lesions. IMPRESSION: Bibasilar atelectasis, more on the left than on the right. There has been slight clearing from the right base with no change on the left. Slight increased opacity in the right apex region is probably due to rotation, although particular attention this area on subsequent evaluations is warranted. Stable cardiac silhouette. There is aortic atherosclerosis. No change in Port-A-Cath position. Aortic Atherosclerosis (ICD10-I70.0). Electronically Signed   By: Lowella Grip III M.D.   On: 09/10/2018 07:51   Dg Chest Port 1 View  Result Date: 09/09/2018 CLINICAL DATA:  Shortness of breath. EXAM: PORTABLE CHEST 1 VIEW COMPARISON:  Radiograph of September 08, 2018. FINDINGS: The heart size and mediastinal contours are within normal limits. No pneumothorax or pleural effusion is noted. Right internal jugular Port-A-Cath is noted with distal tip in expected position of cavoatrial junction. Stable right basilar atelectasis or infiltrate is noted. Stable left lingular subsegmental atelectasis or infiltrate is noted. The visualized skeletal structures are  unremarkable. IMPRESSION: Stable bilateral lung opacities as described above. Electronically Signed   By: Marijo Conception, M.D.   On: 09/09/2018 07:47   Dg Chest Port 1 View  Result Date: 09/08/2018 CLINICAL DATA:  Shortness of breath EXAM: PORTABLE CHEST 1 VIEW COMPARISON:  September 07, 2018 FINDINGS: Port-A-Cath tip is at the cavoatrial junction. No pneumothorax. There is patchy airspace opacity throughout the right lower lobe as well as to a lesser extent in the left lower lobe. Heart is upper normal in size with pulmonary vascularity normal. No adenopathy. There is aortic atherosclerosis. No bone  lesions. IMPRESSION: Central catheter as described without pneumothorax. Patchy infiltrate in both lower lobes, more on the right than on the left, essentially stable. Stable cardiac silhouette. There is aortic atherosclerosis. Aortic Atherosclerosis (ICD10-I70.0). Electronically Signed   By: Lowella Grip III M.D.   On: 09/08/2018 07:04   Dg Chest Port 1 View  Result Date: 09/07/2018 CLINICAL DATA:  The shortness of breath EXAM: PORTABLE CHEST 1 VIEW COMPARISON:  09/06/2018 FINDINGS: Cardiac shadow is stable. Right-sided chest wall port is again noted and stable. Patchy infiltrates are again identified in both lungs although mildly improved in the bases bilaterally. No bony abnormality is noted. IMPRESSION: Bilateral infiltrates with very mild improvement when compare with the previous day. Electronically Signed   By: Inez Catalina M.D.   On: 09/07/2018 07:03   Dg Chest Port 1 View  Result Date: 09/06/2018 CLINICAL DATA:  Shortness of breath EXAM: PORTABLE CHEST 1 VIEW COMPARISON:  09/05/2018 FINDINGS: Cardiac shadow is stable. Right chest wall port is again seen. Patchy infiltrates in the mid and lower lung fields bilaterally are again identified and stable. No sizable effusion is seen. No acute bony abnormality is noted. IMPRESSION: Stable bilateral infiltrates Electronically Signed   By: Inez Catalina  M.D.   On: 09/06/2018 07:20   Dg Chest Port 1 View  Result Date: 09/05/2018 CLINICAL DATA:  Shortness of Breath EXAM: PORTABLE CHEST 1 VIEW COMPARISON:  09/04/2018 FINDINGS: Right Port-A-Cath remains in place, unchanged. Bilateral airspace opacities slightly improved since prior study, particularly on the left. Continued patchy opacities in the mid and right lower lung and left lower lung. Mild cardiomegaly. No effusions or acute bony abnormality. IMPRESSION: Patchy mid and lower lung bilateral airspace opacities. Overall, airspace disease slightly improved since prior study. Electronically Signed   By: Rolm Baptise M.D.   On: 09/05/2018 09:24   Dg Chest Port 1 View  Result Date: 09/04/2018 CLINICAL DATA:  Hypoxia EXAM: PORTABLE CHEST 1 VIEW COMPARISON:  09/03/2018 FINDINGS: Right Port-A-Cath remains in place, unchanged. Stable diffuse bilateral airspace disease. Heart is borderline in size. No visible significant effusions or pneumothorax. IMPRESSION: Stable diffuse bilateral airspace disease. Electronically Signed   By: Rolm Baptise M.D.   On: 09/04/2018 07:58   Dg Chest Port 1 View  Result Date: 09/03/2018 CLINICAL DATA:  Hypoxia EXAM: PORTABLE CHEST 1 VIEW COMPARISON:  09/02/2018 FINDINGS: Diffuse bilateral airspace disease has worsened since prior study. Borderline cardiomegaly. No visible effusions. No acute bony abnormality. Right Port-A-Cath remains in place, unchanged. IMPRESSION: Worsening diffuse bilateral airspace disease which could reflect edema or infection. Electronically Signed   By: Rolm Baptise M.D.   On: 09/03/2018 11:29   Dg Chest Port 1 View  Result Date: 09/02/2018 CLINICAL DATA:  Hypoxia. EXAM: PORTABLE CHEST 1 VIEW COMPARISON:  CT 3 days ago 08/30/2018, radiograph 4 days ago 08/29/2018 FINDINGS: Right chest port with tip in the distal SVC. Unchanged heart size and mediastinal contours. Multifocal irregular airspace opacities with slight improvement on the right and no  significant change on the left. Pleural effusions on prior CT are not well visualized on portable AP view. No pneumothorax. IMPRESSION: Multifocal bilateral pulmonary opacities with some improvement on the right and no significant change on the left. Findings may represent multifocal pneumonia or pulmonary edema. Pleural effusions on prior CT are not well visualized radiographically on AP view. Electronically Signed   By: Keith Rake M.D.   On: 09/02/2018 01:34   Dg Swallowing Func-speech Pathology  Result Date: 09/09/2018 Objective  Swallowing Evaluation: Type of Study: MBS-Modified Barium Swallow Study  Patient Details Name: Allen Glenn MRN: [id number] Date of Birth: [birth year] Today's Date: 09/09/2018 Time: SLP Start Time (ACUTE ONLY): 0840 -SLP Stop Time (ACUTE ONLY): 0909 SLP Time Calculation (min) (ACUTE ONLY): 29 min Past Medical History: Past Medical History: Diagnosis Date . Acute MI ([place]) 2014  acute ST elevation MI . Anemia  . Colon cancer ([place]) 2016  Status post resection of colon mass is well as he panic metastasis.  . Coronary artery disease  . Diabetes mellitus without complication ([place])  . Enlarged prostate   slightly and takes Flomax daily . Heart disease  . History of blood transfusion  . Hyperlipidemia   takes Lipitor daily . Hypertension   takes Lisinopril daily . Lung nodule   left . Nocturia  . Numbness   left foot . Pancreatitis  . Peripheral neuropathy  Past Surgical History: Past Surgical History: Procedure Laterality Date . 1/8 of liver removed   . APPENDECTOMY   . APPLICATION OF CRANIAL NAVIGATION Left 2/42/6834  Procedure: APPLICATION OF CRANIAL NAVIGATION;  Surgeon: Erline Levine, MD;  Location: [place];  Service: Neurosurgery;  Laterality: Left; . BIOPSY  05/22/2018  Procedure: BIOPSY;  Surgeon: Carol Ada, MD;  Location: [facility];  Service: Endoscopy;; . BRAIN SURGERY  06/04/2018 . COLONOSCOPY   . CORONARY ANGIOPLASTY    1 stent . CORONARY STENT PLACEMENT  06-27-2013 .  CRANIOTOMY Left 06/04/2018  Procedure: CRANIOTOMY TUMOR EXCISION with Brainlab;  Surgeon: Erline Levine, MD;  Location: [place];  Service: Neurosurgery;  Laterality: Left;  CRANIOTOMY TUMOR EXCISION with Brainlab . ESOPHAGOGASTRODUODENOSCOPY Left 05/22/2018  Procedure: ESOPHAGOGASTRODUODENOSCOPY (EGD);  Surgeon: Carol Ada, MD;  Location: [facility];  Service: Endoscopy;  Laterality: Left; . HERNIA REPAIR Left 1991 . IR IMAGING GUIDED PORT INSERTION  07/18/2018 . LAPAROSCOPIC RIGHT COLECTOMY  2016  [place] . LIVER LOBECTOMY Right 09/28/2015   Right [facility] . LUNG BIOPSY Left 11/28/2016  Procedure: LUNG BIOPSY, left upper lobe;  Surgeon: [name], MD;  Location: [place];  Service: Thoracic;  Laterality: Left; Marland Kitchen VIDEO BRONCHOSCOPY WITH ENDOBRONCHIAL NAVIGATION N/A 11/28/2016  Procedure: VIDEO BRONCHOSCOPY WITH ENDOBRONCHIAL NAVIGATION;  Surgeon: [name], MD;  Location: [place];  Service: Thoracic;  Laterality: N/A; . VIDEO BRONCHOSCOPY WITH ENDOBRONCHIAL ULTRASOUND N/A 11/28/2016  Procedure: VIDEO BRONCHOSCOPY WITH ENDOBRONCHIAL ULTRASOUND;  Surgeon: [name], MD;  Location: [facility];  Service: Thoracic;  Laterality: N/A; HPI: 62 yo male adm to [facility] with hypoxia, increased lactic acid, febrile. 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  .  Pt has required Bipap on and off but currently is on 8 L HFNC.  He desires to eat.  concern for possible ARDS noted - pt with severe pna - right more than left patchy infiltrate.  WBC increasing from 5.7 to 11.5.  He has been made a DNR during this hospital coarse.   Pt's son and daughter present during eval and state pt wants to eat.   Subjective: pt awake in bed, sleepy but participative Assessment / Plan / Recommendation CHL IP CLINICAL IMPRESSIONS 09/09/2018 Clinical Impression Pt presents with moderately severe oral and mild pharyngeal dysphagia.   Decreased oral bolus control/organization and transiting noted due to weakness.  Pt frequently spills boluses into pharynx poorly controlled and oral residuals present.  He intermittently conducts dry swallow to clear oral cavity- at times  requiring verbal cue.  Pharyngeal swallow is strong when triggered.  Secretions noted in pharynx which pt does not sense= nor did he clear with intake.  SLP challenged pt during MBS by providing him with 3 boluses of applesauce to swallow to assess for fatigue.  At end of MBS, pt did present with minimal residuals in pharynx without awareness, cued dry swallow effective to clear.  Educated pt and wife to findings/recommendations and concerns for pt to not receive adequate nutrition due to current level of dysphagia.  Although he did not aspirate on this MBS, he will continue to be high risk due to oral deficits and gross weakness.  Dys1 (extra creamy) and thin would be least restrictive diet for him with strict precautions.  Educated pt/wife to findings/recommendations and provided swallow signs to RN.  Will follow up briefly for tolerance.   SLP Visit Diagnosis Dysphagia, oropharyngeal phase (R13.12);Dysphagia, unspecified (R13.10) Attention and concentration deficit following -- Frontal lobe and executive function deficit following -- Impact on safety and function Moderate aspiration risk   CHL IP TREATMENT RECOMMENDATION 09/09/2018 Treatment Recommendations Therapy as outlined in treatment plan below   Prognosis 09/09/2018 Prognosis for Safe Diet Advancement Fair Barriers to Reach Goals Severity of deficits;Other (Comment) Barriers/Prognosis Comment -- CHL IP DIET RECOMMENDATION 09/09/2018 SLP Diet Recommendations Dysphagia 1 (Puree) solids;Thin liquid Liquid Administration via Cup;Straw Medication Administration Crushed with puree Compensations Minimize environmental distractions;Slow rate;Small sips/bites;Other (Comment) Postural Changes Remain semi-upright after after  feeds/meals (Comment);Seated upright at 90 degrees   CHL IP OTHER RECOMMENDATIONS 09/09/2018 Recommended Consults -- Oral Care Recommendations (No Data) Other Recommendations Have oral suction available   CHL IP FOLLOW UP RECOMMENDATIONS 09/09/2018 Follow up Recommendations (No Data)   CHL IP FREQUENCY AND DURATION 09/09/2018 Speech Therapy Frequency (ACUTE ONLY) min 1 x/week Treatment Duration 1 week      CHL IP ORAL PHASE 09/09/2018 Oral Phase Impaired Oral - Pudding Teaspoon -- Oral - Pudding Cup -- Oral - Honey Teaspoon -- Oral - Honey Cup -- Oral - Nectar Teaspoon -- Oral - Nectar Cup -- Oral - Nectar Straw Delayed oral transit;Decreased bolus cohesion;Lingual/palatal residue;Premature spillage;Weak lingual manipulation;Lingual pumping;Reduced posterior propulsion Oral - Thin Teaspoon Delayed oral transit;Decreased bolus cohesion;Lingual/palatal residue;Premature spillage;Weak lingual manipulation;Reduced posterior propulsion Oral - Thin Cup Delayed oral transit;Decreased bolus cohesion;Lingual/palatal residue;Premature spillage;Weak lingual manipulation;Lingual pumping;Reduced posterior propulsion Oral - Thin Straw Delayed oral transit;Decreased bolus cohesion;Lingual/palatal residue;Premature spillage;Weak lingual manipulation;Lingual pumping Oral - Puree Delayed oral transit;Premature spillage;Weak lingual manipulation;Lingual/palatal residue;NT;Reduced posterior propulsion Oral - Mech Soft Delayed oral transit;Premature spillage;Weak lingual manipulation;Lingual/palatal residue;Lingual pumping;Reduced posterior propulsion;Impaired mastication Oral - Regular -- Oral - Multi-Consistency -- Oral - Pill Delayed oral transit;Premature spillage;Weak lingual manipulation;Lingual/palatal residue;Lingual pumping;Reduced posterior propulsion Oral Phase - Comment delayed 16 seconds to masticate a cracker bolus and transit, minimal mastication;  delay with puree 6-7 seconds oral transiting, premature spillage with poor  control noted, oral residuals present without inconsistent awareness  CHL IP PHARYNGEAL PHASE 09/09/2018 Pharyngeal Phase Impaired Pharyngeal- Pudding Teaspoon -- Pharyngeal -- Pharyngeal- Pudding Cup -- Pharyngeal -- Pharyngeal- Honey Teaspoon -- Pharyngeal -- Pharyngeal- Honey Cup -- Pharyngeal -- Pharyngeal- Nectar Teaspoon -- Pharyngeal -- Pharyngeal- Nectar Cup -- Pharyngeal -- Pharyngeal- Nectar Straw WFL Pharyngeal -- Pharyngeal- Thin Teaspoon -- Pharyngeal -- Pharyngeal- Thin Cup WFL Pharyngeal -- Pharyngeal- Thin Straw WFL;Delayed swallow initiation-pyriform sinuses;Pharyngeal residue - valleculae;Pharyngeal residue - pyriform Pharyngeal -- Pharyngeal- Puree WFL Pharyngeal -- Pharyngeal- Mechanical Soft WFL Pharyngeal -- Pharyngeal- Regular -- Pharyngeal -- Pharyngeal- Multi-consistency -- Pharyngeal --  Pharyngeal- Pill WFL Pharyngeal -- Pharyngeal Comment --  CHL IP CERVICAL ESOPHAGEAL PHASE 09/09/2018 Cervical Esophageal Phase Impaired Pudding Teaspoon -- Pudding Cup -- Honey Teaspoon -- Honey Cup -- Nectar Teaspoon -- Nectar Cup -- Nectar Straw -- Thin Teaspoon -- Thin Cup -- Thin Straw -- Puree -- Mechanical Soft -- Regular -- Multi-consistency -- Pill -- Cervical Esophageal Comment upon esophageal sweep after barium tablet administered with pudding, pt appeared with stasis in esophagus WITHOUT awareness, liquid bolus helpful to clear Macario Golds 09/09/2018, 10:04 AM    Luanna Salk, MS [facility] SLP Acute Rehab Services Pager [phone] Office [phone]             Subjective: In good spirits today  Discharge Exam: Vitals:   09/21/18 2028 09/22/18 0427  BP: 130/67 121/65  Pulse: (!) 111 89  Resp: 18 16  Temp: 97.6 F (36.4 C) 97.8 F (36.6 C)  SpO2: 93% 93%   Vitals:   09/21/18 1008 09/21/18 1330 09/21/18 2028 09/22/18 0427  BP: 139/65 (!) 153/78 130/67 121/65  Pulse: (!) 119 (!) 113 (!) 111 89  Resp:  _0 Temp:  99.1 F (37.3 C) 97.6 F (36.4 C) 97.8 F (36.6 C)   TempSrc:  Oral Oral Oral  SpO2:  99% 93% 93%  Weight:    77.6 kg  Height:        General: Pt is alert, awake, not in acute distress Cardiovascular: RRR, S1/S2 +, no rubs, no gallops Respiratory: CTA bilaterally, no wheezing, no rhonchi Abdominal: Soft, NT, ND, bowel sounds + Extremities: no edema, no cyanosis   The results of significant diagnostics from this hospitalization (including imaging, microbiology, ancillary and laboratory) are listed below for reference.     Microbiology: No results found for this or any previous visit (from the past 240 hour(s)).   Labs: BNP (last 3 results) Recent Labs    08/27/18 0931  BNP 61.6   Basic Metabolic Panel: Recent Labs  Lab 09/16/18 0623 09/17/18 0427  NA 135 136  K 3.6 3.4*  CL 103 107  CO2 22 21*  GLUCOSE 80 173*  BUN 11 11  CREATININE <0.30* <0.30*  CALCIUM 7.8* 8.0*   Liver Function Tests: No results for input(s): AST, ALT, ALKPHOS, BILITOT, PROT, ALBUMIN in the last 168 hours. No results for input(s): LIPASE, AMYLASE in the last 168 hours. No results for input(s): AMMONIA in the last 168 hours. CBC: No results for input(s): WBC, NEUTROABS, HGB, HCT, MCV, PLT in the last 168 hours. Cardiac Enzymes: No results for input(s): CKTOTAL, CKMB, CKMBINDEX, TROPONINI in the last 168 hours. BNP: Invalid input(s): POCBNP CBG: Recent Labs  Lab 09/21/18 1331 09/21/18 1553 09/21/18 2024 09/22/18 0423 09/22/18 0751  GLUCAP 208* 216* 318* 120* 116*   D-Dimer No results for input(s): DDIMER in the last 72 hours. Hgb A1c No results for input(s): HGBA1C in the last 72 hours. Lipid Profile No results for input(s): CHOL, HDL, LDLCALC, TRIG, CHOLHDL, LDLDIRECT in the last 72 hours. Thyroid function studies No results for input(s): TSH, T4TOTAL, T3FREE, THYROIDAB in the last 72 hours.  Invalid input(s): FREET3 Anemia work up No results for input(s): VITAMINB12, FOLATE, FERRITIN, TIBC, IRON, RETICCTPCT in the last 72  hours. Urinalysis    Component Value Date/Time   COLORURINE YELLOW 08/28/2018 1051   APPEARANCEUR CLEAR 08/28/2018 1051   LABSPEC 1.015 08/28/2018 1051   PHURINE 6.0 08/28/2018 1051   GLUCOSEU 150 (A) 08/28/2018 1051   HGBUR NEGATIVE 08/28/2018 1051  BILIRUBINUR NEGATIVE 08/28/2018 [place] 08/28/2018 1051   PROTEINUR NEGATIVE 08/28/2018 1051   NITRITE NEGATIVE 08/28/2018 1051   LEUKOCYTESUR NEGATIVE 08/28/2018 1051   Sepsis Labs Invalid input(s): PROCALCITONIN,  WBC,  LACTICIDVEN Microbiology No results found for this or any previous visit (from the past 240 hour(s)).  Time spent: 22mn  SIGNED:   SMarylu Lund MD  [facility] 09/22/2018, 11:42 AM  If 7PM-7AM, please contact night-coverage

## 2018-09-22 NOTE — Progress Notes (Signed)
IP PROGRESS NOTE  Subjective:   Allen Glenn remains in the hospital after admission 08/28/2018 with respiratory failure and "pneumonia".  He continues to require high level oxygen support.  His wife and a friend are at the bedside. He denies pain and dyspnea this morning.  He is being followed by palliative care medicine.  Transfer to [facility] has been recommended.  Allen Glenn is concerned [facility] will not be able to provide adequate care to keep him comfortable. Allen Glenn feels he has significant improvement in dyspnea when he receives Lasix.  objective: Vital signs in last 24 hours: Blood pressure 121/65, pulse 89, temperature 97.8 F (36.6 C), temperature source Oral, resp. rate 16, height _0  (1.88 m), weight 171 lb 1.2 oz (77.6 kg), SpO2 93 %.  Intake/Output from previous day: 09/29 0701 - 09/30 0700 In: 250 [I.V.:250] Out: 2750 [Urine:2750]  Physical Exam:  HEENT: No thrush Lungs: Decreased breath sounds with inspiratory rales at the lower posterior chest bilaterally, no respiratory distress Cardiac: Regular rate and rhythm Abdomen: Nontender, no hepatosplenomegaly Extremities: No leg edema Neurologic: Alert, follows commands, oriented  Portacath/PICC-without erythema   Medications: I have reviewed the patient's current medications.  Assessment/Plan:  1. 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 No BRAFmutation  Liver biopsy 05/04/2015-metastatic adenocarcinoma consistent with a colon primary ? Staging PET scan 06/08/2015-isolated segment 4A liver lesion ? Initiation of adjuvant FOLFOX 06/13/2015 ? Restaging CT 08/09/2015 revealed a slight decrease in a borderline ileocolic node, decrease in the hepatic dome metastasis, no new lesions ? Liver resection 09/28/2015-pathology consistent with metastatic colon cancer, negative  margins ? Adjuvant FOLFOX resumed 11/08/2015, oxaliplatin eliminated beginning 11/22/2015 secondary to neuropathy. He completed adjuvant chemotherapy 02/16/2016 ? Restaging chest CT 11/08/2016, compared to 05/07/2016 revealed a new 9 mm left upper lobe nodule, stable 2.2 cm right hepatic lesion ? PET scan 11/19/2016 confirmed a hypermetabolic left upper lobe nodule, hypermetabolic left paratracheal and pericardiac lymph nodes, and hypermetabolism associated with the hypoattenuating lesion in the dome of the liver ? Status post EBUSbiopsies of the left lingula nodule and a level 4Lnode on 11/28/2016-no evidence of malignancy ? CT chest 02/11/2017-increase in size of the left pulmonary nodule and epicardial lymph node ? Status post SBRT 2 the left lung nodule and mediastinum completed 03/14/2017 ? CTs 07/01/2017-new 9 mm focus along the right liver capsule, stable left subcapsular liver lesion, radiation change at site of left upper lobe nodule, stable pericardiallymph node ? CTs 11/25/2017-new 5 mm lingular nodule, enlargement of capsular-based right liver lesion, new capsular based right liver lesion capsular lesion at the hepatic dome ? CTs 02/25/2018-enlargement of small lung nodules, liver lesions and small mesenteric lymph node ? CT abdomen/pelvis 05/15/2018-new lower lobe pulmonary nodules, enlargement of liver lesions and right lower quadrant soft tissue nodules ? 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 Brain MRI 05/26/2018-3.5 similar left posterior fossa mass, 4 additional subcentimeter enhancing brain lesions-3 cerebellar, 1 left thalamic ? SRS to all 5 brain lesions 06/02/2018 ? Surgical excision of left cerebellar lesion 06/04/2018, adenocarcinoma consistent with colorectal primary ? MRI lumbar spine 06/18/2018-extensive metastases to the lumbar spine, sacrum, T12, and pelvis, pathologic fracture of L3 ? Initiation of  radiation from T12-S2 06/30/2018, completed 07/11/2018 ? Cycle 1 FOLFIRI 08/19/2018 2. Coronary artery disease status post a myocardial infarction in 2014  3. Hypertension  4. Hyperlipidemia  5. Diabetes  6. Admission 05/20/2018 with nausea/vomitingsecondary to a cerebellar metastasis  7.Oral candidiasis 06/11/2018-started on Mycelex troches  8. 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  Admission 08/28/2018 with a febrile illness, likely pneumonia  10.  Anemia/thrombocytopenia secondary to chemotherapy and infection-improved  11.  Respiratory failure secondary to pneumonia,?  ARDS-maintained on steroids and oxygen   Allen Glenn has metastatic colon cancer.  He has persistent respiratory failure requiring high level oxygen support.  The respiratory failure is likely secondary to ARDS versus persistent pneumonia.  He is not a candidate for further chemotherapy.  I discussed disposition options with Allen Glenn and his wife.  I support transfer to [facility].  I assured him measures are in place at [facility] to maintain his comfort level.  Nursing staff and Dr. Tomasa Hosteller are available to see him.  I will also plan to follow him at [facility].   Recommendations: 1.  Transfer to [facility] 2.  Continue oxygen, comfort measures 3.  I will see him at [facility]    LOS: 25 days   Betsy Coder, MD   09/22/2018, 1:53 PM

## 2018-09-22 NOTE — Progress Notes (Signed)
Palliative Care Progress Note  Reason for consult: Goals of care, symptom management (dyspnea)  I saw and examined Allen Glenn today.  Wife and daughter present in room.   He was awake and alert and able to converse wearing [place] at 15L.  Reports not currently feeling SOB and that currently ordered dose of morphine is effective when he needs it.  Discussed plan for transition to [facility].  Family is understandably anxious about any transition in care, but they also report being in agreement that this is the right move at this time.  His wife reports that they understand he is dying, but at times they are also hopeful for a miracle.   Provided supportive listening as family discussed his life as well as concerns about care plan moving forward (will he be able to get certain interventions- lasix, nonrebreather vs 15L oxygen, etc.)  Discussed with hospice liaison regarding family concerns, and I let family know that I spoke with hospice and I think that his care needs will be able to be met at [facility].  Discussed with Dr. Wyline Copas and he will assess to see if in agreement that he appears stable enough for discharge to [facility] today.  [facility] to follow up this morning/early afternoon.  Answered family questions to the best of my ability.  Total time: 55 minutes Greater than 50%  of this time was spent counseling and coordinating care related to the above assessment and plan.  Micheline Rough, MD [place] Team [phone]

## 2018-09-23 ENCOUNTER — Ambulatory Visit: Payer: 59 | Admitting: Interventional Cardiology

## 2018-09-23 DIAGNOSIS — C7800 Secondary malignant neoplasm of unspecified lung: Secondary | ICD-10-CM | POA: Diagnosis not present

## 2018-09-23 DIAGNOSIS — E1159 Type 2 diabetes mellitus with other circulatory complications: Secondary | ICD-10-CM | POA: Diagnosis not present

## 2018-09-23 DIAGNOSIS — I2510 Atherosclerotic heart disease of native coronary artery without angina pectoris: Secondary | ICD-10-CM | POA: Diagnosis not present

## 2018-09-23 DIAGNOSIS — J189 Pneumonia, unspecified organism: Secondary | ICD-10-CM | POA: Diagnosis not present

## 2018-09-23 DIAGNOSIS — C7951 Secondary malignant neoplasm of bone: Secondary | ICD-10-CM | POA: Diagnosis not present

## 2018-09-23 DIAGNOSIS — C787 Secondary malignant neoplasm of liver and intrahepatic bile duct: Secondary | ICD-10-CM | POA: Diagnosis not present

## 2018-09-23 DIAGNOSIS — C189 Malignant neoplasm of colon, unspecified: Secondary | ICD-10-CM | POA: Diagnosis not present

## 2018-09-23 DIAGNOSIS — I10 Essential (primary) hypertension: Secondary | ICD-10-CM | POA: Diagnosis not present

## 2018-09-23 DIAGNOSIS — C7931 Secondary malignant neoplasm of brain: Secondary | ICD-10-CM | POA: Diagnosis not present

## 2018-09-24 DIAGNOSIS — C787 Secondary malignant neoplasm of liver and intrahepatic bile duct: Secondary | ICD-10-CM | POA: Diagnosis not present

## 2018-09-24 DIAGNOSIS — C7931 Secondary malignant neoplasm of brain: Secondary | ICD-10-CM | POA: Diagnosis not present

## 2018-09-24 DIAGNOSIS — J189 Pneumonia, unspecified organism: Secondary | ICD-10-CM | POA: Diagnosis not present

## 2018-09-24 DIAGNOSIS — C189 Malignant neoplasm of colon, unspecified: Secondary | ICD-10-CM | POA: Diagnosis not present

## 2018-09-24 DIAGNOSIS — I2510 Atherosclerotic heart disease of native coronary artery without angina pectoris: Secondary | ICD-10-CM | POA: Diagnosis not present

## 2018-09-24 DIAGNOSIS — C7800 Secondary malignant neoplasm of unspecified lung: Secondary | ICD-10-CM | POA: Diagnosis not present

## 2018-09-24 DIAGNOSIS — E1159 Type 2 diabetes mellitus with other circulatory complications: Secondary | ICD-10-CM | POA: Diagnosis not present

## 2018-09-24 DIAGNOSIS — C7951 Secondary malignant neoplasm of bone: Secondary | ICD-10-CM | POA: Diagnosis not present

## 2018-09-24 DIAGNOSIS — I10 Essential (primary) hypertension: Secondary | ICD-10-CM | POA: Diagnosis not present

## 2018-09-25 DIAGNOSIS — I2510 Atherosclerotic heart disease of native coronary artery without angina pectoris: Secondary | ICD-10-CM | POA: Diagnosis not present

## 2018-09-25 DIAGNOSIS — E1159 Type 2 diabetes mellitus with other circulatory complications: Secondary | ICD-10-CM | POA: Diagnosis not present

## 2018-09-25 DIAGNOSIS — C189 Malignant neoplasm of colon, unspecified: Secondary | ICD-10-CM | POA: Diagnosis not present

## 2018-09-25 DIAGNOSIS — C7800 Secondary malignant neoplasm of unspecified lung: Secondary | ICD-10-CM | POA: Diagnosis not present

## 2018-09-25 DIAGNOSIS — C787 Secondary malignant neoplasm of liver and intrahepatic bile duct: Secondary | ICD-10-CM | POA: Diagnosis not present

## 2018-09-25 DIAGNOSIS — I10 Essential (primary) hypertension: Secondary | ICD-10-CM | POA: Diagnosis not present

## 2018-09-25 DIAGNOSIS — C7931 Secondary malignant neoplasm of brain: Secondary | ICD-10-CM | POA: Diagnosis not present

## 2018-09-25 DIAGNOSIS — J189 Pneumonia, unspecified organism: Secondary | ICD-10-CM | POA: Diagnosis not present

## 2018-09-25 DIAGNOSIS — C7951 Secondary malignant neoplasm of bone: Secondary | ICD-10-CM | POA: Diagnosis not present

## 2018-09-25 LAB — GLUCOSE, CAPILLARY
GLUCOSE-CAPILLARY: 242 mg/dL — AB (ref 70–99)
Glucose-Capillary: 199 mg/dL — ABNORMAL HIGH (ref 70–99)
Glucose-Capillary: 165 mg/dL — ABNORMAL HIGH (ref 70–99)
Glucose-Capillary: 99 mg/dL (ref 70–99)

## 2018-09-25 NOTE — Progress Notes (Signed)
FMLA for spouse, Kron Everton, successfully faxed to [employer] at [phone]. Mailed copy to patient address on file.

## 2018-09-26 DIAGNOSIS — C7951 Secondary malignant neoplasm of bone: Secondary | ICD-10-CM | POA: Diagnosis not present

## 2018-09-26 DIAGNOSIS — C787 Secondary malignant neoplasm of liver and intrahepatic bile duct: Secondary | ICD-10-CM | POA: Diagnosis not present

## 2018-09-26 DIAGNOSIS — C189 Malignant neoplasm of colon, unspecified: Secondary | ICD-10-CM | POA: Diagnosis not present

## 2018-09-26 DIAGNOSIS — I10 Essential (primary) hypertension: Secondary | ICD-10-CM | POA: Diagnosis not present

## 2018-09-26 DIAGNOSIS — J189 Pneumonia, unspecified organism: Secondary | ICD-10-CM | POA: Diagnosis not present

## 2018-09-26 DIAGNOSIS — C7800 Secondary malignant neoplasm of unspecified lung: Secondary | ICD-10-CM | POA: Diagnosis not present

## 2018-09-26 DIAGNOSIS — E1159 Type 2 diabetes mellitus with other circulatory complications: Secondary | ICD-10-CM | POA: Diagnosis not present

## 2018-09-26 DIAGNOSIS — C7931 Secondary malignant neoplasm of brain: Secondary | ICD-10-CM | POA: Diagnosis not present

## 2018-09-26 DIAGNOSIS — I2510 Atherosclerotic heart disease of native coronary artery without angina pectoris: Secondary | ICD-10-CM | POA: Diagnosis not present

## 2018-09-27 DIAGNOSIS — C787 Secondary malignant neoplasm of liver and intrahepatic bile duct: Secondary | ICD-10-CM | POA: Diagnosis not present

## 2018-09-27 DIAGNOSIS — E1159 Type 2 diabetes mellitus with other circulatory complications: Secondary | ICD-10-CM | POA: Diagnosis not present

## 2018-09-27 DIAGNOSIS — I2510 Atherosclerotic heart disease of native coronary artery without angina pectoris: Secondary | ICD-10-CM | POA: Diagnosis not present

## 2018-09-27 DIAGNOSIS — C7931 Secondary malignant neoplasm of brain: Secondary | ICD-10-CM | POA: Diagnosis not present

## 2018-09-27 DIAGNOSIS — I10 Essential (primary) hypertension: Secondary | ICD-10-CM | POA: Diagnosis not present

## 2018-09-27 DIAGNOSIS — C7800 Secondary malignant neoplasm of unspecified lung: Secondary | ICD-10-CM | POA: Diagnosis not present

## 2018-09-27 DIAGNOSIS — J189 Pneumonia, unspecified organism: Secondary | ICD-10-CM | POA: Diagnosis not present

## 2018-09-27 DIAGNOSIS — C189 Malignant neoplasm of colon, unspecified: Secondary | ICD-10-CM | POA: Diagnosis not present

## 2018-09-27 DIAGNOSIS — C7951 Secondary malignant neoplasm of bone: Secondary | ICD-10-CM | POA: Diagnosis not present

## 2018-09-28 DIAGNOSIS — I2510 Atherosclerotic heart disease of native coronary artery without angina pectoris: Secondary | ICD-10-CM | POA: Diagnosis not present

## 2018-09-28 DIAGNOSIS — C787 Secondary malignant neoplasm of liver and intrahepatic bile duct: Secondary | ICD-10-CM | POA: Diagnosis not present

## 2018-09-28 DIAGNOSIS — C7800 Secondary malignant neoplasm of unspecified lung: Secondary | ICD-10-CM | POA: Diagnosis not present

## 2018-09-28 DIAGNOSIS — C189 Malignant neoplasm of colon, unspecified: Secondary | ICD-10-CM | POA: Diagnosis not present

## 2018-09-28 DIAGNOSIS — C7931 Secondary malignant neoplasm of brain: Secondary | ICD-10-CM | POA: Diagnosis not present

## 2018-09-28 DIAGNOSIS — E1159 Type 2 diabetes mellitus with other circulatory complications: Secondary | ICD-10-CM | POA: Diagnosis not present

## 2018-09-28 DIAGNOSIS — C7951 Secondary malignant neoplasm of bone: Secondary | ICD-10-CM | POA: Diagnosis not present

## 2018-09-28 DIAGNOSIS — I10 Essential (primary) hypertension: Secondary | ICD-10-CM | POA: Diagnosis not present

## 2018-09-28 DIAGNOSIS — J189 Pneumonia, unspecified organism: Secondary | ICD-10-CM | POA: Diagnosis not present

## 2018-09-29 DIAGNOSIS — C189 Malignant neoplasm of colon, unspecified: Secondary | ICD-10-CM | POA: Diagnosis not present

## 2018-09-29 DIAGNOSIS — J189 Pneumonia, unspecified organism: Secondary | ICD-10-CM | POA: Diagnosis not present

## 2018-09-29 DIAGNOSIS — I2510 Atherosclerotic heart disease of native coronary artery without angina pectoris: Secondary | ICD-10-CM | POA: Diagnosis not present

## 2018-09-29 DIAGNOSIS — I10 Essential (primary) hypertension: Secondary | ICD-10-CM | POA: Diagnosis not present

## 2018-09-29 DIAGNOSIS — C7931 Secondary malignant neoplasm of brain: Secondary | ICD-10-CM | POA: Diagnosis not present

## 2018-09-29 DIAGNOSIS — C7800 Secondary malignant neoplasm of unspecified lung: Secondary | ICD-10-CM | POA: Diagnosis not present

## 2018-09-29 DIAGNOSIS — E1159 Type 2 diabetes mellitus with other circulatory complications: Secondary | ICD-10-CM | POA: Diagnosis not present

## 2018-09-29 DIAGNOSIS — C787 Secondary malignant neoplasm of liver and intrahepatic bile duct: Secondary | ICD-10-CM | POA: Diagnosis not present

## 2018-09-29 DIAGNOSIS — C7951 Secondary malignant neoplasm of bone: Secondary | ICD-10-CM | POA: Diagnosis not present

## 2018-09-30 DIAGNOSIS — J189 Pneumonia, unspecified organism: Secondary | ICD-10-CM | POA: Diagnosis not present

## 2018-09-30 DIAGNOSIS — C7931 Secondary malignant neoplasm of brain: Secondary | ICD-10-CM | POA: Diagnosis not present

## 2018-09-30 DIAGNOSIS — E1159 Type 2 diabetes mellitus with other circulatory complications: Secondary | ICD-10-CM | POA: Diagnosis not present

## 2018-09-30 DIAGNOSIS — I2510 Atherosclerotic heart disease of native coronary artery without angina pectoris: Secondary | ICD-10-CM | POA: Diagnosis not present

## 2018-09-30 DIAGNOSIS — C787 Secondary malignant neoplasm of liver and intrahepatic bile duct: Secondary | ICD-10-CM | POA: Diagnosis not present

## 2018-09-30 DIAGNOSIS — C189 Malignant neoplasm of colon, unspecified: Secondary | ICD-10-CM | POA: Diagnosis not present

## 2018-09-30 DIAGNOSIS — C7800 Secondary malignant neoplasm of unspecified lung: Secondary | ICD-10-CM | POA: Diagnosis not present

## 2018-09-30 DIAGNOSIS — I10 Essential (primary) hypertension: Secondary | ICD-10-CM | POA: Diagnosis not present

## 2018-09-30 DIAGNOSIS — C7951 Secondary malignant neoplasm of bone: Secondary | ICD-10-CM | POA: Diagnosis not present

## 2018-10-01 DIAGNOSIS — I10 Essential (primary) hypertension: Secondary | ICD-10-CM | POA: Diagnosis not present

## 2018-10-01 DIAGNOSIS — J189 Pneumonia, unspecified organism: Secondary | ICD-10-CM | POA: Diagnosis not present

## 2018-10-01 DIAGNOSIS — C7931 Secondary malignant neoplasm of brain: Secondary | ICD-10-CM | POA: Diagnosis not present

## 2018-10-01 DIAGNOSIS — C7951 Secondary malignant neoplasm of bone: Secondary | ICD-10-CM | POA: Diagnosis not present

## 2018-10-01 DIAGNOSIS — C189 Malignant neoplasm of colon, unspecified: Secondary | ICD-10-CM | POA: Diagnosis not present

## 2018-10-01 DIAGNOSIS — C7800 Secondary malignant neoplasm of unspecified lung: Secondary | ICD-10-CM | POA: Diagnosis not present

## 2018-10-01 DIAGNOSIS — I2510 Atherosclerotic heart disease of native coronary artery without angina pectoris: Secondary | ICD-10-CM | POA: Diagnosis not present

## 2018-10-01 DIAGNOSIS — C787 Secondary malignant neoplasm of liver and intrahepatic bile duct: Secondary | ICD-10-CM | POA: Diagnosis not present

## 2018-10-01 DIAGNOSIS — E1159 Type 2 diabetes mellitus with other circulatory complications: Secondary | ICD-10-CM | POA: Diagnosis not present

## 2018-10-02 ENCOUNTER — Other Ambulatory Visit: Payer: Self-pay | Admitting: *Deleted

## 2018-10-02 ENCOUNTER — Emergency Department (HOSPITAL_COMMUNITY)
Admission: EM | Admit: 2018-10-02 | Discharge: 2018-10-02 | Disposition: A | Discharge status: Home / Self Care | Payer: 59 | Attending: Emergency Medicine | Admitting: Emergency Medicine

## 2018-10-02 ENCOUNTER — Encounter (HOSPITAL_COMMUNITY): Payer: Self-pay | Admitting: *Deleted

## 2018-10-02 ENCOUNTER — Other Ambulatory Visit: Payer: Self-pay

## 2018-10-02 ENCOUNTER — Emergency Department (HOSPITAL_COMMUNITY): Payer: 59

## 2018-10-02 DIAGNOSIS — Z794 Long term (current) use of insulin: Secondary | ICD-10-CM | POA: Insufficient documentation

## 2018-10-02 DIAGNOSIS — I110 Hypertensive heart disease with heart failure: Secondary | ICD-10-CM | POA: Diagnosis not present

## 2018-10-02 DIAGNOSIS — I259 Chronic ischemic heart disease, unspecified: Secondary | ICD-10-CM | POA: Diagnosis not present

## 2018-10-02 DIAGNOSIS — R109 Unspecified abdominal pain: Secondary | ICD-10-CM | POA: Diagnosis not present

## 2018-10-02 DIAGNOSIS — C189 Malignant neoplasm of colon, unspecified: Secondary | ICD-10-CM | POA: Diagnosis not present

## 2018-10-02 DIAGNOSIS — E1159 Type 2 diabetes mellitus with other circulatory complications: Secondary | ICD-10-CM | POA: Diagnosis not present

## 2018-10-02 DIAGNOSIS — I471 Supraventricular tachycardia: Secondary | ICD-10-CM | POA: Diagnosis not present

## 2018-10-02 DIAGNOSIS — C7951 Secondary malignant neoplasm of bone: Secondary | ICD-10-CM | POA: Diagnosis not present

## 2018-10-02 DIAGNOSIS — R0602 Shortness of breath: Secondary | ICD-10-CM | POA: Diagnosis not present

## 2018-10-02 DIAGNOSIS — E119 Type 2 diabetes mellitus without complications: Secondary | ICD-10-CM | POA: Insufficient documentation

## 2018-10-02 DIAGNOSIS — I5032 Chronic diastolic (congestive) heart failure: Secondary | ICD-10-CM | POA: Insufficient documentation

## 2018-10-02 DIAGNOSIS — I10 Essential (primary) hypertension: Secondary | ICD-10-CM | POA: Diagnosis not present

## 2018-10-02 DIAGNOSIS — C787 Secondary malignant neoplasm of liver and intrahepatic bile duct: Secondary | ICD-10-CM | POA: Diagnosis not present

## 2018-10-02 DIAGNOSIS — C7931 Secondary malignant neoplasm of brain: Secondary | ICD-10-CM | POA: Diagnosis not present

## 2018-10-02 DIAGNOSIS — R079 Chest pain, unspecified: Secondary | ICD-10-CM | POA: Diagnosis not present

## 2018-10-02 DIAGNOSIS — Z79899 Other long term (current) drug therapy: Secondary | ICD-10-CM | POA: Diagnosis not present

## 2018-10-02 DIAGNOSIS — J189 Pneumonia, unspecified organism: Secondary | ICD-10-CM | POA: Diagnosis not present

## 2018-10-02 DIAGNOSIS — C7800 Secondary malignant neoplasm of unspecified lung: Secondary | ICD-10-CM | POA: Diagnosis not present

## 2018-10-02 DIAGNOSIS — I213 ST elevation (STEMI) myocardial infarction of unspecified site: Secondary | ICD-10-CM | POA: Diagnosis not present

## 2018-10-02 DIAGNOSIS — R0789 Other chest pain: Secondary | ICD-10-CM | POA: Diagnosis not present

## 2018-10-02 DIAGNOSIS — R279 Unspecified lack of coordination: Secondary | ICD-10-CM | POA: Diagnosis not present

## 2018-10-02 DIAGNOSIS — Z743 Need for continuous supervision: Secondary | ICD-10-CM | POA: Diagnosis not present

## 2018-10-02 DIAGNOSIS — I2510 Atherosclerotic heart disease of native coronary artery without angina pectoris: Secondary | ICD-10-CM | POA: Diagnosis not present

## 2018-10-02 LAB — CBC WITH DIFFERENTIAL/PLATELET
ABS IMMATURE GRANULOCYTES: 0.28 10*3/uL — AB (ref 0.00–0.07)
BASOS ABS: 0 10*3/uL (ref 0.0–0.1)
BASOS PCT: 0 %
Eosinophils Absolute: 0.2 10*3/uL (ref 0.0–0.5)
Eosinophils Relative: 3 %
HCT: 30.4 % — ABNORMAL LOW (ref 39.0–52.0)
Hemoglobin: 8.7 g/dL — ABNORMAL LOW (ref 13.0–17.0)
Immature Granulocytes: 5 %
Lymphocytes Relative: 13 %
Lymphs Abs: 0.7 10*3/uL (ref 0.7–4.0)
MCH: 29.3 pg (ref 26.0–34.0)
MCHC: 28.6 g/dL — ABNORMAL LOW (ref 30.0–36.0)
MCV: 102.4 fL — ABNORMAL HIGH (ref 80.0–100.0)
Monocytes Absolute: 0.5 10*3/uL (ref 0.1–1.0)
Monocytes Relative: 10 %
NEUTROS ABS: 3.7 10*3/uL (ref 1.7–7.7)
NEUTROS PCT: 69 %
NRBC: 1.8 % — AB (ref 0.0–0.2)
PLATELETS: 142 10*3/uL — AB (ref 150–400)
RBC: 2.97 MIL/uL — AB (ref 4.22–5.81)
RDW: 17.2 % — ABNORMAL HIGH (ref 11.5–15.5)
WBC: 5.4 10*3/uL (ref 4.0–10.5)

## 2018-10-02 LAB — I-STAT TROPONIN, ED
TROPONIN I, POC: 0.07 ng/mL (ref 0.00–0.08)
Troponin i, poc: 0 ng/mL (ref 0.00–0.08)

## 2018-10-02 LAB — BASIC METABOLIC PANEL
Anion gap: 12 (ref 5–15)
BUN: 7 mg/dL — ABNORMAL LOW (ref 8–23)
CO2: 25 mmol/L (ref 22–32)
CREATININE: 0.3 mg/dL — AB (ref 0.61–1.24)
Calcium: 8.1 mg/dL — ABNORMAL LOW (ref 8.9–10.3)
Chloride: 104 mmol/L (ref 98–111)
GFR calc Af Amer: >60 mL/min (ref >60)
Glucose, Bld: 194 mg/dL — ABNORMAL HIGH (ref 70–99)
POTASSIUM: 3.2 mmol/L — AB (ref 3.5–5.1)
SODIUM: 141 mmol/L (ref 135–145)

## 2018-10-02 LAB — BRAIN NATRIURETIC PEPTIDE: B NATRIURETIC PEPTIDE 5: 60.2 pg/mL (ref 0.0–100.0)

## 2018-10-02 MED ORDER — MORPHINE SULFATE (PF) 4 MG/ML IV SOLN
4.0000 mg | Freq: Once | INTRAVENOUS | Status: AC
  Administered 2018-10-02: 4 mg via INTRAVENOUS
  Filled 2018-10-02: qty 1

## 2018-10-02 MED ORDER — IOPAMIDOL (ISOVUE-370) INJECTION 76%: 100.0000 mL | Freq: Once | INTRAVENOUS | Status: DC | PRN

## 2018-10-02 MED ORDER — IOPAMIDOL (ISOVUE-370) INJECTION 76%
INTRAVENOUS | Status: AC
  Administered 2018-10-02: 100 mL via INTRAVENOUS
  Filled 2018-10-02: qty 100

## 2018-10-02 MED ORDER — HEPARIN SOD (PORK) LOCK FLUSH 100 UNIT/ML IV SOLN
500.0000 [IU] | Freq: Once | INTRAVENOUS | Status: AC
  Administered 2018-10-02: 500 [IU]
  Filled 2018-10-02: qty 5

## 2018-10-02 MED ORDER — ASPIRIN 81 MG PO CHEW
324.0000 mg | CHEWABLE_TABLET | Freq: Once | ORAL | Status: AC
  Administered 2018-10-02: 324 mg via ORAL
  Filled 2018-10-02: qty 4

## 2018-10-02 MED ORDER — ONDANSETRON HCL 4 MG/2ML IJ SOLN
4.0000 mg | Freq: Once | INTRAMUSCULAR | Status: AC
  Administered 2018-10-02: 4 mg via INTRAVENOUS
  Filled 2018-10-02: qty 2

## 2018-10-02 NOTE — ED Provider Notes (Signed)
[facility] EMERGENCY DEPARTMENT Provider Note   CSN: [id number] Arrival date & time: 10/02/18  0315     History   Chief Complaint Chief Complaint  Patient presents with  . Shortness of Breath  . Chest Pain    HPI Allen Glenn is a 63 y.o. male.  Patient presents to the emergency department for evaluation of chest pain.  Patient reports that he was awakened from sleep at 2 AM with severe chest pain and shortness of breath.  EMS responded to his home.  He was in a narrow complex tachycardia at 180 bpm.  EMS report that it was regular, suspected SVT.  While they were attempting to get an IV, patient spontaneously converted to sinus rhythm.  Patient reported resolution of his chest pain and shortness of breath when he converted to sinus rhythm.  He is comfortable at arrival to the ER.     Past Medical History:  Diagnosis Date  . Acute MI ([place]) 2014   acute ST elevation MI  . Anemia   . Colon cancer ([place]) 2016   Status post resection of colon mass is well as he panic metastasis.   . Coronary artery disease   . Diabetes mellitus without complication ([place])   . Enlarged prostate    slightly and takes Flomax daily  . Heart disease   . History of blood transfusion   . Hyperlipidemia    takes Lipitor daily  . Hypertension    takes Lisinopril daily  . Lung nodule    left  . Nocturia   . Numbness    left foot  . Pancreatitis   . Peripheral neuropathy     Patient Active Problem List   Diagnosis Date Noted  . Protein-calorie malnutrition, severe 09/16/2018  . SOB (shortness of breath)   . Hyponatremia   . Hyperkalemia   . Hypoxia   . Acute respiratory failure with hypoxia ([place]) 09/01/2018  . Electrolyte imbalance 09/01/2018  . Pneumonia 09/01/2018  . Atherosclerosis of aorta ([place]) 09/01/2018  . Chemotherapy-induced neutropenia ([place])   . Fever 08/28/2018  . Sepsis ([place]) 08/28/2018  . Pancytopenia ([place]) 07/17/2018  . Weakness generalized 07/14/2018   . Cancer related pain   . Palliative care by specialist   . DNR (do not resuscitate) discussion 06/11/2018  . Insulin dependent type 2 diabetes mellitus, uncontrolled ([place]) 06/05/2018  . Coronary artery disease 06/05/2018  . Hyperlipidemia 06/05/2018  . 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 Hypertension 06/05/2018  . Anemia 06/05/2018  . Brain metastases ([place]) 06/02/2018  . Elevated alkaline phosphatase level   . Other chronic pancreatitis ([place]) 05/20/2018  . Metastatic colon cancer to liver ([place]) 05/20/2018  . Pulmonary metastasis ([place]) 12/20/2016  . Colon cancer s/p right colectomy  06/13/2016  . Chronic diastolic heart failure ([place]) 06/13/2016  . HTN (hypertension) 05/03/2015  . Symptomatic anemia 04/09/2015  . Iron deficiency anemia 04/09/2015  . CAD s/p PCI LAD 06/2013 [facility] 04/09/2015  . Lumbar radiculopathy 03/07/2015  . Hereditary and idiopathic peripheral neuropathy 03/07/2015    Past Surgical History:  Procedure Laterality Date  . 1/8 of liver removed    . APPENDECTOMY    . APPLICATION OF CRANIAL NAVIGATION Left 06/04/2018   Procedure: APPLICATION OF CRANIAL NAVIGATION;  Surgeon: Erline Levine, MD;  Location: [facility];  Service: Neurosurgery;  Laterality: Left;  . BIOPSY  05/22/2018   Procedure: BIOPSY;  Surgeon: Carol Ada, MD;  Location: [facility];  Service: Endoscopy;;  .  BRAIN SURGERY  06/04/2018  . COLONOSCOPY    . CORONARY ANGIOPLASTY     1 stent  . CORONARY STENT PLACEMENT  06-27-2013  . CRANIOTOMY Left 06/04/2018   Procedure: CRANIOTOMY TUMOR EXCISION with Brainlab;  Surgeon: Erline Levine, MD;  Location: [facility];  Service: Neurosurgery;  Laterality: Left;  CRANIOTOMY TUMOR EXCISION with Brainlab  . ESOPHAGOGASTRODUODENOSCOPY Left 05/22/2018   Procedure: ESOPHAGOGASTRODUODENOSCOPY (EGD);  Surgeon: Carol Ada, MD;  Location: [facility];  Service: Endoscopy;  Laterality: Left;  . HERNIA REPAIR Left 1991  . IR IMAGING GUIDED PORT  INSERTION  07/18/2018  . LAPAROSCOPIC RIGHT COLECTOMY  2016   [place]  . LIVER LOBECTOMY Right 09/28/2015    [facility]  . LUNG BIOPSY Left 11/28/2016   Procedure: LUNG BIOPSY, left upper lobe;  Surgeon: [name], MD;  Location: [facility];  Service: Thoracic;  Laterality: Left;  Marland Kitchen VIDEO BRONCHOSCOPY WITH ENDOBRONCHIAL NAVIGATION N/A 11/28/2016   Procedure: VIDEO BRONCHOSCOPY WITH ENDOBRONCHIAL NAVIGATION;  Surgeon: [name], MD;  Location: [facility];  Service: Thoracic;  Laterality: N/A;  . VIDEO BRONCHOSCOPY WITH ENDOBRONCHIAL ULTRASOUND N/A 11/28/2016   Procedure: VIDEO BRONCHOSCOPY WITH ENDOBRONCHIAL ULTRASOUND;  Surgeon: [name], MD;  Location: [facility];  Service: Thoracic;  Laterality: N/A;        Home Medications    Prior to Admission medications   Medication Sig Start Date End Date Taking? Authorizing Provider  gabapentin (NEURONTIN) 300 MG capsule Take 1 capsule (300 mg total) by mouth 3 (three) times daily. 1 to 2 PO QHS Patient taking differently: Take 200 mg by mouth 2 (two) times daily.  06/25/18  Yes Hayden Pedro, PA-C  acetaminophen (TYLENOL) 500 MG tablet Take 500 mg by mouth every 6 (six) hours as needed (FOR PAIN/HEADACHES.).     [provider]  calcium carbonate (TUMS - DOSED IN MG ELEMENTAL CALCIUM) 500 MG chewable tablet Chew 2 tablets (400 mg of elemental calcium total) by mouth 3 (three) times daily as needed for indigestion or heartburn. 09/22/18   Donne Hazel, MD  chlorpheniramine-HYDROcodone (TUSSIONEX) 10-8 MG/5ML SUER Take 5 mLs by mouth every 12 (twelve) hours as needed for cough. 09/22/18   Donne Hazel, MD  insulin aspart (NOVOLOG) 100 UNIT/ML injection Inject 0-15 Units into the skin 3 (three) times daily with meals. 09/22/18   Donne Hazel, MD  insulin glargine (LANTUS) 100 UNIT/ML injection Inject 0.2 mLs (20 Units total) into the skin at bedtime. 09/22/18   Donne Hazel, MD  levalbuterol  Penne Lash) 1.25 MG/0.5ML nebulizer solution Take 1.25 mg by nebulization every 4 (four) hours as needed for wheezing or shortness of breath. 09/22/18   Donne Hazel, MD  LORazepam (ATIVAN) 2 MG/ML injection Inject 0.25 mLs (0.5 mg total) into the vein 3 times/day as needed-between meals & bedtime for anxiety or sedation. 09/22/18   Donne Hazel, MD  magic mouthwash SOLN Take 5 mLs by mouth 4 (four) times daily as needed for mouth pain. Patient taking differently: Take 5 mLs by mouth 3 (three) times daily.  07/28/18   Tanner, Lyndon Code., PA-C  menthol-cetylpyridinium (CEPACOL) 3 MG lozenge Take 1 lozenge (3 mg total) by mouth as needed for sore throat. 09/22/18   Donne Hazel, MD  metoCLOPramide (REGLAN) 5 MG/ML injection Inject 2 mLs (10 mg total) into the vein every 12 (twelve) hours as needed. 09/22/18   Donne Hazel, MD  morphine (MSIR) 15 MG tablet Take  1 tablet (15 mg total) by mouth every 4 (four) hours as needed for moderate pain. 09/22/18   Donne Hazel, MD  morphine 2 MG/ML injection Inject 0.5 mLs (1 mg total) into the vein every hour as needed (or air hunger). 09/22/18   Donne Hazel, MD  nitroGLYCERIN (NITROSTAT) 0.4 MG SL tablet Place 0.4 mg under the tongue every 5 (five) minutes as needed for chest pain. CALL 911 IF PAIN PERSISTS 11/24/14   [provider]  nystatin (MYCOSTATIN) 100000 UNIT/ML suspension Take 5 mLs (500,000 Units total) by mouth 4 (four) times daily. 09/22/18   Donne Hazel, MD  ondansetron (ZOFRAN) 4 MG tablet Take 1 tablet (4 mg total) by mouth every 6 (six) hours as needed for nausea (if reglan not effective). 09/22/18   Donne Hazel, MD  ondansetron [facility]) 4 MG/2ML SOLN injection Inject 2 mLs (4 mg total) into the vein every 6 (six) hours as needed for nausea. 09/22/18   Donne Hazel, MD  pantoprazole (PROTONIX) 40 MG tablet Take 1 tablet (40 mg total) by mouth daily. 09/23/18   Donne Hazel, MD  polyethylene glycol [facility] Floria Raveling) packet  Take 17 g by mouth daily as needed for mild constipation. 09/22/18   Donne Hazel, MD  polyvinyl alcohol (LIQUIFILM TEARS) 1.4 % ophthalmic solution Place 1 drop into both eyes 3 (three) times daily. 09/22/18   Donne Hazel, MD  predniSONE (DELTASONE) 50 MG tablet 50mg  po daily 09/23/18   Donne Hazel, MD  tamsulosin [facility]) 0.4 MG CAPS capsule Take 0.4 mg by mouth daily after breakfast. 30 minutes after breakfast 02/24/15   [provider]  traZODone (DESYREL) 50 MG tablet Take 1 tablet (50 mg total) by mouth at bedtime as needed for sleep. 09/22/18   Donne Hazel, MD    Family History Family History  Problem Relation Age of Onset  . Hypertension Mother   . Heart disease Mother   . Hypertension Father   . Heart disease Father   . Neuropathy Neg Hx     Social History Social History   Tobacco Use  . Smoking status: Never Smoker  . Smokeless tobacco: Never Used  Substance Use Topics  . Alcohol use: No    Alcohol/week: 0.0 standard drinks  . Drug use: No     Allergies   Patient has no known allergies.   Review of Systems Review of Systems  Respiratory: Positive for shortness of breath.   Cardiovascular: Positive for chest pain.  All other systems reviewed and are negative.    Physical Exam Updated Vital Signs BP 107/72   Pulse 90   Resp 17   SpO2 99%   Physical Exam  Constitutional: He is oriented to person, place, and time. He appears well-developed and well-nourished. No distress.  HENT:  Head: Normocephalic and atraumatic.  Right Ear: Hearing normal.  Left Ear: Hearing normal.  Nose: Nose normal.  Mouth/Throat: Oropharynx is clear and moist and mucous membranes are normal.  Eyes: Pupils are equal, round, and reactive to light. Conjunctivae and EOM are normal.  Neck: Normal range of motion. Neck supple.  Cardiovascular: Regular rhythm, S1 normal and S2 normal. Exam reveals no gallop and no friction rub.  No murmur heard. Pulmonary/Chest:  Effort normal and breath sounds normal. No respiratory distress. He exhibits no tenderness.  Abdominal: Soft. Normal appearance and bowel sounds are normal. There is no hepatosplenomegaly. There is no tenderness. There is no rebound, no  guarding, no tenderness at McBurney's point and negative Murphy's sign. No hernia.  Musculoskeletal: Normal range of motion.  Neurological: He is alert and oriented to person, place, and time. He has normal strength. No cranial nerve deficit or sensory deficit. Coordination normal. GCS eye subscore is 4. GCS verbal subscore is 5. GCS motor subscore is 6.  Skin: Skin is warm, dry and intact. No rash noted. No cyanosis.  Psychiatric: He has a normal mood and affect. His speech is normal and behavior is normal. Thought content normal.  Nursing note and vitals reviewed.    ED Treatments / Results  Labs (all labs ordered are listed, but only abnormal results are displayed) Labs Reviewed  CBC WITH DIFFERENTIAL/PLATELET - Abnormal; Notable for the following components:      Result Value   RBC 2.97 (*)    Hemoglobin 8.7 (*)    HCT 30.4 (*)    MCV 102.4 (*)    MCHC 28.6 (*)    RDW 17.2 (*)    Platelets 142 (*)    nRBC 1.8 (*)    Abs Immature Granulocytes 0.28 (*)    All other components within normal limits  BASIC METABOLIC PANEL - Abnormal; Notable for the following components:   Potassium 3.2 (*)    Glucose, Bld 194 (*)    BUN 7 (*)    Creatinine, Ser 0.30 (*)    Calcium 8.1 (*)    All other components within normal limits  BRAIN NATRIURETIC PEPTIDE  I-STAT TROPONIN, ED  I-STAT TROPONIN, ED    EKG EKG Interpretation  Date/Time:  Thursday October 02 2018 03:21:10 EDT Ventricular Rate:  123 PR Interval:    QRS Duration: 84 QT Interval:  316 QTC Calculation: 452 R Axis:   -23 Text Interpretation:  Sinus tachycardia Probable left atrial enlargement Borderline left axis deviation Borderline low voltage, extremity leads Confirmed by Orpah Greek [id number]) on 10/02/2018 3:29:26 AM  Pre-Hospital:     Radiology Dg Chest 2 View  Result Date: 10/02/2018 CLINICAL DATA:  Shortness of breath and chest pain. Increased heart rate. History of metastatic cancer. EXAM: CHEST - 2 VIEW COMPARISON:  09/18/2018 FINDINGS: Power port type central venous catheter with tip over the cavoatrial junction region. Cardiac enlargement with mild vascular congestion. Patchy airspace consolidation in the mid and lower lung zones bilaterally, mildly progressed since previous study. Small bilateral pleural effusions. No pneumothorax. Mediastinal contours appear intact. IMPRESSION: Cardiac enlargement with mild pulmonary vascular congestion. Bilateral airspace consolidation in the mid and lower lung zones with bilateral pleural effusions, demonstrating some progression since previous study. Electronically Signed   By: Lucienne Capers M.D.   On: 10/02/2018 04:21   Ct Angio Chest/abd/pel For Dissection W &/or Wo Contrast  Addendum Date: 10/02/2018   ADDENDUM REPORT: 10/02/2018 08:34 ADDENDUM: Reflux of contrast into the inferior vena cava and hepatic veins may be indicative of increase in right heart pressure. Electronically Signed   By: Lowella Grip III M.D.   On: 10/02/2018 08:34   Result Date: 10/02/2018 CLINICAL DATA:  Chest pain. Abdominal pain. History of colon carcinoma EXAM: CT ANGIOGRAPHY CHEST, ABDOMEN AND PELVIS TECHNIQUE: Initially, axial CT images were obtained through the chest without intravenous contrast. Multidetector CT imaging through the chest, abdomen and pelvis was performed using the standard protocol during bolus administration of intravenous contrast. Multiplanar reconstructed images and MIPs were obtained and reviewed to evaluate the vascular anatomy. CONTRAST:  100 mL ISOVUE-370 IOPAMIDOL (ISOVUE-370) INJECTION 76% COMPARISON:  Chest CT  August 30, 2018; chest radiograph October 02, 2018 FINDINGS: CTA CHEST FINDINGS  Cardiovascular: There is no intramural hematoma in the thoracic aorta on this noncontrast enhanced study. No thoracic aortic aneurysm or dissection evident. Visualized great vessels appear unremarkable except for modest calcification in the proximal right subclavian artery. There is no demonstrable pulmonary embolus. There is aortic atherosclerosis. There are foci of coronary artery calcification. There is no pericardial effusion or pericardial thickening evident. Mediastinum/Nodes: Thyroid appears unremarkable. There are subcentimeter mediastinal lymph nodes scattered throughout the mediastinum. There is a lymph node to the left of the carina measuring 1.4 x 1.3 cm. No esophageal lesions are evident. Lungs/Pleura: There are moderate free-flowing pleural effusions bilaterally. There is consolidation with compressive atelectasis in both lower lobes. There is airspace consolidation elsewhere in the lower lobes as well as in the right middle lobe and lingula. Multiple areas of patchy consolidation are noted throughout the upper lobes as well. There is a nodular opacity in the anterior segment of the right upper lobe measuring 2.8 x 2.2 cm. Several nodular opacities are noted throughout the lungs which are partially obscured by consolidation. These nodular opacities range in size from as small as 3 mm to as large as 1 cm. Musculoskeletal: There is degenerative change in the thoracic spine. There are sclerotic lesions in multiple thoracic vertebral bodies, felt to represent metastatic disease. Metastatic disease is also noted in the right scapula superiorly. Paraspinous extension on the right at T4 appears stable. Review of the MIP images confirms the above findings. CTA ABDOMEN AND PELVIS FINDINGS VASCULAR Aorta: There is no thoracic aortic aneurysm or dissection. There is mild atherosclerotic calcification in the aorta. Celiac: Celiac artery and its major branches are widely patent. No aneurysm or dissection. SMA:  There is slight narrowing at the origin of the superior mesenteric artery with mild plaque in this area. No hemodynamically significant obstruction. No aneurysm or dissection involving the superior mesenteric artery and its major branches. Renals: There is a single renal artery on each side. No appreciable obstructive disease is noted in the renal arteries. No fibromuscular dysplasia. No aneurysm or dissection. IMA: Inferior mesenteric artery and its branches are patent. No aneurysm or dissection. Inflow: The major pelvic arterial vessels appear widely patent without appreciable obstructive disease. No aneurysm or dissection involving these vessels evident. The proximal superficial femoral and profunda femoral arteries are also patent. Veins: No obvious venous abnormality within the limitations of this arterial phase study. Review of the MIP images confirms the above findings. NON-VASCULAR Hepatobiliary: Liver measures 22.7 cm in length. There is focal decreased attenuation in the dome of the liver on the right measuring 2.0 x 1.5 cm, concerning for a potential metastatic focus. No other focal liver lesions are identified on this arterial phase examination. Gallbladder wall is not appreciably thickened. There is no biliary duct dilatation. Note that there is reflux into the inferior vena cava and hepatic veins. Pancreas: No pancreatic mass or inflammatory focus. Spleen: No splenic lesions are evident. There is a small accessory spleen inferior to the spleen. Adrenals/Urinary Tract: Adrenals bilaterally appear normal. Kidneys bilaterally show no evident mass or hydronephrosis. There is no evident renal or ureteral calculus on either side. Urinary bladder is midline with wall thickness increased. Air is noted in the urinary bladder. Stomach/Bowel: There is no appreciable bowel wall or mesenteric thickening. There is no evident bowel obstruction. No free air or portal venous air. Postoperative changes noted in the  right colon with anastomosis patent. Lymphatic: There  Is No appreciable adenopathy in the abdomen or pelvis. Reproductive: Prostate and seminal vesicles appear normal in size and contour. No evident pelvic mass. Other: Appendix absent. No abscess or ascites is evident in the abdomen or pelvis. There are no appreciable omental lesions. Musculoskeletal: There Is metastatic disease with both sclerotic and lytic lesions involving the L3 vertebral body with mild wedging at L3. There are sclerotic lesions in the L1 and L2 vertebral bodies. There are several blastic lesions throughout the pelvis as well as much of the proximal right femur. No intramuscular lesions are evident. No abdominal wall lesions are evident. Review of the MIP images confirms the above findings. IMPRESSION: CT angiogram chest: 1. No thoracic aortic aneurysm or dissection. No demonstrable pulmonary embolus. There is thoracic aortic atherosclerosis as well as foci of coronary artery calcification. 2. Moderate free-flowing pleural effusions bilaterally. Extensive airspace consolidation in both lower lobes. Extensive airspace opacity consistent with widespread pneumonia throughout the lungs bilaterally. 3. Widespread nodular opacities throughout the lungs, partially obscured by infiltrate, felt to represent widespread metastatic disease. 4 .Areas of bony metastatic disease noted at several sites, not significantly changed from 1 month prior. 5. Mildly enlarged lymph node to the left of the carina. Stable subcentimeter lymph nodes elsewhere. CT angiogram abdomen; CT angiogram pelvis: 1. Areas of relatively mild atherosclerotic plaque in the aorta and origin of the superior mesenteric artery. No evidence aneurysm or dissection in the aorta or major mesenteric arterial vessels. 2. Prominent liver with apparent metastatic focus in the dome of the liver region measuring approximately 2 cm. 3. Diffuse thickening of the urinary bladder wall, a finding likely  due to cystitis. Air is seen within the urinary bladder raising concern for potential gas-forming organism within the urinary bladder. Urinalysis correlation advised. No fistula seen between bowel and bladder. 4.  Widespread bony metastatic disease. 5. No evident bowel obstruction. No abscess in the abdomen or pelvis. 6.   No omental lesions evident.  No adenopathy. 6.  No renal or ureteral calculus. Electronically Signed: By: Lowella Grip III M.D. On: 10/02/2018 08:06    Procedures Procedures (including critical care time)  Medications Ordered in ED Medications  iopamidol (ISOVUE-370) 76 % injection 100 mL (has no administration in time range)  heparin lock flush 100 unit/mL (has no administration in time range)  aspirin chewable tablet 324 mg (324 mg Oral Given 10/02/18 0403)  morphine 4 MG/ML injection 4 mg (4 mg Intravenous Given 10/02/18 0403)  ondansetron (ZOFRAN) injection 4 mg (4 mg Intravenous Given 10/02/18 0402)  iopamidol (ISOVUE-370) 76 % injection (100 mLs Intravenous Contrast Given 10/02/18 0733)     Initial Impression / Assessment and Plan / ED Course  I have reviewed the triage vital signs and the nursing notes.  Pertinent labs & imaging results that were available during my care of the patient were reviewed by me and considered in my medical decision making (see chart for details).     Patient presents to the ER with complaints of chest pain.  He does have a history of cardiac disease, stenting in place.  Patient is brought by EMS from hospice.  He is currently inpatient hospice after an extended hospitalization for pneumonia and ARDS.  EMS documented a supraventricular tachycardia at 188 bpm.  He spontaneously converted to sinus rhythm.  Patient did, however, continue to have chest pain despite being in sinus rhythm.  Because of his previous cardiac history, further work-up was performed.  Patient's blood work is unremarkable.  A  CT of his chest was performed because  he had chest and back pain, this was to rule out aortic dissection.  He has no PE, no dissection, but he does have diffuse consolidation throughout both lung fields.  Radiology reads this as pneumonia.  Reviewing his records reveals during his hospital stay he had extensive metastatic disease in his lungs.  Family reports that he was sent to hospice on 15 L of oxygen continuously, has been weaned down to 5 L by nasal cannula.  He is breathing much more comfortably.  His current picture does not support a diagnosis of extensive pneumonia as seen on CT.  This is likely his metastatic disease.  I discussed this at length with the patient, his wife and his daughter.  I offered hospitalization.  Patient states that he would like to go back to hospice.  He understands that his disease is worsening and that he does not have many options for treatment.  He states "is in God's hands now".  He wishes to go back and just be kept comfortable.  His family has discussed this with him extensively both in my presence and out of my presence.  They are all in agreement that he go back to hospice.  I did counsel he and his family that he can return to the ER at any point time.  Final Clinical Impressions(s) / ED Diagnoses   Final diagnoses:  SVT (supraventricular tachycardia) [facility])    ED Discharge Orders    None       Preeya Cleckley, Gwenyth Allegra, MD 10/02/18 0900

## 2018-10-02 NOTE — ED Notes (Signed)
Called [facility] [employer] to cancel [employer] transport

## 2018-10-02 NOTE — ED Notes (Signed)
Patient transported to X-ray 

## 2018-10-02 NOTE — Patient Outreach (Addendum)
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 Allen Glenn has known stage IV colon cancer with metastatic disease to the liver (05/04/15), ? lungs (11/08/16),and brain with extensive metastatic disease at T12 and the lumbar vertebral bodies; MRI of the spine done 06/18/18 also showed extensive infiltration of L3 with a pathologic fracture of the left side of L3and metastatic disease was also noted at the left iliac and sacrum; he begandailypalliative radiation to the T12-S2 levels 7/8/2019and completed the radiation treatment on 07/11/18.  He received his first cycle of FOLFIRI (Folinic Acid, Fluorouracil & Irinotecan) chemotherapy on 08/19/18 but is no longer receiving chemotherapy due to intolerance.  Update in today's call included: discharge from [facility] to [facility] residential hospice facility on 9/30 for end of life care. Allen Glenn admitted via [facility] to [facility] Department early this morning for new onset of severe chest pain and dyspnea. , EMS EKG showed SVT and patient spontaneously converted to sinus rhythm before he reached the emergency department. CT of chest was done which showed ? pneumonia vs extensive metastatic disease. Patient and family wish for Allen Glenn to return to [facility] upon discharge from the emergency department. Allen Glenn was discharged back to [facility] at 12:15pm today.)  Barrington Ellison RN,CCM,CDE [place] Management Coordinator Office Phone [phone] Office Fax [phone]

## 2018-10-02 NOTE — ED Triage Notes (Signed)
Per EMS patient reported SOB and chest pains after he woke up around 0200. EMS patient HR was in the 176's-180's on arrival but converted to ST without any treatment. BP was 170/110.  Has chronic foley use and RT chest port which is not accessed. O2-5L. EMS had 2x unsuccessful iv attempts. A/O X4 currently. Family at bedside.

## 2018-10-02 NOTE — ED Notes (Signed)
Per Hospice RN request, Called social work for consult

## 2018-10-02 NOTE — Progress Notes (Signed)
CSW spoke with pt and family at bedside. At this time is returning to [facility] place hospice where family can speak with Nira Conn regarding other concerns for pt at this time. CSw has updated family, Optometrist and called for transportation back to [place].   At this time there are no further CSW needs. CSW will sign off.    Virgie Dad. Clark Clowdus, MSW, [place] Emergency Department Clinical Social Worker [phone]

## 2018-10-02 NOTE — ED Notes (Signed)
Spoke to [place] [employer] place Therapist, sports for report.

## 2018-10-02 NOTE — ED Notes (Signed)
Hospice care coordinators at bedside. States that [facility] is not able to accept patient at this time.

## 2018-10-02 NOTE — ED Notes (Signed)
[facility] for pt transport

## 2018-10-02 NOTE — ED Notes (Signed)
Patient verbalizes understanding of discharge instructions. Opportunity for questioning and answers were provided. Armband removed by staff, pt discharged from ED.  

## 2018-10-02 NOTE — Progress Notes (Signed)
[facility]) hospital liaison note.  Notified that patient was brought into ED for evaluation of chest pain. Met with patient and family at bedside. Patient is in no apparent distress. He is alert and oriented. He states he wants to return to [facility] to continue with comfort care only.   Informed patient and family that a bed may no longer be available. [facility] does not routinely hold beds for patients that choose to seek aggressive therapies or treatments outside the hospice plan of care.    Notified by [employer] that patient may return to [facility] to work on goals of care and further planning for his end of life needs.   Please call with any hospice related questions or concerns.   Thank you, Farrel Gordon, [facility] Liaison (listed  In [place]) [phone]

## 2018-10-02 NOTE — ED Notes (Signed)
Pt returned to [facility] place per Social work

## 2018-10-03 DIAGNOSIS — J189 Pneumonia, unspecified organism: Secondary | ICD-10-CM | POA: Diagnosis not present

## 2018-10-03 DIAGNOSIS — E1159 Type 2 diabetes mellitus with other circulatory complications: Secondary | ICD-10-CM | POA: Diagnosis not present

## 2018-10-03 DIAGNOSIS — I2510 Atherosclerotic heart disease of native coronary artery without angina pectoris: Secondary | ICD-10-CM | POA: Diagnosis not present

## 2018-10-03 DIAGNOSIS — C7951 Secondary malignant neoplasm of bone: Secondary | ICD-10-CM | POA: Diagnosis not present

## 2018-10-03 DIAGNOSIS — C7931 Secondary malignant neoplasm of brain: Secondary | ICD-10-CM | POA: Diagnosis not present

## 2018-10-03 DIAGNOSIS — I10 Essential (primary) hypertension: Secondary | ICD-10-CM | POA: Diagnosis not present

## 2018-10-03 DIAGNOSIS — C189 Malignant neoplasm of colon, unspecified: Secondary | ICD-10-CM | POA: Diagnosis not present

## 2018-10-03 DIAGNOSIS — C787 Secondary malignant neoplasm of liver and intrahepatic bile duct: Secondary | ICD-10-CM | POA: Diagnosis not present

## 2018-10-03 DIAGNOSIS — C7800 Secondary malignant neoplasm of unspecified lung: Secondary | ICD-10-CM | POA: Diagnosis not present

## 2018-10-04 DIAGNOSIS — I10 Essential (primary) hypertension: Secondary | ICD-10-CM | POA: Diagnosis not present

## 2018-10-04 DIAGNOSIS — C787 Secondary malignant neoplasm of liver and intrahepatic bile duct: Secondary | ICD-10-CM | POA: Diagnosis not present

## 2018-10-04 DIAGNOSIS — I2510 Atherosclerotic heart disease of native coronary artery without angina pectoris: Secondary | ICD-10-CM | POA: Diagnosis not present

## 2018-10-04 DIAGNOSIS — C7800 Secondary malignant neoplasm of unspecified lung: Secondary | ICD-10-CM | POA: Diagnosis not present

## 2018-10-04 DIAGNOSIS — J189 Pneumonia, unspecified organism: Secondary | ICD-10-CM | POA: Diagnosis not present

## 2018-10-04 DIAGNOSIS — C7951 Secondary malignant neoplasm of bone: Secondary | ICD-10-CM | POA: Diagnosis not present

## 2018-10-04 DIAGNOSIS — E1159 Type 2 diabetes mellitus with other circulatory complications: Secondary | ICD-10-CM | POA: Diagnosis not present

## 2018-10-04 DIAGNOSIS — C7931 Secondary malignant neoplasm of brain: Secondary | ICD-10-CM | POA: Diagnosis not present

## 2018-10-04 DIAGNOSIS — C189 Malignant neoplasm of colon, unspecified: Secondary | ICD-10-CM | POA: Diagnosis not present

## 2018-10-05 DIAGNOSIS — C7931 Secondary malignant neoplasm of brain: Secondary | ICD-10-CM | POA: Diagnosis not present

## 2018-10-05 DIAGNOSIS — C7800 Secondary malignant neoplasm of unspecified lung: Secondary | ICD-10-CM | POA: Diagnosis not present

## 2018-10-05 DIAGNOSIS — E1159 Type 2 diabetes mellitus with other circulatory complications: Secondary | ICD-10-CM | POA: Diagnosis not present

## 2018-10-05 DIAGNOSIS — C7951 Secondary malignant neoplasm of bone: Secondary | ICD-10-CM | POA: Diagnosis not present

## 2018-10-05 DIAGNOSIS — C189 Malignant neoplasm of colon, unspecified: Secondary | ICD-10-CM | POA: Diagnosis not present

## 2018-10-05 DIAGNOSIS — I2510 Atherosclerotic heart disease of native coronary artery without angina pectoris: Secondary | ICD-10-CM | POA: Diagnosis not present

## 2018-10-05 DIAGNOSIS — C787 Secondary malignant neoplasm of liver and intrahepatic bile duct: Secondary | ICD-10-CM | POA: Diagnosis not present

## 2018-10-05 DIAGNOSIS — J189 Pneumonia, unspecified organism: Secondary | ICD-10-CM | POA: Diagnosis not present

## 2018-10-05 DIAGNOSIS — I10 Essential (primary) hypertension: Secondary | ICD-10-CM | POA: Diagnosis not present

## 2018-10-06 DIAGNOSIS — E1159 Type 2 diabetes mellitus with other circulatory complications: Secondary | ICD-10-CM | POA: Diagnosis not present

## 2018-10-06 DIAGNOSIS — I10 Essential (primary) hypertension: Secondary | ICD-10-CM | POA: Diagnosis not present

## 2018-10-06 DIAGNOSIS — J189 Pneumonia, unspecified organism: Secondary | ICD-10-CM | POA: Diagnosis not present

## 2018-10-06 DIAGNOSIS — C7800 Secondary malignant neoplasm of unspecified lung: Secondary | ICD-10-CM | POA: Diagnosis not present

## 2018-10-06 DIAGNOSIS — C189 Malignant neoplasm of colon, unspecified: Secondary | ICD-10-CM | POA: Diagnosis not present

## 2018-10-06 DIAGNOSIS — C7931 Secondary malignant neoplasm of brain: Secondary | ICD-10-CM | POA: Diagnosis not present

## 2018-10-06 DIAGNOSIS — C787 Secondary malignant neoplasm of liver and intrahepatic bile duct: Secondary | ICD-10-CM | POA: Diagnosis not present

## 2018-10-06 DIAGNOSIS — C7951 Secondary malignant neoplasm of bone: Secondary | ICD-10-CM | POA: Diagnosis not present

## 2018-10-06 DIAGNOSIS — I2510 Atherosclerotic heart disease of native coronary artery without angina pectoris: Secondary | ICD-10-CM | POA: Diagnosis not present

## 2018-10-07 DIAGNOSIS — C7931 Secondary malignant neoplasm of brain: Secondary | ICD-10-CM | POA: Diagnosis not present

## 2018-10-07 DIAGNOSIS — C189 Malignant neoplasm of colon, unspecified: Secondary | ICD-10-CM | POA: Diagnosis not present

## 2018-10-07 DIAGNOSIS — I10 Essential (primary) hypertension: Secondary | ICD-10-CM | POA: Diagnosis not present

## 2018-10-07 DIAGNOSIS — C787 Secondary malignant neoplasm of liver and intrahepatic bile duct: Secondary | ICD-10-CM | POA: Diagnosis not present

## 2018-10-07 DIAGNOSIS — C7800 Secondary malignant neoplasm of unspecified lung: Secondary | ICD-10-CM | POA: Diagnosis not present

## 2018-10-07 DIAGNOSIS — J189 Pneumonia, unspecified organism: Secondary | ICD-10-CM | POA: Diagnosis not present

## 2018-10-07 DIAGNOSIS — C7951 Secondary malignant neoplasm of bone: Secondary | ICD-10-CM | POA: Diagnosis not present

## 2018-10-07 DIAGNOSIS — E1159 Type 2 diabetes mellitus with other circulatory complications: Secondary | ICD-10-CM | POA: Diagnosis not present

## 2018-10-07 DIAGNOSIS — I2510 Atherosclerotic heart disease of native coronary artery without angina pectoris: Secondary | ICD-10-CM | POA: Diagnosis not present

## 2018-10-08 ENCOUNTER — Ambulatory Visit: Payer: 59 | Admitting: Oncology

## 2018-10-08 DIAGNOSIS — I2510 Atherosclerotic heart disease of native coronary artery without angina pectoris: Secondary | ICD-10-CM | POA: Diagnosis not present

## 2018-10-08 DIAGNOSIS — C189 Malignant neoplasm of colon, unspecified: Secondary | ICD-10-CM | POA: Diagnosis not present

## 2018-10-08 DIAGNOSIS — C7951 Secondary malignant neoplasm of bone: Secondary | ICD-10-CM | POA: Diagnosis not present

## 2018-10-08 DIAGNOSIS — E1159 Type 2 diabetes mellitus with other circulatory complications: Secondary | ICD-10-CM | POA: Diagnosis not present

## 2018-10-08 DIAGNOSIS — C7800 Secondary malignant neoplasm of unspecified lung: Secondary | ICD-10-CM | POA: Diagnosis not present

## 2018-10-08 DIAGNOSIS — I10 Essential (primary) hypertension: Secondary | ICD-10-CM | POA: Diagnosis not present

## 2018-10-08 DIAGNOSIS — C787 Secondary malignant neoplasm of liver and intrahepatic bile duct: Secondary | ICD-10-CM | POA: Diagnosis not present

## 2018-10-08 DIAGNOSIS — C7931 Secondary malignant neoplasm of brain: Secondary | ICD-10-CM | POA: Diagnosis not present

## 2018-10-08 DIAGNOSIS — J189 Pneumonia, unspecified organism: Secondary | ICD-10-CM | POA: Diagnosis not present

## 2018-10-09 ENCOUNTER — Other Ambulatory Visit: Payer: Self-pay | Admitting: Oncology

## 2018-10-09 DIAGNOSIS — E1159 Type 2 diabetes mellitus with other circulatory complications: Secondary | ICD-10-CM | POA: Diagnosis not present

## 2018-10-09 DIAGNOSIS — C7800 Secondary malignant neoplasm of unspecified lung: Secondary | ICD-10-CM | POA: Diagnosis not present

## 2018-10-09 DIAGNOSIS — C189 Malignant neoplasm of colon, unspecified: Secondary | ICD-10-CM | POA: Diagnosis not present

## 2018-10-09 DIAGNOSIS — I10 Essential (primary) hypertension: Secondary | ICD-10-CM | POA: Diagnosis not present

## 2018-10-09 DIAGNOSIS — C787 Secondary malignant neoplasm of liver and intrahepatic bile duct: Secondary | ICD-10-CM | POA: Diagnosis not present

## 2018-10-09 DIAGNOSIS — C7931 Secondary malignant neoplasm of brain: Secondary | ICD-10-CM | POA: Diagnosis not present

## 2018-10-09 DIAGNOSIS — C7951 Secondary malignant neoplasm of bone: Secondary | ICD-10-CM | POA: Diagnosis not present

## 2018-10-09 DIAGNOSIS — I2510 Atherosclerotic heart disease of native coronary artery without angina pectoris: Secondary | ICD-10-CM | POA: Diagnosis not present

## 2018-10-09 DIAGNOSIS — J189 Pneumonia, unspecified organism: Secondary | ICD-10-CM | POA: Diagnosis not present

## 2018-10-10 DIAGNOSIS — C7931 Secondary malignant neoplasm of brain: Secondary | ICD-10-CM | POA: Diagnosis not present

## 2018-10-10 DIAGNOSIS — C7951 Secondary malignant neoplasm of bone: Secondary | ICD-10-CM | POA: Diagnosis not present

## 2018-10-10 DIAGNOSIS — J189 Pneumonia, unspecified organism: Secondary | ICD-10-CM | POA: Diagnosis not present

## 2018-10-10 DIAGNOSIS — E1159 Type 2 diabetes mellitus with other circulatory complications: Secondary | ICD-10-CM | POA: Diagnosis not present

## 2018-10-10 DIAGNOSIS — I10 Essential (primary) hypertension: Secondary | ICD-10-CM | POA: Diagnosis not present

## 2018-10-10 DIAGNOSIS — C787 Secondary malignant neoplasm of liver and intrahepatic bile duct: Secondary | ICD-10-CM | POA: Diagnosis not present

## 2018-10-10 DIAGNOSIS — C7800 Secondary malignant neoplasm of unspecified lung: Secondary | ICD-10-CM | POA: Diagnosis not present

## 2018-10-10 DIAGNOSIS — I2510 Atherosclerotic heart disease of native coronary artery without angina pectoris: Secondary | ICD-10-CM | POA: Diagnosis not present

## 2018-10-10 DIAGNOSIS — C189 Malignant neoplasm of colon, unspecified: Secondary | ICD-10-CM | POA: Diagnosis not present

## 2018-10-11 DIAGNOSIS — I10 Essential (primary) hypertension: Secondary | ICD-10-CM | POA: Diagnosis not present

## 2018-10-11 DIAGNOSIS — C7800 Secondary malignant neoplasm of unspecified lung: Secondary | ICD-10-CM | POA: Diagnosis not present

## 2018-10-11 DIAGNOSIS — I2510 Atherosclerotic heart disease of native coronary artery without angina pectoris: Secondary | ICD-10-CM | POA: Diagnosis not present

## 2018-10-11 DIAGNOSIS — C189 Malignant neoplasm of colon, unspecified: Secondary | ICD-10-CM | POA: Diagnosis not present

## 2018-10-11 DIAGNOSIS — E1159 Type 2 diabetes mellitus with other circulatory complications: Secondary | ICD-10-CM | POA: Diagnosis not present

## 2018-10-11 DIAGNOSIS — C7931 Secondary malignant neoplasm of brain: Secondary | ICD-10-CM | POA: Diagnosis not present

## 2018-10-11 DIAGNOSIS — C787 Secondary malignant neoplasm of liver and intrahepatic bile duct: Secondary | ICD-10-CM | POA: Diagnosis not present

## 2018-10-11 DIAGNOSIS — C7951 Secondary malignant neoplasm of bone: Secondary | ICD-10-CM | POA: Diagnosis not present

## 2018-10-11 DIAGNOSIS — J189 Pneumonia, unspecified organism: Secondary | ICD-10-CM | POA: Diagnosis not present

## 2018-10-12 DIAGNOSIS — I10 Essential (primary) hypertension: Secondary | ICD-10-CM | POA: Diagnosis not present

## 2018-10-12 DIAGNOSIS — C7931 Secondary malignant neoplasm of brain: Secondary | ICD-10-CM | POA: Diagnosis not present

## 2018-10-12 DIAGNOSIS — E1159 Type 2 diabetes mellitus with other circulatory complications: Secondary | ICD-10-CM | POA: Diagnosis not present

## 2018-10-12 DIAGNOSIS — C7800 Secondary malignant neoplasm of unspecified lung: Secondary | ICD-10-CM | POA: Diagnosis not present

## 2018-10-12 DIAGNOSIS — C189 Malignant neoplasm of colon, unspecified: Secondary | ICD-10-CM | POA: Diagnosis not present

## 2018-10-12 DIAGNOSIS — I2510 Atherosclerotic heart disease of native coronary artery without angina pectoris: Secondary | ICD-10-CM | POA: Diagnosis not present

## 2018-10-12 DIAGNOSIS — C787 Secondary malignant neoplasm of liver and intrahepatic bile duct: Secondary | ICD-10-CM | POA: Diagnosis not present

## 2018-10-12 DIAGNOSIS — C7951 Secondary malignant neoplasm of bone: Secondary | ICD-10-CM | POA: Diagnosis not present

## 2018-10-12 DIAGNOSIS — J189 Pneumonia, unspecified organism: Secondary | ICD-10-CM | POA: Diagnosis not present

## 2018-10-13 DIAGNOSIS — C787 Secondary malignant neoplasm of liver and intrahepatic bile duct: Secondary | ICD-10-CM | POA: Diagnosis not present

## 2018-10-13 DIAGNOSIS — I2510 Atherosclerotic heart disease of native coronary artery without angina pectoris: Secondary | ICD-10-CM | POA: Diagnosis not present

## 2018-10-13 DIAGNOSIS — I10 Essential (primary) hypertension: Secondary | ICD-10-CM | POA: Diagnosis not present

## 2018-10-13 DIAGNOSIS — J189 Pneumonia, unspecified organism: Secondary | ICD-10-CM | POA: Diagnosis not present

## 2018-10-13 DIAGNOSIS — C7800 Secondary malignant neoplasm of unspecified lung: Secondary | ICD-10-CM | POA: Diagnosis not present

## 2018-10-13 DIAGNOSIS — E1159 Type 2 diabetes mellitus with other circulatory complications: Secondary | ICD-10-CM | POA: Diagnosis not present

## 2018-10-13 DIAGNOSIS — C7951 Secondary malignant neoplasm of bone: Secondary | ICD-10-CM | POA: Diagnosis not present

## 2018-10-13 DIAGNOSIS — C189 Malignant neoplasm of colon, unspecified: Secondary | ICD-10-CM | POA: Diagnosis not present

## 2018-10-13 DIAGNOSIS — C7931 Secondary malignant neoplasm of brain: Secondary | ICD-10-CM | POA: Diagnosis not present

## 2018-10-14 DIAGNOSIS — C189 Malignant neoplasm of colon, unspecified: Secondary | ICD-10-CM | POA: Diagnosis not present

## 2018-10-14 DIAGNOSIS — I2510 Atherosclerotic heart disease of native coronary artery without angina pectoris: Secondary | ICD-10-CM | POA: Diagnosis not present

## 2018-10-14 DIAGNOSIS — E1159 Type 2 diabetes mellitus with other circulatory complications: Secondary | ICD-10-CM | POA: Diagnosis not present

## 2018-10-14 DIAGNOSIS — J189 Pneumonia, unspecified organism: Secondary | ICD-10-CM | POA: Diagnosis not present

## 2018-10-14 DIAGNOSIS — I10 Essential (primary) hypertension: Secondary | ICD-10-CM | POA: Diagnosis not present

## 2018-10-14 DIAGNOSIS — C787 Secondary malignant neoplasm of liver and intrahepatic bile duct: Secondary | ICD-10-CM | POA: Diagnosis not present

## 2018-10-14 DIAGNOSIS — C7931 Secondary malignant neoplasm of brain: Secondary | ICD-10-CM | POA: Diagnosis not present

## 2018-10-14 DIAGNOSIS — C7951 Secondary malignant neoplasm of bone: Secondary | ICD-10-CM | POA: Diagnosis not present

## 2018-10-14 DIAGNOSIS — C7800 Secondary malignant neoplasm of unspecified lung: Secondary | ICD-10-CM | POA: Diagnosis not present

## 2018-10-15 DIAGNOSIS — I10 Essential (primary) hypertension: Secondary | ICD-10-CM | POA: Diagnosis not present

## 2018-10-15 DIAGNOSIS — C787 Secondary malignant neoplasm of liver and intrahepatic bile duct: Secondary | ICD-10-CM | POA: Diagnosis not present

## 2018-10-15 DIAGNOSIS — I2510 Atherosclerotic heart disease of native coronary artery without angina pectoris: Secondary | ICD-10-CM | POA: Diagnosis not present

## 2018-10-15 DIAGNOSIS — J189 Pneumonia, unspecified organism: Secondary | ICD-10-CM | POA: Diagnosis not present

## 2018-10-15 DIAGNOSIS — C7931 Secondary malignant neoplasm of brain: Secondary | ICD-10-CM | POA: Diagnosis not present

## 2018-10-15 DIAGNOSIS — C7800 Secondary malignant neoplasm of unspecified lung: Secondary | ICD-10-CM | POA: Diagnosis not present

## 2018-10-15 DIAGNOSIS — C7951 Secondary malignant neoplasm of bone: Secondary | ICD-10-CM | POA: Diagnosis not present

## 2018-10-15 DIAGNOSIS — C189 Malignant neoplasm of colon, unspecified: Secondary | ICD-10-CM | POA: Diagnosis not present

## 2018-10-15 DIAGNOSIS — E1159 Type 2 diabetes mellitus with other circulatory complications: Secondary | ICD-10-CM | POA: Diagnosis not present

## 2018-10-16 DIAGNOSIS — C787 Secondary malignant neoplasm of liver and intrahepatic bile duct: Secondary | ICD-10-CM | POA: Diagnosis not present

## 2018-10-16 DIAGNOSIS — J189 Pneumonia, unspecified organism: Secondary | ICD-10-CM | POA: Diagnosis not present

## 2018-10-16 DIAGNOSIS — C7800 Secondary malignant neoplasm of unspecified lung: Secondary | ICD-10-CM | POA: Diagnosis not present

## 2018-10-16 DIAGNOSIS — C189 Malignant neoplasm of colon, unspecified: Secondary | ICD-10-CM | POA: Diagnosis not present

## 2018-10-16 DIAGNOSIS — C7931 Secondary malignant neoplasm of brain: Secondary | ICD-10-CM | POA: Diagnosis not present

## 2018-10-16 DIAGNOSIS — I2510 Atherosclerotic heart disease of native coronary artery without angina pectoris: Secondary | ICD-10-CM | POA: Diagnosis not present

## 2018-10-16 DIAGNOSIS — E1159 Type 2 diabetes mellitus with other circulatory complications: Secondary | ICD-10-CM | POA: Diagnosis not present

## 2018-10-16 DIAGNOSIS — I10 Essential (primary) hypertension: Secondary | ICD-10-CM | POA: Diagnosis not present

## 2018-10-16 DIAGNOSIS — C7951 Secondary malignant neoplasm of bone: Secondary | ICD-10-CM | POA: Diagnosis not present

## 2018-10-17 DIAGNOSIS — I2510 Atherosclerotic heart disease of native coronary artery without angina pectoris: Secondary | ICD-10-CM | POA: Diagnosis not present

## 2018-10-17 DIAGNOSIS — C189 Malignant neoplasm of colon, unspecified: Secondary | ICD-10-CM | POA: Diagnosis not present

## 2018-10-17 DIAGNOSIS — E1159 Type 2 diabetes mellitus with other circulatory complications: Secondary | ICD-10-CM | POA: Diagnosis not present

## 2018-10-17 DIAGNOSIS — J189 Pneumonia, unspecified organism: Secondary | ICD-10-CM | POA: Diagnosis not present

## 2018-10-17 DIAGNOSIS — C7800 Secondary malignant neoplasm of unspecified lung: Secondary | ICD-10-CM | POA: Diagnosis not present

## 2018-10-17 DIAGNOSIS — C787 Secondary malignant neoplasm of liver and intrahepatic bile duct: Secondary | ICD-10-CM | POA: Diagnosis not present

## 2018-10-17 DIAGNOSIS — I10 Essential (primary) hypertension: Secondary | ICD-10-CM | POA: Diagnosis not present

## 2018-10-17 DIAGNOSIS — C7951 Secondary malignant neoplasm of bone: Secondary | ICD-10-CM | POA: Diagnosis not present

## 2018-10-17 DIAGNOSIS — C7931 Secondary malignant neoplasm of brain: Secondary | ICD-10-CM | POA: Diagnosis not present

## 2018-10-18 DIAGNOSIS — I2510 Atherosclerotic heart disease of native coronary artery without angina pectoris: Secondary | ICD-10-CM | POA: Diagnosis not present

## 2018-10-18 DIAGNOSIS — C7800 Secondary malignant neoplasm of unspecified lung: Secondary | ICD-10-CM | POA: Diagnosis not present

## 2018-10-18 DIAGNOSIS — C189 Malignant neoplasm of colon, unspecified: Secondary | ICD-10-CM | POA: Diagnosis not present

## 2018-10-18 DIAGNOSIS — C7951 Secondary malignant neoplasm of bone: Secondary | ICD-10-CM | POA: Diagnosis not present

## 2018-10-18 DIAGNOSIS — C7931 Secondary malignant neoplasm of brain: Secondary | ICD-10-CM | POA: Diagnosis not present

## 2018-10-18 DIAGNOSIS — I10 Essential (primary) hypertension: Secondary | ICD-10-CM | POA: Diagnosis not present

## 2018-10-18 DIAGNOSIS — E1159 Type 2 diabetes mellitus with other circulatory complications: Secondary | ICD-10-CM | POA: Diagnosis not present

## 2018-10-18 DIAGNOSIS — C787 Secondary malignant neoplasm of liver and intrahepatic bile duct: Secondary | ICD-10-CM | POA: Diagnosis not present

## 2018-10-18 DIAGNOSIS — J189 Pneumonia, unspecified organism: Secondary | ICD-10-CM | POA: Diagnosis not present

## 2018-10-19 DIAGNOSIS — C7931 Secondary malignant neoplasm of brain: Secondary | ICD-10-CM | POA: Diagnosis not present

## 2018-10-19 DIAGNOSIS — E1159 Type 2 diabetes mellitus with other circulatory complications: Secondary | ICD-10-CM | POA: Diagnosis not present

## 2018-10-19 DIAGNOSIS — J189 Pneumonia, unspecified organism: Secondary | ICD-10-CM | POA: Diagnosis not present

## 2018-10-19 DIAGNOSIS — I10 Essential (primary) hypertension: Secondary | ICD-10-CM | POA: Diagnosis not present

## 2018-10-19 DIAGNOSIS — C787 Secondary malignant neoplasm of liver and intrahepatic bile duct: Secondary | ICD-10-CM | POA: Diagnosis not present

## 2018-10-19 DIAGNOSIS — I2510 Atherosclerotic heart disease of native coronary artery without angina pectoris: Secondary | ICD-10-CM | POA: Diagnosis not present

## 2018-10-19 DIAGNOSIS — C7951 Secondary malignant neoplasm of bone: Secondary | ICD-10-CM | POA: Diagnosis not present

## 2018-10-19 DIAGNOSIS — C189 Malignant neoplasm of colon, unspecified: Secondary | ICD-10-CM | POA: Diagnosis not present

## 2018-10-19 DIAGNOSIS — C7800 Secondary malignant neoplasm of unspecified lung: Secondary | ICD-10-CM | POA: Diagnosis not present

## 2018-10-20 DIAGNOSIS — E1159 Type 2 diabetes mellitus with other circulatory complications: Secondary | ICD-10-CM | POA: Diagnosis not present

## 2018-10-20 DIAGNOSIS — I10 Essential (primary) hypertension: Secondary | ICD-10-CM | POA: Diagnosis not present

## 2018-10-20 DIAGNOSIS — C7931 Secondary malignant neoplasm of brain: Secondary | ICD-10-CM | POA: Diagnosis not present

## 2018-10-20 DIAGNOSIS — C787 Secondary malignant neoplasm of liver and intrahepatic bile duct: Secondary | ICD-10-CM | POA: Diagnosis not present

## 2018-10-20 DIAGNOSIS — J189 Pneumonia, unspecified organism: Secondary | ICD-10-CM | POA: Diagnosis not present

## 2018-10-20 DIAGNOSIS — C7800 Secondary malignant neoplasm of unspecified lung: Secondary | ICD-10-CM | POA: Diagnosis not present

## 2018-10-20 DIAGNOSIS — C189 Malignant neoplasm of colon, unspecified: Secondary | ICD-10-CM | POA: Diagnosis not present

## 2018-10-20 DIAGNOSIS — I2510 Atherosclerotic heart disease of native coronary artery without angina pectoris: Secondary | ICD-10-CM | POA: Diagnosis not present

## 2018-10-20 DIAGNOSIS — C7951 Secondary malignant neoplasm of bone: Secondary | ICD-10-CM | POA: Diagnosis not present

## 2018-10-21 DIAGNOSIS — I10 Essential (primary) hypertension: Secondary | ICD-10-CM | POA: Diagnosis not present

## 2018-10-21 DIAGNOSIS — I2510 Atherosclerotic heart disease of native coronary artery without angina pectoris: Secondary | ICD-10-CM | POA: Diagnosis not present

## 2018-10-21 DIAGNOSIS — C7931 Secondary malignant neoplasm of brain: Secondary | ICD-10-CM | POA: Diagnosis not present

## 2018-10-21 DIAGNOSIS — C7800 Secondary malignant neoplasm of unspecified lung: Secondary | ICD-10-CM | POA: Diagnosis not present

## 2018-10-21 DIAGNOSIS — C7951 Secondary malignant neoplasm of bone: Secondary | ICD-10-CM | POA: Diagnosis not present

## 2018-10-21 DIAGNOSIS — C787 Secondary malignant neoplasm of liver and intrahepatic bile duct: Secondary | ICD-10-CM | POA: Diagnosis not present

## 2018-10-21 DIAGNOSIS — C189 Malignant neoplasm of colon, unspecified: Secondary | ICD-10-CM | POA: Diagnosis not present

## 2018-10-21 DIAGNOSIS — E1159 Type 2 diabetes mellitus with other circulatory complications: Secondary | ICD-10-CM | POA: Diagnosis not present

## 2018-10-21 DIAGNOSIS — J189 Pneumonia, unspecified organism: Secondary | ICD-10-CM | POA: Diagnosis not present

## 2018-10-22 DIAGNOSIS — C787 Secondary malignant neoplasm of liver and intrahepatic bile duct: Secondary | ICD-10-CM | POA: Diagnosis not present

## 2018-10-22 DIAGNOSIS — C7800 Secondary malignant neoplasm of unspecified lung: Secondary | ICD-10-CM | POA: Diagnosis not present

## 2018-10-22 DIAGNOSIS — J189 Pneumonia, unspecified organism: Secondary | ICD-10-CM | POA: Diagnosis not present

## 2018-10-22 DIAGNOSIS — E1159 Type 2 diabetes mellitus with other circulatory complications: Secondary | ICD-10-CM | POA: Diagnosis not present

## 2018-10-22 DIAGNOSIS — C7951 Secondary malignant neoplasm of bone: Secondary | ICD-10-CM | POA: Diagnosis not present

## 2018-10-22 DIAGNOSIS — I10 Essential (primary) hypertension: Secondary | ICD-10-CM | POA: Diagnosis not present

## 2018-10-22 DIAGNOSIS — C189 Malignant neoplasm of colon, unspecified: Secondary | ICD-10-CM | POA: Diagnosis not present

## 2018-10-22 DIAGNOSIS — I2510 Atherosclerotic heart disease of native coronary artery without angina pectoris: Secondary | ICD-10-CM | POA: Diagnosis not present

## 2018-10-22 DIAGNOSIS — C7931 Secondary malignant neoplasm of brain: Secondary | ICD-10-CM | POA: Diagnosis not present

## 2018-10-23 DIAGNOSIS — C7800 Secondary malignant neoplasm of unspecified lung: Secondary | ICD-10-CM | POA: Diagnosis not present

## 2018-10-23 DIAGNOSIS — I10 Essential (primary) hypertension: Secondary | ICD-10-CM | POA: Diagnosis not present

## 2018-10-23 DIAGNOSIS — I2510 Atherosclerotic heart disease of native coronary artery without angina pectoris: Secondary | ICD-10-CM | POA: Diagnosis not present

## 2018-10-23 DIAGNOSIS — C189 Malignant neoplasm of colon, unspecified: Secondary | ICD-10-CM | POA: Diagnosis not present

## 2018-10-23 DIAGNOSIS — J189 Pneumonia, unspecified organism: Secondary | ICD-10-CM | POA: Diagnosis not present

## 2018-10-23 DIAGNOSIS — C787 Secondary malignant neoplasm of liver and intrahepatic bile duct: Secondary | ICD-10-CM | POA: Diagnosis not present

## 2018-10-23 DIAGNOSIS — C7951 Secondary malignant neoplasm of bone: Secondary | ICD-10-CM | POA: Diagnosis not present

## 2018-10-23 DIAGNOSIS — E1159 Type 2 diabetes mellitus with other circulatory complications: Secondary | ICD-10-CM | POA: Diagnosis not present

## 2018-10-23 DIAGNOSIS — C7931 Secondary malignant neoplasm of brain: Secondary | ICD-10-CM | POA: Diagnosis not present

## 2018-10-24 DIAGNOSIS — I10 Essential (primary) hypertension: Secondary | ICD-10-CM | POA: Diagnosis not present

## 2018-10-24 DIAGNOSIS — C189 Malignant neoplasm of colon, unspecified: Secondary | ICD-10-CM | POA: Diagnosis not present

## 2018-10-24 DIAGNOSIS — C7931 Secondary malignant neoplasm of brain: Secondary | ICD-10-CM | POA: Diagnosis not present

## 2018-10-24 DIAGNOSIS — I2510 Atherosclerotic heart disease of native coronary artery without angina pectoris: Secondary | ICD-10-CM | POA: Diagnosis not present

## 2018-10-24 DIAGNOSIS — C7951 Secondary malignant neoplasm of bone: Secondary | ICD-10-CM | POA: Diagnosis not present

## 2018-10-24 DIAGNOSIS — J189 Pneumonia, unspecified organism: Secondary | ICD-10-CM | POA: Diagnosis not present

## 2018-10-24 DIAGNOSIS — E1159 Type 2 diabetes mellitus with other circulatory complications: Secondary | ICD-10-CM | POA: Diagnosis not present

## 2018-10-24 DIAGNOSIS — C7800 Secondary malignant neoplasm of unspecified lung: Secondary | ICD-10-CM | POA: Diagnosis not present

## 2018-10-24 DIAGNOSIS — C787 Secondary malignant neoplasm of liver and intrahepatic bile duct: Secondary | ICD-10-CM | POA: Diagnosis not present

## 2018-10-25 DIAGNOSIS — C7951 Secondary malignant neoplasm of bone: Secondary | ICD-10-CM | POA: Diagnosis not present

## 2018-10-25 DIAGNOSIS — C787 Secondary malignant neoplasm of liver and intrahepatic bile duct: Secondary | ICD-10-CM | POA: Diagnosis not present

## 2018-10-25 DIAGNOSIS — C189 Malignant neoplasm of colon, unspecified: Secondary | ICD-10-CM | POA: Diagnosis not present

## 2018-10-25 DIAGNOSIS — C7931 Secondary malignant neoplasm of brain: Secondary | ICD-10-CM | POA: Diagnosis not present

## 2018-10-25 DIAGNOSIS — I10 Essential (primary) hypertension: Secondary | ICD-10-CM | POA: Diagnosis not present

## 2018-10-25 DIAGNOSIS — J189 Pneumonia, unspecified organism: Secondary | ICD-10-CM | POA: Diagnosis not present

## 2018-10-25 DIAGNOSIS — C7800 Secondary malignant neoplasm of unspecified lung: Secondary | ICD-10-CM | POA: Diagnosis not present

## 2018-10-25 DIAGNOSIS — I2510 Atherosclerotic heart disease of native coronary artery without angina pectoris: Secondary | ICD-10-CM | POA: Diagnosis not present

## 2018-10-25 DIAGNOSIS — E1159 Type 2 diabetes mellitus with other circulatory complications: Secondary | ICD-10-CM | POA: Diagnosis not present

## 2018-10-26 DIAGNOSIS — E1159 Type 2 diabetes mellitus with other circulatory complications: Secondary | ICD-10-CM | POA: Diagnosis not present

## 2018-10-26 DIAGNOSIS — C189 Malignant neoplasm of colon, unspecified: Secondary | ICD-10-CM | POA: Diagnosis not present

## 2018-10-26 DIAGNOSIS — I2510 Atherosclerotic heart disease of native coronary artery without angina pectoris: Secondary | ICD-10-CM | POA: Diagnosis not present

## 2018-10-26 DIAGNOSIS — C7800 Secondary malignant neoplasm of unspecified lung: Secondary | ICD-10-CM | POA: Diagnosis not present

## 2018-10-26 DIAGNOSIS — I10 Essential (primary) hypertension: Secondary | ICD-10-CM | POA: Diagnosis not present

## 2018-10-26 DIAGNOSIS — C7951 Secondary malignant neoplasm of bone: Secondary | ICD-10-CM | POA: Diagnosis not present

## 2018-10-26 DIAGNOSIS — J189 Pneumonia, unspecified organism: Secondary | ICD-10-CM | POA: Diagnosis not present

## 2018-10-26 DIAGNOSIS — C7931 Secondary malignant neoplasm of brain: Secondary | ICD-10-CM | POA: Diagnosis not present

## 2018-10-26 DIAGNOSIS — C787 Secondary malignant neoplasm of liver and intrahepatic bile duct: Secondary | ICD-10-CM | POA: Diagnosis not present

## 2018-10-27 DIAGNOSIS — C189 Malignant neoplasm of colon, unspecified: Secondary | ICD-10-CM | POA: Diagnosis not present

## 2018-10-27 DIAGNOSIS — J189 Pneumonia, unspecified organism: Secondary | ICD-10-CM | POA: Diagnosis not present

## 2018-10-27 DIAGNOSIS — C7951 Secondary malignant neoplasm of bone: Secondary | ICD-10-CM | POA: Diagnosis not present

## 2018-10-27 DIAGNOSIS — I10 Essential (primary) hypertension: Secondary | ICD-10-CM | POA: Diagnosis not present

## 2018-10-27 DIAGNOSIS — C7931 Secondary malignant neoplasm of brain: Secondary | ICD-10-CM | POA: Diagnosis not present

## 2018-10-27 DIAGNOSIS — C787 Secondary malignant neoplasm of liver and intrahepatic bile duct: Secondary | ICD-10-CM | POA: Diagnosis not present

## 2018-10-27 DIAGNOSIS — C7800 Secondary malignant neoplasm of unspecified lung: Secondary | ICD-10-CM | POA: Diagnosis not present

## 2018-10-27 DIAGNOSIS — E1159 Type 2 diabetes mellitus with other circulatory complications: Secondary | ICD-10-CM | POA: Diagnosis not present

## 2018-10-27 DIAGNOSIS — I2510 Atherosclerotic heart disease of native coronary artery without angina pectoris: Secondary | ICD-10-CM | POA: Diagnosis not present

## 2018-10-28 DIAGNOSIS — J189 Pneumonia, unspecified organism: Secondary | ICD-10-CM | POA: Diagnosis not present

## 2018-10-28 DIAGNOSIS — C7800 Secondary malignant neoplasm of unspecified lung: Secondary | ICD-10-CM | POA: Diagnosis not present

## 2018-10-28 DIAGNOSIS — C787 Secondary malignant neoplasm of liver and intrahepatic bile duct: Secondary | ICD-10-CM | POA: Diagnosis not present

## 2018-10-28 DIAGNOSIS — C7951 Secondary malignant neoplasm of bone: Secondary | ICD-10-CM | POA: Diagnosis not present

## 2018-10-28 DIAGNOSIS — I2510 Atherosclerotic heart disease of native coronary artery without angina pectoris: Secondary | ICD-10-CM | POA: Diagnosis not present

## 2018-10-28 DIAGNOSIS — E1159 Type 2 diabetes mellitus with other circulatory complications: Secondary | ICD-10-CM | POA: Diagnosis not present

## 2018-10-28 DIAGNOSIS — C189 Malignant neoplasm of colon, unspecified: Secondary | ICD-10-CM | POA: Diagnosis not present

## 2018-10-28 DIAGNOSIS — C7931 Secondary malignant neoplasm of brain: Secondary | ICD-10-CM | POA: Diagnosis not present

## 2018-10-28 DIAGNOSIS — I10 Essential (primary) hypertension: Secondary | ICD-10-CM | POA: Diagnosis not present

## 2018-10-29 DIAGNOSIS — I10 Essential (primary) hypertension: Secondary | ICD-10-CM | POA: Diagnosis not present

## 2018-10-29 DIAGNOSIS — I2510 Atherosclerotic heart disease of native coronary artery without angina pectoris: Secondary | ICD-10-CM | POA: Diagnosis not present

## 2018-10-29 DIAGNOSIS — C787 Secondary malignant neoplasm of liver and intrahepatic bile duct: Secondary | ICD-10-CM | POA: Diagnosis not present

## 2018-10-29 DIAGNOSIS — C189 Malignant neoplasm of colon, unspecified: Secondary | ICD-10-CM | POA: Diagnosis not present

## 2018-10-29 DIAGNOSIS — E1159 Type 2 diabetes mellitus with other circulatory complications: Secondary | ICD-10-CM | POA: Diagnosis not present

## 2018-10-29 DIAGNOSIS — C7800 Secondary malignant neoplasm of unspecified lung: Secondary | ICD-10-CM | POA: Diagnosis not present

## 2018-10-29 DIAGNOSIS — C7951 Secondary malignant neoplasm of bone: Secondary | ICD-10-CM | POA: Diagnosis not present

## 2018-10-29 DIAGNOSIS — J189 Pneumonia, unspecified organism: Secondary | ICD-10-CM | POA: Diagnosis not present

## 2018-10-29 DIAGNOSIS — C7931 Secondary malignant neoplasm of brain: Secondary | ICD-10-CM | POA: Diagnosis not present

## 2018-10-30 DIAGNOSIS — C7951 Secondary malignant neoplasm of bone: Secondary | ICD-10-CM | POA: Diagnosis not present

## 2018-10-30 DIAGNOSIS — E1159 Type 2 diabetes mellitus with other circulatory complications: Secondary | ICD-10-CM | POA: Diagnosis not present

## 2018-10-30 DIAGNOSIS — I10 Essential (primary) hypertension: Secondary | ICD-10-CM | POA: Diagnosis not present

## 2018-10-30 DIAGNOSIS — C7931 Secondary malignant neoplasm of brain: Secondary | ICD-10-CM | POA: Diagnosis not present

## 2018-10-30 DIAGNOSIS — C189 Malignant neoplasm of colon, unspecified: Secondary | ICD-10-CM | POA: Diagnosis not present

## 2018-10-30 DIAGNOSIS — J189 Pneumonia, unspecified organism: Secondary | ICD-10-CM | POA: Diagnosis not present

## 2018-10-30 DIAGNOSIS — C7800 Secondary malignant neoplasm of unspecified lung: Secondary | ICD-10-CM | POA: Diagnosis not present

## 2018-10-30 DIAGNOSIS — C787 Secondary malignant neoplasm of liver and intrahepatic bile duct: Secondary | ICD-10-CM | POA: Diagnosis not present

## 2018-10-30 DIAGNOSIS — I2510 Atherosclerotic heart disease of native coronary artery without angina pectoris: Secondary | ICD-10-CM | POA: Diagnosis not present

## 2018-10-31 DIAGNOSIS — C7951 Secondary malignant neoplasm of bone: Secondary | ICD-10-CM | POA: Diagnosis not present

## 2018-10-31 DIAGNOSIS — C787 Secondary malignant neoplasm of liver and intrahepatic bile duct: Secondary | ICD-10-CM | POA: Diagnosis not present

## 2018-10-31 DIAGNOSIS — C7800 Secondary malignant neoplasm of unspecified lung: Secondary | ICD-10-CM | POA: Diagnosis not present

## 2018-10-31 DIAGNOSIS — I10 Essential (primary) hypertension: Secondary | ICD-10-CM | POA: Diagnosis not present

## 2018-10-31 DIAGNOSIS — E1159 Type 2 diabetes mellitus with other circulatory complications: Secondary | ICD-10-CM | POA: Diagnosis not present

## 2018-10-31 DIAGNOSIS — C189 Malignant neoplasm of colon, unspecified: Secondary | ICD-10-CM | POA: Diagnosis not present

## 2018-10-31 DIAGNOSIS — I2510 Atherosclerotic heart disease of native coronary artery without angina pectoris: Secondary | ICD-10-CM | POA: Diagnosis not present

## 2018-10-31 DIAGNOSIS — C7931 Secondary malignant neoplasm of brain: Secondary | ICD-10-CM | POA: Diagnosis not present

## 2018-10-31 DIAGNOSIS — J189 Pneumonia, unspecified organism: Secondary | ICD-10-CM | POA: Diagnosis not present

## 2018-11-01 DIAGNOSIS — I2510 Atherosclerotic heart disease of native coronary artery without angina pectoris: Secondary | ICD-10-CM | POA: Diagnosis not present

## 2018-11-01 DIAGNOSIS — J189 Pneumonia, unspecified organism: Secondary | ICD-10-CM | POA: Diagnosis not present

## 2018-11-01 DIAGNOSIS — C7951 Secondary malignant neoplasm of bone: Secondary | ICD-10-CM | POA: Diagnosis not present

## 2018-11-01 DIAGNOSIS — C189 Malignant neoplasm of colon, unspecified: Secondary | ICD-10-CM | POA: Diagnosis not present

## 2018-11-01 DIAGNOSIS — C7931 Secondary malignant neoplasm of brain: Secondary | ICD-10-CM | POA: Diagnosis not present

## 2018-11-01 DIAGNOSIS — C787 Secondary malignant neoplasm of liver and intrahepatic bile duct: Secondary | ICD-10-CM | POA: Diagnosis not present

## 2018-11-01 DIAGNOSIS — C7800 Secondary malignant neoplasm of unspecified lung: Secondary | ICD-10-CM | POA: Diagnosis not present

## 2018-11-01 DIAGNOSIS — I10 Essential (primary) hypertension: Secondary | ICD-10-CM | POA: Diagnosis not present

## 2018-11-01 DIAGNOSIS — E1159 Type 2 diabetes mellitus with other circulatory complications: Secondary | ICD-10-CM | POA: Diagnosis not present

## 2018-11-02 DIAGNOSIS — C7931 Secondary malignant neoplasm of brain: Secondary | ICD-10-CM | POA: Diagnosis not present

## 2018-11-02 DIAGNOSIS — I2510 Atherosclerotic heart disease of native coronary artery without angina pectoris: Secondary | ICD-10-CM | POA: Diagnosis not present

## 2018-11-02 DIAGNOSIS — J189 Pneumonia, unspecified organism: Secondary | ICD-10-CM | POA: Diagnosis not present

## 2018-11-02 DIAGNOSIS — E1159 Type 2 diabetes mellitus with other circulatory complications: Secondary | ICD-10-CM | POA: Diagnosis not present

## 2018-11-02 DIAGNOSIS — I10 Essential (primary) hypertension: Secondary | ICD-10-CM | POA: Diagnosis not present

## 2018-11-02 DIAGNOSIS — C787 Secondary malignant neoplasm of liver and intrahepatic bile duct: Secondary | ICD-10-CM | POA: Diagnosis not present

## 2018-11-02 DIAGNOSIS — C189 Malignant neoplasm of colon, unspecified: Secondary | ICD-10-CM | POA: Diagnosis not present

## 2018-11-02 DIAGNOSIS — C7800 Secondary malignant neoplasm of unspecified lung: Secondary | ICD-10-CM | POA: Diagnosis not present

## 2018-11-02 DIAGNOSIS — C7951 Secondary malignant neoplasm of bone: Secondary | ICD-10-CM | POA: Diagnosis not present

## 2018-11-03 DIAGNOSIS — C7800 Secondary malignant neoplasm of unspecified lung: Secondary | ICD-10-CM | POA: Diagnosis not present

## 2018-11-03 DIAGNOSIS — E1159 Type 2 diabetes mellitus with other circulatory complications: Secondary | ICD-10-CM | POA: Diagnosis not present

## 2018-11-03 DIAGNOSIS — I2510 Atherosclerotic heart disease of native coronary artery without angina pectoris: Secondary | ICD-10-CM | POA: Diagnosis not present

## 2018-11-03 DIAGNOSIS — C7951 Secondary malignant neoplasm of bone: Secondary | ICD-10-CM | POA: Diagnosis not present

## 2018-11-03 DIAGNOSIS — J189 Pneumonia, unspecified organism: Secondary | ICD-10-CM | POA: Diagnosis not present

## 2018-11-03 DIAGNOSIS — I10 Essential (primary) hypertension: Secondary | ICD-10-CM | POA: Diagnosis not present

## 2018-11-03 DIAGNOSIS — C787 Secondary malignant neoplasm of liver and intrahepatic bile duct: Secondary | ICD-10-CM | POA: Diagnosis not present

## 2018-11-03 DIAGNOSIS — C189 Malignant neoplasm of colon, unspecified: Secondary | ICD-10-CM | POA: Diagnosis not present

## 2018-11-03 DIAGNOSIS — C7931 Secondary malignant neoplasm of brain: Secondary | ICD-10-CM | POA: Diagnosis not present

## 2018-11-04 DIAGNOSIS — E1159 Type 2 diabetes mellitus with other circulatory complications: Secondary | ICD-10-CM | POA: Diagnosis not present

## 2018-11-04 DIAGNOSIS — C7931 Secondary malignant neoplasm of brain: Secondary | ICD-10-CM | POA: Diagnosis not present

## 2018-11-04 DIAGNOSIS — C7800 Secondary malignant neoplasm of unspecified lung: Secondary | ICD-10-CM | POA: Diagnosis not present

## 2018-11-04 DIAGNOSIS — J189 Pneumonia, unspecified organism: Secondary | ICD-10-CM | POA: Diagnosis not present

## 2018-11-04 DIAGNOSIS — I2510 Atherosclerotic heart disease of native coronary artery without angina pectoris: Secondary | ICD-10-CM | POA: Diagnosis not present

## 2018-11-04 DIAGNOSIS — I10 Essential (primary) hypertension: Secondary | ICD-10-CM | POA: Diagnosis not present

## 2018-11-04 DIAGNOSIS — C189 Malignant neoplasm of colon, unspecified: Secondary | ICD-10-CM | POA: Diagnosis not present

## 2018-11-04 DIAGNOSIS — C7951 Secondary malignant neoplasm of bone: Secondary | ICD-10-CM | POA: Diagnosis not present

## 2018-11-04 DIAGNOSIS — C787 Secondary malignant neoplasm of liver and intrahepatic bile duct: Secondary | ICD-10-CM | POA: Diagnosis not present

## 2018-11-05 DIAGNOSIS — C7951 Secondary malignant neoplasm of bone: Secondary | ICD-10-CM | POA: Diagnosis not present

## 2018-11-05 DIAGNOSIS — C787 Secondary malignant neoplasm of liver and intrahepatic bile duct: Secondary | ICD-10-CM | POA: Diagnosis not present

## 2018-11-05 DIAGNOSIS — C189 Malignant neoplasm of colon, unspecified: Secondary | ICD-10-CM | POA: Diagnosis not present

## 2018-11-05 DIAGNOSIS — I10 Essential (primary) hypertension: Secondary | ICD-10-CM | POA: Diagnosis not present

## 2018-11-05 DIAGNOSIS — E1159 Type 2 diabetes mellitus with other circulatory complications: Secondary | ICD-10-CM | POA: Diagnosis not present

## 2018-11-05 DIAGNOSIS — J189 Pneumonia, unspecified organism: Secondary | ICD-10-CM | POA: Diagnosis not present

## 2018-11-05 DIAGNOSIS — C7931 Secondary malignant neoplasm of brain: Secondary | ICD-10-CM | POA: Diagnosis not present

## 2018-11-05 DIAGNOSIS — I2510 Atherosclerotic heart disease of native coronary artery without angina pectoris: Secondary | ICD-10-CM | POA: Diagnosis not present

## 2018-11-05 DIAGNOSIS — C7800 Secondary malignant neoplasm of unspecified lung: Secondary | ICD-10-CM | POA: Diagnosis not present

## 2018-11-06 DIAGNOSIS — I2510 Atherosclerotic heart disease of native coronary artery without angina pectoris: Secondary | ICD-10-CM | POA: Diagnosis not present

## 2018-11-06 DIAGNOSIS — I10 Essential (primary) hypertension: Secondary | ICD-10-CM | POA: Diagnosis not present

## 2018-11-06 DIAGNOSIS — J189 Pneumonia, unspecified organism: Secondary | ICD-10-CM | POA: Diagnosis not present

## 2018-11-06 DIAGNOSIS — E1159 Type 2 diabetes mellitus with other circulatory complications: Secondary | ICD-10-CM | POA: Diagnosis not present

## 2018-11-06 DIAGNOSIS — C7951 Secondary malignant neoplasm of bone: Secondary | ICD-10-CM | POA: Diagnosis not present

## 2018-11-06 DIAGNOSIS — C7931 Secondary malignant neoplasm of brain: Secondary | ICD-10-CM | POA: Diagnosis not present

## 2018-11-06 DIAGNOSIS — C7800 Secondary malignant neoplasm of unspecified lung: Secondary | ICD-10-CM | POA: Diagnosis not present

## 2018-11-06 DIAGNOSIS — C189 Malignant neoplasm of colon, unspecified: Secondary | ICD-10-CM | POA: Diagnosis not present

## 2018-11-06 DIAGNOSIS — C787 Secondary malignant neoplasm of liver and intrahepatic bile duct: Secondary | ICD-10-CM | POA: Diagnosis not present

## 2018-11-07 DIAGNOSIS — C7800 Secondary malignant neoplasm of unspecified lung: Secondary | ICD-10-CM | POA: Diagnosis not present

## 2018-11-07 DIAGNOSIS — C189 Malignant neoplasm of colon, unspecified: Secondary | ICD-10-CM | POA: Diagnosis not present

## 2018-11-07 DIAGNOSIS — J189 Pneumonia, unspecified organism: Secondary | ICD-10-CM | POA: Diagnosis not present

## 2018-11-07 DIAGNOSIS — C7951 Secondary malignant neoplasm of bone: Secondary | ICD-10-CM | POA: Diagnosis not present

## 2018-11-07 DIAGNOSIS — C787 Secondary malignant neoplasm of liver and intrahepatic bile duct: Secondary | ICD-10-CM | POA: Diagnosis not present

## 2018-11-07 DIAGNOSIS — I10 Essential (primary) hypertension: Secondary | ICD-10-CM | POA: Diagnosis not present

## 2018-11-07 DIAGNOSIS — I2510 Atherosclerotic heart disease of native coronary artery without angina pectoris: Secondary | ICD-10-CM | POA: Diagnosis not present

## 2018-11-07 DIAGNOSIS — E1159 Type 2 diabetes mellitus with other circulatory complications: Secondary | ICD-10-CM | POA: Diagnosis not present

## 2018-11-07 DIAGNOSIS — C7931 Secondary malignant neoplasm of brain: Secondary | ICD-10-CM | POA: Diagnosis not present

## 2018-11-08 DIAGNOSIS — C7951 Secondary malignant neoplasm of bone: Secondary | ICD-10-CM | POA: Diagnosis not present

## 2018-11-08 DIAGNOSIS — C7800 Secondary malignant neoplasm of unspecified lung: Secondary | ICD-10-CM | POA: Diagnosis not present

## 2018-11-08 DIAGNOSIS — I2510 Atherosclerotic heart disease of native coronary artery without angina pectoris: Secondary | ICD-10-CM | POA: Diagnosis not present

## 2018-11-08 DIAGNOSIS — E1159 Type 2 diabetes mellitus with other circulatory complications: Secondary | ICD-10-CM | POA: Diagnosis not present

## 2018-11-08 DIAGNOSIS — I10 Essential (primary) hypertension: Secondary | ICD-10-CM | POA: Diagnosis not present

## 2018-11-08 DIAGNOSIS — C189 Malignant neoplasm of colon, unspecified: Secondary | ICD-10-CM | POA: Diagnosis not present

## 2018-11-08 DIAGNOSIS — J189 Pneumonia, unspecified organism: Secondary | ICD-10-CM | POA: Diagnosis not present

## 2018-11-08 DIAGNOSIS — C787 Secondary malignant neoplasm of liver and intrahepatic bile duct: Secondary | ICD-10-CM | POA: Diagnosis not present

## 2018-11-08 DIAGNOSIS — C7931 Secondary malignant neoplasm of brain: Secondary | ICD-10-CM | POA: Diagnosis not present

## 2018-11-09 DIAGNOSIS — I10 Essential (primary) hypertension: Secondary | ICD-10-CM | POA: Diagnosis not present

## 2018-11-09 DIAGNOSIS — C7951 Secondary malignant neoplasm of bone: Secondary | ICD-10-CM | POA: Diagnosis not present

## 2018-11-09 DIAGNOSIS — E1159 Type 2 diabetes mellitus with other circulatory complications: Secondary | ICD-10-CM | POA: Diagnosis not present

## 2018-11-09 DIAGNOSIS — C189 Malignant neoplasm of colon, unspecified: Secondary | ICD-10-CM | POA: Diagnosis not present

## 2018-11-09 DIAGNOSIS — I2510 Atherosclerotic heart disease of native coronary artery without angina pectoris: Secondary | ICD-10-CM | POA: Diagnosis not present

## 2018-11-09 DIAGNOSIS — C7800 Secondary malignant neoplasm of unspecified lung: Secondary | ICD-10-CM | POA: Diagnosis not present

## 2018-11-09 DIAGNOSIS — C7931 Secondary malignant neoplasm of brain: Secondary | ICD-10-CM | POA: Diagnosis not present

## 2018-11-09 DIAGNOSIS — J189 Pneumonia, unspecified organism: Secondary | ICD-10-CM | POA: Diagnosis not present

## 2018-11-09 DIAGNOSIS — C787 Secondary malignant neoplasm of liver and intrahepatic bile duct: Secondary | ICD-10-CM | POA: Diagnosis not present

## 2018-11-23 DEATH — deceased

## 2019-08-02 IMAGING — DX DG CHEST 1V PORT
1 series · 1 of 1 positions shown · non-contrast
Comparison: 09/04/2018

CLINICAL DATA: Shortness of Breath

EXAM:
PORTABLE CHEST 1 VIEW

[chest ap]
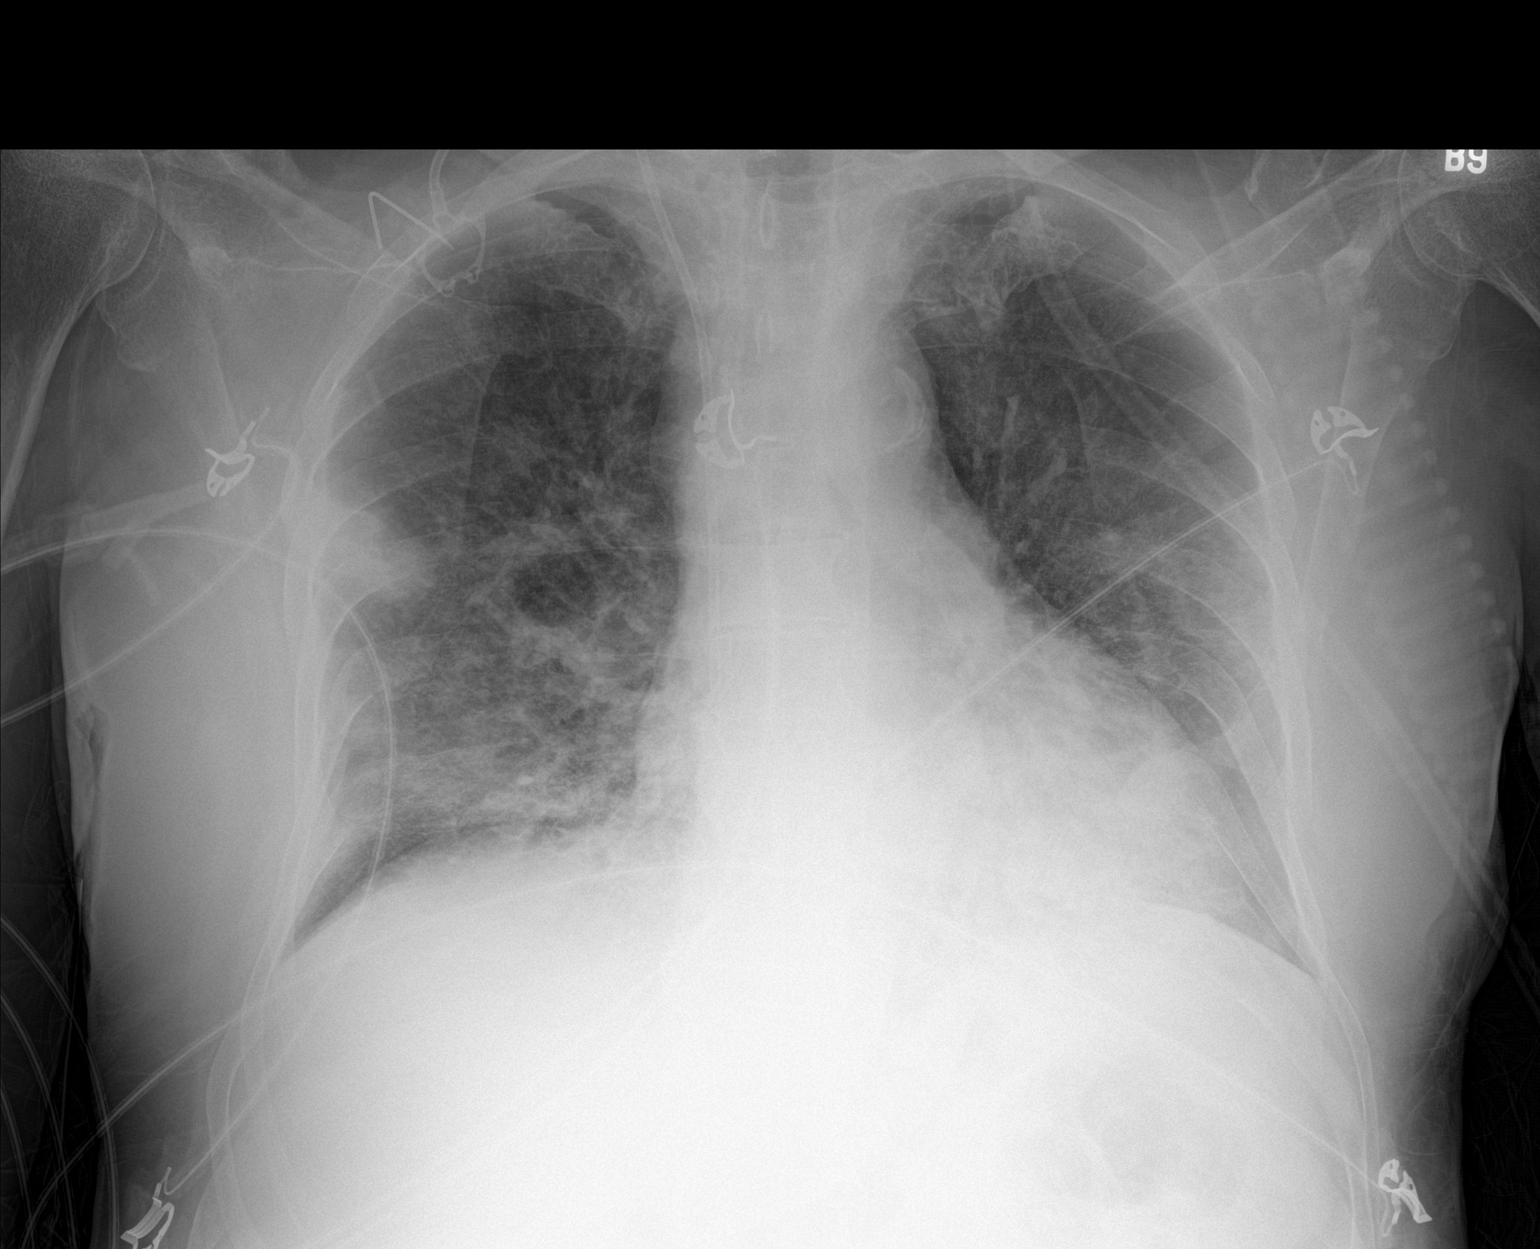

[1 of 1 positions shown; findings below may reference images not displayed]

FINDINGS: Right Port-A-Cath remains in place, unchanged. Bilateral airspace
opacities slightly improved since prior study, particularly on the
left. Continued patchy opacities in the mid and right lower lung and
left lower lung. Mild cardiomegaly. No effusions or acute bony
abnormality.
IMPRESSION: Patchy mid and lower lung bilateral airspace opacities. Overall,
airspace disease slightly improved since prior study.

## 2019-08-03 IMAGING — DX DG CHEST 1V PORT
1 series · 1 of 1 positions shown · non-contrast
Comparison: 09/05/2018

CLINICAL DATA: Shortness of breath

EXAM:
PORTABLE CHEST 1 VIEW

[chest ap]
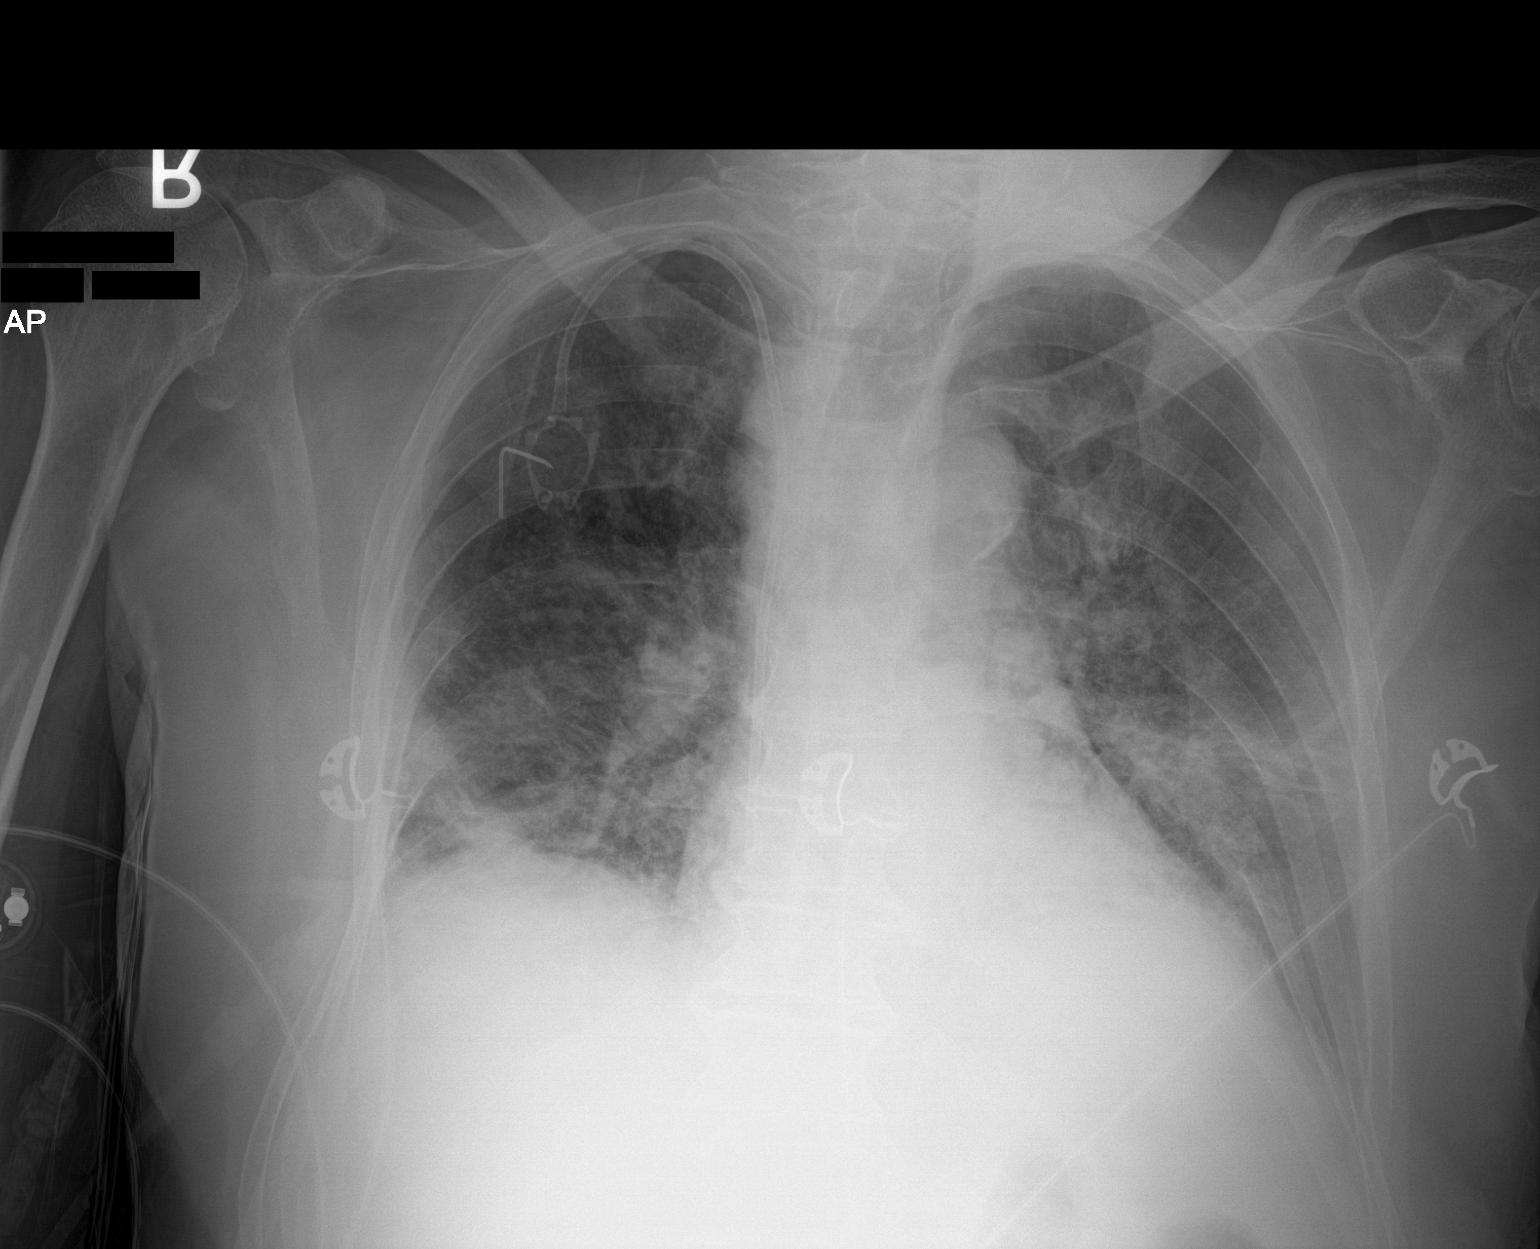

[1 of 1 positions shown; findings below may reference images not displayed]

FINDINGS: Cardiac shadow is stable. Right chest wall port is again seen.
Patchy infiltrates in the mid and lower lung fields bilaterally are
again identified and stable. No sizable effusion is seen. No acute
bony abnormality is noted.
IMPRESSION: Stable bilateral infiltrates
